# Patient Record
Sex: Female | Born: 1935
Health system: Southern US, Community
[De-identification: ages and names within clinical notes are randomized; demographics above are authoritative.]

## PROBLEM LIST (undated history)

## (undated) ENCOUNTER — Emergency Department (HOSPITAL_BASED_OUTPATIENT_CLINIC_OR_DEPARTMENT_OTHER)

## (undated) DIAGNOSIS — Z8701 Personal history of pneumonia (recurrent): Secondary | ICD-10-CM

## (undated) DIAGNOSIS — M545 Low back pain, unspecified: Secondary | ICD-10-CM

## (undated) DIAGNOSIS — Z800 Family history of malignant neoplasm of digestive organs: Secondary | ICD-10-CM

## (undated) DIAGNOSIS — H409 Unspecified glaucoma: Secondary | ICD-10-CM

## (undated) DIAGNOSIS — D126 Benign neoplasm of colon, unspecified: Secondary | ICD-10-CM

## (undated) DIAGNOSIS — J189 Pneumonia, unspecified organism: Secondary | ICD-10-CM

## (undated) DIAGNOSIS — I499 Cardiac arrhythmia, unspecified: Secondary | ICD-10-CM

## (undated) DIAGNOSIS — I2510 Atherosclerotic heart disease of native coronary artery without angina pectoris: Secondary | ICD-10-CM

## (undated) DIAGNOSIS — M1990 Unspecified osteoarthritis, unspecified site: Secondary | ICD-10-CM

## (undated) DIAGNOSIS — E7800 Pure hypercholesterolemia, unspecified: Secondary | ICD-10-CM

## (undated) DIAGNOSIS — D649 Anemia, unspecified: Secondary | ICD-10-CM

## (undated) DIAGNOSIS — M75101 Unspecified rotator cuff tear or rupture of right shoulder, not specified as traumatic: Secondary | ICD-10-CM

## (undated) DIAGNOSIS — A498 Other bacterial infections of unspecified site: Secondary | ICD-10-CM

## (undated) DIAGNOSIS — Z808 Family history of malignant neoplasm of other organs or systems: Secondary | ICD-10-CM

## (undated) DIAGNOSIS — H269 Unspecified cataract: Secondary | ICD-10-CM

## (undated) DIAGNOSIS — G709 Myoneural disorder, unspecified: Secondary | ICD-10-CM

## (undated) DIAGNOSIS — J471 Bronchiectasis with (acute) exacerbation: Secondary | ICD-10-CM

## (undated) DIAGNOSIS — M8580 Other specified disorders of bone density and structure, unspecified site: Secondary | ICD-10-CM

## (undated) DIAGNOSIS — I10 Essential (primary) hypertension: Secondary | ICD-10-CM

## (undated) DIAGNOSIS — K5730 Diverticulosis of large intestine without perforation or abscess without bleeding: Secondary | ICD-10-CM

## (undated) DIAGNOSIS — M4800 Spinal stenosis, site unspecified: Secondary | ICD-10-CM

## (undated) DIAGNOSIS — C50912 Malignant neoplasm of unspecified site of left female breast: Secondary | ICD-10-CM

## (undated) DIAGNOSIS — Z8049 Family history of malignant neoplasm of other genital organs: Secondary | ICD-10-CM

## (undated) DIAGNOSIS — I4891 Unspecified atrial fibrillation: Secondary | ICD-10-CM

## (undated) DIAGNOSIS — J449 Chronic obstructive pulmonary disease, unspecified: Secondary | ICD-10-CM

## (undated) DIAGNOSIS — M797 Fibromyalgia: Secondary | ICD-10-CM

## (undated) DIAGNOSIS — K219 Gastro-esophageal reflux disease without esophagitis: Secondary | ICD-10-CM

## (undated) DIAGNOSIS — C50919 Malignant neoplasm of unspecified site of unspecified female breast: Secondary | ICD-10-CM

## (undated) HISTORY — DX: Family history of malignant neoplasm of other organs or systems: Z80.8

## (undated) HISTORY — DX: Unspecified glaucoma: H40.9

## (undated) HISTORY — DX: Myoneural disorder, unspecified: G70.9

## (undated) HISTORY — DX: Other specified disorders of bone density and structure, unspecified site: M85.80

## (undated) HISTORY — DX: Pure hypercholesterolemia, unspecified: E78.00

## (undated) HISTORY — DX: Unspecified osteoarthritis, unspecified site: M19.90

## (undated) HISTORY — DX: Fibromyalgia: M79.7

## (undated) HISTORY — DX: Unspecified rotator cuff tear or rupture of right shoulder, not specified as traumatic: M75.101

## (undated) HISTORY — DX: Spinal stenosis, site unspecified: M48.00

## (undated) HISTORY — DX: Benign neoplasm of colon, unspecified: D12.6

## (undated) HISTORY — DX: Family history of malignant neoplasm of other genital organs: Z80.49

## (undated) HISTORY — DX: Diverticulosis of large intestine without perforation or abscess without bleeding: K57.30

## (undated) HISTORY — DX: Personal history of pneumonia (recurrent): Z87.01

## (undated) HISTORY — DX: Unspecified cataract: H26.9

## (undated) HISTORY — PX: TONSILLECTOMY: SUR1361

## (undated) HISTORY — DX: Atherosclerotic heart disease of native coronary artery without angina pectoris: I25.10

## (undated) HISTORY — DX: Unspecified atrial fibrillation: I48.91

## (undated) HISTORY — DX: Anemia, unspecified: D64.9

## (undated) HISTORY — DX: Gastro-esophageal reflux disease without esophagitis: K21.9

## (undated) HISTORY — PX: CATARACT EXTRACTION: SUR2

## (undated) HISTORY — DX: Family history of malignant neoplasm of digestive organs: Z80.0

## (undated) HISTORY — DX: Low back pain, unspecified: M54.50

## (undated) HISTORY — PX: POLYPECTOMY: SHX149

## (undated) HISTORY — PX: COLONOSCOPY: SHX174

## (undated) HISTORY — DX: Other bacterial infections of unspecified site: A49.8

## (undated) HISTORY — DX: Low back pain: M54.5

## (undated) HISTORY — DX: Bronchiectasis with (acute) exacerbation: J47.1

## (undated) HISTORY — DX: Malignant neoplasm of unspecified site of unspecified female breast: C50.919

---

## 1966-02-23 HISTORY — PX: LUNG BIOPSY: SHX232

## 1992-06-23 HISTORY — PX: OTHER SURGICAL HISTORY: SHX169

## 1997-09-24 ENCOUNTER — Other Ambulatory Visit: Admission: RE | Admit: 1997-09-24 | Discharge: 1997-09-24 | Payer: Self-pay | Admitting: *Deleted

## 1998-10-02 ENCOUNTER — Other Ambulatory Visit: Admission: RE | Admit: 1998-10-02 | Discharge: 1998-10-02 | Payer: Self-pay | Admitting: Obstetrics and Gynecology

## 1998-11-28 ENCOUNTER — Ambulatory Visit (HOSPITAL_COMMUNITY): Admission: RE | Admit: 1998-11-28 | Discharge: 1998-11-28 | Payer: Self-pay | Admitting: Pulmonary Disease

## 1998-11-28 ENCOUNTER — Encounter: Payer: Self-pay | Admitting: Pulmonary Disease

## 1999-10-01 ENCOUNTER — Other Ambulatory Visit: Admission: RE | Admit: 1999-10-01 | Discharge: 1999-10-01 | Payer: Self-pay | Admitting: Obstetrics and Gynecology

## 2000-08-31 ENCOUNTER — Ambulatory Visit (HOSPITAL_COMMUNITY): Admission: RE | Admit: 2000-08-31 | Discharge: 2000-08-31 | Payer: Self-pay | Admitting: Oncology

## 2000-08-31 ENCOUNTER — Encounter: Payer: Self-pay | Admitting: Oncology

## 2000-09-24 ENCOUNTER — Other Ambulatory Visit: Admission: RE | Admit: 2000-09-24 | Discharge: 2000-09-24 | Payer: Self-pay | Admitting: Obstetrics and Gynecology

## 2001-09-28 ENCOUNTER — Other Ambulatory Visit: Admission: RE | Admit: 2001-09-28 | Discharge: 2001-09-28 | Payer: Self-pay | Admitting: Obstetrics and Gynecology

## 2002-06-01 ENCOUNTER — Encounter: Payer: Self-pay | Admitting: Pulmonary Disease

## 2002-06-01 ENCOUNTER — Ambulatory Visit (HOSPITAL_COMMUNITY): Admission: RE | Admit: 2002-06-01 | Discharge: 2002-06-01 | Payer: Self-pay | Admitting: Pulmonary Disease

## 2002-06-06 ENCOUNTER — Ambulatory Visit: Admission: RE | Admit: 2002-06-06 | Discharge: 2002-06-06 | Payer: Self-pay | Admitting: Pulmonary Disease

## 2002-06-06 ENCOUNTER — Encounter (INDEPENDENT_AMBULATORY_CARE_PROVIDER_SITE_OTHER): Payer: Self-pay | Admitting: Specialist

## 2002-10-10 ENCOUNTER — Other Ambulatory Visit: Admission: RE | Admit: 2002-10-10 | Discharge: 2002-10-10 | Payer: Self-pay | Admitting: Obstetrics and Gynecology

## 2003-01-02 ENCOUNTER — Ambulatory Visit (HOSPITAL_COMMUNITY): Admission: RE | Admit: 2003-01-02 | Discharge: 2003-01-02 | Payer: Self-pay | Admitting: *Deleted

## 2003-06-01 ENCOUNTER — Ambulatory Visit: Admission: RE | Admit: 2003-06-01 | Discharge: 2003-06-01 | Payer: Self-pay | Admitting: Adult Health

## 2003-10-12 ENCOUNTER — Other Ambulatory Visit: Admission: RE | Admit: 2003-10-12 | Discharge: 2003-10-12 | Payer: Self-pay | Admitting: Obstetrics and Gynecology

## 2004-01-09 ENCOUNTER — Ambulatory Visit: Payer: Self-pay | Admitting: Pulmonary Disease

## 2004-01-29 ENCOUNTER — Ambulatory Visit: Payer: Self-pay | Admitting: Pulmonary Disease

## 2004-02-26 ENCOUNTER — Ambulatory Visit: Payer: Self-pay | Admitting: Pulmonary Disease

## 2004-03-07 ENCOUNTER — Ambulatory Visit: Payer: Self-pay | Admitting: Internal Medicine

## 2004-06-02 ENCOUNTER — Ambulatory Visit: Payer: Self-pay | Admitting: Pulmonary Disease

## 2004-07-25 ENCOUNTER — Ambulatory Visit: Payer: Self-pay | Admitting: Adult Health

## 2004-08-13 ENCOUNTER — Ambulatory Visit: Payer: Self-pay | Admitting: Pulmonary Disease

## 2004-08-19 ENCOUNTER — Ambulatory Visit: Payer: Self-pay | Admitting: Cardiology

## 2004-10-13 ENCOUNTER — Other Ambulatory Visit: Admission: RE | Admit: 2004-10-13 | Discharge: 2004-10-13 | Payer: Self-pay | Admitting: Obstetrics and Gynecology

## 2004-11-12 ENCOUNTER — Ambulatory Visit: Payer: Self-pay | Admitting: Pulmonary Disease

## 2004-11-17 ENCOUNTER — Ambulatory Visit: Payer: Self-pay | Admitting: Pulmonary Disease

## 2005-01-21 ENCOUNTER — Ambulatory Visit: Payer: Self-pay | Admitting: Pulmonary Disease

## 2005-03-10 ENCOUNTER — Ambulatory Visit: Payer: Self-pay | Admitting: Pulmonary Disease

## 2005-08-25 ENCOUNTER — Ambulatory Visit: Payer: Self-pay | Admitting: Pulmonary Disease

## 2005-08-31 ENCOUNTER — Ambulatory Visit: Payer: Self-pay | Admitting: Pulmonary Disease

## 2005-10-16 ENCOUNTER — Other Ambulatory Visit: Admission: RE | Admit: 2005-10-16 | Discharge: 2005-10-16 | Payer: Self-pay | Admitting: Obstetrics and Gynecology

## 2005-12-08 ENCOUNTER — Ambulatory Visit: Payer: Self-pay | Admitting: Pulmonary Disease

## 2006-03-03 ENCOUNTER — Ambulatory Visit: Payer: Self-pay | Admitting: Pulmonary Disease

## 2006-07-05 ENCOUNTER — Ambulatory Visit: Payer: Self-pay | Admitting: Pulmonary Disease

## 2006-07-05 LAB — CONVERTED CEMR LAB
Albumin: 3.8 g/dL (ref 3.5–5.2)
BUN: 11 mg/dL (ref 6–23)
Basophils Absolute: 0.1 10*3/uL (ref 0.0–0.1)
Basophils Relative: 0.9 % (ref 0.0–1.0)
CO2: 32 meq/L (ref 19–32)
Calcium: 9.5 mg/dL (ref 8.4–10.5)
Chloride: 103 meq/L (ref 96–112)
Creatinine, Ser: 0.5 mg/dL (ref 0.4–1.2)
Eosinophils Absolute: 0.2 10*3/uL (ref 0.0–0.6)
Eosinophils Relative: 2.7 % (ref 0.0–5.0)
GFR calc Af Amer: 156 mL/min
GFR calc non Af Amer: 129 mL/min
Glucose, Bld: 82 mg/dL (ref 70–99)
HCT: 38.8 % (ref 36.0–46.0)
Hemoglobin: 13.4 g/dL (ref 12.0–15.0)
Lymphocytes Relative: 23.1 % (ref 12.0–46.0)
MCHC: 34.5 g/dL (ref 30.0–36.0)
MCV: 95.7 fL (ref 78.0–100.0)
Monocytes Absolute: 0.5 10*3/uL (ref 0.2–0.7)
Monocytes Relative: 8.2 % (ref 3.0–11.0)
Neutro Abs: 3.9 10*3/uL (ref 1.4–7.7)
Neutrophils Relative %: 65.1 % (ref 43.0–77.0)
Phosphorus: 3.9 mg/dL (ref 2.3–4.6)
Platelets: 238 10*3/uL (ref 150–400)
Potassium: 4.2 meq/L (ref 3.5–5.1)
RBC: 4.06 M/uL (ref 3.87–5.11)
RDW: 12.3 % (ref 11.5–14.6)
Sed Rate: 39 mm/hr — ABNORMAL HIGH (ref 0–25)
Sodium: 138 meq/L (ref 135–145)
WBC: 6.1 10*3/uL (ref 4.5–10.5)

## 2006-07-06 ENCOUNTER — Encounter: Payer: Self-pay | Admitting: Pulmonary Disease

## 2006-09-01 ENCOUNTER — Ambulatory Visit: Payer: Self-pay | Admitting: Pulmonary Disease

## 2006-11-03 ENCOUNTER — Ambulatory Visit: Payer: Self-pay | Admitting: Pulmonary Disease

## 2006-12-08 ENCOUNTER — Ambulatory Visit: Payer: Self-pay | Admitting: Pulmonary Disease

## 2007-03-04 DIAGNOSIS — D649 Anemia, unspecified: Secondary | ICD-10-CM | POA: Insufficient documentation

## 2007-03-04 DIAGNOSIS — J309 Allergic rhinitis, unspecified: Secondary | ICD-10-CM | POA: Insufficient documentation

## 2007-03-04 DIAGNOSIS — M1990 Unspecified osteoarthritis, unspecified site: Secondary | ICD-10-CM | POA: Insufficient documentation

## 2007-03-04 DIAGNOSIS — J479 Bronchiectasis, uncomplicated: Secondary | ICD-10-CM | POA: Insufficient documentation

## 2007-03-07 ENCOUNTER — Ambulatory Visit: Payer: Self-pay | Admitting: Pulmonary Disease

## 2007-03-07 DIAGNOSIS — M899 Disorder of bone, unspecified: Secondary | ICD-10-CM | POA: Insufficient documentation

## 2007-03-07 DIAGNOSIS — M67919 Unspecified disorder of synovium and tendon, unspecified shoulder: Secondary | ICD-10-CM | POA: Insufficient documentation

## 2007-03-07 DIAGNOSIS — C50919 Malignant neoplasm of unspecified site of unspecified female breast: Secondary | ICD-10-CM | POA: Insufficient documentation

## 2007-03-07 DIAGNOSIS — M949 Disorder of cartilage, unspecified: Secondary | ICD-10-CM

## 2007-03-07 DIAGNOSIS — M719 Bursopathy, unspecified: Secondary | ICD-10-CM

## 2007-03-07 DIAGNOSIS — J189 Pneumonia, unspecified organism: Secondary | ICD-10-CM | POA: Insufficient documentation

## 2007-03-07 DIAGNOSIS — K5730 Diverticulosis of large intestine without perforation or abscess without bleeding: Secondary | ICD-10-CM | POA: Insufficient documentation

## 2007-03-18 ENCOUNTER — Ambulatory Visit: Payer: Self-pay | Admitting: Pulmonary Disease

## 2007-03-18 DIAGNOSIS — J479 Bronchiectasis, uncomplicated: Secondary | ICD-10-CM | POA: Insufficient documentation

## 2007-03-18 DIAGNOSIS — R042 Hemoptysis: Secondary | ICD-10-CM | POA: Insufficient documentation

## 2007-03-18 DIAGNOSIS — J471 Bronchiectasis with (acute) exacerbation: Secondary | ICD-10-CM | POA: Insufficient documentation

## 2007-03-21 ENCOUNTER — Encounter: Payer: Self-pay | Admitting: Pulmonary Disease

## 2007-03-23 ENCOUNTER — Telehealth: Payer: Self-pay | Admitting: Adult Health

## 2007-05-02 ENCOUNTER — Telehealth (INDEPENDENT_AMBULATORY_CARE_PROVIDER_SITE_OTHER): Payer: Self-pay | Admitting: *Deleted

## 2007-06-28 ENCOUNTER — Telehealth: Payer: Self-pay | Admitting: Pulmonary Disease

## 2007-06-29 ENCOUNTER — Ambulatory Visit: Payer: Self-pay | Admitting: Pulmonary Disease

## 2007-06-29 LAB — CONVERTED CEMR LAB: Vit D, 1,25-Dihydroxy: 39 (ref 30–89)

## 2007-07-06 ENCOUNTER — Ambulatory Visit: Payer: Self-pay | Admitting: Pulmonary Disease

## 2007-07-06 LAB — CONVERTED CEMR LAB
ALT: 20 units/L (ref 0–35)
AST: 23 units/L (ref 0–37)
Albumin: 3.7 g/dL (ref 3.5–5.2)
Alkaline Phosphatase: 56 units/L (ref 39–117)
BUN: 11 mg/dL (ref 6–23)
Basophils Absolute: 0 10*3/uL (ref 0.0–0.1)
Basophils Relative: 0.9 % (ref 0.0–1.0)
Bilirubin, Direct: 0.1 mg/dL (ref 0.0–0.3)
CO2: 32 meq/L (ref 19–32)
Calcium: 9.2 mg/dL (ref 8.4–10.5)
Chloride: 105 meq/L (ref 96–112)
Cholesterol: 178 mg/dL (ref 0–200)
Creatinine, Ser: 0.6 mg/dL (ref 0.4–1.2)
Eosinophils Absolute: 0.2 10*3/uL (ref 0.0–0.7)
Eosinophils Relative: 4.8 % (ref 0.0–5.0)
GFR calc Af Amer: 126 mL/min
GFR calc non Af Amer: 104 mL/min
Glucose, Bld: 91 mg/dL (ref 70–99)
HCT: 39.2 % (ref 36.0–46.0)
HDL: 47.4 mg/dL (ref >39.0)
Hemoglobin: 13.6 g/dL (ref 12.0–15.0)
LDL Cholesterol: 116 mg/dL — ABNORMAL HIGH (ref 0–99)
Lymphocytes Relative: 26.1 % (ref 12.0–46.0)
MCHC: 34.6 g/dL (ref 30.0–36.0)
MCV: 96.1 fL (ref 78.0–100.0)
Monocytes Absolute: 0.4 10*3/uL (ref 0.1–1.0)
Monocytes Relative: 8.9 % (ref 3.0–12.0)
Neutro Abs: 2.4 10*3/uL (ref 1.4–7.7)
Neutrophils Relative %: 59.3 % (ref 43.0–77.0)
Platelets: 228 10*3/uL (ref 150–400)
Potassium: 4.8 meq/L (ref 3.5–5.1)
RBC: 4.08 M/uL (ref 3.87–5.11)
RDW: 11.8 % (ref 11.5–14.6)
Sed Rate: 35 mm/hr — ABNORMAL HIGH (ref 0–22)
Sodium: 139 meq/L (ref 135–145)
TSH: 4.66 microintl units/mL (ref 0.35–5.50)
Total Bilirubin: 1.1 mg/dL (ref 0.3–1.2)
Total CHOL/HDL Ratio: 3.8
Total Protein: 7.1 g/dL (ref 6.0–8.3)
Triglycerides: 73 mg/dL (ref 0–149)
VLDL: 15 mg/dL (ref 0–40)
WBC: 4.1 10*3/uL — ABNORMAL LOW (ref 4.5–10.5)

## 2007-11-07 ENCOUNTER — Ambulatory Visit: Payer: Self-pay | Admitting: Pulmonary Disease

## 2007-11-30 ENCOUNTER — Ambulatory Visit: Payer: Self-pay | Admitting: Pulmonary Disease

## 2008-02-29 ENCOUNTER — Encounter: Payer: Self-pay | Admitting: Pulmonary Disease

## 2008-03-14 ENCOUNTER — Encounter: Payer: Self-pay | Admitting: Pulmonary Disease

## 2008-03-26 ENCOUNTER — Encounter: Payer: Self-pay | Admitting: Pulmonary Disease

## 2008-03-28 ENCOUNTER — Encounter: Payer: Self-pay | Admitting: Pulmonary Disease

## 2008-05-07 ENCOUNTER — Ambulatory Visit: Payer: Self-pay | Admitting: Pulmonary Disease

## 2008-05-07 DIAGNOSIS — M545 Low back pain, unspecified: Secondary | ICD-10-CM | POA: Insufficient documentation

## 2008-09-25 ENCOUNTER — Ambulatory Visit: Payer: Self-pay | Admitting: Pulmonary Disease

## 2008-09-25 ENCOUNTER — Telehealth: Payer: Self-pay | Admitting: Pulmonary Disease

## 2008-09-26 ENCOUNTER — Encounter: Payer: Self-pay | Admitting: Pulmonary Disease

## 2008-10-30 ENCOUNTER — Telehealth: Payer: Self-pay | Admitting: Pulmonary Disease

## 2008-10-31 ENCOUNTER — Ambulatory Visit: Payer: Self-pay | Admitting: Pulmonary Disease

## 2008-11-06 ENCOUNTER — Ambulatory Visit: Payer: Self-pay | Admitting: Pulmonary Disease

## 2008-11-18 LAB — CONVERTED CEMR LAB
ALT: 17 units/L (ref 0–35)
AST: 28 units/L (ref 0–37)
Albumin: 3.7 g/dL (ref 3.5–5.2)
Alkaline Phosphatase: 60 units/L (ref 39–117)
BUN: 11 mg/dL (ref 6–23)
Basophils Absolute: 0 10*3/uL (ref 0.0–0.1)
Basophils Relative: 0.4 % (ref 0.0–3.0)
Bilirubin, Direct: 0.1 mg/dL (ref 0.0–0.3)
CO2: 31 meq/L (ref 19–32)
Calcium: 9.2 mg/dL (ref 8.4–10.5)
Chloride: 103 meq/L (ref 96–112)
Cholesterol: 174 mg/dL (ref 0–200)
Creatinine, Ser: 0.5 mg/dL (ref 0.4–1.2)
Eosinophils Absolute: 0.2 10*3/uL (ref 0.0–0.7)
Eosinophils Relative: 5.6 % — ABNORMAL HIGH (ref 0.0–5.0)
GFR calc non Af Amer: 128.38 mL/min (ref >60)
Glucose, Bld: 93 mg/dL (ref 70–99)
HCT: 39 % (ref 36.0–46.0)
HDL: 49 mg/dL (ref >39.00)
Hemoglobin: 13.6 g/dL (ref 12.0–15.0)
LDL Cholesterol: 112 mg/dL — ABNORMAL HIGH (ref 0–99)
Lymphocytes Relative: 23.7 % (ref 12.0–46.0)
Lymphs Abs: 1 10*3/uL (ref 0.7–4.0)
MCHC: 34.7 g/dL (ref 30.0–36.0)
MCV: 96.3 fL (ref 78.0–100.0)
Monocytes Absolute: 0.4 10*3/uL (ref 0.1–1.0)
Monocytes Relative: 10.3 % (ref 3.0–12.0)
Neutro Abs: 2.7 10*3/uL (ref 1.4–7.7)
Neutrophils Relative %: 60 % (ref 43.0–77.0)
Platelets: 225 10*3/uL (ref 150.0–400.0)
Potassium: 4.9 meq/L (ref 3.5–5.1)
RBC: 4.05 M/uL (ref 3.87–5.11)
RDW: 11.9 % (ref 11.5–14.6)
Sed Rate: 45 mm/hr — ABNORMAL HIGH (ref 0–22)
Sodium: 138 meq/L (ref 135–145)
TSH: 3.51 microintl units/mL (ref 0.35–5.50)
Total Bilirubin: 1 mg/dL (ref 0.3–1.2)
Total CHOL/HDL Ratio: 4
Total Protein: 7.2 g/dL (ref 6.0–8.3)
Triglycerides: 64 mg/dL (ref 0.0–149.0)
VLDL: 12.8 mg/dL (ref 0.0–40.0)
Vit D, 25-Hydroxy: 42 ng/mL (ref 30–89)
WBC: 4.3 10*3/uL — ABNORMAL LOW (ref 4.5–10.5)

## 2008-11-30 ENCOUNTER — Ambulatory Visit: Payer: Self-pay | Admitting: Pulmonary Disease

## 2008-12-28 ENCOUNTER — Telehealth (INDEPENDENT_AMBULATORY_CARE_PROVIDER_SITE_OTHER): Payer: Self-pay | Admitting: *Deleted

## 2009-04-03 ENCOUNTER — Encounter: Payer: Self-pay | Admitting: Pulmonary Disease

## 2009-05-06 ENCOUNTER — Ambulatory Visit: Payer: Self-pay | Admitting: Pulmonary Disease

## 2009-06-04 ENCOUNTER — Telehealth: Payer: Self-pay | Admitting: Gastroenterology

## 2009-06-17 ENCOUNTER — Encounter (INDEPENDENT_AMBULATORY_CARE_PROVIDER_SITE_OTHER): Payer: Self-pay | Admitting: *Deleted

## 2009-06-19 ENCOUNTER — Ambulatory Visit: Payer: Self-pay | Admitting: Gastroenterology

## 2009-07-03 ENCOUNTER — Ambulatory Visit: Payer: Self-pay | Admitting: Gastroenterology

## 2009-10-23 ENCOUNTER — Telehealth (INDEPENDENT_AMBULATORY_CARE_PROVIDER_SITE_OTHER): Payer: Self-pay | Admitting: *Deleted

## 2009-10-29 ENCOUNTER — Ambulatory Visit: Payer: Self-pay | Admitting: Pulmonary Disease

## 2009-11-04 ENCOUNTER — Ambulatory Visit: Payer: Self-pay | Admitting: Pulmonary Disease

## 2009-11-04 DIAGNOSIS — N3000 Acute cystitis without hematuria: Secondary | ICD-10-CM | POA: Insufficient documentation

## 2009-11-04 DIAGNOSIS — E785 Hyperlipidemia, unspecified: Secondary | ICD-10-CM | POA: Insufficient documentation

## 2009-11-04 LAB — CONVERTED CEMR LAB
ALT: 17 units/L (ref 0–35)
AST: 25 units/L (ref 0–37)
Albumin: 3.7 g/dL (ref 3.5–5.2)
Alkaline Phosphatase: 70 units/L (ref 39–117)
BUN: 14 mg/dL (ref 6–23)
Basophils Absolute: 0 10*3/uL (ref 0.0–0.1)
Basophils Relative: 0.4 % (ref 0.0–3.0)
Bilirubin Urine: NEGATIVE
Bilirubin, Direct: 0.1 mg/dL (ref 0.0–0.3)
CO2: 31 meq/L (ref 19–32)
Calcium: 9.1 mg/dL (ref 8.4–10.5)
Chloride: 100 meq/L (ref 96–112)
Cholesterol: 175 mg/dL (ref 0–200)
Creatinine, Ser: 0.5 mg/dL (ref 0.4–1.2)
Eosinophils Absolute: 0.2 10*3/uL (ref 0.0–0.7)
Eosinophils Relative: 4.1 % (ref 0.0–5.0)
GFR calc non Af Amer: 128.03 mL/min (ref >60)
Glucose, Bld: 87 mg/dL (ref 70–99)
HCT: 38.5 % (ref 36.0–46.0)
HDL: 47.6 mg/dL (ref >39.00)
Hemoglobin: 13.2 g/dL (ref 12.0–15.0)
Ketones, ur: NEGATIVE mg/dL
LDL Cholesterol: 115 mg/dL — ABNORMAL HIGH (ref 0–99)
Lymphocytes Relative: 15.9 % (ref 12.0–46.0)
Lymphs Abs: 0.8 10*3/uL (ref 0.7–4.0)
MCHC: 34.3 g/dL (ref 30.0–36.0)
MCV: 95.9 fL (ref 78.0–100.0)
Monocytes Absolute: 0.5 10*3/uL (ref 0.1–1.0)
Monocytes Relative: 8.9 % (ref 3.0–12.0)
Neutro Abs: 3.8 10*3/uL (ref 1.4–7.7)
Neutrophils Relative %: 70.7 % (ref 43.0–77.0)
Nitrite: POSITIVE
Platelets: 273 10*3/uL (ref 150.0–400.0)
Potassium: 5.2 meq/L — ABNORMAL HIGH (ref 3.5–5.1)
RBC: 4.02 M/uL (ref 3.87–5.11)
RDW: 12.1 % (ref 11.5–14.6)
Sed Rate: 67 mm/hr — ABNORMAL HIGH (ref 0–22)
Sodium: 138 meq/L (ref 135–145)
Specific Gravity, Urine: 1.01 (ref 1.000–1.030)
TSH: 2.44 microintl units/mL (ref 0.35–5.50)
Total Bilirubin: 0.6 mg/dL (ref 0.3–1.2)
Total CHOL/HDL Ratio: 4
Total Protein, Urine: NEGATIVE mg/dL
Total Protein: 7.4 g/dL (ref 6.0–8.3)
Triglycerides: 61 mg/dL (ref 0.0–149.0)
Urine Glucose: NEGATIVE mg/dL
Urobilinogen, UA: 0.2 (ref 0.0–1.0)
VLDL: 12.2 mg/dL (ref 0.0–40.0)
Vit D, 25-Hydroxy: 34 ng/mL (ref 30–89)
WBC: 5.3 10*3/uL (ref 4.5–10.5)
pH: 7 (ref 5.0–8.0)

## 2009-12-03 ENCOUNTER — Encounter: Payer: Self-pay | Admitting: Pulmonary Disease

## 2009-12-09 ENCOUNTER — Encounter: Payer: Self-pay | Admitting: Pulmonary Disease

## 2009-12-18 ENCOUNTER — Encounter: Payer: Self-pay | Admitting: Pulmonary Disease

## 2010-02-23 HISTORY — PX: LUNG BIOPSY: SHX232

## 2010-03-19 ENCOUNTER — Telehealth: Payer: Self-pay | Admitting: Pulmonary Disease

## 2010-03-25 NOTE — Assessment & Plan Note (Signed)
Summary: BLOOD IN SPUTUM/ MBW  Medications Added VAGIFEM 25 MCG  TABS (ESTRADIOL) twice a week AVELOX 400 MG  TABS (MOXIFLOXACIN HCL) 1 by mouth once daily        Chief Complaint:  c/o hemoptysis when doing "postural draining" x3 days and cough with some bloody sputum.  History of Present Illness: 77 year old female patient with a known history of bronchiectasis presents today for an acute office visit.  Patient complains over  the last week she has had  increased congestion.  For the last 3 days during her postural drainage.  She has noticed some blood-tinged sputum.  She denies any chest pain, orthopnea, PND, leg swelling, or fever.   Current Allergies (reviewed today): No known allergies  Updated/Current Medications (including changes made in today's visit):  ADULT ASPIRIN EC LOW STRENGTH 81 MG  TBEC (ASPIRIN) Take 1 tablet by mouth once a day ADVAIR DISKUS 250-50 MCG/DOSE  MISC (FLUTICASONE-SALMETEROL) use one puff two times a day rinse mouth after each use MUCINEX MAXIMUM STRENGTH 1200 MG  TB12 (GUAIFENESIN) take one tablet by mouth two times a day COQ10 30 MG  CAPS (COENZYME Q10) take one capsule by mouth every day MULTIVITAMINS   TABS (MULTIPLE VITAMIN) Take 1 tablet by mouth once a day BORAGE OIL 1000 MG  CAPS (BORAGE (BORAGO OFFICINALIS)) Take 1 tablet by mouth once a day GLUCOSAMINE-CHONDROITIN 500-400 MG  CAPS (GLUCOSAMINE-CHONDROITIN) Take 1 tablet by mouth once a day CALTRATE 600 1500 MG  TABS (CALCIUM CARBONATE) Take 1 tablet by mouth once a day VAGIFEM 25 MCG  TABS (ESTRADIOL) twice a week AVELOX 400 MG  TABS (MOXIFLOXACIN HCL) 1 by mouth once daily      Review of Systems      See HPI   Vital Signs:  Patient Profile:   77 Years Old Female Weight:      147.50 pounds O2 Sat:      97 % O2 treatment:    Room Air Temp:     97.8 degrees F oral Pulse rate:   88 / minute BP sitting:   112 / 64  (left arm) Cuff size:   regular  Vitals Entered By: Boone Master  CNA (March 18, 2007 3:35 PM)             Comments Medications reviewed with patient ..................................................................Marland KitchenBoone Master CNA  March 18, 2007 3:35 PM      Physical Exam  No acute distress with stable vital signs. HEENT:  Unremarkable.  Oropharynx is clear. LUNG FIELDS:  Coarse breath sounds with scattered rhonchi. HEART:  There is a regular rhythm without murmur, gallop, rub. ABDOMEN:  Soft, non-tender EXTREMITIES:  Warm without calf tenderness, cyanosis, clubbing, edema.      X-ray  Procedure date:  03/18/2007  Findings:      advanced chronic lung disease.  Overall no significant change from the prior study.     Impression & Recommendations:  Problem # 1:  HEMOPTYSIS (ICD-786.3) Hold aspirin for 5 days. Mucinex DM two times a day as needed for cough/congestion. .follow up as scheduled and as needed Please contact office for sooner follow up if symptoms do not improve or worsen  Her updated medication list for this problem includes:    Advair Diskus 250-50 Mcg/dose Misc (Fluticasone-salmeterol) ..... Use one puff two times a day rinse mouth after each use    Multivitamins Tabs (Multiple vitamin) .Marland Kitchen... Take 1 tablet by mouth once a day    Caltrate 600 1500 Mg Tabs (Calcium  carbonate) .Marland Kitchen... Take 1 tablet by mouth once a day    Avelox 400 Mg Tabs (Moxifloxacin hcl) .Marland Kitchen... 1 by mouth once daily  Orders: T-2 View CXR, Same Day (71020.5TC) Est. Patient Level IV (10272)   Problem # 2:  BRONCHIECTASIS WITH ACUTE EXACERBATION (ICD-494.1) Continue mucinex two times a day. Avelox 400mg  once daily for 7 days on hold if symptoms worsen with discolored mucus follow up as scheduled and as needed Please contact office for sooner follow up if symptoms do not improve or worsen   Orders: T-2 View CXR, Same Day (71020.5TC) Est. Patient Level IV (53664)   Medications Added to Medication List This Visit: 1)  Vagifem 25 Mcg Tabs  (Estradiol) .... Twice a week 2)  Avelox 400 Mg Tabs (Moxifloxacin hcl) .Marland Kitchen.. 1 by mouth once daily   Patient Instructions: 1)  Please schedule a follow-up appointment in 3 months Dr. Kriste Basque  2)  Please contact office for sooner follow up if symptoms do not improve or worsen  3)  Hold Aspirin for 5 days 4)  Continue mucinex two times a day  5)  Avelox 400mg  once daily for 7 days on hold if symptoms worsen with discolored mucus    Prescriptions: AVELOX 400 MG  TABS (MOXIFLOXACIN HCL) 1 by mouth once daily  #7 x 0   Entered and Authorized by:   Rubye Oaks NP   Signed by:   Ryane Canavan NP on 03/18/2007   Method used:   Electronically sent to ...       [facility]  [address] [id number]       [address]       [place], [place]  [address]       Ph: [phone] or [phone]       Fax: [phone]   RxID:   [id number]  ]

## 2010-03-25 NOTE — Miscellaneous (Signed)
Summary: Flu [facility]  Flu Vacc[facility]   Imported By: Esmeralda Links D'jimraou 12/14/2009 10:34:05  _____________________________________________________________________  External Attachment:    Type:   Image     Comment:   External Document

## 2010-03-25 NOTE — Assessment & Plan Note (Signed)
Summary: 6 months/apc   CC:  6 month ROV & review of mult medical problems....  History of Present Illness: 77 y/o WF here for a follow up visit... she has mult med problems as noted below...    ~  May 07, 2008:  she has chr bronchiectasis & stable over the last few yrs w/o change in cough, phlegm, CP, SOB, etc... she walks 4 mi daily and does her postural drainage etc- producing a small amt of green sputum daily... she stopped her jogging when she developed LBP and eval by [facility] w/ MRI showed herniated disc L3-4 w/ mod spinal stenosis and lat recess stenosis bilat... incidental left adnexal cyst measures <2cm & she will check w/ GYN about this... she was treated w/ dosepak and Advil and improved!   ~  November 06, 2008:  she had one epis hemoptysis 8/10- C/S= NTF, & AFB= neg;  Rx'd Avelox & improved... notes mild LLQ discomfort intermittently over the last week, sl constip, no blood, no f/c/s... I have rec CT Abd but she wants to wait & will call if symptoms persist.    Current Problem List:  ALLERGIC RHINITIS (ICD-477.9) - she uses OTC antihistamines Prn.  BRONCHIECTASIS (ICD-494.0) & Hx of PNEUMONIA (ICD-486) - on ADVAIR 250 (only once daily now), & MUCINEX 1-2Bid...  Long hx severe bronchiectasis, granulomatous lung dis, and pneumonia (initially Dx 1968 by DrFrazier, subseq bx= noncaseating gran dz & chr inflam)... her last bronchoscopy was 4/04 revealing some mucus plugging... CTChest 4/04 showed marked bronchiectasis in lingular, LLL, RML, and mucus plugging... CT repeated 6/06- similar.  ~  Sputum cultures 5/08 grew Serratia (R to amox/ceph, S to all others), branching part acid fast bacilli resembling nocardia, yeast c/w candida...  ~  CXR 1/09 chr lung dis, no change from prev w/ left mid lung zone as the worst area...  ~  CXR 3/10 w/ chr lung changes and NAD suspected...  Hx of ADENOCARCINOMA, BREAST (ICD-174.9) - S/P surgery 5/94 [facility] w/ wide excision L breast lesion and  LN dissection (stage II 1.0  cm tumor), w/ post op XRT and chemotherapy... she was followed by [facility] for oncology... then DrLivesay (released in 2003).  ~  routine Mammogram 2/10 was neg- f/u planned 15yr.  DIVERTICULOSIS OF COLON (ICD-562.10) - last colonoscopy 3/05 by DrPatterson showed divertics only... she has +fam hx colon ca w/ colon check q59yrs.  DEGENERATIVE JOINT DISEASE (ICD-715.90)  LOW BACK PAIN SYNDROME (ICD-724.2) - ** see above **  ROTATOR CUFF SYNDROME, RIGHT (ICD-726.10) - had injection by [facility] 10/07 w/ improvement.  OSTEOPENIA (ICD-733.90) - last BMD at [facility] 10/06 showed norm spine, norm hip, osteopenic forearm (-2.0)... followed by GYN = DrRomaine who is treating a low Vit D level... she takes calcium, vitamins, & Vit D (50000 u weekly) from [place]...  ~  labs 9/10 showed Vit D level = 42... rec> change to 1000 u daily.  Hx of ANEMIA (ICD-285.9) -  resolved...  ~  labs 9/10 showed Hg= 13.6  ECZEMA - she has hx of eczema on her hands and has used CoQ10 and Borage Oil as rec by an Dentist in [place]... recently changed to [facility] Current instead of these other supplements & rash is much improved...    Allergies (verified): No Known Drug Allergies  Comments:  Nurse/Medical Assistant: The patient's medications and allergies were reviewed with the patient and were updated in the Medication and Allergy Lists.  Past History:  Past Medical History: ALLERGIC RHINITIS (ICD-477.9) BRONCHIECTASIS  WITH ACUTE EXACERBATION (ICD-494.1) HEMOPTYSIS (ICD-786.3) BRONCHIECTASIS (ICD-494.0) Hx of PNEUMONIA (ICD-486) Hx of ADENOCARCINOMA, BREAST (ICD-174.9) DIVERTICULOSIS OF COLON (ICD-562.10) DEGENERATIVE JOINT DISEASE (ICD-715.90) LOW BACK PAIN SYNDROME (ICD-724.2) ROTATOR CUFF SYNDROME, RIGHT (ICD-726.10) OSTEOPENIA (ICD-733.90) ANEMIA (ICD-285.9)  Family History: Reviewed history from 05/07/2008 and no changes required. Mother deceased age 78 from heart  attack Father deceased age 22 from colon cancer 1 Sibling alive age 37  hx of HBP, high triglycerids  Social History: Reviewed history from 11/07/2007 and no changes required. quit smoking in 1969  smoked for 40 years exercises 6 times per week no caffeine use etoh--1 drink per week married 5 children  Review of Systems      See HPI       The patient complains of prolonged cough.  The patient denies anorexia, fever, weight loss, weight gain, vision loss, decreased hearing, hoarseness, chest pain, syncope, dyspnea on exertion, peripheral edema, headaches, hemoptysis, abdominal pain, melena, hematochezia, severe indigestion/heartburn, hematuria, incontinence, muscle weakness, suspicious skin lesions, transient blindness, difficulty walking, depression, unusual weight change, abnormal bleeding, enlarged lymph nodes, and angioedema.    Vital Signs:  Patient profile:   77 year old female Height:      64 inches Weight:      145 pounds BMI:     24.98 O2 Sat:      95 % on Room air Temp:     96.8 degrees F oral Pulse rate:   98 / minute BP sitting:   138 / 64  (right arm) Cuff size:   regular  Vitals Entered By: Marijo File CMA (November 06, 2008 12:11 PM)  O2 Sat at Rest %:  95 O2 Flow:  Room air CC: 6 month ROV & review of mult medical problems... Comments meds updated today   Physical Exam  Additional Exam:  WD, WN, 77 y/o WF in NAD... GENERAL:  Alert & oriented; pleasant & cooperative... HEENT:  [place]/AT, EOM-wnl, PERRLA, EACs-clear, TMs-wnl, NOSE-clear, THROAT-clear & wnl. NECK:  Supple w/ fairROM; no JVD; normal carotid impulses w/o bruits; no thyromegaly or nodules palpated; no lymphadenopathy. CHEST:  scat bilat rhonchi without wheezing, rales or signs of consolidation... HEART:  Regular Rhythm; without murmurs/ rubs/ or gallops heard... ABDOMEN:  Soft & nontender; normal bowel sounds; no organomegaly or masses detected. EXT: without deformities or arthritic changes; no  varicose veins/ venous insuffic/ or edema. NEURO:  CN's intact;  no focal neuro deficits... DERM:  No lesions noted; no rash etc...     MISC. Report  Procedure date:  10/31/2008  Findings:      Tests: (1) BMP (METABOL)   Sodium                    138 mEq/L                   135-145   Potassium                 4.9 mEq/L                   3.5-5.1   Chloride                  103 mEq/L                   96-112   Carbon Dioxide            31 mEq/L  19-32   Glucose                   93 mg/dL                    09-81   BUN                       11 mg/dL                    1-91   Creatinine                0.5 mg/dL                   4.7-8.2   Calcium                   9.2 mg/dL                   9.5-62.1   GFR                       128.38 mL/min               >60  Tests: (2) Lipid Panel (LIPID)   Cholesterol               174 mg/dL                   3-086   Triglycerides             64.0 mg/dL                  5.7-846.9   HDL                       62.95 mg/dL                 >28.41   VLDL Cholesterol          12.8 mg/dL                  3.2-44.0   LDL Cholesterol      [H]  102 mg/dL                   7-25  Tests: (3) CBC Platelet w/Diff (CBCD)   White Cell Count     [L]  4.3 K/uL                    4.5-10.5   Red Cell Count            4.05 Mil/uL                 3.87-5.11   Hemoglobin                13.6 g/dL                   36.6-44.0   Hematocrit                39.0 %                      36.0-46.0   MCV                       96.3 fl  78.0-100.   Platelet Count            225.0 K/uL                  150.0-400.0   Neutrophil %              60.0 %                      43.0-77.0   Lymphocyte %              23.7 %                      12.0-46.0   Monocyte %                10.3 %                      3.0-12.0   Eosinophils%         [H]  5.6 %                       0.0-5.0   Basophils %               0.4 %                       0.0-3.0   Comments:       Tests: (4) Hepatic/Liver Function Panel (HEPATIC)   Total Bilirubin           1.0 mg/dL                   1.6-1.0   Direct Bilirubin          0.1 mg/dL                   9.6-0.4   Alkaline Phosphatase      60 U/L                      39-117   AST                       28 U/L                      0-37   ALT                       17 U/L                      0-35   Total Protein             7.2 g/dL                    5.4-0.9   Albumin                   3.7 g/dL                    8.1-1.9  Tests: (5) TSH (TSH)   FastTSH                   3.51 uIU/mL                 0.35-5.50  Tests: (6) Sed Rate (ESR)   Sed Rate             [  H]  45 mm/hr                    0-22    Impression & Recommendations:  Problem # 1:  BRONCHIECTASIS WITH ACUTE EXACERBATION (ICD-494.1) She has severe bronchiectasis but has managed very well w/ her regimen...  REC>  continue same.  Problem # 2:  Hx of ADENOCARCINOMA, BREAST (ICD-174.9) Stable-  no known recurrence...  Problem # 3:  DIVERTICULOSIS OF COLON (ICD-562.10) GI is stable... we discussed checking CT but she wants to wait & will call if symptoms persist...  Problem # 4:  OSTEOPENIA (ICD-733.90) Aware-  continue meds.  Problem # 5:  OTHER MEDICAL PROBLEMS AS NOTED>>>  Complete Medication List: 1)  Aspirin Adult Low Strength 81 Mg Tbec (Aspirin) .... Take 1 tablet by mouth once a day 2)  Advair Diskus 250-50 Mcg/dose Misc (Fluticasone-salmeterol) .... Use one puff once daily    rinse mouth after each use 3)  Mucinex Maximum Strength 1200 Mg Tb12 (Guaifenesin) .... Take one tablet by mouth once daily 4)  Vagifem 25 Mcg Tabs (Estradiol) .... Once a week 5)  Caltrate 600 1500 Mg Tabs (Calcium carbonate) .... Take 1 tablet by mouth once a day 6)  Glucosamine-chondroitin 500-400 Mg Caps (Glucosamine-chondroitin) .... Take 1 tablet by mouth once a day 7)  Multivitamins Tabs (Multiple vitamin) .... Take 1 tablet by mouth once a day 8)  Black Current  1000mg   .... Take 1 tablet by mouth once a day 9)  Vitamin D 57846 Unit Caps (Ergocalciferol) .... Once weekly 10)  Aleve 220 Mg Tabs (Naproxen sodium) .... As needed  Patient Instructions: 1)  Today we updated your med list- see below.... 2)  Continue your current meds the same... 3)  Call for any problems.Marland KitchenMarland Kitchen 4)  Please schedule a follow-up appointment in 6 months.

## 2010-03-25 NOTE — Progress Notes (Signed)
Summary: NEED LABS DONE   Phone Note Call from Patient Call back at     Caller: Patient Call For: Miranda Thompson Summary of Call: NEED LABS PUT IN BEFORE APP ON 07/06/07 CALL WHEN DONE  Follow-up for Phone Call        Please advise which labs to be ord and ill be happy to put in idx.  Thanks! Follow-up by: Vernie Murders,  Jun 28, 2007 11:29 AM  Additional Follow-up for Phone Call Additional follow up Details #1::        labs are in computer per sn and pt is aware of this. Additional Follow-up by: Marijo File CMA,  Jun 28, 2007 5:05 PM

## 2010-03-25 NOTE — Progress Notes (Signed)
Summary: reults   Phone Note Call from Patient Call back at Home Phone [phone]   Caller: Patient Call For: nadel/Alida Greiner p Reason for Call: Talk to Nurse, Lab or Test Results Summary of Call: pt called wanting to get results of xray done on 03/18/2007 Initial call taken by: Eugene Gavia,  March 23, 2007 2:36 PM  Follow-up for Phone Call        chronic changes on x-ray , follow up as scheduled may need to repeat CT chest if sx do not improve.  LMOM Follow-up by: Raegan Sipp NP,  March 24, 2007 9:27 AM

## 2010-03-25 NOTE — Assessment & Plan Note (Signed)
Summary: follow up/ mbw      Allergies Added: NKDA  Chief Complaint:  4 month ROV....  History of Present Illness: 77 y/o WF here for a 4 month follow up visit... she notes rare episodes of palpit and lightheadedness... no syncope, CP, change in dyspnea, etc... she walks 4 miles daily and does some light wt lifting etc... she has severe bronchiectasis and copes very well- doing regular postural drainage which is prod of mod thick yellow-green phlegm... last pos culture was 5/08 w/ serratia and candida... she called w/ incr phlegm over the holidays and took CIPRO that was called in.  Current Problems:  BRONCHIECTASIS (ICD-494.0) - Long hx severe bronchiectasis, granulomatous lung dis, and pneumonia (initially Dx 1968 by DrFrazier, subseq bx= noncaseating gran dz & chr inflam)... her last bronchoscopy was 4/04 revealing some mucus plugging... CTChest 4/04 showed marked bronchiectasis in lingular, LLL, RML, and mucus plugging... Hx of PNEUMONIA (ICD-486) Hx of ADENOCARCINOMA, BREAST (ICD-174.9) - S/P surgery 5/94 [facility] w/ wide excision L breast lesion and LN dissection (stage II 1.0  cm tumor), w/ post op XRT and chemotherapy... DIVERTICULOSIS OF COLON (ICD-562.10) - last colonoscopy 3/05 by DrPatterson showed divertics only... she has +fam hx colon ca w/ colon check q4yrs. DEGENERATIVE JOINT DISEASE (ICD-715.90) ROTATOR CUFF SYNDROME, RIGHT (ICD-726.10) - had injection by [facility] 10/07 w/ improvement. OSTEOPENIA (ICD-733.90) ANEMIA (ICD-285.9) ALLERGIC RHINITIS (ICD-477.9)     Current Allergies: No known allergies   Past Medical History:        BRONCHIECTASIS (ICD-494.0)    Hx of PNEUMONIA (ICD-486)    Hx of ADENOCARCINOMA, BREAST (ICD-174.9)    DIVERTICULOSIS OF COLON (ICD-562.10)    DEGENERATIVE JOINT DISEASE (ICD-715.90)    ROTATOR CUFF SYNDROME, RIGHT (ICD-726.10)    OSTEOPENIA (ICD-733.90)    ANEMIA (ICD-285.9)    ALLERGIC RHINITIS (ICD-477.9)         Review of  Systems       The patient complains of dyspnea on exhertion.  The patient denies anorexia, fever, weight loss, vision loss, decreased hearing, hoarseness, chest pain, syncope, peripheral edema, prolonged cough, hemoptysis, abdominal pain, melena, hematochezia, severe indigestion/heartburn, hematuria, incontinence, muscle weakness, suspicious skin lesions, transient blindness, difficulty walking, depression, unusual weight change, abnormal bleeding, enlarged lymph nodes, and angioedema.     Vital Signs:  Patient Profile:   77 Years Old Female Weight:      148.4 pounds O2 Sat:      98 % O2 treatment:    Room Air Temp:     97.7 degrees F oral Pulse rate:   99 / minute BP sitting:   130 / 62  (left arm)  Vitals Entered By: Marijo File CMA (March 07, 2007 2:12 PM)                 Physical Exam  WD, WN, 77 y/o WF in NAD... GENERAL:  Alert & oriented; pleasant & cooperative. HEENT:  [place]/AT, EOM-wnl, PERRLA, EACs-clear, TMs-wnl, NOSE-clear, THROAT-clear & wnl. NECK:  Supple w/ full ROM; no JVD; normal carotid impulses w/o bruits; no thyromegaly or nodules palpated; no lymphadenopathy. CHEST:  scat bilat rhonchi without wheezing, rales or signs of consolidation, sl tender R costal margin. HEART:  Regular Rhythm; without murmurs/ rubs/ or gallops. ABDOMEN:  Soft & nontender; normal bowel sounds; no organomegaly or masses detected. EXT: without deformities or arthritic changes; no varicose veins/ venous insuffic/ or edema. NEURO:  CN's intact; motor testing normal; sensory testing normal; gait normal & balance OK. DERM:  No  lesions noted; no rash etc...       Impression & Recommendations:  Problem # 1:  BRONCHIECTASIS (ICD-494.0) Stable w/ severe disease... she has CIPRO for Prn use- preferring this to cyclic antibiotics... continue same Rx and her regimen of chest PT, postural drain, etc... plan f/u in 4 months w/ CXR and labs.  Problem # 2:  DEGENERATIVE JOINT DISEASE  (ICD-715.90) Continue OTC meds and f/u w/ DrDaldorff et al Prn... Her updated medication list for this problem includes:    Adult Aspirin Ec Low Strength 81 Mg Tbec (Aspirin) .Marland Kitchen... Take 1 tablet by mouth once a day   Problem # 3:  OSTEOPENIA (ICD-733.90) Rx by her GYN w/ Vit D supplement, Ca++, etc...  Her updated medication list for this problem includes:    Caltrate 600 1500 Mg Tabs (Calcium carbonate) .Marland Kitchen... Take 1 tablet by mouth once a day   Problem # 4:  Hx of ADENOCARCINOMA, BREAST (ICD-174.9) Yearly f/u w/ DrLivesay...  Complete Medication List: 1)  Adult Aspirin Ec Low Strength 81 Mg Tbec (Aspirin) .... Take 1 tablet by mouth once a day 2)  Advair Diskus 250-50 Mcg/dose Misc (Fluticasone-salmeterol) .... Use one puff two times a day rinse mouth after each use 3)  Mucinex Maximum Strength 1200 Mg Tb12 (Guaifenesin) .... Take one tablet by mouth two times a day 4)  Coq10 30 Mg Caps (Coenzyme q10) .... Take one capsule by mouth every day 5)  Multivitamins Tabs (Multiple vitamin) .... Take 1 tablet by mouth once a day 6)  Borage Oil 1000 Mg Caps (Borage (borago officinalis)) .... Take 1 tablet by mouth once a day 7)  Glucosamine-chondroitin 500-400 Mg Caps (Glucosamine-chondroitin) .... Take 1 tablet by mouth once a day 8)  Caltrate 600 1500 Mg Tabs (Calcium carbonate) .... Take 1 tablet by mouth once a day   Patient Instructions: 1)  Please schedule a follow-up appointment in 4 months. 2)  Keep up the good work with your postural drainage etc..Marland Kitchen 3)  Call for any change in the sputum, blood, fever, etc... 4)  When you return in about 4 months we will plan to do your follow up CXR and LABS.Marland KitchenMarland Kitchen    ]

## 2010-03-25 NOTE — Assessment & Plan Note (Signed)
Summary: flu vaccine///kp   Nurse Visit    Prior Medications: ASPIRIN ADULT LOW STRENGTH 81 MG TBEC (ASPIRIN) Take 1 tablet by mouth once a day ADVAIR DISKUS 250-50 MCG/DOSE  MISC (FLUTICASONE-SALMETEROL) use one puff once daily    rinse mouth after each use MUCINEX MAXIMUM STRENGTH 1200 MG  TB12 (GUAIFENESIN) take one tablet by mouth once daily VAGIFEM 25 MCG  TABS (ESTRADIOL) once a week CALTRATE 600 1500 MG  TABS (CALCIUM CARBONATE) Take 1 tablet by mouth once a day GLUCOSAMINE-CHONDROITIN 500-400 MG  CAPS (GLUCOSAMINE-CHONDROITIN) Take 1 tablet by mouth once a day MULTIVITAMINS   TABS (MULTIPLE VITAMIN) Take 1 tablet by mouth once a day BLACK CURRENT 1000MG  () Take 1 tablet by mouth once a day Current Allergies: No known allergies     Orders Added: 1)  Flu Vaccine 78yrs + [11914] 2)  Administration Flu vaccine [G0008]  Flu Vaccine Consent Questions     Do you have a history of severe allergic reactions to this vaccine? no    Any prior history of allergic reactions to egg and/or gelatin? no    Do you have a sensitivity to the preservative Thimersol? no    Do you have a past history of Guillan-Barre Syndrome? no    Do you currently have an acute febrile illness? no    Have you ever had a severe reaction to latex? no    Vaccine information given and explained to patient? yes    Are you currently pregnant? no    Lot Number:[id number]   Site Given  Left Deltoid Kittie Plater  November 30, 2007 2:19 PM

## 2010-03-25 NOTE — Assessment & Plan Note (Signed)
Summary: rov 4 months////kp   Chief Complaint:  4 month follow up.  History of Present Illness: 77 y/o WF here for a follow up visit... she has mult med problems as noted below...   ~  3/09 Rx'd w/ Avelox due to incr cough w/ hemoptysis, and the blood resolved... we discussed getting a culture to see if there is a pathogen causing the recurrent exacerbations... she has sterile cup at home but she has been stable over the last 4 months w/o change in cough, phlegm, CP, SOB, etc... she walks 4 mi daily and does her postural drainage etc- producing a sm amt of green sputum daily...   Current Problem List:  ALLERGIC RHINITIS (ICD-477.9)  BRONCHIECTASIS (ICD-494.0) & Hx of PNEUMONIA (ICD-486) - on ADVAIR 250 (only once daily now), & MUCINEX 1-2Bid...  Long hx severe bronchiectasis, granulomatous lung dis, and pneumonia (initially Dx 1968 by DrFrazier, subseq bx= noncaseating gran dz & chr inflam)... her last bronchoscopy was 4/04 revealing some mucus plugging... CTChest 4/04 showed marked bronchiectasis in lingular, LLL, RML, and mucus plugging... CT repeated 6/06- similar.  ~  Sputum cultures 5/08 grew Serratia (R to amox/ceph, S to all others), branching part acid fast bacilli resembling nocardia, yeast c/w candida...  ~  CXR 1/09 chr lung dis, no change from prev w/ left mid lung zone as the worst area...  Hx of ADENOCARCINOMA, BREAST (ICD-174.9) - S/P surgery 5/94 [facility] w/ wide excision L breast lesion and LN dissection (stage II 1.0  cm tumor), w/ post op XRT and chemotherapy... she was followed by [facility] for oncology... then DrLivesay (released in 2003).  DIVERTICULOSIS OF COLON (ICD-562.10) - last colonoscopy 3/05 by DrPatterson showed divertics only... she has +fam hx colon ca w/ colon check q52yrs.  DEGENERATIVE JOINT DISEASE (ICD-715.90)  ROTATOR CUFF SYNDROME, RIGHT (ICD-726.10) - had injection by [facility] 10/07 w/ improvement.  OSTEOPENIA (ICD-733.90) - last BMD at [facility]  10/06 showed norm spine, norm hip, osteopenic forearm (-2.0)... followed by GYN = DrRomaine who is treating a low Vit D level... she takes calcium, vitamins, & Vit D from [facility] every other week...  ANEMIA (ICD-285.9) - this resolved... her last Hg = 13.4 in May08...  ECZEMA - she has hx of eczema on her hands and has used CoQ10 and Borage Oil as rec by an Dentist in [place]... recently changed to [facility] Current instead of these other supplements...      Current Allergies (reviewed today): No known allergies   Past Medical History:        BRONCHIECTASIS (ICD-494.0)    Hx of PNEUMONIA (ICD-486)    Hx of ADENOCARCINOMA, BREAST (ICD-174.9)    DIVERTICULOSIS OF COLON (ICD-562.10)    DEGENERATIVE JOINT DISEASE (ICD-715.90)    ROTATOR CUFF SYNDROME, RIGHT (ICD-726.10)    OSTEOPENIA (ICD-733.90)    ANEMIA (ICD-285.9)    ALLERGIC RHINITIS (ICD-477.9)       Family History:    Mother deceased age 4 from heart attack    Father deceased age 43 from colon cancer    1 sibling alive age 40  hx of HBP, high triglycerids  Social History:    quit smoking in 1969     smoked for 40 years    exercises 6 times per week    no caffeine use    etoh--1 drink per week    married    5 children   Risk Factors:  Tobacco use:  quit   Review of Systems  The patient complains of prolonged cough.  The patient denies anorexia, fever, weight loss, weight gain, vision loss, decreased hearing, hoarseness, chest pain, syncope, dyspnea on exertion, peripheral edema, headaches, hemoptysis, abdominal pain, melena, hematochezia, severe indigestion/heartburn, hematuria, incontinence, muscle weakness, suspicious skin lesions, transient blindness, difficulty walking, depression, unusual weight change, abnormal bleeding, enlarged lymph nodes, and angioedema.     Vital Signs:  Patient Profile:   77 Years Old Female Weight:      143.38 pounds O2 Sat:      96 % O2 treatment:    Room Air Temp:     97.0  degrees F oral Pulse rate:   88 / minute BP sitting:   130 / 64  (left arm)  Vitals Entered By: Marijo File CMA (November 07, 2007 2:40 PM)                 Physical Exam  WD, WN, 77 y/o WF in NAD... GENERAL:  Alert & oriented; pleasant & cooperative... HEENT:  [place]/AT, EOM-wnl, PERRLA, EACs-clear, TMs-wnl, NOSE-clear, THROAT-clear & wnl. NECK:  Supple w/ full ROM; no JVD; normal carotid impulses w/o bruits; no thyromegaly or nodules palpated; no lymphadenopathy. CHEST:  scat bilat rhonchi without wheezing, rales or signs of consolidation... HEART:  Regular Rhythm; without murmurs/ rubs/ or gallops heard... ABDOMEN:  Soft & nontender; normal bowel sounds; no organomegaly or masses detected. EXT: without deformities or arthritic changes; no varicose veins/ venous insuffic/ or edema. NEURO:  CN's intact; motor testing normal; sensory testing normal; gait normal & balance OK. DERM:  No lesions noted; no rash etc...      Impression & Recommendations:  Problem # 1:  BRONCHIECTASIS (ICD-494.0) Stable w/ severe disease... she does as well as any pt I have had w/ the degree of bronchiectasis... encouraged to take the Advair and Mucinex at the max/ regularly... REC ~  we discussed f/u PNEUMOVAX now and Flu shots this yr...  Problem # 2:  Hx of ADENOCARCINOMA, BREAST (ICD-174.9) Released by Duwayne Heck in 2003... followed w/ self exams and Gyn checks yearly w/ mammograms...  Problem # 3:  DIVERTICULOSIS OF COLON (ICD-562.10) F/u colon due in 2010...  Problem # 4:  DEGENERATIVE JOINT DISEASE (ICD-715.90) Stable and exercising regularly... Her updated medication list for this problem includes:    Aspirin Adult Low Strength 81 Mg Tbec (Aspirin) .Marland Kitchen... Take 1 tablet by mouth once a day   Problem # 5:  OSTEOPENIA (ICD-733.90) Followed by GYN... Her updated medication list for this problem includes:    Caltrate 600 1500 Mg Tabs (Calcium carbonate) .Marland Kitchen... Take 1 tablet by mouth once a  day   Problem # 6:  ANEMIA (ICD-285.9) Last Hg back to norm at 13.6 in May09...  Complete Medication List: 1)  Aspirin Adult Low Strength 81 Mg Tbec (Aspirin) .... Take 1 tablet by mouth once a day 2)  Advair Diskus 250-50 Mcg/dose Misc (Fluticasone-salmeterol) .... Use one puff once daily    rinse mouth after each use 3)  Mucinex Maximum Strength 1200 Mg Tb12 (Guaifenesin) .... Take one tablet by mouth once daily 4)  Vagifem 25 Mcg Tabs (Estradiol) .... Once a week 5)  Caltrate 600 1500 Mg Tabs (Calcium carbonate) .... Take 1 tablet by mouth once a day 6)  Glucosamine-chondroitin 500-400 Mg Caps (Glucosamine-chondroitin) .... Take 1 tablet by mouth once a day 7)  Multivitamins Tabs (Multiple vitamin) .... Take 1 tablet by mouth once a day 8)  Black Current 1000mg   .... Take 1 tablet by  mouth once a day  Other Orders: Pneumococcal Vaccine (04540) Admin 1st Vaccine (98119)   Patient Instructions: 1)  Today we updated your med list- see below.... 2)  We also gave you a PNEUMONIA shot - should be good for 5-80yrs.Marland KitchenMarland Kitchen 3)  Don't forget to get your two Flu shots this year... 4)  Continue your same meds, and your excellent exercise program! 5)  Call for any problems.Marland KitchenMarland Kitchen 6)  Please schedule a follow-up appointment in 4-6 months.   ]  Pneumovax Vaccine    Vaccine Type: Pneumovax    Site: right deltoid    Mfr: Sanofi Pasteur    Dose: 0.5 ml    Route: IM    Given by: Boone Master CNA    Exp. Date: 08/25/2008    Lot #: [id number]    VIS given: 09/21/95 version given November 07, 2007.

## 2010-03-25 NOTE — Letter (Signed)
Summary: [facility] Instructions  [facility]  [address] [place], [place] [address]   Phone: [phone]  Fax: [phone]       Miranda Thompson    [birth year]    MRN: [id number]        Procedure Day Dorna Bloom:  Wednesday   May 11,2011      Arrival Time: 9:30 a.m.     Procedure Time: 10:30 a.m.     Location of Procedure:                    _x_  [facility] (4th Floor)                        PREPARATION FOR COLONOSCOPY WITH MOVIPREP   Starting 5 days prior to your procedure  06/28/09 do not eat nuts, seeds, popcorn, corn, beans, peas,  salads, or any raw vegetables.  Do not take any fiber supplements (e.g. Metamucil, Citrucel, and Benefiber).  THE DAY BEFORE YOUR PROCEDURE         DATE:  07/02/09   DAY:  Tuesday  1.  Drink clear liquids the entire day-NO SOLID FOOD  2.  Do not drink anything colored red or purple.  Avoid juices with pulp.  No orange juice.  3.  Drink at least 64 oz. (8 glasses) of fluid/clear liquids during the day to prevent dehydration and help the prep work efficiently.  CLEAR LIQUIDS INCLUDE: Water Jello Ice Popsicles Tea (sugar ok, no milk/cream) Powdered fruit flavored drinks Coffee (sugar ok, no milk/cream) Gatorade Juice: apple, white grape, white cranberry  Lemonade Clear bullion, consomm, broth Carbonated beverages (any kind) Strained chicken noodle soup Hard Candy                             4.  In the morning, mix first dose of MoviPrep solution:    Empty 1 Pouch A and 1 Pouch B into the disposable container    Add lukewarm drinking water to the top line of the container. Mix to dissolve    Refrigerate (mixed solution should be used within 24 hrs)  5.  Begin drinking the prep at 5:00 p.m. The MoviPrep container is divided by 4 marks.   Every 15 minutes drink the solution down to the next mark (approximately 8 oz) until the full liter is complete.   6.  Follow completed prep with 16 oz of clear liquid of  your choice (Nothing red or purple).  Continue to drink clear liquids until bedtime.  7.  Before going to bed, mix second dose of MoviPrep solution:    Empty 1 Pouch A and 1 Pouch B into the disposable container    Add lukewarm drinking water to the top line of the container. Mix to dissolve    Refrigerate  THE DAY OF YOUR PROCEDURE      DATE:  07/03/09  DAY:  Wednesday  Beginning at  5:30 a.m. (5 hours before procedure):         1. Every 15 minutes, drink the solution down to the next mark (approx 8 oz) until the full liter is complete.  2. Follow completed prep with 16 oz. of clear liquid of your choice.    3. You may drink clear liquids until  8:30 a.m.  (2 HOURS BEFORE PROCEDURE).   MEDICATION INSTRUCTIONS  Unless otherwise instructed, you should take regular prescription medications with  a small sip of water   as early as possible the morning of your procedure.        OTHER INSTRUCTIONS  You will need a responsible adult at least 75 years of age to accompany you and drive you home.   This person must remain in the waiting room during your procedure.  Wear loose fitting clothing that is easily removed.  Leave jewelry and other valuables at home.  However, you may wish to bring a book to read or  an iPod/MP3 player to listen to music as you wait for your procedure to start.  Remove all body piercing jewelry and leave at home.  Total time from sign-in until discharge is approximately 2-3 hours.  You should go home directly after your procedure and rest.  You can resume normal activities the  day after your procedure.  The day of your procedure you should not:   Drive   Make legal decisions   Operate machinery   Drink alcohol   Return to work  You will receive specific instructions about eating, activities and medications before you leave.    The above instructions have been reviewed and explained to me by   Wyona Almas RN  June 19, 2009 2:06  PM     I fully understand and can verbalize these instructions _____________________________ Date _________

## 2010-03-25 NOTE — Miscellaneous (Signed)
Summary: [facility] Previsit/prep  Clinical Lists Changes  Medications: Added new medication of MOVIPREP 100 GM  SOLR (PEG-KCL-NACL-NASULF-NA ASC-C) As per prep instructions. - Signed Rx of MOVIPREP 100 GM  SOLR (PEG-KCL-NACL-NASULF-NA ASC-C) As per prep instructions.;  #1 x 0;  Signed;  Entered by: Wyona Almas RN;  Authorized by: Mardella Layman MD [facility];  Method used: Electron[facility] to [facility] [phone]*, [address], [place], [place]  [address], Ph: [phone] or [phone], Fax: [phone] Observations: Added new observation of NKA: T (06/19/2009 13:34)    Prescriptions: MOVIPREP 100 GM  SOLR (PEG-KCL-NACL-NASULF-NA ASC-C) As per prep instructions.  #1 x 0   Entered by:   Wyona Almas RN   Authorized by:   Mardella Layman MD [facility]   Signed by:   Wyona Almas RN on 06/19/2009   Method used:   [facility] to        [facility] [phone][facility] (retail)       [address]       [place], [place]  [address]       Ph: [phone] or [phone]       Fax: [phone]   RxID:   [id number]

## 2010-03-25 NOTE — Miscellaneous (Signed)
Summary: flu vaccine   Clinical Lists Changes  Observations: Added new observation of FLU VAX: Historical (12/03/2009 16:40)      Immunization History:  Influenza Immunization History:    Influenza:  historical (12/03/2009)

## 2010-03-25 NOTE — Letter (Signed)
Summary: [facility]   Imported By: Sherian Rein 04/09/2008 14:52:16  _____________________________________________________________________  External Attachment:    Type:   Image     Comment:   External Document

## 2010-03-25 NOTE — Progress Notes (Signed)
Summary: Triage   Phone Note Call from Patient Call back at Home Phone [phone]   Caller: Patient Call For: Dr. Jarold Motto Reason for Call: Talk to Nurse Summary of Call: Pt had colonoscopy in 2005....wants to know when she will be due. Family hx of colon cancer. Initial call taken by: Karna Christmas,  June 04, 2009 4:09 PM  Follow-up for Phone Call        Colon due every 5 years.  LM for pt to call. Lupita Leash Surface RN  June 04, 2009 4:35 PM  Pt husband notified.  Pt should sch colon soon.   Follow-up by: Ashok Cordia RN,  June 05, 2009 10:55 AM

## 2010-03-25 NOTE — Assessment & Plan Note (Signed)
Summary: ROV 6 MONTHS///KP   CC:  6 month ROV & review of mult medical problems....  History of Present Illness: 77 y/o WF here for a follow up visit... she has mult med problems as noted below...    ~  May 07, 2008:  she has chr bronchiectasis & stable over the last few yrs w/o change in cough, phlegm, CP, SOB, etc... she walks 4 mi daily and does her postural drainage etc- producing a small amt of green sputum daily... she stopped her jogging when she developed LBP and eval by [facility] w/ MRI showed herniated disc L3-4 w/ mod spinal stenosis and lat recess stenosis bilat... incidental left adnexal cyst measures <2cm & she will check w/ GYN about this... she was treated w/ dosepak and Advil and improved!   ~  November 06, 2008:  she had one episode hemoptysis 8/10- C/S= NTF, & AFB= neg;  Rx'd Avelox & improved... notes mild LLQ discomfort intermittently over the last week, sl constip, no blood, no f/c/s... I have rec CT Abd but she wants to wait & will call if symptoms persist>> (symptoms resolved).   ~  May 06, 2009:  she notes self limited episodes of hemoptysis  ~1/mo... she continues to do her postural drainage, Advair, Mucinex, Fluids, etc & is very productive w/ copious thick green sputum regularly... she continues to exercise regularly & takes numerous herbs etc- the latest is Tumeric "it's good for arthritis" per the [facility]...  last sputum cultures= 8/10- NTF, & neg AFB...  she is under some stress w/ neice recently Dx w/ cancer...      **CXR today>> diffuse changes, scarring, bronchiectasis, stable-NAD...    Current Problem List:  ALLERGIC RHINITIS (ICD-477.9) - she uses OTC antihistamines Prn.  BRONCHIECTASIS (ICD-494.0) & Hx of PNEUMONIA (ICD-486) - on ADVAIR 250 Bid, & MUCINEX 1-2Bid...  Long hx severe bronchiectasis, granulomatous lung dis, and pneumonia (initially Dx 1968 by DrFrazier, subseq bx= noncaseating gran dz & chr inflam)... her last bronchoscopy was 4/04  revealing some mucus plugging... CTChest 4/04 showed marked bronchiectasis in lingular, LLL, RML, and mucus plugging... CT repeated 6/06- similar.  ~  Sputum cultures 5/08 grew Serratia (R to amox/ceph, S to all others), branching part acid fast bacilli resembling nocardia, yeast c/w candida...  ~  CXR 1/09 chr lung dis, no change from prev w/ left mid lung zone as the worst area...  ~  CXR 3/10 w/ chr lung changes and NAD suspected...  ~  Sputum 8/10 showed NTF only & AFB smears & cult all neg...  ~  CXR 3/11 showed chr lung dis, no acute changes, stable...  Hx of ADENOCARCINOMA, BREAST (ICD-174.9) - S/P surgery 5/94 [facility] w/ wide excision L breast lesion and LN dissection (stage II 1.0  cm tumor), w/ post op XRT and chemotherapy... she was followed by [facility] for oncology... then DrLivesay (released in 2003).  ~  routine Mammogram 2/10 was neg- f/u planned 52yr.  ~  f/u mammogram 2/11 = neg, NAD, f/u planned 17yr.  DIVERTICULOSIS OF COLON (ICD-562.10) - last colonoscopy 3/05 by DrPatterson showed divertics only... she has +fam hx colon ca w/ colon check q66yrs.  DEGENERATIVE JOINT DISEASE (ICD-715.90) - see Ortho eval 2010 by DrDaldorf... LOW BACK PAIN SYNDROME (ICD-724.2) - LBP and eval by [facility] w/ MRI showed herniated disc L3-4 w/ mod spinal stenosis and lat recess stenosis bilat...   ROTATOR CUFF SYNDROME, RIGHT (ICD-726.10) - had injection by [facility] 10/07 w/ improvement.  OSTEOPENIA (  ICD-733.90) - last BMD at [facility] 10/06 showed norm spine, norm hip, osteopenic forearm (-2.0)... followed by GYN = DrRomaine who is treating a low Vit D level... she takes calcium, vitamins, & Vit D (50000 u weekly) from [place]...  ~  labs 9/10 showed Vit D level = 42... rec> change to 1000 u daily.  Hx of ANEMIA (ICD-285.9) -  resolved...  ~  labs 9/10 showed Hg= 13.6  ECZEMA - she has hx of eczema on her hands and has used CoQ10 and Borage Oil as rec by an Dentist in [place]...  recently changed to [facility] Current instead of these other supplements & rash is much improved...   Allergies (verified): No Known Drug Allergies  Comments:  Nurse/Medical Assistant: The patient's medications and allergies were reviewed with the patient and were updated in the Medication and Allergy Lists.  Past History:  Past Medical History:  ALLERGIC RHINITIS (ICD-477.9) HEMOPTYSIS (ICD-786.3) BRONCHIECTASIS (ICD-494.0) Hx of PNEUMONIA (ICD-486) Hx of ADENOCARCINOMA, BREAST (ICD-174.9) DIVERTICULOSIS OF COLON (ICD-562.10) DEGENERATIVE JOINT DISEASE (ICD-715.90) LOW BACK PAIN SYNDROME (ICD-724.2) ROTATOR CUFF SYNDROME, RIGHT (ICD-726.10) OSTEOPENIA (ICD-733.90) ANEMIA (ICD-285.9)  Family History: Reviewed history from 05/07/2008 and no changes required. Mother deceased age 3 from heart attack Father deceased age 44 from colon cancer 1 Sibling alive age 28  hx of HBP, high triglycerids  Social History: Reviewed history from 11/07/2007 and no changes required. quit smoking in 1969  smoked for 40 years exercises 6 times per week no caffeine use etoh--1 drink per week married 5 children  Review of Systems      See HPI       The patient complains of prolonged cough and hemoptysis.  The patient denies anorexia, fever, weight loss, weight gain, vision loss, decreased hearing, hoarseness, chest pain, syncope, dyspnea on exertion, peripheral edema, headaches, abdominal pain, melena, hematochezia, severe indigestion/heartburn, hematuria, incontinence, muscle weakness, suspicious skin lesions, transient blindness, difficulty walking, depression, unusual weight change, abnormal bleeding, enlarged lymph nodes, and angioedema.    Vital Signs:  Patient profile:   77 year old female Height:      64 inches Weight:      145.25 pounds BMI:     25.02 O2 Sat:      99 % on Room air Temp:     96.9 degrees F oral Pulse rate:   89 / minute BP sitting:   126 / 74  (left arm) Cuff  size:   regular  Vitals Entered By: Randell Loop CMA (May 06, 2009 2:03 PM)  O2 Sat at Rest %:  99 O2 Flow:  Room air CC: 6 month ROV & review of mult medical problems... Is Patient Diabetic? No Pain Assessment Patient in pain? no      Comments meds updated today   Physical Exam  Additional Exam:  WD, WN, 77 y/o WF in NAD... GENERAL:  Alert & oriented; pleasant & cooperative... HEENT:  [place]/AT, EOM-wnl, PERRLA, EACs-clear, TMs-wnl, NOSE-clear, THROAT-clear & wnl. NECK:  Supple w/ fairROM; no JVD; normal carotid impulses w/o bruits; no thyromegaly or nodules palpated; no lymphadenopathy. CHEST:  scat bilat rhonchi without wheezing, rales or signs of consolidation... HEART:  Regular Rhythm; without murmurs/ rubs/ or gallops heard... ABDOMEN:  Soft & nontender; normal bowel sounds; no organomegaly or masses detected. EXT: without deformities or arthritic changes; no varicose veins/ venous insuffic/ or edema. NEURO:  CN's intact;  no focal neuro deficits... DERM:  No lesions noted; no rash etc...    CXR  Procedure date:  05/06/2009  Findings:      CHEST - 2 VIEW Comparison: 05/07/2008   Findings: Heart size is normal.  Pulmonary arteries appear chronically prominent.  There is chronic abnormal density in the left upper lobe with scarring and parenchymal calcification.  This all appears stable.  Pulmonary scarring elsewhere throughout the chest appears stable as well.  No sign of active infiltrate, effusion or collapse.  Ordinary degenerative changes effect the spine.   IMPRESSION: Stable appearance of chronic lung disease with widespread scarring. Focal involvement inferiorly in the left upper lobe with areas of parenchymal calcification appears stable as well.  No active process evident.   Read By:  Thomasenia Sales,  M.D.   Impression & Recommendations:  Problem # 1:  BRONCHIECTASIS (ICD-494.0) She has chr bronchiectasis & stable w/ severe dis... she describes  copious sputum prod w/ her chest PT/ postural drain...  REC>  continue all chr therapies regularly... call for hemoptysis to re-eval, consider Ab Rx/ steroids/ etc...  Problem # 2:  Hx of ADENOCARCINOMA, BREAST (ICD-174.9) Aware-  no known recurrence, recent f/u mammogram neg.  Problem # 3:  DIVERTICULOSIS OF COLON (ICD-562.10) Stable-  due for f/u colon DrPatterson & she will call at her convenience...  Problem # 4:  LOW BACK PAIN SYNDROME (ICD-724.2) Followed by [facility] & stable w/ her home PT/ exercise program... Her updated medication list for this problem includes:    Aspirin Adult Low Strength 81 Mg Tbec (Aspirin) .Marland Kitchen... Take 1 tablet by mouth once a day    Aleve 220 Mg Tabs (Naproxen sodium) .Marland Kitchen... As needed  Problem # 5:  OSTEOPENIA (ICD-733.90) Followed by GYN= DrRomaine... continue calcium, Vits, Vit D...  Problem # 6:  OTHER MEDICAL PROBLEMS AS NOTED>>>  Complete Medication List: 1)  Aspirin Adult Low Strength 81 Mg Tbec (Aspirin) .... Take 1 tablet by mouth once a day 2)  Advair Diskus 250-50 Mcg/dose Misc (Fluticasone-salmeterol) .... Use one puff twice daily-  rinse mouth after each use 3)  Mucinex Maximum Strength 1200 Mg Tb12 (Guaifenesin) .... Take one tablet by mouth once daily 4)  Vagifem 25 Mcg Tabs (Estradiol) .... Once a week 5)  Aleve 220 Mg Tabs (Naproxen sodium) .... As needed 6)  Caltrate 600 1500 Mg Tabs (Calcium carbonate) .... Take 1 tablet by mouth once a day 7)  Glucosamine-chondroitin 500-400 Mg Caps (Glucosamine-chondroitin) .... Take 1 tablet by mouth once a day 8)  Multivitamins Tabs (Multiple vitamin) .... Take 1 tablet by mouth once a day 9)  Vitamin D 16109 Unit Caps (Ergocalciferol) .... Once weekly 10)  Black Current 1000mg   .... Take 1 tablet by mouth once a day 11)  Silymarin Caps (Milk thistle-turmeric) .... Take 2 tablets by mouth once daily  Other Orders: Prescription Created Electronically [id number]) T-2 View CXR ([id number])  Patient  Instructions: 1)  Today we updated your med list- see below.... 2)  Continue your current medications the same... 3)  Keep up the great work w/ exercise & your postural drainage regimen... 4)  Call for any problems.Marland KitchenMarland Kitchen 5)  Please schedule a follow-up appointment in 6 months, sooner if needed... Prescriptions: ADVAIR DISKUS 250-50 MCG/DOSE  MISC (FLUTICASONE-SALMETEROL) use one puff twice daily-  rinse mouth after each use  #1 x prn   Entered and Authorized by:   Michele Mcalpine MD   Signed by:   Michele Mcalpine MD on 05/06/2009   Method used:   Print then Give to Patient   RxID:   [id number]

## 2010-03-25 NOTE — Assessment & Plan Note (Signed)
Summary: 6 months/apc   CC:  6 month ROV & review of mult medical problems....  History of Present Illness: 77 y/o WF here for a follow up visit... she has mult med problems as noted below...    ~  Mar10:  she has chr bronchiectasis & stable over the last few yrs w/o change in cough, phlegm, CP, SOB, etc... she walks 4 mi daily and does her postural drainage etc- producing a small amt of green sputum daily... she stopped her jogging when she developed LBP and eval by [facility] w/ MRI showed herniated disc L3-4 w/ mod spinal stenosis and lat recess stenosis bilat... incidental left adnexal cyst measures <2cm & she will check w/ GYN about this... she was treated w/ dosepak and Advil and improved!  ~  Sept10:  she had one episode hemoptysis 8/10- C/S= NTF, & AFB= neg;  Rx'd Avelox & improved... notes mild LLQ discomfort intermittently over the last week, sl constip, no blood, no f/c/s... I have rec CT Abd but she wants to wait & will call if symptoms persist>> (symptoms resolved).   ~  May 06, 2009:  she notes self limited episodes of hemoptysis  ~1/mo... she continues to do her postural drainage, Advair, Mucinex, Fluids, etc & is very productive w/ copious thick green sputum regularly... she continues to exercise regularly & takes numerous herbs etc- the latest is Tumeric "it's good for arthritis" per the [facility]...  last sputum cultures= 8/10- NTF, & neg AFB...  she is under some stress w/ neice recently Dx w/ cancer... **CXR today>> diffuse changes, scarring, bronchiectasis, stable-NAD...   ~  November 04, 2009:  36mo ROV- recent upper resp infection w/ incr cough, green gray phlegm, no blood, temp to 100, no CP/ ch in SOB- she toughed it out & is now improved w/o intercurrent antibiotic Rx... she does note incr fatigue & recent labs showed UTI> we will Rx w/ Cipro for this & bronchiectasis...  she notes that her ortho symptoms are improved w/ the Tumeric & Glucosamine, & prev dermatitis  resolved w/ black current... advised to remain on the Calcium, MVI, Vit D daily... she had colonoscopy 5/11 +divertics only.   Current Problem List:  ALLERGIC RHINITIS (ICD-477.9) - she uses OTC antihistamines Prn.  BRONCHIECTASIS (ICD-494.0) & Hx of PNEUMONIA (ICD-486) - on ADVAIR 250 Bid, & MUCINEX 1-2Bid...  Long hx severe bronchiectasis, granulomatous lung dis, and pneumonia (initially Dx 1968 by DrFrazier, subseq bx= noncaseating gran dz & chr inflam)... her last bronchoscopy was 4/04 revealing some mucus plugging... CTChest 4/04 showed marked bronchiectasis in lingular, LLL, RML, and mucus plugging... CT repeated 6/06- similar.  ~  Sputum cultures 5/08 grew Serratia (R to amox/ceph, S to all others), branching part acid fast bacilli resembling nocardia, yeast c/w candida...  ~  CXR 1/09 chr lung dis, no change from prev w/ left mid lung zone as the worst area...  ~  CXR 3/10 w/ chr lung changes and NAD suspected...  ~  Sputum 8/10 showed NTF only & AFB smears & cult all neg...  ~  CXR 3/11 showed chr lung dis, no acute changes, stable...  HYPERCHOLESTEROLEMIA, BORDERLINE (ICD-272.4) - on diet alone...  ~  FLP 9/10 showed TChol 174, TG 64, HDL 49, LDL 112  ~  FLP 9/11 showed TChol 175, TG 61, HDL 48, LDL 115  Hx of ADENOCARCINOMA, BREAST (ICD-174.9) - S/P surgery 5/94 [facility] w/ wide excision L breast lesion and LN dissection (stage II 1.0  cm  tumor), w/ post op XRT and chemotherapy... she was followed by [facility] for oncology... then DrLivesay (released in 2003).  ~  routine Mammogram 2/10 was neg- f/u planned 35yr.  ~  f/u mammogram 2/11 = neg, NAD, f/u planned 76yr.  DIVERTICULOSIS OF COLON (ICD-562.10) - colonoscopy 3/05 by DrPatterson showed divertics only... she has +fam hx colon ca w/ colon check q52yrs... f/u colonoscopy 5/11 showed +divertics, no polyps...  DEGENERATIVE JOINT DISEASE (ICD-715.90) - see Ortho eval 2010 by DrDaldorf... she takes Glucosamine & Herbal supplement-  milk thistle/ tumeric which she says really helps...  LOW BACK PAIN SYNDROME (ICD-724.2) - LBP and eval by [facility] 2010 w/ MRI showed herniated disc L3-4 w/ mod spinal stenosis and lat recess stenosis bilat...   ROTATOR CUFF SYNDROME, RIGHT (ICD-726.10) - had injection by [facility] 10/07 w/ improvement.  OSTEOPENIA (ICD-733.90) - last BMD at [facility] 10/06 showed norm spine, norm hip, osteopenic forearm (-2.0)... followed by GYN = DrRomaine who is treating a low Vit D level... she takes calcium, Vitamins, & Vit D 1000u daily from [place]...  ~  labs 9/10 on 50K weekly showed Vit D level = 42... rec> change to 1000 u daily.  ~  labs 9/11 on 1000 u daily showed Vit D level = 34... rec> continue daily supplement.  Hx of ANEMIA (ICD-285.9) -  resolved...  ~  labs 9/10 showed Hg= 13.6  ~  labs 9/11 showed Hg= 13.2  ECZEMA - she has hx of eczema on her hands and has used CoQ10 and Borage Oil as rec by an Dentist in [place]... recently changed to [facility] Current instead of these other supplements & rash is much improved...   Preventive Screening-Counseling & Management  Alcohol-Tobacco     Smoking Status: quit     Year Quit: 1969  Allergies (verified): No Known Drug Allergies  Comments:  Nurse/Medical Assistant: The patient's medications and allergies were reviewed with the patient and were updated in the Medication and Allergy Lists.  Past History:  Past Medical History: ALLERGIC RHINITIS (ICD-477.9) BRONCHIECTASIS WITH ACUTE EXACERBATION (ICD-494.1) HEMOPTYSIS (ICD-786.3) BRONCHIECTASIS (ICD-494.0) Hx of PNEUMONIA (ICD-486) HYPERCHOLESTEROLEMIA, BORDERLINE (ICD-272.4) Hx of ADENOCARCINOMA, BREAST (ICD-174.9) DIVERTICULOSIS OF COLON (ICD-562.10) DEGENERATIVE JOINT DISEASE (ICD-715.90) LOW BACK PAIN SYNDROME (ICD-724.2) ROTATOR CUFF SYNDROME, RIGHT (ICD-726.10) OSTEOPENIA (ICD-733.90) ANEMIA (ICD-285.9)  Family History: Reviewed history from 05/07/2008 and no changes  required. Mother deceased age 16 from heart attack Father deceased age 3 from colon cancer 1 Sibling alive age 14  hx of HBP, high triglycerids  Social History: Reviewed history from 11/07/2007 and no changes required. quit smoking in 1969  smoked for 40 years exercises 6 times per week no caffeine use etoh--1 drink per week married 5 children  Review of Systems      See HPI       The patient complains of dyspnea on exertion and prolonged cough.  The patient denies anorexia, fever, weight loss, weight gain, vision loss, decreased hearing, hoarseness, chest pain, syncope, peripheral edema, headaches, hemoptysis, abdominal pain, melena, hematochezia, severe indigestion/heartburn, hematuria, incontinence, muscle weakness, suspicious skin lesions, transient blindness, difficulty walking, depression, unusual weight change, abnormal bleeding, enlarged lymph nodes, and angioedema.    Vital Signs:  Patient profile:   77 year old female Height:      64 inches Weight:      139.50 pounds BMI:     24.03 O2 Sat:      94 % on Room air Temp:     98.4 degrees F oral Pulse rate:  100 / minute BP sitting:   140 / 60  (right arm) Cuff size:   regular  Vitals Entered By: Randell Loop CMA (November 04, 2009 2:09 PM)  O2 Sat at Rest %:  94 O2 Flow:  Room air CC: 6 month ROV & review of mult medical problems... Is Patient Diabetic? No Pain Assessment Patient in pain? no      Comments meds updated today   Physical Exam  Additional Exam:  WD, WN, 77 y/o WF in NAD... GENERAL:  Alert & oriented; pleasant & cooperative... HEENT:  [place]/AT, EOM-wnl, PERRLA, EACs-clear, TMs-wnl, NOSE-clear, THROAT-clear & wnl. NECK:  Supple w/ fairROM; no JVD; normal carotid impulses w/o bruits; no thyromegaly or nodules palpated; no lymphadenopathy. CHEST:  scat bilat rhonchi without wheezing, rales or signs of consolidation... HEART:  Regular Rhythm; without murmurs/ rubs/ or gallops heard... ABDOMEN:  Soft  & nontender; normal bowel sounds; no organomegaly or masses detected. EXT: without deformities or arthritic changes; no varicose veins/ venous insuffic/ or edema. NEURO:  CN's intact;  no focal neuro deficits... DERM:  No lesions noted; no rash etc...    MISC. Report  Procedure date:  10/29/2009  Findings:      BMP (METABOL)   Sodium                    138 mEq/L                   135-145   Potassium            [H]  5.2 mEq/L                   3.5-5.1   Chloride                  100 mEq/L                   96-112   Carbon Dioxide            31 mEq/L                    19-32   Glucose                   87 mg/dL                    06-30   BUN                       14 mg/dL                    1-60   Creatinine                0.5 mg/dL                   1.0-9.3   Calcium                   9.1 mg/dL                   2.3-55.7   GFR                       128.03 mL/min               >60  Lipid Panel (LIPID)   Cholesterol  175 mg/dL                   1-610   Triglycerides             61.0 mg/dL                  9.6-045.4   HDL                       09.81 mg/dL                 >19.14   LDL Cholesterol      [H]  782 mg/dL                   9-56   CBC Platelet w/Diff (CBCD)   White Cell Count          5.3 K/uL                    4.5-10.5   Red Cell Count            4.02 Mil/uL                 3.87-5.11   Hemoglobin                13.2 g/dL                   21.3-08.6   Hematocrit                38.5 %                      36.0-46.0   MCV                       95.9 fl                     78.0-100.0   Platelet Count            273.0 K/uL                  150.0-400.0   Neutrophil %              70.7 %                      43.0-77.0   Lymphocyte %              15.9 %                      12.0-46.0   Monocyte %                8.9 %                       3.0-12.0   Eosinophils%              4.1 %                       0.0-5.0   Basophils %               0.4 %    Comments:       Hepatic/Liver Function Panel (HEPATIC)   Total Bilirubin           0.6 mg/dL  0.3-1.2   Direct Bilirubin          0.1 mg/dL                   1.6-1.0   Alkaline Phosphatase      70 U/L                      39-117   AST                       25 U/L                      0-37   ALT                       17 U/L                      0-35   Total Protein             7.4 g/dL                    9.6-0.4   Albumin                   3.7 g/dL                    5.4-0.9  TSH (TSH)   FastTSH                   2.44 uIU/mL                 0.35-5.50  Sed Rate (ESR)   Sed Rate             [H]  67 mm/hr                    0-22  UDip w/Micro (URINE)   Color                     LT. YELLOW   Clarity                   CLEAR                       Clear   Specific Gravity          1.010                       1.000 - 1.030   Urine Ph                  7.0                         5.0-8.0   Protein                   NEGATIVE                    Negative   Urine Glucose             NEGATIVE                    Negative   Ketones                   NEGATIVE  Negative   Urine Bilirubin           NEGATIVE                    Negative   Blood                     SMALL                       Negative   Urobilinogen              0.2                         0.0 - 1.0   Leukocyte Esterace        LARGE                       Negative   Nitrite                   POSITIVE                    Negative   Urine WBC                 21-50/hpf                   0-2/hpf   Urine RBC                 3-6/hpf                     0-2/hpf   Urine Epith               Few(5-10/hpf)               Rare(0-4/hpf)   Urine Bacteria            Many(>50/hpf)               None  Vitamin D (25-Hydroxy) (16109)  Vitamin D (25-Hydroxy)                             34 ng/mL                    30-89   Impression & Recommendations:  Problem # 1:  ACUTE CYSTITIS (ICD-595.0) Labs show UTI w/ min symptoms> we discussed  Rx w/ Cipro for this & bronchiectasis... Her updated medication list for this problem includes:    Ciprofloxacin Hcl 500 Mg Tabs (Ciprofloxacin hcl) .Marland Kitchen... Take 1 tab by mouth two times a day til gone...  Problem # 2:  BRONCHIECTASIS WITH ACUTE EXACERBATION (ICD-494.1) As above>  continue chronic chest Rx & add course of Cipro...  Problem # 3:  Hx of ADENOCARCINOMA, BREAST (ICD-174.9) Aware-  prev followed DrLivesay, stable, mammograms in Feb...  Problem # 4:  DIVERTICULOSIS OF COLON (ICD-562.10) Stable>  colon 5/11 was neg x for divertics...  Problem # 5:  DEGENERATIVE JOINT DISEASE (ICD-715.90) Followed by Ortho>  she does well on supplements & herbs... Her updated medication list for this problem includes:    Aspirin Adult Low Strength 81 Mg Tbec (Aspirin) .Marland Kitchen... Take 1 tablet by mouth once a day    Aleve 220 Mg Tabs (Naproxen sodium) .Marland Kitchen... As needed  Problem # 6:  OSTEOPENIA (ICD-733.90) Followed by DrRomaine... pt remains  on Calcium, Vits, Vit D & herbs...  Problem # 7:  OTHJER MEDICAL PROBLEMS AS NOTED>>>  Complete Medication List: 1)  Aspirin Adult Low Strength 81 Mg Tbec (Aspirin) .... Take 1 tablet by mouth once a day 2)  Advair Diskus 250-50 Mcg/dose Misc (Fluticasone-salmeterol) .... Use one puff twice daily-  rinse mouth after each use 3)  Mucinex Maximum Strength 1200 Mg Tb12 (Guaifenesin) .... Take one tablet by mouth once daily 4)  Vagifem 25 Mcg Tabs (Estradiol) .... Once a week 5)  Aleve 220 Mg Tabs (Naproxen sodium) .... As needed 6)  Glucosamine-chondroitin 500-400 Mg Caps (Glucosamine-chondroitin) .... Take 1 tablet by mouth once a day 7)  Citracal Maximum 315-250 Mg-unit Tabs (Calcium citrate-vitamin d) .... Take 1 tablet by mouth once a day 8)  Multivitamins Tabs (Multiple vitamin) .... Take 1 tablet by mouth once a day 9)  Vitamin D 1000 Unit Tabs (Cholecalciferol) .... Take 1 tab by mouth once daily... 10)  Black Current 1000mg   .... Take 1 tablet by mouth once  a day 11)  Silymarin Caps (Milk thistle-turmeric) .... Take 2 tablets by mouth once daily 12)  Ciprofloxacin Hcl 500 Mg Tabs (Ciprofloxacin hcl) .... Take 1 tab by mouth two times a day til gone...  Patient Instructions: 1)  Today we updated your med list- see below.... 2)  We wrote a new perscription for CIPRO to take twice daily til gone...  3)  Continue your other meds the same... 4)  Stay as active as possible... 5)  Call for any problems.Marland Kitchen 6)  Please schedule a follow-up appointment in 6 months. Prescriptions: CIPROFLOXACIN HCL 500 MG TABS (CIPROFLOXACIN HCL) take 1 tab by mouth two times a day til gone...  #20 x 0   Entered and Authorized by:   Michele Mcalpine MD   Signed by:   Michele Mcalpine MD on 11/04/2009   Method used:   Print then Give to Patient   RxID:   [id number]

## 2010-03-25 NOTE — Procedures (Signed)
Summary: Colonoscopy  Patient: Miranda Thompson Note: All result statuses are Final unless otherwise noted.  Tests: (1) Colonoscopy (COL)   COL Colonoscopy           DONE     [facility]     [address]     [place], [place]  [address]           COLONOSCOPY PROCEDURE REPORT           PATIENT:  Miranda Thompson, Miranda Thompson  MR#:  [id number]     BIRTHDATE:  24-May-1933, 76 yrs. old  GENDER:  female     ENDOSCOPIST:  Vania Rea. Jarold Motto, MD, [facility]     REF. BY:     PROCEDURE DATE:  07/03/2009     PROCEDURE:  Higher-risk screening colonoscopy G0105           ASA CLASS:  Class II     INDICATIONS:  Elevated Risk Screening, family history of colon     cancer FATHER UNDER AGE 51.     MEDICATIONS:   Fentanyl 50 mcg IV, Versed 7 mg IV           DESCRIPTION OF PROCEDURE:   After the risks benefits and     alternatives of the procedure were thoroughly explained, informed     consent was obtained.  No rectal exam performed. The LB [id number]-[id number]     [id number] endoscope was introduced through the anus and advanced to     the cecum, which was identified by both the appendix and ileocecal     valve, without limitations.  The quality of the prep was     excellent, using MoviPrep.  The instrument was then slowly     withdrawn as the colon was fully examined.     <<PROCEDUREIMAGES>>           FINDINGS:  Scattered diverticula were found in the sigmoid to     descending colon segments.  No polyps or cancers were seen.  This     was otherwise a normal examination of the colon.   Retroflexed     views in the rectum revealed no abnormalities.    The scope was     then withdrawn from the patient and the procedure completed.           COMPLICATIONS:  None     ENDOSCOPIC IMPRESSION:     1) Diverticula, scattered in the sigmoid to descending colon     segments     2) No polyps or cancers     3) Otherwise normal examination     RECOMMENDATIONS:     1) high fiber diet     2) Repeat Colonoscopy in 5 years.  REPEAT EXAM:  No           ______________________________     Vania Rea. Jarold Motto, MD, [facility]           CC:  Michele Mcalpine, MD           n.     Rosalie DoctorMarland Kitchen   Vania Rea. Patterson at 07/03/2009 10:44 AM           Reita May, [id number]  Note: An exclamation mark (!) indicates a result that was not dispersed into the flowsheet. Document Creation Date: 07/03/2009 10:44 AM _______________________________________________________________________  (1) Order result status: Final Collection or observation date-time: 07/03/2009 10:39 Requested date-time:  Receipt date-time:  Reported date-time:  Referring Physician:   Ordering Physician: Sheryn Bison [phone]) Specimen  Source:  Source: Launa Grill Order Number: [id number] Lab site:   Appended Document: Colonoscopy    Clinical Lists Changes  Observations: Added new observation of COLONNXTDUE: 06/2014 (07/03/2009 14:48)

## 2010-03-25 NOTE — Progress Notes (Signed)
Summary: labs  Phone Note Call from Patient Call back at Home Phone [phone]   Caller: Patient Call For: nadel Reason for Call: Talk to Nurse Summary of Call: pt would like to come in for her bloodwork the week of the 6th for 09/12 appt.  She would also like SN to do a urine also.  Hasn't had one in a while. Initial call taken by: Eugene Gavia,  October 23, 2009 1:54 PM  Follow-up for Phone Call        labs in idx.  LM TCB to inform pt of this.  lab orders are good for 5 days, she can come in anytime next week but she MUST be fasting.  vit d, cbcd, bmet, lipid, hepatic, udip, tsh and sed. Boone Master CNA/MA  October 23, 2009 3:59 PM    pt called back.  Made pt aware labs were in computer for next week and she needed to be fasting.  Pt verbalized her understanding. Follow-up by: Eugene Gavia,  October 24, 2009 8:30 AM

## 2010-03-27 NOTE — Progress Notes (Signed)
Summary: prescription  Phone Note Call from Patient Call back at Home Phone [phone]   Caller: Patient Call For: DR Jetaun Colbath Summary of Call: Patient phoned and would like an antibiotic called into [facility] [address]. She has a fever and shortness of breath due to discomfort in the right side of chest and she has a sinus headache. Patient can be reached at [phone] Initial call taken by: Vedia Coffer,  March 19, 2010 9:56 AM  Follow-up for Phone Call        Spoke with pt.  She is c/o sinus HA, low grade temp 99.2, and rt side rib and back pain.  She states that she feels as if she is unable to take in a deep enough breath.  Denies any cough/wheeze or cp.  No openings in sched today. Pls advise thanks NKDA Follow-up by: Vernie Murders,  March 19, 2010 10:19 AM  Additional Follow-up for Phone Call Additional follow up Details #1::        per SN---ok for pt to have avelox 400mg   #7  1 by mouth once daily until gone. called and spoke with pt and she is aware of recs per SN Randell Loop CMA  March 19, 2010 1:45 PM     New/Updated Medications: AVELOX 400 MG TABS (MOXIFLOXACIN HCL) Take 1 tablet by mouth once a day until gone Prescriptions: AVELOX 400 MG TABS (MOXIFLOXACIN HCL) Take 1 tablet by mouth once a day until gone  #7 x 0   Entered by:   Randell Loop CMA   Authorized by:   Michele Mcalpine MD   Signed by:   Randell Loop CMA on 03/19/2010   Method used:   [facility]        [phone] (retail)       [address]       [place], [place]  [address]       Ph: [phone] or [phone]       Fax: [phone]   RxID:   [id number]

## 2010-04-07 ENCOUNTER — Encounter: Payer: Self-pay | Admitting: Pulmonary Disease

## 2010-04-16 ENCOUNTER — Telehealth: Payer: Self-pay | Admitting: Pulmonary Disease

## 2010-04-17 ENCOUNTER — Ambulatory Visit: Payer: Self-pay | Admitting: Adult Health

## 2010-04-22 NOTE — Progress Notes (Signed)
Summary: sick /fever/stomach  Phone Note Call from Patient Call back at [facility] Phone [phone]   Caller: Patient Call For: Hendrik Donath Summary of Call: 101. temp this am having abdominal pain since yesterday it got worse overnight wants to be seen Initial call taken by: Lacinda Axon,  April 16, 2010 8:07 AM  Follow-up for Phone Call        Spoke wiht pt and she staes on Monday night she began to have a fever of 101 and also having cramping and abdominal pain since monday as well. She denies N/V or diarrhea, just abdominal pain and cramping.  I advised the pt there are no appointments available in office today because TP and SN are out of office. I advised pt to go to urgent care or ER. SHe states she rather see a doctor that is familiar with her, so appt set fro tomorrow at 3:15pm with TP. Pt advised if pain gets worse or she has any new symptoms to go to ER or urgent care. Carron Curie CMA  April 16, 2010 10:07 AM

## 2010-04-30 ENCOUNTER — Other Ambulatory Visit: Payer: Self-pay | Admitting: Pulmonary Disease

## 2010-05-01 ENCOUNTER — Telehealth (INDEPENDENT_AMBULATORY_CARE_PROVIDER_SITE_OTHER): Payer: Self-pay | Admitting: *Deleted

## 2010-05-05 ENCOUNTER — Ambulatory Visit (INDEPENDENT_AMBULATORY_CARE_PROVIDER_SITE_OTHER)
Admission: RE | Admit: 2010-05-05 | Discharge: 2010-05-05 | Disposition: A | Discharge status: Home / Self Care | Payer: Medicare Other | Source: Ambulatory Visit | Attending: Pulmonary Disease | Admitting: Pulmonary Disease

## 2010-05-05 ENCOUNTER — Other Ambulatory Visit: Payer: Self-pay | Admitting: Pulmonary Disease

## 2010-05-05 ENCOUNTER — Ambulatory Visit (INDEPENDENT_AMBULATORY_CARE_PROVIDER_SITE_OTHER): Payer: Medicare Other | Admitting: Pulmonary Disease

## 2010-05-05 ENCOUNTER — Encounter: Payer: Self-pay | Admitting: Pulmonary Disease

## 2010-05-05 ENCOUNTER — Other Ambulatory Visit: Payer: Medicare Other

## 2010-05-05 DIAGNOSIS — J479 Bronchiectasis, uncomplicated: Secondary | ICD-10-CM

## 2010-05-05 DIAGNOSIS — K5732 Diverticulitis of large intestine without perforation or abscess without bleeding: Secondary | ICD-10-CM

## 2010-05-05 DIAGNOSIS — K5792 Diverticulitis of intestine, part unspecified, without perforation or abscess without bleeding: Secondary | ICD-10-CM

## 2010-05-05 DIAGNOSIS — E785 Hyperlipidemia, unspecified: Secondary | ICD-10-CM

## 2010-05-05 DIAGNOSIS — J471 Bronchiectasis with (acute) exacerbation: Secondary | ICD-10-CM

## 2010-05-05 DIAGNOSIS — J189 Pneumonia, unspecified organism: Secondary | ICD-10-CM

## 2010-05-05 DIAGNOSIS — M545 Low back pain, unspecified: Secondary | ICD-10-CM

## 2010-05-05 DIAGNOSIS — C50919 Malignant neoplasm of unspecified site of unspecified female breast: Secondary | ICD-10-CM

## 2010-05-05 DIAGNOSIS — D649 Anemia, unspecified: Secondary | ICD-10-CM

## 2010-05-05 DIAGNOSIS — M1990 Unspecified osteoarthritis, unspecified site: Secondary | ICD-10-CM

## 2010-05-05 DIAGNOSIS — R1032 Left lower quadrant pain: Secondary | ICD-10-CM

## 2010-05-05 DIAGNOSIS — M899 Disorder of bone, unspecified: Secondary | ICD-10-CM

## 2010-05-05 LAB — CBC WITH DIFFERENTIAL/PLATELET
Basophils Absolute: 0 10*3/uL (ref 0.0–0.1)
Basophils Relative: 0.4 % (ref 0.0–3.0)
Eosinophils Absolute: 0.1 10*3/uL (ref 0.0–0.7)
Eosinophils Relative: 1.8 % (ref 0.0–5.0)
HCT: 37 % (ref 36.0–46.0)
Hemoglobin: 12.8 g/dL (ref 12.0–15.0)
Lymphocytes Relative: 21.5 % (ref 12.0–46.0)
Lymphs Abs: 1.5 10*3/uL (ref 0.7–4.0)
MCHC: 34.5 g/dL (ref 30.0–36.0)
MCV: 94.8 fl (ref 78.0–100.0)
Monocytes Absolute: 0.6 10*3/uL (ref 0.1–1.0)
Monocytes Relative: 8.2 % (ref 3.0–12.0)
Neutro Abs: 4.9 10*3/uL (ref 1.4–7.7)
Neutrophils Relative %: 68.1 % (ref 43.0–77.0)
Platelets: 336 10*3/uL (ref 150.0–400.0)
RBC: 3.91 Mil/uL (ref 3.87–5.11)
RDW: 12.4 % (ref 11.5–14.6)
WBC: 7.2 10*3/uL (ref 4.5–10.5)

## 2010-05-05 LAB — BASIC METABOLIC PANEL
BUN: 16 mg/dL (ref 6–23)
CO2: 29 mEq/L (ref 19–32)
Calcium: 8.8 mg/dL (ref 8.4–10.5)
Chloride: 94 mEq/L — ABNORMAL LOW (ref 96–112)
Creatinine, Ser: 0.6 mg/dL (ref 0.4–1.2)
GFR: 107.73 mL/min (ref >60.00)
Glucose, Bld: 92 mg/dL (ref 70–99)
Potassium: 4.6 mEq/L (ref 3.5–5.1)
Sodium: 131 mEq/L — ABNORMAL LOW (ref 135–145)

## 2010-05-05 LAB — SEDIMENTATION RATE: Sed Rate: 83 mm/hr — ABNORMAL HIGH (ref 0–22)

## 2010-05-06 NOTE — Progress Notes (Signed)
Summary: cxr  Phone Note Call from Patient Call back at Home Phone [phone]   Caller: Patient Call For: Miranda Thompson Summary of Call: Pt has appt with SN on 3/12 wants to have her cxr prior to this pls advise.  Initial call taken by: Darletta Moll,  May 01, 2010 10:51 AM  Follow-up for Phone Call        Called, spoke with pt.  She has 6 month f/u scheduled for 05/05/10 with SN.  Would like to come in tomorrow to have CXR.  Dr. Kriste Basque, is this ok?  Follow-up by: Gweneth Dimitri RN,  May 01, 2010 11:20 AM  Additional Follow-up for Phone Call Additional follow up Details #1::        per SN---it can be done when she arrives that day if she wants.  thanks Renaldo Fiddler CMA  May 01, 2010 12:30 PM   Pt aware she can have CXR when she arrives and verbalized understanding.  Gweneth Dimitri RN  May 01, 2010 3:36 PM     Additional Follow-up for Phone Call Additional follow up Details #2::    ATC pt.  Line busy x 3.  WCB  Megan Reynolds LPN  May 01, 979 2:47 PM

## 2010-05-07 ENCOUNTER — Ambulatory Visit (INDEPENDENT_AMBULATORY_CARE_PROVIDER_SITE_OTHER)
Admission: RE | Admit: 2010-05-07 | Discharge: 2010-05-07 | Disposition: A | Discharge status: Home / Self Care | Payer: Medicare Other | Source: Ambulatory Visit | Attending: Pulmonary Disease | Admitting: Pulmonary Disease

## 2010-05-07 DIAGNOSIS — R1032 Left lower quadrant pain: Secondary | ICD-10-CM

## 2010-05-07 DIAGNOSIS — K5792 Diverticulitis of intestine, part unspecified, without perforation or abscess without bleeding: Secondary | ICD-10-CM

## 2010-05-07 DIAGNOSIS — K5732 Diverticulitis of large intestine without perforation or abscess without bleeding: Secondary | ICD-10-CM

## 2010-05-07 HISTORY — DX: Malignant neoplasm of unspecified site of left female breast: C50.912

## 2010-05-07 MED ORDER — IOHEXOL 300 MG/ML  SOLN
80.0000 mL | Freq: Once | INTRAMUSCULAR | Status: AC | PRN
  Administered 2010-05-07: 80 mL via INTRAVENOUS

## 2010-05-09 ENCOUNTER — Telehealth: Payer: Self-pay | Admitting: Pulmonary Disease

## 2010-05-13 NOTE — Progress Notes (Addendum)
Summary: ct results  Phone Note Call from Patient Call back at Home Phone [phone]   Caller: Patient Call For: nadel Reason for Call: Talk to Nurse Summary of Call: Requesting CT results from Wedensday. Initial call taken by: Lehman Prom,  May 09, 2010 4:11 PM  Follow-up for Phone Call        pt requesting ct results. please advise.Carron Curie CMA  May 09, 2010 4:51 PM Patients husband phoned stated that they called an hour ago and he wants the results of his wifes CT they would really like them today. They can be reached at [phone].Vedia Coffer  May 09, 2010 5:14 PM   Additional Follow-up for Phone Call Additional follow up Details #1::        I called pt & discussed CT w/ mild sigm diverticulitis & ovarian cyst... copy to pt & GYN Romaine. Additional Follow-up by: Michele Mcalpine MD,  May 09, 2010 6:15 PM

## 2010-05-13 NOTE — Assessment & Plan Note (Signed)
Summary: 6 month rov   CC:  6 month ROV & review of mult medical problems....  History of Present Illness: 77 y/o WF here for a follow up visit... she has mult med problems as noted below...    ~  [name]:  she notes self limited episodes of hemoptysis  ~1/mo... she continues to do her postural drainage, Advair, Mucinex, Fluids, etc & is very productive w/ copious thick green sputum regularly... she continues to exercise regularly (walks 51mi/d) & takes numerous herbs etc- the latest is Tumeric "it's good for arthritis" per the [facility]...  last sputum cultures= 8/10- NTF, & neg AFB...  she is under some stress w/ neice recently Dx w/ cancer... **CXR today>> diffuse changes, scarring, bronchiectasis, stable-NAD...   ~  Sep11:  63mo ROV- recent upper resp infection w/ incr cough, green gray phlegm, no blood, temp to 100, no CP/ ch in SOB- she toughed it out & is now improved w/o intercurrent antibiotic Rx... she does note incr fatigue & recent labs showed UTI> we will Rx w/ Cipro for this & bronchiectasis...  she notes that her ortho symptoms are improved w/ the Tumeric & Glucosamine, & prev dermatitis resolved w/ "Black Current"... advised to remain on the Calcium, MVI, Vit D daily... she had colonoscopy 5/11 +divertics only.   ~  May 05, 2010:    Presents w/ a "rough 3-4 weeks"> abd pain LLQ & across lower abd, no ch in bowels & no blood seen, went to see GYN DrRomaine & Sonar showed left colon swollen, given Cipro 2/22-29 but still febrile, still in pain so DrRomaine gave her another round of Cipro 3/7==> ;  exam shows LLQ tender w/o guarding or rebound & all c/w sigm diverticulitis >> suggested CT Abd & adding FLAGYL + low residue diet to the regimen, ?f/u w/ GI pending CT results.    She has mod severe underlying bronchiectasis & granulomatous lung dis> stable on her regimen of Advair, Mucinex, chestPT/ post drainage;  due for her yearly f/u CXR today = chr pulm scarring, NAD;  Labs showed  Chems OK x Na 131, CBC OK w/ Hg 12.8, WBC 7.2, Sed 83...     She had an interval BMD 10/11 at [facility] showing TScores +0.3 in Spine, -0.8 in left FemNeck, & -2.1 right forearm (improved in spine but worse in hip & forearm);  treated by DrRomaine on Calcium, MVI, Vit D 1000, and numerous herbs...    Current Problem List:  ALLERGIC RHINITIS (ICD-477.9) - she uses OTC antihistamines Prn.  BRONCHIECTASIS (ICD-494.0) & Hx of PNEUMONIA (ICD-486) - on ADVAIR 250 Bid, & MUCINEX 1-2Bid...  Long hx severe bronchiectasis, granulomatous lung dis, and pneumonia (initially Dx 1968 by DrFrazier, subseq bx= noncaseating gran dz & chr inflam)... her last bronchoscopy was 4/04 revealing some mucus plugging... CTChest 4/04 showed marked bronchiectasis in lingular, LLL, RML, and mucus plugging... CT repeated 6/06- similar.  ~  Sputum cultures 5/08 grew Serratia (R to amox/ceph, S to all others), branching part acid fast bacilli resembling nocardia, yeast c/w candida...  ~  CXR 1/09 chr lung dis, no change from prev w/ left mid lung zone as the worst area...  ~  CXR 3/10 w/ chr lung changes and NAD suspected...  ~  Sputum 8/10 showed NTF only & AFB smears & cult all neg...  ~  CXR 3/11 showed chr lung dis, no acute changes, stable...  HYPERCHOLESTEROLEMIA, BORDERLINE (ICD-272.4) - on diet alone...  ~  FLP 9/10 showed  TChol 174, TG 64, HDL 49, LDL 112  ~  FLP 9/11 showed TChol 175, TG 61, HDL 48, LDL 115  Hx of ADENOCARCINOMA, BREAST (ICD-174.9) - S/P surgery 5/94 [facility] w/ wide excision L breast lesion and LN dissection (stage II 1.0  cm tumor), w/ post op XRT and chemotherapy... she was followed by [facility] for oncology... then DrLivesay (released in 2003).  ~  routine Mammogram 2/10 was neg- f/u planned 61yr.  ~  f/u mammogram 2/11 = neg, NAD, f/u planned 47yr.  DIVERTICULOSIS OF COLON (ICD-562.10) - colonoscopy 3/05 by DrPatterson showed divertics only... she has +fam hx colon ca w/ colon check q46yrs... f/u  colonoscopy 5/11 showed +divertics, no polyps...  DEGENERATIVE JOINT DISEASE (ICD-715.90) - see Ortho eval 2010 by DrDaldorf... she takes Glucosamine & Herbal supplement- milk thistle/ tumeric which she says really helps...  LOW BACK PAIN SYNDROME (ICD-724.2) - LBP and eval by [facility] 2010 w/ MRI showed herniated disc L3-4 w/ mod spinal stenosis and lat recess stenosis bilat...   ROTATOR CUFF SYNDROME, RIGHT (ICD-726.10) - had injection by [facility] 10/07 w/ improvement.  OSTEOPENIA (ICD-733.90) - last BMD at [facility] 10/06 showed norm spine, norm hip, osteopenic forearm (-2.0)... followed by GYN = DrRomaine who is treating a low Vit D level... she takes calcium, Vitamins, & Vit D 1000u daily from [place]...  ~  labs 9/10 on 50K weekly showed Vit D level = 42... rec> change to 1000 u daily.  ~  labs 9/11 on 1000 u daily showed Vit D level = 34... rec> continue daily supplement.  Hx of ANEMIA (ICD-285.9) -  resolved...  ~  labs 9/10 showed Hg= 13.6  ~  labs 9/11 showed Hg= 13.2  ECZEMA - she has hx of eczema on her hands and has used CoQ10 and Borage Oil as rec by an Dentist in [place]... recently changed to [facility] Current instead of these other supplements & rash is much improved...   Preventive Screening-Counseling & Management  Alcohol-Tobacco     Smoking Status: quit     Year Quit: 1969  Allergies (verified): No Known Drug Allergies  Comments:  Nurse/Medical Assistant: The patient's medications and allergies were reviewed with the patient and were updated in the Medication and Allergy Lists.  Past History:  Past Medical History: ALLERGIC RHINITIS (ICD-477.9) BRONCHIECTASIS WITH ACUTE EXACERBATION (ICD-494.1) HEMOPTYSIS (ICD-786.3) BRONCHIECTASIS (ICD-494.0) Hx of PNEUMONIA (ICD-486) HYPERCHOLESTEROLEMIA, BORDERLINE (ICD-272.4) Hx of ADENOCARCINOMA, BREAST (ICD-174.9) DIVERTICULOSIS OF COLON (ICD-562.10) DEGENERATIVE JOINT DISEASE (ICD-715.90) LOW BACK PAIN  SYNDROME (ICD-724.2) ROTATOR CUFF SYNDROME, RIGHT (ICD-726.10) OSTEOPENIA (ICD-733.90) ANEMIA (ICD-285.9)  Past Surgical History: S/P lung biopsy by TSurg DrFrazier 1968 S/P left breast lumpectomy & LN dissection 5/[facility]  Family History: Reviewed history from 05/07/2008 and no changes required. Mother deceased age 55 from heart attack Father deceased age 61 from colon cancer 1 Sibling alive age 62  hx of HBP, high triglycerids  Social History: Reviewed history from 11/04/2009 and no changes required. quit smoking in 1969  smoked for 40 years exercises 6 times per week no caffeine use etoh--1 drink per week married 5 children  Review of Systems      See HPI       The patient complains of dyspnea on exertion and abdominal pain.  The patient denies anorexia, fever, weight loss, weight gain, vision loss, decreased hearing, hoarseness, chest pain, syncope, peripheral edema, prolonged cough, headaches, hemoptysis, melena, hematochezia, severe indigestion/heartburn, hematuria, incontinence, muscle weakness, suspicious skin lesions, transient blindness, difficulty walking, depression, unusual  weight change, abnormal bleeding, enlarged lymph nodes, and angioedema.    Vital Signs:  Patient profile:   77 year old female Height:      64 inches Weight:      140.50 pounds BMI:     24.20 O2 Sat:      98 % on Room air Temp:     97.4 degrees F oral Pulse rate:   92 / minute BP sitting:   122 / 60  (left arm) Cuff size:   regular  Vitals Entered By: Randell Loop CMA (May 05, 2010 2:07 PM)  O2 Sat at Rest %:  98 O2 Flow:  Room air CC: 6 month ROV & review of mult medical problems... Is Patient Diabetic? No Pain Assessment Patient in pain? yes      Comments meds updated today with pt   Physical Exam  Additional Exam:  WD, WN, 77 y/o WF in NAD... GENERAL:  Alert & oriented; pleasant & cooperative... HEENT:  [place]/AT, EOM-wnl, PERRLA, EACs-clear, TMs-wnl, NOSE-clear,  THROAT-clear & wnl. NECK:  Supple w/ fairROM; no JVD; normal carotid impulses w/o bruits; no thyromegaly or nodules palpated; no lymphadenopathy. CHEST:  scat bilat rhonchi without wheezing, rales or signs of consolidation... HEART:  Regular Rhythm; without murmurs/ rubs/ or gallops heard... ABDOMEN:  Soft w/ mild tender LLQ; normal bowel sounds; no organomegaly or masses detected. EXT: without deformities or arthritic changes; no varicose veins/ venous insuffic/ or edema. NEURO:  CN's intact;  no focal neuro deficits... DERM:  No lesions noted; no rash etc...    Impression & Recommendations:  Problem # 1:  DIVERTICULITIS, ACUTE (ICD-562.11) CT confirms acute diverticulitis LLQ but why the persist symptoms w/ fever etc x 3-4 weeks? No abscess on CT scan, WBC=7.2 w/ 68% polys, Sed=83 but hx bronchiectasis w/ Sed= 45-67 prev... REC>  Add FLAGYL 500mg  Tid to the Cipro from [facility]; low residue diet, if not 100% then f/u w/ GI... Orders: Radiology Referral (Radiology) TLB-BMP (Basic Metabolic Panel-BMET) (80048-METABOL) TLB-CBC Platelet - w/Differential (85025-CBCD) TLB-Sedimentation Rate (ESR) (85652-ESR)  Problem # 2:  BRONCHIECTASIS (ICD-494.0) Her yearly f/u CXR is stable> chr changes, NAD... continue current therapy. Orders: T-2 View CXR (71020TC)  Problem # 3:  Hx of ADENOCARCINOMA, BREAST (ICD-174.9) She was released by drLivesay in 2003, yearly mammograms have been neg, she does monthly self checks and no suspicious findings...  Problem # 4:  LOW BACK PAIN SYNDROME (ICD-724.2) She has DJD, LBP & followed by DrDaldorf> stable on her exercise programand herbal supplements. Her updated medication list for this problem includes:    Aspirin Adult Low Strength 81 Mg Tbec (Aspirin) .Marland Kitchen... Take 1 tablet by mouth once a day    Aleve 220 Mg Tabs (Naproxen sodium) .Marland Kitchen... As needed  Problem # 5:  OSTEOPENIA (ICD-733.90) BMDs followed by GYN & rx w/ Calcium, MVI, Vit D &  supplements...  Problem # 6:  Other Medical Problems as noted>>>  Complete Medication List: 1)  Aspirin Adult Low Strength 81 Mg Tbec (Aspirin) .... Take 1 tablet by mouth once a day 2)  Advair Diskus 250-50 Mcg/dose Misc (Fluticasone-salmeterol) .... Use one puff twice daily-  rinse mouth after each use 3)  Mucinex Maximum Strength 1200 Mg Tb12 (Guaifenesin) .... Take one tablet by mouth once daily 4)  Vagifem 25 Mcg Tabs (Estradiol) .... Use 2 times per week 5)  Aleve 220 Mg Tabs (Naproxen sodium) .... As needed 6)  Glucosamine-chondroitin 500-400 Mg Caps (Glucosamine-chondroitin) .... Take 1 tablet by mouth once  a day 7)  Citracal Maximum 315-250 Mg-unit Tabs (Calcium citrate-vitamin d) .... Take 1 tablet by mouth once a day 8)  Multivitamins Tabs (Multiple vitamin) .... Take 1 tablet by mouth once a day 9)  Vitamin D 1000 Unit Tabs (Cholecalciferol) .... Take 1 tab by mouth once daily... 10)  Black Current 1000mg   .... Take 1 tablet by mouth once a day 11)  Silymarin Caps (Milk thistle-turmeric) .... Take 2 tablets by mouth once daily 12)  Cipro 500 Mg Tabs (Ciprofloxacin hcl) .... Take 1 tab by mouth three times a day til gone... 13)  Metronidazole 500 Mg Tabs (Metronidazole) .... Take 1 tab by mouth three times a day til gone...  Patient Instructions: 1)  Today we updated your med list- see below.... 2)  Your f/u CXR looks stable... 3)  For your Abd Pain (prob diverticulitis):  4)   We will check blood work & a CT scan of your abd...  5)  we will call you w/ the results when avail.Marland KitchenMarland Kitchen  6)  follow a LOW RESIDUE diet for now,,,  7)  take the CIPRO & FLAGYL (Metronidazole) 3 times daily til gone...  8)  call for any questions... Prescriptions: METRONIDAZOLE 500 MG TABS (METRONIDAZOLE) take 1 tab by mouth three times a day til gone...  #30 x 0   Entered and Authorized by:   Michele Mcalpine MD   Signed by:   Michele Mcalpine MD on 05/05/2010   Method used:   Print then Give to Patient    RxID:   [id number]

## 2010-06-06 ENCOUNTER — Telehealth: Payer: Self-pay | Admitting: Pulmonary Disease

## 2010-06-06 MED ORDER — LEVOFLOXACIN 500 MG PO TABS: ORAL_TABLET | ORAL | Status: DC

## 2010-06-06 NOTE — Telephone Encounter (Signed)
Hold Aspirin for 5 days.  BEgin Levaquin 500mg  daily for 10 days #10, no refills.  Need to set up ov for follow up to make sure getting better in  2 weeks  Please contact office for sooner follow up if symptoms do not improve or worsen or seek emergency care   If not improving or worsens will need to be seen or go to ER.

## 2010-06-06 NOTE — Telephone Encounter (Signed)
Spoke w/ pt and she states she did her postural drainage earlier and every since she has been coughing up bright red blood. Pt states it is about 2 1/2 quarter size of globs of phlem. Pt is having some chest tightness and thinks she may have a low grade fever. I advised pt she needed to either go be evaluated from urgent care or go to the ED and pt states she would wait until Monday and see if the blood was any better. I advised pt that is not something that can wait until Monday. SInce SN is out of the office pt would like me forward to TP for recs for pt since pt has seen her before. Please advise Tammy Thanks.  KNDA  Carver Fila, CMA

## 2010-06-06 NOTE — Telephone Encounter (Signed)
Spoke w/ pt and advised pt of TP recs. Pt verbalized understanding and is coming in to see TP 06/19/10 at 11:30 for follow up on this since SN was completley booked up. Pt aware rx was sent to pharmacy.

## 2010-06-10 ENCOUNTER — Other Ambulatory Visit: Payer: Self-pay | Admitting: Pulmonary Disease

## 2010-06-19 ENCOUNTER — Ambulatory Visit: Payer: Medicare Other | Admitting: Adult Health

## 2010-07-11 NOTE — Op Note (Signed)
NAME:  Miranda Thompson, Miranda Thompson                        ACCOUNT NO.:  [id number]   MEDICAL RECORD NO.:  [id number]                   PATIENT TYPE:  AMB   LOCATION:  CARD                                 FACILITY:  [facility]   PHYSICIAN:  Lonzo Cloud. Kriste Basque, M.D. [facility]            DATE OF BIRTH:  06-06-1933   DATE OF PROCEDURE:  06/06/2002  DATE OF DISCHARGE:                                 OPERATIVE REPORT   PROCEDURE:  Bronchoscopy.   INDICATIONS:  The patient is a 77 year old white female with known  bronchiectasis.  She recently presented with a new peripheral right lung  infiltrate.  Sputum cultures were nonrevealing.  She was treated with broad-  spectrum antibiotics with little change in the peripheral opacity.  CT scan  showed diffuse bronchiectasis, predominant in lingula and right middle lobe,  and a peripheral right lung opacity that seemed to suspend inspissated mucus  plugs in the airways.  Bronchoscopy is undertaken at the present time to  evaluate those airways and for bronchial lavage to obtain deeper material  for culture.   PREMEDICATION AND ANESTHESIA:  Bronchoscopy was performed in the pulmonary  lab without fluoroscopy.  IV was started with D5W at [facility].  Cardiac  monitoring was done, revealing normal sinus rhythm and sinus tachycardia  without ectopics.  Oxygen was administered at 4 L/min. by nasal cannula.  Continuous finger pulse oximetry was performed throughout the test.   Topical anesthesia was obtained with lidocaine 300 mg given LMA during the  test.  General anesthesia was obtained with 5 mg of Versed given IV slowly  during the procedure.  Cough anesthesia was obtained with Demerol 50 mg IV  given IV slowly during the test.  The patient tolerated the procedure well.   FINDINGS:  1. Upper airway and cords:  The upper airway, including the nasopharynx and     posterior pharynx, was normal as far as could be seen.  There was no     evidence of any oropharyngeal thrush, etc.   The vocal cords appeared     normal and moved normally with phonation.  2. Trachea and carina:  The trachea was normal and the carina was sharp.  3. Bronchi:  There was essentially normal endobronchial anatomy throughout.     There were no endobronchial lesions seen anywhere in the tracheobronchial     tree.  The bronchial mucosa was mildly to moderately inflamed in a     diffuse fashion and mucosa in several areas appeared to be moderately     friable.  Several small mucus plugs could be seen distally, and these     were lavaged clear with saline.  Careful attention was directed to the     right side with no endobronchial lesions seen distally.  Copious amounts     of saline were used to lavage the right upper lobe, right middle lobe,     and right  lower lobe, and then the left side as well.  The washings were     sent for analysis.   IMPRESSION:  History of bilateral bronchiectasis with new peripheral right  infiltrate of uncertain etiology.  Bronchial washings obtained for cytology  and cultures, including routine, AFB, and fungi.                                               Lonzo Cloud. Kriste Basque, M.D. [facility]    SMN/MEDQ  D:  06/06/2002  T:  06/06/2002  Job:  [id number]

## 2010-09-04 ENCOUNTER — Telehealth: Payer: Self-pay | Admitting: Pulmonary Disease

## 2010-09-04 NOTE — Telephone Encounter (Signed)
Pt plans to visit daughter in [place] at the end of July or first of August and would like recs from SN due to her COPD. She is fine to wait until next week when SN returns to the office. Will forward to [employer].

## 2010-09-11 NOTE — Telephone Encounter (Signed)
Per SN---a trip to [place] is similar to an airline flight---the barometric pressure is slightly lower and therefore oxygen levels slightly lower----she should not go into the [place] and she should not exert herself too much, otherwise she should do fine.

## 2010-09-11 NOTE — Telephone Encounter (Signed)
Pt advised. Jennifer Castillo, CMA  

## 2010-09-26 ENCOUNTER — Other Ambulatory Visit: Payer: Self-pay | Admitting: Pulmonary Disease

## 2010-11-10 ENCOUNTER — Encounter: Payer: Self-pay | Admitting: Pulmonary Disease

## 2010-11-10 ENCOUNTER — Ambulatory Visit (INDEPENDENT_AMBULATORY_CARE_PROVIDER_SITE_OTHER): Payer: Medicare Other | Admitting: Pulmonary Disease

## 2010-11-10 DIAGNOSIS — K5730 Diverticulosis of large intestine without perforation or abscess without bleeding: Secondary | ICD-10-CM

## 2010-11-10 DIAGNOSIS — E785 Hyperlipidemia, unspecified: Secondary | ICD-10-CM

## 2010-11-10 DIAGNOSIS — M899 Disorder of bone, unspecified: Secondary | ICD-10-CM

## 2010-11-10 DIAGNOSIS — M1990 Unspecified osteoarthritis, unspecified site: Secondary | ICD-10-CM

## 2010-11-10 DIAGNOSIS — J479 Bronchiectasis, uncomplicated: Secondary | ICD-10-CM

## 2010-11-10 DIAGNOSIS — M545 Low back pain, unspecified: Secondary | ICD-10-CM

## 2010-11-10 DIAGNOSIS — C50919 Malignant neoplasm of unspecified site of unspecified female breast: Secondary | ICD-10-CM

## 2010-11-10 NOTE — Progress Notes (Signed)
Subjective:    Patient ID: Miranda Thompson, female    DOB: 1933-03-22, 77 y.o.   MRN: [id number]  HPI 77 y/o WF here for a follow up visit... she has mult med problems as noted below...   ~  [name]:  she notes self limited episodes of hemoptysis ~1/mo... she continues to do her postural drainage, Advair, Mucinex, Fluids, etc & is very productive w/ copious thick green sputum regularly... she continues to exercise regularly (walks 37mi/d) & takes numerous herbs etc- the latest is Tumeric "it's good for arthritis" per the [facility]...  last sputum cultures= 8/10- NTF, & neg AFB...  she is under some stress w/ neice recently Dx w/ cancer... CXR today>> diffuse changes, scarring, bronchiectasis, stable-NAD...  ~  Sep11:  23mo ROV- recent upper resp infection w/ incr cough, green gray phlegm, no blood, temp to 100, no CP/ ch in SOB- she toughed it out & is now improved w/o intercurrent antibiotic Rx... she does note incr fatigue & recent labs showed UTI> we will Rx w/ Cipro for this & bronchiectasis...  she notes that her ortho symptoms are improved w/ the Tumeric & Glucosamine, & prev dermatitis resolved w/ "Black Current"... advised to remain on the Calcium, MVI, Vit D daily... she had colonoscopy 5/11 +divertics only.  ~  May 05, 2010:  23mo ROV & presents w/ a "rough 3-4 weeks"> abd pain LLQ & across lower abd, no ch in bowels & no blood seen, went to see GYN DrRomaine & Sonar showed left colon swollen, given Cipro 2/22-29 but still febrile, still in pain so DrRomaine gave her another round of Cipro 3/7==> ;  exam shows LLQ tender w/o guarding or rebound & all c/w sigm diverticulitis >> suggested CT Abd & adding FLAGYL + low residue diet to the regimen, ?f/u w/ GI pending CT results.    She has mod severe underlying bronchiectasis & granulomatous lung dis> stable on her regimen of Advair, Mucinex, chestPT/ post drainage;  due for her yearly f/u CXR today = chr pulm scarring, NAD;  Labs showed [place] OK  x Na 131, CBC OK w/ Hg 12.8, WBC 7.2, Sed 83...     She had an interval BMD 10/11 at [facility] showing TScores +0.3 in Spine, -0.8 in left FemNeck, & -2.1 right forearm (improved in spine but worse in hip & forearm);  treated by DrRomaine on Calcium, MVI, Vit D 1000, and numerous herbs...  ~  November 10, 2010:  23mo ROV & she continues stable overall; she had to cancel her planned trip to [place] when her niece passed away w/ melanoma;  Prev bout of diverticulitis resolved w/ cipro & Flagyl and back to baseline;  She continues her daily postural drain w/ copious sputum production (no recent cultures) & he same bronchodilator regimen; no new complaints or concerns...          Problem List:  ALLERGIC RHINITIS (ICD-477.9) - she uses OTC antihistamines Prn.  BRONCHIECTASIS (ICD-494.0) & Hx of PNEUMONIA (ICD-486) - on ADVAIR 250 Bid, & MUCINEX 1-2Bid...  Long hx severe bronchiectasis, granulomatous lung dis, and pneumonia (initially Dx 1968 by DrFrazier, subseq bx= noncaseating gran dz & chr inflam)... her last bronchoscopy was 4/04 revealing some mucus plugging... CTChest 4/04 showed marked bronchiectasis in lingular, LLL, RML, and mucus plugging... CT repeated 6/06- similar. ~  Sputum cultures 5/08 grew Serratia (R to amox/ceph, S to all others), branching part acid fast bacilli resembling nocardia, yeast c/w candida... ~  CXR 1/09 chr  lung dis, no change from prev w/ left mid lung zone as the worst area... ~  CXR 3/10 w/ chr lung changes and NAD suspected... ~  Sputum 8/10 showed NTF only & AFB smears & cult all neg... ~  CXR 3/11 showed chr lung dis, no acute changes, stable... ~  CXR 3/1`2 showed chronic changes, scarring, NAD...  HYPERCHOLESTEROLEMIA, BORDERLINE (ICD-272.4) - on diet alone... ~  FLP 9/10 showed TChol 174, TG 64, HDL 49, LDL 112 ~  FLP 9/11 showed TChol 175, TG 61, HDL 48, LDL 115  Hx of ADENOCARCINOMA, BREAST (ICD-174.9) - S/P surgery 5/94 [facility] w/ wide excision L breast  lesion and LN dissection (stage II 1.0  cm tumor), w/ post op XRT and chemotherapy... she was followed by [facility] for oncology... then DrLivesay (released in 2003). ~  routine Mammogram 2/10 was neg- f/u planned 81yr. ~  f/u mammogram 2/11 = neg, NAD, f/u planned 73yr. ~  f/u mammogram 2/12 = neg, NAD, f/u 65yr.  DIVERTICULOSIS OF COLON (ICD-562.10) - colonoscopy 3/05 by DrPatterson showed divertics only... she has +fam hx colon ca w/ colon check q18yrs... f/u colonoscopy 5/11 showed +divertics, no polyps... ~  3/12:  Hx, exam & CTAbd c/w diverticulitis; treated w/ Cipro/ Flagyl & resolved;  Advised Metamucil, Miralax, etc.  DEGENERATIVE JOINT DISEASE (ICD-715.90) - see Ortho eval 2010 by DrDaldorf... she takes Glucosamine & Herbal supplement- milk thistle/ tumeric which she says really helps...  LOW BACK PAIN SYNDROME (ICD-724.2) - LBP and eval [facility] w/ MRI showed herniated disc L3-4 w/ mod spinal stenosis and lat recess stenosis bilat...   ROTATOR CUFF SYNDROME, RIGHT (ICD-726.10) - had injection by [facility] 10/07 w/ improvement.  OSTEOPENIA (ICD-733.90) - last BMD at [facility] 10/06 showed norm spine, norm hip, osteopenic forearm (-2.0)... followed by GYN = DrRomaine who is treating a low Vit D level... she takes calcium, Vitamins, & Vit D 1000u daily from [place]... ~  labs 9/10 on 50K weekly showed Vit D level = 42... rec> change to 1000 u daily. ~  labs 9/11 on 1000 u daily showed Vit D level = 34... rec> continue daily supplement.  Hx of ANEMIA (ICD-285.9) -  resolved... ~  labs 9/10 showed Hg= 13.6 ~  labs 9/11 showed Hg= 13.2  ECZEMA - she has hx of eczema on her hands and has used CoQ10 and Borage Oil as rec by an Dentist in [place]... recently changed to [facility] Current instead of these other supplements & rash is much improved...   Past Surgical History  Procedure Date  . Lung biopsy 1968    TSurg---Dr. Bascom Levels  . Left breast lumpectomy and ln dissection 06/1992      Dr. Ezzard Standing    Outpatient Encounter Prescriptions as of 11/10/2010  Medication Sig Dispense Refill  . ADVAIR DISKUS 250-50 MCG/DOSE AEPB TAKE 1 PUFF TWICE A DAY  1 each  5  . aspirin 81 MG tablet Take 81 mg by mouth daily.        Marland Kitchen BLACK CURRANT SEED OIL PO Take 1 tablet by mouth daily.        . Calcium Citrate-Vitamin D (CITRACAL MAXIMUM) 315-250 MG-UNIT TABS Take 1 tablet by mouth daily.        Marland Kitchen estradiol (VAGIFEM) 25 MCG vaginal tablet Use 2 times per week       . glucosamine-chondroitin 500-400 MG tablet Take 1 tablet by mouth daily.        . Misc Natural Products (SILYMARIN) 50 MG  CAPS Take 2 capsules by mouth daily.        . Multiple Vitamins-Minerals (MULTIVITAMIN & MINERAL PO) Take 1 tablet by mouth daily.        . naproxen sodium (ANAPROX) 220 MG tablet Take 220 mg by mouth 2 (two) times daily with a meal.        . Pseudoephedrine-Guaifenesin (MUCINEX D) [phone] MG TB12 Take 1 tablet by mouth daily.        Marland Kitchen DISCONTD: cholecalciferol (VITAMIN D) 1000 UNITS tablet Take 1,000 Units by mouth daily.        Marland Kitchen DISCONTD: levofloxacin (LEVAQUIN) 500 MG tablet One tablet once a day  10 tablet  0    No Known Allergies   Current Medications, Allergies, Past Medical History, Past Surgical History, Family History, and Social History were reviewed in [employer] record.     Review of Systems         See HPI - all other systems neg except as noted...  The patient complains of dyspnea on exertion and abdominal pain.  The patient denies anorexia, fever, weight loss, weight gain, vision loss, decreased hearing, hoarseness, chest pain, syncope, peripheral edema, prolonged cough, headaches, hemoptysis, melena, hematochezia, severe indigestion/heartburn, hematuria, incontinence, muscle weakness, suspicious skin lesions, transient blindness, difficulty walking, depression, unusual weight change, abnormal bleeding, enlarged lymph nodes, and angioedema.     Objective:    Physical Exam     WD, WN, 77 y/o WF in NAD... GENERAL:  Alert & oriented; pleasant & cooperative... HEENT:  [place]/AT, EOM-wnl, PERRLA, EACs-clear, TMs-wnl, NOSE-clear, THROAT-clear & wnl. NECK:  Supple w/ fairROM; no JVD; normal carotid impulses w/o bruits; no thyromegaly or nodules palpated; no lymphadenopathy. CHEST:  scat bilat rhonchi without wheezing, rales or signs of consolidation... HEART:  Regular Rhythm; without murmurs/ rubs/ or gallops heard... ABDOMEN:  Soft w/ mild tender LLQ; normal bowel sounds; no organomegaly or masses detected. EXT: without deformities or arthritic changes; no varicose veins/ venous insuffic/ or edema. NEURO:  CN's intact;  no focal neuro deficits... DERM:  No lesions noted; no rash etc...   Assessment & Plan:   COPD/ Bronchiectasis/ Hx pneumonia>  On Advair250, Mucinex2Bid, Fluids & vigorous Chest PT/ postural drainage; given sputum specimen cups for sterile collection for C&S if sputum changes or the need arises...  CHOL>  On diet alone & aske to ret fasting for f/u FLP...  Breast Cancer>  S/p surg by [facility] in 94 & oncology f/u by DrNeijstrom, then [facility] 2003 & released; yearly mammograms & monthly self exams were wnl...  Divertics>  She had acute diverticulitis 3/12 & resolved w/ diet adjust & Cipro/Flagyl, now back to baseline & advised re- Metamucil, Fiberall, etc...  DJD/ LBP>  Ortho is DrDaldorf & she takes Glucosamine & Herbs and doing satis she says...  Osteopenia/ Vit D defic>  On Calcium, MVI, Vit D supplement; followed by GYN= DrRomaine...  Other medical issues as noted.Marland KitchenMarland Kitchen

## 2010-11-10 NOTE — Patient Instructions (Signed)
Today we updated your med list in EPIC...     Continue your current meds the same...  Call for any problems...  Let's plan a follow up visit in 6 months & we'll plan fasting blood work & CXR at that time.Marland KitchenMarland Kitchen

## 2010-11-24 ENCOUNTER — Encounter: Payer: Self-pay | Admitting: Pulmonary Disease

## 2010-12-23 ENCOUNTER — Telehealth: Payer: Self-pay | Admitting: Pulmonary Disease

## 2010-12-23 DIAGNOSIS — R059 Cough, unspecified: Secondary | ICD-10-CM

## 2010-12-23 DIAGNOSIS — R05 Cough: Secondary | ICD-10-CM

## 2010-12-23 NOTE — Telephone Encounter (Signed)
Per SN---last sputum culture was 09/2008    She will need to come in first thing in the morning for sputum culture and afb.  We will send in levaquin 500mg    Take 1 1/2 tablets daily x 10 days. Pt is aware that this will be sent in for her in the morning and she will need to stop by in the morning for the sputum sample.

## 2010-12-23 NOTE — Telephone Encounter (Signed)
Spoke with patient-states she has low BP last night(99/54) and pulse of 100; then today her BP was 12-/84 this morning and again this afternoon BP was 108/62 with pulse of 89-pt is c/o slight light headed feeling and chest congestion with productive cough-dark green phlegm with streaks of blood at times. Denies any SOB or wheezing.  Pt had High fever of 101.6 this morning and most recent temp of 99.6 after taking Tylenol. Would like SN's recs of what to take if anything.

## 2010-12-24 MED ORDER — LEVOFLOXACIN 500 MG PO TABS: ORAL_TABLET | ORAL | Status: DC

## 2010-12-31 ENCOUNTER — Telehealth: Payer: Self-pay | Admitting: Pulmonary Disease

## 2010-12-31 DIAGNOSIS — K5792 Diverticulitis of intestine, part unspecified, without perforation or abscess without bleeding: Secondary | ICD-10-CM

## 2010-12-31 NOTE — Telephone Encounter (Signed)
Pt aware we will send an order for pt to be seen in GI for diverticulitis. She states Dr. Jarold Motto has done her past colonoscopy.

## 2010-12-31 NOTE — Telephone Encounter (Signed)
Per SN---pt will need GI eval and follow up for recurrent diverticulitis.  Pt has seen Dr. Russella Dar in the past.

## 2010-12-31 NOTE — Telephone Encounter (Signed)
Spoke with pt. She states that she recently had another episode of diverticulitis and she states that her GYN advised that SN may want to see her. She states that she still has some mild abd pain and low grade fever but has already been treated with abx. She wants to know if she should see SN. She states that she has only seen GI in the past for colonoscopy. Please advise, thanks!

## 2011-01-12 ENCOUNTER — Other Ambulatory Visit: Payer: Medicare Other

## 2011-01-12 DIAGNOSIS — R05 Cough: Secondary | ICD-10-CM

## 2011-01-12 DIAGNOSIS — R059 Cough, unspecified: Secondary | ICD-10-CM

## 2011-01-15 LAB — RESPIRATORY CULTURE OR RESPIRATORY AND SPUTUM CULTURE
Gram Stain: NONE SEEN
Organism ID, Bacteria: NORMAL

## 2011-01-19 ENCOUNTER — Encounter: Payer: Self-pay | Admitting: *Deleted

## 2011-01-20 ENCOUNTER — Ambulatory Visit (INDEPENDENT_AMBULATORY_CARE_PROVIDER_SITE_OTHER): Payer: Medicare Other | Admitting: Gastroenterology

## 2011-01-20 ENCOUNTER — Other Ambulatory Visit: Payer: Self-pay | Admitting: *Deleted

## 2011-01-20 ENCOUNTER — Encounter: Payer: Self-pay | Admitting: Gastroenterology

## 2011-01-20 VITALS — BP 108/60 | HR 92 | Ht 65.0 in | Wt 134.6 lb

## 2011-01-20 DIAGNOSIS — N83209 Unspecified ovarian cyst, unspecified side: Secondary | ICD-10-CM

## 2011-01-20 DIAGNOSIS — N83202 Unspecified ovarian cyst, left side: Secondary | ICD-10-CM | POA: Insufficient documentation

## 2011-01-20 DIAGNOSIS — R1030 Lower abdominal pain, unspecified: Secondary | ICD-10-CM

## 2011-01-20 DIAGNOSIS — K5730 Diverticulosis of large intestine without perforation or abscess without bleeding: Secondary | ICD-10-CM

## 2011-01-20 DIAGNOSIS — R109 Unspecified abdominal pain: Secondary | ICD-10-CM

## 2011-01-20 MED ORDER — TRAMADOL HCL 50 MG PO TABS: 50.0000 mg | ORAL_TABLET | Freq: Four times a day (QID) | ORAL | Status: DC | PRN

## 2011-01-20 NOTE — Progress Notes (Signed)
History of Present Illness:  This is a 77 year old Caucasian female with chronic bronchiectasis managed  by Dr. Lorin Picket Nadel/ pulmonary medicine. She has been doing extremely well from a  pulmonary standpoint, but has had 2 episodes of diverticulitis; in February and late October of this year. Both of these episodes have caused rather severe lower abdominal discomfort, but responsive to 10-14 day courses of ciprofloxacin. The patient does have mild diverticulosis confirmed by colonoscopy in May of 2011. Surprisingly, the patient has regular bowel movements without constipation, diarrhea, melena or hematochezia. She follows generally a low fiber diet as recommended by her primary care physicians. Currently, she denies abdominal pain, upper GI or hepatobiliary complaints. She is on aspirin 81 mg a day, when necessary Tylenol, and Naprosyn 440 mg a day for degenerative arthritis. CT scan was reviewed from February which showed mild diverticulitis with thickening of the sigmoid colon and pericolonic stranding. Also noted was a 2.5 cm left ovarian cyst. She is followed closely by gynecology, and apparently this cyst has resolved. Appetite is good, her weight is stable, and she denies any specific food intolerances. She denies acid reflux, dysphagia, hepatobiliary complaints, or systemic complaints at this time. She does have some continued bloating and distention since her most recent attack of abdominal pain.  I have reviewed this patient's present history, medical and surgical past history, allergies and medications.     ROS: The remainder of the 10 point ROS is negative     Physical Exam: General well developed well nourished patient in no acute distress, appearing her stated age Heart no significant murmurs, gallops or rubs noted Abdomen no hepatosplenomegaly masses or tenderness, BS normal. . Extremities no acute joint lesions, edema, phlebitis or evidence of cellulitis. Neurologic patient oriented x  3, Psychological mental status normal and normal affect.  Assessment and plan: Two episodes of mild diverticulitis over the last year. This may be related to Naprosyn, and I have discontinued this medication, and we'll prescribe when necessary tramadol 50 mg. I've advised her to continue with a high fiber diet, daily Benefiber, and liberal by mouth fluids. She is to call if she has recurrence of her problem. I do not think she needs colonoscopy or repeat CT scan at this point. I advised her to be sure that her gynecologic status is stable. If she does well, I will see her back in 3 months time for followup. Patient and her husband saw our patient education video on diverticular disease and its management. Otherwise she is to continue her medications as listed and reviewed in her record.  Encounter Diagnoses  Name Primary?  . Diverticular disease of colon Yes  . Abdominal pain, lower   . Left ovarian cyst

## 2011-01-20 NOTE — Progress Notes (Signed)
Left message on machine to stop naproxen and start tramadol as needed, rx sent

## 2011-01-20 NOTE — Patient Instructions (Addendum)
Today you watched the Diverticulitis movie.  Follow the high fiber diet and take Benefiber once a day. Make an appt to come back and see Dr Jarold Motto in 3 months.    Diverticulitis A diverticulum is a small pouch or sac on the colon. Diverticulosis is the presence of these diverticula on the colon. Diverticulitis is the irritation (inflammation) or infection of diverticula. CAUSES  The colon and its diverticula contain bacteria. If food particles block the tiny opening to a diverticulum, the bacteria inside can grow and cause an increase in pressure. This leads to infection and inflammation and is called diverticulitis. SYMPTOMS   Abdominal pain and tenderness. Usually, the pain is located on the left side of your abdomen. However, it could be located elsewhere.   Fever.   Bloating.   Feeling sick to your stomach (nausea).   Throwing up (vomiting).   Abnormal stools.  DIAGNOSIS  Your caregiver will take a history and perform a physical exam. Since many things can cause abdominal pain, other tests may be necessary. Tests may include:  Blood tests.   Urine tests.   X-ray of the abdomen.   CT scan of the abdomen.  Sometimes, surgery is needed to determine if diverticulitis or other conditions are causing your symptoms. TREATMENT  Most of the time, you can be treated without surgery. Treatment includes:  Resting the bowels by only having liquids for a few days. As you improve, you will need to eat a low-fiber diet.   Intravenous (IV) fluids if you are losing body fluids (dehydrated).   Antibiotic medicines that treat infections may be given.   Pain and nausea medicine, if needed.   Surgery if the inflamed diverticulum has burst.  HOME CARE INSTRUCTIONS   Try a clear liquid diet (broth, tea, or water for as long as directed by your caregiver). You may then gradually begin a low-fiber diet as tolerated. A low-fiber diet is a diet with less than 10 grams of fiber. Choose the  foods below to reduce fiber in the diet:   White breads, cereals, rice, and pasta.   Cooked fruits and vegetables or soft fresh fruits and vegetables without the skin.   Ground or well-cooked tender beef, ham, veal, lamb, pork, or poultry.   Eggs and seafood.   After your diverticulitis symptoms have improved, your caregiver may put you on a high-fiber diet. A high-fiber diet includes 14 grams of fiber for every 1000 calories consumed. For a standard 2000 calorie diet, you would need 28 grams of fiber. Follow these diet guidelines to help you increase the fiber in your diet. It is important to slowly increase the amount fiber in your diet to avoid gas, constipation, and bloating.   Choose whole-grain breads, cereals, pasta, and brown rice.   Choose fresh fruits and vegetables with the skin on. Do not overcook vegetables because the more vegetables are cooked, the more fiber is lost.   Choose more nuts, seeds, legumes, dried peas, beans, and lentils.   Look for food products that have greater than 3 grams of fiber per serving on the Nutrition Facts label.   Take all medicine as directed by your caregiver.   If your caregiver has given you a follow-up appointment, it is very important that you go. Not going could result in lasting (chronic) or permanent injury, pain, and disability. If there is any problem keeping the appointment, call to reschedule.  SEEK MEDICAL CARE IF:   Your pain does not improve.  You have a hard time advancing your diet beyond clear liquids.   Your bowel movements do not return to normal.  SEEK IMMEDIATE MEDICAL CARE IF:   Your pain becomes worse.   You have an oral temperature above 102 F (38.9 C), not controlled by medicine.   You have repeated vomiting.   You have bloody or black, tarry stools.   Symptoms that brought you to your caregiver become worse or are not getting better.  MAKE SURE YOU:   Understand these instructions.   Will watch your  condition.   Will get help right away if you are not doing well or get worse.  Document Released: 11/19/2004 Document Revised: 10/22/2010 Document Reviewed: 03/17/2010 [facility] Patient Information 2012 [place], [place].   High Fiber Diet A high fiber diet changes your normal diet to include more whole grains, legumes, fruits, and vegetables. Changes in the diet involve replacing refined carbohydrates with unrefined foods. The calorie level of the diet is essentially unchanged. The Dietary Reference Intake (recommended amount) for adult males is 38 g per day. For adult females, it is 25 g per day. Pregnant and lactating women should consume 28 g of fiber per day. Fiber is the intact part of a plant that is not broken down during digestion. Functional fiber is fiber that has been isolated from the plant to provide a beneficial effect in the body. PURPOSE  Increase stool bulk.   Ease and regulate bowel movements.   Lower cholesterol.  INDICATIONS THAT YOU NEED MORE FIBER  Constipation and hemorrhoids.   Uncomplicated diverticulosis (intestine condition) and irritable bowel syndrome.   Weight management.   As a protective measure against hardening of the arteries (atherosclerosis), diabetes, and cancer.  NOTE OF CAUTION If you have a digestive or bowel problem, ask your caregiver for advice before adding high fiber foods to your diet. Some of the following medical problems are such that a high fiber diet should not be used without consulting your caregiver:  Acute diverticulitis (intestine infection).   Partial small bowel obstructions.   Complicated diverticular disease involving bleeding, rupture (perforation), or abscess (boil, furuncle).   Presence of autonomic neuropathy (nerve damage) or gastric paresis (stomach cannot empty itself).  GUIDELINES FOR INCREASING FIBER  Start adding fiber to the diet slowly. A gradual increase of about 5 more grams (2 slices of whole-wheat bread,  2 servings of most fruits or vegetables, or 1 bowl of high fiber cereal) per day is best. Too rapid an increase in fiber may result in constipation, flatulence, and bloating.   Drink enough water and fluids to keep your urine clear or pale yellow. Water, juice, or caffeine-free drinks are recommended. Not drinking enough fluid may cause constipation.   Eat a variety of high fiber foods rather than one type of fiber.   Try to increase your intake of fiber through using high fiber foods rather than fiber pills or supplements that contain small amounts of fiber.   The goal is to change the types of food eaten. Do not supplement your present diet with high fiber foods, but replace foods in your present diet.  INCLUDE A VARIETY OF FIBER SOURCES  Replace refined and processed grains with whole grains, canned fruits with fresh fruits, and incorporate other fiber sources. White rice, white breads, and most bakery goods contain little or no fiber.   Brown whole-grain rice, buckwheat oats, and many fruits and vegetables are all good sources of fiber. These include: broccoli, Brussels sprouts, cabbage,  cauliflower, beets, sweet potatoes, white potatoes (skin on), carrots, tomatoes, eggplant, squash, berries, fresh fruits, and dried fruits.   Cereals appear to be the richest source of fiber. Cereal fiber is found in whole grains and bran. Bran is the fiber-rich outer coat of cereal grain, which is largely removed in refining. In whole-grain cereals, the bran remains. In breakfast cereals, the largest amount of fiber is found in those with "bran" in their names. The fiber content is sometimes indicated on the label.   You may need to include additional fruits and vegetables each day.   In baking, for 1 cup white flour, you may use the following substitutions:   1 cup whole-wheat flour minus 2 tbs.    cup white flour plus  cup whole-wheat flour.  Document Released: 02/09/2005 Document Revised: 10/22/2010  Document Reviewed: 12/18/2008 [facility] Patient Information 2012 [place], [place].

## 2011-01-21 ENCOUNTER — Telehealth: Payer: Self-pay | Admitting: Gastroenterology

## 2011-01-21 NOTE — Telephone Encounter (Signed)
Answered questions advised to take ultram as needed.

## 2011-03-05 LAB — AFB CULTURE WITH SMEAR (NOT AT ARMC): Acid Fast Smear: NONE SEEN

## 2011-04-23 ENCOUNTER — Encounter: Payer: Self-pay | Admitting: Pulmonary Disease

## 2011-05-01 ENCOUNTER — Telehealth: Payer: Self-pay | Admitting: Pulmonary Disease

## 2011-05-01 DIAGNOSIS — D649 Anemia, unspecified: Secondary | ICD-10-CM

## 2011-05-01 DIAGNOSIS — E785 Hyperlipidemia, unspecified: Secondary | ICD-10-CM

## 2011-05-01 DIAGNOSIS — M899 Disorder of bone, unspecified: Secondary | ICD-10-CM

## 2011-05-01 DIAGNOSIS — F419 Anxiety disorder, unspecified: Secondary | ICD-10-CM

## 2011-05-01 DIAGNOSIS — R1030 Lower abdominal pain, unspecified: Secondary | ICD-10-CM

## 2011-05-01 NOTE — Telephone Encounter (Signed)
Pt has appt with Dr Kriste Basque on 05-11-11 for 6 mth rov with fasting labs.  Pt wants labs drawn early.  Please advise on what to order.

## 2011-05-01 NOTE — Telephone Encounter (Signed)
Labs are in the computer for the pt. 

## 2011-05-05 ENCOUNTER — Other Ambulatory Visit (INDEPENDENT_AMBULATORY_CARE_PROVIDER_SITE_OTHER): Payer: Medicare Other

## 2011-05-05 DIAGNOSIS — D649 Anemia, unspecified: Secondary | ICD-10-CM

## 2011-05-05 DIAGNOSIS — F411 Generalized anxiety disorder: Secondary | ICD-10-CM

## 2011-05-05 DIAGNOSIS — E785 Hyperlipidemia, unspecified: Secondary | ICD-10-CM

## 2011-05-05 DIAGNOSIS — M949 Disorder of cartilage, unspecified: Secondary | ICD-10-CM

## 2011-05-05 DIAGNOSIS — M899 Disorder of bone, unspecified: Secondary | ICD-10-CM

## 2011-05-05 DIAGNOSIS — F419 Anxiety disorder, unspecified: Secondary | ICD-10-CM

## 2011-05-05 LAB — LIPID PANEL
Cholesterol: 176 mg/dL (ref 0–200)
HDL: 64.1 mg/dL (ref >39.00)
LDL Cholesterol: 98 mg/dL (ref 0–99)
Total CHOL/HDL Ratio: 3
Triglycerides: 69 mg/dL (ref 0.0–149.0)
VLDL: 13.8 mg/dL (ref 0.0–40.0)

## 2011-05-05 LAB — CBC WITH DIFFERENTIAL/PLATELET
Basophils Absolute: 0.1 10*3/uL (ref 0.0–0.1)
Basophils Relative: 1.2 % (ref 0.0–3.0)
Eosinophils Absolute: 0.2 10*3/uL (ref 0.0–0.7)
Eosinophils Relative: 4.7 % (ref 0.0–5.0)
HCT: 39.3 % (ref 36.0–46.0)
Hemoglobin: 13.4 g/dL (ref 12.0–15.0)
Lymphocytes Relative: 23.6 % (ref 12.0–46.0)
Lymphs Abs: 1.2 10*3/uL (ref 0.7–4.0)
MCHC: 34.2 g/dL (ref 30.0–36.0)
MCV: 95.5 fl (ref 78.0–100.0)
Monocytes Absolute: 0.5 10*3/uL (ref 0.1–1.0)
Monocytes Relative: 9.3 % (ref 3.0–12.0)
Neutro Abs: 3.1 10*3/uL (ref 1.4–7.7)
Neutrophils Relative %: 61.2 % (ref 43.0–77.0)
Platelets: 227 10*3/uL (ref 150.0–400.0)
RBC: 4.12 Mil/uL (ref 3.87–5.11)
RDW: 12.8 % (ref 11.5–14.6)
WBC: 5 10*3/uL (ref 4.5–10.5)

## 2011-05-05 LAB — HEPATIC FUNCTION PANEL
ALT: 19 U/L (ref 0–35)
AST: 26 U/L (ref 0–37)
Albumin: 3.8 g/dL (ref 3.5–5.2)
Alkaline Phosphatase: 64 U/L (ref 39–117)
Bilirubin, Direct: 0.1 mg/dL (ref 0.0–0.3)
Total Bilirubin: 0.6 mg/dL (ref 0.3–1.2)
Total Protein: 7.4 g/dL (ref 6.0–8.3)

## 2011-05-05 LAB — BASIC METABOLIC PANEL
BUN: 13 mg/dL (ref 6–23)
CO2: 30 mEq/L (ref 19–32)
Calcium: 9.2 mg/dL (ref 8.4–10.5)
Chloride: 103 mEq/L (ref 96–112)
Creatinine, Ser: 0.6 mg/dL (ref 0.4–1.2)
GFR: 103.32 mL/min (ref >60.00)
Glucose, Bld: 90 mg/dL (ref 70–99)
Potassium: 4.4 mEq/L (ref 3.5–5.1)
Sodium: 138 mEq/L (ref 135–145)

## 2011-05-05 LAB — TSH: TSH: 3.63 u[IU]/mL (ref 0.35–5.50)

## 2011-05-06 LAB — VITAMIN D 25 HYDROXY (VIT D DEFICIENCY, FRACTURES): Vit D, 25-Hydroxy: 35 ng/mL (ref 30–89)

## 2011-05-11 ENCOUNTER — Ambulatory Visit (INDEPENDENT_AMBULATORY_CARE_PROVIDER_SITE_OTHER)
Admission: RE | Admit: 2011-05-11 | Discharge: 2011-05-11 | Disposition: A | Discharge status: Home / Self Care | Payer: Medicare Other | Source: Ambulatory Visit | Attending: Pulmonary Disease | Admitting: Pulmonary Disease

## 2011-05-11 ENCOUNTER — Encounter: Payer: Self-pay | Admitting: Pulmonary Disease

## 2011-05-11 ENCOUNTER — Ambulatory Visit (INDEPENDENT_AMBULATORY_CARE_PROVIDER_SITE_OTHER): Payer: Medicare Other | Admitting: Pulmonary Disease

## 2011-05-11 VITALS — BP 142/80 | HR 84 | Temp 97.4°F | Ht 64.0 in | Wt 139.2 lb

## 2011-05-11 DIAGNOSIS — J479 Bronchiectasis, uncomplicated: Secondary | ICD-10-CM

## 2011-05-11 DIAGNOSIS — K5730 Diverticulosis of large intestine without perforation or abscess without bleeding: Secondary | ICD-10-CM

## 2011-05-11 DIAGNOSIS — E785 Hyperlipidemia, unspecified: Secondary | ICD-10-CM

## 2011-05-11 DIAGNOSIS — M899 Disorder of bone, unspecified: Secondary | ICD-10-CM

## 2011-05-11 DIAGNOSIS — M1990 Unspecified osteoarthritis, unspecified site: Secondary | ICD-10-CM

## 2011-05-11 DIAGNOSIS — C50919 Malignant neoplasm of unspecified site of unspecified female breast: Secondary | ICD-10-CM

## 2011-05-11 DIAGNOSIS — M949 Disorder of cartilage, unspecified: Secondary | ICD-10-CM

## 2011-05-11 NOTE — Progress Notes (Addendum)
Subjective:    Patient ID: Miranda Thompson, female    DOB: [birth year], 78 y.o.   MRN: [id number]  HPI 78 y/o WF here for a follow up visit... she has mult med problems as noted below...   ~  [name]:  she notes self limited episodes of hemoptysis ~1/mo... she continues to do her postural drainage, Advair, Mucinex, Fluids, etc & is very productive w/ copious thick green sputum regularly... she continues to exercise regularly (walks 46mi/d) & takes numerous herbs etc- the latest is Tumeric "it's good for arthritis" per the [facility]...  last sputum cultures= 8/10- NTF, & neg AFB...  she is under some stress w/ neice recently Dx w/ cancer... CXR today>> diffuse changes, scarring, bronchiectasis, stable-NAD...  ~  Sep11:  29mo ROV- recent upper resp infection w/ incr cough, green gray phlegm, no blood, temp to 100, no CP/ ch in SOB- she toughed it out & is now improved w/o intercurrent antibiotic Rx... she does note incr fatigue & recent labs showed UTI> we will Rx w/ Cipro for this & bronchiectasis...  she notes that her ortho symptoms are improved w/ the Tumeric & Glucosamine, & prev dermatitis resolved w/ "Black Current"... advised to remain on the Calcium, MVI, Vit D daily... she had colonoscopy 5/11 +divertics only.  ~  May 05, 2010:  29mo ROV & presents w/ a "rough 3-4 weeks"> abd pain LLQ & across lower abd, no ch in bowels & no blood seen, went to see GYN DrRomaine & Sonar showed left colon swollen, given Cipro 2/22-29 but still febrile, still in pain so DrRomaine gave her another round of Cipro 3/7==> ;  exam shows LLQ tender w/o guarding or rebound & all c/w sigm diverticulitis >> suggested CT Abd & adding FLAGYL + low residue diet to the regimen, ?f/u w/ GI pending CT results.    She has mod severe underlying bronchiectasis & granulomatous lung dis> stable on her regimen of Advair, Mucinex, chestPT/ post drainage;  due for her yearly f/u CXR today = chr pulm scarring, NAD;  Labs showed Chems OK  x Na 131, CBC OK w/ Hg 12.8, WBC 7.2, Sed 83...     She had an interval BMD 10/11 at [facility] showing TScores +0.3 in Spine, -0.8 in left FemNeck, & -2.1 right forearm (improved in spine but worse in hip & forearm);  treated by DrRomaine on Calcium, MVI, Vit D 1000, and numerous herbs...  ~  November 10, 2010:  29mo ROV & she continues stable overall; she had to cancel her planned trip to [place] when her niece passed away w/ melanoma;  Prev bout of diverticulitis resolved w/ cipro & Flagyl and back to baseline;  She continues her daily postural drain w/ copious sputum production (no recent cultures) & he same bronchodilator regimen; no new complaints or concerns...  ~  May 11, 2011:  29mo ROV & Miranda Thompson notes some incr fatigue, dry cough, right sided chest discomfort, & pulse in the 90's;  She takes her Advair250, GFN, & does her chest treatments regularly; she also takes a number of herbs that she believes are beneficial;  She had f/u OV w/ DrPatterson 11/12 after another bout of diverticulitis in Oct... See prob list below>> CXR 3/13 showed essentially no change> normal heart size, prom pulm arts, bilat interstitial prominence & nodularity, bronchiectasis, NAD, surg clips from prev left breast ca surg. LABS 3/13:  FLP- at goals on Diet;  Chems- wnl;  CBC- wnl;  TSH=3.63;  VitD=35.  Problem List:    << PROBLEM LIST UPDATED 05/11/11 >>  ALLERGIC RHINITIS (ICD-477.9) - she uses OTC antihistamines Prn.  BRONCHIECTASIS (ICD-494.0) & Hx of PNEUMONIA (ICD-486) - on ADVAIR 250 Bid, & MUCINEX 1-2Bid...  Long hx severe bronchiectasis, granulomatous lung dis, and pneumonia (initially Dx 1968 by DrFrazier, subseq bx= noncaseating gran dz & chr inflam)... her last bronchoscopy was 4/04 revealing some mucus plugging... CTChest 4/04 showed marked bronchiectasis in lingular, LLL, RML, and mucus plugging... CT repeated 6/06- similar. ~  Sputum cultures 5/08 grew Serratia (R to amox/ceph, S to all others), branching  part acid fast bacilli resembling nocardia, yeast c/w candida... ~  CXR 1/09 chr lung dis, no change from prev w/ left mid lung zone as the worst area... ~  CXR 3/10 w/ chr lung changes and NAD suspected... ~  Sputum 8/10 showed NTF only & AFB smears & cult all neg... ~  CXR 3/11 showed chr lung dis, no acute changes, stable... ~  CXR 3/12 showed chronic changes, scarring, NAD.Marland Kitchen. ~  CXR 3/13 showed essentially no change> normal heart size, prom pulm arts, bilat interstitial prominence & nodularity, bronchiectasis, NAD, surg clips from prev left breast ca surg.  HYPERCHOLESTEROLEMIA, BORDERLINE (ICD-272.4) - on diet alone... ~  FLP 9/10 showed TChol 174, TG 64, HDL 49, LDL 112 ~  FLP 9/11 showed TChol 175, TG 61, HDL 48, LDL 115 ~  FLP 3/13 showed TChol 176, TG 69, HDL 64, LDL 98  Hx of ADENOCARCINOMA, BREAST (ICD-174.9) - S/P surgery 5/94 [facility] w/ wide excision L breast lesion and LN dissection (stage II 1.0  cm tumor), w/ post op XRT and chemotherapy... she was followed by [facility] for oncology... then DrLivesay (released in 2003). ~  routine Mammogram 2/10 was neg- f/u planned 69yr. ~  f/u mammogram 2/11 = neg, NAD, f/u planned 38yr. ~  f/u mammogram 2/12 = neg, NAD, f/u 52yr. ~  f/u mammogram 2/13 = neg, NAD...  DIVERTICULOSIS OF COLON (ICD-562.10) - colonoscopy 3/05 by DrPatterson showed divertics only... she has +fam hx colon ca w/ colon check q79yrs... f/u colonoscopy 5/11 showed +divertics, no polyps... ~  3/12:  Hx, exam & CTAbd c/w diverticulitis; treated w/ Cipro/ Flagyl & resolved;  Advised Metamucil, Miralax, etc. ~  11/12:  She had f/u DrPatterson after another bout of diverticulitis; he notes regular BMs, good appetite, stable weight; he wanted her to STOP Naprosyn, ok to use Tylenol/ Tramadol, rec hi fiber, saw the Divetic movie.  DEGENERATIVE JOINT DISEASE (ICD-715.90) - see Ortho eval 2010 by DrDaldorf... she takes Glucosamine & Herbal supplement- milk thistle/ tumeric which  she says really helps...  LOW BACK PAIN SYNDROME (ICD-724.2) - LBP and eval by [facility] 2010 w/ MRI showed herniated disc L3-4 w/ mod spinal stenosis and lat recess stenosis bilat...   ROTATOR CUFF SYNDROME, RIGHT (ICD-726.10) - had injection by [facility] 10/07 w/ improvement.  OSTEOPENIA (ICD-733.90) - last BMD at [facility] 10/06 showed norm spine, norm hip, osteopenic forearm (-2.0)... followed by GYN = DrRomaine who is treating a low Vit D level... she takes calcium, Vitamins, & Vit D 1000u daily from [place]... ~  labs 9/10 on 50K weekly showed Vit D level = 42... rec> change to 1000 u daily. ~  labs 9/11 on 1000 u daily showed Vit D level = 34... rec> continue daily supplement. ~  Labs 3/13 showed Vit D level = 35... Continue supplements.  Hx of ANEMIA (ICD-285.9) -  resolved... ~  labs 9/10 showed Hg= 13.6 ~  labs 9/11 showed Hg= 13.2 ~  Labs 3/13 showed Hg= 13.4  ECZEMA - she has hx of eczema on her hands and has used CoQ10 and Borage Oil as rec by an Dentist in [place]... recently changed to [facility] Current instead of these other supplements & rash is much improved...   Past Surgical History  Procedure Date  . Lung biopsy 1968    TSurg---Dr. Bascom Levels  . Left breast lumpectomy and ln dissection 06/1992    Dr. Ezzard Standing    Outpatient Encounter Prescriptions as of 05/11/2011  Medication Sig Dispense Refill  . acetaminophen (TYLENOL) 325 MG tablet Take 650 mg by mouth every 6 (six) hours as needed.        Marland Kitchen ADVAIR DISKUS 250-50 MCG/DOSE AEPB TAKE 1 PUFF TWICE A DAY  1 each  5  . aspirin 81 MG tablet Take 81 mg by mouth daily.        Marland Kitchen BLACK CURRANT SEED OIL PO Take 1 tablet by mouth daily.        . Calcium Citrate-Vitamin D (CITRACAL MAXIMUM) 315-250 MG-UNIT TABS Take 1 tablet by mouth daily.        Marland Kitchen estradiol (VAGIFEM) 25 MCG vaginal tablet Use 2 times per week       . glucosamine-chondroitin 500-400 MG tablet Take 1 tablet by mouth daily.        . Misc Natural Products  (SILYMARIN) 50 MG CAPS Take 2 capsules by mouth daily.        . Multiple Vitamins-Minerals (MULTIVITAMIN & MINERAL PO) Take 1 tablet by mouth daily.        . Pseudoephedrine-Guaifenesin (MUCINEX D) [phone] MG TB12 Take 1 tablet by mouth daily.        . traMADol (ULTRAM) 50 MG tablet Take 1 tablet (50 mg total) by mouth every 6 (six) hours as needed for pain. Maximum dose= 8 tablets per day  90 tablet  6    No Known Allergies   Current Medications, Allergies, Past Medical History, Past Surgical History, Family History, and Social History were reviewed in [employer] record.     Review of Systems         See HPI - all other systems neg except as noted...  The patient complains of dyspnea on exertion and abdominal pain.  The patient denies anorexia, fever, weight loss, weight gain, vision loss, decreased hearing, hoarseness, chest pain, syncope, peripheral edema, prolonged cough, headaches, hemoptysis, melena, hematochezia, severe indigestion/heartburn, hematuria, incontinence, muscle weakness, suspicious skin lesions, transient blindness, difficulty walking, depression, unusual weight change, abnormal bleeding, enlarged lymph nodes, and angioedema.     Objective:   Physical Exam     WD, WN, 78 y/o WF in NAD... GENERAL:  Alert & oriented; pleasant & cooperative... HEENT:  [place]/AT, EOM-wnl, PERRLA, EACs-clear, TMs-wnl, NOSE-clear, THROAT-clear & wnl. NECK:  Supple w/ fairROM; no JVD; normal carotid impulses w/o bruits; no thyromegaly or nodules palpated; no lymphadenopathy. CHEST:  scat bilat rhonchi without wheezing, rales or signs of consolidation... HEART:  Regular Rhythm; without murmurs/ rubs/ or gallops heard... ABDOMEN:  Soft w/ mild tender LLQ; normal bowel sounds; no organomegaly or masses detected. EXT: without deformities or arthritic changes; no varicose veins/ venous insuffic/ or edema. NEURO:  CN's intact;  no focal neuro deficits... DERM:  No lesions  noted; no rash etc...  RADIOLOGY DATA:  Reviewed in the EPIC EMR & discussed w/ the patient...  LABORATORY DATA:  Reviewed in the EPIC EMR & discussed w/ the  patient...    Assessment & Plan:   COPD/ Bronchiectasis/ Hx pneumonia>  On Advair250, Mucinex2Bid, Fluids & vigorous Chest PT/ postural drainage; given sputum specimen cups for sterile collection for C&S if sputum changes or the need arises...  CHOL>  On diet alone & f/u FLP is at goal...  Breast Cancer>  S/p surg by [facility] in [facility] & oncology f/u by DrNeijstrom, then [facility] 2003 & released; yearly mammograms & monthly self exams were wnl...  Divertics>  She had acute diverticulitis 3/12 & 10/12> resolved w/ diet adjust & Cipro/Flagyl, now back to baseline & advised re- Metamucil, Fiberall, etc; she saw DrPatterson 11/12.  DJD/ LBP>  Ortho is DrDaldorf & she takes Glucosamine & Herbs and doing satis she says...  Osteopenia/ Vit D defic>  On Calcium, MVI, Vit D supplement; followed by GYN= DrRomaine...  Other medical issues as noted...    Patient's Medications  New Prescriptions   No medications on file  Previous Medications   ACETAMINOPHEN (TYLENOL) 325 MG TABLET    Take 650 mg by mouth every 6 (six) hours as needed.     ADVAIR DISKUS 250-50 MCG/DOSE AEPB    TAKE 1 PUFF TWICE A DAY   ASPIRIN 81 MG TABLET    Take 81 mg by mouth daily.     BLACK CURRANT SEED OIL PO    Take 1 tablet by mouth daily.     CALCIUM CITRATE-VITAMIN D (CITRACAL MAXIMUM) 315-250 MG-UNIT TABS    Take 1 tablet by mouth daily.     ESTRADIOL (VAGIFEM) 25 MCG VAGINAL TABLET    Use 2 times per week    GLUCOSAMINE-CHONDROITIN 500-400 MG TABLET    Take 1 tablet by mouth daily.     MISC NATURAL PRODUCTS (SILYMARIN) 50 MG CAPS    Take 2 capsules by mouth daily.     MULTIPLE VITAMINS-MINERALS (MULTIVITAMIN & MINERAL PO)    Take 1 tablet by mouth daily.     PSEUDOEPHEDRINE-GUAIFENESIN (MUCINEX D) [phone] MG TB12    Take 1 tablet by mouth daily.     TRAMADOL  (ULTRAM) 50 MG TABLET    Take 1 tablet (50 mg total) by mouth every 6 (six) hours as needed for pain. Maximum dose= 8 tablets per day  Modified Medications   No medications on file  Discontinued Medications   No medications on file

## 2011-05-11 NOTE — Patient Instructions (Signed)
Today we updated your med list in our EPIC system...    Continue your current medications the same...  Keep up the great job of chest physiotherapy...  Call for any questions...  Let's continue our 6 month follow up visits & call anytime if needed for problems.Marland KitchenMarland Kitchen

## 2011-06-24 ENCOUNTER — Ambulatory Visit
Admission: RE | Admit: 2011-06-24 | Discharge: 2011-06-24 | Disposition: A | Discharge status: Home / Self Care | Payer: Medicare Other | Source: Ambulatory Visit | Attending: Sports Medicine | Admitting: Sports Medicine

## 2011-06-24 ENCOUNTER — Ambulatory Visit (INDEPENDENT_AMBULATORY_CARE_PROVIDER_SITE_OTHER): Payer: Medicare Other | Admitting: Sports Medicine

## 2011-06-24 VITALS — BP 138/78 | Ht 64.0 in

## 2011-06-24 DIAGNOSIS — M25559 Pain in unspecified hip: Secondary | ICD-10-CM

## 2011-06-24 DIAGNOSIS — M169 Osteoarthritis of hip, unspecified: Secondary | ICD-10-CM

## 2011-06-24 DIAGNOSIS — M25569 Pain in unspecified knee: Secondary | ICD-10-CM

## 2011-06-24 DIAGNOSIS — M25561 Pain in right knee: Secondary | ICD-10-CM

## 2011-06-24 DIAGNOSIS — M1612 Unilateral primary osteoarthritis, left hip: Secondary | ICD-10-CM

## 2011-06-24 DIAGNOSIS — M2170 Unequal limb length (acquired), unspecified site: Secondary | ICD-10-CM

## 2011-06-24 MED ORDER — TRAMADOL HCL 50 MG PO TABS: 50.0000 mg | ORAL_TABLET | Freq: Four times a day (QID) | ORAL | Status: DC | PRN

## 2011-06-24 NOTE — Progress Notes (Signed)
  Subjective:    Patient ID: Miranda Thompson, female    DOB: [birth year], 78 y.o.   MRN: [id number]  HPI  Pt presents to clinic for lt hip pain, and bilat knee pain. Lt knee pain started 40 yrs ago, tx with indocin and 10-12 ASA per day per Dr. Candie Echevaria who dx with RA in lt knee.  Lt knee stiff after sitting, but feeling better lately. Lying on lt side and crossing legs painful.  Lt hip stiff She says that people always think she is limping even when it is not hurting Pain down outside after walking  Walks 4 miles most days   Review of Systems     Objective:   Physical Exam  NAD  Bilat hand exam: 2-3 rd MCP synovitis  1st MCP subluxed  Nodules on DIP joints bilat   Knee exam: Seated spurring medial joint lines bilat Genu varus bilat weakness of VMO bilat Full extension bilat   Hip exam: IR 20 deg, ER 40 deg on rt IR 10 deg, ER 30 deg on lt  Lying position rt ROM unchanged Lt lying position IR 10 deg, but slightly improved Full hip flexion on rt Lacks 10-15 deg on lt Hip abduction good bilat  Lt trendelenburg to lt- 1 inch Left leg 2cm shorter than rt  Xray of Pelvis LT hip has moderated DJD but RT is mild         Assessment & Plan:

## 2011-06-24 NOTE — Patient Instructions (Signed)
Please take tramadol every 6 hours as needed for arthritis pain.  Try body helix knee sleeve, and the lift in your shoes  Please get hip and knee x-rays  Please follow up in 4 weeks  Thank you for seeing [place] today!

## 2011-06-25 ENCOUNTER — Other Ambulatory Visit: Payer: Self-pay | Admitting: *Deleted

## 2011-06-25 DIAGNOSIS — M2170 Unequal limb length (acquired), unspecified site: Secondary | ICD-10-CM | POA: Insufficient documentation

## 2011-06-25 DIAGNOSIS — M174 Other bilateral secondary osteoarthritis of knee: Secondary | ICD-10-CM | POA: Insufficient documentation

## 2011-06-25 DIAGNOSIS — M1612 Unilateral primary osteoarthritis, left hip: Secondary | ICD-10-CM | POA: Insufficient documentation

## 2011-06-25 DIAGNOSIS — M25561 Pain in right knee: Secondary | ICD-10-CM

## 2011-06-25 NOTE — Assessment & Plan Note (Signed)
We placed a 1.5 cm lift and she walked much better after that  Needs lifts in other shoes  Reck 1 month

## 2011-06-25 NOTE — Assessment & Plan Note (Signed)
Use glucosamine/ chondroitin  Add tramadol  correcct gait

## 2011-06-29 ENCOUNTER — Ambulatory Visit
Admission: RE | Admit: 2011-06-29 | Discharge: 2011-06-29 | Disposition: A | Discharge status: Home / Self Care | Payer: Medicare Other | Source: Ambulatory Visit | Attending: Sports Medicine | Admitting: Sports Medicine

## 2011-06-29 DIAGNOSIS — M25562 Pain in left knee: Secondary | ICD-10-CM

## 2011-06-29 DIAGNOSIS — M25561 Pain in right knee: Secondary | ICD-10-CM

## 2011-06-30 ENCOUNTER — Telehealth: Payer: Self-pay | Admitting: *Deleted

## 2011-06-30 NOTE — Telephone Encounter (Signed)
Called pt- gave her x-ray results for knees and hips.

## 2011-06-30 NOTE — Telephone Encounter (Signed)
Message copied by Mora Bellman on Tue Jun 30, 2011  2:04 PM ------      Message from: Enid Baas      Created: Mon Jun 29, 2011  8:17 PM       Let her know that the knee arthritis is pretty severe on inside.            I may need to add more support to shoes, etc depending on how she does with treatment we advised            We should check her back after a month or so - see if she is scheduled            tks

## 2011-07-29 ENCOUNTER — Ambulatory Visit: Payer: Medicare Other | Admitting: Sports Medicine

## 2011-08-04 ENCOUNTER — Ambulatory Visit (INDEPENDENT_AMBULATORY_CARE_PROVIDER_SITE_OTHER): Payer: Medicare Other | Admitting: Sports Medicine

## 2011-08-04 ENCOUNTER — Encounter: Payer: Self-pay | Admitting: Sports Medicine

## 2011-08-04 VITALS — BP 160/74 | HR 88 | Ht 64.0 in | Wt 139.0 lb

## 2011-08-04 DIAGNOSIS — M2170 Unequal limb length (acquired), unspecified site: Secondary | ICD-10-CM

## 2011-08-04 DIAGNOSIS — M169 Osteoarthritis of hip, unspecified: Secondary | ICD-10-CM

## 2011-08-04 DIAGNOSIS — M25561 Pain in right knee: Secondary | ICD-10-CM

## 2011-08-04 DIAGNOSIS — M1612 Unilateral primary osteoarthritis, left hip: Secondary | ICD-10-CM

## 2011-08-04 DIAGNOSIS — M25569 Pain in unspecified knee: Secondary | ICD-10-CM

## 2011-08-04 NOTE — Assessment & Plan Note (Signed)
Use felt pads to correct the leg length difference at all of her shoes  The should make a difference so that she can keep walking with less knee and hip pain

## 2011-08-04 NOTE — Progress Notes (Signed)
  Subjective:    Patient ID: Miranda Thompson, female    DOB: [birth year], 78 y.o.   MRN: [id number]  HPI Left hip pain: Much improved compared to last appointment- feels that heel lift has really helped her discomfort. Pain sometimes occurs in the morning or after prolonged sitting-  Occasionally takes aleve 4-5x  Per week.  No hip redness. No hip swelling.  No longer has limp when she walks when she wears the heel lift.   Tramadol made her too drowsy  Knee pain bilateral: Much improved compared to last appointment- feels that heel lift has really helped her discomfort in her knees as well. Pain sometimes occurs in the morning on getting up from bed or after prolonged sitting-  Occasionally takes aleve 4-5x  Per week.  No knee redness. No knee swelling. X-ray of knees obtained since last appt showed significant osteoarthritis bilateral in medial compartments.    Review of Systems as per above.    Objective:   Physical Exam  Musculoskeletal:       Knee exam: No redness. No swelling.  Normal ligament strength.  No tenderness on palpation.   + crepitus on rom - normal extension and flexion.   Left hip exam: Normal rom. No pain on palpation of hip joint.       Walking gait - patient still has a significant Trendelenburg to the left is walking with much less of a limp We placed a felt lift in her sandals She was able to walk with more comfort And less Trendelenburg with fat       Assessment & Plan:

## 2011-08-04 NOTE — Assessment & Plan Note (Signed)
Pain well controlled at this time.  Continue orthotic use to correct leg length difference.

## 2011-08-04 NOTE — Assessment & Plan Note (Addendum)
Continue using orthotics as prescribed at last appointment. Use NSAIDs only as needed- discussed the importance of limiting use. / Bilateral severe medial compartment arthritis is noted on x-ray  Keep up some baseline exercises for quadriceps strength  Continue using compression sleeves  She is able to walk about 4 miles a day and I do not think she needs to increase that level  Recheck in 4 months

## 2011-10-15 ENCOUNTER — Ambulatory Visit (INDEPENDENT_AMBULATORY_CARE_PROVIDER_SITE_OTHER): Payer: Medicare Other | Admitting: Sports Medicine

## 2011-10-15 ENCOUNTER — Encounter: Payer: Self-pay | Admitting: Sports Medicine

## 2011-10-15 VITALS — BP 159/75 | HR 87 | Ht 65.0 in | Wt 139.0 lb

## 2011-10-15 DIAGNOSIS — M1990 Unspecified osteoarthritis, unspecified site: Secondary | ICD-10-CM

## 2011-10-15 NOTE — Patient Instructions (Addendum)
1.  Hip exercises:  Continue to do hip flexion exercises, rotation (abduction), knee extension. 2.  Continue to wear good shoe support.

## 2011-10-15 NOTE — Progress Notes (Signed)
  Subjective:    Patient ID: Miranda Thompson, female    DOB: 10-13-1933, 78 y.o.   MRN: [id number]  HPI 55 yof with history of bilateral knee osteoarthritis and left hip osteoarthritis presents for follow up.  She has continued to do hip strengthening exercises at home.  She continues to have constant pain in the bilateral knees, mostly in the medial aspect, R>L.  Average severity 8/10.  Associated with stiffness.  Occasional locking of left knee.  She also continues to have left hip pain, constant, located over posterolateral aspect.  Patient is able to walk up to 4 miles.     Review of Systems No low back pain.  No LE numbness or weakness.      Objective:   Physical Exam Gen - alert and oriented, nad, well appearing, well nourished HEENT - ncat, mmm, eomi, nl conjunctiva Resp - respirations non-labored Psych - appropriate mood and affect MS- Varus change of bilateral knees with standing, bilateral medial hypertrophy. Bilateral knees with extension to -3 degrees, felxion 130 degrees.   Bakers cyst on right. Left hip flexion 130 degrees, IR 10 degrees, ER 40 degrees.  Mildly tender to palpation over lateral gluteal muscles.   Right hip IR 10 degrees, ER 40 degrees.    PROCEDURE NOTE FOR RIGHT KNEE CORTICOSTEROID INJECTION Consent obtained and verified. Patient placed in supine position.  Area cleaned with alcohol.   Topical analgesic spray: Ethyl chloride. Joint:  Right knee Approached in typical fashion with: pt in supine position with knee slightly flexed, superolateral approach. Completed without minor difficulty secondary to arthritic change preventing easy needle entry. Meds: 1cc 40mg  depo-medrol, 4cc 1% lidocaine Needle: 25g, 1.5" Aftercare instructions and Red flags advised.       Assessment & Plan:  46 yof with bilateral knee and hip osteoarthritis.  1.  Knee osteoarthritis. -  S/p knee injection today, to monitor symptom resolution. -  Body helix compression  sleeve. -  Continue activity.  2.  Hip osteoarthritis. -  Continue HEP for hip flexor and abductor strengthening.

## 2011-10-15 NOTE — Assessment & Plan Note (Signed)
Injection of RT knee today  If this works well consider left knee  Cont all other tx as before   compression does seem to help Hold activity for 72 hrs p injection

## 2011-11-02 ENCOUNTER — Encounter: Payer: Self-pay | Admitting: Pulmonary Disease

## 2011-11-02 ENCOUNTER — Ambulatory Visit (INDEPENDENT_AMBULATORY_CARE_PROVIDER_SITE_OTHER)
Admission: RE | Admit: 2011-11-02 | Discharge: 2011-11-02 | Disposition: A | Discharge status: Home / Self Care | Payer: Medicare Other | Source: Ambulatory Visit | Attending: Pulmonary Disease | Admitting: Pulmonary Disease

## 2011-11-02 ENCOUNTER — Ambulatory Visit (INDEPENDENT_AMBULATORY_CARE_PROVIDER_SITE_OTHER): Payer: Medicare Other | Admitting: Pulmonary Disease

## 2011-11-02 VITALS — BP 130/80 | HR 85 | Temp 97.8°F | Ht 64.0 in | Wt 136.6 lb

## 2011-11-02 DIAGNOSIS — M545 Low back pain, unspecified: Secondary | ICD-10-CM

## 2011-11-02 DIAGNOSIS — J479 Bronchiectasis, uncomplicated: Secondary | ICD-10-CM

## 2011-11-02 DIAGNOSIS — J471 Bronchiectasis with (acute) exacerbation: Secondary | ICD-10-CM

## 2011-11-02 DIAGNOSIS — E785 Hyperlipidemia, unspecified: Secondary | ICD-10-CM

## 2011-11-02 DIAGNOSIS — M899 Disorder of bone, unspecified: Secondary | ICD-10-CM

## 2011-11-02 DIAGNOSIS — C50919 Malignant neoplasm of unspecified site of unspecified female breast: Secondary | ICD-10-CM

## 2011-11-02 DIAGNOSIS — M25561 Pain in right knee: Secondary | ICD-10-CM

## 2011-11-02 DIAGNOSIS — M25562 Pain in left knee: Secondary | ICD-10-CM

## 2011-11-02 DIAGNOSIS — M1990 Unspecified osteoarthritis, unspecified site: Secondary | ICD-10-CM

## 2011-11-02 DIAGNOSIS — M25569 Pain in unspecified knee: Secondary | ICD-10-CM

## 2011-11-02 DIAGNOSIS — K5730 Diverticulosis of large intestine without perforation or abscess without bleeding: Secondary | ICD-10-CM

## 2011-11-02 MED ORDER — LEVOFLOXACIN 500 MG PO TABS: 500.0000 mg | ORAL_TABLET | Freq: Every day | ORAL | Status: AC

## 2011-11-02 NOTE — Patient Instructions (Addendum)
Today we updated your med list in our EPIC system...    Continue your current medications the same...  We decided to treat your Bronchiectasis exacerbation w/ LEVAQUIN daily for 10d...  Please return to our lab in about 2 weeks for your FASTING blood work...    We will call you w/ the results when avail...  We gave you a sterile cup to collect a deep sputum specimen during your next acute problem...    Call us to set up a sputum culture at that time...  Let's continue our regular check ups every 6 months & sooner if needed for any reason.Marland KitchenMarland Kitchen

## 2011-11-05 ENCOUNTER — Telehealth: Payer: Self-pay | Admitting: Pulmonary Disease

## 2011-11-05 ENCOUNTER — Other Ambulatory Visit: Payer: Medicare Other

## 2011-11-05 DIAGNOSIS — J479 Bronchiectasis, uncomplicated: Secondary | ICD-10-CM

## 2011-11-05 NOTE — Telephone Encounter (Signed)
Order placed for sputum culture.

## 2011-11-08 ENCOUNTER — Encounter: Payer: Self-pay | Admitting: Pulmonary Disease

## 2011-11-08 NOTE — Progress Notes (Addendum)
Subjective:    Patient ID: Miranda Thompson, female    DOB: 1933-05-20, 78 y.o.   MRN: [id number]  HPI 78 y/o WF here for a follow up visit... she has mult med problems as noted below...   ~  [name]:  she notes self limited episodes of hemoptysis ~1/mo... she continues to do her postural drainage, Advair, Mucinex, Fluids, etc & is very productive w/ copious thick green sputum regularly... she continues to exercise regularly (walks 75mi/d) & takes numerous herbs etc- the latest is Tumeric "it's good for arthritis" per the [facility]...  last sputum cultures= 8/10- NTF, & neg AFB...  she is under some stress w/ neice recently Dx w/ cancer... CXR today>> diffuse changes, scarring, bronchiectasis, stable-NAD...  ~  Sep11:  66mo ROV- recent upper resp infection w/ incr cough, green gray phlegm, no blood, temp to 100, no CP/ ch in SOB- she toughed it out & is now improved w/o intercurrent antibiotic Rx... she does note incr fatigue & recent labs showed UTI> we will Rx w/ Cipro for this & bronchiectasis...  she notes that her ortho symptoms are improved w/ the Tumeric & Glucosamine, & prev dermatitis resolved w/ "Black Current"... advised to remain on the Calcium, MVI, Vit D daily... she had colonoscopy 5/11 +divertics only.  ~  May 05, 2010:  66mo ROV & presents w/ a "rough 3-4 weeks"> abd pain LLQ & across lower abd, no ch in bowels & no blood seen, went to see GYN DrRomaine & Sonar showed left colon swollen, given Cipro 2/22-29 but still febrile, still in pain so DrRomaine gave her another round of Cipro 3/7==> ;  exam shows LLQ tender w/o guarding or rebound & all c/w sigm diverticulitis >> suggested CT Abd & adding FLAGYL + low residue diet to the regimen, ?f/u w/ GI pending CT results.    She has mod severe underlying bronchiectasis & granulomatous lung dis> stable on her regimen of Advair, Mucinex, chestPT/ post drainage;  due for her yearly f/u CXR today = chr pulm scarring, NAD;  Labs showed Chems OK  x Na 131, CBC OK w/ Hg 12.8, WBC 7.2, Sed 83...     She had an interval BMD 10/11 at [facility] showing TScores +0.3 in Spine, -0.8 in left FemNeck, & -2.1 right forearm (improved in spine but worse in hip & forearm);  treated by DrRomaine on Calcium, MVI, Vit D 1000, and numerous herbs...  ~  November 10, 2010:  64mo ROV & she continues stable overall; she had to cancel her planned trip to [place] when her niece passed away w/ melanoma;  Prev bout of diverticulitis resolved w/ cipro & Flagyl and back to baseline;  She continues her daily postural drain w/ copious sputum production (no recent cultures) & he same bronchodilator regimen; no new complaints or concerns...  ~  May 11, 2011:  66mo ROV & Annisten notes some incr fatigue, dry cough, right sided chest discomfort, & pulse in the 90's;  She takes her Advair250, GFN, & does her chest treatments regularly; she also takes a number of herbs that she believes are beneficial;  She had f/u OV w/ DrPatterson 11/12 after another bout of diverticulitis in Oct... See prob list below>> CXR 3/13 showed essentially no change> normal heart size, prom pulm arts, bilat interstitial prominence & nodularity, bronchiectasis, NAD, surg clips from prev left breast ca surg. LABS 3/13:  FLP- at goals on Diet;  Chems- wnl;  CBC- wnl;  TSH=3.63;  VitD=35.  ~  November 02, 2011:  78mo ROV> Khaleah called for early add-on visit due to recent incr in cough, chest congestion, thick discolored mucus w/ hemoptysis; she had temp at home to 103 she says & brought this down w/ Tylenol; she has assoc fatigue, SOB, chest pain, & headache; she has a long hx of known bronchiectasis, granulomatous lung dis, & recurrent pneumonias; CXR today shows more consolidated left mid zone opacity (her worst area of bronchiectasis); her only home meds are ADVAIR250Bid & Mucinex, she takes a lot of herbal supplements... We decided to treat this exac w/ LEVAQUIN x10d... LABS 9/13:  FLP (ok x LDL=108);  BMET  (wnl);  Sed (elev at 68)  SPUTUM C&S:  PSEUDOMONAS resist to Cipro/ Levaquin/ Maxipeme; sens to Fortaz/ Zosyn/ Imipenem; NOTE> she is already improved after finishing empiric Levaquin & we will continue to follow...    Problem List:     ALLERGIC RHINITIS (ICD-477.9) - she uses OTC antihistamines Prn.  BRONCHIECTASIS (ICD-494.0) & Hx of PNEUMONIA (ICD-486) - on ADVAIR 250 Bid, & MUCINEX 1-2Bid...  Long hx severe bronchiectasis, granulomatous lung dis, and pneumonia (initially Dx 1968 by DrFrazier, subseq bx= noncaseating gran dz & chr inflam)... her last bronchoscopy was 4/04 revealing some mucus plugging... CTChest 4/04 showed marked bronchiectasis in lingular, LLL, RML, and mucus plugging... CT repeated 6/06- similar. ~  Sputum cultures 5/08 grew Serratia (R to amox/ceph, S to all others), branching part acid fast bacilli resembling nocardia, yeast c/w candida... ~  CXR 1/09 chr lung dis, no change from prev w/ left mid lung zone as the worst area... ~  CXR 3/10 w/ chr lung changes and NAD suspected... ~  Sputum 8/10 showed NTF only & AFB smears & cult all neg... ~  CXR 3/11 showed chr lung dis, no acute changes, stable... ~  CXR 3/12 showed chronic changes, scarring, NAD.Marland Kitchen. ~  CXR 3/13 showed essentially no change> normal heart size, prom pulm arts, bilat interstitial prominence & nodularity, bronchiectasis, NAD, surg clips from prev left breast ca surg. ~  9/13: she presents w/ an acute exac w/ cough, thick sput, congestion, fever, SOB, & chest discomfort; CXR shows worse left mid zone opacity; We decided to treat w/ empiric Levaquin x10d pending cult report==> ret w/ resist Pseudomonas, but pt states better/ improved on the Levaquin.  HYPERCHOLESTEROLEMIA, BORDERLINE (ICD-272.4) - on diet alone... ~  FLP 9/10 showed TChol 174, TG 64, HDL 49, LDL 112 ~  FLP 9/11 showed TChol 175, TG 61, HDL 48, LDL 115 ~  FLP 3/13 showed TChol 176, TG 69, HDL 64, LDL 98 ~  FLP 9/13 showed TChol 169, TG 65,  HDL 48, LDL 108  Hx of ADENOCARCINOMA, BREAST (ICD-174.9) - S/P surgery 5/94 [facility] w/ wide excision L breast lesion and LN dissection (stage II 1.0  cm tumor), w/ post op XRT and chemotherapy... she was followed by [facility] for oncology... then DrLivesay (released in 2003). ~  routine Mammogram 2/10 was neg- f/u planned 79yr. ~  f/u mammogram 2/11 = neg, NAD, f/u planned 71yr. ~  f/u mammogram 2/12 = neg, NAD, f/u 58yr. ~  f/u mammogram 2/13 = neg, NAD...  DIVERTICULOSIS OF COLON (ICD-562.10) - colonoscopy 3/05 by DrPatterson showed divertics only... she has +fam hx colon ca w/ colon check q62yrs... f/u colonoscopy 5/11 showed +divertics, no polyps... ~  3/12:  Hx, exam & CTAbd c/w diverticulitis; treated w/ Cipro/ Flagyl & resolved;  Advised Metamucil, Miralax, etc. ~  11/12:  She had f/u DrPatterson  after another bout of diverticulitis; he notes regular BMs, good appetite, stable weight; he wanted her to STOP Naprosyn, ok to use Tylenol/ Tramadol, rec hi fiber, saw the Divetic movie.  DEGENERATIVE JOINT DISEASE (ICD-715.90) - see Ortho eval 2010 by DrDaldorf... she takes Glucosamine & Herbal supplements- milk thistle/ tumeric which she says really helps...  LOW BACK PAIN SYNDROME (ICD-724.2) - LBP and eval by [facility] 2010 w/ MRI showed herniated disc L3-4 w/ mod spinal stenosis and lat recess stenosis bilat...   ROTATOR CUFF SYNDROME, RIGHT (ICD-726.10) - had injection by [facility] 10/07 w/ improvement.  OSTEOPENIA (ICD-733.90) - last BMD at [facility] 10/06 showed norm spine, norm hip, osteopenic forearm (-2.0)... followed by GYN = DrRomaine who is treating a low Vit D level... she takes calcium, Vitamins, & Vit D 1000u daily from [place]... ~  labs 9/10 on 50K weekly showed Vit D level = 42... rec> change to 1000 u daily. ~  labs 9/11 on 1000 u daily showed Vit D level = 34... rec> continue daily supplement. ~  Labs 3/13 showed Vit D level = 35... Continue supplements.  Hx of ANEMIA  (ICD-285.9) -  resolved... ~  labs 9/10 showed Hg= 13.6 ~  labs 9/11 showed Hg= 13.2 ~  Labs 3/13 showed Hg= 13.4  ECZEMA - she has hx of eczema on her hands and has used CoQ10 and Borage Oil as rec by an Dentist in [place]... recently changed to [facility] Current instead of these other supplements & rash is much improved...   Past Surgical History  Procedure Date  . Lung biopsy 1968    TSurg---Dr. Bascom Levels  . Left breast lumpectomy and ln dissection 06/1992    Dr. Ezzard Standing    Outpatient Encounter Prescriptions as of 11/02/2011  Medication Sig Dispense Refill  . acetaminophen (TYLENOL) 325 MG tablet Take 650 mg by mouth every 6 (six) hours as needed.        Marland Kitchen ADVAIR DISKUS 250-50 MCG/DOSE AEPB TAKE 1 PUFF TWICE A DAY  1 each  5  . aspirin 81 MG tablet Take 81 mg by mouth daily.        Marland Kitchen BLACK CURRANT SEED OIL PO Take 1 tablet by mouth daily.        . Calcium Citrate-Vitamin D (CITRACAL MAXIMUM) 315-250 MG-UNIT TABS Take 1 tablet by mouth daily.        Marland Kitchen estradiol (VAGIFEM) 25 MCG vaginal tablet Use 2 times per week       . glucosamine-chondroitin 500-400 MG tablet Take 1 tablet by mouth daily.        . Multiple Vitamins-Minerals (MULTIVITAMIN & MINERAL PO) Take 1 tablet by mouth daily.        . Pseudoephedrine-Guaifenesin (MUCINEX D) [phone] MG TB12 Take 1 tablet by mouth daily.        Marland Kitchen levofloxacin (LEVAQUIN) 500 MG tablet Take 1 tablet (500 mg total) by mouth daily.  10 tablet  0  . traMADol (ULTRAM) 50 MG tablet Take 1 tablet (50 mg total) by mouth every 6 (six) hours as needed for pain. Maximum dose= 8 tablets per day  90 tablet  6  . DISCONTD: Misc Natural Products (SILYMARIN) 50 MG CAPS Take 2 capsules by mouth daily.          No Known Allergies   Current Medications, Allergies, Past Medical History, Past Surgical History, Family History, and Social History were reviewed in [employer] record.     Review of Systems  See HPI - all other systems neg  except as noted...  The patient complains of dyspnea on exertion and abdominal pain.  The patient denies anorexia, fever, weight loss, weight gain, vision loss, decreased hearing, hoarseness, chest pain, syncope, peripheral edema, prolonged cough, headaches, hemoptysis, melena, hematochezia, severe indigestion/heartburn, hematuria, incontinence, muscle weakness, suspicious skin lesions, transient blindness, difficulty walking, depression, unusual weight change, abnormal bleeding, enlarged lymph nodes, and angioedema.     Objective:   Physical Exam     WD, WN, 78 y/o WF in NAD... GENERAL:  Alert & oriented; pleasant & cooperative... HEENT:  [place]/AT, EOM-wnl, PERRLA, EACs-clear, TMs-wnl, NOSE-clear, THROAT-clear & wnl. NECK:  Supple w/ fairROM; no JVD; normal carotid impulses w/o bruits; no thyromegaly or nodules palpated; no lymphadenopathy. CHEST:  scat bilat rhonchi without wheezing, rales or signs of consolidation... HEART:  Regular Rhythm; without murmurs/ rubs/ or gallops heard... ABDOMEN:  Soft w/ mild tender LLQ; normal bowel sounds; no organomegaly or masses detected. EXT: without deformities or arthritic changes; no varicose veins/ venous insuffic/ or edema. NEURO:  CN's intact;  no focal neuro deficits... DERM:  No lesions noted; no rash etc...  RADIOLOGY DATA:  Reviewed in the EPIC EMR & discussed w/ the patient...  LABORATORY DATA:  Reviewed in the EPIC EMR & discussed w/ the patient...    Assessment & Plan:    COPD/ Bronchiectasis/ Hx pneumonia>  On Advair250, Mucinex2Bid, Fluids & vigorous Chest PT/ postural drainage; given sputum specimen cups for sterile collection for C&S; 9/13> treating acute exac w/ Levaquin x10d; Cult grew a resist Pseudomonas but she reports clinically improved, therefore continue present Rx & follow...  CHOL>  On diet alone & f/u FLP is at goal...  Breast Cancer>  S/p surg [facility] & oncology f/u by DrNeijstrom, then [facility] 2003 & released;  yearly mammograms & monthly self exams were wnl...  Divertics>  She had acute diverticulitis 3/12 & 10/12> resolved w/ diet adjust & Cipro/Flagyl, now back to baseline & advised re- Metamucil, Fiberall, etc; she saw DrPatterson 11/12.  DJD/ LBP>  Ortho is DrDaldorf & she takes Glucosamine & Herbs and doing satis she says...  Osteopenia/ Vit D defic>  On Calcium, MVI, Vit D supplement; followed by GYN= DrRomaine...  Other medical issues as noted...    Patient's Medications  New Prescriptions   LEVOFLOXACIN (LEVAQUIN) 500 MG TABLET    Take 1 tablet (500 mg total) by mouth daily.  Previous Medications   ACETAMINOPHEN (TYLENOL) 325 MG TABLET    Take 650 mg by mouth every 6 (six) hours as needed.     ADVAIR DISKUS 250-50 MCG/DOSE AEPB    TAKE 1 PUFF TWICE A DAY   ASPIRIN 81 MG TABLET    Take 81 mg by mouth daily.     BLACK CURRANT SEED OIL PO    Take 1 tablet by mouth daily.     CALCIUM CITRATE-VITAMIN D (CITRACAL MAXIMUM) 315-250 MG-UNIT TABS    Take 1 tablet by mouth daily.     ESTRADIOL (VAGIFEM) 25 MCG VAGINAL TABLET    Use 2 times per week    GLUCOSAMINE-CHONDROITIN 500-400 MG TABLET    Take 1 tablet by mouth daily.     MULTIPLE VITAMINS-MINERALS (MULTIVITAMIN & MINERAL PO)    Take 1 tablet by mouth daily.     PSEUDOEPHEDRINE-GUAIFENESIN (MUCINEX D) [phone] MG TB12    Take 1 tablet by mouth daily.     TRAMADOL (ULTRAM) 50 MG TABLET    Take  1 tablet (50 mg total) by mouth every 6 (six) hours as needed for pain. Maximum dose= 8 tablets per day  Modified Medications   No medications on file  Discontinued Medications   MISC NATURAL PRODUCTS (SILYMARIN) 50 MG CAPS    Take 2 capsules by mouth daily.

## 2011-11-09 LAB — RESPIRATORY CULTURE OR RESPIRATORY AND SPUTUM CULTURE

## 2011-11-11 ENCOUNTER — Ambulatory Visit: Payer: Medicare Other | Admitting: Pulmonary Disease

## 2011-11-12 ENCOUNTER — Telehealth: Payer: Self-pay | Admitting: Pulmonary Disease

## 2011-11-12 NOTE — Telephone Encounter (Signed)
Spoke with pt. She states calling to get results of sputum culture. Please advise, thanks!

## 2011-11-12 NOTE — Telephone Encounter (Signed)
I talked to pt 9/19>  sput grew Pseudomonas, resist to oral antibiotics (see report);  She is feeling better after 10d po Levaquin w/ her Nebs, Mucinex, Postural drain, etc... I have asked her to watch for any recurrent cough, discolored mucus, f/c/s, feeling bad, etc> and plan going to the ER for IV antibiotics if symptoms recur.Marland KitchenMarland Kitchen

## 2011-11-13 ENCOUNTER — Other Ambulatory Visit (INDEPENDENT_AMBULATORY_CARE_PROVIDER_SITE_OTHER): Payer: Medicare Other

## 2011-11-13 DIAGNOSIS — J479 Bronchiectasis, uncomplicated: Secondary | ICD-10-CM

## 2011-11-13 DIAGNOSIS — E785 Hyperlipidemia, unspecified: Secondary | ICD-10-CM

## 2011-11-13 DIAGNOSIS — J471 Bronchiectasis with (acute) exacerbation: Secondary | ICD-10-CM

## 2011-11-13 LAB — BASIC METABOLIC PANEL
BUN: 16 mg/dL (ref 6–23)
CO2: 31 mEq/L (ref 19–32)
Calcium: 8.9 mg/dL (ref 8.4–10.5)
Chloride: 99 mEq/L (ref 96–112)
Creatinine, Ser: 0.5 mg/dL (ref 0.4–1.2)
GFR: 121.7 mL/min (ref >60.00)
Glucose, Bld: 91 mg/dL (ref 70–99)
Potassium: 4.6 mEq/L (ref 3.5–5.1)
Sodium: 135 mEq/L (ref 135–145)

## 2011-11-13 LAB — LIPID PANEL
Cholesterol: 169 mg/dL (ref 0–200)
HDL: 47.8 mg/dL (ref >39.00)
LDL Cholesterol: 108 mg/dL — ABNORMAL HIGH (ref 0–99)
Total CHOL/HDL Ratio: 4
Triglycerides: 65 mg/dL (ref 0.0–149.0)
VLDL: 13 mg/dL (ref 0.0–40.0)

## 2011-11-13 LAB — SEDIMENTATION RATE: Sed Rate: 56 mm/hr — ABNORMAL HIGH (ref 0–22)

## 2011-11-17 ENCOUNTER — Telehealth: Payer: Self-pay | Admitting: Pulmonary Disease

## 2011-11-17 NOTE — Telephone Encounter (Signed)
Pt aware of results 

## 2011-11-18 ENCOUNTER — Telehealth: Payer: Self-pay | Admitting: Gastroenterology

## 2011-11-19 ENCOUNTER — Other Ambulatory Visit: Payer: Medicare Other

## 2011-11-19 ENCOUNTER — Ambulatory Visit (INDEPENDENT_AMBULATORY_CARE_PROVIDER_SITE_OTHER): Payer: Medicare Other | Admitting: Nurse Practitioner

## 2011-11-19 ENCOUNTER — Encounter: Payer: Self-pay | Admitting: Nurse Practitioner

## 2011-11-19 VITALS — BP 132/64 | HR 84 | Ht 64.0 in | Wt 139.0 lb

## 2011-11-19 DIAGNOSIS — R109 Unspecified abdominal pain: Secondary | ICD-10-CM

## 2011-11-19 DIAGNOSIS — R1030 Lower abdominal pain, unspecified: Secondary | ICD-10-CM

## 2011-11-19 LAB — URINALYSIS WITH CULTURE, IF INDICATED
Bilirubin Urine: NEGATIVE
Ketones, ur: NEGATIVE
Leukocytes, UA: NEGATIVE
Nitrite: NEGATIVE
Specific Gravity, Urine: <=1.005 (ref 1.000–1.030)
Total Protein, Urine: NEGATIVE
Urine Glucose: NEGATIVE
Urobilinogen, UA: 0.2 (ref 0.0–1.0)
pH: 6 (ref 5.0–8.0)

## 2011-11-19 MED ORDER — METRONIDAZOLE 250 MG PO TABS: 250.0000 mg | ORAL_TABLET | Freq: Three times a day (TID) | ORAL | Status: AC

## 2011-11-19 MED ORDER — CIPROFLOXACIN HCL 250 MG PO TABS: 250.0000 mg | ORAL_TABLET | Freq: Two times a day (BID) | ORAL | Status: AC

## 2011-11-19 NOTE — Telephone Encounter (Signed)
Late entry  11/18/11  Pt reports she's had diverticulitis type s&s for the past few days. She states the pain started on her r side, but has radiated to entire lower abdomen since. She doesn't want it to get out of hand and denies bleeding or diarrhea, pt given an appt with Willette Cluster, NP today. Pt stated understanding.

## 2011-11-19 NOTE — Patient Instructions (Addendum)
Please go to the basement level to the lab for a Urinalysis. We have sent prescriptons to [facility] [address] for Cipro and Metronidazole ( Flagyl).

## 2011-11-19 NOTE — Progress Notes (Signed)
11/19/2011 RUBINA CARBONARO [id number] 20-Mar-1933   History of Present Illness:  Patient is a 78 year old female known to Dr. Jarold Motto for a history of recurrent diverticulitis, last episode March 2012 (CTscan.). Patient was seen last in November 2012 at which time she was advised to follow a high fiber diet, use daily Benefiber and followup in 3 months time. Patient and husband forgot to make a 3 month followup. Her last colonoscopy was May 2011, done for family history of colon cancer in father. Findings included scattered diverticula in the sigmoid and descending colon. Patient is in today with her husband for evaluation of recurrent abdominal pain.  Patient has a history of bronchiectasis followed by Dr. Kriste Basque. Two weeks ago she had difficulty breathing and temp of 103. Dr Kriste Basque gave her 10 days of Levaquin for a respiratory infection . One week ago she devloped RLQ quadrant discomfort which is now nore diffuse across lower abdomen. No urinary symptoms. No bowel changes. No fevers. Pain similair to diverticulitis.   Current Medications, Allergies, Past Medical History, Past Surgical History, Family History and Social History were reviewed in [employer] record.   Physical Exam: General: Well developed , white female in no acute distress Head: Normocephalic and atraumatic Eyes:  sclerae anicteric, conjunctiva pink  Ears: Normal auditory acuity Lungs: Clear throughout to auscultation Heart: Regular rate and rhythm Abdomen: Soft, non distended, mild RLQ tenderness. No masses, no hepatomegaly. Normal bowel sounds Musculoskeletal: Symmetrical with no gross deformities  Extremities: No edema  Neurological: Alert oriented x 4, grossly nonfocal Psychological:  Alert and cooperative. Normal mood and affect  Assessment and Recommendations: Lower abdominal pain , predominately right-sided. Pain reminiscent of diverticulitis type pain though her diverticular disease is left  sided . Will treat empirically with a 10 day course of Cipro and Flagyl. Of note, patient had an abnormal u/a on 10/30/11 suggestive of UTI but apparently she was not treated with antibiotics. She did take a course of Levaquin for a pulmonary infection recently and that may have treated the UTI as well but need to recheck u/a given lower abdominal pain. Advised patient to follow up with Dr. Juanda Chance in 6-8 weeks. She will call in the interim if worsening symptoms.

## 2011-11-20 NOTE — Progress Notes (Signed)
Reviewed and agree with management. Jarom Govan D. Javarus Dorner, M.D., FACG  

## 2011-11-23 ENCOUNTER — Telehealth: Payer: Self-pay | Admitting: Nurse Practitioner

## 2011-11-23 NOTE — Telephone Encounter (Signed)
Patient given results as per Willette Cluster, NP notes.

## 2011-11-25 ENCOUNTER — Telehealth: Payer: Self-pay | Admitting: Pulmonary Disease

## 2011-11-25 ENCOUNTER — Other Ambulatory Visit: Payer: Medicare Other

## 2011-11-25 DIAGNOSIS — R042 Hemoptysis: Secondary | ICD-10-CM

## 2011-11-25 NOTE — Telephone Encounter (Signed)
Per SN---ok to collect a specimen in a sterile cup  And bring to the lab for routine sputum culture and C & S and afb culture.  thanks

## 2011-11-25 NOTE — Telephone Encounter (Signed)
I spoke with pt and she stated today she has gotten up 3 tsp of blood in her mucus all together today. She has spit some up in a napkin. Stated this did happen also last week. She is wanting to know if SN would like for her to leave sputum sample in the lab. Please advise thanks

## 2011-11-25 NOTE — Telephone Encounter (Signed)
Called spoke with patient, advised of SN's recs as stated below.  Pt stated that she already has a sterile cup at home and will bring sample to the lab once obtained.  Pt aware will call once results are available.  Will sign off.

## 2011-11-28 ENCOUNTER — Other Ambulatory Visit: Payer: Self-pay | Admitting: Pulmonary Disease

## 2011-11-29 LAB — RESPIRATORY CULTURE OR RESPIRATORY AND SPUTUM CULTURE

## 2011-12-02 ENCOUNTER — Telehealth: Payer: Self-pay | Admitting: Pulmonary Disease

## 2011-12-02 NOTE — Telephone Encounter (Signed)
Please advise thanks.

## 2011-12-02 NOTE — Telephone Encounter (Signed)
Per SN--culture has grown pseudomonas---this is a common bacteria in bronchiectasis  But it is resistant to all oral meds.  The only option is to treat severe infections and pneumonia (when they occur) with IV meds in the hospital.     In the meanwhile, cont the advair, mucinex, fluids, chest therapy and drainage and also take a probiotic like align daily.  Called and spoke with pt and she voiced her understanding of these results. Nothing further is needed.

## 2011-12-03 ENCOUNTER — Encounter: Payer: Self-pay | Admitting: Pulmonary Disease

## 2011-12-05 ENCOUNTER — Encounter: Payer: Self-pay | Admitting: Pulmonary Disease

## 2011-12-08 ENCOUNTER — Ambulatory Visit (INDEPENDENT_AMBULATORY_CARE_PROVIDER_SITE_OTHER): Payer: Medicare Other | Admitting: Sports Medicine

## 2011-12-08 ENCOUNTER — Encounter: Payer: Self-pay | Admitting: Sports Medicine

## 2011-12-08 VITALS — BP 167/79 | HR 79 | Ht 64.0 in | Wt 139.0 lb

## 2011-12-08 DIAGNOSIS — M1612 Unilateral primary osteoarthritis, left hip: Secondary | ICD-10-CM

## 2011-12-08 DIAGNOSIS — M169 Osteoarthritis of hip, unspecified: Secondary | ICD-10-CM

## 2011-12-08 NOTE — Patient Instructions (Addendum)
Icing hip and knee may be helpful especially if there is any swelling  Continue current exercise regimen  Take tramadol as needed daily  Ok to do easy walking for exercise as tolerated  It is too close to your recent medical issues for Dr. Darrick Penna to do cortisone injection today  Please follow up around December or before if needed.   Thank you for seeing [place] today!

## 2011-12-08 NOTE — Assessment & Plan Note (Signed)
While I suspect part of her pain is related to her arthritis she may have developed a greater trochanteric bursitis as well  Because she has recently been on antibiotics with ciprofloxacin and also has recently had flares of both diverticulitis and bronchiectasis, I felt we should delay any use of injectable corticosteroids  I would not wish to give her injections more than every 3 months  She will return in late December and if the knee continues to hurt we will do an injection at that time  Given a series of hip exercises help with rehabilitation in the shorter term  Keep her walking at a comfortable level and do more frequent short walks rather than longer ones

## 2011-12-08 NOTE — Progress Notes (Signed)
  Subjective:    Patient ID: Miranda Thompson, female    DOB: 1933-12-31, 78 y.o.   MRN: [id number]  HPI  Pt presents for f/u of lt hip pain which has been worse lately.  She has only been able to walk 1/2 mile before hip pain starts, used to walk 4 miles per day.  Has had bronchietasis, dx with pseudamonas in the lungs within last month. Also recently has had flare of diverticulitis.  She was on Cipro and Flagyl for this Takes tramadol at bedtime for pain - because it makes her drowsy.  She did get a great deal of relief from injection of her right knee about 6 weeks ago    Review of Systems     Objective:   Physical Exam Pleasant and in no acute distress Left hip 25 deg ER  10-15 deg IR 120 deg flexion Pain with hip abduction, but has good strength  Rt hip 20 deg IR 30 deg ER  135 flexion  There is some mild direct tenderness over the left greater trochanter  Walking gait without any significant limp      Assessment & Plan:

## 2011-12-16 ENCOUNTER — Ambulatory Visit: Payer: Medicare Other | Admitting: Sports Medicine

## 2011-12-17 ENCOUNTER — Telehealth: Payer: Self-pay | Admitting: Pulmonary Disease

## 2011-12-17 DIAGNOSIS — I272 Pulmonary hypertension, unspecified: Secondary | ICD-10-CM

## 2011-12-17 NOTE — Telephone Encounter (Signed)
According to last pt email this was suppose to be ordered for pt. I have placed order and pt aware she will be called with an appt. Nothing further was needed

## 2011-12-18 ENCOUNTER — Telehealth: Payer: Self-pay | Admitting: Pulmonary Disease

## 2011-12-18 NOTE — Telephone Encounter (Signed)
Returned patient's call and she stated that she was looking on her my chart information and was confused because the 2D Echo information on there stated "Echo 1" and she was suppose to have a 2 D Echo and that it had listed [place]. Explained to patient that the "Echo 1" was not the test but the Exam room that the test was to be done and the [facility] is because we are a part of [place]. Pt just wanted verification that the test was still a 2D Echo scheduled at [facility] at [address] 3rd floor on 12/23/11 at 1:00. Pt voiced understanding and is aware of appointment date, time and location. Rhonda J Cobb

## 2011-12-23 ENCOUNTER — Ambulatory Visit (HOSPITAL_COMMUNITY): Payer: Medicare Other | Attending: Cardiology | Admitting: Radiology

## 2011-12-23 DIAGNOSIS — J4489 Other specified chronic obstructive pulmonary disease: Secondary | ICD-10-CM | POA: Insufficient documentation

## 2011-12-23 DIAGNOSIS — J449 Chronic obstructive pulmonary disease, unspecified: Secondary | ICD-10-CM | POA: Insufficient documentation

## 2011-12-23 DIAGNOSIS — I272 Pulmonary hypertension, unspecified: Secondary | ICD-10-CM

## 2011-12-23 DIAGNOSIS — I369 Nonrheumatic tricuspid valve disorder, unspecified: Secondary | ICD-10-CM | POA: Insufficient documentation

## 2011-12-23 DIAGNOSIS — I270 Primary pulmonary hypertension: Secondary | ICD-10-CM | POA: Insufficient documentation

## 2011-12-23 DIAGNOSIS — I059 Rheumatic mitral valve disease, unspecified: Secondary | ICD-10-CM | POA: Insufficient documentation

## 2011-12-23 NOTE — Progress Notes (Signed)
Echocardiogram performed.  

## 2011-12-25 ENCOUNTER — Telehealth: Payer: Self-pay | Admitting: Pulmonary Disease

## 2011-12-25 NOTE — Telephone Encounter (Signed)
Called pt and reviewed with her the results of her echo per SN.  Pt is aware. Nothing further is needed.

## 2012-01-11 LAB — AFB CULTURE WITH SMEAR (NOT AT ARMC): Acid Fast Smear: NONE SEEN

## 2012-01-28 ENCOUNTER — Telehealth: Payer: Self-pay | Admitting: Gastroenterology

## 2012-01-28 NOTE — Telephone Encounter (Signed)
Pt reports same s&s as her last flare and would like to be seen. Pt to see Mike Gip, PA tomorrow pm.

## 2012-01-29 ENCOUNTER — Encounter: Payer: Self-pay | Admitting: Physician Assistant

## 2012-01-29 ENCOUNTER — Ambulatory Visit (INDEPENDENT_AMBULATORY_CARE_PROVIDER_SITE_OTHER): Payer: Medicare Other | Admitting: Physician Assistant

## 2012-01-29 VITALS — BP 130/60 | HR 80 | Ht 63.75 in | Wt 135.4 lb

## 2012-01-29 DIAGNOSIS — K5792 Diverticulitis of intestine, part unspecified, without perforation or abscess without bleeding: Secondary | ICD-10-CM

## 2012-01-29 DIAGNOSIS — K5732 Diverticulitis of large intestine without perforation or abscess without bleeding: Secondary | ICD-10-CM

## 2012-01-29 MED ORDER — AMOXICILLIN-POT CLAVULANATE 875-125 MG PO TABS: 1.0000 | ORAL_TABLET | Freq: Two times a day (BID) | ORAL | Status: AC

## 2012-01-29 NOTE — Progress Notes (Signed)
Subjective:    Patient ID: Miranda Thompson, female    DOB: [birth year], 78 y.o.   MRN: [id number]  HPI Deidre is a very nice 78 year old female known to Dr. Jarold Motto who has history of diverticular disease. Her history is also positive for breast cancer, bronchiectasis degenerative joint disease and osteoarthritis. She has family history of colon cancer in her father. She last had colonoscopy in May of 2011 which showed scattered diverticuli in the descending and sigmoid colon, no polyps. She was treated for an episode of diverticulitis in March of 2012, then had another exacerbation in September of 2013 which responded to Cipro and Flagyl. She comes back in today with onset of acute lower abdominal pain 3 days ago. Her pain has been fairly constant but not severe, initially noticing it more in the right lower quadrant and now centrally in her lower abdomen. She has had some increasing bloating and gas no nausea or vomiting, no diarrhea or melena. She has been able to eat and does not feel that her pain is necessarily worse postprandially. She denies any dysuria urgency or frequency. On review with patient and her husband they had been eating some popcorn over the past week. She is concerned about antibiotics because she is scheduled to have an injection of her hip next week, and was told by the provider that she could not have an injection within 3 months of taking Cipro.    Review of Systems  Constitutional: Negative.   HENT: Negative.   Eyes: Negative.   Respiratory: Negative.   Cardiovascular: Negative.   Gastrointestinal: Positive for abdominal pain.  Genitourinary: Negative.   Musculoskeletal: Positive for back pain and arthralgias.  Neurological: Negative.   Hematological: Negative.   Psychiatric/Behavioral: Negative.    Outpatient Prescriptions Prior to Visit  Medication Sig Dispense Refill  . acetaminophen (TYLENOL) 325 MG tablet Take 650 mg by mouth every 6 (six) hours as  needed.        Marland Kitchen ADVAIR DISKUS 250-50 MCG/DOSE AEPB INHALE 1 PUFF TWICE A DAY  60 each  11  . aspirin 81 MG tablet Take 81 mg by mouth daily.        Marland Kitchen BLACK CURRANT SEED OIL PO Take 1 tablet by mouth daily.        Marland Kitchen estradiol (VAGIFEM) 25 MCG vaginal tablet Use 2 times per week       . glucosamine-chondroitin 500-400 MG tablet Take 1 tablet by mouth daily.        . Multiple Vitamins-Minerals (MULTIVITAMIN & MINERAL PO) Take 1 tablet by mouth daily.        . Pseudoephedrine-Guaifenesin (MUCINEX D) [id number] MG TB12 Take 1 tablet by mouth daily.        . Calcium Citrate-Vitamin D (CITRACAL MAXIMUM) 315-250 MG-UNIT TABS Take 1 tablet by mouth daily.         No Known Allergies Patient Active Problem List  Diagnosis  . ADENOCARCINOMA, BREAST  . HYPERCHOLESTEROLEMIA, BORDERLINE  . ANEMIA  . ALLERGIC RHINITIS  . PNEUMONIA  . BRONCHIECTASIS  . BRONCHIECTASIS WITH ACUTE EXACERBATION  . DIVERTICULOSIS OF COLON  . ACUTE CYSTITIS  . DEGENERATIVE JOINT DISEASE  . LOW BACK PAIN SYNDROME  . ROTATOR CUFF SYNDROME, RIGHT  . OSTEOPENIA  . Hemoptysis  . DIVERTICULITIS, ACUTE  . Cough  . Diverticular disease of colon  . Left ovarian cyst  . Osteoarthritis of left hip  . Leg length inequality  . Knee pain, bilateral  . Lower abdominal pain  History  Substance Use Topics  . Smoking status: Former Smoker -- 40 years    Types: Cigarettes    Quit date: 02/24/1967  . Smokeless tobacco: Never Used  . Alcohol Use: Yes     Comment: social use       Objective:   Physical Exam well-developed older white female in no acute distress, pleasant blood pressure 130/60 pulse 80 height 5 foot 3 weight 135. HEENT; nontraumatic normocephalic EOMI PERRLA sclera anicteric,Neck; Supple no JVD, Cardiovascular; regular rate and rhythm with S1-S2 no murmur or gallop, Pulmonary; clear bilaterally, Abdomen ;soft she is tender in the suprapubic area and right lower quadrant more so than left lower quadrant- there is  no guarding or rebound no palpable mass or hepatosplenomegaly, bowel sounds are active, Rectal; exam not done, Extremities; no clubbing cyanosis or edema skin warm and dry, Psych; mood and affect normal and appropriate.        Assessment & Plan:  #28 78 year old female with history of left-sided diverticulosis, with recurrent acute diverticulitis. This is her third episode in the past 18 months. #2 family history of colon cancer in patient's father-patient up-to-date on colonoscopy will be due for followup 06/2014. #3 bronchiectasis #4 osteoarthritis #5 history of breast cancer  Plan; Will start Augmentin 875 mg by mouth twice daily x10 days. She is already taking Align one by mouth daily and will continue. Discussed lower fiber diet over the next few days until her symptoms improve, and general avoidance of nuts and popcorn She was offered an analgesic, but has Ultram at home which will she will use as needed Patient is aware that she should call if her symptoms worsen and/or if she has not completely resolved when she finishes her antibiotics

## 2012-01-29 NOTE — Patient Instructions (Addendum)
Lay off nuts and popcorn. We sent a prescription for Augmentin 875 mg to [facility] [address]. Call if your pain worsens. Call if your pain is not completely gone after finishing the antibiotic.   Continue the Align probiotic.

## 2012-02-01 ENCOUNTER — Telehealth: Payer: Self-pay | Admitting: *Deleted

## 2012-02-01 NOTE — Telephone Encounter (Signed)
  Sheryn Bison, MD 02/01/2012 2:43 PM Signed  I agree with assessment and plan. Please start her on Lialda 1.2, g 2 tablets a day. I would like to see her back in one month's time for followup.  lmom for pt to call back.

## 2012-02-01 NOTE — Progress Notes (Signed)
I agree with assessment and plan.  Please start her on Lialda 1.2, g 2 tablets a day.  I would like to see her back in one month's time for followup.

## 2012-02-02 ENCOUNTER — Ambulatory Visit (INDEPENDENT_AMBULATORY_CARE_PROVIDER_SITE_OTHER): Payer: Medicare Other | Admitting: Sports Medicine

## 2012-02-02 ENCOUNTER — Encounter: Payer: Self-pay | Admitting: Sports Medicine

## 2012-02-02 VITALS — BP 162/82 | HR 108

## 2012-02-02 DIAGNOSIS — M76899 Other specified enthesopathies of unspecified lower limb, excluding foot: Secondary | ICD-10-CM

## 2012-02-02 DIAGNOSIS — M7062 Trochanteric bursitis, left hip: Secondary | ICD-10-CM

## 2012-02-02 NOTE — Progress Notes (Signed)
  Subjective:    Patient ID: Miranda Thompson, female    DOB: 24-May-1933, 78 y.o.   MRN: [id number]  HPI This is a patient with a history of degenerative joint disease of her hips and bilateral knees who presents today with left hip pain and right knee pain.  She says her hip pain is more severe. Her pain limits her walking she used to be able to walk a few miles a day.  Regarding her knee osteoarthritis, she had received a right knee glucocorticoid injection on 10/15/2011 and reported significant relief, however, the pain has started to return. Tramadol helps her pains.   Review of Systems  Allergies, medication, past medical history reviewed.      Objective:   Physical Exam GEN: NAD; elderly PSYCH: normal affect; pleasant LEFT HIP:   Tenderness: severe over trochanteric bursa   ROM: 30 deg external rotation, 15 deg internal rotation (40 and 20 respectively on right)   Strength: 4/5 abduction and flexion (equivalent on right) RIGHT KNEE:    Inspection: bone spurs medial femoral epicondyles bilaterally; no warmth, erythema, or obvious swelling   ROM: extension: 3-4 deg short of full extension (5 deg short of full extension of left)   Strength: intact  Procedure: left torochanteric bursa glucocorticoid injection Risks and benefits discussed with patient. We discussed potential risk of infection especially with her history of diverticulitis and pneumonia and she was advised to follow-up and notify our clinic if she developed signs/symptoms. She expressed understanding and was amenable to injection.  25 gauge needle inserted medial knee and 4 mL of 1% lidocaine without epinephrine and 40 mg of Kenalog injected.  Needle removed and area covered with band-aide Minimal blood loss     Assessment & Plan:

## 2012-02-02 NOTE — Patient Instructions (Addendum)
It may be more sore next 2 days You may need to take extra Tramadol  After about 3 days, try those exercises (flex knee and move knee to the side)  Follow-up as needed

## 2012-02-02 NOTE — Assessment & Plan Note (Addendum)
Left greater trochanteric bursitis managed with glucocorticoid injection today. The systemic effects of steroid may also help pain from right knee DJD as well. She was advised to continue Tramadol as needed for pain, encouraged to do hip strengthening exercises, and given indications to return to clinic or call if she developed signs/symptoms of infections.

## 2012-03-02 NOTE — Telephone Encounter (Signed)
Pt never called back.

## 2012-03-15 ENCOUNTER — Ambulatory Visit (INDEPENDENT_AMBULATORY_CARE_PROVIDER_SITE_OTHER): Payer: Medicare Other | Admitting: Sports Medicine

## 2012-03-15 VITALS — BP 140/64 | Ht 64.0 in | Wt 131.0 lb

## 2012-03-15 DIAGNOSIS — M76899 Other specified enthesopathies of unspecified lower limb, excluding foot: Secondary | ICD-10-CM

## 2012-03-15 DIAGNOSIS — M1990 Unspecified osteoarthritis, unspecified site: Secondary | ICD-10-CM

## 2012-03-15 DIAGNOSIS — M7062 Trochanteric bursitis, left hip: Secondary | ICD-10-CM

## 2012-03-15 NOTE — Assessment & Plan Note (Signed)
Pain has essentially resolved after injection. Keep up some hip rotation and abduction to try to strengthen this area. We will recheck on return to clinic

## 2012-03-15 NOTE — Progress Notes (Signed)
HPI: Patient is a 79 y.o. female who presents for followup of her knee osteoarthritis, she had received a right knee glucocorticoid injection on 10/15/2011 and reported significant relief, however, the pain has started to return. Tramadol helps her pains.  She takes two tramadol daily. She states she has also tried tart red cherry juice over the last couple of months and feels that has helped her pain; she's now walking up stairs with help of banister whereas before she was having to ascend the stairs on all fours. Prior to August, she states she was walking four miles a day.  Now she is able to walk up to one-half mile, which takes her about 10 minutes to do.  On her last visit she had an injection into her left hip where she had a trochanteric bursitis. She states that this essentially relieved all of her pain. She is now doing hip exercises to help increase the strength.  Physical Exam: General: well-developed; in no apparent distress Right knee: -No edema or erythema noted. - 1+ crepitus (1-2+ in left knee_ -negative anterior drawer/posterior drawer and McMurray's test -stable to varus and valgus testing - +TTP of inferior patellar aspect (medially and laterally) - Patient with slightly less than 180 degrees on extension. - Good flexion of knee  joints noted.  Hip and quadriceps strength are improved

## 2012-03-15 NOTE — Assessment & Plan Note (Signed)
Knee degenerative joint disease of her knees is improved with treatment in the hips are not bothering her that much at this time  Keep up home exercise program  Keep up knee compression sleeves  Recheck in 3 months

## 2012-03-24 ENCOUNTER — Other Ambulatory Visit: Payer: Self-pay | Admitting: Sports Medicine

## 2012-04-09 ENCOUNTER — Other Ambulatory Visit: Payer: Self-pay

## 2012-04-26 ENCOUNTER — Telehealth: Payer: Self-pay

## 2012-04-26 DIAGNOSIS — M899 Disorder of bone, unspecified: Secondary | ICD-10-CM

## 2012-04-26 DIAGNOSIS — F411 Generalized anxiety disorder: Secondary | ICD-10-CM

## 2012-04-26 DIAGNOSIS — D649 Anemia, unspecified: Secondary | ICD-10-CM

## 2012-04-26 DIAGNOSIS — K5730 Diverticulosis of large intestine without perforation or abscess without bleeding: Secondary | ICD-10-CM

## 2012-04-26 DIAGNOSIS — E785 Hyperlipidemia, unspecified: Secondary | ICD-10-CM

## 2012-04-26 NOTE — Telephone Encounter (Signed)
Please advise on labs for 6 month rov 05/02/12, thanks

## 2012-04-27 ENCOUNTER — Other Ambulatory Visit (INDEPENDENT_AMBULATORY_CARE_PROVIDER_SITE_OTHER): Payer: Medicare Other

## 2012-04-27 DIAGNOSIS — M899 Disorder of bone, unspecified: Secondary | ICD-10-CM

## 2012-04-27 DIAGNOSIS — E785 Hyperlipidemia, unspecified: Secondary | ICD-10-CM

## 2012-04-27 DIAGNOSIS — F411 Generalized anxiety disorder: Secondary | ICD-10-CM

## 2012-04-27 DIAGNOSIS — D649 Anemia, unspecified: Secondary | ICD-10-CM

## 2012-04-27 DIAGNOSIS — M949 Disorder of cartilage, unspecified: Secondary | ICD-10-CM

## 2012-04-27 LAB — LIPID PANEL
Cholesterol: 157 mg/dL (ref 0–200)
HDL: 45.9 mg/dL (ref >39.00)
LDL Cholesterol: 100 mg/dL — ABNORMAL HIGH (ref 0–99)
Total CHOL/HDL Ratio: 3
Triglycerides: 54 mg/dL (ref 0.0–149.0)
VLDL: 10.8 mg/dL (ref 0.0–40.0)

## 2012-04-27 LAB — CBC WITH DIFFERENTIAL/PLATELET
Basophils Absolute: 0.1 10*3/uL (ref 0.0–0.1)
Basophils Relative: 1.2 % (ref 0.0–3.0)
Eosinophils Absolute: 0.2 10*3/uL (ref 0.0–0.7)
Eosinophils Relative: 3.5 % (ref 0.0–5.0)
HCT: 38.8 % (ref 36.0–46.0)
Hemoglobin: 13.3 g/dL (ref 12.0–15.0)
Lymphocytes Relative: 28.6 % (ref 12.0–46.0)
Lymphs Abs: 1.2 10*3/uL (ref 0.7–4.0)
MCHC: 34.3 g/dL (ref 30.0–36.0)
MCV: 93.8 fl (ref 78.0–100.0)
Monocytes Absolute: 0.4 10*3/uL (ref 0.1–1.0)
Monocytes Relative: 8.5 % (ref 3.0–12.0)
Neutro Abs: 2.5 10*3/uL (ref 1.4–7.7)
Neutrophils Relative %: 58.2 % (ref 43.0–77.0)
Platelets: 275 10*3/uL (ref 150.0–400.0)
RBC: 4.14 Mil/uL (ref 3.87–5.11)
RDW: 12.7 % (ref 11.5–14.6)
WBC: 4.3 10*3/uL — ABNORMAL LOW (ref 4.5–10.5)

## 2012-04-27 LAB — BASIC METABOLIC PANEL
BUN: 15 mg/dL (ref 6–23)
CO2: 28 mEq/L (ref 19–32)
Calcium: 8.9 mg/dL (ref 8.4–10.5)
Chloride: 101 mEq/L (ref 96–112)
Creatinine, Ser: 0.6 mg/dL (ref 0.4–1.2)
GFR: 105.06 mL/min (ref >60.00)
Glucose, Bld: 84 mg/dL (ref 70–99)
Potassium: 4.3 mEq/L (ref 3.5–5.1)
Sodium: 136 mEq/L (ref 135–145)

## 2012-04-27 LAB — URINALYSIS
Bilirubin Urine: NEGATIVE
Ketones, ur: NEGATIVE
Leukocytes, UA: NEGATIVE
Nitrite: NEGATIVE
Specific Gravity, Urine: 1.01 (ref 1.000–1.030)
Total Protein, Urine: NEGATIVE
Urine Glucose: NEGATIVE
Urobilinogen, UA: 0.2 (ref 0.0–1.0)
pH: 8 (ref 5.0–8.0)

## 2012-04-27 LAB — HEPATIC FUNCTION PANEL
ALT: 16 U/L (ref 0–35)
AST: 25 U/L (ref 0–37)
Albumin: 3.6 g/dL (ref 3.5–5.2)
Alkaline Phosphatase: 64 U/L (ref 39–117)
Bilirubin, Direct: 0.2 mg/dL (ref 0.0–0.3)
Total Bilirubin: 0.7 mg/dL (ref 0.3–1.2)
Total Protein: 7.6 g/dL (ref 6.0–8.3)

## 2012-04-27 LAB — TSH: TSH: 2.37 u[IU]/mL (ref 0.35–5.50)

## 2012-04-27 NOTE — Telephone Encounter (Signed)
Pt returned call.  Would like to know if the labs have been ordered yet.  Please advise.  Antionette Fairy

## 2012-04-27 NOTE — Telephone Encounter (Signed)
Called and spoke with pt and she is aware of labs in the computer for the pt and she will come by today for these.

## 2012-04-27 NOTE — Telephone Encounter (Signed)
Called, spoke with pt.  She has an appt with SN on Monday.  She would like to come in today for labs.  Dr. Kriste Basque, pls advise on labs pt needs.  That you.

## 2012-04-28 LAB — VITAMIN D 25 HYDROXY (VIT D DEFICIENCY, FRACTURES): Vit D, 25-Hydroxy: 31 ng/mL (ref 30–89)

## 2012-05-02 ENCOUNTER — Ambulatory Visit (INDEPENDENT_AMBULATORY_CARE_PROVIDER_SITE_OTHER): Payer: Medicare Other | Admitting: Pulmonary Disease

## 2012-05-02 ENCOUNTER — Encounter: Payer: Self-pay | Admitting: Pulmonary Disease

## 2012-05-02 VITALS — BP 114/78 | HR 86 | Temp 97.9°F | Ht 64.0 in | Wt 132.2 lb

## 2012-05-02 DIAGNOSIS — M545 Low back pain, unspecified: Secondary | ICD-10-CM

## 2012-05-02 DIAGNOSIS — J479 Bronchiectasis, uncomplicated: Secondary | ICD-10-CM

## 2012-05-02 DIAGNOSIS — K5730 Diverticulosis of large intestine without perforation or abscess without bleeding: Secondary | ICD-10-CM

## 2012-05-02 DIAGNOSIS — M1990 Unspecified osteoarthritis, unspecified site: Secondary | ICD-10-CM

## 2012-05-02 DIAGNOSIS — E785 Hyperlipidemia, unspecified: Secondary | ICD-10-CM

## 2012-05-02 DIAGNOSIS — C50919 Malignant neoplasm of unspecified site of unspecified female breast: Secondary | ICD-10-CM

## 2012-05-02 DIAGNOSIS — M949 Disorder of cartilage, unspecified: Secondary | ICD-10-CM

## 2012-05-02 DIAGNOSIS — M899 Disorder of bone, unspecified: Secondary | ICD-10-CM

## 2012-05-02 MED ORDER — LEVOFLOXACIN 750 MG PO TABS: 750.0000 mg | ORAL_TABLET | Freq: Every day | ORAL | Status: DC

## 2012-05-02 NOTE — Patient Instructions (Addendum)
Today we updated your med list in our EPIC system...    Continue your current medications the same...  We wrote an additional prescription for [facility] 750mg  to take one per day for 10 days to keep on hand for a respiratory exacerbation (as we discussed)...  Call for any questions...  Let's plan a follow up visit in 6 months but call any time if needed for problems.Marland KitchenMarland Kitchen

## 2012-05-02 NOTE — Progress Notes (Signed)
Patient ID: Miranda Thompson, female   DOB: 26-Apr-1933, 79 y.o.   MRN: [id number]  Subjective:    Patient ID: Miranda Thompson, female    DOB: 08-Jul-1933, 79 y.o.   MRN: [id number]  HPI 79 y/o WF here for a follow up visit... she has mult med problems as noted below...   ~  May 05, 2010:  24mo ROV & presents w/ a "rough 3-4 weeks"> abd pain LLQ & across lower abd, no ch in bowels & no blood seen, went to see GYN DrRomaine & Sonar showed left colon swollen, given Cipro 2/22-29 but still febrile, still in pain so DrRomaine gave her another round of Cipro 3/7==> ;  exam shows LLQ tender w/o guarding or rebound & all c/w sigm diverticulitis >> suggested CT Abd & adding FLAGYL + low residue diet to the regimen, ?f/u w/ GI pending CT results.    She has mod severe underlying bronchiectasis & granulomatous lung dis> stable on her regimen of Advair, Mucinex, chestPT/ post drainage;  due for her yearly f/u CXR today = chr pulm scarring, NAD;  Labs showed Chems OK x Na 131, CBC OK w/ Hg 12.8, WBC 7.2, Sed 83...     She had an interval BMD 10/11 at [facility] showing TScores +0.3 in Spine, -0.8 in left FemNeck, & -2.1 right forearm (improved in spine but worse in hip & forearm);  treated by DrRomaine on Calcium, MVI, Vit D 1000, and numerous herbs...  ~  November 10, 2010:  24mo ROV & she continues stable overall; she had to cancel her planned trip to [place] when her niece passed away w/ melanoma;  Prev bout of diverticulitis resolved w/ cipro & Flagyl and back to baseline;  She continues her daily postural drain w/ copious sputum production (no recent cultures) & he same bronchodilator regimen; no new complaints or concerns...  ~  May 11, 2011:  24mo ROV & Miranda Thompson notes some incr fatigue, dry cough, right sided chest discomfort, & pulse in the 90's;  She takes her Advair250, GFN, & does her chest treatments regularly; she also takes a number of herbs that she believes are beneficial;  She had f/u OV w/ DrPatterson  11/12 after another bout of diverticulitis in Oct... See prob list below>> CXR 3/13 showed essentially no change> normal heart size, prom pulm arts, bilat interstitial prominence & nodularity, bronchiectasis, NAD, surg clips from prev left breast ca surg. LABS 3/13:  FLP- at goals on Diet;  Chems- wnl;  CBC- wnl;  TSH=3.63;  VitD=35.  ~  November 02, 2011:  24mo ROV> Miranda Thompson called for early add-on visit due to recent incr in cough, chest congestion, thick discolored mucus w/ hemoptysis; she had temp at home to 103 she says & brought this down w/ Tylenol; she has assoc fatigue, SOB, chest pain, & headache; she has a long hx of known bronchiectasis, granulomatous lung dis, & recurrent pneumonias; CXR today shows more consolidated left mid zone opacity (her worst area of bronchiectasis); her only home meds are ADVAIR250Bid & Mucinex, she takes a lot of herbal supplements... We decided to treat this exac w/ LEVAQUIN x10d... LABS 9/13:  FLP (ok x LDL=108);  BMET (wnl);  Sed (elev at 19)  SPUTUM C&S:  PSEUDOMONAS resist to Cipro/ Levaquin/ Maxipeme; sens to Fortaz/ Zosyn/ Imipenem; NOTE> she is already improved after finishing empiric Levaquin & we will continue to follow...  ~  May 02, 2012:  24mo ROV & Miranda Thompson has been stable w/  her bronchiectasis & regular cough, sputum, intermittent hemoptysis, etc... She does a great job w/ chestPT, postural drainage, and takes Mucinex 1200Bid, Fluids, & Advair250... Last sputum 1013 grew Pseudomonas resistant to Cipro/ Levaquin/ Cefipeme/ Genta (we will re-culture to check current sens panel)... She reports recent Cipro x2 for UTIs, breathing is fair, walking some but decr activ due to arthritis (knees hips) attended by DrFields w/ shots given & braces to use (she notes that cherry juice really helps)...     We reviewed prob list, meds, xrays and labs> see below for updates >> she wants Levaquin 750mg /d x10d for prn use... LABS 3/14:  FLP- at goals on diet alone;  Chems-  wnl;  CBC- wnl;  TSH=2.37;  VitD=31;  UA- clear... SPUTUM 4/14 >> Pseudomonas sensitive to all antibiotics    Problem List:     ALLERGIC RHINITIS (ICD-477.9) - she uses OTC antihistamines Prn.  BRONCHIECTASIS (ICD-494.0) & Hx of PNEUMONIA (ICD-486) - on ADVAIR 250 Bid, & MUCINEX 1-2Bid...  Long hx severe bronchiectasis, granulomatous lung dis, and pneumonia (initially Dx 1968 by DrFrazier, subseq bx= noncaseating gran dz & chr inflam)... her last bronchoscopy was 4/04 revealing some mucus plugging... CTChest 4/04 showed marked bronchiectasis in lingular, LLL, RML, and mucus plugging... CT repeated 6/06- similar. ~  Sputum cultures 5/08 grew Serratia (R to amox/ceph, S to all others), branching part acid fast bacilli resembling nocardia, yeast c/w candida... ~  CXR 1/09 chr lung dis, no change from prev w/ left mid lung zone as the worst area... ~  CXR 3/10 w/ chr lung changes and NAD suspected... ~  Sputum 8/10 showed NTF only & AFB smears & cult all neg... ~  CXR 3/11 showed chr lung dis, no acute changes, stable... ~  CXR 3/12 showed chronic changes, scarring, NAD.Marland Kitchen. ~  CXR 3/13 showed essentially no change> normal heart size, prom pulm arts, bilat interstitial prominence & nodularity, bronchiectasis, NAD, surg clips from prev left breast ca surg. ~  9/13: she presents w/ an acute exac w/ cough, thick sput, congestion, fever, SOB, & chest discomfort; CXR shows worse left mid zone opacity; We decided to treat w/ empiric Levaquin x10d pending cult report==> ret w/ resist Pseudomonas, but pt states better/ improved on the Levaquin.  HYPERCHOLESTEROLEMIA, BORDERLINE (ICD-272.4) - on diet alone... ~  FLP 9/10 showed TChol 174, TG 64, HDL 49, LDL 112 ~  FLP 9/11 showed TChol 175, TG 61, HDL 48, LDL 115 ~  FLP 3/13 showed TChol 176, TG 69, HDL 64, LDL 98 ~  FLP 9/13 showed TChol 169, TG 65, HDL 48, LDL 108  Hx of ADENOCARCINOMA, BREAST (ICD-174.9) - S/P surgery 5/94 [facility] w/ wide excision L  breast lesion and LN dissection (stage II 1.0  cm tumor), w/ post op XRT and chemotherapy... she was followed by [facility] for oncology... then DrLivesay (released in 2003). ~  routine Mammogram 2/10 was neg- f/u planned 48yr. ~  f/u mammogram 2/11 = neg, NAD, f/u planned 55yr. ~  f/u mammogram 2/12 = neg, NAD, f/u 64yr. ~  f/u mammogram 2/13 = neg, NAD...  DIVERTICULOSIS OF COLON (ICD-562.10) - colonoscopy 3/05 by DrPatterson showed divertics only... she has +fam hx colon ca w/ colon check q79yrs... f/u colonoscopy 5/11 showed +divertics, no polyps... ~  3/12:  Hx, exam & CTAbd c/w diverticulitis; treated w/ Cipro/ Flagyl & resolved;  Advised Metamucil, Miralax, etc. ~  11/12:  She had f/u DrPatterson after another bout of diverticulitis; he notes regular BMs, good  appetite, stable weight; he wanted her to STOP Naprosyn, ok to use Tylenol/ Tramadol, rec hi fiber, saw the Divetic movie.  DEGENERATIVE JOINT DISEASE (ICD-715.90) - see Ortho eval 2010 by DrDaldorf... she takes Glucosamine & Herbal supplements- milk thistle/ tumeric which she says really helps...  LOW BACK PAIN SYNDROME (ICD-724.2) - LBP and eval by [facility] 2010 w/ MRI showed herniated disc L3-4 w/ mod spinal stenosis and lat recess stenosis bilat...   ROTATOR CUFF SYNDROME, RIGHT (ICD-726.10) - had injection by [facility] 10/07 w/ improvement.  OSTEOPENIA (ICD-733.90) - last BMD at [facility] 10/06 showed norm spine, norm hip, osteopenic forearm (-2.0)... followed by GYN = DrRomaine who is treating a low Vit D level... she takes calcium, Vitamins, & Vit D 1000u daily from [place]... ~  labs 9/10 on 50K weekly showed Vit D level = 42... rec> change to 1000 u daily. ~  labs 9/11 on 1000 u daily showed Vit D level = 34... rec> continue daily supplement. ~  Labs 3/13 showed Vit D level = 35... Continue supplements.  Hx of ANEMIA (ICD-285.9) -  resolved... ~  labs 9/10 showed Hg= 13.6 ~  labs 9/11 showed Hg= 13.2 ~  Labs 3/13  showed Hg= 13.4  ECZEMA - she has hx of eczema on her hands and has used CoQ10 and Borage Oil as rec by an Dentist in [place]... recently changed to [facility] Current instead of these other supplements & rash is much improved...   Past Surgical History  Procedure Laterality Date  . Lung biopsy  1968    TSurg---Dr. Bascom Levels  . Left breast lumpectomy and ln dissection  06/1992    Dr. Ezzard Standing    Outpatient Encounter Prescriptions as of 05/02/2012  Medication Sig Dispense Refill  . acetaminophen (TYLENOL) 325 MG tablet Take 650 mg by mouth every 6 (six) hours as needed.        Marland Kitchen ADVAIR DISKUS 250-50 MCG/DOSE AEPB INHALE 1 PUFF TWICE A DAY  60 each  11  . aspirin 81 MG tablet Take 81 mg by mouth daily.        Marland Kitchen BLACK CURRANT SEED OIL PO Take 1 tablet by mouth daily.        . calcium carbonate 200 MG capsule Take 250 mg by mouth 2 (two) times daily with a meal.      . estradiol (VAGIFEM) 25 MCG vaginal tablet Use 2 times per week       . glucosamine-chondroitin 500-400 MG tablet Take 1 tablet by mouth daily.        . Multiple Vitamins-Minerals (MULTIVITAMIN & MINERAL PO) Take 1 tablet by mouth daily.        Marland Kitchen OVER THE COUNTER MEDICATION Concentrated tart cherry juice--2 tablespoons every day      . OVER THE COUNTER MEDICATION Calcium 1000mg  every day      . Probiotic Product (ALIGN PO) Take by mouth.      . Pseudoephedrine-Guaifenesin (MUCINEX D) [phone] MG TB12 Take 1 tablet by mouth daily.        . traMADol (ULTRAM) 50 MG tablet TAKE 1 TABLET BY MOUTH EVERY 6 HOURS AS NEEDED FOR PAIN.  90 tablet  2  . Wheat Dextrin (BENEFIBER PO) Take by mouth. Uses store brand       No facility-administered encounter medications on file as of 05/02/2012.    No Known Allergies   Current Medications, Allergies, Past Medical History, Past Surgical History, Family History, and Social History were reviewed in [employer]  medical record.     Review of Systems        See HPI - all other systems  neg except as noted...  The patient complains of dyspnea on exertion and abdominal pain.  The patient denies anorexia, fever, weight loss, weight gain, vision loss, decreased hearing, hoarseness, chest pain, syncope, peripheral edema, prolonged cough, headaches, hemoptysis, melena, hematochezia, severe indigestion/heartburn, hematuria, incontinence, muscle weakness, suspicious skin lesions, transient blindness, difficulty walking, depression, unusual weight change, abnormal bleeding, enlarged lymph nodes, and angioedema.     Objective:   Physical Exam    WD, WN, 79 y/o WF in NAD... GENERAL:  Alert & oriented; pleasant & cooperative... HEENT:  [place]/AT, EOM-wnl, PERRLA, EACs-clear, TMs-wnl, NOSE-clear, THROAT-clear & wnl. NECK:  Supple w/ fairROM; no JVD; normal carotid impulses w/o bruits; no thyromegaly or nodules palpated; no lymphadenopathy. CHEST:  scat bilat rhonchi without wheezing, rales or signs of consolidation... HEART:  Regular Rhythm; without murmurs/ rubs/ or gallops heard... ABDOMEN:  Soft w/ mild tender LLQ; normal bowel sounds; no organomegaly or masses detected. EXT: without deformities or arthritic changes; no varicose veins/ venous insuffic/ or edema. NEURO:  CN's intact;  no focal neuro deficits... DERM:  No lesions noted; no rash etc...  RADIOLOGY DATA:  Reviewed in the EPIC EMR & discussed w/ the patient...  LABORATORY DATA:  Reviewed in the EPIC EMR & discussed w/ the patient...    Assessment & Plan:    COPD/ Bronchiectasis/ Hx pneumonia>  On Advair250, Mucinex2Bid, Fluids & vigorous Chest PT/ postural drainage; given sputum specimen cups for sterile collection for C&S; 9/13> treating acute exac w/ Levaquin x10d; Cult grew a resist Pseudomonas but she reports clinically improved, therefore continue present Rx & follow...  CHOL>  On diet alone & f/u FLP is at goal...  Breast Cancer>  S/p surg by [facility] in 94 & oncology f/u by DrNeijstrom, then [facility] 2003 &  released; yearly mammograms & monthly self exams were wnl...  Divertics>  She had acute diverticulitis 3/12 & 10/12> resolved w/ diet adjust & Cipro/Flagyl, now back to baseline & advised re- Metamucil, Fiberall, etc; she saw DrPatterson 11/12.  DJD/ LBP>  Ortho is DrDaldorf & she takes Glucosamine & Herbs and doing satis she says...  Osteopenia/ Vit D defic>  On Calcium, MVI, Vit D supplement; followed by GYN= DrRomaine...  Other medical issues as noted...    Patient's Medications  New Prescriptions   AMOXICILLIN-CLAVULANATE (AUGMENTIN) 875-125 MG PER TABLET    Take 1 tablet by mouth 2 (two) times daily.   CIPROFLOXACIN (CIPRO) 500 MG TABLET    Take 1 tablet (500 mg total) by mouth 2 (two) times daily.   LEVOFLOXACIN (LEVAQUIN) 750 MG TABLET    Take 1 tablet (750 mg total) by mouth daily. As directed  Previous Medications   ACETAMINOPHEN (TYLENOL) 325 MG TABLET    Take 650 mg by mouth every 6 (six) hours as needed.     ADVAIR DISKUS 250-50 MCG/DOSE AEPB    INHALE 1 PUFF TWICE A DAY   ASPIRIN 81 MG TABLET    Take 81 mg by mouth daily.     BLACK CURRANT SEED OIL PO    Take 1 tablet by mouth daily.     CALCIUM CARBONATE 200 MG CAPSULE    Take 250 mg by mouth 2 (two) times daily with a meal.   ESTRADIOL (VAGIFEM) 25 MCG VAGINAL TABLET    Use 2 times per week    GLUCOSAMINE-CHONDROITIN 500-400 MG TABLET  Take 1 tablet by mouth daily.     MULTIPLE VITAMINS-MINERALS (MULTIVITAMIN & MINERAL PO)    Take 1 tablet by mouth daily.     OVER THE COUNTER MEDICATION    Concentrated tart cherry juice--2 tablespoons every day   OVER THE COUNTER MEDICATION    Calcium 1000mg  every day   PROBIOTIC PRODUCT (ALIGN PO)    Take by mouth.   PSEUDOEPHEDRINE-GUAIFENESIN (MUCINEX D) [phone] MG TB12    Take 1 tablet by mouth daily.     TRAMADOL (ULTRAM) 50 MG TABLET    TAKE 1 TABLET BY MOUTH EVERY 6 HOURS AS NEEDED FOR PAIN.   WHEAT DEXTRIN (BENEFIBER PO)    Take by mouth. Uses store brand  Modified Medications    No medications on file  Discontinued Medications   No medications on file

## 2012-05-10 ENCOUNTER — Encounter: Payer: Self-pay | Admitting: Pulmonary Disease

## 2012-05-11 ENCOUNTER — Other Ambulatory Visit: Payer: Self-pay | Admitting: Pulmonary Disease

## 2012-05-11 DIAGNOSIS — N390 Urinary tract infection, site not specified: Secondary | ICD-10-CM

## 2012-05-30 ENCOUNTER — Encounter: Payer: Self-pay | Admitting: Pulmonary Disease

## 2012-05-31 NOTE — Telephone Encounter (Signed)
Dr Kriste Basque, the patient is requesting to know if it would benefit her to go ahead and have a Tdap done.   Please advise, thanks!

## 2012-06-06 ENCOUNTER — Other Ambulatory Visit: Payer: Medicare Other

## 2012-06-06 ENCOUNTER — Telehealth: Payer: Self-pay | Admitting: Pulmonary Disease

## 2012-06-06 DIAGNOSIS — R042 Hemoptysis: Secondary | ICD-10-CM

## 2012-06-06 MED ORDER — AMOXICILLIN-POT CLAVULANATE 875-125 MG PO TABS: 1.0000 | ORAL_TABLET | Freq: Two times a day (BID) | ORAL | Status: DC

## 2012-06-06 NOTE — Telephone Encounter (Addendum)
Last OV 05-02-12. Spoke with pt and she states she was doing her postural drainage as directed and coughed up bright blood. She states she had a sterile cup on hand so she coughed into this and has the sample if needed. Pt denies any other complaints at this time. She wanted to know would SN want a culture on the sample? Please advise.  Carron Curie, CMA

## 2012-06-06 NOTE — Telephone Encounter (Signed)
Per SN----call in augmentin 875 mg  #20 1 po bid x 10 days Align once daily  Yes ok to bring in sputum in for culture.   Last culture was 11/2011 was a resist pseudomono.

## 2012-06-06 NOTE — Telephone Encounter (Signed)
Rx sent and culture ordered. Carron Curie, CMA

## 2012-06-07 ENCOUNTER — Other Ambulatory Visit: Payer: Medicare Other

## 2012-06-07 ENCOUNTER — Other Ambulatory Visit: Payer: Self-pay | Admitting: Pulmonary Disease

## 2012-06-07 DIAGNOSIS — J189 Pneumonia, unspecified organism: Secondary | ICD-10-CM

## 2012-06-07 DIAGNOSIS — J479 Bronchiectasis, uncomplicated: Secondary | ICD-10-CM

## 2012-06-10 LAB — RESPIRATORY CULTURE OR RESPIRATORY AND SPUTUM CULTURE

## 2012-06-14 ENCOUNTER — Telehealth: Payer: Self-pay | Admitting: *Deleted

## 2012-06-14 ENCOUNTER — Encounter: Payer: Self-pay | Admitting: Pulmonary Disease

## 2012-06-14 ENCOUNTER — Encounter: Payer: Self-pay | Admitting: Sports Medicine

## 2012-06-14 ENCOUNTER — Ambulatory Visit (INDEPENDENT_AMBULATORY_CARE_PROVIDER_SITE_OTHER): Payer: Medicare Other | Admitting: Sports Medicine

## 2012-06-14 VITALS — BP 154/74 | HR 94 | Ht 64.0 in | Wt 132.0 lb

## 2012-06-14 DIAGNOSIS — M25569 Pain in unspecified knee: Secondary | ICD-10-CM

## 2012-06-14 DIAGNOSIS — M25561 Pain in right knee: Secondary | ICD-10-CM

## 2012-06-14 NOTE — Telephone Encounter (Signed)
lmomtcb for the pt to review her sputum culture results.    Per SN---shows pseudomonas---  Recs for 10 day course of cipro 500 mg  1 po bid #20 and take align once daily while on the abx.

## 2012-06-14 NOTE — Patient Instructions (Addendum)
Take tramadol twice daily until rt knee pain improves, ok to   Continue using compression sleeves  You may want to try devil's claw if you are not getting much improvement  Other supplements that might help are:   Tumeric Curcumin   Please follow up as needed  Thank you for seeing [place] today!

## 2012-06-14 NOTE — Assessment & Plan Note (Signed)
She has had an excellent response to compression sleeves and tramadol  Since the right knee is flaring up again I think she should go back on tramadol  She can continue using alternative medicines  She can recheck with me in 3-4 months less pain becomes more severe

## 2012-06-14 NOTE — Progress Notes (Signed)
  Subjective:    Patient ID: Miranda Thompson, female    DOB: 1933-03-16, 79 y.o.   MRN: [id number]  HPI  Pt presents to clinic for f/u of bilat knee pain due to arthritis. Left knee feeling much better, rt knee still having more pain with walking up hills, stairs or increased walking in general.  She is using body helix knee sleeves on both knees, which have been helpful. Has not needed to take tramadol recently. Taking tart red cherry oil daily. Right knee has given out a few times.   She is not using much tramadol but this did lessen her pain when she needed to use it  Review of Systems     Objective:   Physical Exam No acute distress  Very limited motion of lt hip, rt hip better Crepitation bilat on flexion and extension of knees Rt knee- gets pain at last 30 deg of extension- but able to get full extension bilat Pain on ant med joint line on rt Hip flexion strong bilat Hip abduction strength good bilat Quad strength good bilat     Assessment & Plan:

## 2012-06-15 NOTE — Telephone Encounter (Signed)
Attempted to call pt back and no machine and no answer.  Will call back later.

## 2012-06-15 NOTE — Telephone Encounter (Signed)
Pt returned Leigh's call.  Holly D Pryor ° °

## 2012-06-16 ENCOUNTER — Encounter: Payer: Self-pay | Admitting: Pulmonary Disease

## 2012-06-16 NOTE — Telephone Encounter (Signed)
Nothing further is needed at this time.  

## 2012-06-20 MED ORDER — CIPROFLOXACIN HCL 500 MG PO TABS: 500.0000 mg | ORAL_TABLET | Freq: Two times a day (BID) | ORAL | Status: DC

## 2012-06-20 NOTE — Telephone Encounter (Signed)
Called and spoke with pt and she is aware of lab results per SN and is aware that the cipro has been sent in to the pharmacy and pt is aware to take the align once daily while on the abx.  Nothing further is needed.

## 2012-06-24 ENCOUNTER — Encounter: Payer: Self-pay | Admitting: Pulmonary Disease

## 2012-08-17 ENCOUNTER — Other Ambulatory Visit: Payer: Self-pay | Admitting: *Deleted

## 2012-08-17 MED ORDER — ESTRADIOL 10 MCG VA TABS: 1.0000 | ORAL_TABLET | VAGINAL | Status: DC

## 2012-08-17 NOTE — Telephone Encounter (Signed)
aex was on 01/27/2012. next aex on 12/5//2014. note from aex in 2013 to refill when pt requests. Rx for Vagifem (vaginal tabs) 2x wkly #8/6 RF's sent to pharmacy. pt requested refill via fax request.

## 2012-08-18 ENCOUNTER — Other Ambulatory Visit: Payer: Self-pay | Admitting: *Deleted

## 2012-08-18 MED ORDER — ESTRADIOL 10 MCG VA TABS: 1.0000 | ORAL_TABLET | VAGINAL | Status: DC

## 2012-11-02 ENCOUNTER — Ambulatory Visit: Payer: Medicare Other | Admitting: Pulmonary Disease

## 2012-11-10 ENCOUNTER — Encounter: Payer: Self-pay | Admitting: Pulmonary Disease

## 2012-11-10 ENCOUNTER — Other Ambulatory Visit (INDEPENDENT_AMBULATORY_CARE_PROVIDER_SITE_OTHER): Payer: Medicare Other

## 2012-11-10 ENCOUNTER — Ambulatory Visit (INDEPENDENT_AMBULATORY_CARE_PROVIDER_SITE_OTHER): Payer: Medicare Other | Admitting: Pulmonary Disease

## 2012-11-10 VITALS — BP 134/72 | HR 79 | Temp 97.5°F | Ht 64.0 in | Wt 133.0 lb

## 2012-11-10 DIAGNOSIS — R042 Hemoptysis: Secondary | ICD-10-CM

## 2012-11-10 DIAGNOSIS — M25561 Pain in right knee: Secondary | ICD-10-CM

## 2012-11-10 DIAGNOSIS — D649 Anemia, unspecified: Secondary | ICD-10-CM

## 2012-11-10 DIAGNOSIS — J479 Bronchiectasis, uncomplicated: Secondary | ICD-10-CM

## 2012-11-10 DIAGNOSIS — E785 Hyperlipidemia, unspecified: Secondary | ICD-10-CM

## 2012-11-10 DIAGNOSIS — M545 Low back pain, unspecified: Secondary | ICD-10-CM

## 2012-11-10 DIAGNOSIS — M1990 Unspecified osteoarthritis, unspecified site: Secondary | ICD-10-CM

## 2012-11-10 DIAGNOSIS — K5730 Diverticulosis of large intestine without perforation or abscess without bleeding: Secondary | ICD-10-CM

## 2012-11-10 DIAGNOSIS — C50919 Malignant neoplasm of unspecified site of unspecified female breast: Secondary | ICD-10-CM

## 2012-11-10 DIAGNOSIS — M899 Disorder of bone, unspecified: Secondary | ICD-10-CM

## 2012-11-10 DIAGNOSIS — Z23 Encounter for immunization: Secondary | ICD-10-CM

## 2012-11-10 LAB — CBC WITH DIFFERENTIAL/PLATELET
Basophils Absolute: 0 10*3/uL (ref 0.0–0.1)
Basophils Relative: 0.5 % (ref 0.0–3.0)
Eosinophils Absolute: 0.1 10*3/uL (ref 0.0–0.7)
Eosinophils Relative: 1.8 % (ref 0.0–5.0)
HCT: 40.3 % (ref 36.0–46.0)
Hemoglobin: 13.7 g/dL (ref 12.0–15.0)
Lymphocytes Relative: 21.2 % (ref 12.0–46.0)
Lymphs Abs: 1.3 10*3/uL (ref 0.7–4.0)
MCHC: 34.2 g/dL (ref 30.0–36.0)
MCV: 95.4 fl (ref 78.0–100.0)
Monocytes Absolute: 0.5 10*3/uL (ref 0.1–1.0)
Monocytes Relative: 8.1 % (ref 3.0–12.0)
Neutro Abs: 4.4 10*3/uL (ref 1.4–7.7)
Neutrophils Relative %: 68.4 % (ref 43.0–77.0)
Platelets: 220 10*3/uL (ref 150.0–400.0)
RBC: 4.22 Mil/uL (ref 3.87–5.11)
RDW: 13.1 % (ref 11.5–14.6)
WBC: 6.4 10*3/uL (ref 4.5–10.5)

## 2012-11-10 LAB — IBC PANEL
Iron: 91 ug/dL (ref 42–145)
Saturation Ratios: 24.8 % (ref 20.0–50.0)
Transferrin: 261.6 mg/dL (ref 212.0–360.0)

## 2012-11-10 NOTE — Patient Instructions (Addendum)
Today we updated your med list in our EPIC system...    Continue your current medications the same...  Today we rechecked your blood count & iron level...    We will contact you w/ the results when available...   Keep up the good work w/ your chest PT & postural draining...  We gave you the 2014 FLU vaccine today...  Call for any questions...  Let's plan a follow up visit in 1mo w/ fasting blood work & a CXR.Marland KitchenMarland Kitchen

## 2012-11-10 NOTE — Progress Notes (Signed)
Patient ID: Miranda Thompson, female   DOB: [birth year], 79 y.o.   MRN: [id number]  Subjective:    Patient ID: Miranda Thompson, female    DOB: 05/13/1933, 79 y.o.   MRN: [id number]  HPI 79 y/o WF here for a follow up visit... she has mult med problems as noted below...   ~  May 05, 2010:  65mo ROV & presents w/ a "rough 3-4 weeks"> abd pain LLQ & across lower abd, no ch in bowels & no blood seen, went to see GYN DrRomaine & Sonar showed left colon swollen, given Cipro 2/22-29 but still febrile, still in pain so DrRomaine gave her another round of Cipro 3/7==> ;  exam shows LLQ tender w/o guarding or rebound & all c/w sigm diverticulitis >> suggested CT Abd & adding FLAGYL + low residue diet to the regimen, ?f/u w/ GI pending CT results.    She has mod severe underlying bronchiectasis & granulomatous lung dis> stable on her regimen of Advair, Mucinex, chestPT/ post drainage;  due for her yearly f/u CXR today = chr pulm scarring, NAD;  Labs showed Chems OK x Na 131, CBC OK w/ Hg 12.8, WBC 7.2, Sed 83...     She had an interval BMD 10/11 at [facility] showing TScores +0.3 in Spine, -0.8 in left FemNeck, & -2.1 right forearm (improved in spine but worse in hip & forearm);  treated by DrRomaine on Calcium, MVI, Vit D 1000, and numerous herbs...  ~  November 10, 2010:  65mo ROV & she continues stable overall; she had to cancel her planned trip to [place] when her niece passed away w/ melanoma;  Prev bout of diverticulitis resolved w/ cipro & Flagyl and back to baseline;  She continues her daily postural drain w/ copious sputum production (no recent cultures) & he same bronchodilator regimen; no new complaints or concerns...  ~  May 11, 2011:  65mo ROV & Linn notes some incr fatigue, dry cough, right sided chest discomfort, & pulse in the 90's;  She takes her Advair250, GFN, & does her chest treatments regularly; she also takes a number of herbs that she believes are beneficial;  She had f/u OV w/ DrPatterson  11/12 after another bout of diverticulitis in Oct... See prob list below>> CXR 3/13 showed essentially no change> normal heart size, prom pulm arts, bilat interstitial prominence & nodularity, bronchiectasis, NAD, surg clips from prev left breast ca surg. LABS 3/13:  FLP- at goals on Diet;  Chems- wnl;  CBC- wnl;  TSH=3.63;  VitD=35.  ~  November 02, 2011:  65mo ROV> Lorita called for early add-on visit due to recent incr in cough, chest congestion, thick discolored mucus w/ hemoptysis; she had temp at home to 103 she says & brought this down w/ Tylenol; she has assoc fatigue, SOB, chest pain, & headache; she has a long hx of known bronchiectasis, granulomatous lung dis, & recurrent pneumonias; CXR today shows more consolidated left mid zone opacity (her worst area of bronchiectasis); her only home meds are ADVAIR250Bid & Mucinex, she takes a lot of herbal supplements... We decided to treat this exac w/ LEVAQUIN x10d... LABS 9/13:  FLP (ok x LDL=108);  BMET (wnl);  Sed (elev at 20)  SPUTUM C&S:  PSEUDOMONAS resist to Cipro/ Levaquin/ Maxipeme; sens to Fortaz/ Zosyn/ Imipenem; NOTE> she is already improved after finishing empiric Levaquin & we will continue to follow...  ~  May 02, 2012:  65mo ROV & Tishina has been stable w/  her bronchiectasis & regular cough, sputum, intermittent hemoptysis, etc... She does a great job w/ chestPT, postural drainage, and takes Mucinex 1200Bid, Fluids, & Advair250... Last sputum 1013 grew Pseudomonas resistant to Cipro/ Levaquin/ Cefipeme/ Genta (we will re-culture to check current sens panel)... She reports recent Cipro x2 for UTIs, breathing is fair, walking some but decr activ due to arthritis (knees hips) attended by DrFields w/ shots given & braces to use (she notes that cherry juice really helps)...     We reviewed prob list, meds, xrays and labs> see below for updates >> she wants Levaquin 750mg /d x10d for prn use... LABS 3/14:  FLP- at goals on diet alone;  Chems-  wnl;  CBC- wnl;  TSH=2.37;  VitD=31;  UA- clear... SPUTUM 4/14 >> Pseudomonas sensitive to all antibiotics   ~  November 10, 2012:  40mo ROV & Jeliyah is about the same- feels tired all the time, chr cough w/ occas hemoptysis (streaky phlegm), no bad episodes; she has known Bronchiectasis on Advair 250Bid & Mucinex600-2Bid w/ fluids; tried on cycic antibiotics in the past but she never felt they really helped & she prefers Prn Cipro/ Levaquin for acute superimposed infectious exacerbations; Last CXR 9/13 showed scarring LUL, chr changes left mid-lung, s/p left mastectomy & axillary node dissection, etc;  Last Sputum C&S 4/14- Pseudomonas sens to Cefepime, Fortaz, Cipro, Levaquin, etc;  She does chest PT & postural drainage daily... She takes a number of supplements including Organic apple cider vinegar daily...    We reviewed prob list, meds, xrays and labs> see below for updates >> OK 2014 Flu vaccine... LABS 9/14:  CBC- ok w/ Hg=13.7, WBC=6.4;  Fe=91 (25%sat)... BMet wasn't done...     Problem List:     ALLERGIC RHINITIS (ICD-477.9) - she uses OTC antihistamines Prn.  BRONCHIECTASIS (ICD-494.0) & Hx of PNEUMONIA (ICD-486) - on ADVAIR 250 Bid, & MUCINEX 1-2Bid...  Long hx severe bronchiectasis, granulomatous lung dis, and pneumonia (initially Dx 1968 by DrFrazier, subseq bx= noncaseating gran dz & chr inflam)... her last bronchoscopy was 4/04 revealing some mucus plugging... CTChest 4/04 showed marked bronchiectasis in lingular, LLL, RML, and mucus plugging... CT repeated 6/06- similar. ~  Sputum cultures 5/08 grew Serratia (R to amox/ceph, S to all others), branching part acid fast bacilli resembling nocardia, yeast c/w candida... ~  CXR 1/09 chr lung dis, no change from prev w/ left mid lung zone as the worst area... ~  CXR 3/10 w/ chr lung changes and NAD suspected... ~  Sputum 8/10 showed NTF only & AFB smears & cult all neg... ~  CXR 3/11 showed chr lung dis, no acute changes, stable... ~   CXR 3/12 showed chronic changes, scarring, NAD.Marland Kitchen. ~  CXR 3/13 showed essentially no change> normal heart size, prom pulm arts, bilat interstitial prominence & nodularity, bronchiectasis, NAD, surg clips from prev left breast ca surg. ~  9/13: she presents w/ an acute exac w/ cough, thick sput, congestion, fever, SOB, & chest discomfort; CXR shows worse left mid zone opacity; We decided to treat w/ empiric Levaquin x10d pending cult report==> ret w/ resist Pseudomonas, but pt states better/ improved on the Levaquin. ~  Sputum C&S 4/14 showed Pseudomonas sens to Cefepime, Fortaz, Cipro, Levaquin, etc  HYPERCHOLESTEROLEMIA, BORDERLINE (ICD-272.4) - on diet alone... ~  FLP 9/10 showed TChol 174, TG 64, HDL 49, LDL 112 ~  FLP 9/11 showed TChol 175, TG 61, HDL 48, LDL 115 ~  FLP 3/13 showed TChol 176, TG 69, HDL  64, LDL 98 ~  FLP 9/13 showed TChol 169, TG 65, HDL 48, LDL 108 ~  FLP 3/14 showed TChol 157, TG 54, HDL 46, LDL 100  Hx of ADENOCARCINOMA, BREAST (ICD-174.9) - S/P surgery 5/94 [facility] w/ wide excision L breast lesion and LN dissection (stage II 1.0  cm tumor), w/ post op XRT and chemotherapy... she was followed by [facility] for oncology... then DrLivesay (released in 2003). ~  routine Mammogram 2/10 was neg- f/u planned 67yr. ~  f/u mammogram 2/11 = neg, NAD, f/u planned 39yr. ~  f/u mammogram 2/12 = neg, NAD, f/u 36yr. ~  f/u mammogram 2/13 = neg, NAD.Marland Kitchen. ~  f/u mammogram 2/14 = neg, scat parenchymal densities...  DIVERTICULOSIS OF COLON (ICD-562.10) - colonoscopy 3/05 by DrPatterson showed divertics only... she has +fam hx colon ca w/ colon check q73yrs... f/u colonoscopy 5/11 showed +divertics, no polyps... ~  3/12:  Hx, exam & CTAbd c/w diverticulitis; treated w/ Cipro/ Flagyl & resolved;  Advised Metamucil, Miralax, etc. ~  11/12:  She had f/u DrPatterson after another bout of diverticulitis; he notes regular BMs, good appetite, stable weight; he wanted her to STOP Naprosyn, ok to use  Tylenol/ Tramadol, rec hi fiber, saw the Divetic movie.  DEGENERATIVE JOINT DISEASE (ICD-715.90) - see Ortho eval 2010 by DrDaldorf... she takes Glucosamine & Herbal supplements- milk thistle/ tumeric which she says really helps... 4/14: she saw DrKarlFields for SportsMed> c/o bilat knee pain, known arthritis, uses body helix knee (compression) sleeves & Tramadol prn; takes tart red cherry oil daily & he rec devil's claw, tumeric, & curcumin...  LOW BACK PAIN SYNDROME (ICD-724.2) - LBP and eval by [facility] w/ MRI showed herniated disc L3-4 w/ mod spinal stenosis and lat recess stenosis bilat...   ROTATOR CUFF SYNDROME, RIGHT (ICD-726.10) - had injection by [facility] 10/07 w/ improvement.  OSTEOPENIA (ICD-733.90) - last BMD at [facility] 10/06 showed norm spine, norm hip, osteopenic forearm (-2.0)... followed by GYN = DrRomaine who is treating a low Vit D level... she takes calcium, Vitamins, & Vit D 1000u daily from [place]... ~  labs 9/10 on 50K weekly showed Vit D level = 42... rec> change to 1000 u daily. ~  labs 9/11 on 1000 u daily showed Vit D level = 34... rec> continue daily supplement. ~  Labs 3/13 showed Vit D level = 35... Continue supplements. ~  Labs 3/14 showed Vit D level = 31  Hx of ANEMIA (ICD-285.9) -  resolved... ~  labs 9/10 showed Hg= 13.6 ~  labs 9/11 showed Hg= 13.2 ~  Labs 3/13 showed Hg= 13.4  ECZEMA - she has hx of eczema on her hands and has used CoQ10 and Borage Oil as rec by an Dentist in [place]... recently changed to [facility] Current instead of these other supplements & rash is much improved...   Past Surgical History  Procedure Laterality Date  . Lung biopsy  1968    TSurg---Dr. Bascom Levels  . Left breast lumpectomy and ln dissection  06/1992    Dr. Ezzard Standing    Outpatient Encounter Prescriptions as of 11/10/2012  Medication Sig Dispense Refill  . acetaminophen (TYLENOL) 500 MG tablet Take 500 mg by mouth every 6 (six) hours as needed for pain.      Marland Kitchen  ADVAIR DISKUS 250-50 MCG/DOSE AEPB INHALE 1 PUFF TWICE A DAY  60 each  11  . aspirin 81 MG tablet Take 81 mg by mouth daily.        Marland Kitchen BLACK CURRANT  SEED OIL PO Take 1 tablet by mouth daily.        . calcium carbonate 200 MG capsule Take 250 mg by mouth 2 (two) times daily with a meal.      . Estradiol 10 MCG TABS vaginal tablet Place 1 tablet (10 mcg total) vaginally 2 (two) times a week.  8 tablet  6  . glucosamine-chondroitin 500-400 MG tablet Take 1 tablet by mouth daily.        . Multiple Vitamins-Minerals (MULTIVITAMIN & MINERAL PO) Take 1 tablet by mouth daily.        Marland Kitchen OVER THE COUNTER MEDICATION Concentrated tart cherry juice--2 tablespoons every day      . OVER THE COUNTER MEDICATION Calcium 1000mg  every day      . Probiotic Product (ALIGN PO) Take by mouth.      . Pseudoephedrine-Guaifenesin (MUCINEX D) [phone] MG TB12 Take 1 tablet by mouth daily.        . traMADol (ULTRAM) 50 MG tablet TAKE 1 TABLET BY MOUTH EVERY 6 HOURS AS NEEDED FOR PAIN.  90 tablet  2  . ciprofloxacin (CIPRO) 500 MG tablet Take 1 tablet (500 mg total) by mouth 2 (two) times daily.  20 tablet  0  . levofloxacin (LEVAQUIN) 750 MG tablet Take 1 tablet (750 mg total) by mouth daily. As directed  10 tablet  0  . [DISCONTINUED] acetaminophen (TYLENOL) 325 MG tablet Take 650 mg by mouth every 6 (six) hours as needed.        . [DISCONTINUED] amoxicillin-clavulanate (AUGMENTIN) 875-125 MG per tablet Take 1 tablet by mouth 2 (two) times daily.  20 tablet  0  . [DISCONTINUED] estradiol (VAGIFEM) 25 MCG vaginal tablet Use 2 times per week       . [DISCONTINUED] Wheat Dextrin (BENEFIBER PO) Take by mouth. Uses store brand       No facility-administered encounter medications on file as of 11/10/2012.    No Known Allergies   Current Medications, Allergies, Past Medical History, Past Surgical History, Family History, and Social History were reviewed in [employer] record.     Review of Systems          See HPI - all other systems neg except as noted...  The patient complains of dyspnea on exertion and abdominal pain.  The patient denies anorexia, fever, weight loss, weight gain, vision loss, decreased hearing, hoarseness, chest pain, syncope, peripheral edema, prolonged cough, headaches, hemoptysis, melena, hematochezia, severe indigestion/heartburn, hematuria, incontinence, muscle weakness, suspicious skin lesions, transient blindness, difficulty walking, depression, unusual weight change, abnormal bleeding, enlarged lymph nodes, and angioedema.     Objective:   Physical Exam     WD, WN, 79 y/o WF in NAD... GENERAL:  Alert & oriented; pleasant & cooperative... HEENT:  [place]/AT, EOM-wnl, PERRLA, EACs-clear, TMs-wnl, NOSE-clear, THROAT-clear & wnl. NECK:  Supple w/ fairROM; no JVD; normal carotid impulses w/o bruits; no thyromegaly or nodules palpated; no lymphadenopathy. CHEST:  scat bilat rhonchi without wheezing, rales or signs of consolidation... HEART:  Regular Rhythm; without murmurs/ rubs/ or gallops heard... ABDOMEN:  Soft w/ mild tender LLQ; normal bowel sounds; no organomegaly or masses detected. EXT: without deformities or arthritic changes; no varicose veins/ venous insuffic/ or edema. NEURO:  CN's intact;  no focal neuro deficits... DERM:  No lesions noted; no rash etc...  RADIOLOGY DATA:  Reviewed in the EPIC EMR & discussed w/ the patient...  LABORATORY DATA:  Reviewed in the EPIC EMR & discussed w/ the  patient...    Assessment & Plan:    COPD/ Bronchiectasis/ Hx pneumonia>  On Advair250, Mucinex2Bid, Fluids & vigorous Chest PT/ postural drainage; sputum for C&S; 9/13> treating acute exac w/ Levaquin x10d; Cult grew a resist Pseudomonas but she reports clinically improved, therefore continue present Rx & follow... Repeat Sput C&S 4/14 showed pansens Pseudomonas...  CHOL>  On diet alone & f/u FLP is at goal...  Breast Cancer>  S/p surg by [facility] in [facility] & oncology f/u by  DrNeijstrom, then [facility] 2003 & released; yearly mammograms & monthly self exams were wnl...  Divertics>  She had acute diverticulitis 3/12 & 10/12> resolved w/ diet adjust & Cipro/Flagyl, now back to baseline & advised re- Metamucil, Fiberall, etc; she saw DrPatterson 11/12.  DJD/ LBP>  Ortho is DrDaldorf & DrFields; she takes Glucosamine & Herbs and doing satis she says...  Osteopenia/ Vit D defic>  On Calcium, MVI, Vit D supplement; followed by GYN= DrRomaine...  Other medical issues as noted...    Patient's Medications  New Prescriptions   No medications on file  Previous Medications   ACETAMINOPHEN (TYLENOL) 500 MG TABLET    Take 500 mg by mouth every 6 (six) hours as needed for pain.   ADVAIR DISKUS 250-50 MCG/DOSE AEPB    INHALE 1 PUFF TWICE A DAY   ASPIRIN 81 MG TABLET    Take 81 mg by mouth daily.     BLACK CURRANT SEED OIL PO    Take 1 tablet by mouth daily.     CALCIUM CARBONATE 200 MG CAPSULE    Take 250 mg by mouth 2 (two) times daily with a meal.   CIPROFLOXACIN (CIPRO) 500 MG TABLET    Take 1 tablet (500 mg total) by mouth 2 (two) times daily.   ESTRADIOL 10 MCG TABS VAGINAL TABLET    Place 1 tablet (10 mcg total) vaginally 2 (two) times a week.   GLUCOSAMINE-CHONDROITIN 500-400 MG TABLET    Take 1 tablet by mouth daily.     LEVOFLOXACIN (LEVAQUIN) 750 MG TABLET    Take 1 tablet (750 mg total) by mouth daily. As directed   MULTIPLE VITAMINS-MINERALS (MULTIVITAMIN & MINERAL PO)    Take 1 tablet by mouth daily.     OVER THE COUNTER MEDICATION    Concentrated tart cherry juice--2 tablespoons every day   OVER THE COUNTER MEDICATION    Calcium 1000mg  every day   PROBIOTIC PRODUCT (ALIGN PO)    Take by mouth.   PSEUDOEPHEDRINE-GUAIFENESIN (MUCINEX D) [phone] MG TB12    Take 1 tablet by mouth daily.     TRAMADOL (ULTRAM) 50 MG TABLET    TAKE 1 TABLET BY MOUTH EVERY 6 HOURS AS NEEDED FOR PAIN.  Modified Medications   No medications on file  Discontinued Medications    ACETAMINOPHEN (TYLENOL) 325 MG TABLET    Take 650 mg by mouth every 6 (six) hours as needed.     AMOXICILLIN-CLAVULANATE (AUGMENTIN) 875-125 MG PER TABLET    Take 1 tablet by mouth 2 (two) times daily.   ESTRADIOL (VAGIFEM) 25 MCG VAGINAL TABLET    Use 2 times per week    WHEAT DEXTRIN (BENEFIBER PO)    Take by mouth. Uses store brand

## 2012-12-29 ENCOUNTER — Encounter: Payer: Self-pay | Admitting: Sports Medicine

## 2012-12-29 ENCOUNTER — Ambulatory Visit (INDEPENDENT_AMBULATORY_CARE_PROVIDER_SITE_OTHER): Payer: Medicare Other | Admitting: Sports Medicine

## 2012-12-29 VITALS — BP 162/76 | Ht 64.0 in | Wt 131.0 lb

## 2012-12-29 DIAGNOSIS — M1990 Unspecified osteoarthritis, unspecified site: Secondary | ICD-10-CM

## 2012-12-29 MED ORDER — METHYLPREDNISOLONE ACETATE 40 MG/ML IJ SUSP
40.0000 mg | Freq: Once | INTRAMUSCULAR | Status: AC
  Administered 2012-12-29: 40 mg via INTRA_ARTICULAR

## 2012-12-29 NOTE — Assessment & Plan Note (Signed)
This involves multiple joints but the most affected is the right knee and left hip  The left hip is doing well since we gave her a trochanteric bursa injection  The right knee is now more painful  Ultrasound Rt knee Examination showed loss of the joint space on the medial compartment There is a small suprapatellar pouch effusion Lateral meniscus is preserved  Procedure:  Injection of right knee Consent obtained and verified. Time-out conducted. Noted no overlying erythema, induration, or other signs of local infection. Skin prepped in a sterile fashion. Topical analgesic spray: Ethyl chloride for placement of a small wheal above upper outer patella --Procedure was done under ultrasound guidance with good visualization of the suprapatellar pouch. We were able to visualize the injection of steroid and that this entered the joint effectively. Completed without difficulty. Meds: 1 cc Solu-Medrol 40 and 4 cc 1% lidocaine Pain immediately improved suggesting accurate placement of the medication. Advised to call if fevers/chills, erythema, induration, drainage, or persistent bleeding.  She will let us know if she is getting good relief after couple weeks and we will recheck her as needed

## 2012-12-29 NOTE — Progress Notes (Addendum)
  Subjective:    Patient ID: Miranda Thompson, female    DOB: 08-26-1933, 79 y.o.   MRN: [id number]  HPI  Pt presents to clinic for f/u of rt knee pain, would like injection today. Hx of bilat knee arthritis. Has had good success with knee injection in the past.  Using bodyhelix knee sleeve which is helpful She continues doing walking program The last injection to her right knee was 1 year ago  Bronchiectasis been stable recently   Review of Systems     Objective:   Physical Exam Pleasant older female in no acute distress  Lt knee: Medial compartment degenerative changes -5 deg extension 150 deg flexion  No Baker's cyst  Lt hip: 20 deg IR 45 deg ER    Rt knee: -8 deg full extension 145 deg flexion Has a baker's cyst Medial hypertrophy No clear effusion  Rt hip: 15 deg IR  60 deg ER           Assessment & Plan:

## 2013-01-26 ENCOUNTER — Encounter: Payer: Self-pay | Admitting: Certified Nurse Midwife

## 2013-01-27 ENCOUNTER — Encounter: Payer: Self-pay | Admitting: Certified Nurse Midwife

## 2013-01-27 ENCOUNTER — Ambulatory Visit (INDEPENDENT_AMBULATORY_CARE_PROVIDER_SITE_OTHER): Payer: Medicare Other | Admitting: Certified Nurse Midwife

## 2013-01-27 VITALS — BP 122/62 | HR 62 | Resp 16 | Ht 63.25 in | Wt 132.0 lb

## 2013-01-27 DIAGNOSIS — N952 Postmenopausal atrophic vaginitis: Secondary | ICD-10-CM

## 2013-01-27 DIAGNOSIS — Z01419 Encounter for gynecological examination (general) (routine) without abnormal findings: Secondary | ICD-10-CM

## 2013-01-27 MED ORDER — ESTRADIOL 10 MCG VA TABS: 1.0000 | ORAL_TABLET | VAGINAL | Status: DC

## 2013-01-27 NOTE — Patient Instructions (Signed)

## 2013-01-27 NOTE — Progress Notes (Signed)
79 y.o. [id number] Married Caucasian Fe here for annual exam.  Menopausal no HRT, denies vaginal bleeding. Using Vagifem twice weekly with good response. Sees PCP every 6 months for medcation management and labs. No colitis flares. "Good year"  Patient's last menstrual period was 02/23/1985.          Sexually active: no  The current method of family planning is vasectomy.    Exercising: yes  walking & weights Smoker:  no  Health Maintenance: Pap:  11-09-08 neg MMG:  2014 Colonoscopy:  2011 BMD:   2011 TDaP:  With pcp Labs: none Self breast exam: done occ   reports that she quit smoking about 45 years ago. Her smoking use included Cigarettes. She smoked 0.00 packs per day for 40 years. She has never used smokeless tobacco. She reports that she drinks alcohol. She reports that she does not use illicit drugs.  Past Medical History  Diagnosis Date  . Allergic rhinitis   . Bronchiectasis with acute exacerbation   . Hemoptysis   . History of pneumonia   . Hypercholesteremia   . Diverticulosis of colon   . DJD (degenerative joint disease)   . Low back pain syndrome   . Rotator cuff syndrome of right shoulder   . Osteopenia   . Anemia   . Family history of malignant neoplasm of gastrointestinal tract   . Pseudomonas infection   . Breast cancer, left breast 1994 with lumpectomy    chemo,tamox  . Fibromyalgia   . Spinal stenosis     Past Surgical History  Procedure Laterality Date  . Lung biopsy  1968    TSurg---Dr. Bascom Levels  . Left breast lumpectomy and ln dissection  06/1992    Dr. Ezzard Standing  . Tonsillectomy      Current Outpatient Prescriptions  Medication Sig Dispense Refill  . acetaminophen (TYLENOL) 500 MG tablet Take 500 mg by mouth every 6 (six) hours as needed for pain.      Marland Kitchen ADVAIR DISKUS 250-50 MCG/DOSE AEPB INHALE 1 PUFF TWICE A DAY  60 each  11  . aspirin 81 MG tablet Take 81 mg by mouth daily.        Marland Kitchen BLACK CURRANT SEED OIL PO Take 1 tablet by mouth daily.        .  Estradiol 10 MCG TABS vaginal tablet Place 1 tablet (10 mcg total) vaginally 2 (two) times a week.  8 tablet  6  . glucosamine-chondroitin 500-400 MG tablet Take 1 tablet by mouth daily.        . Multiple Vitamins-Minerals (MULTIVITAMIN & MINERAL PO) Take 1 tablet by mouth daily.        Marland Kitchen OVER THE COUNTER MEDICATION Concentrated tart cherry juice--2 tablespoons every day      . OVER THE COUNTER MEDICATION 600 mg. Calcium Daily      . Probiotic Product (ALIGN PO) Take by mouth.      . Pseudoephedrine-Guaifenesin (MUCINEX D) [phone] MG TB12 Take 1 tablet by mouth daily.        . traMADol (ULTRAM) 50 MG tablet TAKE 1 TABLET BY MOUTH EVERY 6 HOURS AS NEEDED FOR PAIN.  90 tablet  2   No current facility-administered medications for this visit.    Family History  Problem Relation Age of Onset  . Heart attack Mother     Deceased age 58  . Colon cancer Father     deceased age 16 from colon ca.  . Hypertension Brother     ROS:  Pertinent items are noted in HPI.  Otherwise, a comprehensive ROS was negative.  Exam:   BP 122/62  Pulse 62  Resp 16  Ht 5' 3.25" (1.607 m)  Wt 132 lb (59.875 kg)  BMI 23.19 kg/m2  LMP 02/23/1985 Height: 5' 3.25" (160.7 cm)  Ht Readings from Last 3 Encounters:  01/27/13 5' 3.25" (1.607 m)  12/29/12 5\' 4"  (1.626 m)  11/10/12 5\' 4"  (1.626 m)    General appearance: alert, cooperative and appears stated age Head: Normocephalic, without obvious abnormality, atraumatic Neck: no adenopathy, supple, symmetrical, trachea midline and thyroid normal to inspection and palpation and non-palpable Lungs: clear to auscultation bilaterally Breasts: normal appearance, no masses or tenderness, No nipple retraction or dimpling, No nipple discharge or bleeding, No axillary or supraclavicular adenopathy Heart: regular rate and rhythm Abdomen: soft, non-tender; no masses,  no organomegaly Extremities: extremities normal, atraumatic, no cyanosis or edema Skin: Skin color,  texture, turgor normal. No rashes or lesions Lymph nodes: Cervical, supraclavicular, and axillary nodes normal. No abnormal inguinal nodes palpated Neurologic: Grossly normal   Pelvic: External genitalia:  no lesions              Urethra:  normal appearing urethra with no masses, tenderness or lesions              Bartholin's and Skene's: normal                 Vagina: atrophic appearing vagina with normal color and discharge, no lesions              Cervix: normal, non tender              Pap taken: no Bimanual Exam:  Uterus:  normal size, contour, position, consistency, mobility, non-tender and anteverted              Adnexa: normal adnexa and no mass, fullness, tenderness               Rectovaginal: Confirms               Anus:  normal sphincter tone, no lesions  A:  Well Woman with normal exam  Menopausal no HRT  Atrophic Vaginitis Vagifem working well  History of Left breast cancer  P:   Reviewed health and wellness pertinent to exam  Aware of need to evaluate if vaginal bleeding  Rx Vagifem see order  Pap smear as per guidelines   Mammogram yearly stressed with SBE pap smear not taken today  counseled on breast self exam, mammography screening, adequate intake of calcium and vitamin D, diet and exercise, Kegel's exercises return annually or prn  An After Visit Summary was printed and given to the patient.

## 2013-01-28 NOTE — Progress Notes (Signed)
Note reviewed, agree with plan.  Eleftherios Dudenhoeffer, MD  

## 2013-05-03 ENCOUNTER — Telehealth: Payer: Self-pay | Admitting: Pulmonary Disease

## 2013-05-03 DIAGNOSIS — E785 Hyperlipidemia, unspecified: Secondary | ICD-10-CM

## 2013-05-03 DIAGNOSIS — M899 Disorder of bone, unspecified: Secondary | ICD-10-CM

## 2013-05-03 DIAGNOSIS — D649 Anemia, unspecified: Secondary | ICD-10-CM

## 2013-05-03 DIAGNOSIS — F419 Anxiety disorder, unspecified: Secondary | ICD-10-CM

## 2013-05-03 DIAGNOSIS — M949 Disorder of cartilage, unspecified: Secondary | ICD-10-CM

## 2013-05-03 DIAGNOSIS — I10 Essential (primary) hypertension: Secondary | ICD-10-CM

## 2013-05-03 NOTE — Telephone Encounter (Signed)
Please advise leigh what labs pt will need to have done? thanks

## 2013-05-04 NOTE — Telephone Encounter (Signed)
Pt is calling back for an update, please.

## 2013-05-04 NOTE — Telephone Encounter (Signed)
Spoke with the pt and notified that we have sent msg to SN and his nurse to see what labs are needed and we will call her to let her know once they are put in the computer Pt verbalized understanding and states nothing further needed

## 2013-05-05 ENCOUNTER — Other Ambulatory Visit (INDEPENDENT_AMBULATORY_CARE_PROVIDER_SITE_OTHER): Payer: Medicare Other

## 2013-05-05 DIAGNOSIS — I10 Essential (primary) hypertension: Secondary | ICD-10-CM

## 2013-05-05 DIAGNOSIS — M949 Disorder of cartilage, unspecified: Secondary | ICD-10-CM

## 2013-05-05 DIAGNOSIS — M899 Disorder of bone, unspecified: Secondary | ICD-10-CM

## 2013-05-05 DIAGNOSIS — D649 Anemia, unspecified: Secondary | ICD-10-CM

## 2013-05-05 DIAGNOSIS — F411 Generalized anxiety disorder: Secondary | ICD-10-CM

## 2013-05-05 DIAGNOSIS — F419 Anxiety disorder, unspecified: Secondary | ICD-10-CM

## 2013-05-05 DIAGNOSIS — E785 Hyperlipidemia, unspecified: Secondary | ICD-10-CM

## 2013-05-05 LAB — LIPID PANEL
Cholesterol: 172 mg/dL (ref 0–200)
HDL: 56.4 mg/dL (ref >39.00)
LDL Cholesterol: 106 mg/dL — ABNORMAL HIGH (ref 0–99)
Total CHOL/HDL Ratio: 3
Triglycerides: 50 mg/dL (ref 0.0–149.0)
VLDL: 10 mg/dL (ref 0.0–40.0)

## 2013-05-05 LAB — IBC PANEL
Iron: 69 ug/dL (ref 42–145)
Saturation Ratios: 19.2 % — ABNORMAL LOW (ref 20.0–50.0)
Transferrin: 256.8 mg/dL (ref 212.0–360.0)

## 2013-05-05 LAB — CBC WITH DIFFERENTIAL/PLATELET
Basophils Absolute: 0 10*3/uL (ref 0.0–0.1)
Basophils Relative: 0.6 % (ref 0.0–3.0)
Eosinophils Absolute: 0.2 10*3/uL (ref 0.0–0.7)
Eosinophils Relative: 5.4 % — ABNORMAL HIGH (ref 0.0–5.0)
HCT: 39.3 % (ref 36.0–46.0)
Hemoglobin: 13.4 g/dL (ref 12.0–15.0)
Lymphocytes Relative: 26.4 % (ref 12.0–46.0)
Lymphs Abs: 1.1 10*3/uL (ref 0.7–4.0)
MCHC: 34.2 g/dL (ref 30.0–36.0)
MCV: 95.2 fl (ref 78.0–100.0)
Monocytes Absolute: 0.4 10*3/uL (ref 0.1–1.0)
Monocytes Relative: 9.5 % (ref 3.0–12.0)
Neutro Abs: 2.4 10*3/uL (ref 1.4–7.7)
Neutrophils Relative %: 58.1 % (ref 43.0–77.0)
Platelets: 221 10*3/uL (ref 150.0–400.0)
RBC: 4.13 Mil/uL (ref 3.87–5.11)
RDW: 12.7 % (ref 11.5–14.6)
WBC: 4.1 10*3/uL — ABNORMAL LOW (ref 4.5–10.5)

## 2013-05-05 LAB — HEPATIC FUNCTION PANEL
ALT: 17 U/L (ref 0–35)
AST: 25 U/L (ref 0–37)
Albumin: 3.9 g/dL (ref 3.5–5.2)
Alkaline Phosphatase: 66 U/L (ref 39–117)
Bilirubin, Direct: 0.1 mg/dL (ref 0.0–0.3)
Total Bilirubin: 0.9 mg/dL (ref 0.3–1.2)
Total Protein: 7.4 g/dL (ref 6.0–8.3)

## 2013-05-05 LAB — TSH: TSH: 4.33 u[IU]/mL (ref 0.35–5.50)

## 2013-05-05 LAB — URINALYSIS, ROUTINE W REFLEX MICROSCOPIC
Bilirubin Urine: NEGATIVE
Ketones, ur: NEGATIVE
Leukocytes, UA: NEGATIVE
Nitrite: NEGATIVE
Specific Gravity, Urine: 1.01 (ref 1.000–1.030)
Total Protein, Urine: NEGATIVE
Urine Glucose: NEGATIVE
Urobilinogen, UA: 0.2 (ref 0.0–1.0)
pH: 7 (ref 5.0–8.0)

## 2013-05-05 LAB — BASIC METABOLIC PANEL
BUN: 15 mg/dL (ref 6–23)
CO2: 30 mEq/L (ref 19–32)
Calcium: 9.2 mg/dL (ref 8.4–10.5)
Chloride: 101 mEq/L (ref 96–112)
Creatinine, Ser: 0.6 mg/dL (ref 0.4–1.2)
GFR: 104.78 mL/min (ref >60.00)
Glucose, Bld: 90 mg/dL (ref 70–99)
Potassium: 4.7 mEq/L (ref 3.5–5.1)
Sodium: 136 mEq/L (ref 135–145)

## 2013-05-05 NOTE — Telephone Encounter (Signed)
Pt had CBCD, IBC panel done 9.14.14 > WNL Pt had Lipid, BMET, Hepatic, TSH, Vit D, Udip 3.5.14  At the 9.18.14 ov w/ SN, patient instructions inform pt to return in 6 months with fasting labs and cxr.  Pt has upcoming ov w/ SN on 3.18.15. Orders placed for CBCD, IBC, Lipid, BMET, Hepatic, TSH, Vit D, Udip  Orders placed.  Nothing further needed; will sign off.

## 2013-05-06 LAB — VITAMIN D 25 HYDROXY (VIT D DEFICIENCY, FRACTURES): Vit D, 25-Hydroxy: 49 ng/mL (ref 30–89)

## 2013-05-10 ENCOUNTER — Ambulatory Visit (INDEPENDENT_AMBULATORY_CARE_PROVIDER_SITE_OTHER): Payer: Medicare Other | Admitting: Pulmonary Disease

## 2013-05-10 ENCOUNTER — Ambulatory Visit (INDEPENDENT_AMBULATORY_CARE_PROVIDER_SITE_OTHER)
Admission: RE | Admit: 2013-05-10 | Discharge: 2013-05-10 | Disposition: A | Discharge status: Home / Self Care | Payer: Medicare Other | Source: Ambulatory Visit | Attending: Pulmonary Disease | Admitting: Pulmonary Disease

## 2013-05-10 ENCOUNTER — Encounter: Payer: Self-pay | Admitting: Pulmonary Disease

## 2013-05-10 VITALS — BP 114/78 | HR 84 | Temp 98.1°F | Ht 64.0 in | Wt 132.0 lb

## 2013-05-10 DIAGNOSIS — D649 Anemia, unspecified: Secondary | ICD-10-CM

## 2013-05-10 DIAGNOSIS — J479 Bronchiectasis, uncomplicated: Secondary | ICD-10-CM

## 2013-05-10 DIAGNOSIS — C50919 Malignant neoplasm of unspecified site of unspecified female breast: Secondary | ICD-10-CM

## 2013-05-10 DIAGNOSIS — M1990 Unspecified osteoarthritis, unspecified site: Secondary | ICD-10-CM

## 2013-05-10 DIAGNOSIS — I499 Cardiac arrhythmia, unspecified: Secondary | ICD-10-CM

## 2013-05-10 DIAGNOSIS — E785 Hyperlipidemia, unspecified: Secondary | ICD-10-CM

## 2013-05-10 DIAGNOSIS — K5730 Diverticulosis of large intestine without perforation or abscess without bleeding: Secondary | ICD-10-CM

## 2013-05-10 NOTE — Patient Instructions (Signed)
Today we updated your med list in our EPIC system...    Continue your current medications the same...  Today we did your follow up CXR & EKG...    We will contact you w/ the results when available...   We will set up a referral to Cardiology for further evaluation of your Arrhythmia (atrial premature beats w/ bigeminy & recent tachypalpitations)...    In the meanwhile avoid caffeine, pseudophed, decongestants (Mucinex/ Guaifenesin is OK)  Call for any questions...  Let's plan a follow up visit in 49mo, sooner if needed for problems.Marland KitchenMarland Kitchen

## 2013-05-10 NOTE — Progress Notes (Signed)
Patient ID: DEVRA STARE, female   DOB: Aug 31, 1933, 80 y.o.   MRN: [id number]  Subjective:    Patient ID: [name], female    DOB: Dec 31, 1933, 80 y.o.   MRN: [id number]  HPI 80 y/o WF here for a follow up visit... she has mult med problems as noted below...   ~  May 05, 2010:  71moROV & presents w/ a "rough 3-4 weeks"> abd pain LLQ & across lower abd, no ch in bowels & no blood seen, went to see GThornhillshowed left colon swollen, given Cipro 2/22-29 but still febrile, still in pain so DrRomaine gave her another round of Cipro 3/7==> ;  exam shows LLQ tender w/o guarding or rebound & all c/w sigm diverticulitis >> suggested CT Abd & adding FLAGYL + low residue diet to the regimen, ?f/u w/ GI pending CT results.    She has mod severe underlying bronchiectasis & granulomatous lung dis> stable on her regimen of Advair, Mucinex, chestPT/ post drainage;  due for her yearly f/u CXR today = chr pulm scarring, NAD;  Labs showed Chems OK x Na 131, CBC OK w/ Hg 12.8, WBC 7.2, Sed 83...     [facility] +0.3 in Spine, -0.8 in left FemNeck, & -2.1 right forearm (improved in spine but worse in hip & forearm);  treated by DrRomaine on Calcium, MVI, Vit D 1000, and numerous herbs...  ~  November 10, 2010:  649moOV & she continues stable overall; [place];  Prev bout of diverticulitis resolved w/ cipro & Flagyl and back to baseline;  She continues her daily postural drain w/ copious sputum production (no recent cultures) & he same bronchodilator regimen; no new complaints or concerns...  ~  May 11, 2011:  78m778moV & Miliana notes some incr fatigue, dry cough, right sided chest discomfort, & pulse in the 90's;  She takes her Advair250, GFN, & does her chest treatments regularly; she also takes a number of herbs that she believes are beneficial;  She had f/u OV w/ DrPatterson  11/12 after another bout of diverticulitis in Oct... See prob list below>>  CXR 3/13 showed essentially no change> normal heart size, prom pulm arts, bilat interstitial prominence & nodularity, bronchiectasis, NAD, surg clips from prev left breast ca surg.  LABS 3/13:  FLP- at goals on Diet;  Chems- wnl;  CBC- wnl;  TSH=3.63;  VitD=35.  ~  November 02, 2011:  78mo65mo> MariZaelynnled for early add-on visit due to recent incr in cough, chest congestion, thick discolored mucus w/ hemoptysis; she had temp at home to 103 she says & brought this down w/ Tylenol; she has assoc fatigue, SOB, chest pain, & headache; she has a long hx of known bronchiectasis, granulomatous lung dis, & recurrent pneumonias; CXR today shows more consolidated left mid zone opacity (her worst area of bronchiectasis); her only home meds are ADVAIR250Bid & Mucinex, she takes a lot of herbal supplements... We decided to treat this exac w/ LEVAQUIN x10d...  LABS 9/13:  FLP (ok x LDL=108);  BMET (wnl);  Sed (elev at 56) 32SPUTUM C&S:  PSEUDOMONAS resist to Cipro/ Levaquin/ Maxipeme; sens to Fortaz/ Zosyn/ Imipenem; NOTE> she is already improved after finishing empiric Levaquin & we will continue to follow...  ~  May 02, 2012:  78mo 97mo& MarilLynasia  has been stable w/ her bronchiectasis & regular cough, sputum, intermittent hemoptysis, etc... She does a great job w/ chestPT, postural drainage, and takes Mucinex 1200Bid, Fluids, & Advair250... Last sputum 1013 grew Pseudomonas resistant to Cipro/ Levaquin/ Cefipeme/ Genta (we will re-culture to check current sens panel)... She reports recent Cipro x2 for UTIs, breathing is fair, walking some but decr activ due to arthritis (knees hips) attended by DrFields w/ shots given & braces to use (she notes that cherry juice really helps)...     We reviewed prob list, meds, xrays and labs> see below for updates >> she wants Levaquin 729m/d x10d for prn use...  LABS 3/14:  FLP- at goals on diet  alone;  Chems- wnl;  CBC- wnl;  TSH=2.37;  VitD=31;  UA- clear...  SPUTUM 4/14 >> Pseudomonas sensitive to all antibiotics   ~  November 10, 2012:  6259moOV & MaDalilahs about the same- feels tired all the time, chr cough w/ occas hemoptysis (streaky phlegm), no bad episodes; she has known Bronchiectasis on Advair 250Bid & Mucinex600-2Bid w/ fluids; tried on cycic antibiotics in the past but she never felt they really helped & she prefers Prn Cipro/ Levaquin for acute superimposed infectious exacerbations; Last CXR 9/13 showed scarring LUL, chr changes left mid-lung, s/p left mastectomy & axillary node dissection, etc;  Last Sputum C&S 4/14- Pseudomonas sens to Cefepime, Fortaz, Cipro, Levaquin, etc;  She does chest PT & postural drainage daily... She takes a number of supplements including Organic apple cider vinegar daily...    We reviewed prob list, meds, xrays and labs> see below for updates >> OK 2014 Flu vaccine...  LABS 9/14:  CBC- ok w/ Hg=13.7, WBC=6.4;  Fe=91 (25%sat)... BMet wasn't done...   ~  May 10, 2013:  59m81moV & MarIvee c/o HBP, chest pressure, and tachycardia w/ pulse=157 recently; we discussed the need for Cardiac eval & we will refer...     Bronchiectasis & HxPneumonia> on Advair250Bid (not taking regularly) & GFN 1200Bid; mod cough, grey colored sput, intermit hemoptysis, denies f/c/s; last pos culture was 4/14= Pseudomonas, pan-sens & treated w/ Cipro...    CHOL> on diet alone; FLP 3/15 showed TChol 172, TG 50, HDL 56, LDL 106    GI- Divertics> asked to take Align, Metamucil, etc; she had diverticulitis flair treated w/ Cipro & Flagyl    GU- HxUTI w/ pelvic pressure, back pain, freq&urgency> felt to have a chronic cystitis by DrEskridge & she understands asymptomatic bactiuria...    Hx Breast Cancer> she had surg 1994 w/ lumpectomy & LN dissection followed by XRT & Chemorx; [facility]...    DJD, LBP, Osteopenia> on Tylenol, Estradiol topically  per gyn, calcium, MVI, etc; she walks >1mi57mfor exercise; followed by DrFields regularly; [facility]...    HxEczema> she uses CoQ1`0 & Borage Oil; she likes herbal remedies...  We reviewed prob list, meds, xrays and labs> see below for updates >>   CXR 3/15 showed stable film w/ scarring LUL, no change and surg clips left breast & axilla  EKG 3/15 showed NSR w/ PACs and atrial bigem, 1st degree AVB, otherw NAD...   LABS 3/15:  FLP- ok on diet alone;  Chems- wnl;  CBC- wnl;  Fe=69 (19%sat);  VitD=49;  TSH=4.33... UA is clear, wnl...             Problem List:     ALLERGIC RHINITIS (ICD-477.9) - she uses  OTC antihistamines Prn.  BRONCHIECTASIS (ICD-494.0) & Hx of PNEUMONIA (ICD-486) - on ADVAIR 250 Bid, & MUCINEX 1-2Bid...  Long hx severe bronchiectasis, granulomatous lung dis, and pneumonia (initially Dx 1968 by DrFrazier, subseq bx= noncaseating gran dz & chr inflam)... her last bronchoscopy was 4/04 revealing some mucus plugging... CTChest 4/04 showed marked bronchiectasis in lingular, LLL, RML, and mucus plugging... CT repeated 6/06- similar. ~  Sputum cultures 5/08 grew Serratia (R to amox/ceph, S to all others), branching part acid fast bacilli resembling nocardia, yeast c/w candida... ~  CXR 1/09 chr lung dis, no change from prev w/ left mid lung zone as the worst area... ~  CXR 3/10 w/ chr lung changes and NAD suspected... ~  Sputum 8/10 showed NTF only & AFB smears & cult all neg... ~  CXR 3/11 showed chr lung dis, no acute changes, stable... ~  CXR 3/12 showed chronic changes, scarring, NAD.Marland Kitchen. ~  CXR 3/13 showed essentially no change> normal heart size, prom pulm arts, bilat interstitial prominence & nodularity, bronchiectasis, NAD, surg clips from prev left breast ca surg. ~  9/13: she presents w/ an acute exac w/ cough, thick sput, congestion, fever, SOB, & chest discomfort; CXR shows worse left mid zone opacity; We decided to treat w/ empiric  Levaquin x10d pending cult report==> ret w/ resist Pseudomonas, but pt states better/ improved on the Levaquin. ~  Sputum C&S 4/14 showed Pseudomonas sens to Cefepime, Fortaz, Cipro, Levaquin, etc  HYPERCHOLESTEROLEMIA, BORDERLINE (ICD-272.4) - on diet alone... ~  FLP 9/10 showed TChol 174, TG 64, HDL 49, LDL 112 ~  FLP 9/11 showed TChol 175, TG 61, HDL 48, LDL 115 ~  FLP 3/13 showed TChol 176, TG 69, HDL 64, LDL 98 ~  FLP 9/13 showed TChol 169, TG 65, HDL 48, LDL 108 ~  FLP 3/14 showed TChol 157, TG 54, HDL 46, LDL 100  Hx of ADENOCARCINOMA, BREAST (ICD-174.9) - S/P surgery 5/94 [facility] w/ wide excision L breast lesion and LN dissection (stage II 1.0  cm tumor), w/ post op XRT and chemotherapy... she was [facility] for oncology... then DrLivesay (released in 2003). ~  routine Mammogram 2/10 was neg- f/u planned 60yr ~  f/u mammogram 2/11 = neg, NAD, f/u planned 167yr~  f/u mammogram 2/12 = neg, NAD, f/u 1y46yr  f/u mammogram 2/13 = neg, NAD... Marland Kitchen  f/u mammogram 2/14 = neg, scat parenchymal densities...  DIVERTICULOSIS OF COLON (ICD-562.10) - colonoscopy 3/05 by DrPatterson showed divertics only... she has +fam hx colon ca w/ colon check q5yr74yr f/u colonoscopy 5/11 showed +divertics, no polyps... ~  3/12:  Hx, exam & CTAbd c/w diverticulitis; treated w/ Cipro/ Flagyl & resolved;  Advised Metamucil, Miralax, etc. ~  11/12:  She had f/u DrPatterson after another bout of diverticulitis; he notes regular BMs, good appetite, stable weight; he wanted her to STOP Naprosyn, ok to use Tylenol/ Tramadol, rec hi fiber, saw the [place].  DEGENERATIVE JOINT DISEASE (ICD-715.90) - see Ortho eval 2010 by DrDaldorf... she takes Glucosamine & Herbal supplements- milk thistle/ tumeric which she says really helps... 4/14: she saw DrKarlFields for SportsMed> c/o bilat knee pain, known arthritis, uses body helix knee (compression) sleeves & Tramadol prn; takes tart red cherry oil daily & he rec  devil's claw, tumeric, & curcumin...  LOW BACK PAIN SYNDROME (ICD-724.2) - [facility] showed herniated disc L3-4 w/ mod spinal stenosis and lat recess stenosis bilat...   ROTATOR CUFF  SYNDROME, RIGHT (ICD-726.10) - had injection by [facility] 10/07 w/ improvement.  OSTEOPENIA (ICD-733.90) - last BMD [facility] 10/06 showed norm spine, norm hip, osteopenic forearm (-2.0)... followed by GYN = DrRomaine who is treating a low Vit D level... she takes calcium, Vitamins, & Vit D 1000u daily from [place]... ~  labs 9/10 on 50K weekly showed Vit D level = 42... rec> change to 1000 u daily. ~  labs 9/11 on 1000 u daily showed Vit D level = 34... rec> continue daily supplement. ~  Labs 3/13 showed Vit D level = 35... Continue supplements. ~  Labs 3/14 showed Vit D level = 31  Hx of ANEMIA (ICD-285.9) -  resolved... ~  labs 9/10 showed Hg= 13.6 ~  labs 9/11 showed Hg= 13.2 ~  Labs 3/13 showed Hg= 13.4  ECZEMA - she has hx of eczema on her hands and has used CoQ10 and Borage Oil as rec by an Administrator in [place]... recently changed to [facility] Current instead of these other supplements & rash is much improved...   Past Surgical History  Procedure Laterality Date  . Lung biopsy  1968    TSurg---Dr. [name]  . Left breast lumpectomy and ln dissection  06/1992    Dr. [name]  . Tonsillectomy      Outpatient Encounter Prescriptions as of 05/10/2013  Medication Sig  . acetaminophen (TYLENOL) 500 MG tablet Take 500 mg by mouth every 6 (six) hours as needed for pain.  Marland Kitchen ADVAIR DISKUS 250-50 MCG/DOSE AEPB INHALE 1 PUFF TWICE A DAY  . aspirin 81 MG tablet Take 81 mg by mouth daily.    Marland Kitchen BLACK CURRANT SEED OIL PO Take 1 tablet by mouth daily.    . Estradiol 10 MCG TABS vaginal tablet Place 1 tablet (10 mcg total) vaginally 2 (two) times a week.  Marland Kitchen glucosamine-chondroitin 500-400 MG tablet Take 1 tablet by mouth daily.    . Multiple Vitamins-Minerals (MULTIVITAMIN & MINERAL PO)  Take 1 tablet by mouth daily.    Marland Kitchen OVER THE COUNTER MEDICATION Concentrated tart cherry juice--2 tablespoons every day  . OVER THE COUNTER MEDICATION 600 mg. Calcium Daily  . Probiotic Product (ALIGN PO) Take by mouth.  . Pseudoephedrine-Guaifenesin (MUCINEX D) [phone] MG TB12 Take 1 tablet by mouth daily.    . traMADol (ULTRAM) 50 MG tablet TAKE 1 TABLET BY MOUTH EVERY 6 HOURS AS NEEDED FOR PAIN.    No Known Allergies   Current Medications, Allergies, Past Medical History, Past Surgical History, Family History, and Social History were reviewed in [employer] record.     Review of Systems         See HPI - all other systems neg except as noted...  The patient complains of dyspnea on exertion and abdominal pain.  The patient denies anorexia, fever, weight loss, weight gain, vision loss, decreased hearing, hoarseness, chest pain, syncope, peripheral edema, prolonged cough, headaches, hemoptysis, melena, hematochezia, severe indigestion/heartburn, hematuria, incontinence, muscle weakness, suspicious skin lesions, transient blindness, difficulty walking, depression, unusual weight change, abnormal bleeding, enlarged lymph nodes, and angioedema.     Objective:   Physical Exam     WD, WN, 80 y/o WF in NAD... GENERAL:  Alert & oriented; pleasant & cooperative... HEENT:  [place]/AT, EOM-wnl, PERRLA, EACs-clear, TMs-wnl, NOSE-clear, THROAT-clear & wnl. NECK:  Supple w/ fairROM; no JVD; normal carotid impulses w/o bruits; no thyromegaly or nodules palpated; no lymphadenopathy. CHEST:  scat bilat rhonchi without wheezing, rales or signs of consolidation... HEART:  Regular Rhythm; without murmurs/ rubs/ or gallops heard... ABDOMEN:  Soft w/ mild tender LLQ; normal bowel sounds; no organomegaly or masses detected. EXT: without deformities or arthritic changes; no varicose veins/ venous insuffic/ or edema. NEURO:  CN's intact;  no focal neuro deficits... DERM:  No lesions noted;  no rash etc...  RADIOLOGY DATA:  Reviewed in the EPIC EMR & discussed w/ the patient...  LABORATORY DATA:  Reviewed in the EPIC EMR & discussed w/ the patient...    Assessment & Plan:    COPD/ Bronchiectasis/ Hx pneumonia>  On Advair250, Mucinex2Bid, Fluids & vigorous Chest PT/ postural drainage; sputum for C&S; 9/13> treating acute exac w/ Levaquin x10d; Cult grew a resist Pseudomonas but she reports clinically improved, therefore continue present Rx & follow... Repeat Sput C&S 4/14 showed pansens Pseudomonas...  CHOL>  On diet alone & f/u FLP is at goal...  Breast Cancer>  S[facility] & oncology f/u by DrNeijstrom, then Livesay 2003 & released; yearly mammograms & monthly self exams were wnl...  Divertics>  She had acute diverticulitis 3/12 & 10/12> resolved w/ diet adjust & Cipro/Flagyl, now back to baseline & advised re- Metamucil, Fiberall, etc; she saw DrPatterson 11/12.  DJD/ LBP>  Ortho is DrDaldorf & DrFields; she takes Glucosamine & Herbs and doing satis she says...  Osteopenia/ Vit D defic>  On Calcium, MVI, Vit D supplement; followed by GYN= DrRomaine...  Other medical issues as noted.Marland KitchenMarland Kitchen

## 2013-05-15 ENCOUNTER — Other Ambulatory Visit: Payer: Self-pay | Admitting: Pulmonary Disease

## 2013-05-17 ENCOUNTER — Ambulatory Visit (INDEPENDENT_AMBULATORY_CARE_PROVIDER_SITE_OTHER): Payer: Medicare Other | Admitting: Cardiovascular Disease

## 2013-05-17 ENCOUNTER — Encounter: Payer: Self-pay | Admitting: Cardiovascular Disease

## 2013-05-17 VITALS — BP 144/80 | HR 90 | Ht 64.0 in | Wt 132.0 lb

## 2013-05-17 DIAGNOSIS — R0989 Other specified symptoms and signs involving the circulatory and respiratory systems: Secondary | ICD-10-CM

## 2013-05-17 DIAGNOSIS — J479 Bronchiectasis, uncomplicated: Secondary | ICD-10-CM

## 2013-05-17 DIAGNOSIS — R0600 Dyspnea, unspecified: Secondary | ICD-10-CM

## 2013-05-17 DIAGNOSIS — I499 Cardiac arrhythmia, unspecified: Secondary | ICD-10-CM

## 2013-05-17 DIAGNOSIS — E785 Hyperlipidemia, unspecified: Secondary | ICD-10-CM

## 2013-05-17 DIAGNOSIS — R0609 Other forms of dyspnea: Secondary | ICD-10-CM

## 2013-05-17 DIAGNOSIS — R002 Palpitations: Secondary | ICD-10-CM

## 2013-05-17 MED ORDER — ATENOLOL 25 MG PO TABS: 25.0000 mg | ORAL_TABLET | Freq: Every day | ORAL | Status: DC

## 2013-05-17 NOTE — Assessment & Plan Note (Signed)
Cholesterol is at goal.  Continue current dose of statin and diet Rx.  No myalgias or side effects.  F/U  LFT's in 6 months. Lab Results  Component Value Date   LDLCALC 106* 05/05/2013             

## 2013-05-17 NOTE — Assessment & Plan Note (Signed)
Always difficult to Rx in setting of advanced lung disease.  Favor trial of selective beta blocker Atenolol 25 mg  Echo to r/o pulmonary hypertension and assess EF and atrial sizes.  Calcium blockers would be less effective in setting of elevated BP's , high resting heart rate And no MAT.  Consider event monitor after echo and Rx started

## 2013-05-17 NOTE — Assessment & Plan Note (Signed)
Walk test last week with no desats  Will stop beta blocker if it makes her more dyspnic or wheezy  F/U Dr [name]

## 2013-05-17 NOTE — Patient Instructions (Signed)
Your physician recommends that you schedule a follow-up appointment in:  NEXT  AVAILABLE WITH  DR [facility]   Your physician has recommended you make the following change in your medication:  START ATENOLOL  25 MG  [place] has requested that you have an echocardiogram. Echocardiography is a painless test that uses sound waves to create images of your heart. It provides your doctor with information about the size and shape of your heart and how well your heart's chambers and valves are working. This procedure takes approximately one hour. There are no restrictions for this procedure.

## 2013-05-17 NOTE — Progress Notes (Signed)
Patient ID: Miranda Thompson, female   DOB: [birth year], 80 y.o.   MRN: [id number]   80 yo referred by Dr [name] for palpitations.  Has bad chronic lung disease with bronchiectasis  Has had long standing skips but seem to be worse lately with atrial bigeminy seen on office ECG recently.  At one time BP monitor showed pulse of 157 and patient felt a bit dizzy No syncope  She does not wear oxygen  Echo 2 years ago showed good LV and no pulmonary hypertension.  She feels occasional flutterin in chest and senses that her HR is always high in 90's  She does have some wheezing No chest pain chronic mild dyspnea No sputum or fever Labs reviewed and TSH and HCT ok this month  No history of MAT or PAF  No bleeding diathesis      ROS: Denies fever, malais, weight loss, blurry vision, decreased visual acuity, cough, sputum, SOB, hemoptysis, pleuritic pain, palpitaitons, heartburn, abdominal pain, melena, lower extremity edema, claudication, or rash.  All other systems reviewed and negative   General: Affect appropriate Chronically ill femlae  HEENT: normal Neck supple with no adenopathy JVP normal no bruits no thyromegaly Lungs diffuse rhonchi no wheezing and good diaphragmatic motion Heart:  S1/S2 no murmur,rub, gallop or click PMI normal Abdomen: benighn, BS positve, no tenderness, no AAA no bruit.  No HSM or HJR Distal pulses intact with no bruits No edema Neuro non-focal Skin warm and dry No muscular weakness  Medications Current Outpatient Prescriptions  Medication Sig Dispense Refill  . acetaminophen (TYLENOL) 500 MG tablet Take 500 mg by mouth every 6 (six) hours as needed for pain.      Marland Kitchen ADVAIR DISKUS 250-50 MCG/DOSE AEPB INHALE 1 PUFF TWICE A DAY  60 each  1  . aspirin 81 MG tablet Take 81 mg by mouth daily.        Marland Kitchen BLACK CURRANT SEED OIL PO Take 1 tablet by mouth daily.        . Estradiol 10 MCG TABS vaginal tablet Place 1 tablet (10 mcg total) vaginally 2 (two) times a week.  24  tablet  4  . glucosamine-chondroitin 500-400 MG tablet Take 1 tablet by mouth daily.        Marland Kitchen guaiFENesin (MUCINEX) 600 MG 12 hr tablet Take 1,200 mg by mouth 2 (two) times daily.      . Multiple Vitamins-Minerals (MULTIVITAMIN & MINERAL PO) Take 1 tablet by mouth daily.        Marland Kitchen OVER THE COUNTER MEDICATION 600 mg. Calcium Daily      . Probiotic Product (ALIGN PO) Take by mouth.      Marland Kitchen atenolol (TENORMIN) 25 MG tablet Take 1 tablet (25 mg total) by mouth daily.  90 tablet  3   No current facility-administered medications for this visit.    Allergies Review of patient's allergies indicates no known allergies.  Family History: Family History  Problem Relation Age of Onset  . Heart attack Mother     Deceased age 66  . Colon cancer Father     deceased age 52 from colon ca.  . Hypertension Brother     Social History: History   Social History  . Marital Status: Married    Spouse Name: N/A    Number of Children: 13  . Years of Education: N/A   Occupational History  . Retired    Social History Main Topics  . Smoking status: Former Smoker -- 0.25 packs/day  for 40 years    Types: Cigarettes    Quit date: 02/24/1967  . Smokeless tobacco: Never Used  . Alcohol Use: Yes     Comment: 1/2 glass a week  . Drug Use: No  . Sexual Activity: No     Comment: husband vasectomy   Other Topics Concern  . Not on file   Social History Narrative  . No narrative on file    Electrocardiogram:  SR atrial bigemmny   Assessment and Plan

## 2013-05-19 ENCOUNTER — Ambulatory Visit (HOSPITAL_COMMUNITY): Payer: Medicare Other | Attending: Cardiology | Admitting: Radiology

## 2013-05-19 ENCOUNTER — Other Ambulatory Visit: Payer: Self-pay

## 2013-05-19 ENCOUNTER — Telehealth: Payer: Self-pay | Admitting: Cardiovascular Disease

## 2013-05-19 DIAGNOSIS — R0989 Other specified symptoms and signs involving the circulatory and respiratory systems: Secondary | ICD-10-CM | POA: Insufficient documentation

## 2013-05-19 DIAGNOSIS — R0602 Shortness of breath: Secondary | ICD-10-CM

## 2013-05-19 DIAGNOSIS — R0609 Other forms of dyspnea: Secondary | ICD-10-CM | POA: Insufficient documentation

## 2013-05-19 DIAGNOSIS — R002 Palpitations: Secondary | ICD-10-CM | POA: Insufficient documentation

## 2013-05-19 DIAGNOSIS — R0600 Dyspnea, unspecified: Secondary | ICD-10-CM

## 2013-05-19 NOTE — Progress Notes (Signed)
Echocardiogram performed.  

## 2013-05-19 NOTE — Telephone Encounter (Signed)
ENCOURAGED PT TO MONITOR  B/P AND  HEART RATE   NEED   MOR THAN   READING   TO MAKE  MED  CHANGE   NO  OTHER  SYMPTOMS REPORTED  ENCOURAGED TO  KEEP LOG  AND  CALL   TUES  WITH  UPDATE     PT  AWARE IF  DOES   HAVE  SYMPTOMS  MAY    CUT  MED IN HALF  HEART  RATE  WAS  85  AT  THE TIME OF THE  89/53  READING .Adonis Housekeeper

## 2013-05-19 NOTE — Telephone Encounter (Signed)
New message     Patient calling with blood pressure issues  Today  89/53 @ 2:30pm  . Please advise on medication adjustment.

## 2013-05-19 NOTE — Telephone Encounter (Signed)
Only on lowest dose of atenolol continue if possible

## 2013-05-22 ENCOUNTER — Other Ambulatory Visit (HOSPITAL_COMMUNITY): Payer: Medicare Other

## 2013-05-22 NOTE — Telephone Encounter (Signed)
PT  NOTIFIED. PER  PT   B/P  MUCH BETTER/CY

## 2013-06-02 ENCOUNTER — Encounter: Payer: Self-pay | Admitting: Cardiovascular Disease

## 2013-06-02 ENCOUNTER — Ambulatory Visit (INDEPENDENT_AMBULATORY_CARE_PROVIDER_SITE_OTHER): Payer: Medicare Other | Admitting: Cardiovascular Disease

## 2013-06-02 VITALS — BP 135/55 | HR 69 | Ht 64.0 in | Wt 132.4 lb

## 2013-06-02 DIAGNOSIS — J479 Bronchiectasis, uncomplicated: Secondary | ICD-10-CM

## 2013-06-02 DIAGNOSIS — I499 Cardiac arrhythmia, unspecified: Secondary | ICD-10-CM

## 2013-06-02 DIAGNOSIS — E785 Hyperlipidemia, unspecified: Secondary | ICD-10-CM

## 2013-06-02 NOTE — Assessment & Plan Note (Signed)
Cholesterol is at goal.  Continue current dose of statin and diet Rx.  No myalgias or side effects.  F/U  LFT's in 6 months. Lab Results  Component Value Date   LDLCALC 106* 05/05/2013             

## 2013-06-02 NOTE — Patient Instructions (Signed)
Your physician wants you to follow-up in:  6 MONTHS WITH DR NISHAN  You will receive a reminder letter in the mail two months in advance. If you don't receive a letter, please call our office to schedule the follow-up appointment. Your physician recommends that you continue on your current medications as directed. Please refer to the Current Medication list given to you today. 

## 2013-06-02 NOTE — Progress Notes (Signed)
Patient ID: Miranda Thompson, female   DOB: 10/13/1933, 81 y.o.   MRN: [id number] 80 yo referred by Dr [name] for palpitations. Has bad chronic lung disease with bronchiectasis Has had long standing skips but seem to be worse lately with atrial bigeminy seen on office ECG recently. At one time BP monitor showed pulse of 157 and patient felt a bit dizzy No syncope She does not wear oxygen Echo 2 years ago showed good LV and no pulmonary hypertension. She feels occasional flutterin in chest and senses that her HR is always high in 90's She does have some wheezing No chest pain chronic mild dyspnea  No sputum or fever Labs reviewed and TSH and HCT ok this month No history of MAT or PAF No bleeding diathesis   Started on Atenolol 3/25  Feels much better with BP and HR readings at home good   Echo ok with normal EF  3/27  Study Conclusions  - Left ventricle: The cavity size was normal. Systolic function was normal. The estimated ejection fraction was in the range of 55% to 60%. Wall motion was normal; there were no regional wall motion abnormalities. Doppler parameters are consistent with mildly elevated ventricular end-diastolic filling pressure. - Aortic valve: Trileaflet; mildly thickened leaflets. Transvalvular velocity was within the normal range. There was no stenosis. No regurgitation. - Aortic root: The aortic root was normal in size. - Mitral valve: Mildly thickened leaflets . Mild regurgitation. - Right ventricle: The cavity size was mildly dilated. Wall thickness was normal. Systolic function was normal. - Right atrium: The atrium was mildly dilated. - Atrial septum: No defect or patent foramen ovale was identified. - Tricuspid valve: Mild-moderate regurgitation. - Pulmonic valve: Mild regurgitation. - Pulmonary arteries: Systolic pressure was within the normal range. PA peak pressure: 70mm Hg (S).       ROS: Denies fever, malais, weight loss, blurry vision, decreased visual  acuity, cough, sputum, SOB, hemoptysis, pleuritic pain, palpitaitons, heartburn, abdominal pain, melena, lower extremity edema, claudication, or rash.  All other systems reviewed and negative  General: Affect appropriate Frail elderly female  HEENT: normal Neck supple with no adenopathy JVP normal no bruits no thyromegaly Lungs interstitial crackles  no wheezing and good diaphragmatic motion Heart:  S1/S2 no murmur, no rub, gallop or click PMI normal Abdomen: benighn, BS positve, no tenderness, no AAA no bruit.  No HSM or HJR Distal pulses intact with no bruits No edema Neuro non-focal Skin warm and dry No muscular weakness   Current Outpatient Prescriptions  Medication Sig Dispense Refill  . acetaminophen (TYLENOL) 500 MG tablet Take 500 mg by mouth every 6 (six) hours as needed for pain.      Marland Kitchen aspirin 81 MG tablet Take 81 mg by mouth daily.        Marland Kitchen atenolol (TENORMIN) 25 MG tablet Take 1 tablet (25 mg total) by mouth daily.  90 tablet  3  . BLACK CURRANT SEED OIL PO Take 1 tablet by mouth daily.        . Estradiol 10 MCG TABS vaginal tablet Place 1 tablet (10 mcg total) vaginally 2 (two) times a week.  24 tablet  4  . Fluticasone-Salmeterol (ADVAIR DISKUS) 250-50 MCG/DOSE AEPB INHALE 1 PUFF ONCE A DAY      . glucosamine-chondroitin 500-400 MG tablet Take 1 tablet by mouth daily.        Marland Kitchen guaiFENesin (MUCINEX) 600 MG 12 hr tablet Take 1,200 mg by mouth 2 (two) times daily.      Marland Kitchen  Multiple Vitamins-Minerals (MULTIVITAMIN & MINERAL PO) Take 1 tablet by mouth daily.        Marland Kitchen OVER THE COUNTER MEDICATION 600 mg. Calcium Daily      . Probiotic Product (ALIGN PO) Take by mouth.       No current facility-administered medications for this visit.    Allergies  Review of patient's allergies indicates no known allergies.  Electrocardiogram:  SR atrial bigeminny   Assessment and Plan

## 2013-06-02 NOTE — Assessment & Plan Note (Signed)
Tolerating beta blocker.  Atrial bigeminy is gone.  She has been taking atenolol at 11:00pm  Asked her to try it in am in stead

## 2013-06-02 NOTE — Assessment & Plan Note (Signed)
F/u Miranda Thompson  Has had pneumovax  No cough sputum or fever  Currently not on oxgyen

## 2013-08-02 ENCOUNTER — Ambulatory Visit (INDEPENDENT_AMBULATORY_CARE_PROVIDER_SITE_OTHER): Payer: Medicare Other | Admitting: Sports Medicine

## 2013-08-02 ENCOUNTER — Encounter: Payer: Self-pay | Admitting: Sports Medicine

## 2013-08-02 VITALS — BP 149/67 | Ht 64.0 in | Wt 130.0 lb

## 2013-08-02 DIAGNOSIS — M25569 Pain in unspecified knee: Secondary | ICD-10-CM

## 2013-08-02 DIAGNOSIS — M25562 Pain in left knee: Secondary | ICD-10-CM

## 2013-08-02 NOTE — Progress Notes (Signed)
Subjective:   CC: Left knee and hip pain  HPI:   Left knee pain Patient is a 80 y.o. female with h/o left hip and bilateral knee osteoarthritis here with worsened left knee pain. She reports that 5 days ago she began having terrible lateral left knee pain out of the blue with no obvious trigger. It was painful to walk, touch it, or range of motion. She denies swelling, redness, heat, fevers, or chills. Pain extended around back of knee. She took one tramadol and was using heat therapy which helped. Pain limited use of her knee compression sleeve. Pain has been improving but is still present. She denies new leg weakness and although over the past 2-3 weeks she has not been walking her usual, she still has been able to walk about 1 mile daily (with a 2-3 day pause after symptom onset).  Left hip pain She feels like because of knee pain, she was walking differently and favoring the left knee. About a day after knee pain, hip started to feel sore. Denies heat, redness, swelling, or sensation of dislocation. Heating pad is also helping this.   Review of Systems - Per HPI.   SH: Patient walks daily. PMH: COPD Osteoarthritis knees and left hip    Objective:  Physical Exam BP 149/67  Ht 5\' 4"  (1.626 m)  Wt 130 lb (58.968 kg)  BMI 22.30 kg/m2  LMP 02/23/1985 GEN: NAD, pleasant EXTR:   Knees: Bilateral knees with bony enlargement/chronic changes but no redness, inflammation, or heat. Tender just above middle of left lateral joint line. Mild popliteal fossa tenderness with no erythema or warmth 4+/5 knee flexion and extension with some reproduced pain with extension against resistance. 15 degrees of limit to knee extension bilaterally Moderate knee crepitus on extension and flexion bilaterally  Hip: Left hip with mild lateral tenderness in gluteus 5/5 hip flexion and abduction on left Normal gait  MSK [place]:   Left knee: Lateral meniscus present but with crack verticallydown length of  meniscus Minimal to no medial meniscus present. Bony structure changes noted with bone spur seen and calcified meniscal fragments medially No hypoechoic areas seen. No real effusion    Assessment:     Miranda Thompson is a 80 y.o. female with h/o hip and knee osteoarthritis here for new left knee and hip pain.    Plan:     Left knee pain H/o osteoarthritis bilateral knees. Left lateral joint line tenderness and US showing meniscal tear. No signs of infection/inflammation in knee. - Continue to wear compression sleeve at knee when able. - Heat therapy BID or more if needed. - Ice to anterior knee if swelling develops.  Left hip pain Likely overuse due to knee injury. Strength and stability intact on exam. - Heat therapy. - Continue to walk daily.    Hilton Sinclair, MD [place]   Reviewed/ editied by Ila Mcgill, MD

## 2013-08-08 ENCOUNTER — Ambulatory Visit (INDEPENDENT_AMBULATORY_CARE_PROVIDER_SITE_OTHER)
Admission: RE | Admit: 2013-08-08 | Discharge: 2013-08-08 | Disposition: A | Discharge status: Home / Self Care | Payer: Medicare Other | Source: Ambulatory Visit | Attending: Adult Health | Admitting: Adult Health

## 2013-08-08 ENCOUNTER — Ambulatory Visit (INDEPENDENT_AMBULATORY_CARE_PROVIDER_SITE_OTHER): Payer: Medicare Other | Admitting: Adult Health

## 2013-08-08 ENCOUNTER — Encounter: Payer: Self-pay | Admitting: Adult Health

## 2013-08-08 VITALS — BP 104/58 | HR 91 | Temp 97.5°F | Ht 64.0 in | Wt 131.6 lb

## 2013-08-08 DIAGNOSIS — J471 Bronchiectasis with (acute) exacerbation: Secondary | ICD-10-CM

## 2013-08-08 MED ORDER — LEVOFLOXACIN 750 MG PO TABS: 750.0000 mg | ORAL_TABLET | Freq: Every day | ORAL | Status: AC

## 2013-08-08 MED ORDER — LEVALBUTEROL HCL 0.63 MG/3ML IN NEBU
0.6300 mg | INHALATION_SOLUTION | Freq: Once | RESPIRATORY_TRACT | Status: AC
  Administered 2013-08-08: 0.63 mg via RESPIRATORY_TRACT

## 2013-08-08 NOTE — Addendum Note (Signed)
Addended by: Parke Poisson E on: 08/08/2013 03:42 PM   Modules accepted: Orders

## 2013-08-08 NOTE — Progress Notes (Signed)
Quick Note:  Spoke with pt's husband Thayer Jew (DPR on file) to relay results and recs. He understands, will relay these results to the pt. Nothing further needed at this time. ______

## 2013-08-08 NOTE — Patient Instructions (Signed)
LEVAQUIN 750mg  to take one per day for 10 days-take w/ food  Mucinex Twice daily  As needed  Cough/congestion  Fluids and rest  Flutter valve several times daily .  Follow up Dr. [name]  As planned and As needed   Please contact office for sooner follow up if symptoms do not improve or worsen or seek emergency care

## 2013-08-08 NOTE — Assessment & Plan Note (Signed)
Flare  Check cxr   Plan  LEVAQUIN 750mg  to take one per day for 10 days-take w/ food  Mucinex Twice daily  As needed  Cough/congestion  Fluids and rest  Flutter valve several times daily .  Follow up Dr. [name]  As planned and As needed   Please contact office for sooner follow up if symptoms do not improve or worsen or seek emergency care

## 2013-08-08 NOTE — Addendum Note (Signed)
Addended by: Lorane Gell on: 08/08/2013 03:42 PM   Modules accepted: Orders

## 2013-08-08 NOTE — Progress Notes (Signed)
Patient ID: Miranda Thompson, female   DOB: 1933/04/06, 80 y.o.   MRN: [id number]  Subjective:    Patient ID: Miranda Thompson, female    DOB: [birth year], 80 y.o.   MRN: [id number]  HPI 80y/o WF    08/08/2013 Acute OV  Complains of  prod cough with thick green/yellow/clear mucus, tightness, increased SOB, wheezing, low grade temp at onset x2 weeks.   Denies hemoptysis, nausea, vomiting, PND, leg swelling Last abx >1.5 yr ago.  She denies any hemoptysis, chest pain, orthopnea, PND, or leg swelling.  Sputum C&S 4/14 showed Pseudomonas sens to Cefepime, Fortaz, Cipro, Levaquin, etc    Problem List:     ALLERGIC RHINITIS (ICD-477.9) - she uses OTC antihistamines Prn.  BRONCHIECTASIS (ICD-494.0) & Hx of PNEUMONIA (ICD-486) - on ADVAIR 250 Bid, & MUCINEX 1-2Bid...  Long hx severe bronchiectasis, granulomatous lung dis, and pneumonia (initially Dx 1968 by DrFrazier, subseq bx= noncaseating gran dz & chr inflam)... her last bronchoscopy was 4/04 revealing some mucus plugging... CTChest 4/04 showed marked bronchiectasis in lingular, LLL, RML, and mucus plugging... CT repeated 6/06- similar. ~  Sputum cultures 5/08 grew Serratia (R to amox/ceph, S to all others), branching part acid fast bacilli resembling nocardia, yeast c/w candida... ~  CXR 1/09 chr lung dis, no change from prev w/ left mid lung zone as the worst area... ~  CXR 3/10 w/ chr lung changes and NAD suspected... ~  Sputum 8/10 showed NTF only & AFB smears & cult all neg... ~  CXR 3/11 showed chr lung dis, no acute changes, stable... ~  CXR 3/12 showed chronic changes, scarring, NAD.Marland Kitchen. ~  CXR 3/13 showed essentially no change> normal heart size, prom pulm arts, bilat interstitial prominence & nodularity, bronchiectasis, NAD, surg clips from prev left breast ca surg. ~  9/13: she presents w/ an acute exac w/ cough, thick sput, congestion, fever, SOB, & chest discomfort; CXR shows worse left mid zone opacity; We decided to treat w/ empiric  Levaquin x10d pending cult report==> ret w/ resist Pseudomonas, but pt states better/ improved on the Levaquin. ~  Sputum C&S 4/14 showed Pseudomonas sens to Cefepime, Fortaz, Cipro, Levaquin, etc  HYPERCHOLESTEROLEMIA, BORDERLINE (ICD-272.4) - on diet alone... ~  FLP 9/10 showed TChol 174, TG 64, HDL 49, LDL 112 ~  FLP 9/11 showed TChol 175, TG 61, HDL 48, LDL 115 ~  FLP 3/13 showed TChol 176, TG 69, HDL 64, LDL 98 ~  FLP 9/13 showed TChol 169, TG 65, HDL 48, LDL 108 ~  FLP 3/14 showed TChol 157, TG 54, HDL 46, LDL 100  Hx of ADENOCARCINOMA, BREAST (ICD-174.9) - S/P surgery 5/94 [facility] w/ wide excision L breast lesion and LN dissection (stage II 1.0  cm tumor), w/ post op XRT and chemotherapy... she was followed by [facility] for oncology... then DrLivesay (released in 2003). ~  routine Mammogram 2/10 was neg- f/u planned 80yr ~  f/u mammogram 2/11 = neg, NAD, f/u planned 134yr~  f/u mammogram 2/12 = neg, NAD, f/u 1y104yr  f/u mammogram 2/13 = neg, NAD... Marland Kitchen  f/u mammogram 2/14 = neg, scat parenchymal densities...  DIVERTICULOSIS OF COLON (ICD-562.10) - colonoscopy 3/05 by DrPatterson showed divertics only... she has +fam hx colon ca w/ colon check [id number] f/u colonoscopy 5/11 showed +divertics, no polyps... ~  3/12:  Hx, exam & CTAbd c/w diverticulitis; treated w/ Cipro/ Flagyl & resolved;  Advised Metamucil, Miralax, etc. ~  11/12:  She had f/u DrPatterson after another  bout of diverticulitis; he notes regular BMs, good appetite, stable weight; he wanted her to STOP Naprosyn, ok to use Tylenol/ Tramadol, rec hi fiber, saw the [place] movie.  DEGENERATIVE JOINT DISEASE (ICD-715.90) - see Ortho eval 2010 by DrDaldorf... she takes Glucosamine & Herbal supplements- milk thistle/ tumeric which she says really helps... 4/14: she saw DrKarlFields for SportsMed> c/o bilat knee pain, known arthritis, uses body helix knee (compression) sleeves & Tramadol prn; takes tart red cherry oil daily & he rec  devil's claw, tumeric, & curcumin...  LOW BACK PAIN SYNDROME (ICD-724.2) - LBP and eval by [facility] 2010 w/ MRI showed herniated disc L3-4 w/ mod spinal stenosis and lat recess stenosis bilat...   ROTATOR CUFF SYNDROME, RIGHT (ICD-726.10) - had injection by [facility] 10/07 w/ improvement.  OSTEOPENIA (ICD-733.90) - last BMD [facility] 10/06 showed norm spine, norm hip, osteopenic forearm (-2.0)... followed by GYN = DrRomaine who is treating a low Vit D level... she takes calcium, Vitamins, & Vit D 1000u daily from [place]... ~  labs 9/10 on 50K weekly showed Vit D level = 42... rec> change to 1000 u daily. ~  labs 9/11 on 1000 u daily showed Vit D level = 34... rec> continue daily supplement. ~  Labs 3/13 showed Vit D level = 35... Continue supplements. ~  Labs 3/14 showed Vit D level = 31  Hx of ANEMIA (ICD-285.9) -  resolved... ~  labs 9/10 showed Hg= 13.6 ~  labs 9/11 showed Hg= 13.2 ~  Labs 3/13 showed Hg= 13.4  ECZEMA - she has hx of eczema on her hands and has used CoQ10 and Borage Oil as rec by an Administrator in [place]... recently changed to [facility] Current instead of these other supplements & rash is much improved...   Past Surgical History  Procedure Laterality Date  . Lung biopsy  1968    TSurg---Dr. [name]  . Left breast lumpectomy and ln dissection  06/1992    Dr. [name]  . Tonsillectomy      Outpatient Encounter Prescriptions as of 08/08/2013  Medication Sig  . acetaminophen (TYLENOL) 500 MG tablet Take 500 mg by mouth every 6 (six) hours as needed for pain.  Marland Kitchen aspirin 81 MG tablet Take 81 mg by mouth daily.    Marland Kitchen atenolol (TENORMIN) 25 MG tablet Take 1 tablet (25 mg total) by mouth daily.  Marland Kitchen BLACK CURRANT SEED OIL PO Take 1 tablet by mouth daily.    . Estradiol 10 MCG TABS vaginal tablet Place 1 tablet (10 mcg total) vaginally 2 (two) times a week.  . Fluticasone-Salmeterol (ADVAIR DISKUS) 250-50 MCG/DOSE AEPB INHALE 1 PUFF ONCE A DAY  . glucosamine-chondroitin  500-400 MG tablet Take 1 tablet by mouth daily.    Marland Kitchen guaiFENesin (MUCINEX) 600 MG 12 hr tablet Take 1,200 mg by mouth 2 (two) times daily.  . Multiple Vitamins-Minerals (MULTIVITAMIN & MINERAL PO) Take 1 tablet by mouth daily.    Marland Kitchen OVER THE COUNTER MEDICATION 600 mg. Calcium Daily  . Probiotic Product (ALIGN PO) Take by mouth.    No Known Allergies   Current Medications, Allergies, Past Medical History, Past Surgical History, Family History, and Social History were reviewed in [employer] record.     Review of Systems         See HPI - all other systems neg except as noted...  The patient complains of dyspnea on exertion.  The patient denies anorexia, fever, weight loss, weight gain, vision loss, decreased hearing, hoarseness,  chest pain, syncope, peripheral edema, prolonged cough, headaches, hemoptysis, melena, hematochezia, severe indigestion/heartburn, hematuria, incontinence, muscle weakness, suspicious skin lesions, transient blindness, difficulty walking, depression, unusual weight change, abnormal bleeding, enlarged lymph nodes, and angioedema.     Objective:   Physical Exam     WD, WN, 80 y/o WF in NAD... GENERAL:  Alert & oriented; pleasant & cooperative... HEENT:  [place]/AT, EOM-wnl, PERRLA, EACs-clear, TMs-wnl, NOSE-clear, THROAT-clear & wnl. NECK:  Supple w/ fairROM; no JVD; normal carotid impulses w/o bruits; no thyromegaly or nodules palpated; no lymphadenopathy. CHEST:  scat bilat rhonchi without wheezing, rales or signs of consolidation... HEART:  Regular Rhythm; without murmurs/ rubs/ or gallops heard... ABDOMEN:  Soft/NT , BS +  no organomegaly or masses detected. EXT: without deformities or arthritic changes; no varicose veins/ venous insuffic/ or edema. NEURO: no focal neuro deficits... DERM:  No lesions noted; no rash etc...     Assessment & Plan:

## 2013-11-08 ENCOUNTER — Ambulatory Visit (INDEPENDENT_AMBULATORY_CARE_PROVIDER_SITE_OTHER): Payer: Medicare Other | Admitting: Pulmonary Disease

## 2013-11-08 ENCOUNTER — Encounter: Payer: Self-pay | Admitting: Pulmonary Disease

## 2013-11-08 VITALS — BP 102/60 | HR 71 | Temp 97.4°F | Ht 64.0 in | Wt 134.2 lb

## 2013-11-08 DIAGNOSIS — M545 Low back pain, unspecified: Secondary | ICD-10-CM

## 2013-11-08 DIAGNOSIS — M899 Disorder of bone, unspecified: Secondary | ICD-10-CM

## 2013-11-08 DIAGNOSIS — Z23 Encounter for immunization: Secondary | ICD-10-CM

## 2013-11-08 DIAGNOSIS — M25561 Pain in right knee: Secondary | ICD-10-CM

## 2013-11-08 DIAGNOSIS — K5730 Diverticulosis of large intestine without perforation or abscess without bleeding: Secondary | ICD-10-CM

## 2013-11-08 DIAGNOSIS — M25562 Pain in left knee: Secondary | ICD-10-CM

## 2013-11-08 DIAGNOSIS — M25569 Pain in unspecified knee: Secondary | ICD-10-CM

## 2013-11-08 DIAGNOSIS — J479 Bronchiectasis, uncomplicated: Secondary | ICD-10-CM

## 2013-11-08 DIAGNOSIS — I491 Atrial premature depolarization: Secondary | ICD-10-CM

## 2013-11-08 DIAGNOSIS — C50919 Malignant neoplasm of unspecified site of unspecified female breast: Secondary | ICD-10-CM

## 2013-11-08 DIAGNOSIS — E785 Hyperlipidemia, unspecified: Secondary | ICD-10-CM

## 2013-11-08 DIAGNOSIS — M949 Disorder of cartilage, unspecified: Secondary | ICD-10-CM

## 2013-11-08 DIAGNOSIS — M1990 Unspecified osteoarthritis, unspecified site: Secondary | ICD-10-CM

## 2013-11-08 MED ORDER — FLUTICASONE-SALMETEROL 250-50 MCG/DOSE IN AEPB: INHALATION_SPRAY | RESPIRATORY_TRACT | Status: DC

## 2013-11-08 NOTE — Patient Instructions (Signed)
Today we updated your med list in our EPIC system...    Continue your current medications the same...  Today we checked your pulmonary function and an ambulatory oxygen test...  Be sure to take your Advair & Mucinex regularly...    Continue your postural drainage regimen...  We gave you the 2015 flu vaccine today...  Call for any questions...  Let's plan a follow up visit in 29mo, sooner if needed for problems.Marland KitchenMarland Kitchen

## 2013-11-08 NOTE — Progress Notes (Signed)
Patient ID: Miranda Thompson, female   DOB: 03/31/1933, 80 y.o.   MRN: [id number]  Subjective:    Patient ID: Miranda Thompson, female    DOB: Jul 22, 1933, 80 y.o.   MRN: [id number]  HPI 80 y/o WF here for a follow up visit... she has mult med problems as noted below...   ~  May 05, 2010:  12moROV & presents w/ a "rough 3-4 weeks"> abd pain LLQ & across lower abd, no ch in bowels & no blood seen, [place], given Cipro 2/22-29 but still febrile, still in pain so DrRomaine gave her another round of Cipro 3/7==> ;  exam shows LLQ tender w/o guarding or rebound & all c/w sigm diverticulitis >> suggested CT Abd & adding FLAGYL + low residue diet to the regimen, ?f/u w/ GI pending CT results.    She has mod severe underlying bronchiectasis & granulomatous lung dis> stable on her regimen of Advair, Mucinex, chestPT/ post drainage;  due for her yearly f/u CXR today = chr pulm scarring, NAD;  Labs showed Chems OK x Na 131, CBC OK w/ Hg 12.8, WBC 7.2, Sed 83...     [facility].3 [facility] Spine, -0.8 in left FemNeck, & -2.1 right forearm (improved in spine but worse in hip & forearm);  treated by DrRomaine on Calcium, MVI, Vit D 1000, and numerous herbs...  ~  November 10, 2010:  68moOV & she continues stable overall; [place];  Prev bout of diverticulitis resolved w/ cipro & Flagyl and back to baseline;  She continues her daily postural drain w/ copious sputum production (no recent cultures) & he same bronchodilator regimen; no new complaints or concerns...  ~  May 11, 2011:  20m39moV & Miranda Thompson notes some incr fatigue, dry cough, right sided chest discomfort, & pulse in the 90's;  She takes her Advair250, GFN, & does her chest treatments regularly; she also takes a number of herbs that she believes are beneficial;  She had f/u OV w/ DrPatterson  11/12 after another bout of diverticulitis in Oct... See prob list below>>  CXR 3/13 showed essentially no change> normal heart size, prom pulm arts, bilat interstitial prominence & nodularity, bronchiectasis, NAD, surg clips from prev left breast ca surg.  LABS 3/13:  FLP- at goals on Diet;  Chems- wnl;  CBC- wnl;  TSH=3.63;  VitD=35.  ~  November 02, 2011:  20mo70mo> Miranda Thompson for early add-on visit due to recent incr in cough, chest congestion, thick discolored mucus w/ hemoptysis; she had temp at home to 103 she says & brought this down w/ Tylenol; she has assoc fatigue, SOB, chest pain, & headache; she has a long hx of known bronchiectasis, granulomatous lung dis, & recurrent pneumonias; CXR today shows more consolidated left mid zone opacity (her worst area of bronchiectasis); her only home meds are ADVAIR250Bid & Mucinex, she takes a lot of herbal supplements... We decided to treat this exac w/ LEVAQUIN x10d...  LABS 9/13:  FLP (ok x LDL=108);  BMET (wnl);  Sed (elev at 56) 2SPUTUM C&S:  PSEUDOMONAS resist to Cipro/ Levaquin/ Maxipeme; sens to Fortaz/ Zosyn/ Imipenem; NOTE> she is already improved after finishing empiric Levaquin & we will continue to follow...  ~  May 02, 2012:  20mo 720mo& Miranda Thompson  has been stable w/ her bronchiectasis & regular cough, sputum, intermittent hemoptysis, etc... She does a great job w/ chestPT, postural drainage, and takes Mucinex 1200Bid, Fluids, & Advair250... Last sputum 1013 grew Pseudomonas resistant to Cipro/ Levaquin/ Cefipeme/ Genta (we will re-culture to check current sens panel)... She reports recent Cipro x2 for UTIs, breathing is fair, walking some but decr activ due to arthritis (knees hips) attended by DrFields w/ shots given & braces to use (she notes that cherry juice really helps)...     We reviewed prob list, meds, xrays and labs> see below for updates >> she wants Levaquin 753m/d x10d for prn use...  LABS 3/14:  FLP- at goals on diet  alone;  Chems- wnl;  CBC- wnl;  TSH=2.37;  VitD=31;  UA- clear...  SPUTUM 4/14 >> Pseudomonas sensitive to all antibiotics   ~  November 10, 2012:  614moOV & MaMacils about the same- feels tired all the time, chr cough w/ occas hemoptysis (streaky phlegm), no bad episodes; she has known Bronchiectasis on Advair 250Bid & Mucinex600-2Bid w/ fluids; tried on cycic antibiotics in the past but she never felt they really helped & she prefers Prn Cipro/ Levaquin for acute superimposed infectious exacerbations; Last CXR 9/13 showed scarring LUL, chr changes left mid-lung, s/p left mastectomy & axillary node dissection, etc;  Last Sputum C&S 4/14- Pseudomonas sens to Cefepime, Fortaz, Cipro, Levaquin, etc;  She does chest PT & postural drainage daily... She takes a number of supplements including Organic apple cider vinegar daily...    We reviewed prob list, meds, xrays and labs> see below for updates >> OK 2014 Flu vaccine...  LABS 9/14:  CBC- ok w/ Hg=13.7, WBC=6.4;  Fe=91 (25%sat)... BMet wasn't done...   ~  May 10, 2013:  80m79moV & Miranda Thompson c/o HBP, chest pressure, and tachycardia w/ pulse=157 recently; we discussed the need for Cardiac eval & we will refer...     Bronchiectasis & HxPneumonia> on Advair250Bid (not taking regularly) & GFN 1200Bid; mod cough, grey colored sput, intermit hemoptysis, denies f/c/s; last pos culture was 4/14= Pseudomonas, pan-sens & treated w/ Cipro...    CHOL> on diet alone; FLP 3/15 showed TChol 172, TG 50, HDL 56, LDL 106    GI- Divertics> asked to take Align, Metamucil, etc; she had diverticulitis flair treated w/ Cipro & Flagyl    GU- HxUTI w/ pelvic pressure, back pain, freq&urgency> felt to have a chronic cystitis by DrEskridge & she understands asymptomatic bactiuria...    Hx Breast Cancer> she had surg 1994 w/ lumpectomy & LN dissection followed by XRT & Chemorx; [facility]...    DJD, LBP, Osteopenia> on Tylenol, Estradiol topically  per gyn, calcium, MVI, etc; she walks >1mi35mfor exercise; followed by DrFields regularly; [facility]...    HxEczema> she uses CoQ1`0 & Borage Oil; she likes herbal remedies...  We reviewed prob list, meds, xrays and labs> see below for updates >>   CXR 3/15 showed stable film w/ scarring LUL, no change and surg clips left breast & axilla  EKG 3/15 showed NSR w/ PACs and atrial bigem, 1st degree AVB, otherw NAD...   LABS 3/15:  FLP- ok on diet alone;  Chems- wnl;  CBC- wnl;  Fe=69 (19%sat);  VitD=49;  TSH=4.33... UA is clear, wnl...   ~  November 08, 2013:  80mo 52mo& Miranda Thompson had an exac 6/15- seen by TP, CXR stable (NAD), treated w/  Levaquin/ Mucinex & improved, but she stopped her Advair & now notes sl incr SOB;  PFT w/ FEV1=1.22 (61%) & she is rec to restart her inhaler;  Notes chr cough w/ green-gray phlegm from her severe bronchiectasis (last pos sputum was 4/14 +Pseudomonas- pansensitive);  EXAM shows bilat rhonchi, no consolidation, and she is stable on her regular regimen and vigorous chest physiotherapy regimen to aide sputum production...     She had f/u DrNishan 4/15> hx palpit, bigeminy on EKG, improved on Aten25; no changes made...    She continues to follow up w/ DrFields> right knee pain, wears brace & gets shots periodically, his notes are reviewed...  We reviewed prob list, meds, xrays and labs> see below for updates >> OK 2015 Flu vaccine today...   PFT 9/15 showed FVC= 2.08 (77%), FEV1= 1.22 (61%), %1sec= 59%, mid-flows= 31% predicted... c/w GOLD Stage2 COPD  Ambulatory O2 Test>  O2sat on RA at rest= 96% w/ pulse=73/min;  Ambulated 3 laps w/ lowest O2sat= 96% w/ pulse=94/min.... PLAN>> continue same Advair250Bid, Mucinex 1200Bid, Fluids, vigorous home chest PT & postural drainage, stay as active as poss...           Problem List:     ALLERGIC RHINITIS (ICD-477.9) - she uses OTC antihistamines Prn.  BRONCHIECTASIS (ICD-494.0) & Hx of  PNEUMONIA (ICD-486) - on ADVAIR 250 Bid, & MUCINEX 1-2Bid...  Long hx severe bronchiectasis, granulomatous lung dis, and pneumonia (initially Dx 1968 by DrFrazier, subseq bx= noncaseating gran dz & chr inflam)... her last bronchoscopy was 4/04 revealing some mucus plugging... CTChest 4/04 showed marked bronchiectasis in lingular, LLL, RML, and mucus plugging... CT repeated 6/06- similar. ~  Sputum cultures 5/08 grew Serratia (R to amox/ceph, S to all others), branching part acid fast bacilli resembling nocardia, yeast c/w candida... ~  CXR 1/09 chr lung dis, no change from prev w/ left mid lung zone as the worst area... ~  CXR 3/10 w/ chr lung changes and NAD suspected... ~  Sputum 8/10 showed NTF only & AFB smears & cult all neg... ~  CXR 3/11 showed chr lung dis, no acute changes, stable... ~  CXR 3/12 showed chronic changes, scarring, NAD.Marland Kitchen. ~  CXR 3/13 showed essentially no change> normal heart size, prom pulm arts, bilat interstitial prominence & nodularity, bronchiectasis, NAD, surg clips from prev left breast ca surg. ~  9/13: she presents w/ an acute exac w/ cough, thick sput, congestion, fever, SOB, & chest discomfort; CXR shows worse left mid zone opacity; We decided to treat w/ empiric Levaquin x10d pending cult report==> ret w/ resist Pseudomonas, but pt states better/ improved on the Levaquin. ~  Sputum C&S 4/14 showed Pseudomonas sens to Cefepime, Fortaz, Cipro, Levaquin, etc ~  3/15: on Advair250Bid (not taking regularly) & GFN 1200Bid; mod cough, grey colored sput, intermit hemoptysis, denies f/c/s; last pos culture was 4/14= Pseudomonas, pan-sens & treated w/ Cipro.  PALPITATIONS >> followed by Miranda Thompson on ATENOLOL25 & improved... ~  2DEcho 3/15 showed norm LV size & function w/ EF=55-60% and no regional wall motion abn, mild MR, mild TR, PAsys=45mHg...  HYPERCHOLESTEROLEMIA, BORDERLINE (ICD-272.4) - on diet alone... ~  FLP 9/10 showed TChol 174, TG 64, HDL 49, LDL 112 ~  FLP 9/11  showed TChol 175, TG 61, HDL 48, LDL 115 ~  FLP 3/13 showed TChol 176, TG 69, HDL 64, LDL 98 ~  FLP 9/13 showed TChol 169, TG 65, HDL 48, LDL 108 ~  FLP 3/14 showed TChol 157, TG 54,  HDL 46, LDL 100 ~  FLP 3/15 showed TChol 172, TG 50, HDL 56, LDL 106  Hx of ADENOCARCINOMA, BREAST (ICD-174.9) - S/P surgery 5/94 [facility] w/ wide excision L breast lesion and LN dissection (stage II 1.0  cm tumor), w/ post op XRT and chemotherapy... she was [facility] by [facility] for [facility]... then DrLivesay (released in 2003). ~  routine Mammogram 2/10 was neg- f/u planned 75yr ~  f/u mammogram 2/11 = neg, NAD, f/u planned 168yr~  f/u mammogram 2/12 = neg, NAD, f/u 1y47yr  f/u mammogram 2/13 = neg, NAD... Marland Kitchen  f/u mammogram 2/14 = neg, scat parenchymal densities... ~  f/u mammogram 2/15 at [facility] = neg, NAD...  DIVERTICULOSIS OF COLON (ICD-562.10) - colonoscopy 3/05 by DrPatterson showed divertics only... she has +fam hx colon ca w/ colon check q5yr26yr f/u colonoscopy 5/11 showed +divertics, no polyps... ~  3/12:  Hx, exam & CTAbd c/w diverticulitis; treated w/ Cipro/ Flagyl & resolved;  Advised Metamucil, Miralax, etc. ~  11/12:  She had f/u DrPatterson after another bout of diverticulitis; he notes regular BMs, good appetite, stable weight; he wanted her to STOP Naprosyn, ok to use Tylenol/ Tramadol, rec hi fiber, [place].  DEGENERATIVE JOINT DISEASE (ICD-715.90) - see Ortho eval 2010 by DrDaldorf... she takes Glucosamine & Herbal supplements- milk thistle/ tumeric which she says really helps... 4/14: she saw DrKarlFields for SportsMed> c/o bilat knee pain, known arthritis, uses body helix knee (compression) sleeves & Tramadol prn; takes tart red cherry oil daily & he rec devil's claw, tumeric, & curcumin...  LOW BACK PAIN SYNDROME (ICD-724.2) - [facility]/ [facility] showed herniated disc L3-4 w/ mod spinal stenosis and lat recess stenosis bilat...   ROTATOR CUFF SYNDROME, RIGHT  (ICD-726.10) - [facility] w/ improvement.  OSTEOPENIA (ICD-733.90) - [facility] showed [facility] spine, norm hip, osteopenic forearm (-2.0)... followed by GYN = DrRomaine who is treating a low Vit D level... she takes calcium, Vitamins, & Vit D 1000u daily from DrRoBerwick~  labs 9/10 on 50K weekly showed Vit D level = 42... rec> change to 1000 u daily. ~  labs 9/11 on 1000 u daily showed Vit D level = 34... rec> continue daily supplement. ~  Labs 3/13 showed Vit D level = 35... Continue supplements. ~  Labs 3/14 showed Vit D level = 31 ~  Labs 3/15 showed Vit D level = 49  Hx of ANEMIA (ICD-285.9) -  resolved... ~  labs 9/10 showed Hg= 13.6 ~  labs 9/11 showed Hg= 13.2 ~  Labs 3/13 showed Hg= 13.4 ~  Labs 3/14 showed Hg= 13.3 ~  Labs 3/15 showed Hg= 13.4  ECZEMA - she has hx of eczema on her hands and has used CoQ10 and Borage Oil as rec by an herbAdministratorPenn[place] changed to [facility] instead of these other supplements & rash is much improved...   Past Surgical History  Procedure Laterality Date  . Lung biopsy  1968    TSurg---Dr. FrazMare FerrariLeft breast lumpectomy and ln dissection  06/1992    Dr. NewmLucia GaskinsTonsillectomy      Outpatient Encounter Prescriptions as of 11/08/2013  Medication Sig  . acetaminophen (TYLENOL) 500 MG tablet Take 500 mg by mouth every 6 (six) hours as needed for pain.  . asMarland Kitchenirin 81 MG tablet Take 81 mg by mouth daily.    . atMarland Kitchennolol (TENORMIN) 25 MG  tablet Take 1 tablet (25 mg total) by mouth daily.  Marland Kitchen BLACK CURRANT SEED OIL PO Take 1 tablet by mouth daily.    . Estradiol 10 MCG TABS vaginal tablet Place 1 tablet (10 mcg total) vaginally 2 (two) times a week.  . Fluticasone-Salmeterol (ADVAIR DISKUS) 250-50 MCG/DOSE AEPB INHALE 1 PUFF ONCE A DAY  . glucosamine-chondroitin 500-400 MG tablet Take 1 tablet by mouth daily.    Marland Kitchen guaiFENesin (MUCINEX) 600 MG 12 hr tablet Take 1,200 mg by mouth 2 (two) times  daily.  . Multiple Vitamins-Minerals (MULTIVITAMIN & MINERAL PO) Take 1 tablet by mouth daily.    Marland Kitchen OVER THE COUNTER MEDICATION 600 mg. Calcium Daily  . Probiotic Product (ALIGN PO) Take by mouth.    No Known Allergies   Current Medications, Allergies, Past Medical History, Past Surgical History, Family History, and Social History were reviewed in [employer] record.     Review of Systems         See HPI - all other systems neg except as noted...  The patient complains of dyspnea on exertion and abdominal pain.  The patient denies anorexia, fever, weight loss, weight gain, vision loss, decreased hearing, hoarseness, chest pain, syncope, peripheral edema, prolonged cough, headaches, hemoptysis, melena, hematochezia, severe indigestion/heartburn, hematuria, incontinence, muscle weakness, suspicious skin lesions, transient blindness, difficulty walking, depression, unusual weight change, abnormal bleeding, enlarged lymph nodes, and angioedema.     Objective:   Physical Exam     WD, WN, 80 y/o WF in NAD... GENERAL:  Alert & oriented; pleasant & cooperative... HEENT:  [place]/AT, EOM-wnl, PERRLA, EACs-clear, TMs-wnl, NOSE-clear, THROAT-clear & wnl. NECK:  Supple w/ fairROM; no JVD; normal carotid impulses w/o bruits; no thyromegaly or nodules palpated; no lymphadenopathy. CHEST:  scat bilat rhonchi without wheezing, rales or signs of consolidation... HEART:  Regular Rhythm; without murmurs/ rubs/ or gallops heard... ABDOMEN:  Soft w/ mild tender LLQ; normal bowel sounds; no organomegaly or masses detected. EXT: without deformities or arthritic changes; no varicose veins/ venous insuffic/ or edema. NEURO:  CN's intact;  no focal neuro deficits... DERM:  No lesions noted; no rash etc...  RADIOLOGY DATA:  Reviewed in the EPIC EMR & discussed w/ the patient...  LABORATORY DATA:  Reviewed in the EPIC EMR & discussed w/ the patient...    Assessment & Plan:    COPD/  Bronchiectasis/ Hx pneumonia>  On Advair250, Mucinex2Bid, Fluids & vigorous Chest PT/ postural drainage; sputum for C&S; 9/13> treating acute exac w/ Levaquin x10d; Cult grew a resist Pseudomonas but she reports clinically improved, therefore continue present Rx & follow... Repeat Sput C&S 4/14 showed pansens Pseudomonas...  CHOL>  On diet alone & f/u FLP is at goal...  Breast Cancer>  S/p surg [facility] & oncology f/u by DrNeijstrom, then [facility] 2003 & released; yearly mammograms & monthly self exams were wnl...  Divertics>  She had acute diverticulitis 3/12 & 10/12> resolved w/ diet adjust & Cipro/Flagyl, now back to baseline & advised re- Metamucil, Fiberall, etc; she saw DrPatterson 11/12.  DJD/ LBP>  Ortho is DrDaldorf & DrFields; she takes Glucosamine & Herbs and doing satis she says...  Osteopenia/ Vit D defic>  On Calcium, MVI, Vit D supplement; followed by GYN= DrRomaine...  Other medical issues as noted...    Patient's Medications  New Prescriptions   No medications on file  Previous Medications   ACETAMINOPHEN (TYLENOL) 500 MG TABLET    Take 500 mg by mouth every 6 (six)  hours as needed for pain.   ASPIRIN 81 MG TABLET    Take 81 mg by mouth daily.     ATENOLOL (TENORMIN) 25 MG TABLET    Take 1 tablet (25 mg total) by mouth daily.   ESTRADIOL 10 MCG TABS VAGINAL TABLET    Place 1 tablet (10 mcg total) vaginally 2 (two) times a week.   GLUCOSAMINE-CHONDROITIN 500-400 MG TABLET    Take 1 tablet by mouth daily.     GUAIFENESIN (MUCINEX) 600 MG 12 HR TABLET    Take 1,200 mg by mouth 2 (two) times daily.   MULTIPLE VITAMINS-MINERALS (MULTIVITAMIN & MINERAL PO)    Take 1 tablet by mouth daily.     OVER THE COUNTER MEDICATION    600 mg. Calcium Daily   PROBIOTIC PRODUCT (ALIGN PO)    Take by mouth.  Modified Medications   Modified Medication Previous Medication   FLUTICASONE-SALMETEROL (ADVAIR DISKUS) 250-50 MCG/DOSE AEPB Fluticasone-Salmeterol (ADVAIR DISKUS) 250-50  MCG/DOSE AEPB      INHALE 1 PUFF ONCE A DAY    INHALE 1 PUFF ONCE A DAY  Discontinued Medications   BLACK CURRANT SEED OIL PO    Take 1 tablet by mouth daily.

## 2013-11-12 NOTE — Addendum Note (Signed)
Addended by: Noralee Space on: 11/12/2013 01:31 PM   Modules accepted: Level of Service

## 2013-11-14 ENCOUNTER — Encounter: Payer: Self-pay | Admitting: Pulmonary Disease

## 2013-11-29 ENCOUNTER — Ambulatory Visit (INDEPENDENT_AMBULATORY_CARE_PROVIDER_SITE_OTHER): Payer: Medicare Other | Admitting: Cardiovascular Disease

## 2013-11-29 ENCOUNTER — Encounter: Payer: Self-pay | Admitting: Cardiovascular Disease

## 2013-11-29 VITALS — BP 116/68 | HR 76 | Ht 64.0 in | Wt 134.0 lb

## 2013-11-29 DIAGNOSIS — J479 Bronchiectasis, uncomplicated: Secondary | ICD-10-CM

## 2013-11-29 DIAGNOSIS — E785 Hyperlipidemia, unspecified: Secondary | ICD-10-CM

## 2013-11-29 DIAGNOSIS — I498 Other specified cardiac arrhythmias: Secondary | ICD-10-CM

## 2013-11-29 NOTE — Patient Instructions (Signed)
Your physician wants you to follow-up in:  6 MONTHS WITH DR NISHAN  You will receive a reminder letter in the mail two months in advance. If you don't receive a letter, please call our office to schedule the follow-up appointment. Your physician recommends that you continue on your current medications as directed. Please refer to the Current Medication list given to you today. 

## 2013-11-29 NOTE — Progress Notes (Signed)
Patient ID: Miranda Thompson, female   DOB: 15-Dec-1933, 80 y.o.   MRN: [id number] 80 yo referred by Dr [name] for palpitations. Has bad chronic lung disease with bronchiectasis Has had long standing skips but seem to be worse lately with atrial bigeminy seen on office ECG recently. At one time BP monitor showed pulse of 157 and patient felt a bit dizzy No syncope She does not wear oxygen Echo 2 years ago showed good LV and no pulmonary hypertension. She feels occasional flutterin in chest and senses that her HR is always high in 90's She does have some wheezing No chest pain chronic mild dyspnea  No sputum or fever Labs reviewed and TSH and HCT ok this month No history of MAT or PAF No bleeding diathesis  Started on Atenolol 3/25 Feels much better with BP and HR readings at home good  Echo ok with normal EF 3/27  Study Conclusions  - Left ventricle: The cavity size was normal. Systolic function was normal. The estimated ejection fraction was in the range of 55% to 60%. Wall motion was normal; there were no regional wall motion abnormalities. Doppler parameters are consistent with mildly elevated ventricular end-diastolic filling pressure. - Aortic valve: Trileaflet; mildly thickened leaflets. Transvalvular velocity was within the normal range. There was no stenosis. No regurgitation. - Aortic root: The aortic root was normal in size. - Mitral valve: Mildly thickened leaflets . Mild regurgitation. - Right ventricle: The cavity size was mildly dilated. Wall thickness was normal. Systolic function was normal. - Right atrium: The atrium was mildly dilated. - Atrial septum: No defect or patent foramen ovale was identified. - Tricuspid valve: Mild-moderate regurgitation. - Pulmonic valve: Mild regurgitation. - Pulmonary arteries: Systolic pressure was within the normal range. PA peak pressure: 13mm Hg (S).  No complaints has had flu shot no recent lung infections and no palpitations     ROS:  Denies fever, malais, weight loss, blurry vision, decreased visual acuity, cough, sputum, SOB, hemoptysis, pleuritic pain, palpitaitons, heartburn, abdominal pain, melena, lower extremity edema, claudication, or rash.  All other systems reviewed and negative  General: Affect appropriate Frail elderly female  HEENT: normal Neck supple with no adenopathy JVP normal no bruits no thyromegaly Lungs clear with no wheezing and good diaphragmatic motion Heart:  S1/S2 no murmur, no rub, gallop or click PMI normal Abdomen: benighn, BS positve, no tenderness, no AAA no bruit.  No HSM or HJR Distal pulses intact with no bruits No edema Neuro non-focal Skin warm and dry No muscular weakness   Current Outpatient Prescriptions  Medication Sig Dispense Refill  . acetaminophen (TYLENOL) 500 MG tablet Take 500 mg by mouth every 6 (six) hours as needed for pain.      Marland Kitchen aspirin 81 MG tablet Take 81 mg by mouth daily.        Marland Kitchen atenolol (TENORMIN) 25 MG tablet Take 1 tablet (25 mg total) by mouth daily.  90 tablet  3  . Estradiol 10 MCG TABS vaginal tablet Place 1 tablet (10 mcg total) vaginally 2 (two) times a week.  24 tablet  4  . Fluticasone-Salmeterol (ADVAIR DISKUS) 250-50 MCG/DOSE AEPB INHALE 1 PUFF ONCE A DAY  60 each  6  . glucosamine-chondroitin 500-400 MG tablet Take 1 tablet by mouth daily.        Marland Kitchen guaiFENesin (MUCINEX) 600 MG 12 hr tablet Take 1,200 mg by mouth 2 (two) times daily.      . Multiple Vitamins-Minerals (MULTIVITAMIN & MINERAL PO)  Take 1 tablet by mouth daily.        Marland Kitchen OVER THE COUNTER MEDICATION 600 mg. Calcium Daily      . Probiotic Product (ALIGN PO) Take by mouth.       No current facility-administered medications for this visit.    Allergies  Review of patient's allergies indicates no known allergies.  Electrocardiogram:  04/30/13  SR atrial bigeminy normal ST segments  Assessment and Plan

## 2013-11-29 NOTE — Assessment & Plan Note (Signed)
Cholesterol is at goal.  Continue current dose of statin and diet Rx.  No myalgias or side effects.  F/U  LFT's in 6 months. Lab Results  Component Value Date   LDLCALC 106* 05/05/2013

## 2013-11-29 NOTE — Assessment & Plan Note (Signed)
Doing well no wheezing cough or fever  Flu shot given Has had pneumovax last 5 years F/U Dr [name]

## 2013-11-29 NOTE — Assessment & Plan Note (Signed)
Palpitations gone atrial bigeminy gone  Continue beta blocker

## 2013-12-05 NOTE — Telephone Encounter (Signed)
Please advise Dr [name]. Thanks.

## 2013-12-25 ENCOUNTER — Encounter: Payer: Self-pay | Admitting: Cardiovascular Disease

## 2014-01-29 ENCOUNTER — Ambulatory Visit (INDEPENDENT_AMBULATORY_CARE_PROVIDER_SITE_OTHER): Payer: Medicare Other | Admitting: Certified Nurse Midwife

## 2014-01-29 ENCOUNTER — Encounter: Payer: Self-pay | Admitting: Certified Nurse Midwife

## 2014-01-29 VITALS — BP 118/60 | HR 70 | Resp 16 | Ht 63.25 in | Wt 137.0 lb

## 2014-01-29 DIAGNOSIS — N952 Postmenopausal atrophic vaginitis: Secondary | ICD-10-CM

## 2014-01-29 DIAGNOSIS — Z01419 Encounter for gynecological examination (general) (routine) without abnormal findings: Secondary | ICD-10-CM

## 2014-01-29 MED ORDER — ESTRADIOL 10 MCG VA TABS: 1.0000 | ORAL_TABLET | VAGINAL | Status: DC

## 2014-01-29 NOTE — Progress Notes (Signed)
80 y.o. [id number] Married Caucasian Fe here for annual exam. Menopausal no HRT. Denies vaginal bleeding or vaginal dryness. Seeing PCP for medication for hypertension and lab. No diverticulitis flares over the past two years! Staying busy with family. Spouse doing well after prostate cancer treatment. Uses Olive oil for vaginal dryness also in addition to Vagifem. No other health issues today.  Patient's last menstrual period was 02/23/1985.          Sexually active: No.  The current method of family planning is vasectomy.    Exercising: Yes.    walking,weights & floor exercises Smoker:  no  Health Maintenance: Pap: 11-09-08 neg MMG:  04-17-13 category a, birads 2:neg Colonoscopy:  2011 due 2016 will schedule BMD:   2011  Due 2016 PCP management TDaP:  With pcp Labs: none Self breast exam: done occ   reports that she quit smoking about 46 years ago. Her smoking use included Cigarettes. She has a 10 pack-year smoking history. She has never used smokeless tobacco. She reports that she drinks about 0.6 oz of alcohol per week. She reports that she does not use illicit drugs.  Past Medical History  Diagnosis Date  . Allergic rhinitis   . Bronchiectasis with acute exacerbation   . Hemoptysis   . History of pneumonia   . Hypercholesteremia   . Diverticulosis of colon   . DJD (degenerative joint disease)   . Low back pain syndrome   . Rotator cuff syndrome of right shoulder   . Osteopenia   . Anemia   . Family history of malignant neoplasm of gastrointestinal tract   . Pseudomonas infection   . Breast cancer, left breast 1994 with lumpectomy    chemo,tamox  . Fibromyalgia   . Spinal stenosis     Past Surgical History  Procedure Laterality Date  . Lung biopsy  1968    TSurg---Dr. [name]  . Left breast lumpectomy and ln dissection  06/1992    Dr. [name]  . Tonsillectomy      Current Outpatient Prescriptions  Medication Sig Dispense Refill  . acetaminophen (TYLENOL) 500 MG tablet  Take 500 mg by mouth every 6 (six) hours as needed for pain.    Marland Kitchen aspirin 81 MG tablet Take 81 mg by mouth daily.      Marland Kitchen atenolol (TENORMIN) 25 MG tablet Take 1 tablet (25 mg total) by mouth daily. 90 tablet 3  . Estradiol 10 MCG TABS vaginal tablet Place 1 tablet (10 mcg total) vaginally 2 (two) times a week. 24 tablet 4  . Fluticasone-Salmeterol (ADVAIR DISKUS) 250-50 MCG/DOSE AEPB INHALE 1 PUFF ONCE A DAY 60 each 6  . glucosamine-chondroitin 500-400 MG tablet Take 1 tablet by mouth daily.      Marland Kitchen guaiFENesin (MUCINEX) 600 MG 12 hr tablet Take 1,200 mg by mouth 2 (two) times daily.    Marland Kitchen OVER THE COUNTER MEDICATION 600 mg. Calcium Daily    . Probiotic Product (ALIGN PO) Take by mouth.    . Multiple Vitamins-Minerals (MULTIVITAMIN & MINERAL PO) Take 1 tablet by mouth daily.       No current facility-administered medications for this visit.    Family History  Problem Relation Age of Onset  . Heart attack Mother     Deceased age 53  . Colon cancer Father     deceased age 50 from colon ca.  . Hypertension Brother     ROS:  Pertinent items are noted in HPI.  Otherwise, a comprehensive ROS was negative.  Exam:   BP 118/60 mmHg  Pulse 70  Resp 16  Ht 5' 3.25" (1.607 m)  Wt 137 lb (62.143 kg)  BMI 24.06 kg/m2  LMP 02/23/1985 Height: 5' 3.25" (160.7 cm)  Ht Readings from Last 3 Encounters:  01/29/14 5' 3.25" (1.607 m)  11/29/13 5\' 4"  (1.626 m)  11/08/13 5\' 4"  (1.626 m)    General appearance: alert, cooperative and appears stated age Head: Normocephalic, without obvious abnormality, atraumatic Neck: no adenopathy, supple, symmetrical, trachea midline and thyroid normal to inspection and palpation Lungs: clear to auscultation bilaterally Breasts: normal appearance, no masses or tenderness, No nipple retraction or dimpling, No nipple discharge or bleeding, No axillary or supraclavicular adenopathy Heart: regular rate and rhythm Abdomen: soft, non-tender; no masses,  no  organomegaly Extremities: extremities normal, atraumatic, no cyanosis or edema Skin: Skin color, texture, turgor normal. No rashes or lesions Lymph nodes: Cervical, supraclavicular, and axillary nodes normal. No abnormal inguinal nodes palpated Neurologic: Grossly normal   Pelvic: External genitalia:  no lesions              Urethra:  normal appearing urethra with no masses, tenderness or lesions              Bartholin's and Skene's: normal                 Vagina: atrophic appearing vagina with normal color and discharge, no lesions              Cervix: normal, non tender              Pap taken: No. Bimanual Exam:  Uterus:  normal size, contour, position, consistency, mobility, non-tender and anteverted              Adnexa: normal adnexa and no mass, fullness, tenderness               Rectovaginal: Confirms               Anus:  normal sphincter tone, no lesions  A:  Well Woman with normal exam  Menopausal no HRT  Atrophic vaginitis Vagifem working well desires continuance  Hypertension with PCP management  History of Diverticulitis good control now    P:   Reviewed health and wellness pertinent to exam  Aware of need to evaluate if vaginal bleeding  Rx Vagifem see order  Continue MD follow up as indicated  Pap smear not taken today   counseled on breast self exam, mammography screening, adequate intake of calcium and vitamin D, diet and exercise  return annually or prn  An After Visit Summary was printed and given to the patient.

## 2014-01-29 NOTE — Patient Instructions (Signed)

## 2014-01-31 NOTE — Progress Notes (Signed)
Reviewed personally.  M. Suzanne Sarahmarie Leavey, MD.  

## 2014-02-20 ENCOUNTER — Other Ambulatory Visit: Payer: Self-pay | Admitting: Certified Nurse Midwife

## 2014-02-20 NOTE — Telephone Encounter (Signed)
Medication refill request: Vagifem 10 mcg Last AEX:  01/29/14 Next AEX: 01/31/15 Last MMG (if hormonal medication request): 04/17/13 Bi-Rads Benign Refill authorized: New rx sent on 01/29/14 #24 X 3

## 2014-05-08 ENCOUNTER — Other Ambulatory Visit: Payer: Self-pay | Admitting: Cardiovascular Disease

## 2014-05-09 ENCOUNTER — Encounter: Payer: Self-pay | Admitting: Pulmonary Disease

## 2014-05-09 ENCOUNTER — Ambulatory Visit (INDEPENDENT_AMBULATORY_CARE_PROVIDER_SITE_OTHER): Payer: Medicare Other | Admitting: Pulmonary Disease

## 2014-05-09 ENCOUNTER — Ambulatory Visit (INDEPENDENT_AMBULATORY_CARE_PROVIDER_SITE_OTHER)
Admission: RE | Admit: 2014-05-09 | Discharge: 2014-05-09 | Disposition: A | Discharge status: Home / Self Care | Payer: Medicare Other | Source: Ambulatory Visit | Attending: Pulmonary Disease | Admitting: Pulmonary Disease

## 2014-05-09 VITALS — BP 104/70 | HR 74 | Temp 97.0°F | Ht 64.0 in | Wt 135.2 lb

## 2014-05-09 DIAGNOSIS — J479 Bronchiectasis, uncomplicated: Secondary | ICD-10-CM

## 2014-05-09 DIAGNOSIS — M949 Disorder of cartilage, unspecified: Secondary | ICD-10-CM

## 2014-05-09 DIAGNOSIS — I491 Atrial premature depolarization: Secondary | ICD-10-CM

## 2014-05-09 DIAGNOSIS — J449 Chronic obstructive pulmonary disease, unspecified: Secondary | ICD-10-CM | POA: Insufficient documentation

## 2014-05-09 DIAGNOSIS — Z23 Encounter for immunization: Secondary | ICD-10-CM

## 2014-05-09 DIAGNOSIS — M8949 Other hypertrophic osteoarthropathy, multiple sites: Secondary | ICD-10-CM

## 2014-05-09 DIAGNOSIS — M150 Primary generalized (osteo)arthritis: Secondary | ICD-10-CM

## 2014-05-09 DIAGNOSIS — C50919 Malignant neoplasm of unspecified site of unspecified female breast: Secondary | ICD-10-CM

## 2014-05-09 DIAGNOSIS — M159 Polyosteoarthritis, unspecified: Secondary | ICD-10-CM

## 2014-05-09 DIAGNOSIS — M899 Disorder of bone, unspecified: Secondary | ICD-10-CM

## 2014-05-09 DIAGNOSIS — K5730 Diverticulosis of large intestine without perforation or abscess without bleeding: Secondary | ICD-10-CM

## 2014-05-09 NOTE — Progress Notes (Signed)
Patient ID: Miranda Thompson, female   DOB: 1933-03-31, 81 y.o.   MRN: [id number]  Subjective:    Patient ID: Miranda Thompson, female    DOB: Jul 20, 1933, 81 y.o.   MRN: [id number]  HPI 81 y/o WF here for a follow up visit... she has mult med problems as noted below...   ~  SEE PREV EPIC NOTES FOR OLDER DATA >>   ~  May 02, 2012:  58moROV & MRoguehas been stable w/ her bronchiectasis & regular cough, sputum, intermittent hemoptysis, etc... She does a great job w/ chestPT, postural drainage, and takes Mucinex 1200Bid, Fluids, & Advair250... Last sputum 1013 grew Pseudomonas resistant to Cipro/ Levaquin/ Cefipeme/ Genta (we will re-culture to check current sens panel)... She reports recent Cipro x2 for UTIs, breathing is fair, walking some but decr activ due to arthritis (knees hips) attended by DrFields w/ shots given & braces to use (she notes that cherry juice really helps)...     We reviewed prob list, meds, xrays and labs> see below for updates >> she wants Levaquin 7542md x10d for prn use...  LABS 3/14:  FLP- at goals on diet alone;  Chems- wnl;  CBC- wnl;  TSH=2.37;  VitD=31;  UA- clear...  SPUTUM 4/14 >> Pseudomonas sensitive to all antibiotics   ~  November 10, 2012:  2m5moV & MarSheran about the same- feels tired all the time, chr cough w/ occas hemoptysis (streaky phlegm), no bad episodes; she has known Bronchiectasis on Advair 250Bid & Mucinex600-2Bid w/ fluids; tried on cycic antibiotics in the past but she never felt they really helped & she prefers Prn Cipro/ Levaquin for acute superimposed infectious exacerbations; Last CXR 9/13 showed scarring LUL, chr changes left mid-lung, s/p left mastectomy & axillary node dissection, etc;  Last Sputum C&S 4/14- Pseudomonas sens to Cefepime, Fortaz, Cipro, Levaquin, etc;  She does chest PT & postural drainage daily... She takes a number of supplements including Organic apple cider vinegar daily...    We reviewed prob list, meds, xrays and  labs> see below for updates >> OK 2014 Flu vaccine...  LABS 9/14:  CBC- ok w/ Hg=13.7, WBC=6.4;  Fe=91 (25%sat)... BMet wasn't done...   ~  May 10, 2013:  2mo60mo & Miranda Thompson, chest pressure, and tachycardia w/ pulse=157 recently; we discussed the need for Cardiac eval & we will refer...     Bronchiectasis & HxPneumonia> on Advair250Bid (not taking regularly) & GFN 1200Bid; mod cough, grey colored sput, intermit hemoptysis, denies f/c/s; last pos culture was 4/14= Pseudomonas, pan-sens & treated w/ Cipro...    CHOL> on diet alone; FLP 3/15 showed TChol 172, TG 50, HDL 56, LDL 106    GI- Divertics> asked to take Align, Metamucil, etc; she had diverticulitis flair treated w/ Cipro & Flagyl    GU- HxUTI w/ pelvic pressure, back pain, freq&urgency> felt to have a chronic cystitis by DrEskridge & she understands asymptomatic bactiuria...    Hx Breast Cancer> she had surg 1994 w/ lumpectomy & LN dissection followed by XRT & Chemorx; [facility]...    DJD, LBP, Osteopenia> on Tylenol, Estradiol topically per gyn, calcium, MVI, etc; she walks >1mi/39mor exercise; followed by DrFields regularly; [facility]...    HxEczema> she uses CoQ1`0 & Borage Oil; she likes herbal remedies...  We reviewed prob list, meds, xrays and labs> see below for updates >>   CXR 3/15 showed  stable film w/ scarring LUL, no change and surg clips left breast & axilla  EKG 3/15 showed NSR w/ PACs and atrial bigem, 1st degree AVB, otherw NAD...   2DEcho 3/15 showed norm LV size & function w/ EF=55-60%, norm wall motion, valves ok w/ sl thickening of leaflets- mildMR, modTR,; mild RV dil but norm RVF & PAsys=31  LABS 3/15:  FLP- ok on diet alone;  Chems- wnl;  CBC- wnl;  Fe=69 (19%sat);  VitD=49;  TSH=4.33... UA is clear, wnl...   ~  November 08, 2013:  3moROV & Miranda Thompson had an exac 6/15- seen by TP, CXR stable (NAD), treated w/ Levaquin/ Mucinex & improved,  but she stopped her Advair & now notes sl incr SOB;  PFT w/ FEV1=1.22 (61%) & she is rec to restart her inhaler;  Notes chr cough w/ green-gray phlegm from her severe bronchiectasis (last pos sputum was 4/14 +Pseudomonas- pansensitive);  EXAM shows bilat rhonchi, no consolidation, and she is stable on her regular regimen and vigorous chest physiotherapy regimen to aide sputum production...     She had f/u DrNishan 4/15> hx palpit, bigeminy on EKG, improved on Aten25; no changes made...    She continues to follow up w/ DrFields> right knee pain, wears brace & gets shots periodically, his notes are reviewed...  We reviewed prob list, meds, xrays and labs> see below for updates >> OK 2015 Flu vaccine today...   PFT 9/15 showed FVC= 2.08 (77%), FEV1= 1.22 (61%), %1sec= 59%, mid-flows= 31% predicted... c/w GOLD Stage2 COPD, pt GroupB...  Ambulatory O2 Test>  O2sat on RA at rest= 96% w/ pulse=73/min;  Ambulated 3 laps w/ lowest O2sat= 96% w/ pulse=94/min.... PLAN>> continue same Advair250Bid, Mucinex 1200Bid, Fluids, vigorous home chest PT & postural drainage, stay as active as poss...  ~  May 09, 2014:  613moOV & Miranda Thompson reports a good interval- no intercurrent infections, breathing is about the same but she notes some sl incr DOE assoc w/ decr exercise due to knee arthritis; she sees drFields and as had several shots but wonders about TKR- given the names for DrAlusio & DrOlin as contacts... We reviewed the following medical problems during today's office visit >>     Bronchiectasis & HxPneumonia> on Advair250Bid (not taking regularly) & GFN 1200Bid; PFT 9/15 w/ GOLD stage2-B COPD; mod cough, grey colored sput, intermit hemoptysis, denies f/c/s; last pos culture was 4/14= Pseudomonas, pan-sens & treated w/ Cipro at that time; she does regular ChestPT...    Palpitations & PACs> she had PACs & atrial bigeminy; improved on Atenolol25; [facility]- no AFib or MAT & no changes made...     CHOL> on diet alone; FLP 3/15 showed TChol 172, TG 50, HDL 56, LDL 106    GI- Divertics> asked to take Align, Metamucil, etc; she had diverticulitis flair 2012 x2 & treated by DrPatterson w/ Cipro & Flagyl    GU- HxUTI w/ pelvic pressure, back pain, freq&urgency> felt to have a chronic cystitis by DrEskridge & she understands asymptomatic bactiuria...    Hx Breast Cancer> she had surg 1994 w/ lumpectomy & LN dissection followed by XRT & Chemorx; [facility] [place] exam & mammogram 3/16- OK...    DJD, LBP, Osteopenia> on Tylenol, Estradiol topically per Gyn, calcium, MVI, VitD1000; she walks >63m33m for exercise; followed by DrFields regularly; c/o incr right knee pain & she requested Ortho names for  poss TKR (Alusio/ Alvan Dame); prev BMD at [facility] & followed by DrRomaine=> now seeing DrSMiller...    HxEczema> she uses CoQ10,Borage Oil & black current; she likes herbal remedies...  We reviewed prob list, meds, xrays and labs> see below for updates >>   CXR 3/16 is stable- chronic changes on left, clips from prev left breast surg, DDD in Tspine...  LABS 3/16:  FLP- wnl on diet alone;  Chems- wnl;  CBC- wnl;  TSH=4.99...           Problem List:     ALLERGIC RHINITIS (ICD-477.9) - she uses OTC antihistamines Prn.  BRONCHIECTASIS (ICD-494.0) & Hx of PNEUMONIA (ICD-486) - on ADVAIR 250 Bid, & MUCINEX 1-2Bid...  Long hx severe bronchiectasis, granulomatous lung dis, and pneumonia (initially Dx 1968 by DrFrazier, subseq bx= noncaseating gran dz & chr inflam)... her last bronchoscopy was 4/04 revealing some mucus plugging... CTChest 4/04 showed marked bronchiectasis in lingular, LLL, RML, and mucus plugging... CT repeated 6/06- similar. ~  Sputum cultures 5/08 grew Serratia (R to amox/ceph, S to all others), branching part acid fast bacilli resembling nocardia, yeast c/w candida... ~  CXR 1/09 chr lung dis, no change from prev w/  left mid lung zone as the worst area... ~  CXR 3/10 w/ chr lung changes and NAD suspected... ~  Sputum 8/10 showed NTF only & AFB smears & cult all neg... ~  CXR 3/11 showed chr lung dis, no acute changes, stable... ~  CXR 3/12 showed chronic changes, scarring, NAD.Marland Kitchen. ~  CXR 3/13 showed essentially no change> normal heart size, prom pulm arts, bilat interstitial prominence & nodularity, bronchiectasis, NAD, surg clips from prev left breast ca surg. ~  9/13: she presents w/ an acute exac w/ cough, thick sput, congestion, fever, SOB, & chest discomfort; CXR shows worse left mid zone opacity; We decided to treat w/ empiric Levaquin x10d pending cult report==> ret w/ resist Pseudomonas, but pt states better/ improved on the Levaquin. ~  Sputum C&S 4/14 showed Pseudomonas sens to Cefepime, Tressie Ellis, Cipro, Levaquin, etc ~  3/15 & 9/15: on Advair250Bid (not taking regularly) & GFN 1200Bid; mod cough, grey colored sput, intermit hemoptysis, denies f/c/s; last pos culture was 4/14= Pseudomonas, pan-sens & treated w/ Cipro. ~  CXR 3/15 showed stable film w/ scarring LUL, no change and surg clips left breast & axilla... ~  PFTs 9/15 showed FVC= 2.08 (77%), FEV1= 1.22 (61%), %1sec= 59%, mid-flows= 31% predicted... c/w GOLD Stage2 COPD, pt GroupB... ~  3/16: on Advair250Bid & GFN 1200Bid (asked to take regularly); PFT 9/15 w/ GOLD stage2-B COPD; mod cough, grey colored sput, intermit hemoptysis, denies f/c/s; last pos culture was 4/14= Pseudomonas, pan-sens & treated w/ Cipro at that time; she does regular ChestPT... ~  CXR 3/16 is stable- chronic changes on left, clips from prev left breast surg, DDD in Tspine  PALPITATIONS >> followed by Cherly Hensen on ATENOLOL25 & improved... ~  2DEcho 3/15 showed norm LV size & function w/ EF=55-60% and no regional wall motion abn, mild MR, mild TR, PAsys=23mHg...  HYPERCHOLESTEROLEMIA, BORDERLINE (ICD-272.4) - on diet alone... ~  FLP 9/10 showed TChol 174, TG 64, HDL 49, LDL  112 ~  FLP 9/11 showed TChol 175, TG 61, HDL 48, LDL 115 ~  FLP 3/13 showed TChol 176, TG 69, HDL 64, LDL 98 ~  FLP 9/13 showed TChol 169, TG 65, HDL 48, LDL 108 ~  FLP 3/14 showed TChol 157, TG 54, HDL 46, LDL 100 ~  FLP  3/15 showed TChol 172, TG 50, HDL 56, LDL 106 ~  FLP 3/16 showed TChol 163, TG 70, HDL 53, LDL 96  Hx of ADENOCARCINOMA, BREAST (ICD-174.9) - S/P surgery 5/94 [facility] w/ wide excision L breast lesion and LN dissection (stage II 1.0  cm tumor), w/ post op XRT and chemotherapy... she was followed by [facility] for oncology... then DrLivesay (released in 2003). ~  routine Mammogram 2/10 was neg- f/u planned 39yr ~  f/u mammogram 2/11 = neg, NAD, f/u planned 19yr~  f/u mammogram 2/12 = neg, NAD, f/u 1y15yr  f/u mammogram 2/13 = neg, NAD... Marland Kitchen  f/u mammogram 2/14 = neg, scat parenchymal densities... ~  [facility]... Marland Kitchen  [facility]....  DIVERTICULOSIS OF COLON (ICD-562.10) - colonoscopy 3/05 by DrPatterson showed divertics only... she has +fam hx colon ca w/ colon check [id number] f/u colonoscopy 5/11 showed +divertics, no polyps... ~  3/12:  Hx, exam & CTAbd c/w diverticulitis; treated w/ Cipro/ Flagyl & resolved;  Advised Metamucil, Miralax, etc. ~  11/12:  She had f/u DrPatterson after another bout of diverticulitis; he notes regular BMs, good appetite, stable weight; he wanted her to STOP Naprosyn, ok to use Tylenol/ Tramadol, rec hi fiber, saw the [place].  DEGENERATIVE JOINT DISEASE (ICD-715.90) - see Ortho eval 2010 by DrDaldorf... she takes Glucosamine & Herbal supplements- milk thistle/ tumeric which she says really helps... ~  4/14: she saw DrKarlFields for SportsMed> c/o bilat knee pain, known arthritis, uses body helix knee (compression) sleeves & Tramadol prn; takes tart red cherry oil daily & he rec devil's claw, tumeric, & curcumin... ~  3/15: she continues to f/u w/ drFields for knee shots periodically... ~   3/16: she is requesting name of Ortho for possCrowheart LOW BACK PAIN SYNDROME (ICD-724.2) - LBP [facility] w/ MRI showed herniated disc L3-4 w/ mod spinal stenosis and lat recess stenosis bilat...   ROTATOR CUFF SYNDROME, RIGHT (ICD-726.10) - [facility] w/ improvement.  OSTEOPENIA (ICD-733.90) - [facility] showed norm spine, norm hip, osteopenic forearm (-2.0)... followed by GYN = DrRomaine who is treating a low Vit D level... she takes calcium, Vitamins, & Vit D 1000u daily from [place]RoKickap[facility] [place] [facility]~  labs 9/10 on 50K weekly showed Vit D level = 42... rec> change to 1000 u daily. ~  labs 9/11 on 1000 u daily showed Vit D level = 34... rec> continue daily supplement. ~  Labs 3/13 showed Vit D level = 35... Continue supplements. ~  Labs 3/14 showed Vit D level = 31 ~  Labs 3/15 showed Vit D level = 49  Hx of ANEMIA (ICD-285.9) -  resolved... ~  labs 9/10 showed Hg= 13.6 ~  labs 9/11 showed Hg= 13.2 ~  Labs 3/13 showed Hg= 13.4 ~  Labs 3/14 showed Hg= 13.3 ~  Labs 3/15 showed Hg= 13.4  ECZEMA - she has hx of eczema on her hands and has used CoQ10 and Borage Oil as rec by an herbAdministratorPennDelaware Cityrecently changed to [facility] instead of these other supplements & rash is much improved...   Past Surgical History  Procedure Laterality Date  . Lung biopsy  1968    TSurg---Dr. FrazMare FerrariLeft breast lumpectomy and ln dissection  06/1992    Dr. NewmLucia GaskinsTonsillectomy      Outpatient Encounter Prescriptions as of  05/09/2014  Medication Sig  . acetaminophen (TYLENOL) 500 MG tablet Take 500 mg by mouth every 6 (six) hours as needed for pain.  Marland Kitchen aspirin 81 MG tablet Take 81 mg by mouth daily.    Marland Kitchen atenolol (TENORMIN) 25 MG tablet TAKE 1 TABLET (25 MG TOTAL) BY MOUTH DAILY.  Marland Kitchen Estradiol 10 MCG TABS vaginal tablet Place 1 tablet (10 mcg total) vaginally 2 (two) times a week.  . Fluticasone-Salmeterol (ADVAIR DISKUS) 250-50  MCG/DOSE AEPB INHALE 1 PUFF ONCE A DAY  . glucosamine-chondroitin 500-400 MG tablet Take 1 tablet by mouth daily.    Marland Kitchen guaiFENesin (MUCINEX) 600 MG 12 hr tablet Take 1,200 mg by mouth 2 (two) times daily.  . Multiple Vitamins-Minerals (MULTIVITAMIN & MINERAL PO) Take 1 tablet by mouth daily.    Marland Kitchen OVER THE COUNTER MEDICATION 600 mg. Calcium Daily  . Probiotic Product (ALIGN PO) Take by mouth.    No Known Allergies   Current Medications, Allergies, Past Medical History, Past Surgical History, Family History, and Social History were reviewed in [employer] record.     Review of Systems         See HPI - all other systems neg except as noted...  The patient complains of dyspnea on exertion and abdominal pain.  The patient denies anorexia, fever, weight loss, weight gain, vision loss, decreased hearing, hoarseness, chest pain, syncope, peripheral edema, prolonged cough, headaches, hemoptysis, melena, hematochezia, severe indigestion/heartburn, hematuria, incontinence, muscle weakness, suspicious skin lesions, transient blindness, difficulty walking, depression, unusual weight change, abnormal bleeding, enlarged lymph nodes, and angioedema.     Objective:   Physical Exam     WD, WN, 81 y/o WF in NAD... GENERAL:  Alert & oriented; pleasant & cooperative... HEENT:  [place]/AT, EOM-wnl, PERRLA, EACs-clear, TMs-wnl, NOSE-clear, THROAT-clear & wnl. NECK:  Supple w/ fairROM; no JVD; normal carotid impulses w/o bruits; no thyromegaly or nodules palpated; no lymphadenopathy. CHEST:  scat bilat rhonchi without wheezing, rales or signs of consolidation... HEART:  Regular Rhythm; without murmurs/ rubs/ or gallops heard... ABDOMEN:  Soft w/ mild tender LLQ; normal bowel sounds; no organomegaly or masses detected. EXT: without deformities or arthritic changes; no varicose veins/ venous insuffic/ or edema. NEURO:  CN's intact;  no focal neuro deficits... DERM:  No lesions noted; no rash  etc...  RADIOLOGY DATA:  Reviewed in the EPIC EMR & discussed w/ the patient...  LABORATORY DATA:  Reviewed in the EPIC EMR & discussed w/ the patient...    Assessment & Plan:    COPD/ Bronchiectasis/ Hx pneumonia>  On Advair250, Mucinex2Bid, Fluids & vigorous Chest PT/ postural drainage... 9/13> treating acute exac w/ Levaquin x10d; Cult grew a resist Pseudomonas but she reports clinically improved, therefore continue present Rx & follow...  Repeat Sput C&S 4/14 showed pansens Pseudomonas... Baseline CXRs showed CXR 3/15 & 3/16 showed scarring LUL, no change, and surg clips left breast & axilla... PFTs 9/15 showed FVC= 2.08 (77%), FEV1= 1.22 (61%), %1sec= 59%, mid-flows= 31% predicted... c/w GOLD Stage2 COPD, pt GroupB...   CHOL>  On diet alone & f/u FLP is at goal...  Breast Cancer>  S[facility] & oncology f/u by DrNeijstrom, then Lives[facility] 2003 & released; yearly mammograms & monthly self exams were wnl...  Divertics>  She had acute diverticulitis 3/12 & 10/12> resolved w/ diet adjust & Cipro/Flagyl, now back to baseline & advised re- Metamucil, Fiberall, etc; she saw DrPatterson 11/12.  DJD/ LBP>  Ortho is DrDaldorf & DrFields;  she takes Glucosamine & Herbs and doing satis she says...  Osteopenia/ Vit D defic>  On Calcium, MVI, Vit D supplement; followed by GYN= DrRomaine...  Other medical issues as noted...    Patient's Medications  New Prescriptions   No medications on file  Previous Medications   ACETAMINOPHEN (TYLENOL) 500 MG TABLET    Take 500 mg by mouth every 6 (six) hours as needed for pain.   ASPIRIN 81 MG TABLET    Take 81 mg by mouth daily.     ATENOLOL (TENORMIN) 25 MG TABLET    TAKE 1 TABLET (25 MG TOTAL) BY MOUTH DAILY.   CHOLECALCIFEROL (VITAMIN D) 1000 UNITS TABLET    Take 1,000 Units by mouth daily.   ESTRADIOL 10 MCG TABS VAGINAL TABLET    Place 1 tablet (10 mcg total) vaginally 2 (two) times a week.   FLUTICASONE-SALMETEROL (ADVAIR DISKUS)  250-50 MCG/DOSE AEPB    INHALE 1 PUFF ONCE A DAY   GLUCOSAMINE-CHONDROITIN 500-400 MG TABLET    Take 1 tablet by mouth daily.     GUAIFENESIN (MUCINEX) 600 MG 12 HR TABLET    Take 1,200 mg by mouth 2 (two) times daily.   MULTIPLE VITAMINS-MINERALS (MULTIVITAMIN & MINERAL PO)    Take 1 tablet by mouth daily.     OVER THE COUNTER MEDICATION    600 mg. Calcium Daily   PROBIOTIC PRODUCT (ALIGN PO)    Take by mouth.  Modified Medications   No medications on file  Discontinued Medications   No medications on file

## 2014-05-09 NOTE — Patient Instructions (Signed)
Today we updated your med list in our EPIC system...    Continue your current medications the same...  Remember to do your Advair twice daily...  Today we gave you the 2nd pneumonia vaccine- PREVNAR-13 (one & done)...    We also gave you the combination Tetanus shot called the TDAP (good for 10 yrs)...  Today we did your follow up CXR... Please return to our lab one morning this week for your FASTING blood work...    We will contact you w/ the results when available...   Call for any questions or if we can be of service in any way...  Let's plan a follow up visit in 43mo, sooner if needed for problems.Marland KitchenMarland Kitchen

## 2014-05-10 ENCOUNTER — Other Ambulatory Visit (INDEPENDENT_AMBULATORY_CARE_PROVIDER_SITE_OTHER): Payer: Medicare Other

## 2014-05-10 DIAGNOSIS — I491 Atrial premature depolarization: Secondary | ICD-10-CM | POA: Diagnosis not present

## 2014-05-10 DIAGNOSIS — J479 Bronchiectasis, uncomplicated: Secondary | ICD-10-CM | POA: Diagnosis not present

## 2014-05-10 DIAGNOSIS — J449 Chronic obstructive pulmonary disease, unspecified: Secondary | ICD-10-CM

## 2014-05-10 DIAGNOSIS — Z79899 Other long term (current) drug therapy: Secondary | ICD-10-CM

## 2014-05-10 LAB — CBC WITH DIFFERENTIAL/PLATELET
Basophils Absolute: 0.1 10*3/uL (ref 0.0–0.1)
Basophils Relative: 1.3 % (ref 0.0–3.0)
Eosinophils Absolute: 0.3 10*3/uL (ref 0.0–0.7)
Eosinophils Relative: 5.2 % — ABNORMAL HIGH (ref 0.0–5.0)
HCT: 38.3 % (ref 36.0–46.0)
Hemoglobin: 13.1 g/dL (ref 12.0–15.0)
Lymphocytes Relative: 23.7 % (ref 12.0–46.0)
Lymphs Abs: 1.2 10*3/uL (ref 0.7–4.0)
MCHC: 34.1 g/dL (ref 30.0–36.0)
MCV: 94.8 fl (ref 78.0–100.0)
Monocytes Absolute: 0.5 10*3/uL (ref 0.1–1.0)
Monocytes Relative: 10.8 % (ref 3.0–12.0)
Neutro Abs: 3 10*3/uL (ref 1.4–7.7)
Neutrophils Relative %: 59 % (ref 43.0–77.0)
Platelets: 234 10*3/uL (ref 150.0–400.0)
RBC: 4.04 Mil/uL (ref 3.87–5.11)
RDW: 12.9 % (ref 11.5–15.5)
WBC: 5 10*3/uL (ref 4.0–10.5)

## 2014-05-10 LAB — LIPID PANEL
Cholesterol: 163 mg/dL (ref 0–200)
HDL: 53.3 mg/dL (ref >39.00)
LDL Cholesterol: 96 mg/dL (ref 0–99)
NonHDL: 109.7
Total CHOL/HDL Ratio: 3
Triglycerides: 70 mg/dL (ref 0.0–149.0)
VLDL: 14 mg/dL (ref 0.0–40.0)

## 2014-05-10 LAB — BASIC METABOLIC PANEL
BUN: 15 mg/dL (ref 6–23)
CO2: 30 mEq/L (ref 19–32)
Calcium: 9.3 mg/dL (ref 8.4–10.5)
Chloride: 99 mEq/L (ref 96–112)
Creatinine, Ser: 0.65 mg/dL (ref 0.40–1.20)
GFR: 93.46 mL/min (ref >60.00)
Glucose, Bld: 90 mg/dL (ref 70–99)
Potassium: 4.8 mEq/L (ref 3.5–5.1)
Sodium: 134 mEq/L — ABNORMAL LOW (ref 135–145)

## 2014-05-10 LAB — HEPATIC FUNCTION PANEL
ALT: 14 U/L (ref 0–35)
AST: 23 U/L (ref 0–37)
Albumin: 3.9 g/dL (ref 3.5–5.2)
Alkaline Phosphatase: 64 U/L (ref 39–117)
Bilirubin, Direct: 0.1 mg/dL (ref 0.0–0.3)
Total Bilirubin: 0.9 mg/dL (ref 0.2–1.2)
Total Protein: 7.4 g/dL (ref 6.0–8.3)

## 2014-05-10 LAB — TSH: TSH: 4.99 u[IU]/mL — ABNORMAL HIGH (ref 0.35–4.50)

## 2014-05-17 ENCOUNTER — Ambulatory Visit (INDEPENDENT_AMBULATORY_CARE_PROVIDER_SITE_OTHER): Payer: Medicare Other | Admitting: Sports Medicine

## 2014-05-17 ENCOUNTER — Encounter: Payer: Self-pay | Admitting: Sports Medicine

## 2014-05-17 VITALS — BP 134/58 | HR 72 | Ht 64.0 in | Wt 135.0 lb

## 2014-05-17 DIAGNOSIS — M174 Other bilateral secondary osteoarthritis of knee: Secondary | ICD-10-CM

## 2014-05-17 MED ORDER — METHYLPREDNISOLONE ACETATE 40 MG/ML IJ SUSP
40.0000 mg | Freq: Once | INTRAMUSCULAR | Status: AC
  Administered 2014-05-17: 40 mg via INTRA_ARTICULAR

## 2014-05-17 NOTE — Assessment & Plan Note (Addendum)
Today most the pain is in the right knee which as documented advanced arthritis  Continue the supportive measures including insoles and the compression sleeve  We will do a cortisone injection today  Procedure:  Injection of Rt knee Ultrasound revealed degenerative changes and a small effusion in the suprapatellar pouch Lateral compartment looked fairly well preserved the medial compartment shows significant collapse and loss of the medial meniscus  Consent obtained and verified. Time-out conducted. Noted no overlying erythema, induration, or other signs of local infection. Skin prepped in a sterile fashion.  Completed without difficulty Using ultrasound guidance into the suprapatellar pouch Meds: 1 mL of Solu-Medrol and 4 mL of 1% lidocaine Pain immediately improved suggesting accurate placement of the medication. Advised to call if fevers/chills, erythema, induration, drainage, or persistent bleeding.  She should also consider using some daily tramadol with no problems  Recheck by me in 3-4 months

## 2014-05-17 NOTE — Progress Notes (Signed)
Patient ID: Miranda Thompson, female   DOB: 1933-12-08, 81 y.o.   MRN: [id number]  Patient with known grade 4 arthritis of her right knee She continues walking about 2 miles a day in spite of this Cortisone Injections have given her 6-9 months relief  She is a compression sleeve and this helps She does get some relief with ibuprofen or Aleve She gets better relief with tramadol but rarely takes it She usually takes medicine no more than 3-4 times a week  Currently her bronchiectasis is stable  Physical examination No acute distress BP 134/58 mmHg  Pulse 72  Ht 5\' 4"  (1.626 m)  Wt 135 lb (61.236 kg)  BMI 23.16 kg/m2  LMP 02/23/1985  Patient will minimal limp Chronic degenerative changes of the right knee Limitation of full flexion No warmth or significant effusion Patellar and quad tendons are nontender

## 2014-05-23 ENCOUNTER — Encounter: Payer: Self-pay | Admitting: Internal Medicine

## 2014-06-07 ENCOUNTER — Encounter: Payer: Self-pay | Admitting: Internal Medicine

## 2014-06-18 NOTE — Progress Notes (Signed)
Patient ID: Miranda Thompson, female   DOB: 1933/12/23, 81 y.o.   MRN: [id number] 81 y.o. referred by Dr [name] for palpitations. Has bad chronic lung disease with bronchiectasis Has had long standing skips but seem to be worse lately with atrial bigeminy seen on office ECG recently. At one time BP monitor showed pulse of 157 and patient felt a bit dizzy No syncope She does not wear oxygen Echo 2 years ago showed good LV and no pulmonary hypertension. She feels occasional flutterin in chest and senses that her HR is always high in 90's She does have some wheezing No chest pain chronic mild dyspnea  No sputum or fever Labs reviewed and TSH and HCT ok this month No history of MAT or PAF No bleeding diathesis   Started on Atenolol 05/17/13  Feels much better with BP and HR readings at home good  Echo ok with normal EF 3/27  Study Conclusions  - Left ventricle: The cavity size was normal. Systolic function was normal. The estimated ejection fraction was in the range of 55% to 60%. Wall motion was normal; there were no regional wall motion abnormalities. Doppler parameters are consistent with mildly elevated ventricular end-diastolic filling pressure. - Aortic valve: Trileaflet; mildly thickened leaflets. Transvalvular velocity was within the normal range. There was no stenosis. No regurgitation. - Aortic root: The aortic root was normal in size. - Mitral valve: Mildly thickened leaflets . Mild regurgitation. - Right ventricle: The cavity size was mildly dilated. Wall thickness was normal. Systolic function was normal. - Right atrium: The atrium was mildly dilated. - Atrial septum: No defect or patent foramen ovale was identified. - Tricuspid valve: Mild-moderate regurgitation. - Pulmonic valve: Mild regurgitation. - Pulmonary arteries: Systolic pressure was within the normal range. PA peak pressure: 63mm Hg (S).  No complaints has had flu shot no recent lung infections and no palpitations      ROS: Denies fever, malais, weight loss, blurry vision, decreased visual acuity, cough, sputum, SOB, hemoptysis, pleuritic pain, palpitaitons, heartburn, abdominal pain, melena, lower extremity edema, claudication, or rash.  All other systems reviewed and negative  General: Affect appropriate Frail elderly female  HEENT: normal Neck supple with no adenopathy JVP normal no bruits no thyromegaly Lungs rhonchi diffuse no  wheezing and good diaphragmatic motion Heart:  S1/S2 no murmur, no rub, gallop or click PMI normal Abdomen: benighn, BS positve, no tenderness, no AAA no bruit.  No HSM or HJR Distal pulses intact with no bruits No edema Neuro non-focal Skin warm and dry No muscular weakness   Current Outpatient Prescriptions  Medication Sig Dispense Refill  . acetaminophen (TYLENOL) 500 MG tablet Take 500 mg by mouth every 6 (six) hours as needed for pain.    Marland Kitchen aspirin 81 MG tablet Take 81 mg by mouth daily.      Marland Kitchen atenolol (TENORMIN) 25 MG tablet TAKE 1 TABLET (25 MG TOTAL) BY MOUTH DAILY. 90 tablet 0  . cholecalciferol (VITAMIN D) 1000 UNITS tablet Take 1,000 Units by mouth daily.    . Estradiol 10 MCG TABS vaginal tablet Place 1 tablet (10 mcg total) vaginally 2 (two) times a week. 24 tablet 4  . Fluticasone-Salmeterol (ADVAIR DISKUS) 250-50 MCG/DOSE AEPB INHALE 1 PUFF ONCE A DAY (Patient taking differently: Inhale 1 puff into the lungs 2 (two) times daily. INHALE 1 PUFF ONCE A DAY) 60 each 6  . glucosamine-chondroitin 500-400 MG tablet Take 1 tablet by mouth daily.      Marland Kitchen guaiFENesin ([place])  600 MG 12 hr tablet Take 1,200 mg by mouth 2 (two) times daily.    . Multiple Vitamins-Minerals (MULTIVITAMIN & MINERAL PO) Take 1 tablet by mouth daily.      Marland Kitchen OVER THE COUNTER MEDICATION 600 mg. Calcium Daily    . Probiotic Product (ALIGN PO) Take by mouth daily.      No current facility-administered medications for this visit.    Allergies  Review of patient's allergies  indicates no known allergies.  Electrocardiogram:  04/30/13  SR atrial bigeminy normal ST segments  SR rate 76  LAE  Normal   Assessment and Plan Arrhythmia:  Related to lung disease  Better on atenolol  Stable Bronchiectasis:  F/u [name] lung exam always abnormal no wheezing activity level good Vasc:  Taking 81 mg aspirin for prevention

## 2014-06-19 ENCOUNTER — Encounter: Payer: Self-pay | Admitting: Cardiovascular Disease

## 2014-06-19 ENCOUNTER — Ambulatory Visit (INDEPENDENT_AMBULATORY_CARE_PROVIDER_SITE_OTHER): Payer: Medicare Other | Admitting: Cardiovascular Disease

## 2014-06-19 VITALS — BP 104/60 | HR 76 | Ht 64.0 in | Wt 135.2 lb

## 2014-06-19 DIAGNOSIS — I491 Atrial premature depolarization: Secondary | ICD-10-CM | POA: Diagnosis not present

## 2014-06-19 NOTE — Patient Instructions (Signed)
Medication Instructions:  NO CHANGES  Labwork: NONE  Testing/Procedures: NONE  Follow-Up:   6 MONTHS WITH DR Johnsie Cancel Any Other Special Instructions Will Be Listed Below (If Applicable).

## 2014-07-27 ENCOUNTER — Ambulatory Visit (AMBULATORY_SURGERY_CENTER): Payer: Self-pay

## 2014-07-27 VITALS — Ht 64.0 in | Wt 137.8 lb

## 2014-07-27 DIAGNOSIS — Z800 Family history of malignant neoplasm of digestive organs: Secondary | ICD-10-CM

## 2014-07-27 MED ORDER — SUPREP BOWEL PREP KIT 17.5-3.13-1.6 GM/177ML PO SOLN: 1.0000 | Freq: Once | ORAL | Status: DC

## 2014-07-27 NOTE — Progress Notes (Signed)
No allergies to eggs or soy No diet/weight loss meds No home oxygen No past problems with anesthesia New diagnosis of afib (last 18 months or so)  Has email  Emmi instructions given for colonoscopy

## 2014-08-03 ENCOUNTER — Other Ambulatory Visit: Payer: Self-pay | Admitting: Cardiovascular Disease

## 2014-08-08 ENCOUNTER — Ambulatory Visit (AMBULATORY_SURGERY_CENTER): Payer: Medicare Other | Admitting: Internal Medicine

## 2014-08-08 ENCOUNTER — Encounter: Payer: Self-pay | Admitting: Internal Medicine

## 2014-08-08 VITALS — BP 124/83 | HR 67 | Temp 97.1°F | Resp 27 | Ht 64.0 in | Wt 139.0 lb

## 2014-08-08 DIAGNOSIS — D123 Benign neoplasm of transverse colon: Secondary | ICD-10-CM

## 2014-08-08 DIAGNOSIS — D122 Benign neoplasm of ascending colon: Secondary | ICD-10-CM | POA: Diagnosis not present

## 2014-08-08 DIAGNOSIS — Z1211 Encounter for screening for malignant neoplasm of colon: Secondary | ICD-10-CM

## 2014-08-08 DIAGNOSIS — Z800 Family history of malignant neoplasm of digestive organs: Secondary | ICD-10-CM

## 2014-08-08 MED ORDER — SODIUM CHLORIDE 0.9 % IV SOLN: 500.0000 mL | INTRAVENOUS | Status: DC

## 2014-08-08 NOTE — Patient Instructions (Signed)
YOU HAD AN ENDOSCOPIC PROCEDURE TODAY AT [facility]:   Refer to the procedure report that was given to you for any specific questions about what was found during the examination.  If the procedure report does not answer your questions, please call your gastroenterologist to clarify.  If you requested that your care partner not be given the details of your procedure findings, then the procedure report has been included in a sealed envelope for you to review at your convenience later.  YOU SHOULD EXPECT: Some feelings of bloating in the abdomen. Passage of more gas than usual.  Walking can help get rid of the air that was put into your GI tract during the procedure and reduce the bloating. If you had a lower endoscopy (such as a colonoscopy or flexible sigmoidoscopy) you may notice spotting of blood in your stool or on the toilet paper. If you underwent a bowel prep for your procedure, you may not have a normal bowel movement for a few days.  Please Note:  You might notice some irritation and congestion in your nose or some drainage.  This is from the oxygen used during your procedure.  There is no need for concern and it should clear up in a day or so.  SYMPTOMS TO REPORT IMMEDIATELY:   Following lower endoscopy (colonoscopy or flexible sigmoidoscopy):  Excessive amounts of blood in the stool  Significant tenderness or worsening of abdominal pains  Swelling of the abdomen that is new, acute  Fever of 100F or higher   For urgent or emergent issues, a gastroenterologist can be reached at any hour by calling [phone].   DIET: Your first meal following the procedure should be a small meal and then it is ok to progress to your normal diet. Heavy or fried foods are harder to digest and may make you feel nauseous or bloated.  Likewise, meals heavy in dairy and vegetables can increase bloating.  Drink plenty of fluids but you should avoid alcoholic beverages for 24  hours.  ACTIVITY:  You should plan to take it easy for the rest of today and you should NOT DRIVE or use heavy machinery until tomorrow (because of the sedation medicines used during the test).    FOLLOW UP: Our staff will call the number listed on your records the next business day following your procedure to check on you and address any questions or concerns that you may have regarding the information given to you following your procedure. If we do not reach you, we will leave a message.  However, if you are feeling well and you are not experiencing any problems, there is no need to return our call.  We will assume that you have returned to your regular daily activities without incident.  If any biopsies were taken you will be contacted by phone or by letter within the next 1-3 weeks.  Please call us at [phone] if you have not heard about the biopsies in 3 weeks.    SIGNATURES/CONFIDENTIALITY: You and/or your care partner have signed paperwork which will be entered into your electronic medical record.  These signatures attest to the fact that that the information above on your After Visit Summary has been reviewed and is understood.  Full responsibility of the confidentiality of this discharge information lies with you and/or your care-partner.  Polyp, diverticulosis, and high fiber information given.

## 2014-08-08 NOTE — Progress Notes (Signed)
Called to room to assist during endoscopic procedure.  Patient ID and intended procedure confirmed with present staff. Received instructions for my participation in the procedure from the performing physician.  

## 2014-08-08 NOTE — Progress Notes (Signed)
Report to PACU, RN, vss, BBS= Clear.  

## 2014-08-08 NOTE — Op Note (Addendum)
[place]  [address] [place], [address]   COLONOSCOPY PROCEDURE REPORT  PATIENT: Miranda Thompson, Miranda Thompson  MR#: [id number] BIRTHDATE: [birth year] , 81  yrs. old GENDER: female ENDOSCOPIST: Jerene Bears, MD REFERRED [id number] Patterson, M.D. PROCEDURE DATE:  08/08/2014 PROCEDURE:   Colonoscopy, screening and Colonoscopy with cold biopsy polypectomy First Screening Colonoscopy - Avg.  risk and is 50 yrs.  old or older - No.  Prior Negative Screening - Now for repeat screening. N/A  History of Adenoma - Now for follow-up colonoscopy & has been > or = to 3 yrs.  N/A  Polyps removed today? Yes ASA CLASS:   Class III INDICATIONS:Screening for colonic neoplasia and FH Colon or Rectal Adenocarcinoma (father age 34). MEDICATIONS: Monitored anesthesia care and Propofol 130 mg IV  DESCRIPTION OF PROCEDURE:   After the risks benefits and alternatives of the procedure were thoroughly explained, informed consent was obtained.  The digital rectal exam revealed no rectal mass.   The LB [id number]-[id number]  endoscope was introduced through the anus and advanced to the cecum, which was identified by both the appendix and ileocecal valve. No adverse events experienced. The quality of the prep was good.  (Suprep was used)  The instrument was then slowly withdrawn as the colon was fully examined. Estimated blood loss is zero unless otherwise noted in this procedure report.  COLON FINDINGS: Two sessile polyps ranging between 3-71mm in size were found in the ascending colon and at the hepatic flexure. Polypectomies were performed with cold forceps.  The resection was complete, the polyp tissue was completely retrieved and sent to histology.   There was moderate diverticulosis noted in the ascending colon and left colon with associated angulation. Retroflexed views revealed no abnormalities. The time to cecum = 6.4 Withdrawal time = 8.6   The scope was withdrawn and the procedure  completed. COMPLICATIONS: There were no immediate complications.  ENDOSCOPIC IMPRESSION: 1.   Two sessile polyps ranging between 3-28mm in size were found in the ascending colon and at the hepatic flexure; polypectomies were performed with cold forceps 2.   There was moderate diverticulosis noted in the ascending colon and left colon  RECOMMENDATIONS: 1.  Await pathology results 2.  Given your age, you will not need another colonoscopy for colon cancer screening or polyp surveillance.  These types of tests usually stop around the age 11. 3.  You will receive a letter within 1-2 weeks with the results of your biopsy as well as final recommendations.  Please call my office if you have not received a letter after 3 weeks.  eSigned:  Jerene Bears, MD 08/08/2014 9:57 AM Revised: 08/08/2014 9:57 AM  cc: Noralee Space, MD and The Patient

## 2014-08-09 ENCOUNTER — Telehealth: Payer: Self-pay | Admitting: *Deleted

## 2014-08-09 NOTE — Telephone Encounter (Signed)
  Follow up Call-  Call back number 08/08/2014  Post procedure Call Back phone  # [phone]  Permission to leave phone message Yes     Patient questions:  Do you have a fever, pain , or abdominal swelling? No. Pain Score  0 *  Have you tolerated food without any problems? Yes.    Have you been able to return to your normal activities? Yes.    Do you have any questions about your discharge instructions: Diet   No. Medications  No. Follow up visit  No.  Do you have questions or concerns about your Care? No.  Actions: * If pain score is 4 or above: No action needed, pain <4.

## 2014-08-11 DIAGNOSIS — D122 Benign neoplasm of ascending colon: Secondary | ICD-10-CM | POA: Diagnosis not present

## 2014-08-11 DIAGNOSIS — D123 Benign neoplasm of transverse colon: Secondary | ICD-10-CM | POA: Diagnosis not present

## 2014-08-15 ENCOUNTER — Encounter: Payer: Self-pay | Admitting: Gastroenterology

## 2014-08-16 ENCOUNTER — Encounter: Payer: Self-pay | Admitting: Internal Medicine

## 2014-08-20 ENCOUNTER — Telehealth: Payer: Self-pay | Admitting: Cardiovascular Disease

## 2014-08-20 ENCOUNTER — Other Ambulatory Visit: Payer: Self-pay | Admitting: *Deleted

## 2014-08-20 DIAGNOSIS — I10 Essential (primary) hypertension: Secondary | ICD-10-CM | POA: Insufficient documentation

## 2014-08-20 DIAGNOSIS — R002 Palpitations: Secondary | ICD-10-CM

## 2014-08-20 NOTE — Telephone Encounter (Signed)
New Message   Pt c/o BP issue: STAT if pt c/o blurred vision, one-sided weakness or slurred speech  1. What are your last 5 BP readings? 100-58  172/122   176/123   126/91    115/71   76/41  2. Are you having any other symptoms (ex. Dizziness, headache, blurred vision, passed out)? Short of breath, slight headache  3. What is your BP issue? A-fib

## 2014-08-20 NOTE — Telephone Encounter (Signed)
Calling stating her BP has been up and down; readings starting from 6/20: 74/43 HR 79; 1 hr later 70/41  HR 80; 6/24 146/94 87; 1 hr later 132/78 82; 6/25 153/90 80; 1 hr later 169/114 70; 1 hr later after meds 131/75 65; 6/26 139/97 75; 1 hr later 116/77 62; last pm 100/58 71; 6/27 172/122 73; 1hr later before meds 172/123 73; after meds 1 hr later 126/91 62; 1hr later 115/71 70. States she is having AF but do not see any documentation that she has AF. States she has never worn a monitor.  Last EKG was NSR.  States she feels "heart pounding and irreg".  Spoke w/Lori Gerhardt,NP/flex who suggest that she wear a 24 hr monitor and 24 hr BP cuff.  Then depending on results will schedule to see [name] or NP/PA. Advised pt that the schedulers will call her to have her come in for monitor and cuff.  She verbalizes understanding and will continue to monitor her BP.

## 2014-08-21 ENCOUNTER — Ambulatory Visit (INDEPENDENT_AMBULATORY_CARE_PROVIDER_SITE_OTHER): Payer: Medicare Other

## 2014-08-21 ENCOUNTER — Encounter: Payer: Self-pay | Admitting: *Deleted

## 2014-08-21 DIAGNOSIS — R002 Palpitations: Secondary | ICD-10-CM

## 2014-08-21 DIAGNOSIS — I10 Essential (primary) hypertension: Secondary | ICD-10-CM

## 2014-08-21 NOTE — Progress Notes (Signed)
Patient ID: Miranda Thompson, female   DOB: 1933/06/24, 81 y.o.   MRN: [id number] 24 hour ambulatory blood pressure monitor applied to patient.

## 2014-08-28 ENCOUNTER — Telehealth: Payer: Self-pay | Admitting: Cardiovascular Disease

## 2014-08-28 NOTE — Telephone Encounter (Signed)
New Message  Pt called req a call back to discuss the monitor results/ please call.

## 2014-08-28 NOTE — Telephone Encounter (Signed)
Requested results are still pending. Notified patient of status and that it may take a couple of days to get finalized report. Requested for Miranda Thompson to let her know just as soon as any information is available. Routed to [place], per patient request.

## 2014-08-29 ENCOUNTER — Encounter: Payer: Self-pay | Admitting: Pulmonary Disease

## 2014-08-29 NOTE — Telephone Encounter (Signed)
Follow up      Calling to get monitor results.  Please call pt and let her know something (report back or not)

## 2014-08-29 NOTE — Telephone Encounter (Signed)
Monitor shows no significant arrhythmias. PAC's, PVC's.  I spoke with Miranda Thompson and reviewed these results with her. She is also asking about bp monitor results. I told Miranda Thompson I did not see these in chart yet but I would follow up on.  She is also asking about paperwork she left in office about atenolol. She would like [name] to review and see if atenolol should be changed due to her COPD.  She reports she discussed shortness of breath with [name] at last office visit but she thinks it has worsened since office visit.  I spoke with [facility] in the monitor room and BP results will be given to [name] to review when back in office on Friday.  Note from Miranda Thompson will be included with these results.  I then spoke with Miranda Thompson and let her know information would be given to [name] to review on Friday.

## 2014-08-29 NOTE — Telephone Encounter (Signed)
Her dyspnea is from her lung disease and bronchiectasis  On atenolol for PAC;s  Will look at BP monitor friday

## 2014-08-29 NOTE — Telephone Encounter (Signed)
SN please advise. Thanks.  

## 2014-08-30 ENCOUNTER — Encounter: Payer: Self-pay | Admitting: Pulmonary Disease

## 2014-08-31 NOTE — Telephone Encounter (Signed)
B/P MONITOR  IN NORMAL RANGE PER  DR  NISHAN  ALSO PT  MAY DECREASE ATENOLOL  25  MG  TO QOD  FOR  1  WEEK  THEN DISCONTINUE  PT  NOTIFIED ./CY

## 2014-08-31 NOTE — Telephone Encounter (Signed)
Please advise SN thanks 

## 2014-08-31 NOTE — Telephone Encounter (Signed)
PT  AND PT'S  DAUGHTER  HAD LONG  DISCUSSION RE  ATENOLOL  HAS  NOTED   DECREASE  IN  EXERCISE  ENDURANCE   AND LUNGS  HAVE  NOT  CHANGED  IN VERY LONG TIME   WILL DISCUSS WITH DR Johnsie Cancel  .Adonis Housekeeper

## 2014-10-23 ENCOUNTER — Telehealth: Payer: Self-pay | Admitting: Cardiovascular Disease

## 2014-10-23 NOTE — Telephone Encounter (Signed)
Error-ah 

## 2014-10-25 ENCOUNTER — Ambulatory Visit (INDEPENDENT_AMBULATORY_CARE_PROVIDER_SITE_OTHER): Payer: Medicare Other | Admitting: Cardiovascular Disease

## 2014-10-25 ENCOUNTER — Encounter: Payer: Self-pay | Admitting: Cardiovascular Disease

## 2014-10-25 VITALS — BP 150/68 | HR 98 | Ht 64.0 in | Wt 137.8 lb

## 2014-10-25 DIAGNOSIS — I10 Essential (primary) hypertension: Secondary | ICD-10-CM | POA: Diagnosis not present

## 2014-10-25 MED ORDER — DILTIAZEM HCL ER COATED BEADS 240 MG PO CP24: 240.0000 mg | ORAL_CAPSULE | Freq: Every day | ORAL | Status: DC

## 2014-10-25 NOTE — Progress Notes (Signed)
Patient ID: Miranda Thompson, female   DOB: [birth year], 81 y.o.   MRN: [id number] 81 y.o. referred by Dr [name] for palpitations. Has bad chronic lung disease with bronchiectasis Has had long standing skips but seem to be worse lately with atrial bigeminy seen on office ECG recently. At one time BP monitor showed pulse of 157 and patient felt a bit dizzy No syncope She does not wear oxygen Echo 2 years ago showed good LV and no pulmonary hypertension. She feels occasional flutterin in chest and senses that her HR is always high in 90's She does have some wheezing No chest pain chronic mild dyspnea  No sputum or fever Labs reviewed and TSH and HCT ok this month No history of MAT or PAF No bleeding diathesis   Started on Atenolol 05/17/13   Eventually complained of low endurance and stopped at daughters request   BP monitor showed good control  Echo ok with normal EF 3/27  Study Conclusions  - Left ventricle: The cavity size was normal. Systolic function was normal. The estimated ejection fraction was in the range of 55% to 60%. Wall motion was normal; there were no regional wall motion abnormalities. Doppler parameters are consistent with mildly elevated ventricular end-diastolic filling pressure. - Aortic valve: Trileaflet; mildly thickened leaflets. Transvalvular velocity was within the normal range. There was no stenosis. No regurgitation. - Aortic root: The aortic root was normal in size. - Mitral valve: Mildly thickened leaflets . Mild regurgitation. - Right ventricle: The cavity size was mildly dilated. Wall thickness was normal. Systolic function was normal. - Right atrium: The atrium was mildly dilated. - Atrial septum: No defect or patent foramen ovale was identified. - Tricuspid valve: Mild-moderate regurgitation. - Pulmonic valve: Mild regurgitation. - Pulmonary arteries: Systolic pressure was within the normal range. PA peak pressure: 43mm Hg (S).  No complaints has had flu  shot no recent lung infections and no palpitations     ROS: Denies fever, malais, weight loss, blurry vision, decreased visual acuity, cough, sputum, SOB, hemoptysis, pleuritic pain, palpitaitons, heartburn, abdominal pain, melena, lower extremity edema, claudication, or rash.  All other systems reviewed and negative  General: Affect appropriate Frail elderly female  HEENT: normal Neck supple with no adenopathy JVP normal no bruits no thyromegaly Lungs rhonchi diffuse no  wheezing and good diaphragmatic motion Heart:  S1/S2 no murmur, no rub, gallop or click PMI normal Abdomen: benighn, BS positve, no tenderness, no AAA no bruit.  No HSM or HJR Distal pulses intact with no bruits No edema Neuro non-focal Skin warm and dry No muscular weakness   Current Outpatient Prescriptions  Medication Sig Dispense Refill  . acetaminophen (TYLENOL) 500 MG tablet Take 500 mg by mouth every 6 (six) hours as needed for pain.    Marland Kitchen aspirin 81 MG tablet Take 81 mg by mouth daily.      . cholecalciferol (VITAMIN D) 1000 UNITS tablet Take 1,000 Units by mouth daily.    . Estradiol 10 MCG TABS vaginal tablet Place 1 tablet (10 mcg total) vaginally 2 (two) times a week. 24 tablet 4  . Fluticasone-Salmeterol (ADVAIR DISKUS) 250-50 MCG/DOSE AEPB INHALE 1 PUFF ONCE A DAY (Patient taking differently: Inhale 1 puff into the lungs 2 (two) times daily. INHALE 1 PUFF ONCE A DAY) 60 each 6  . glucosamine-chondroitin 500-400 MG tablet Take 1 tablet by mouth daily.      Marland Kitchen guaiFENesin ([place]) 600 MG 12 hr tablet Take 1,200 mg by mouth 2 (two)  times daily.    . Multiple Vitamins-Minerals (MULTIVITAMIN & MINERAL PO) Take 1 tablet by mouth daily.      Marland Kitchen OVER THE COUNTER MEDICATION 600 mg. Calcium Daily    . Probiotic Product (ALIGN PO) Take by mouth daily.      No current facility-administered medications for this visit.    Allergies  Review of patient's allergies indicates no known  allergies.  Electrocardiogram:  04/30/13  SR atrial bigeminy normal ST segments  SR rate 76  LAE  Normal   Assessment and Plan Arrhythmia:  Related to lung disease Atenolol stopped  Stable  Bronchiectasis:  F/u [name] lung exam always abnormal no wheezing activity level good Vasc:  Taking 81 mg aspirin for prevention HTN:  Steroid injection in knees should be out of system.  Thinks beta blocker made her more tired  Add Cardizem 240 mg F/u next available

## 2014-10-25 NOTE — Patient Instructions (Addendum)
Medication Instructions:   START DILTIAZEM  240 MG  1  EVERY DAY   Labwork: NONE  Testing/Procedures: NONE  Follow-Up: Your physician recommends that you schedule a follow-up appointment in: NEXT AVAILABLE WITH  DR Johnsie Cancel  Any Other Special Instructions Will Be Listed Below (If Applicable).

## 2014-10-26 ENCOUNTER — Encounter: Payer: Self-pay | Admitting: Pulmonary Disease

## 2014-11-01 ENCOUNTER — Other Ambulatory Visit: Payer: Self-pay | Admitting: Cardiovascular Disease

## 2014-11-01 NOTE — Telephone Encounter (Signed)
Looks like the patient is no longer taking this, but just wanted to verify this. Please advise. Thanks, MI

## 2014-11-06 ENCOUNTER — Encounter: Payer: Self-pay | Admitting: Pulmonary Disease

## 2014-11-06 ENCOUNTER — Ambulatory Visit (INDEPENDENT_AMBULATORY_CARE_PROVIDER_SITE_OTHER): Payer: Medicare Other | Admitting: Pulmonary Disease

## 2014-11-06 VITALS — BP 140/80 | HR 98 | Temp 97.8°F | Wt 136.0 lb

## 2014-11-06 DIAGNOSIS — Z23 Encounter for immunization: Secondary | ICD-10-CM

## 2014-11-06 DIAGNOSIS — M159 Polyosteoarthritis, unspecified: Secondary | ICD-10-CM

## 2014-11-06 DIAGNOSIS — I10 Essential (primary) hypertension: Secondary | ICD-10-CM

## 2014-11-06 DIAGNOSIS — J471 Bronchiectasis with (acute) exacerbation: Secondary | ICD-10-CM | POA: Diagnosis not present

## 2014-11-06 DIAGNOSIS — M8949 Other hypertrophic osteoarthropathy, multiple sites: Secondary | ICD-10-CM

## 2014-11-06 DIAGNOSIS — I491 Atrial premature depolarization: Secondary | ICD-10-CM

## 2014-11-06 DIAGNOSIS — M150 Primary generalized (osteo)arthritis: Secondary | ICD-10-CM

## 2014-11-06 DIAGNOSIS — R002 Palpitations: Secondary | ICD-10-CM | POA: Diagnosis not present

## 2014-11-06 DIAGNOSIS — J449 Chronic obstructive pulmonary disease, unspecified: Secondary | ICD-10-CM | POA: Diagnosis not present

## 2014-11-06 DIAGNOSIS — M174 Other bilateral secondary osteoarthritis of knee: Secondary | ICD-10-CM

## 2014-11-06 MED ORDER — DILTIAZEM HCL ER COATED BEADS 120 MG PO CP24: 120.0000 mg | ORAL_CAPSULE | Freq: Every day | ORAL | Status: DC

## 2014-11-06 NOTE — Patient Instructions (Signed)
Today we updated your med list in our EPIC system...    Continue your current medications the same...  We decided to try a low dose of the CARDIZEM-CD 120mg  one cap daily...    Monitor your BP & pulse & let me know how you are doing...  We gave you the 2016 Flu shot today...  Call for any questions...  Let's plan a follow up visit in 6-8weeks, sooner if needed for problems.Marland KitchenMarland Kitchen

## 2014-11-06 NOTE — Progress Notes (Signed)
Patient ID: Miranda Thompson, female   DOB: 01/02/1934, 81 y.o.   MRN: [id number]  Subjective:    Patient ID: Miranda Thompson, female    DOB: Aug 01, 1933, 81 y.o.   MRN: [id number]  HPI 81 y/o WF here for a follow up visit... she has mult med problems as noted below...   ~  SEE PREV EPIC NOTES FOR OLDER DATA >>    LABS 3/14:  FLP- at goals on diet alone;  Chems- wnl;  CBC- wnl;  TSH=2.37;  VitD=31;  UA- clear...  SPUTUM 4/14 >> Pseudomonas sensitive to all antibiotics   LABS 9/14:  CBC- ok w/ Hg=13.7, WBC=6.4;  Fe=91 (25%sat)... BMet wasn't done...   ~  May 10, 2013:  35moROV & MKayleannis c/o HBP, chest pressure, and tachycardia w/ pulse=157 recently; we discussed the need for Cardiac eval & we will refer...     Bronchiectasis & HxPneumonia> on Advair250Bid (not taking regularly) & GFN 1200Bid; mod cough, grey colored sput, intermit hemoptysis, denies f/c/s; last pos culture was 4/14= Pseudomonas, pan-sens & treated w/ Cipro...    CHOL> on diet alone; FLP 3/15 showed TChol 172, TG 50, HDL 56, LDL 106    GI- Divertics> asked to take Align, Metamucil, etc; she had diverticulitis flair treated w/ Cipro & Flagyl    GU- HxUTI w/ pelvic pressure, back pain, freq&urgency> felt to have a chronic cystitis by DrEskridge & she understands asymptomatic bactiuria...    Hx Breast Cancer> she had surg 1994 w/ lumpectomy & LN dissection followed by XRT & Chemorx; [facility]    DJD, LBP, Osteopenia> on Tylenol, Estradiol topically per gyn, calcium, MVI, etc; she walks >174md for exercise; followed [facility] DrFields regularly; [facility]...    HxEczema> she uses CoQ1`0 & Borage Oil; she likes herbal remedies...  We reviewed prob list, meds, xrays and labs> see below for updates >>   CXR 3/15 showed stable film w/ scarring LUL, no change and surg clips left breast & axilla  EKG 3/15 showed NSR w/ PACs and atrial bigem, 1st degree AVB, otherw NAD...    2DEcho 3/15 showed norm LV size & function w/ EF=55-60%, norm wall motion, valves ok w/ sl thickening of leaflets- mildMR, modTR,; mild RV dil but norm RVF & PAsys=31  LABS 3/15:  FLP- ok on diet alone;  Chems- wnl;  CBC- wnl;  Fe=69 (19%sat);  VitD=49;  TSH=4.33... UA is clear, wnl...   ~  November 08, 2013:  46m48moV & [name] had an exac 6/15- seen by TP, CXR stable (NAD), treated w/ Levaquin/ Mucinex & improved, but she stopped her Advair & now notes sl incr SOB;  PFT w/ FEV1=1.22 (61%) & she is rec to restart her inhaler;  Notes chr cough w/ green-gray phlegm from her severe bronchiectasis (last pos sputum was 4/14 +Pseudomonas- pansensitive);  EXAM shows bilat rhonchi, no consolidation, and she is stable on her regular regimen and vigorous chest physiotherapy regimen to aide sputum production...     She had f/u DrNishan 4/15> hx palpit, bigeminy on EKG, improved on Aten25; no changes made...    She continues to follow up w/ DrFields> right knee pain, wears brace & gets shots periodically, his notes are reviewed...  We reviewed prob list, meds, xrays and labs> see below for updates >> OK 2015 Flu vaccine today...   PFT 9/15 showed FVC= 2.08 (77%), FEV1= 1.22 (61%), %1sec= 59%, mid-flows= 31%  predicted... c/w GOLD Stage2 COPD, pt GroupB...  Ambulatory O2 Test>  O2sat on RA at rest= 96% w/ pulse=73/min;  Ambulated 3 laps w/ lowest O2sat= 96% w/ pulse=94/min.... PLAN>> continue same Advair250Bid, Mucinex 1200Bid, Fluids, vigorous home chest PT & postural drainage, stay as active as poss...  ~  May 09, 2014:  38moROV & [name] reports a good interval- no intercurrent infections, breathing is about the same but she notes some sl incr DOE assoc w/ decr exercise due to knee arthritis; she sees drFields and as had several shots but wonders about TKR- given the names for DrAlusio & DrOlin as contacts... We reviewed the following medical problems during today's office visit >>     Bronchiectasis &  HxPneumonia> on Advair250Bid (not taking regularly) & GFN 1200Bid; PFT 9/15 w/ GOLD stage2-B COPD; mod cough, grey colored sput, intermit hemoptysis, denies f/c/s; last pos culture was 4/14= Pseudomonas, pan-sens & treated w/ Cipro at that time; she does regular ChestPT...    Palpitations & PACs> she had PACs & atrial bigeminy; improved on Atenolol25; [facility] no AFib or MAT & no changes made...    CHOL> on diet alone; FLP 3/15 showed TChol 172, TG 50, HDL 56, LDL 106    GI- Divertics> asked to take Align, Metamucil, etc; she had diverticulitis flair 2012 x2 & treated by DrPatterson w/ Cipro & Flagyl    GU- HxUTI w/ pelvic pressure, back pain, freq&urgency> felt to have a chronic cystitis by DrEskridge & she understands asymptomatic bactiuria...    Hx Breast Cancer> she had surg 1994 w/ lumpectomy & LN dissection followed by XRT & Chemorx; [facility] w/ neg exam & mammogram 3/16- [place]...    DJD, LBP, Osteopenia> on Tylenol, Estradiol topically per Gyn, calcium, MVI, VitD1000; she walks >157md for exercise; followed by DrFields regularly; c/o incr right knee pain & she requested Ortho names for poss TKR (Alusio/ OlAlvan Dame [facility]=> now seeing DrSMiller...    HxEczema> she uses CoQ10,Borage Oil & black current; she likes herbal remedies...  We reviewed prob list, meds, xrays and labs> see below for updates >>   CXR 3/16 is stable- chronic changes on left, clips from prev left breast surg, DDD in Tspine...  LABS 3/16:  FLP- wnl on diet alone;  Chems- wnl;  CBC- wnl;  TSH=4.99...  ~  November 06, 2014:  68m15moV & medical f/u visit>     From the pulmonary standpoint she is stable w/ her severe bronchiectasis but only using her Advair250 once daily 7 only taking the Mucinex 600m65m; asked to incr the Advair250Bid 7 the Mucinex600-2Bid w/ fluids and maintain a vigorous chest  PT & postural drainage routine to clear her airways...    She had f/u CARDS- DrNishan 10/25/14> hx palpit w/ atrial bigeminy on office EKG; prev tried Aten but stopped due to low endurance, 2DEcho 04/2013 showed norm EF=55-60%, no wall motion abn, mildMR, PAsys=31; he added CARDIZEM-CD 240mg11mut pt states that she didn't take it stating that she "only wants low-dose meds" => therefore rec to try CARDIZEN-CD 120mg/78m    She also notes that her BP is "all over the place" but BP monitor was apparently ok; she read her EKG on my chart & had a lot of questions; currently not taking any BP med & BP today= 140/80; I told her that the Cardizem would  help regulate her blood pressure as well as help control her palpitations...     She had a routine f/u colonoscopy 08/08/14 by DrPyrtle> +FamHx colon cancer in father; 2 sessile polyps removed, 3-27m size, mod diverticulosis, Path= tubular adenomas...    She saw DrFields for her knees w/ grade4 arthritis on right, cortisone shots lasted several months, uses compression sleeve & Aleve/ Tramadol; he referred her to [place]who notes "bone on bone" & rec bilat TKRs...  EXAM shows Afeb, VSS, O2sat=96% on RA;  HEENT- neg, mallampati1;  Chest- diffuse bilat rhonchi/ congestion, w/o wheezing or signs of consolidation;  Heart- RR gr1/6 SEM w/o r/g;  Abd- soft, neg;  Ext- w/o c/c/e...  EKG 06/19/14 showed NSR, rate78, poor R progression V1-3, otherw neg/NAD...  We reviewed prev CXR, LABS, etc... IMP/PLAN>>  She reminded me that her daughter didn't want her on Atenolol, & she only wants "low-dose" meds therefore try CARDIZEM-CD 1262md;  Needs to incr the Advair250 to Bid & the Mucinex 60033mo 2Bid w/ fluids + continue the postural drainage/ chest PT regimen... OK 2016 flu vaccine today.           Problem List:     ALLERGIC RHINITIS (ICD-477.9) - she uses OTC antihistamines Prn.  BRONCHIECTASIS (ICD-494.0) & Hx of PNEUMONIA (ICD-486) - on ADVAIR 250 Bid, & MUCINEX  1-2Bid...  Long hx severe bronchiectasis, granulomatous lung dis, and pneumonia (initially Dx 1968 by DrFrazier, subseq bx= noncaseating gran dz & chr inflam)... her last bronchoscopy was 4/04 revealing some mucus plugging... CTChest 4/04 showed marked bronchiectasis in lingular, LLL, RML, and mucus plugging... CT repeated 6/06- similar. ~  Sputum cultures 5/08 grew Serratia (R to amox/ceph, S to all others), branching part acid fast bacilli resembling nocardia, yeast c/w candida... ~  CXR 1/09 chr lung dis, no change from prev w/ left mid lung zone as the worst area... ~  CXR 3/10 w/ chr lung changes and NAD suspected... ~  Sputum 8/10 showed NTF only & AFB smears & cult all neg... ~  CXR 3/11 showed chr lung dis, no acute changes, stable... ~  CXR 3/12 showed chronic changes, scarring, NAD... Marland Kitchen  CXR 3/13 showed essentially no change> normal heart size, prom pulm arts, bilat interstitial prominence & nodularity, bronchiectasis, NAD, surg clips from prev left breast ca surg. ~  9/13: she presents w/ an acute exac w/ cough, thick sput, congestion, fever, SOB, & chest discomfort; CXR shows worse left mid zone opacity; We decided to treat w/ empiric Levaquin x10d pending cult report==> ret w/ resist Pseudomonas, but pt states better/ improved on the Levaquin. ~  Sputum C&S 4/14 showed Pseudomonas sens to Cefepime, ForTressie Ellisipro, Levaquin, etc ~  3/15 & 9/15: on Advair250Bid (not taking regularly) & GFN 1200Bid; mod cough, grey colored sput, intermit hemoptysis, denies f/c/s; last pos culture was 4/14= Pseudomonas, pan-sens & treated w/ Cipro. ~  CXR 3/15 showed stable film w/ scarring LUL, no change and surg clips left breast & axilla... ~  PFTs 9/15 showed FVC= 2.08 (77%), FEV1= 1.22 (61%), %1sec= 59%, mid-flows= 31% predicted... c/w GOLD Stage2 COPD, pt GroupB... ~  3/16: on Advair250Bid & GFN 1200Bid (asked to take regularly); PFT 9/15 w/ GOLD stage2-B COPD; mod cough, grey colored sput, intermit  hemoptysis, denies f/c/s; last pos culture was 4/14= Pseudomonas, pan-sens & treated w/ Cipro at that time; she does regular ChestPT... ~  CXR 3/16 is stable- chronic changes on left, clips from prev left breast surg, DDD in  Tspine ~  9/16: she is reminded to use the Advair250Bid & the Mucinex600-2Bid w/ fluids and maintain a vigorous chest PT & postural drainage regimen...  PALPITATIONS >> followed by Cherly Hensen on ATENOLOL25 & improved... ~  2DEcho 3/15 showed norm LV size & function w/ EF=55-60% and no regional wall motion abn, mild MR, mild TR, PAsys=103mHg... ~  9/16: she reminds me that daughter does not want her on Atenolol; DrNishan ordered CardizemCD240 but she "only wants low dose meds"; therefore try CardizemCD120/d...  HYPERCHOLESTEROLEMIA, BORDERLINE (ICD-272.4) - on diet alone... ~  FLP 9/10 showed TChol 174, TG 64, HDL 49, LDL 112 ~  FLP 9/11 showed TChol 175, TG 61, HDL 48, LDL 115 ~  FLP 3/13 showed TChol 176, TG 69, HDL 64, LDL 98 ~  FLP 9/13 showed TChol 169, TG 65, HDL 48, LDL 108 ~  FLP 3/14 showed TChol 157, TG 54, HDL 46, LDL 100 ~  FLP 3/15 showed TChol 172, TG 50, HDL 56, LDL 106 ~  FLP 3/16 showed TChol 163, TG 70, HDL 53, LDL 96  Hx of ADENOCARCINOMA, BREAST (ICD-174.9) - [facility] and LN dissection (stage II 1.0  cm tumor), w/ post op XRT and chemotherapy... [facility] (released in [facility]). ~  routine Mammogram 2/10 was neg- f/u planned 162yr~  f/u mammogram 2/11 = neg, NAD, f/u planned 1y55yr  f/u mammogram 2/12 = neg, NAD, f/u 10yr82yr f/u mammogram 2/13 = neg, NAD... ~Marland Kitchen f/u mammogram 2/14 = neg, scat parenchymal densities... ~  [facility] ~[facility]....  DIVERTICULOSIS OF COLON (ICD-562.10) - colonoscopy 3/05 by DrPatterson showed divertics only... she has +fam hx colon ca w/ colon check q5yrs46yrf/u  colonoscopy 5/11 showed +divertics, no polyps... ~  3/12:  Hx, exam & CTAbd c/w diverticulitis; treated w/ Cipro/ Flagyl & resolved;  Advised Metamucil, Miralax, etc. ~  11/12:  She had f/u DrPatterson after another bout of diverticulitis; he notes regular BMs, good appetite, stable weight; he wanted her to STOP Naprosyn, ok to use Tylenol/ Tramadol, rec hi fiber, [place].  DEGENERATIVE JOINT DISEASE (ICD-715.90) - see Ortho eval 2010 by DrDaldorf... she takes Glucosamine & Herbal supplements- milk thistle/ tumeric which she says really helps... ~  4/14: she saw DrKarlFields for SportsMed> c/o bilat knee pain, known arthritis, uses body helix knee (compression) sleeves & Tramadol prn; takes tart red cherry oil daily & he rec devil's claw, tumeric, & curcumin... ~  3/15: she continues to f/u w/ drFields for knee shots periodically... ~  3/16: she is requesting name of Ortho for poss [place]  She is managed by DrFields and DrOlin...  LOW BACK PAIN SYNDROME (ICD-724.2) - [facility] showed herniated disc L3-4 w/ mod spinal stenosis and lat recess stenosis bilat...   ROTATOR CUFF SYNDROME, RIGHT (ICD-726.10) - [facility]/ [facility].  OSTEOPENIA (ICD-733.90) - [facility], norm hip, osteopenic forearm (-2.0)... followed by GYN = DrRomaine who is treating a low Vit D level... she takes calcium, Vitamins, & Vit D 1000u daily from DrRomRolla  labs 9/10 on 50K weekly showed Vit D level = 42... rec> change to 1000 u daily. ~  labs 9/11 on 1000 u daily showed Vit  D level = 34... rec> continue daily supplement. ~  Labs 3/13 showed Vit D level = 35... Continue supplements. ~  Labs 3/14 showed Vit D level = 31 ~  Labs 3/15 showed Vit D level = 49  Hx of ANEMIA (ICD-285.9) -  resolved... ~  labs 9/10 showed Hg= 13.6 ~  labs 9/11 showed Hg= 13.2 ~  Labs 3/13 showed Hg= 13.4 ~  Labs  3/14 showed Hg= 13.3 ~  Labs 3/15 showed Hg= 13.4 ~  Labs 3/16 showed Hg= 13.1  ECZEMA - she has hx of eczema on her hands and has used CoQ10 and Borage Oil as rec by an Administrator in [place]... recently changed to [facility] Current instead of these other supplements & rash is much improved...   Past Surgical History  Procedure Laterality Date  . Lung biopsy  1968    TSurg---Dr. [name]  . Left breast lumpectomy and ln dissection  06/1992    Dr. [name]  . Tonsillectomy    . Colonoscopy      Outpatient Encounter Prescriptions as of 11/06/2014  Medication Sig  . acetaminophen (TYLENOL) 500 MG tablet Take 500 mg by mouth every 6 (six) hours as needed for pain.  Marland Kitchen aspirin 81 MG tablet Take 81 mg by mouth daily.    . cholecalciferol (VITAMIN D) 1000 UNITS tablet Take 1,000 Units by mouth daily.  . Estradiol 10 MCG TABS vaginal tablet Place 1 tablet (10 mcg total) vaginally 2 (two) times a week.  . Fluticasone-Salmeterol (ADVAIR DISKUS) 250-50 MCG/DOSE AEPB INHALE 1 PUFF ONCE A DAY (Patient taking differently: Inhale 1 puff into the lungs 2 (two) times daily. INHALE 1 PUFF ONCE A DAY)  . glucosamine-chondroitin 500-400 MG tablet Take 1 tablet by mouth daily.    Marland Kitchen guaiFENesin (MUCINEX) 600 MG 12 hr tablet Take 1,200 mg by mouth 2 (two) times daily.  . Multiple Vitamins-Minerals (MULTIVITAMIN & MINERAL PO) Take 1 tablet by mouth daily.    Marland Kitchen OVER THE COUNTER MEDICATION 600 mg. Calcium Daily  . Probiotic Product (ALIGN PO) Take by mouth daily.   Marland Kitchen diltiazem (CARDIZEM CD) 120 MG 24 hr capsule Take 1 capsule (120 mg total) by mouth daily.  . [DISCONTINUED] diltiazem (CARDIZEM CD) 240 MG 24 hr capsule Take 1 capsule (240 mg total) by mouth daily. (Patient not taking: Reported on 11/06/2014)   No facility-administered encounter medications on file as of 11/06/2014.    No Known Allergies   Current Medications, Allergies, Past Medical History, Past Surgical History, Family History, and Social History were  reviewed in [employer] record.     Review of Systems         See HPI - all other systems neg except as noted...  The patient complains of dyspnea on exertion and abdominal pain.  The patient denies anorexia, fever, weight loss, weight gain, vision loss, decreased hearing, hoarseness, chest pain, syncope, peripheral edema, prolonged cough, headaches, hemoptysis, melena, hematochezia, severe indigestion/heartburn, hematuria, incontinence, muscle weakness, suspicious skin lesions, transient blindness, difficulty walking, depression, unusual weight change, abnormal bleeding, enlarged lymph nodes, and angioedema.     Objective:   Physical Exam     WD, WN, 81 y/o WF in NAD... GENERAL:  Alert & oriented; pleasant & cooperative... HEENT:  [place]/AT, EOM-wnl, PERRLA, EACs-clear, TMs-wnl, NOSE-clear, THROAT-clear & wnl. NECK:  Supple w/ fairROM; no JVD; normal carotid impulses w/o bruits; no thyromegaly or nodules palpated; no lymphadenopathy. CHEST:  scat bilat rhonchi without wheezing, rales or signs of  consolidation... HEART:  Regular Rhythm; without murmurs/ rubs/ or gallops heard... ABDOMEN:  Soft w/ mild tender LLQ; normal bowel sounds; no organomegaly or masses detected. EXT: without deformities or arthritic changes; no varicose veins/ venous insuffic/ or edema. NEURO:  CN's intact;  no focal neuro deficits... DERM:  No lesions noted; no rash etc...  RADIOLOGY DATA:  Reviewed in the EPIC EMR & discussed w/ the patient...  LABORATORY DATA:  Reviewed in the EPIC EMR & discussed w/ the patient...    Assessment & Plan:    COPD/ Bronchiectasis/ Hx pneumonia>  On Advair250, Mucinex2Bid, Fluids & vigorous Chest PT/ postural drainage... 9/13> treating acute exac w/ Levaquin x10d; Cult grew a resist Pseudomonas but she reports clinically improved, therefore continue present Rx & follow...  Repeat Sput C&S 4/14 showed pansens Pseudomonas... Baseline CXRs showed CXR 3/15 &  3/16 showed scarring LUL, no change, and surg clips left breast & axilla... PFTs 9/15 showed FVC= 2.08 (77%), FEV1= 1.22 (61%), %1sec= 59%, mid-flows= 31% predicted... c/w GOLD Stage2 COPD, pt GroupB... REMINDED TO TAKE MEDS REGULARLY & MAINTAIN A VIGOROUS REGIMEN OF CHEST PT & POSTURAL DRAINAGE...   CHOL>  On diet alone & f/u FLP is at goal...  Breast Cancer>  [facility] & oncology f/u by DrNeijstrom, then [facility] 2003 & released; yearly mammograms & monthly self exams were wnl...  Divertics>  She had acute diverticulitis 3/12 & 10/12> resolved w/ diet adjust & Cipro/Flagyl, now back to baseline & advised re- Metamucil, Fiberall, etc; she saw DrPatterson 11/12.  DJD/ LBP>  Ortho is DrDaldorf & DrFields; she takes Glucosamine & Herbs and doing satis she says...  Osteopenia/ Vit D defic>  On Calcium, MVI, Vit D supplement; followed by GYN= DrRomaine...  Other medical issues as noted...    Patient's Medications  New Prescriptions   DILTIAZEM (CARDIZEM CD) 120 MG 24 HR CAPSULE    Take 1 capsule (120 mg total) by mouth daily.  Previous Medications   ACETAMINOPHEN (TYLENOL) 500 MG TABLET    Take 500 mg by mouth every 6 (six) hours as needed for pain.   ASPIRIN 81 MG TABLET    Take 81 mg by mouth daily.     CHOLECALCIFEROL (VITAMIN D) 1000 UNITS TABLET    Take 1,000 Units by mouth daily.   ESTRADIOL 10 MCG TABS VAGINAL TABLET    Place 1 tablet (10 mcg total) vaginally 2 (two) times a week.   FLUTICASONE-SALMETEROL (ADVAIR DISKUS) 250-50 MCG/DOSE AEPB    INHALE 1 PUFF ONCE A DAY   GLUCOSAMINE-CHONDROITIN 500-400 MG TABLET    Take 1 tablet by mouth daily.     GUAIFENESIN (MUCINEX) 600 MG 12 HR TABLET    Take 1,200 mg by mouth 2 (two) times daily.   MULTIPLE VITAMINS-MINERALS (MULTIVITAMIN & MINERAL PO)    Take 1 tablet by mouth daily.     OVER THE COUNTER MEDICATION    600 mg. Calcium Daily   PROBIOTIC PRODUCT (ALIGN PO)    Take by mouth daily.   Modified Medications   No  medications on file  Discontinued Medications   DILTIAZEM (CARDIZEM CD) 240 MG 24 HR CAPSULE    Take 1 capsule (240 mg total) by mouth daily.

## 2014-11-21 ENCOUNTER — Encounter: Payer: Self-pay | Admitting: Pulmonary Disease

## 2014-11-21 NOTE — Telephone Encounter (Signed)
Please have SN address this message. Thanks.

## 2014-11-27 NOTE — Telephone Encounter (Signed)
Please have SN address. Thanks!

## 2014-12-04 NOTE — Telephone Encounter (Signed)
Per SN, Pt is cleared for surgery. Please call pt and inform that clearance letter will be sent by the end of the week  Pt called and notified of the above Pt voiced understanding  Nothing further is needed at this time

## 2014-12-13 ENCOUNTER — Encounter: Payer: Self-pay | Admitting: Pulmonary Disease

## 2014-12-18 ENCOUNTER — Ambulatory Visit (INDEPENDENT_AMBULATORY_CARE_PROVIDER_SITE_OTHER): Payer: Medicare Other | Admitting: Pulmonary Disease

## 2014-12-18 ENCOUNTER — Encounter: Payer: Self-pay | Admitting: Pulmonary Disease

## 2014-12-18 VITALS — BP 132/74 | HR 88 | Temp 97.9°F | Wt 132.8 lb

## 2014-12-18 DIAGNOSIS — I491 Atrial premature depolarization: Secondary | ICD-10-CM

## 2014-12-18 DIAGNOSIS — I498 Other specified cardiac arrhythmias: Secondary | ICD-10-CM | POA: Diagnosis not present

## 2014-12-18 DIAGNOSIS — M170 Bilateral primary osteoarthritis of knee: Secondary | ICD-10-CM

## 2014-12-18 DIAGNOSIS — J479 Bronchiectasis, uncomplicated: Secondary | ICD-10-CM

## 2014-12-18 DIAGNOSIS — I10 Essential (primary) hypertension: Secondary | ICD-10-CM

## 2014-12-18 MED ORDER — LOSARTAN POTASSIUM 50 MG PO TABS: 50.0000 mg | ORAL_TABLET | Freq: Every day | ORAL | Status: DC

## 2014-12-18 NOTE — Telephone Encounter (Signed)
Per Dr [name], Handwritten note was mailed to Dr Aurea Graff office on 12-04-14 for surgical clearance for knee surgery. I attempted to call Dr Aurea Graff office today to check on status and could not reach anyone due to it being lunch time. Will call office back after 1pm to check on status

## 2014-12-18 NOTE — Patient Instructions (Signed)
Today we updated your med list in our EPIC system...    Continue your current medications the same...  We decided to add a low dose of LOSARTAN (Cozaar) for your BP>    Start the new Losartan 50mg  one tab daily...  Continue to monitor your BP at home & call for any questions...  I will fax today's note to DrOlin w/ our [place] for surgery medical clearance...   Call for any questions...  Let's plan a follow up visit in 34mo, sooner if needed for problems.Marland KitchenMarland Kitchen

## 2014-12-18 NOTE — Progress Notes (Signed)
Patient ID: Miranda Thompson, female   DOB: 03/28/1933, 81 y.o.   MRN: [id number]  Subjective:    Patient ID: Miranda Thompson, female    DOB: 06-02-1933, 81 y.o.   MRN: [id number]  HPI 81 y/o WF here for a follow up visit... she has mult med problems as noted below...   ~  SEE PREV EPIC NOTES FOR OLDER DATA >>    LABS 3/14:  FLP- at goals on diet alone;  Chems- wnl;  CBC- wnl;  TSH=2.37;  VitD=31;  UA- clear...  SPUTUM 4/14 >> Pseudomonas sensitive to all antibiotics   LABS 9/14:  CBC- ok w/ Hg=13.7, WBC=6.4;  Fe=91 (25%sat)... BMet wasn't done...   ~  May 10, 2013:  50moROV & MSeannais c/o HBP, chest pressure, and tachycardia w/ pulse=157 recently; we discussed the need for Cardiac eval & we will refer...     Bronchiectasis & HxPneumonia> on Advair250Bid (not taking regularly) & GFN 1200Bid; mod cough, grey colored sput, intermit hemoptysis, denies f/c/s; last pos culture was 4/14= Pseudomonas, pan-sens & treated w/ Cipro...    CHOL> on diet alone; FLP 3/15 showed TChol 172, TG 50, HDL 56, LDL 106    GI- Divertics> asked to take Align, Metamucil, etc; she had diverticulitis flair treated w/ Cipro & Flagyl    GU- HxUTI w/ pelvic pressure, back pain, freq&urgency> felt to have a chronic cystitis by DrEskridge & she understands asymptomatic bactiuria...    Hx Breast Cancer> she had surg 1994 w/ lumpectomy & LN dissection followed by XRT & Chemorx; [facility]...    DJD, LBP, Osteopenia> on Tylenol, Estradiol topically per gyn, calcium, MVI, etc; she walks >155md for exercise; followed [facility] DrFields regularly; [facility]...    HxEczema> she uses CoQ1`0 & Borage Oil; she likes herbal remedies...  We reviewed prob list, meds, xrays and labs> see below for updates >>   CXR 3/15 showed stable film w/ scarring LUL, no change and surg clips left breast & axilla  EKG 3/15 showed NSR w/ PACs and atrial bigem, 1st degree AVB, otherw NAD...    2DEcho 3/15 showed norm LV size & function w/ EF=55-60%, norm wall motion, valves ok w/ sl thickening of leaflets- mildMR, modTR,; mild RV dil but norm RVF & PAsys=31  LABS 3/15:  FLP- ok on diet alone;  Chems- wnl;  CBC- wnl;  Fe=69 (19%sat);  VitD=49;  TSH=4.33... UA is clear, wnl...   ~  November 08, 2013:  44m57moV & Miranda Thompson had an exac 6/15- seen by TP, CXR stable (NAD), treated w/ Levaquin/ Mucinex & improved, but she stopped her Advair & now notes sl incr SOB;  PFT w/ FEV1=1.22 (61%) & she is rec to restart her inhaler;  Notes chr cough w/ green-gray phlegm from her severe bronchiectasis (last pos sputum was 4/14 +Pseudomonas- pansensitive);  EXAM shows bilat rhonchi, no consolidation, and she is stable on her regular regimen and vigorous chest physiotherapy regimen to aide sputum production...     She had f/u DrNishan 4/15> hx palpit, bigeminy on EKG, improved on Aten25; no changes made...    She continues to follow up w/ DrFields> right knee pain, wears brace & gets shots periodically, his notes are reviewed...  We reviewed prob list, meds, xrays and labs> see below for updates >> OK 2015 Flu vaccine today...   PFT 9/15 showed FVC= 2.08 (77%), FEV1= 1.22 (61%), %1sec= 59%, mid-flows= 31%  predicted... c/w GOLD Stage2 COPD, pt GroupB...  Ambulatory O2 Test>  O2sat on RA at rest= 96% w/ pulse=73/min;  Ambulated 3 laps w/ lowest O2sat= 96% w/ pulse=94/min.... PLAN>> continue same Advair250Bid, Mucinex 1200Bid, Fluids, vigorous home chest PT & postural drainage, stay as active as poss...  ~  May 09, 2014:  54moROV & Miranda Thompson reports a good interval- no intercurrent infections, breathing is about the same but she notes some sl incr DOE assoc w/ decr exercise due to knee arthritis; she sees drFields and as had several shots but wonders about TKR- given the names for DrAlusio & DrOlin as contacts... We reviewed the following medical problems during today's office visit >>     Bronchiectasis &  HxPneumonia> on Advair250Bid (not taking regularly) & GFN 1200Bid; PFT 9/15 w/ GOLD stage2-B COPD; mod cough, grey colored sput, intermit hemoptysis, denies f/c/s; last pos culture was 4/14= Pseudomonas, pan-sens & treated w/ Cipro at that time; she does regular ChestPT...    Palpitations & PACs> she had PACs & atrial bigeminy; improved on Atenolol25; [facility] no AFib or MAT & no changes made...    CHOL> on diet alone; FLP 3/15 showed TChol 172, TG 50, HDL 56, LDL 106    GI- Divertics> asked to take Align, Metamucil, etc; she had diverticulitis flair 2012 x2 & treated by DrPatterson w/ Cipro & Flagyl    GU- HxUTI w/ pelvic pressure, back pain, freq&urgency> felt to have a chronic cystitis by DrEskridge & she understands asymptomatic bactiuria...    Hx Breast Cancer> she had surg 1994 w/ lumpectomy & LN dissection followed by XRT & Chemorx; [facility] followed by GQuitaque seen 12/15 w/ neg exam & mammogram 3/16- OK...    DJD, LBP, Osteopenia> on Tylenol, Estradiol topically per Gyn, calcium, MVI, VitD1000; she walks >152md for exercise; followed by DrFields regularly; c/o incr right knee pain & she requested Ortho names for poss TKR (Alusio/ [facility]van [facility] now seeing DrSMiller...    HxEczema> she uses CoQ10,Borage Oil & black current; she likes herbal remedies...  We reviewed prob list, meds, xrays and labs> see below for updates >>   CXR 3/16 is stable- chronic changes on left, clips from prev left breast surg, DDD in Tspine...  LABS 3/16:  FLP- wnl on diet alone;  Chems- wnl;  CBC- wnl;  TSH=4.99...  ~  November 06, 2014:  27m427moV & medical f/u visit>     From the pulmonary standpoint she is stable w/ her severe bronchiectasis but only using her Advair250 once daily & only taking the Mucinex 600m79m; asked to incr the Advair250Bid & the Mucinex600-2Bid w/ fluids and maintain a vigorous chest  PT & postural drainage routine to clear her airways...    She had f/u CARDS- DrNishan 10/25/14> hx palpit w/ atrial bigeminy on office EKG; prev tried Aten but stopped due to low endurance, 2DEcho 04/2013 showed norm EF=55-60%, no wall motion abn, mildMR, PAsys=31; he added CARDIZEM-CD 240mg40mut pt states that she didn't take it stating that she "only wants low-dose meds" => therefore rec to try CARDIZEN-CD 120mg/30m    She also notes that her BP is "all over the place" but BP monitor was apparently ok; she read her EKG on my chart & had a lot of questions; currently not taking any BP med & BP today= 140/80; I told her that the Cardizem would  help regulate her blood pressure as well as help control her palpitations...     She had a routine f/u colonoscopy 08/08/14 by DrPyrtle> +FamHx colon cancer in father; 2 sessile polyps removed, 3-53m size, mod diverticulosis, Path= tubular adenomas...    She saw DrFields for her knees w/ grade4 arthritis on right, cortisone shots lasted several months, uses compression sleeve & Aleve/ Tramadol; he referred her to DDearywho notes "bone on bone" & rec bilat TKRs...  EXAM shows Afeb, VSS, O2sat=96% on RA;  HEENT- neg, mallampati1;  Chest- diffuse bilat rhonchi/ congestion, w/o wheezing or signs of consolidation;  Heart- RR gr1/6 SEM w/o r/g;  Abd- soft, neg;  Ext- w/o c/c/e...  EKG 06/19/14 showed NSR, rate78, poor R progression V1-3, otherw neg/NAD...  We reviewed prev CXR, LABS, etc... IMP/PLAN>>  She reminded me that her daughter didn't want her on Atenolol, & she only wants "low-dose" meds therefore try CARDIZEM-CD 1233md;  Needs to incr the Advair250 to Bid & the Mucinex 60047mo 2Bid w/ fluids + continue the postural drainage/ chest PT regimen... OK 2016 flu vaccine today.  ~  December 18, 2014:  6wk ROV & recheck> MarTimothys been taking the CardizemCD120 since last OV & watching her home BP checks very carefully; pressure readings are somewhat labile- 110/70  range to 160/100+ range using her home cuff; she remains asymptomatic w/o HA, CP, palpit, dizzy, edema, etc; exercise is limited by her knees and she tells me she is ready for TKR surg by DrOlin; we decided to add LOSARTAN50m72mfor her BP control & she will continue to monitor it at home... EXAM shows Afeb, VSS, O2sat=96% on RA;  BP=132/74; HEENT- neg, mallampati1;  Chest- diffuse bilat rhonchi w/o wheezing or signs of consolidation;  Heart- RR gr1/6 SEM w/o r/g;  Abd- soft, neg;  Ext- w/o c/c/e... PLAN>>  She will continue all current meds and ADD LOSARTAN 50mg58mwatch BPs at home 7 call for questions; she asks that I send this note to DrOlin- OK for surg from the medical standpoint...           Problem List:     ALLERGIC RHINITIS (ICD-477.9) - she uses OTC antihistamines Prn.  BRONCHIECTASIS (ICD-494.0) & Hx of PNEUMONIA (ICD-486) - on ADVAIR 250 Bid, & MUCINEX 1-2Bid...  Long hx severe bronchiectasis, granulomatous lung dis, and pneumonia (initially Dx 1968 by DrFrazier, subseq bx= noncaseating gran dz & chr inflam)... her last bronchoscopy was 4/04 revealing some mucus plugging... CTChest 4/04 showed marked bronchiectasis in lingular, LLL, RML, and mucus plugging... CT repeated 6/06- similar. ~  Sputum cultures 5/08 grew Serratia (R to amox/ceph, S to all others), branching part acid fast bacilli resembling nocardia, yeast c/w candida... ~  CXR 1/09 chr lung dis, no change from prev w/ left mid lung zone as the worst area... ~  CXR 3/10 w/ chr lung changes and NAD suspected... ~  Sputum 8/10 showed NTF only & AFB smears & cult all neg... ~  CXR 3/11 showed chr lung dis, no acute changes, stable... ~  CXR 3/12 showed chronic changes, scarring, NAD... ~ Marland KitchenCXR 3/13 showed essentially no change> normal heart size, prom pulm arts, bilat interstitial prominence & nodularity, bronchiectasis, NAD, surg clips from prev left breast ca surg. ~  9/13: she presents w/ an acute exac w/ cough, thick sput,  congestion, fever, SOB, & chest discomfort; CXR shows worse left mid zone opacity; We decided to treat w/ empiric Levaquin x10d pending cult report==> ret  w/ resist Pseudomonas, but pt states better/ improved on the Levaquin. ~  Sputum C&S 4/14 showed Pseudomonas sens to Cefepime, Tressie Ellis, Cipro, Levaquin, etc ~  3/15 & 9/15: on Advair250Bid (not taking regularly) & GFN 1200Bid; mod cough, grey colored sput, intermit hemoptysis, denies f/c/s; last pos culture was 4/14= Pseudomonas, pan-sens & treated w/ Cipro. ~  CXR 3/15 showed stable film w/ scarring LUL, no change and surg clips left breast & axilla... ~  PFTs 9/15 showed FVC= 2.08 (77%), FEV1= 1.22 (61%), %1sec= 59%, mid-flows= 31% predicted... c/w GOLD Stage2 COPD, pt GroupB... ~  3/16: on Advair250Bid & GFN 1200Bid (asked to take regularly); PFT 9/15 w/ GOLD stage2-B COPD; mod cough, grey colored sput, intermit hemoptysis, denies f/c/s; last pos culture was 4/14= Pseudomonas, pan-sens & treated w/ Cipro at that time; she does regular ChestPT... ~  CXR 3/16 is stable- chronic changes on left, clips from prev left breast surg, DDD in Tspine ~  9/16: she is reminded to use the Advair250Bid & the Mucinex600-2Bid w/ fluids and maintain a vigorous chest PT & postural drainage regimen...  PALPITATIONS >> followed by Barton Fanny on Atenolol25 & improved... ~  2DEcho 3/15 showed norm LV size & function w/ EF=55-60% and no regional wall motion abn, mild MR, mild TR, PAsys=42mHg... ~  9/16: she reminds me that daughter does not want her on Atenolol; DrNishan ordered CardizemCD240 but she "only wants low dose meds"; therefore try CardizemCD120/d... ~  10/16: pt returns w/ extensive BP records on her CardizemCD120/d; BP today= 132/74 but BP at home up to 160/100+ and we decided to add LOSATAN 538md to her regimen...   HYPERCHOLESTEROLEMIA, BORDERLINE (ICD-272.4) - on diet alone... ~  FLP 9/10 showed TChol 174, TG 64, HDL 49, LDL 112 ~  FLP 9/11 showed  TChol 175, TG 61, HDL 48, LDL 115 ~  FLP 3/13 showed TChol 176, TG 69, HDL 64, LDL 98 ~  FLP 9/13 showed TChol 169, TG 65, HDL 48, LDL 108 ~  FLP 3/14 showed TChol 157, TG 54, HDL 46, LDL 100 ~  FLP 3/15 showed TChol 172, TG 50, HDL 56, LDL 106 ~  FLP 3/16 showed TChol 163, TG 70, HDL 53, LDL 96  Hx of ADENOCARCINOMA, BREAST (ICD-174.9) - [facility] lesion and LN dissection (stage II 1.0  cm tumor), w/ post op XRT and chemotherapy... she was [facility]... then DrLivesay (released in 2003). ~  routine Mammogram 2/10 was neg- f/u planned 1y827yr  f/u mammogram 2/11 = neg, NAD, f/u planned 27yr42yr f/u mammogram 2/12 = neg, NAD, f/u 27yr.70yrf/u mammogram 2/13 = neg, NAD... ~ Marland Kitchenf/u mammogram 2/14 = neg, scat parenchymal densities... ~  [facility]... ~ Marland Kitchenf[facility]....  DIVERTICULOSIS OF COLON (ICD-562.10) - colonoscopy 3/05 by DrPatterson showed divertics only... she has +fam hx colon ca w/ colon check q5yrs.73yr/u colonoscopy 5/11 showed +divertics, no polyps... ~  3/12:  Hx, exam & CTAbd c/w diverticulitis; treated w/ Cipro/ Flagyl & resolved;  Advised Metamucil, Miralax, etc. ~  11/12:  She had f/u DrPatterson after another bout of diverticulitis; he notes regular BMs, good appetite, stable weight; he wanted her to STOP Naprosyn, ok to use Tylenol/ Tramadol, rec hi fiber, saw the [place]. ~  She had a routine f/u colonoscopy 08/08/14 by DrPyrtle> +FamHx colon cancer in father; 2  sessile polyps removed, 3-58m size, mod diverticulosis, Path= tubular adenomas...  DEGENERATIVE JOINT DISEASE (ICD-715.90) - see Ortho eval 2010 by DrDaldorf... she takes Glucosamine & Herbal supplements- milk thistle/ tumeric which she says really helps... ~  4/14: she saw DrKarlFields for SportsMed> c/o bilat knee pain, known arthritis, uses body helix knee (compression) sleeves & Tramadol prn; takes  tart red cherry oil daily & he rec devil's claw, tumeric, & curcumin... ~  3/15: she continues to f/u w/ drFields for knee shots periodically... ~  3/16: she is [place] of Ortho [place].. ~  She is managed by DrFields and DrOlin => told she needs TKRs & we will send medical clearance...  LOW BACK PAIN SYNDROME (ICD-724.2) - [facility] showed herniated disc L3-4 w/ mod spinal stenosis and lat recess stenosis bilat...   ROTATOR CUFF SYNDROME, RIGHT (ICD-726.10) - [facility].  OSTEOPENIA (ICD-733.90) - [facility] norm spine, norm hip, osteopenic forearm (-2.0)... followed by GYN = DrRomaine who is treating a low Vit D level... she takes calcium, Vitamins, & Vit D 1000u daily from [place].. ~  labs 9/10 on 50K weekly showed Vit D level = 42... rec> change to 1000 u daily. ~  labs 9/11 on 1000 u daily showed Vit D level = 34... rec> continue daily supplement. ~  Labs 3/13 showed Vit D level = 35... Continue supplements. ~  Labs 3/14 showed Vit D level = 31 ~  Labs 3/15 showed Vit D level = 49... Continue supplements  Hx of ANEMIA (ICD-285.9) -  resolved... ~  labs 9/10 showed Hg= 13.6 ~  labs 9/11 showed Hg= 13.2 ~  Labs 3/13 showed Hg= 13.4 ~  Labs 3/14 showed Hg= 13.3 ~  Labs 3/15 showed Hg= 13.4 ~  Labs 3/16 showed Hg= 13.1  ECZEMA - she has hx of eczema on her hands and has used CoQ10 and Borage Oil as rec by an [facility]in [facility].. [facility] to [facility] instead of these other supplements & rash is much improved...   Past Surgical History  Procedure Laterality Date  . Lung biopsy  1968    TSurg---Dr. FMare Ferrari . Left breast lumpectomy and ln dissection  06/1992    Dr. NLucia Gaskins . Tonsillectomy    . Colonoscopy      Outpatient Encounter Prescriptions as of 12/18/2014  Medication Sig  . acetaminophen (TYLENOL) 500 MG tablet Take 500 mg by mouth  every 6 (six) hours as needed for pain.  .Marland Kitchenaspirin 81 MG tablet Take 81 mg by mouth daily.    . cholecalciferol (VITAMIN D) 1000 UNITS tablet Take 1,000 Units by mouth daily.  .Marland Kitchendiltiazem (CARDIZEM CD) 120 MG 24 hr capsule Take 1 capsule (120 mg total) by mouth daily.  . Estradiol 10 MCG TABS vaginal tablet Place 1 tablet (10 mcg total) vaginally 2 (two) times a week.  . Fluticasone-Salmeterol (ADVAIR DISKUS) 250-50 MCG/DOSE AEPB INHALE 1 PUFF ONCE A DAY (Patient taking differently: Inhale 1 puff into the lungs 2 (two) times daily. INHALE 1 PUFF ONCE A DAY)  . glucosamine-chondroitin 500-400 MG tablet Take 1 tablet by mouth daily.    .Marland KitchenguaiFENesin (MUCINEX) 600 MG 12 hr tablet Take 1,200 mg by mouth 2 (two) times daily.  . Multiple Vitamins-Minerals (MULTIVITAMIN & MINERAL PO) Take 1 tablet by mouth daily.    .Marland KitchenOVER THE COUNTER MEDICATION 600  mg. Calcium Daily  . Probiotic Product (ALIGN PO) Take by mouth daily.   Marland Kitchen losartan (COZAAR) 50 MG tablet Take 1 tablet (50 mg total) by mouth daily.   No facility-administered encounter medications on file as of 12/18/2014.    No Known Allergies   Current Medications, Allergies, Past Medical History, Past Surgical History, Family History, and Social History were reviewed in [employer] record.     Review of Systems         See HPI - all other systems neg except as noted...  The patient complains of dyspnea on exertion and abdominal pain.  The patient denies anorexia, fever, weight loss, weight gain, vision loss, decreased hearing, hoarseness, chest pain, syncope, peripheral edema, prolonged cough, headaches, hemoptysis, melena, hematochezia, severe indigestion/heartburn, hematuria, incontinence, muscle weakness, suspicious skin lesions, transient blindness, difficulty walking, depression, unusual weight change, abnormal bleeding, enlarged lymph nodes, and angioedema.     Objective:   Physical Exam     WD, WN, 81 y/o WF in  NAD... GENERAL:  Alert & oriented; pleasant & cooperative... HEENT:  [place]/AT, EOM-wnl, PERRLA, EACs-clear, TMs-wnl, NOSE-clear, THROAT-clear & wnl. NECK:  Supple w/ fairROM; no JVD; normal carotid impulses w/o bruits; no thyromegaly or nodules palpated; no lymphadenopathy. CHEST:  scat bilat rhonchi without wheezing, rales or signs of consolidation... HEART:  Regular Rhythm; without murmurs/ rubs/ or gallops heard... ABDOMEN:  Soft w/ mild tender LLQ; normal bowel sounds; no organomegaly or masses detected. EXT: without deformities or arthritic changes; no varicose veins/ venous insuffic/ or edema. NEURO:  CN's intact;  no focal neuro deficits... DERM:  No lesions noted; no rash etc...  RADIOLOGY DATA:  Reviewed in the EPIC EMR & discussed w/ the patient...  LABORATORY DATA:  Reviewed in the EPIC EMR & discussed w/ the patient...    Assessment & Plan:    COPD/ Bronchiectasis/ Hx pneumonia>  On Advair250, Mucinex2Bid, Fluids & vigorous Chest PT/ postural drainage... 9/13> treating acute exac w/ Levaquin x10d; Cult grew a resist Pseudomonas but she reports clinically improved, therefore continue present Rx & follow...  Repeat Sput C&S 4/14 showed pansens Pseudomonas... Baseline CXRs showed CXR 3/15 & 3/16 showed scarring LUL, no change, and surg clips left breast & axilla... PFTs 9/15 showed FVC= 2.08 (77%), FEV1= 1.22 (61%), %1sec= 59%, mid-flows= 31% predicted... c/w GOLD Stage2 COPD, pt GroupB... REMINDED TO TAKE MEDS REGULARLY & MAINTAIN A VIGOROUS REGIMEN OF CHEST PT & POSTURAL DRAINAGE...   HBP & palpit (PACs)>  followed by Cherly Hensen; on CardizemCD120 & she does not want BBlockers, & only wants "low-dose" meds; BP at home variable w/ 160/100+ episodes so we decided to add low dose LOSARTAN 3m/d...  CHOL>  On diet alone & f/u FLP is at goal...  Breast Cancer>  [facility]/u by DrNeijstrom, then [place] 2003 & released; yearly mammograms & monthly self  exams were wnl...  Divertics>  She had acute diverticulitis 3/12 & 10/12> resolved w/ diet adjust & Cipro/Flagyl, now back to baseline & advised re- Metamucil, Fiberall, etc; she saw DrPatterson 11/12.  DJD/ LBP>  Ortho is DrDaldorf/ DrOlin & DrFields; she takes Glucosamine & Herbs; needs TKRs and we will send medical clearance to DrOlin's office...  Osteopenia/ Vit D defic>  On Calcium, MVI, Vit D supplement; followed by GYN= DrRomaine...  Other medical issues as noted...    Patient's Medications  New Prescriptions   LOSARTAN (COZAAR) 50 MG TABLET    Take 1 tablet (  50 mg total) by mouth daily.  Previous Medications   ACETAMINOPHEN (TYLENOL) 500 MG TABLET    Take 500 mg by mouth every 6 (six) hours as needed for pain.   ASPIRIN 81 MG TABLET    Take 81 mg by mouth daily.     CHOLECALCIFEROL (VITAMIN D) 1000 UNITS TABLET    Take 1,000 Units by mouth daily.   DILTIAZEM (CARDIZEM CD) 120 MG 24 HR CAPSULE    Take 1 capsule (120 mg total) by mouth daily.   ESTRADIOL 10 MCG TABS VAGINAL TABLET    Place 1 tablet (10 mcg total) vaginally 2 (two) times a week.   FLUTICASONE-SALMETEROL (ADVAIR DISKUS) 250-50 MCG/DOSE AEPB    INHALE 1 PUFF ONCE A DAY   GLUCOSAMINE-CHONDROITIN 500-400 MG TABLET    Take 1 tablet by mouth daily.     GUAIFENESIN (MUCINEX) 600 MG 12 HR TABLET    Take 1,200 mg by mouth 2 (two) times daily.   MULTIPLE VITAMINS-MINERALS (MULTIVITAMIN & MINERAL PO)    Take 1 tablet by mouth daily.     OVER THE COUNTER MEDICATION    600 mg. Calcium Daily   PROBIOTIC PRODUCT (ALIGN PO)    Take by mouth daily.   Modified Medications   No medications on file  Discontinued Medications   No medications on file

## 2014-12-19 NOTE — Telephone Encounter (Signed)
Called Dr. Aurea Graff office at [phone]. Was advised will have to take a message to send to Dr. Alvan Dame as they see nothing in pt chart. Will await call back

## 2014-12-21 NOTE — Telephone Encounter (Signed)
Called Dr. Aurea Graff office and they did receive it. Nothing further needed

## 2014-12-26 ENCOUNTER — Encounter: Payer: Self-pay | Admitting: Cardiovascular Disease

## 2014-12-26 NOTE — Telephone Encounter (Signed)
LM FOR  PT TO CALL BACK  SO  MAY RESCHEDULE November APP TO LATER  DATE PER  E MAIL ./CY

## 2015-01-02 ENCOUNTER — Ambulatory Visit: Payer: Medicare Other | Admitting: Cardiovascular Disease

## 2015-01-08 ENCOUNTER — Other Ambulatory Visit: Payer: Self-pay | Admitting: Pulmonary Disease

## 2015-01-14 ENCOUNTER — Encounter (HOSPITAL_COMMUNITY): Payer: Self-pay

## 2015-01-14 ENCOUNTER — Encounter (HOSPITAL_COMMUNITY)
Admission: RE | Admit: 2015-01-14 | Discharge: 2015-01-14 | Disposition: A | Discharge status: Home / Self Care | Payer: Medicare Other | Source: Ambulatory Visit | Attending: Orthopedic Surgery | Admitting: Orthopedic Surgery

## 2015-01-14 DIAGNOSIS — M179 Osteoarthritis of knee, unspecified: Secondary | ICD-10-CM | POA: Insufficient documentation

## 2015-01-14 DIAGNOSIS — Z01818 Encounter for other preprocedural examination: Secondary | ICD-10-CM | POA: Insufficient documentation

## 2015-01-14 HISTORY — DX: Essential (primary) hypertension: I10

## 2015-01-14 HISTORY — DX: Chronic obstructive pulmonary disease, unspecified: J44.9

## 2015-01-14 LAB — CBC
HCT: 36.1 % (ref 36.0–46.0)
Hemoglobin: 12.5 g/dL (ref 12.0–15.0)
MCH: 33.1 pg (ref 26.0–34.0)
MCHC: 34.6 g/dL (ref 30.0–36.0)
MCV: 95.5 fL (ref 78.0–100.0)
Platelets: 242 10*3/uL (ref 150–400)
RBC: 3.78 MIL/uL — ABNORMAL LOW (ref 3.87–5.11)
RDW: 12.3 % (ref 11.5–15.5)
WBC: 7.4 10*3/uL (ref 4.0–10.5)

## 2015-01-14 LAB — BASIC METABOLIC PANEL
Anion gap: 5 (ref 5–15)
BUN: 11 mg/dL (ref 6–20)
CO2: 29 mmol/L (ref 22–32)
Calcium: 9.1 mg/dL (ref 8.9–10.3)
Chloride: 99 mmol/L — ABNORMAL LOW (ref 101–111)
Creatinine, Ser: 0.5 mg/dL (ref 0.44–1.00)
GFR calc Af Amer: >60 mL/min (ref >60)
GFR calc non Af Amer: >60 mL/min (ref >60)
Glucose, Bld: 94 mg/dL (ref 65–99)
Potassium: 4.6 mmol/L (ref 3.5–5.1)
Sodium: 133 mmol/L — ABNORMAL LOW (ref 135–145)

## 2015-01-14 LAB — SURGICAL PCR SCREEN
MRSA, PCR: NEGATIVE
Staphylococcus aureus: NEGATIVE

## 2015-01-14 LAB — URINALYSIS, ROUTINE W REFLEX MICROSCOPIC
Bilirubin Urine: NEGATIVE
Glucose, UA: NEGATIVE mg/dL
Hgb urine dipstick: NEGATIVE
Ketones, ur: NEGATIVE mg/dL
Leukocytes, UA: NEGATIVE
Nitrite: NEGATIVE
Protein, ur: NEGATIVE mg/dL
Specific Gravity, Urine: 1.008 (ref 1.005–1.030)
pH: 5 (ref 5.0–8.0)

## 2015-01-14 LAB — PROTIME-INR
INR: 1.06 (ref 0.00–1.49)
Prothrombin Time: 14 seconds (ref 11.6–15.2)

## 2015-01-14 LAB — APTT: aPTT: 30 seconds (ref 24–37)

## 2015-01-14 LAB — ABO/RH: ABO/RH(D): B POS

## 2015-01-14 NOTE — Progress Notes (Signed)
Echo 3-15 epic Chest xray 06-19-14 epic ekg 06-19-14 epic

## 2015-01-14 NOTE — Patient Instructions (Addendum)
Miranda Thompson  01/14/2015   Your procedure is scheduled on: 01-21-15  Report to [facility] Main  Entrance take [facility]  elevators to 3rd floor to  [place] at  1000 AM.  Call this number if you have problems the morning of surgery [phone]   Remember: ONLY 1 PERSON MAY GO WITH YOU TO SHORT STAY TO GET  READY MORNING OF [place].  Do not eat food or drink liquids :After Midnight.     Take these medicines the morning of surgery with A SIP OF WATER: Diltiazem, Mucinex, Advair                               You may not have any metal on your body including hair pins and              piercings  Do not wear jewelry, make-up, lotions, powders or perfumes, deodorant             Do not wear nail polish.  Do not shave  48 hours prior to surgery.              Men may shave face and neck.   Do not bring valuables to the hospital. [place].  Contacts, dentures or bridgework may not be worn into surgery.  Leave suitcase in the car. After surgery it may be brought to your room.     Patients discharged the day of surgery will not be allowed to drive home.  Name and phone number of your driver:  Special Instructions: N/A              Please read over the following fact sheets you were given: _____________________________________________________________________             [facility] - Preparing for Surgery Before surgery, you can play an important role.  Because skin is not sterile, your skin needs to be as free of germs as possible.  You can reduce the number of germs on your skin by washing with CHG (chlorahexidine gluconate) soap before surgery.  CHG is an antiseptic cleaner which kills germs and bonds with the skin to continue killing germs even after washing. Please DO NOT use if you have an allergy to CHG or antibacterial soaps.  If your skin becomes reddened/irritated stop using the CHG and inform  your nurse when you arrive at Short Stay. Do not shave (including legs and underarms) for at least 48 hours prior to the first CHG shower.  You may shave your face/neck. Please follow these instructions carefully:  1.  Shower with CHG Soap the night before surgery and the  morning of Surgery.  2.  If you choose to wash your hair, wash your hair first as usual with your  normal  shampoo.  3.  After you shampoo, rinse your hair and body thoroughly to remove the  shampoo.                           4.  Use CHG as you would any other liquid soap.  You can apply chg directly  to the skin and wash  Gently with a scrungie or clean washcloth.  5.  Apply the CHG Soap to your body ONLY FROM THE NECK DOWN.   Do not use on face/ open                           Wound or open sores. Avoid contact with eyes, ears mouth and genitals (private parts).                       Wash face,  Genitals (private parts) with your normal soap.             6.  Wash thoroughly, paying special attention to the area where your surgery  will be performed.  7.  Thoroughly rinse your body with warm water from the neck down.  8.  DO NOT shower/wash with your normal soap after using and rinsing off  the CHG Soap.                9.  Pat yourself dry with a clean towel.            10.  Wear clean pajamas.            11.  Place clean sheets on your bed the night of your first shower and do not  sleep with pets. Day of Surgery : Do not apply any lotions/deodorants the morning of surgery.  Please wear clean clothes to the hospital/surgery center.  FAILURE TO FOLLOW THESE INSTRUCTIONS MAY RESULT IN THE CANCELLATION OF YOUR SURGERY PATIENT SIGNATURE_________________________________  NURSE SIGNATURE__________________________________  ________________________________________________________________________   Adam Phenix  An incentive spirometer is a tool that can help keep your lungs clear and active. This  tool measures how well you are filling your lungs with each breath. Taking long deep breaths may help reverse or decrease the chance of developing breathing (pulmonary) problems (especially infection) following:  A long period of time when you are unable to move or be active. BEFORE THE PROCEDURE   If the spirometer includes an indicator to show your best effort, your nurse or respiratory therapist will set it to a desired goal.  If possible, sit up straight or lean slightly forward. Try not to slouch.  Hold the incentive spirometer in an upright position. INSTRUCTIONS FOR USE   Sit on the edge of your bed if possible, or sit up as far as you can in bed or on a chair.  Hold the incentive spirometer in an upright position.  Breathe out normally.  Place the mouthpiece in your mouth and seal your lips tightly around it.  Breathe in slowly and as deeply as possible, raising the piston or the ball toward the top of the column.  Hold your breath for 3-5 seconds or for as long as possible. Allow the piston or ball to fall to the bottom of the column.  Remove the mouthpiece from your mouth and breathe out normally.  Rest for a few seconds and repeat Steps 1 through 7 at least 10 times every 1-2 hours when you are awake. Take your time and take a few normal breaths between deep breaths.  The spirometer may include an indicator to show your best effort. Use the indicator as a goal to work toward during each repetition.  After each set of 10 deep breaths, practice coughing to be sure your lungs are clear. If you have an incision (the cut made at the time of surgery),  support your incision when coughing by placing a pillow or rolled up towels firmly against it. Once you are able to get out of bed, walk around indoors and cough well. You may stop using the incentive spirometer when instructed by your caregiver.  RISKS AND COMPLICATIONS  Take your time so you do not get dizzy or  light-headed.  If you are in pain, you may need to take or ask for pain medication before doing incentive spirometry. It is harder to take a deep breath if you are having pain. AFTER USE  Rest and breathe slowly and easily.  It can be helpful to keep track of a log of your progress. Your caregiver can provide you with a simple table to help with this. If you are using the spirometer at home, follow these instructions: [place] IF:   You are having difficultly using the spirometer.  You have trouble using the spirometer as often as instructed.  Your pain medication is not giving enough relief while using the spirometer.  You develop fever of 100.5 F (38.1 C) or higher. SEEK IMMEDIATE MEDICAL CARE IF:   You cough up bloody sputum that had not been present before.  You develop fever of 102 F (38.9 C) or greater.  You develop worsening pain at or near the incision site. MAKE SURE YOU:   Understand these instructions.  Will watch your condition.  Will get help right away if you are not doing well or get worse. Document Released: 06/22/2006 Document Revised: 05/04/2011 Document Reviewed: 08/23/2006 Exit[employer] Patient Information 2014 [employer], [place].   ________________________________________________________________________  WHAT IS A BLOOD TRANSFUSION? Blood Transfusion Information  A transfusion is the replacement of blood or some of its parts. Blood is made up of multiple cells which provide different functions.  Red blood cells carry oxygen and are used for blood loss replacement.  White blood cells fight against infection.  Platelets control bleeding.  Plasma helps clot blood.  Other blood products are available for specialized needs, such as hemophilia or other clotting disorders. BEFORE THE TRANSFUSION  Who gives blood for transfusions?   Healthy volunteers who are fully evaluated to make sure their blood is safe. This is blood bank  blood. Transfusion therapy is the safest it has ever been in the practice of medicine. Before blood is taken from a donor, a complete history is taken to make sure that person has no history of diseases nor engages in risky social behavior (examples are intravenous drug use or sexual activity with multiple partners). The donor's travel history is screened to minimize risk of transmitting infections, such as malaria. The donated blood is tested for signs of infectious diseases, such as HIV and hepatitis. The blood is then tested to be sure it is compatible with you in order to minimize the chance of a transfusion reaction. If you or a relative donates blood, this is often done in anticipation of surgery and is not appropriate for emergency situations. It takes many days to process the donated blood. RISKS AND COMPLICATIONS Although transfusion therapy is very safe and saves many lives, the main dangers of transfusion include:   Getting an infectious disease.  Developing a transfusion reaction. This is an allergic reaction to something in the blood you were given. Every precaution is taken to prevent this. The decision to have a blood transfusion has been considered carefully by your caregiver before blood is given. Blood is not given unless the benefits outweigh the risks. AFTER THE TRANSFUSION  Right after receiving a blood transfusion, you will usually feel much better and more energetic. This is especially true if your red blood cells have gotten low (anemic). The transfusion raises the level of the red blood cells which carry oxygen, and this usually causes an energy increase.  The nurse administering the transfusion will monitor you carefully for complications. HOME CARE INSTRUCTIONS  No special instructions are needed after a transfusion. You may find your energy is better. Speak with your caregiver about any limitations on activity for underlying diseases you may have. SEEK MEDICAL CARE IF:    Your condition is not improving after your transfusion.  You develop redness or irritation at the intravenous (IV) site. SEEK IMMEDIATE MEDICAL CARE IF:  Any of the following symptoms occur over the next 12 hours:  Shaking chills.  You have a temperature by mouth above 102 F (38.9 C), not controlled by medicine.  Chest, back, or muscle pain.  People around you feel you are not acting correctly or are confused.  Shortness of breath or difficulty breathing.  Dizziness and fainting.  You get a rash or develop hives.  You have a decrease in urine output.  Your urine turns a dark color or changes to pink, red, or brown. Any of the following symptoms occur over the next 10 days:  You have a temperature by mouth above 102 F (38.9 C), not controlled by medicine.  Shortness of breath.  Weakness after normal activity.  The white part of the eye turns yellow (jaundice).  You have a decrease in the amount of urine or are urinating less often.  Your urine turns a dark color or changes to pink, red, or brown. Document Released: 02/07/2000 Document Revised: 05/04/2011 Document Reviewed: 09/26/2007 [facility] Patient Information 2014 [place], [place].  _______________________________________________________________________

## 2015-01-18 ENCOUNTER — Encounter: Payer: Self-pay | Admitting: Pulmonary Disease

## 2015-01-20 NOTE — H&P (Signed)
TOTAL KNEE ADMISSION H&P  Patient is being admitted for right total knee arthroplasty.  Subjective:  Chief Complaint:    Right knee primary OA / pain  HPI: Miranda Thompson, 81 y.o. female, has a history of pain and functional disability in the right knee due to arthritis and has failed non-surgical conservative treatments for greater than 12 weeks to include NSAID's and/or analgesics, corticosteriod injections and activity modification.  Onset of symptoms was gradual, starting 30+ years ago with gradually worsening course since that time. The patient noted no past surgery on the right knee(s).  Patient currently rates pain in the right knee(s) at 8 out of 10 with activity. Patient has night pain, worsening of pain with activity and weight bearing, pain that interferes with activities of daily living, pain with passive range of motion, crepitus and joint swelling.  Patient has evidence of periarticular osteophytes and joint space narrowing by imaging studies.  There is no active infection.   Risks, benefits and expectations were discussed with the patient.  Risks including but not limited to the risk of anesthesia, blood clots, nerve damage, blood vessel damage, failure of the prosthesis, infection and up to and including death.  Patient understand the risks, benefits and expectations and wishes to proceed with surgery.   PCP: Noralee Space, MD  D/C Plans:      Home with HHPT  Post-op Meds:       No Rx given   Tranexamic Acid:      To be given - IV   Decadron:      Is to be given  FYI:     ASA    Norco    Patient Active Problem List   Diagnosis Date Noted  . Palpitations 08/20/2014  . Episode of hypertension 08/20/2014  . COPD, moderate ([place]) 05/09/2014  . Premature atrial contractions 11/08/2013  . Cardiac arrhythmia 05/10/2013  . Trochanteric bursitis of left hip 02/02/2012  . Osteoarthritis of left hip 06/25/2011  . Leg length inequality 06/25/2011  . Other secondary  osteoarthritis of both knees 06/25/2011  . Diverticular disease of colon 01/20/2011  . Left ovarian cyst 01/20/2011  . DIVERTICULITIS, ACUTE 05/05/2010  . Elevated lipids 11/04/2009  . ACUTE CYSTITIS 11/04/2009  . LOW BACK PAIN SYNDROME 05/07/2008  . BRONCHIECTASIS WITH ACUTE EXACERBATION 03/18/2007  . Hemoptysis 03/18/2007  . Malignant neoplasm of female breast ([place]) 03/07/2007  . PNEUMONIA 03/07/2007  . Diverticulosis of large intestine 03/07/2007  . ROTATOR CUFF SYNDROME, RIGHT 03/07/2007  . Disorder of bone and cartilage 03/07/2007  . ANEMIA 03/04/2007  . ALLERGIC RHINITIS 03/04/2007  . BRONCHIECTASIS 03/04/2007  . Osteoarthritis 03/04/2007   Past Medical History  Diagnosis Date  . Allergic rhinitis   . Bronchiectasis with acute exacerbation ([place])   . Hemoptysis   . History of pneumonia   . Hypercholesteremia   . Diverticulosis of colon   . DJD (degenerative joint disease)   . Low back pain syndrome   . Rotator cuff syndrome of right shoulder   . Osteopenia   . Anemia   . Family history of malignant neoplasm of gastrointestinal tract   . Pseudomonas infection   . Breast cancer, left breast ([place]) 1994 with lumpectomy    chemo,tamox  . Fibromyalgia   . Spinal stenosis   . Atrial fibrillation ([place])     goes in and out of this rhythm- takes Tenormin  . Hypertension   . COPD (chronic obstructive pulmonary disease) [facility])     Past Surgical  History  Procedure Laterality Date  . Lung biopsy  1968    TSurg---Dr. [name]  . Left breast lumpectomy and ln dissection  06/1992    Dr. [name]  . Tonsillectomy    . Colonoscopy    . Lung biopsy  2012    dr [name]    No prescriptions prior to admission   No Known Allergies   Social History  Substance Use Topics  . Smoking status: Former Smoker -- 0.25 packs/day for 40 years    Types: Cigarettes    Quit date: 02/24/1967  . Smokeless tobacco: Never Used  . Alcohol Use: 0.6 oz/week    1 Standard drinks or equivalent per  week     Comment: occasionally    Family History  Problem Relation Age of Onset  . Heart attack Mother     Deceased age 64  . Colon cancer Father     deceased age 101 from colon ca.  . Hypertension Brother   . Esophageal cancer Neg Hx   . Stomach cancer Neg Hx   . Rectal cancer Neg Hx      Review of Systems  Constitutional: Negative.   HENT: Negative.   Eyes: Negative.   Respiratory: Positive for shortness of breath (on exertion).   Cardiovascular: Negative.   Gastrointestinal: Negative.   Genitourinary: Negative.   Musculoskeletal: Positive for back pain and joint pain.  Skin: Negative.   Neurological: Negative.   Endo/Heme/Allergies: Positive for environmental allergies.  Psychiatric/Behavioral: Negative.     Objective:  Physical Exam  Constitutional: She is oriented to person, place, and time. She appears well-developed and well-nourished.  HENT:  Head: Normocephalic.  Eyes: Pupils are equal, round, and reactive to light.  Neck: Neck supple. No JVD present. No tracheal deviation present. No thyromegaly present.  Cardiovascular: Normal rate, regular rhythm, normal heart sounds and intact distal pulses.   Respiratory: Effort normal and breath sounds normal. No stridor. No respiratory distress. She has no wheezes.  GI: Soft. There is no tenderness. There is no guarding.  Musculoskeletal:       Right knee: She exhibits decreased range of motion, swelling and bony tenderness. She exhibits no ecchymosis, no deformity, no laceration and no erythema. Tenderness found.  Lymphadenopathy:    She has no cervical adenopathy.  Neurological: She is alert and oriented to person, place, and time.  Skin: Skin is warm and dry.  Psychiatric: She has a normal mood and affect.      Labs:  Estimated body mass index is 22.78 kg/(m^2) as calculated from the following:   Height as of 10/25/14: 5\' 4"  (1.626 m).   Weight as of 12/18/14: 60.238 kg (132 lb 12.8 oz).   Imaging  Review Plain radiographs demonstrate severe degenerative joint disease of the right knee(s). The bone quality appears to be good for age and reported activity level.  Assessment/Plan:  End stage arthritis, right knee   The patient history, physical examination, clinical judgment of the provider and imaging studies are consistent with end stage degenerative joint disease of the right knee(s) and total knee arthroplasty is deemed medically necessary. The treatment options including medical management, injection therapy arthroscopy and arthroplasty were discussed at length. The risks and benefits of total knee arthroplasty were presented and reviewed. The risks due to aseptic loosening, infection, stiffness, patella tracking problems, thromboembolic complications and other imponderables were discussed. The patient acknowledged the explanation, agreed to proceed with the plan and consent was signed. Patient is being admitted for inpatient  treatment for surgery, pain control, PT, OT, prophylactic antibiotics, VTE prophylaxis, progressive ambulation and ADL's and discharge planning. The patient is planning to be discharged home with home health services.     West Pugh Alawna Graybeal   PA-C  01/20/2015, 9:24 PM

## 2015-01-21 ENCOUNTER — Inpatient Hospital Stay (HOSPITAL_COMMUNITY): Payer: Medicare Other | Admitting: Anesthesiology

## 2015-01-21 ENCOUNTER — Inpatient Hospital Stay (HOSPITAL_COMMUNITY)
Admission: RE | Admit: 2015-01-21 | Discharge: 2015-01-23 | DRG: 470 | Disposition: A | Discharge status: Home Health | Payer: Medicare Other | Source: Ambulatory Visit | Attending: Orthopedic Surgery | Admitting: Orthopedic Surgery

## 2015-01-21 ENCOUNTER — Encounter (HOSPITAL_COMMUNITY)
Admission: RE | Disposition: A | Discharge status: Home Health | Payer: Self-pay | Source: Ambulatory Visit | Attending: Orthopedic Surgery

## 2015-01-21 ENCOUNTER — Encounter (HOSPITAL_COMMUNITY): Payer: Self-pay | Admitting: *Deleted

## 2015-01-21 DIAGNOSIS — Z853 Personal history of malignant neoplasm of breast: Secondary | ICD-10-CM | POA: Diagnosis not present

## 2015-01-21 DIAGNOSIS — M25561 Pain in right knee: Secondary | ICD-10-CM | POA: Diagnosis present

## 2015-01-21 DIAGNOSIS — E7800 Pure hypercholesterolemia, unspecified: Secondary | ICD-10-CM | POA: Diagnosis present

## 2015-01-21 DIAGNOSIS — I4891 Unspecified atrial fibrillation: Secondary | ICD-10-CM | POA: Diagnosis present

## 2015-01-21 DIAGNOSIS — M25761 Osteophyte, right knee: Secondary | ICD-10-CM | POA: Diagnosis present

## 2015-01-21 DIAGNOSIS — Z87891 Personal history of nicotine dependence: Secondary | ICD-10-CM | POA: Diagnosis not present

## 2015-01-21 DIAGNOSIS — M659 Synovitis and tenosynovitis, unspecified: Secondary | ICD-10-CM | POA: Diagnosis present

## 2015-01-21 DIAGNOSIS — I10 Essential (primary) hypertension: Secondary | ICD-10-CM | POA: Diagnosis present

## 2015-01-21 DIAGNOSIS — Z8701 Personal history of pneumonia (recurrent): Secondary | ICD-10-CM | POA: Diagnosis not present

## 2015-01-21 DIAGNOSIS — Z01812 Encounter for preprocedural laboratory examination: Secondary | ICD-10-CM | POA: Diagnosis not present

## 2015-01-21 DIAGNOSIS — M170 Bilateral primary osteoarthritis of knee: Principal | ICD-10-CM | POA: Diagnosis present

## 2015-01-21 DIAGNOSIS — Z8249 Family history of ischemic heart disease and other diseases of the circulatory system: Secondary | ICD-10-CM | POA: Diagnosis not present

## 2015-01-21 DIAGNOSIS — M8580 Other specified disorders of bone density and structure, unspecified site: Secondary | ICD-10-CM | POA: Diagnosis present

## 2015-01-21 DIAGNOSIS — M1612 Unilateral primary osteoarthritis, left hip: Secondary | ICD-10-CM | POA: Diagnosis present

## 2015-01-21 DIAGNOSIS — J449 Chronic obstructive pulmonary disease, unspecified: Secondary | ICD-10-CM | POA: Diagnosis present

## 2015-01-21 DIAGNOSIS — M797 Fibromyalgia: Secondary | ICD-10-CM | POA: Diagnosis present

## 2015-01-21 DIAGNOSIS — Z800 Family history of malignant neoplasm of digestive organs: Secondary | ICD-10-CM

## 2015-01-21 DIAGNOSIS — Z96659 Presence of unspecified artificial knee joint: Secondary | ICD-10-CM

## 2015-01-21 DIAGNOSIS — M25461 Effusion, right knee: Secondary | ICD-10-CM | POA: Diagnosis present

## 2015-01-21 HISTORY — PX: TOTAL KNEE ARTHROPLASTY: SHX125

## 2015-01-21 LAB — TYPE AND SCREEN
ABO/RH(D): B POS
Antibody Screen: NEGATIVE

## 2015-01-21 SURGERY — ARTHROPLASTY, KNEE, TOTAL
Anesthesia: Monitor Anesthesia Care | Site: Knee | Laterality: Right

## 2015-01-21 MED ORDER — CEFAZOLIN SODIUM-DEXTROSE 2-3 GM-% IV SOLR
2.0000 g | Freq: Four times a day (QID) | INTRAVENOUS | Status: AC
  Administered 2015-01-21 – 2015-01-22 (×2): 2 g via INTRAVENOUS
  Filled 2015-01-21 (×2): qty 50

## 2015-01-21 MED ORDER — METOCLOPRAMIDE HCL 5 MG/ML IJ SOLN: 5.0000 mg | Freq: Three times a day (TID) | INTRAMUSCULAR | Status: DC | PRN

## 2015-01-21 MED ORDER — DOCUSATE SODIUM 100 MG PO CAPS
100.0000 mg | ORAL_CAPSULE | Freq: Two times a day (BID) | ORAL | Status: DC
  Administered 2015-01-21 – 2015-01-23 (×4): 100 mg via ORAL

## 2015-01-21 MED ORDER — HYDROCODONE-ACETAMINOPHEN 7.5-325 MG PO TABS
1.0000 | ORAL_TABLET | ORAL | Status: DC
  Administered 2015-01-21 – 2015-01-22 (×8): 1 via ORAL
  Administered 2015-01-23: 2 via ORAL
  Administered 2015-01-23 (×2): 1 via ORAL
  Filled 2015-01-21 (×9): qty 1
  Filled 2015-01-21: qty 2
  Filled 2015-01-21: qty 1

## 2015-01-21 MED ORDER — LIDOCAINE HCL (CARDIAC) 20 MG/ML IV SOLN
INTRAVENOUS | Status: AC
  Filled 2015-01-21: qty 5

## 2015-01-21 MED ORDER — METOCLOPRAMIDE HCL 10 MG PO TABS: 5.0000 mg | ORAL_TABLET | Freq: Three times a day (TID) | ORAL | Status: DC | PRN

## 2015-01-21 MED ORDER — DEXAMETHASONE SODIUM PHOSPHATE 10 MG/ML IJ SOLN
INTRAMUSCULAR | Status: DC | PRN
  Administered 2015-01-21: 10 mg via INTRAVENOUS

## 2015-01-21 MED ORDER — CELECOXIB 200 MG PO CAPS: 200.0000 mg | ORAL_CAPSULE | Freq: Two times a day (BID) | ORAL | Status: DC

## 2015-01-21 MED ORDER — BUPIVACAINE-EPINEPHRINE (PF) 0.25% -1:200000 IJ SOLN
INTRAMUSCULAR | Status: AC
  Filled 2015-01-21: qty 30

## 2015-01-21 MED ORDER — PROPOFOL 10 MG/ML IV BOLUS
INTRAVENOUS | Status: AC
  Filled 2015-01-21: qty 40

## 2015-01-21 MED ORDER — DEXAMETHASONE SODIUM PHOSPHATE 10 MG/ML IJ SOLN
10.0000 mg | Freq: Once | INTRAMUSCULAR | Status: AC
  Administered 2015-01-22: 10 mg via INTRAVENOUS
  Filled 2015-01-21: qty 1

## 2015-01-21 MED ORDER — PHENYLEPHRINE HCL 10 MG/ML IJ SOLN
INTRAMUSCULAR | Status: DC | PRN
  Administered 2015-01-21 (×2): 120 ug via INTRAVENOUS
  Administered 2015-01-21: 80 ug via INTRAVENOUS

## 2015-01-21 MED ORDER — METOCLOPRAMIDE HCL 5 MG/ML IJ SOLN
INTRAMUSCULAR | Status: DC | PRN
  Administered 2015-01-21: 10 mg via INTRAVENOUS

## 2015-01-21 MED ORDER — MAGNESIUM CITRATE PO SOLN: 1.0000 | Freq: Once | ORAL | Status: DC | PRN

## 2015-01-21 MED ORDER — SODIUM CHLORIDE 0.9 % IV SOLN
INTRAVENOUS | Status: DC
  Administered 2015-01-22: 04:00:00 via INTRAVENOUS
  Filled 2015-01-21 (×7): qty 1000

## 2015-01-21 MED ORDER — LIDOCAINE HCL (CARDIAC) 20 MG/ML IV SOLN
INTRAVENOUS | Status: DC | PRN
  Administered 2015-01-21: 50 mg via INTRAVENOUS

## 2015-01-21 MED ORDER — POLYETHYLENE GLYCOL 3350 17 G PO PACK
17.0000 g | PACK | Freq: Two times a day (BID) | ORAL | Status: DC
  Administered 2015-01-21 – 2015-01-22 (×3): 17 g via ORAL

## 2015-01-21 MED ORDER — SODIUM CHLORIDE 0.9 % IR SOLN
Status: DC | PRN
  Administered 2015-01-21: 1000 mL

## 2015-01-21 MED ORDER — METOCLOPRAMIDE HCL 5 MG/ML IJ SOLN
INTRAMUSCULAR | Status: AC
  Filled 2015-01-21: qty 2

## 2015-01-21 MED ORDER — METHOCARBAMOL 500 MG PO TABS
500.0000 mg | ORAL_TABLET | Freq: Four times a day (QID) | ORAL | Status: DC | PRN
  Administered 2015-01-22 – 2015-01-23 (×2): 500 mg via ORAL
  Filled 2015-01-21 (×2): qty 1

## 2015-01-21 MED ORDER — ONDANSETRON HCL 4 MG/2ML IJ SOLN: 4.0000 mg | Freq: Four times a day (QID) | INTRAMUSCULAR | Status: DC | PRN

## 2015-01-21 MED ORDER — ASPIRIN EC 325 MG PO TBEC
325.0000 mg | DELAYED_RELEASE_TABLET | Freq: Two times a day (BID) | ORAL | Status: DC
  Administered 2015-01-22 – 2015-01-23 (×3): 325 mg via ORAL
  Filled 2015-01-21 (×5): qty 1

## 2015-01-21 MED ORDER — MOMETASONE FURO-FORMOTEROL FUM 100-5 MCG/ACT IN AERO
2.0000 | INHALATION_SPRAY | Freq: Two times a day (BID) | RESPIRATORY_TRACT | Status: DC
  Administered 2015-01-21 – 2015-01-23 (×4): 2 via RESPIRATORY_TRACT
  Filled 2015-01-21: qty 8.8

## 2015-01-21 MED ORDER — KETOROLAC TROMETHAMINE 30 MG/ML IJ SOLN
INTRAMUSCULAR | Status: AC
  Filled 2015-01-21: qty 1

## 2015-01-21 MED ORDER — PHENYLEPHRINE HCL 10 MG/ML IJ SOLN
10.0000 mg | INTRAMUSCULAR | Status: DC | PRN
  Administered 2015-01-21: 50 ug/min via INTRAVENOUS

## 2015-01-21 MED ORDER — KETOROLAC TROMETHAMINE 30 MG/ML IJ SOLN
INTRAMUSCULAR | Status: DC | PRN
  Administered 2015-01-21: 30 mg via INTRAVENOUS

## 2015-01-21 MED ORDER — FERROUS SULFATE 325 (65 FE) MG PO TABS
325.0000 mg | ORAL_TABLET | Freq: Three times a day (TID) | ORAL | Status: DC
  Administered 2015-01-21 – 2015-01-22 (×4): 325 mg via ORAL
  Filled 2015-01-21 (×8): qty 1

## 2015-01-21 MED ORDER — PHENYLEPHRINE HCL 10 MG/ML IJ SOLN
INTRAMUSCULAR | Status: AC
  Filled 2015-01-21: qty 1

## 2015-01-21 MED ORDER — PHENOL 1.4 % MT LIQD: 1.0000 | OROMUCOSAL | Status: DC | PRN

## 2015-01-21 MED ORDER — SODIUM CHLORIDE 0.9 % IJ SOLN
INTRAMUSCULAR | Status: AC
  Filled 2015-01-21: qty 50

## 2015-01-21 MED ORDER — LACTATED RINGERS IV SOLN
INTRAVENOUS | Status: DC
  Administered 2015-01-21: 1000 mL via INTRAVENOUS
  Administered 2015-01-21 (×2): via INTRAVENOUS

## 2015-01-21 MED ORDER — CELECOXIB 200 MG PO CAPS
200.0000 mg | ORAL_CAPSULE | Freq: Two times a day (BID) | ORAL | Status: DC
  Administered 2015-01-21 – 2015-01-23 (×4): 200 mg via ORAL
  Filled 2015-01-21 (×5): qty 1

## 2015-01-21 MED ORDER — GLYCOPYRROLATE 0.2 MG/ML IJ SOLN
INTRAMUSCULAR | Status: AC
  Filled 2015-01-21: qty 1

## 2015-01-21 MED ORDER — BUPIVACAINE IN DEXTROSE 0.75-8.25 % IT SOLN
INTRATHECAL | Status: DC | PRN
  Administered 2015-01-21: 15 mg via INTRATHECAL

## 2015-01-21 MED ORDER — 0.9 % SODIUM CHLORIDE (POUR BTL) OPTIME
TOPICAL | Status: DC | PRN
  Administered 2015-01-21: 1000 mL

## 2015-01-21 MED ORDER — DEXAMETHASONE SODIUM PHOSPHATE 10 MG/ML IJ SOLN
INTRAMUSCULAR | Status: AC
  Filled 2015-01-21: qty 1

## 2015-01-21 MED ORDER — DEXAMETHASONE SODIUM PHOSPHATE 10 MG/ML IJ SOLN: 10.0000 mg | Freq: Once | INTRAMUSCULAR | Status: DC

## 2015-01-21 MED ORDER — BISACODYL 10 MG RE SUPP: 10.0000 mg | Freq: Every day | RECTAL | Status: DC | PRN

## 2015-01-21 MED ORDER — PROMETHAZINE HCL 25 MG/ML IJ SOLN: 6.2500 mg | INTRAMUSCULAR | Status: DC | PRN

## 2015-01-21 MED ORDER — SODIUM CHLORIDE 0.9 % IJ SOLN
INTRAMUSCULAR | Status: DC | PRN
  Administered 2015-01-21: 50 mL

## 2015-01-21 MED ORDER — BUPIVACAINE-EPINEPHRINE (PF) 0.25% -1:200000 IJ SOLN
INTRAMUSCULAR | Status: DC | PRN
  Administered 2015-01-21: 30 mL

## 2015-01-21 MED ORDER — SODIUM CHLORIDE 0.9 % IV SOLN
1000.0000 mg | Freq: Once | INTRAVENOUS | Status: AC
  Administered 2015-01-21: 1000 mg via INTRAVENOUS
  Filled 2015-01-21: qty 10

## 2015-01-21 MED ORDER — METHOCARBAMOL 1000 MG/10ML IJ SOLN
500.0000 mg | Freq: Four times a day (QID) | INTRAVENOUS | Status: DC | PRN
  Administered 2015-01-21: 500 mg via INTRAVENOUS
  Filled 2015-01-21 (×2): qty 5

## 2015-01-21 MED ORDER — MENTHOL 3 MG MT LOZG: 1.0000 | LOZENGE | OROMUCOSAL | Status: DC | PRN

## 2015-01-21 MED ORDER — ONDANSETRON HCL 4 MG PO TABS: 4.0000 mg | ORAL_TABLET | Freq: Four times a day (QID) | ORAL | Status: DC | PRN

## 2015-01-21 MED ORDER — GUAIFENESIN ER 600 MG PO TB12
1200.0000 mg | ORAL_TABLET | Freq: Two times a day (BID) | ORAL | Status: DC
  Administered 2015-01-21 – 2015-01-23 (×4): 1200 mg via ORAL
  Filled 2015-01-21 (×5): qty 2

## 2015-01-21 MED ORDER — GLYCOPYRROLATE 0.2 MG/ML IJ SOLN
INTRAMUSCULAR | Status: DC | PRN
  Administered 2015-01-21 (×2): 0.1 mg via INTRAVENOUS

## 2015-01-21 MED ORDER — PHENYLEPHRINE 40 MCG/ML (10ML) SYRINGE FOR IV PUSH (FOR BLOOD PRESSURE SUPPORT)
PREFILLED_SYRINGE | INTRAVENOUS | Status: AC
  Filled 2015-01-21: qty 10

## 2015-01-21 MED ORDER — DILTIAZEM HCL ER COATED BEADS 120 MG PO CP24
120.0000 mg | ORAL_CAPSULE | Freq: Every day | ORAL | Status: DC
  Administered 2015-01-22 – 2015-01-23 (×2): 120 mg via ORAL
  Filled 2015-01-21 (×3): qty 1

## 2015-01-21 MED ORDER — PROPOFOL 10 MG/ML IV BOLUS
INTRAVENOUS | Status: DC | PRN
  Administered 2015-01-21 (×3): 10 mg via INTRAVENOUS
  Administered 2015-01-21: 5 mg via INTRAVENOUS
  Administered 2015-01-21: 40 mg via INTRAVENOUS
  Administered 2015-01-21 (×2): 10 mg via INTRAVENOUS
  Administered 2015-01-21: 5 mg via INTRAVENOUS
  Administered 2015-01-21: 25 mg via INTRAVENOUS
  Administered 2015-01-21 (×2): 10 mg via INTRAVENOUS
  Administered 2015-01-21: 20 mg via INTRAVENOUS
  Administered 2015-01-21 (×4): 10 mg via INTRAVENOUS
  Administered 2015-01-21: 40 mg via INTRAVENOUS
  Administered 2015-01-21 (×2): 20 mg via INTRAVENOUS
  Administered 2015-01-21: 5 mg via INTRAVENOUS
  Administered 2015-01-21: 10 mg via INTRAVENOUS

## 2015-01-21 MED ORDER — LOSARTAN POTASSIUM 50 MG PO TABS
50.0000 mg | ORAL_TABLET | Freq: Every day | ORAL | Status: DC
  Administered 2015-01-22 – 2015-01-23 (×2): 50 mg via ORAL
  Filled 2015-01-21 (×3): qty 1

## 2015-01-21 MED ORDER — HYDROMORPHONE HCL 1 MG/ML IJ SOLN: 0.5000 mg | INTRAMUSCULAR | Status: DC | PRN

## 2015-01-21 MED ORDER — CEFAZOLIN SODIUM-DEXTROSE 2-3 GM-% IV SOLR
2.0000 g | INTRAVENOUS | Status: AC
  Administered 2015-01-21: 2 g via INTRAVENOUS

## 2015-01-21 MED ORDER — ALUM & MAG HYDROXIDE-SIMETH 200-200-20 MG/5ML PO SUSP: 30.0000 mL | ORAL | Status: DC | PRN

## 2015-01-21 MED ORDER — ONDANSETRON HCL 4 MG/2ML IJ SOLN
INTRAMUSCULAR | Status: DC | PRN
  Administered 2015-01-21: 4 mg via INTRAVENOUS

## 2015-01-21 MED ORDER — DIPHENHYDRAMINE HCL 25 MG PO CAPS: 25.0000 mg | ORAL_CAPSULE | Freq: Four times a day (QID) | ORAL | Status: DC | PRN

## 2015-01-21 MED ORDER — CEFAZOLIN SODIUM-DEXTROSE 2-3 GM-% IV SOLR
INTRAVENOUS | Status: AC
  Filled 2015-01-21: qty 50

## 2015-01-21 MED ORDER — HYDROMORPHONE HCL 1 MG/ML IJ SOLN: 0.2500 mg | INTRAMUSCULAR | Status: DC | PRN

## 2015-01-21 MED ORDER — ONDANSETRON HCL 4 MG/2ML IJ SOLN
INTRAMUSCULAR | Status: AC
  Filled 2015-01-21: qty 2

## 2015-01-21 SURGICAL SUPPLY — 44 items
BAG DECANTER FOR FLEXI CONT (MISCELLANEOUS) IMPLANT
BAG ZIPLOCK 12X15 (MISCELLANEOUS) IMPLANT
BANDAGE ELASTIC 6 VELCRO ST LF (GAUZE/BANDAGES/DRESSINGS) ×3 IMPLANT
BLADE SAW SGTL 13.0X1.19X90.0M (BLADE) ×3 IMPLANT
BOWL SMART MIX CTS (DISPOSABLE) ×3 IMPLANT
CAPT KNEE TOTAL 3 ATTUNE ×3 IMPLANT
CEMENT HV SMART SET (Cement) ×6 IMPLANT
CLOTH BEACON ORANGE TIMEOUT ST (SAFETY) ×3 IMPLANT
CUFF TOURN SGL QUICK 34 (TOURNIQUET CUFF) ×2
CUFF TRNQT CYL 34X4X40X1 (TOURNIQUET CUFF) ×1 IMPLANT
DECANTER SPIKE VIAL GLASS SM (MISCELLANEOUS) ×3 IMPLANT
DRAPE U-SHAPE 47X51 STRL (DRAPES) ×3 IMPLANT
DRSG AQUACEL AG ADV 3.5X10 (GAUZE/BANDAGES/DRESSINGS) ×3 IMPLANT
DURAPREP 26ML APPLICATOR (WOUND CARE) ×6 IMPLANT
ELECT REM PT RETURN 9FT ADLT (ELECTROSURGICAL) ×3
ELECTRODE REM PT RTRN 9FT ADLT (ELECTROSURGICAL) ×1 IMPLANT
GLOVE BIOGEL M 7.0 STRL (GLOVE) IMPLANT
GLOVE BIOGEL PI IND STRL 7.5 (GLOVE) ×1 IMPLANT
GLOVE BIOGEL PI IND STRL 8.5 (GLOVE) ×1 IMPLANT
GLOVE BIOGEL PI INDICATOR 7.5 (GLOVE) ×2
GLOVE BIOGEL PI INDICATOR 8.5 (GLOVE) ×2
GLOVE ECLIPSE 8.0 STRL XLNG CF (GLOVE) ×3 IMPLANT
GLOVE ORTHO TXT STRL SZ7.5 (GLOVE) ×6 IMPLANT
GOWN STRL REUS W/TWL LRG LVL3 (GOWN DISPOSABLE) ×3 IMPLANT
GOWN STRL REUS W/TWL XL LVL3 (GOWN DISPOSABLE) ×3 IMPLANT
HANDPIECE INTERPULSE COAX TIP (DISPOSABLE) ×2
LIQUID BAND (GAUZE/BANDAGES/DRESSINGS) ×3 IMPLANT
MANIFOLD NEPTUNE II (INSTRUMENTS) ×3 IMPLANT
PACK TOTAL KNEE CUSTOM (KITS) ×3 IMPLANT
POSITIONER SURGICAL ARM (MISCELLANEOUS) ×3 IMPLANT
SET HNDPC FAN SPRY TIP SCT (DISPOSABLE) ×1 IMPLANT
SET PAD KNEE POSITIONER (MISCELLANEOUS) ×3 IMPLANT
SUCTION FRAZIER 12FR DISP (SUCTIONS) ×3 IMPLANT
SUT MNCRL AB 4-0 PS2 18 (SUTURE) ×3 IMPLANT
SUT VIC AB 1 CT1 36 (SUTURE) ×3 IMPLANT
SUT VIC AB 2-0 CT1 27 (SUTURE) ×6
SUT VIC AB 2-0 CT1 TAPERPNT 27 (SUTURE) ×3 IMPLANT
SUT VLOC 180 0 24IN GS25 (SUTURE) ×3 IMPLANT
SYR 50ML LL SCALE MARK (SYRINGE) ×3 IMPLANT
TRAY FOLEY W/METER SILVER 14FR (SET/KITS/TRAYS/PACK) ×3 IMPLANT
TRAY FOLEY W/METER SILVER 16FR (SET/KITS/TRAYS/PACK) ×3 IMPLANT
WATER STERILE IRR 1500ML POUR (IV SOLUTION) ×3 IMPLANT
WRAP KNEE MAXI GEL POST OP (GAUZE/BANDAGES/DRESSINGS) ×3 IMPLANT
YANKAUER SUCT BULB TIP 10FT TU (MISCELLANEOUS) ×3 IMPLANT

## 2015-01-21 NOTE — Anesthesia Preprocedure Evaluation (Addendum)
Anesthesia Evaluation  Patient identified by MRN, date of birth, ID band Patient awake    Reviewed: Allergy & Precautions, Patient's Chart, lab work & pertinent test results  History of Anesthesia Complications Negative for: history of anesthetic complications  Airway Mallampati: II  TM Distance: >3 FB Neck ROM: Full    Dental  (+) Teeth Intact, Dental Advisory Given   Pulmonary COPD,  COPD inhaler, former smoker,    Pulmonary exam normal        Cardiovascular hypertension, Pt. on medications Normal cardiovascular exam     Neuro/Psych    GI/Hepatic negative GI ROS, Neg liver ROS,   Endo/Other  negative endocrine ROS  Renal/GU negative Renal ROS     Musculoskeletal  (+) Fibromyalgia -  Abdominal   Peds  Hematology   Anesthesia Other Findings   Reproductive/Obstetrics                            Anesthesia Physical Anesthesia Plan  ASA: III  Anesthesia Plan: MAC and Spinal   Post-op Pain Management:    Induction:   Airway Management Planned: Simple Face Mask  Additional Equipment:   Intra-op Plan:   Post-operative Plan:   Informed Consent: I have reviewed the patients History and Physical, chart, labs and discussed the procedure including the risks, benefits and alternatives for the proposed anesthesia with the patient or authorized representative who has indicated his/her understanding and acceptance.   Dental advisory given  Plan Discussed with: CRNA, Anesthesiologist and Surgeon  Anesthesia Plan Comments:        Anesthesia Quick Evaluation

## 2015-01-21 NOTE — Op Note (Signed)
NAME:  Miranda Thompson                      MEDICAL RECORD NO.:  [id number]                             FACILITY:  [facility]      PHYSICIAN:  Pietro Cassis. Alvan Dame, M.D.  DATE OF BIRTH:  10/02/1933      DATE OF PROCEDURE:  01/21/2015                                     OPERATIVE REPORT         PREOPERATIVE DIAGNOSIS:  Right knee osteoarthritis.      POSTOPERATIVE DIAGNOSIS:  Right knee osteoarthritis.      FINDINGS:  The patient was noted to have complete loss of cartilage and   bone-on-bone arthritis with associated osteophytes in all three compartments of   the knee most severe medially with a significant synovitis and associated effusion.      PROCEDURE:  Right total knee replacement.      COMPONENTS USED:  DePuy Attune rotating platform posterior stabilized knee   system, a size 5 standard femur, 4 tibia, size 6 mm PS AOX insert, and 35 anatomic patellar   button.      SURGEON:  Pietro Cassis. Alvan Dame, M.D.      ASSISTANT:  Danae Orleans, PA-C.      ANESTHESIA:  Spinal.      SPECIMENS:  None.      COMPLICATION:  None.      DRAINS:  None.  EBL: <100cc      TOURNIQUET TIME:   Total Tourniquet Time Documented: Thigh (Left) - 28 minutes Total: Thigh (Left) - 28 minutes  .      The patient was stable to the recovery room.      INDICATION FOR PROCEDURE:  Miranda Thompson is a 81 y.o. female patient of   mine.  The patient had been seen, evaluated, and treated conservatively in the   office with medication, activity modification, and injections.  The patient had   radiographic changes of bone-on-bone arthritis with endplate sclerosis and osteophytes noted.      The patient failed conservative measures including medication, injections, and activity modification, and at this point was ready for more definitive measures.   Based on the radiographic changes and failed conservative measures, the patient   decided to proceed with total knee replacement.  Risks of infection,   DVT,  component failure, need for revision surgery, postop course, and   expectations were all   discussed and reviewed.  Consent was obtained for benefit of pain   relief.      PROCEDURE IN DETAIL:  The patient was brought to the operative theater.   Once adequate anesthesia, preoperative antibiotics, 2 gm of Ancef, 1 gm of Tranexamic Acid, and 10 mg of Decadron administered, the patient was positioned supine with the right thigh tourniquet placed.  The  right lower extremity was prepped and draped in sterile fashion.  A time-   out was performed identifying the patient, planned procedure, and   extremity.      The right lower extremity was placed in the [facility] leg holder.  The leg was   exsanguinated, tourniquet elevated to 250 mmHg.  A midline incision was  made followed by median parapatellar arthrotomy.  Following initial   exposure, attention was first directed to the patella.  Precut   measurement was noted to be 21 mm.  I resected down to 12 mm and used a   35 patellar button to restore patellar height as well as cover the cut   surface.      The lug holes were drilled and a metal shim was placed to protect the   patella from retractors and saw blades.      At this point, attention was now directed to the femur.  The femoral   canal was opened with a drill, irrigated to try to prevent fat emboli.  An   intramedullary rod was passed at 3 degrees valgus, 9 mm of bone was   resected off the distal femur.  Following this resection, the tibia was   subluxated anteriorly.  Using the extramedullary guide, 2 mm of bone was resected off   the proximal medial tibia.  We confirmed the gap would be   stable medially and laterally with a size 5 mm insert as well as confirmed   the cut was perpendicular in the coronal plane, checking with an alignment rod.      Once this was done, I sized the femur to be a size 5 in the anterior-   posterior dimension, chose a standard component based on medial  and   lateral dimension.  The size 5 rotation block was then pinned in   position anterior referenced using the C-clamp to set rotation.  The   anterior, posterior, and  chamfer cuts were made without difficulty nor   notching making certain that I was along the anterior cortex to help   with flexion gap stability.      The final box cut was made off the lateral aspect of distal femur.      At this point, the tibia was sized to be a size 4, the size 4 tray was   then pinned in position through the medial third of the tubercle,   drilled, and keel punched.  Trial reduction was now carried with a 5 femur,  4 tibia, a size 6 mm insert, and the 35 patella botton.  The knee was brought to   extension, full extension with good flexion stability with the patella   tracking through the trochlea without application of pressure.  Given   all these findings, the trial components removed.  Final components were   opened and cement was mixed.  The knee was irrigated with normal saline   solution and pulse lavage.  The synovial lining was   then injected with 30 cc of 0.25% Marcaine with epinephrine and 1 cc of Toradol plus 30 cc of NS for a  total of 61 cc.      The knee was irrigated.  Final implants were then cemented onto clean and   dried cut surfaces of bone with the knee brought to extension with a size 6 mm trial insert.      Once the cement had fully cured, the excess cement was removed   throughout the knee.  I confirmed I was satisfied with the range of   motion and stability, and the final size 6 mm PS AOX insert was chosen.  It was   placed into the knee.      The tourniquet had been let down at 28 minutes.  No significant   hemostasis required.  The  extensor mechanism was then reapproximated using #1 Vicryl and #0 V-lock sutures with the knee   in flexion.  The   remaining wound was closed with 2-0 Vicryl and running 4-0 Monocryl.   The knee was cleaned, dried, dressed sterilely  using Dermabond and   Aquacel dressing.  The patient was then   brought to recovery room in stable condition, tolerating the procedure   well.   Please note that Physician Assistant, Danae Orleans, PA-C, was present for the entirety of the case, and was utilized for pre-operative positioning, peri-operative retractor management, general facilitation of the procedure.  He was also utilized for primary wound closure at the end of the case.              Pietro Cassis Alvan Dame, M.D.    01/21/2015 1:57 PM

## 2015-01-21 NOTE — Discharge Instructions (Signed)

## 2015-01-21 NOTE — Anesthesia Postprocedure Evaluation (Signed)
Anesthesia Post Note  Patient: Miranda Thompson  Procedure(s) Performed: Procedure(s) (LRB): TOTAL RIGHT KNEE ARTHROPLASTY (Right)  Patient location during evaluation: PACU Anesthesia Type: Spinal and MAC Level of consciousness: awake and alert Pain management: pain level controlled Vital Signs Assessment: post-procedure vital signs reviewed and stable Respiratory status: spontaneous breathing and respiratory function stable Cardiovascular status: blood pressure returned to baseline and stable Postop Assessment: Spinal receding Anesthetic complications: no    Last Vitals:  Filed Vitals:   01/21/15 1445 01/21/15 1500  BP: 118/56 124/58  Pulse: 82 86  Temp:  36.5 C  Resp: 19 16    Last Pain:  Filed Vitals:   01/21/15 1502  PainSc: 0-No pain            L Sensory Level: L3-Anterior knee, lower leg R Sensory Level: L4-Anterior knee, lower leg  Ayeden Gladman DANIEL

## 2015-01-21 NOTE — Interval H&P Note (Signed)
History and Physical Interval Note:  01/21/2015 11:09 AM  Miranda Thompson  has presented today for surgery, with the diagnosis of right knee oa   The various methods of treatment have been discussed with the patient and family. After consideration of risks, benefits and other options for treatment, the patient has consented to  Procedure(s): TOTAL RIGHT KNEE ARTHROPLASTY (Right) as a surgical intervention .  The patient's history has been reviewed, patient examined, no change in status, stable for surgery.  I have reviewed the patient's chart and labs.  Questions were answered to the patient's satisfaction.     Mauri Pole

## 2015-01-21 NOTE — Anesthesia Procedure Notes (Signed)
Spinal Patient location during procedure: OR Start time: 01/21/2015 12:19 PM End time: 01/21/2015 12:28 PM Staffing Anesthesiologist: Duane Boston Performed by: anesthesiologist  Preanesthetic Checklist Completed: patient identified, surgical consent, pre-op evaluation, timeout performed, IV checked, risks and benefits discussed and monitors and equipment checked Spinal Block Patient position: sitting Prep: DuraPrep Patient monitoring: cardiac monitor, continuous pulse ox and blood pressure Approach: midline Location: L2-3 Injection technique: single-shot Needle Needle type: Pencan  Needle gauge: 24 G Needle length: 9 cm Additional Notes Functioning IV was confirmed and monitors were applied. Sterile prep and drape, including hand hygiene and sterile gloves were used. The patient was positioned and the spine was prepped. The skin was anesthetized with lidocaine.  Free flow of clear CSF was obtained prior to injecting local anesthetic into the CSF.  The spinal needle aspirated freely following injection.  The needle was carefully withdrawn.  The patient tolerated the procedure well.

## 2015-01-21 NOTE — Transfer of Care (Signed)
Immediate Anesthesia Transfer of Care Note  Patient: Miranda Thompson  Procedure(s) Performed: Procedure(s): TOTAL RIGHT KNEE ARTHROPLASTY (Right)  Patient Location: PACU  Anesthesia Type:MAC and Spinal  Level of Consciousness: Patient easily awoken, sedated, comfortable, cooperative, following commands, responds to stimulation.   Airway & Oxygen Therapy: Patient spontaneously breathing, ventilating well, oxygen via simple oxygen mask.  Post-op Assessment: Report given to PACU RN, vital signs reviewed and stable,spinal T 12.   Post vital signs: Reviewed and stable.  Complications: No apparent anesthesia complications

## 2015-01-22 ENCOUNTER — Encounter (HOSPITAL_COMMUNITY): Payer: Self-pay | Admitting: Orthopedic Surgery

## 2015-01-22 LAB — BASIC METABOLIC PANEL
Anion gap: 4 — ABNORMAL LOW (ref 5–15)
BUN: 14 mg/dL (ref 6–20)
CO2: 29 mmol/L (ref 22–32)
Calcium: 8.7 mg/dL — ABNORMAL LOW (ref 8.9–10.3)
Chloride: 98 mmol/L — ABNORMAL LOW (ref 101–111)
Creatinine, Ser: 0.58 mg/dL (ref 0.44–1.00)
GFR calc Af Amer: >60 mL/min (ref >60)
GFR calc non Af Amer: >60 mL/min (ref >60)
Glucose, Bld: 140 mg/dL — ABNORMAL HIGH (ref 65–99)
Potassium: 4.7 mmol/L (ref 3.5–5.1)
Sodium: 131 mmol/L — ABNORMAL LOW (ref 135–145)

## 2015-01-22 LAB — CBC
HCT: 31.4 % — ABNORMAL LOW (ref 36.0–46.0)
Hemoglobin: 10.7 g/dL — ABNORMAL LOW (ref 12.0–15.0)
MCH: 32.9 pg (ref 26.0–34.0)
MCHC: 34.1 g/dL (ref 30.0–36.0)
MCV: 96.6 fL (ref 78.0–100.0)
Platelets: 228 10*3/uL (ref 150–400)
RBC: 3.25 MIL/uL — ABNORMAL LOW (ref 3.87–5.11)
RDW: 12.3 % (ref 11.5–15.5)
WBC: 12.8 10*3/uL — ABNORMAL HIGH (ref 4.0–10.5)

## 2015-01-22 MED ORDER — KETAMINE HCL 10 MG/ML IJ SOLN
INTRAMUSCULAR | Status: AC
  Filled 2015-01-22: qty 1

## 2015-01-22 NOTE — Care Management Note (Signed)
Case Management Note  Patient Details  Name: Miranda Thompson MRN: [id number] Date of Birth: January 04, 1934  Subjective/Objective:     S/p Right total knee replacement               Action/Plan: Discharge planning, spoke with patient at bedside. Patient has chosen [place] for [employer], needs RW. Contacted [facility] for referral, contacted [facility] to deliver RW.  Expected Discharge Date:  01/23/15               Expected Discharge Plan:  [place]  In-House Referral:  NA  Discharge planning Services  CM Consult  Post Acute Care Choice:  Home Health, Durable Medical Equipment Choice offered to:  Patient  DME Arranged:  Walker rolling DME Agency:  [employer]:  PT [place]:  [place]  Status of Service:  Completed, signed off  Medicare Important Message Given:    Date Medicare IM Given:    Medicare IM give by:    Date Additional Medicare IM Given:    Additional Medicare Important Message give by:     If discussed at [place] of Stay Meetings, dates discussed:    Additional Comments:  Guadalupe Maple, RN 01/22/2015, 1:16 PM

## 2015-01-22 NOTE — Progress Notes (Signed)
Patient ID: Miranda Thompson, female   DOB: 18-Jul-1933, 81 y.o.   MRN: [id number] Subjective: 1 Day Post-Op Procedure(s) (LRB): TOTAL RIGHT KNEE ARTHROPLASTY (Right)    Patient reports pain as mild.  Relatively comfortable over night, just awoken by alarms and staff every 3 hours or so  Objective:   VITALS:   Filed Vitals:   01/22/15 0914 01/22/15 1404  BP: 124/53 122/65  Pulse: 53 52  Temp: 98 F (36.7 C) 98.3 F (36.8 C)  Resp: 16 16    Neurovascular intact Incision: dressing C/D/I, right knee  LABS  Recent Labs  01/22/15 0518  HGB 10.7*  HCT 31.4*  WBC 12.8*  PLT 228     Recent Labs  01/22/15 0518  NA 131*  K 4.7  BUN 14  CREATININE 0.58  GLUCOSE 140*    No results for input(s): LABPT, INR in the last 72 hours.   Assessment/Plan: 1 Day Post-Op Procedure(s) (LRB): TOTAL RIGHT KNEE ARTHROPLASTY (Right)   Advance diet Up with therapy Plan for discharge tomorrow Discharge home with home health

## 2015-01-22 NOTE — Evaluation (Signed)
Occupational Therapy Evaluation Patient Details Name: Miranda Thompson MRN: [id number] DOB: 1933-10-30 Today's Date: 01/22/2015    History of Present Illness s/p R TKA   Clinical Impression   This 81 year old female was admitted for the above surgery. Daughter present for session, and all education was completed.  No further OT is needed at this time    Follow Up Recommendations  No OT follow up;Supervision/Assistance - 24 hour    Equipment Recommendations  None recommended by OT    Recommendations for Other Services       Precautions / Restrictions Precautions Precautions: Knee;Fall Restrictions Weight Bearing Restrictions: No      Mobility Bed Mobility               General bed mobility comments: oob  Transfers Overall transfer level: Needs assistance Equipment used: Rolling walker (2 wheeled) Transfers: Sit to/from Stand Sit to Stand: Min assist         General transfer comment: assist to rise and stabilize. Cues for UE/LE placement.    Balance                                            ADL Overall ADL's : Needs assistance/impaired     Grooming: Wash/dry hands;Sitting;Set up   Upper Body Bathing: Set up;Sitting   Lower Body Bathing: Minimal assistance;Sit to/from stand   Upper Body Dressing : Set up;Sitting   Lower Body Dressing: Moderate assistance;Sit to/from stand   Toilet Transfer: Minimal assistance;BSC;RW;Ambulation   Toileting- Clothing Manipulation and Hygiene: Minimal assistance;Sit to/from stand         General ADL Comments: donned underwear:  pt initially unsteady with posterior LOB when standing. Cued to get balance and release one hand at a time for safety.  Pt's son brought bathroom DME.  Explained tub bench use and removing doors/stringing curtain up. This is best option vs. sponge bathing initially.  Talked about tub readiness.  Pt will have assistance at home for adls and she has a long brush she can  use for washing feet.  Cues given for walker distance when ambulating to bathroom     Vision     Perception     Praxis      Pertinent Vitals/Pain Pain Assessment: 0-10 Pain Score: 2  Pain Location: R knee Pain Descriptors / Indicators: Sore Pain Intervention(s): Limited activity within patient's tolerance;Monitored during session;Repositioned;Premedicated before session;Ice applied     Hand Dominance     Extremity/Trunk Assessment Upper Extremity Assessment Upper Extremity Assessment: Overall WFL for tasks assessed           Communication Communication Communication: No difficulties   Cognition Arousal/Alertness: Awake/alert Behavior During Therapy: WFL for tasks assessed/performed Overall Cognitive Status: Within Functional Limits for tasks assessed                     General Comments       Exercises       Shoulder Instructions      Home Living Family/patient expects to be discharged to:: Private residence Living Arrangements: Spouse/significant other   Type of Home: House             Bathroom Shower/Tub: Tub/shower unit Shower/tub characteristics: Door Biochemist, clinical: Standard     Home Equipment: Toilet riser;Shower seat;Tub bench          Prior Functioning/Environment Level  of Independence: Independent             OT Diagnosis: Generalized weakness   OT Problem List:     OT Treatment/Interventions:      OT Goals(Current goals can be found in the care plan section) Acute Rehab OT Goals Patient Stated Goal: get back to being independent  OT Frequency:     Barriers to D/C:            Co-evaluation              End of Session    Activity Tolerance: Patient tolerated treatment well Patient left: in chair;with call bell/phone within reach;with bed alarm set;with family/visitor present   Time: [id number] OT Time Calculation (min): 35 min Charges:  OT General Charges $OT Visit: 1 Procedure OT  Evaluation $Initial OT Evaluation Tier I: 1 Procedure OT Treatments $Self Care/Home Management : 8-22 mins G-Codes:    Kimmberly Wisser 2015/02/20, 11:03 AM  Lesle Chris, OTR/L [phone] 02-20-15

## 2015-01-22 NOTE — Evaluation (Signed)
Physical Therapy Evaluation Patient Details Name: Miranda Thompson MRN: [id number] DOB: 05-03-1933 Today's Date: 01/22/2015   History of Present Illness  PT is a 81 year old female s/p R TKA  Clinical Impression  Pt is s/p R TKA resulting in the deficits listed below (see PT Problem List).   Pt will benefit from skilled PT to increase their independence and safety with mobility to allow discharge to the venue listed below.   Pt with some dizziness with ambulation limiting distance this morning however felt better once sitting.  Pt performed LE exercises.  Pt's questions were also answered.    Follow Up Recommendations Home health PT    Equipment Recommendations  Rolling walker with 5" wheels    Recommendations for Other Services       Precautions / Restrictions Precautions Precautions: Knee;Fall Restrictions Weight Bearing Restrictions: No Other Position/Activity Restrictions: WBAT      Mobility  Bed Mobility Overal bed mobility: Needs Assistance Bed Mobility: Supine to Sit     Supine to sit: Supervision     General bed mobility comments: verbal cues for self assist  Transfers Overall transfer level: Needs assistance Equipment used: Rolling walker (2 wheeled) Transfers: Sit to/from Stand Sit to Stand: Min assist         General transfer comment: assist to setady, verbal cues for safe technique  Ambulation/Gait Ambulation/Gait assistance: Min guard Ambulation Distance (Feet): 30 Feet Assistive device: Rolling walker (2 wheeled) Gait Pattern/deviations: Step-to pattern;Antalgic     General Gait Details: verbal cues for sequence, RW distance, posture, pt reported dizziness so recliner brought behind pt, pt felt better upon sitting  Stairs            Wheelchair Mobility    Modified Rankin (Stroke Patients Only)       Balance                                             Pertinent Vitals/Pain Pain Assessment: 0-10 Pain Score:  2  Pain Location: R knee Pain Descriptors / Indicators: Aching;Sore Pain Intervention(s): Limited activity within patient's tolerance;Monitored during session;Premedicated before session;Repositioned;Ice applied    Home Living Family/patient expects to be discharged to:: Private residence Living Arrangements: Spouse/significant other   Type of Home: House Home Access: Stairs to enter Entrance Stairs-Rails: Right Entrance Stairs-Number of Steps: 6 Home Layout: Able to live on main level with bedroom/bathroom Home Equipment: Toilet riser;Shower seat;Tub bench      Prior Function Level of Independence: Independent               Hand Dominance        Extremity/Trunk Assessment   Upper Extremity Assessment: Overall WFL for tasks assessed           Lower Extremity Assessment: RLE deficits/detail RLE Deficits / Details: able to perform SLR, AAROM knee flexion 75*       Communication   Communication: No difficulties  Cognition Arousal/Alertness: Awake/alert Behavior During Therapy: WFL for tasks assessed/performed Overall Cognitive Status: Within Functional Limits for tasks assessed                      General Comments      Exercises Total Joint Exercises Ankle Circles/Pumps: AROM;Both;10 reps Quad Sets: AROM;Both;10 reps Short Arc Quad: AROM;Right;10 reps Heel Slides: AAROM;Right;10 reps Hip ABduction/ADduction: AROM;Right;10 reps Straight Leg Raises: AROM;Right;10  reps      Assessment/Plan    PT Assessment Patient needs continued PT services  PT Diagnosis Difficulty walking   PT Problem List Decreased strength;Decreased activity tolerance;Decreased mobility;Decreased range of motion;Decreased knowledge of use of DME;Pain  PT Treatment Interventions DME instruction;Functional mobility training;Patient/family education;Therapeutic activities;Therapeutic exercise;Gait training;Stair training   PT Goals (Current goals can be found in the Care  Plan section) Acute Rehab PT Goals Patient Stated Goal: get back to being independent PT Goal Formulation: With patient Time For Goal Achievement: 01/29/15 Potential to Achieve Goals: Good    Frequency 7X/week   Barriers to discharge        Co-evaluation               End of Session Equipment Utilized During Treatment: Gait belt Activity Tolerance: Patient tolerated treatment well Patient left: in chair;with call bell/phone within reach;with family/visitor present;with chair alarm set           Time: [id number] PT Time Calculation (min) (ACUTE ONLY): 36 min   Charges:   PT Evaluation $Initial PT Evaluation Tier I: 1 Procedure PT Treatments $Therapeutic Exercise: 8-22 mins   PT G Codes:        Pranish Akhavan,KATHrine E 01/22/2015, 12:02 PM Carmelia Bake, PT, DPT 01/22/2015 Pager: [phone]

## 2015-01-22 NOTE — Progress Notes (Signed)
Physical Therapy Treatment Note    01/22/15 1500  PT Visit Information  Last PT Received On 01/22/15  Assistance Needed +1  History of Present Illness PT is a 81 year old female s/p R TKA  PT Time Calculation  PT Start Time (ACUTE ONLY) 1404  PT Stop Time (ACUTE ONLY) 1419  PT Time Calculation (min) (ACUTE ONLY) 15 min  Subjective Data  Subjective Pt ambulated again in hallway, no dizziness this session.     Precautions  Precautions Knee;Fall  Restrictions  Other Position/Activity Restrictions WBAT  Pain Assessment  Pain Assessment 0-10  Pain Score 2  Pain Location R knee  Pain Descriptors / Indicators Aching;Sore  Pain Intervention(s) Limited activity within patient's tolerance;Monitored during session;Repositioned  Cognition  Arousal/Alertness Awake/alert  Behavior During Therapy WFL for tasks assessed/performed  Overall Cognitive Status Within Functional Limits for tasks assessed  Bed Mobility  Overal bed mobility Needs Assistance  Bed Mobility Sit to Supine  Sit to supine Min assist  General bed mobility comments assist for LE onto bed  Transfers  Overall transfer level Needs assistance  Equipment used Rolling walker (2 wheeled)  Transfers Sit to/from Stand  Sit to Stand Min guard  General transfer comment verbal cues for safe technique  Ambulation/Gait  Ambulation/Gait assistance Min guard  Ambulation Distance (Feet) 100 Feet  Assistive device Rolling walker (2 wheeled)  Gait Pattern/deviations Step-to pattern;Antalgic  General Gait Details verbal cues for sequence, RW distance, step length, no dizziness this afternoon  PT - End of Session  Activity Tolerance Patient tolerated treatment well  Patient left in bed;with call bell/phone within reach;with bed alarm set  PT - Assessment/Plan  PT Plan Current plan remains appropriate  PT Frequency (ACUTE ONLY) 7X/week  Follow Up Recommendations Home health PT  PT equipment Rolling walker with 5" wheels  PT Goal  Progression  Progress towards PT goals Progressing toward goals  PT General Charges  $$ ACUTE PT VISIT 1 Procedure  PT Treatments  $Gait Training 8-22 mins   Carmelia Bake, PT, DPT 01/22/2015 Pager: [phone]

## 2015-01-23 LAB — CBC
HCT: 29.1 % — ABNORMAL LOW (ref 36.0–46.0)
Hemoglobin: 9.7 g/dL — ABNORMAL LOW (ref 12.0–15.0)
MCH: 31.7 pg (ref 26.0–34.0)
MCHC: 33.3 g/dL (ref 30.0–36.0)
MCV: 95.1 fL (ref 78.0–100.0)
Platelets: 192 10*3/uL (ref 150–400)
RBC: 3.06 MIL/uL — ABNORMAL LOW (ref 3.87–5.11)
RDW: 12.5 % (ref 11.5–15.5)
WBC: 12.7 10*3/uL — ABNORMAL HIGH (ref 4.0–10.5)

## 2015-01-23 LAB — BASIC METABOLIC PANEL
Anion gap: 5 (ref 5–15)
BUN: 20 mg/dL (ref 6–20)
CO2: 26 mmol/L (ref 22–32)
Calcium: 8.7 mg/dL — ABNORMAL LOW (ref 8.9–10.3)
Chloride: 100 mmol/L — ABNORMAL LOW (ref 101–111)
Creatinine, Ser: 0.54 mg/dL (ref 0.44–1.00)
GFR calc Af Amer: >60 mL/min (ref >60)
GFR calc non Af Amer: >60 mL/min (ref >60)
Glucose, Bld: 134 mg/dL — ABNORMAL HIGH (ref 65–99)
Potassium: 4.6 mmol/L (ref 3.5–5.1)
Sodium: 131 mmol/L — ABNORMAL LOW (ref 135–145)

## 2015-01-23 MED ORDER — POLYETHYLENE GLYCOL 3350 17 G PO PACK: 17.0000 g | PACK | Freq: Two times a day (BID) | ORAL | Status: DC

## 2015-01-23 MED ORDER — FERROUS SULFATE 325 (65 FE) MG PO TABS: 325.0000 mg | ORAL_TABLET | Freq: Three times a day (TID) | ORAL | Status: DC

## 2015-01-23 MED ORDER — ASPIRIN 325 MG PO TBEC: 325.0000 mg | DELAYED_RELEASE_TABLET | Freq: Two times a day (BID) | ORAL | Status: AC

## 2015-01-23 MED ORDER — DOCUSATE SODIUM 100 MG PO CAPS: 100.0000 mg | ORAL_CAPSULE | Freq: Two times a day (BID) | ORAL | Status: DC

## 2015-01-23 MED ORDER — HYDROCODONE-ACETAMINOPHEN 7.5-325 MG PO TABS: 1.0000 | ORAL_TABLET | ORAL | Status: DC | PRN

## 2015-01-23 MED ORDER — TIZANIDINE HCL 4 MG PO TABS: 4.0000 mg | ORAL_TABLET | Freq: Four times a day (QID) | ORAL | Status: DC | PRN

## 2015-01-23 NOTE — Progress Notes (Signed)
     Subjective: 2 Days Post-Op Procedure(s) (LRB): TOTAL RIGHT KNEE ARTHROPLASTY (Right)   Patient reports pain as mild, pain controlled.  No events throughout the night. Ready to be discharged home.  Objective:   VITALS:   Filed Vitals:   01/22/15 2158 01/23/15 0448  BP: 125/68 140/63  Pulse: 82 93  Temp: 97.6 F (36.4 C) 97.8 F (36.6 C)  Resp: 16 16    Dorsiflexion/Plantar flexion intact Incision: dressing C/D/I No cellulitis present Compartment soft  LABS  Recent Labs  01/22/15 0518 01/23/15 0510  HGB 10.7* 9.7*  HCT 31.4* 29.1*  WBC 12.8* 12.7*  PLT 228 192     Recent Labs  01/22/15 0518 01/23/15 0510  NA 131* 131*  K 4.7 4.6  BUN 14 20  CREATININE 0.58 0.54  GLUCOSE 140* 134*     Assessment/Plan: 2 Days Post-Op Procedure(s) (LRB): TOTAL RIGHT KNEE ARTHROPLASTY (Right) Up with therapy Discharge home with home health  Follow up in 2 weeks at [facility]. Follow up with OLIN,Elaine Roanhorse D in 2 weeks.  Contact information:  [facility] [address], [address] [place]   PAC  01/23/2015, 8:48 AM

## 2015-01-23 NOTE — Progress Notes (Signed)
PT TX NOTE  01/23/15 1000  PT Visit Information  Last PT Received On 01/23/15  Assistance Needed +1  History of Present Illness PT is a 81 year old female s/p R TKA  Precautions  Precautions Knee;Fall  Restrictions  Weight Bearing Restrictions No  Other Position/Activity Restrictions WBAT  Pain Assessment  Pain Assessment 0-10  Pain Score 5  Pain Location R knee  Pain Descriptors / Indicators Aching;Sore  Pain Intervention(s) Limited activity within patient's tolerance;Monitored during session;Premedicated before session;Repositioned;Ice applied  Cognition  Arousal/Alertness Awake/alert  Behavior During Therapy WFL for tasks assessed/performed  Overall Cognitive Status Within Functional Limits for tasks assessed  Bed Mobility  Overal bed mobility Needs Assistance  Bed Mobility Supine to Sit  Supine to sit Modified independent (Device/Increase time)  General bed mobility comments no assist from therapist, pt was quick to get oob  Transfers  Overall transfer level Needs assistance  Equipment used Rolling walker (2 wheeled)  Transfers Sit to/from Stand  Sit to Stand Supervision  General transfer comment min VC's for bilat LEs positioning   Ambulation/Gait  Ambulation/Gait assistance Min guard  Ambulation Distance (Feet) 250 Feet  Assistive device Rolling walker (2 wheeled)  Gait Pattern/deviations Step-to pattern;Step-through pattern;Antalgic  General Gait Details min VC's for weight shift into R LE and staying close to the RW. Pt denies dizziness/nausea, reports minimal pain, but rates at 5/10.   Stairs Yes  Stairs assistance Min guard  Stair Management One rail Right;Step to pattern;Forwards (pt uses both UEs on rail, provided handout for reference)  Number of Stairs 4 (practiced twice up/down)  General stair comments min guard for safety, educated pt, husband, and daughter on correct technique   Exercises  Exercises (reviewed all exercises with pt and family)  PT -  End of Session  Equipment Utilized During Treatment Gait belt  Activity Tolerance Patient tolerated treatment well  Patient left in chair;with call bell/phone within reach;with chair alarm set;with family/visitor present  Nurse Communication Mobility status  PT Plan  PT Frequency (ACUTE ONLY) 7X/week  PT Recommendation  Follow Up Recommendations Home health PT  PT equipment Rolling walker with 5" wheels  Acute Rehab PT Goals  Patient Stated Goal get back to being independent  PT Goal Formulation With patient  Time For Goal Achievement 01/29/15  Potential to Achieve Goals Good  PT Time Calculation  PT Start Time (ACUTE ONLY) 1048  PT Stop Time (ACUTE ONLY) 1114  PT Time Calculation (min) (ACUTE ONLY) 26 min  PT General Charges  $$ ACUTE PT VISIT 1 Procedure  PT Treatments  $Gait Training 23-37 mins  Ileana Roup, SPT 01/23/2015

## 2015-01-25 NOTE — Discharge Summary (Signed)
Physician Discharge Summary  Patient ID: Miranda Thompson MRN: [id number] DOB/AGE: 11/01/1933 60 y.o.  Admit date: 01/21/2015 Discharge date: 01/23/2015   Procedures:  Procedure(s) (LRB): TOTAL RIGHT KNEE ARTHROPLASTY (Right)  Attending Physician:  Dr. [name]   Admission Diagnoses:   Right knee primary OA / pain  Discharge Diagnoses:  Principal Problem:   S/P right TKA Active Problems:   S/P knee replacement  Past Medical History  Diagnosis Date  . Allergic rhinitis   . Bronchiectasis with acute exacerbation ([place])   . Hemoptysis   . History of pneumonia   . Hypercholesteremia   . Diverticulosis of colon   . DJD (degenerative joint disease)   . Low back pain syndrome   . Rotator cuff syndrome of right shoulder   . Osteopenia   . Anemia   . Family history of malignant neoplasm of gastrointestinal tract   . Pseudomonas infection   . Breast cancer, left breast ([place]) 1994 with lumpectomy    chemo,tamox  . Fibromyalgia   . Spinal stenosis   . Atrial fibrillation ([place])     goes in and out of this rhythm- takes Tenormin  . Hypertension   . COPD (chronic obstructive pulmonary disease) ([facility])     HPI:    Miranda Thompson, 81 y.o. female, has a history of pain and functional disability in the right knee due to arthritis and has failed non-surgical conservative treatments for greater than 12 weeks to include NSAID's and/or analgesics, corticosteriod injections and activity modification. Onset of symptoms was gradual, starting 30+ years ago with gradually worsening course since that time. The patient noted no past surgery on the right knee(s). Patient currently rates pain in the right knee(s) at 8 out of 10 with activity. Patient has night pain, worsening of pain with activity and weight bearing, pain that interferes with activities of daily living, pain with passive range of motion, crepitus and joint swelling. Patient has evidence of periarticular osteophytes and joint  space narrowing by imaging studies. There is no active infection. Risks, benefits and expectations were discussed with the patient. Risks including but not limited to the risk of anesthesia, blood clots, nerve damage, blood vessel damage, failure of the prosthesis, infection and up to and including death. Patient understand the risks, benefits and expectations and wishes to proceed with surgery.   PCP: Noralee Space, MD   Discharged Condition: good  Hospital Course:  Patient underwent the above stated procedure on 01/21/2015. Patient tolerated the procedure well and brought to the recovery room in good condition and subsequently to the floor.  POD #1 BP: 122/65 ; Pulse: 52 ; Temp: 98.3 F (36.8 C) ; Resp: 16 Patient reports pain as mild. Relatively comfortable over night, just awoken by alarms and staff every 3 hours or so. Dorsiflexion/plantar flexion intact, incision: dressing C/D/I, no cellulitis present and compartment soft.   LABS  Basename    HGB     10.7  HCT     31.4   POD #2  BP: 140/63 ; Pulse: 93 ; Temp: 97.8 F (36.6 C) ; Resp: 16 Patient reports pain as mild, pain controlled. No events throughout the night. Ready to be discharged home. Dorsiflexion/plantar flexion intact, incision: dressing C/D/I, no cellulitis present and compartment soft.   LABS  Basename    HGB     9.7  HCT     29.1    Discharge Exam: General appearance: alert, cooperative and no distress Extremities: Homans sign is negative, no  sign of DVT, no edema, redness or tenderness in the calves or thighs and no ulcers, gangrene or trophic changes  Disposition: Home with follow up in 2 weeks   Follow-up Information    Follow up with Miranda Pole, MD. Schedule an appointment as soon as possible for a visit in 2 weeks.   Specialty:  Orthopedic Surgery   Contact information:   [address] [place] [address] [phone]       Follow up with [facility].   Why:   physical therapy   Contact information:   [address] [place] [address] [phone]       Follow up with [facility].   Why:  rolling walker   Contact information:   [address] [place] [address] [phone]       Discharge Instructions    Call MD / Call 911    Complete by:  As directed   If you experience chest pain or shortness of breath, CALL 911 and be transported to the hospital emergency room.  If you develope a fever above 101 F, pus (white drainage) or increased drainage or redness at the wound, or calf pain, call your surgeon's office.     Change dressing    Complete by:  As directed   Maintain surgical dressing until follow up in the clinic. If the edges start to pull up, may reinforce with tape. If the dressing is no longer working, may remove and cover with gauze and tape, but must keep the area dry and clean.  Call with any questions or concerns.     Constipation Prevention    Complete by:  As directed   Drink plenty of fluids.  Prune juice may be helpful.  You may use a stool softener, such as Colace (over the counter) 100 mg twice a day.  Use MiraLax (over the counter) for constipation as needed.     Diet - low sodium heart healthy    Complete by:  As directed      Discharge instructions    Complete by:  As directed   Maintain surgical dressing until follow up in the clinic. If the edges start to pull up, may reinforce with tape. If the dressing is no longer working, may remove and cover with gauze and tape, but must keep the area dry and clean.  Follow up in 2 weeks at [facility]. Call with any questions or concerns.     Increase activity slowly as tolerated    Complete by:  As directed   Weight bearing as tolerated with assist device (walker, cane, etc) as directed, use it as long as suggested by your surgeon or therapist, typically at least 4-6 weeks.     TED hose    Complete by:  As directed   Use  stockings (TED hose) for 2 weeks on both leg(s).  You may remove them at night for sleeping.             Medication List    STOP taking these medications        acetaminophen 500 MG tablet  Commonly known as:  TYLENOL     aspirin 81 MG tablet  Replaced by:  aspirin 325 MG EC tablet      TAKE these medications        ADVAIR DISKUS 250-50 MCG/DOSE Aepb  Generic drug:  Fluticasone-Salmeterol  INHALE 1 PUFF ONCE A DAY  ALIGN PO  Take by mouth daily.     aspirin 325 MG EC tablet  Take 1 tablet (325 mg total) by mouth 2 (two) times daily.     Calcium Carb-Cholecalciferol 600-800 MG-UNIT Tabs  Take 1 tablet by mouth daily.     cholecalciferol 1000 UNITS tablet  Commonly known as:  VITAMIN D  Take 1,000 Units by mouth daily.     diltiazem 120 MG 24 hr capsule  Commonly known as:  CARDIZEM CD  Take 1 capsule (120 mg total) by mouth daily.     docusate sodium 100 MG capsule  Commonly known as:  COLACE  Take 1 capsule (100 mg total) by mouth 2 (two) times daily.     Estradiol 10 MCG Tabs vaginal tablet  Place 1 tablet (10 mcg total) vaginally 2 (two) times a week.     ferrous sulfate 325 (65 FE) MG tablet  Take 1 tablet (325 mg total) by mouth 3 (three) times daily after meals.     GLUCOSAMINE CHONDR COMPLEX 500-400 MG Caps  Generic drug:  Glucosamine-Chondroitin  Take by mouth daily.     guaiFENesin 600 MG 12 hr tablet  Commonly known as:  MUCINEX  Take 1,200 mg by mouth 2 (two) times daily.     HYDROcodone-acetaminophen 7.5-325 MG tablet  Commonly known as:  NORCO  Take 1-2 tablets by mouth every 4 (four) hours as needed for moderate pain.     losartan 50 MG tablet  Commonly known as:  COZAAR  Take 1 tablet (50 mg total) by mouth daily.     MULTIVITAMIN & MINERAL PO  Take 1 tablet by mouth daily.     polyethylene glycol packet  Commonly known as:  MIRALAX / GLYCOLAX  Take 17 g by mouth 2 (two) times daily.     tiZANidine 4 MG tablet  Commonly known  as:  ZANAFLEX  Take 1 tablet (4 mg total) by mouth every 6 (six) hours as needed for muscle spasms.         Signed: [place] Pugh. Mattheo Swindle   PA-C  01/25/2015, 8:02 AM

## 2015-01-31 ENCOUNTER — Ambulatory Visit: Payer: Medicare Other | Admitting: Certified Nurse Midwife

## 2015-03-15 ENCOUNTER — Encounter: Payer: Self-pay | Admitting: *Deleted

## 2015-03-16 NOTE — Progress Notes (Signed)
Patient ID: Miranda Thompson, female   DOB: 1933/09/10, 82 y.o.   MRN: [id number]   82 y.o. referred by Dr [name] for palpitations. Has bad chronic lung disease with bronchiectasis Has had long standing skips but seem to be worse lately with atrial bigeminy seen on office ECG recently. At one time BP monitor showed pulse of 157 and patient felt a bit dizzy No syncope She does not wear oxygen Echo 2 years ago showed good LV and no pulmonary hypertension. She feels occasional flutterin in chest and senses that her HR is always high in 90's She does have some wheezing No chest pain chronic mild dyspnea  No sputum or fever Labs reviewed and TSH and HCT ok this month No history of MAT or PAF No bleeding diathesis   Started on Atenolol 05/17/13   Eventually complained of low endurance and stopped at daughters request  Cardizem started last visit for palpitations and HTN  BP monitor showed good control  Echo ok with normal EF 3/27  Study Conclusions  - Left ventricle: The cavity size was normal. Systolic function was normal. The estimated ejection fraction was in the range of 55% to 60%. Wall motion was normal; there were no regional wall motion abnormalities. Doppler parameters are consistent with mildly elevated ventricular end-diastolic filling pressure. - Aortic valve: Trileaflet; mildly thickened leaflets. Transvalvular velocity was within the normal range. There was no stenosis. No regurgitation. - Aortic root: The aortic root was normal in size. - Mitral valve: Mildly thickened leaflets . Mild regurgitation. - Right ventricle: The cavity size was mildly dilated. Wall thickness was normal. Systolic function was normal. - Right atrium: The atrium was mildly dilated. - Atrial septum: No defect or patent foramen ovale was identified. - Tricuspid valve: Mild-moderate regurgitation. - Pulmonic valve: Mild regurgitation. - Pulmonary arteries: Systolic pressure was within the normal range. PA  peak pressure: 69mm Hg (S).  No complaints has had flu shot no recent lung infections and no palpitations   ROS: Denies fever, malais, weight loss, blurry vision, decreased visual acuity, cough, sputum, SOB, hemoptysis, pleuritic pain, palpitaitons, heartburn, abdominal pain, melena, lower extremity edema, claudication, or rash.  All other systems reviewed and negative  General: BP 140/68 mmHg  Pulse 86  Ht 5\' 3"  (1.6 m)  Wt 61.961 kg (136 lb 9.6 oz)  BMI 24.20 kg/m2  SpO2 92%  LMP 02/23/1985  Affect appropriate Frail elderly female  HEENT: normal Neck supple with no adenopathy JVP normal no bruits no thyromegaly Lungs rhonchi diffuse no  wheezing and good diaphragmatic motion Heart:  S1/S2 no murmur, no rub, gallop or click PMI normal Abdomen: benighn, BS positve, no tenderness, no AAA no bruit.  No HSM or HJR Distal pulses intact with no bruits No edema Neuro non-focal Skin warm and dry No muscular weakness   Current Outpatient Prescriptions  Medication Sig Dispense Refill  . Calcium Carb-Cholecalciferol 600-800 MG-UNIT TABS Take 1 tablet by mouth daily.    . cholecalciferol (VITAMIN D) 1000 UNITS tablet Take 1,000 Units by mouth daily.    Marland Kitchen diltiazem (CARDIZEM CD) 120 MG 24 hr capsule Take 1 capsule (120 mg total) by mouth daily. 30 capsule 6  . Estradiol 10 MCG TABS vaginal tablet Place 1 tablet (10 mcg total) vaginally 2 (two) times a week. 24 tablet 4  . ferrous sulfate 325 (65 FE) MG tablet Take 1 tablet (325 mg total) by mouth 3 (three) times daily after meals.  3  . Fluticasone-Salmeterol (ADVAIR) 250-50  MCG/DOSE AEPB Inhale 1 puff into the lungs daily.    . Glucosamine-Chondroitin (GLUCOSAMINE CHONDR COMPLEX) 500-400 MG CAPS Take 1 tablet by mouth daily.     Marland Kitchen guaiFENesin (MUCINEX) 600 MG 12 hr tablet Take 1,200 mg by mouth 2 (two) times daily.    Marland Kitchen losartan (COZAAR) 50 MG tablet Take 1 tablet (50 mg total) by mouth daily. 30 tablet 5  . Multiple Vitamins-Minerals  (MULTIVITAMIN & MINERAL PO) Take 1 tablet by mouth daily.      . Probiotic Product (ALIGN PO) Take 1 tablet by mouth daily.     Marland Kitchen tiZANidine (ZANAFLEX) 4 MG tablet Take 1 tablet (4 mg total) by mouth every 6 (six) hours as needed for muscle spasms. 40 tablet 0   No current facility-administered medications for this visit.    Allergies  Review of patient's allergies indicates no known allergies.  Electrocardiogram:  04/30/13  SR atrial bigeminy normal ST segments   10/2014 SR rate 76  LAE  Normal  03/18/15 SR rate 87 LAE otherwise normal   Assessment and Plan Arrhythmia:  Related to lung disease Atenolol stopped  Stable  Bronchiectasis:  F/u [name] lung exam always abnormal no wheezing activity level good Vasc:  Taking 81 mg aspirin for prevention HTN:  Steroid injection in knees should be out of system.  Thinks beta blocker made her more tired  Add Cardizem 240 mg F/u next available

## 2015-03-18 ENCOUNTER — Encounter: Payer: Self-pay | Admitting: Cardiovascular Disease

## 2015-03-18 ENCOUNTER — Ambulatory Visit (INDEPENDENT_AMBULATORY_CARE_PROVIDER_SITE_OTHER): Payer: Medicare Other | Admitting: Cardiovascular Disease

## 2015-03-18 VITALS — BP 140/68 | HR 86 | Ht 63.0 in | Wt 136.6 lb

## 2015-03-18 DIAGNOSIS — I10 Essential (primary) hypertension: Secondary | ICD-10-CM | POA: Diagnosis not present

## 2015-03-18 DIAGNOSIS — R002 Palpitations: Secondary | ICD-10-CM | POA: Diagnosis not present

## 2015-03-18 NOTE — Patient Instructions (Signed)

## 2015-03-20 ENCOUNTER — Encounter: Payer: Self-pay | Admitting: Pulmonary Disease

## 2015-03-20 ENCOUNTER — Ambulatory Visit (INDEPENDENT_AMBULATORY_CARE_PROVIDER_SITE_OTHER): Payer: Medicare Other | Admitting: Pulmonary Disease

## 2015-03-20 VITALS — BP 142/62 | HR 94 | Temp 97.5°F | Ht 63.0 in | Wt 134.6 lb

## 2015-03-20 DIAGNOSIS — J479 Bronchiectasis, uncomplicated: Secondary | ICD-10-CM

## 2015-03-20 DIAGNOSIS — I491 Atrial premature depolarization: Secondary | ICD-10-CM | POA: Diagnosis not present

## 2015-03-20 DIAGNOSIS — I10 Essential (primary) hypertension: Secondary | ICD-10-CM

## 2015-03-20 DIAGNOSIS — D649 Anemia, unspecified: Secondary | ICD-10-CM

## 2015-03-20 DIAGNOSIS — M174 Other bilateral secondary osteoarthritis of knee: Secondary | ICD-10-CM | POA: Diagnosis not present

## 2015-03-20 DIAGNOSIS — Z96651 Presence of right artificial knee joint: Secondary | ICD-10-CM

## 2015-03-20 NOTE — Patient Instructions (Signed)
Today we updated your med list in our EPIC system...    Continue your current medications the same...  Keep up the good work w/ your exercise program...  Call for any questions...  Let's plan a follow up visit in 3-4 months w/ FASTING blood work at that time.Marland KitchenMarland Kitchen

## 2015-03-20 NOTE — Progress Notes (Signed)
Patient ID: Miranda Thompson, female   DOB: 08-Sep-1933, 82 y.o.   MRN: [id number]  Subjective:    Patient ID: Miranda Thompson, female    DOB: 22-Feb-1934, 82 y.o.   MRN: [id number]  HPI 82 y/o WF here for a follow up visit... she has mult med problems as noted below...   ~  SEE PREV EPIC NOTES FOR OLDER DATA >>    LABS 3/14:  FLP- at goals on diet alone;  Chems- wnl;  CBC- wnl;  TSH=2.37;  VitD=31;  UA- clear...  SPUTUM 4/14 >> Pseudomonas sensitive to all antibiotics   LABS 9/14:  CBC- ok w/ Hg=13.7, WBC=6.4;  Fe=91 (25%sat)... BMet wasn't done...   CXR 3/15 showed stable film w/ scarring LUL, no change and surg clips left breast & axilla  EKG 3/15 showed NSR w/ PACs and atrial bigem, 1st degree AVB, otherw NAD...   2DEcho 3/15 showed norm LV size & function w/ EF=55-60%, norm wall motion, valves ok w/ sl thickening of leaflets- mildMR, modTR,; mild RV dil but norm RVF & PAsys=31  LABS 3/15:  FLP- ok on diet alone;  Chems- wnl;  CBC- wnl;  Fe=69 (19%sat);  VitD=49;  TSH=4.33... UA is clear, wnl...   ~  November 08, 2013:  34moROV & Miranda Thompson had an exac 6/15- seen by TP, CXR stable (NAD), treated w/ Levaquin/ Mucinex & improved, but she stopped her Advair & now notes sl incr SOB;  PFT w/ FEV1=1.22 (61%) & she is rec to restart her inhaler;  Notes chr cough w/ green-gray phlegm from her severe bronchiectasis (last pos sputum was 4/14 +Pseudomonas- pansensitive);  EXAM shows bilat rhonchi, no consolidation, and she is stable on her regular regimen and vigorous chest physiotherapy regimen to aide sputum production...     She had f/u DrNishan 4/15> hx palpit, bigeminy on EKG, improved on Aten25; no changes made...    She continues to follow up w/ DrFields> right knee pain, wears brace & gets shots periodically, his notes are reviewed...  We reviewed prob list, meds, xrays and labs> see below for updates >> OK 2015 Flu vaccine today...   PFT 9/15 showed FVC= 2.08 (77%), FEV1= 1.22 (61%), %1sec=  59%, mid-flows= 31% predicted... c/w GOLD Stage2 COPD, pt GroupB...  Ambulatory O2 Test>  O2sat on RA at rest= 96% w/ pulse=73/min;  Ambulated 3 laps w/ lowest O2sat= 96% w/ pulse=94/min.... PLAN>> continue same Advair250Bid, Mucinex 1200Bid, Fluids, vigorous home chest PT & postural drainage, stay as active as poss...  ~  May 09, 2014:  631moOV & Miranda Thompson reports a good interval- no intercurrent infections, breathing is about the same but she notes some sl incr DOE assoc w/ decr exercise due to knee arthritis; she sees drFields and as had several shots but wonders about TKR- given the names for DrAlusio & DrOlin as contacts... We reviewed the following medical problems during today's office visit >>     Bronchiectasis & HxPneumonia> on Advair250Bid (not taking regularly) & GFN 1200Bid; PFT 9/15 w/ GOLD stage2-B COPD; mod cough, grey colored sput, intermit hemoptysis, denies f/c/s; last pos culture was 4/14= Pseudomonas, pan-sens & treated w/ Cipro at that time; she does regular ChestPT...    Palpitations & PACs> she had PACs & atrial bigeminy; improved on Atenolol25; [facility]- no AFib or MAT & no changes made...    CHOL> on diet alone; FLP 3/15 showed TChol 172, TG 50, HDL 56, LDL 106  GI- Divertics> asked to take Align, Metamucil, etc; she had diverticulitis flair 2012 x2 & treated by DrPatterson w/ Cipro & Flagyl    GU- HxUTI w/ pelvic pressure, back pain, freq&urgency> felt to have a chronic cystitis by DrEskridge & she understands asymptomatic bactiuria...    Hx Breast Cancer> she had surg 1994 w/ lumpectomy & LN dissection followed by XRT & Chemorx; she was [facility] by [facility] in [facility]; followed by [place]- seen 12/15 w/ neg exam & mammogram 3/16- OK...    DJD, LBP, Osteopenia> on Tylenol, Estradiol topically per Gyn, calcium, MVI, VitD1000; she walks >64m/d for exercise; followed by DrFields regularly; c/o incr right knee pain & she requested Ortho names  for poss TKR (Alusio/ OAlvan Dame; [facility] DrRomaine=> now seeing DrSMiller...    HxEczema> she uses CoQ10,Borage Oil & black current; she likes herbal remedies...  We reviewed prob list, meds, xrays and labs> see below for updates >>   CXR 3/16 is stable- chronic changes on left, clips from prev left breast surg, DDD in Tspine...  LABS 3/16:  FLP- wnl on diet alone;  Chems- wnl;  CBC- wnl;  TSH=4.99...  ~  November 06, 2014:  688moOV & medical f/u visit>     From the pulmonary standpoint she is stable w/ her severe bronchiectasis but only using her Advair250 once daily & only taking the Mucinex 60038md; asked to incr the Advair250Bid & the Mucinex600-2Bid w/ fluids and maintain a vigorous chest PT & postural drainage routine to clear her airways...    She had f/u CAR[facility]- DrNishan 10/25/14> hx palpit w/ atrial bigeminy on office EKG; prev tried Aten but stopped due to low endurance, 2DEcho 04/2013 showed norm EF=55-60%, no wall motion abn, mildMR, PAsys=31; he added CARDIZEM-CD 240m18mbut pt states that she didn't take it stating that she "only wants low-dose meds" => therefore rec to try CARDIZEN-CD 120mg48m.    She also notes that her BP is "all over the place" but BP monitor was apparently ok; she read her EKG on my chart & had a lot of questions; currently not taking any BP med & BP today= 140/80; I told her that the Cardizem would help regulate her blood pressure as well as help control her palpitations...     She had a routine f/u colonoscopy 08/08/14 by DrPyrtle> +FamHx colon cancer in father; 2 sessile polyps removed, 3-5mm s63m, mod diverticulosis, Path= tubular adenomas...    She saw DrFields for her knees w/ grade4 arthritis on right, cortisone shots lasted several months, uses compression sleeve & Aleve/ Tramadol; he referred her to DrOlinBrunsonotes "bone on bone" & rec bilat TKRs...  EXAM shows Afeb, VSS, O2sat=96% on RA;  HEENT- neg, mallampati1;  Chest- diffuse  bilat rhonchi/ congestion, w/o wheezing or signs of consolidation;  Heart- RR gr1/6 SEM w/o r/g;  Abd- soft, neg;  Ext- w/o c/c/e...  EKG 06/19/14 showed NSR, rate78, poor R progression V1-3, otherw neg/NAD...  We reviewed prev CXR, LABS, etc... IMP/PLAN>>  She reminded me that her daughter didn't want her on Atenolol, & she only wants "low-dose" meds therefore try CARDIZEM-CD 120mg/d62meeds to incr the Advair250 to Bid & the Mucinex 600mg to47md w/ fluids + continue the postural drainage/ chest PT regimen... OK 2016 flu vaccine today.  ~  December 18, 2014:  6wk ROV & recheck> Allysson Caitlann taking the CardizemCD120 since last OV & watching her home BP checks very  carefully; pressure readings are somewhat labile- 110/70 range to 160/100+ range using her home cuff; she remains asymptomatic w/o HA, CP, palpit, dizzy, edema, etc; exercise is limited by her knees and she tells me she is ready for TKR surg by DrOlin; we decided to add LOSARTAN554m/d for her BP control & she will continue to monitor it at home... EXAM shows Afeb, VSS, O2sat=96% on RA;  BP=132/74; HEENT- neg, mallampati1;  Chest- diffuse bilat rhonchi w/o wheezing or signs of consolidation;  Heart- RR gr1/6 SEM w/o r/g;  Abd- soft, neg;  Ext- w/o c/c/e... PLAN>>  She will continue all current meds and ADD LOSARTAN 565md; watch BPs at home & call for questions; she asks that I send this note to DrBallor surg from the medical standpoint...  ~  March 20, 2015:  54m19moV & Twanda had her right TKR by DrOlin 01/21/15, s/p PT, still sl stiff but exercising on bike & walking some- no complications and she is very pleased;  BP is improved w/ the addition of Losartan50 & measures 142/62 today... We reviewed the following medical problems during today's office visit >>     Bronchiectasis & HxPneumonia> on Advair250Bid (not taking regularly) & GFN 1200Bid; PFT 9/15 w/ GOLD stage2-B COPD; mod cough, grey colored sput, intermit hemoptysis,  denies f/c/s; last pos culture was 4/14= Pseudomonas, pan-sens & treated w/ Cipro at that time; she does regular ChestPT which really helps...    Palpitations & PACs> she had PACs & atrial bigeminy; improved on Atenolol25; followed by DrNCherly Hensenchanged to CardizemCD120 (she refused 240 dose), last seen 03/18/15- no AFib or MAT & no changes made...    CHOL> on diet alone; FLP 3/16 showed TChol 163, TG 70, HDL 53, LDL 96    GI- Divertics> asked to take Align, Metamucil, etc; she had diverticulitis flair in 2012 x2 & treated by DrPatterson w/ Cipro & Flagyl, ok since then.    GU- HxUTI w/ pelvic pressure, back pain, freq&urgency> felt to have a chronic cystitis by DrEskridge & she understands asymptomatic bactiuria...    Hx Breast Cancer> she had surg 1994 w/ lumpectomy & LN dissection followed by XRT & Chemorx; [facility]; followed by GynPlainseen 12/15 w/ neg exam & mammogram 3/16- OK...    DJD, LBP, Osteopenia> on Tylenol, Estradiol topically per Gyn, calcium, MVI, VitD1000; she walks >1mi61mfor exercise; followed by DrFields & OlinAlvan Damep right TKR 12/2014; [facility]...     HxEczema> she uses CoQ10,Borage Oil & black current; she likes herbal remedies...  EXAM shows Afeb, VSS, O2sat=96% on RA;  HEENT- neg, mallampati1;  Chest- rhonchi L>R, w/o w/r/consolidation;  Heart- RR w/o m/r/g;  Abd- soft, nontender, neg;  Ext- s/p R-TKR, w/o c/c/e;  Neuro- intact...  LABS 03/20/15> we discussed FASTING labs on return... IMP/PLAN>>  Mult medical problems as listed- MariLuastable & did well w/ the knee replacement surg; continue same meds and her vigorous chest PT regimen; we plan recheck in 3-59mo 52moasting blood work.           Problem List:     ALLERGIC RHINITIS (ICD-477.9) - she uses OTC antihistamines Prn.  BRONCHIECTASIS (ICD-494.0) & Hx of PNEUMONIA (ICD-486) - on ADVAIR 250 Bid, & MUCINEX 1-2Bid...  Long hx severe  bronchiectasis, granulomatous lung dis, and pneumonia (initially Dx 1968 by DrFrazier, subseq bx= noncaseating gran dz & chr inflam)... her  last bronchoscopy was 4/04 revealing some mucus plugging... CTChest 4/04 showed marked bronchiectasis in lingular, LLL, RML, and mucus plugging... CT repeated 6/06- similar. ~  Sputum cultures 5/08 grew Serratia (R to amox/ceph, S to all others), branching part acid fast bacilli resembling nocardia, yeast c/w candida... ~  CXR 1/09 chr lung dis, no change from prev w/ left mid lung zone as the worst area... ~  CXR 3/10 w/ chr lung changes and NAD suspected... ~  Sputum 8/10 showed NTF only & AFB smears & cult all neg... ~  CXR 3/11 showed chr lung dis, no acute changes, stable... ~  CXR 3/12 showed chronic changes, scarring, NAD.Marland Kitchen. ~  CXR 3/13 showed essentially no change> normal heart size, prom pulm arts, bilat interstitial prominence & nodularity, bronchiectasis, NAD, surg clips from prev left breast ca surg. ~  9/13: she presents w/ an acute exac w/ cough, thick sput, congestion, fever, SOB, & chest discomfort; CXR shows worse left mid zone opacity; We decided to treat w/ empiric Levaquin x10d pending cult report==> ret w/ resist Pseudomonas, but pt states better/ improved on the Levaquin. ~  Sputum C&S 4/14 showed Pseudomonas sens to Cefepime, Tressie Ellis, Cipro, Levaquin, etc ~  3/15 & 9/15: on Advair250Bid (not taking regularly) & GFN 1200Bid; mod cough, grey colored sput, intermit hemoptysis, denies f/c/s; last pos culture was 4/14= Pseudomonas, pan-sens & treated w/ Cipro. ~  CXR 3/15 showed stable film w/ scarring LUL, no change and surg clips left breast & axilla... ~  PFTs 9/15 showed FVC= 2.08 (77%), FEV1= 1.22 (61%), %1sec= 59%, mid-flows= 31% predicted... c/w GOLD Stage2 COPD, pt GroupB... ~  3/16: on Advair250Bid & GFN 1200Bid (asked to take regularly); PFT 9/15 w/ GOLD stage2-B COPD; mod cough, grey colored sput, intermit hemoptysis, denies f/c/s; last  pos culture was 4/14= Pseudomonas, pan-sens & treated w/ Cipro at that time; she does regular ChestPT... ~  CXR 3/16 is stable- chronic changes on left, clips from prev left breast surg, DDD in Tspine ~  9/16: she is reminded to use the Advair250Bid & the Mucinex600-2Bid w/ fluids and maintain a vigorous chest PT & postural drainage regimen...  PALPITATIONS >> followed by Barton Fanny on Atenolol25 & improved... ~  2DEcho 3/15 showed norm LV size & function w/ EF=55-60% and no regional wall motion abn, mild MR, mild TR, PAsys=74mHg... ~  9/16: she reminds me that daughter does not want her on Atenolol; DrNishan ordered CardizemCD240 but she "only wants low dose meds"; therefore try CardizemCD120/d... ~  10/16: pt returns w/ extensive BP records on her CardizemCD120/d; BP today= 132/74 but BP at home up to 160/100+ and we decided to add LOSATAN 544md to her regimen...   HYPERCHOLESTEROLEMIA, BORDERLINE (ICD-272.4) - on diet alone... ~  FLP 9/10 showed TChol 174, TG 64, HDL 49, LDL 112 ~  FLP 9/11 showed TChol 175, TG 61, HDL 48, LDL 115 ~  FLP 3/13 showed TChol 176, TG 69, HDL 64, LDL 98 ~  FLP 9/13 showed TChol 169, TG 65, HDL 48, LDL 108 ~  FLP 3/14 showed TChol 157, TG 54, HDL 46, LDL 100 ~  FLP 3/15 showed TChol 172, TG 50, HDL 56, LDL 106 ~  FLP 3/16 showed TChol 163, TG 70, HDL 53, LDL 96  Hx of ADENOCARCINOMA, BREAST (ICD-174.9) - [facility] lesion and LN dissection (stage II 1.0  cm tumor), w/ post op XRT and chemotherapy... she was followed by DrWhitney Post  for oncology... then DrLivesay (released in 2003). ~  routine Mammogram 2/10 was neg- f/u planned 40yr ~  f/u mammogram 2/11 = neg, NAD, f/u planned 168yr~  f/u mammogram 2/12 = neg, NAD, f/u 1y36yr  f/u mammogram 2/13 = neg, NAD... Marland Kitchen  f/u mammogram 2/14 = neg, scat parenchymal densities... ~  [facility]... Marland Kitchen  [facility]  [facility]....  DIVERTICULOSIS OF COLON (ICD-562.10) - colonoscopy 3/05 by DrPatterson showed divertics only... she has +fam hx colon ca w/ colon check q5yr60yr f/u colonoscopy 5/11 showed +divertics, no polyps... ~  3/12:  Hx, exam & CTAbd c/w diverticulitis; treated w/ Cipro/ Flagyl & resolved;  Advised Metamucil, Miralax, etc. ~  11/12:  She had f/u DrPatterson after another bout of diverticulitis; he notes regular BMs, good appetite, stable weight; he wanted her to STOP Naprosyn, ok to use Tylenol/ Tramadol, rec hi fiber, saw the [place]. ~  She had a routine f/u colonoscopy 08/08/14 by DrPyrtle> +FamHx colon cancer in father; 2 sessile polyps removed, 3-5mm 37me, mod diverticulosis, Path= tubular adenomas...  DEGENERATIVE JOINT DISEASE (ICD-715.90) - see Ortho eval 2010 by DrDaldorf... she takes Glucosamine & Herbal supplements- milk thistle/ tumeric which she says really helps... ~  4/14: she saw DrKarlFields for SportsMed> c/o bilat knee pain, known arthritis, uses body helix knee (compression) sleeves & Tramadol prn; takes tart red cherry oil daily & he rec devil's claw, tumeric, & curcumin... ~  3/15: she continues to f/u w/ drFields for knee shots periodically... ~  3/16: she is requesting name of Ortho for poss Robinette  She is managed by DrFields and DrOlin => told she needs TKRs & we will send medical clearance...  LOW BACK PAIN SYNDROME (ICD-724.2) - [facility] showed herniated disc L3-4 w/ mod spinal stenosis and lat recess stenosis bilat...   ROTATOR CUFF SYNDROME, RIGHT (ICD-726.10) - [facility] w/ [facility].  OSTEOPENIA (ICD-733.90) - [facility], norm hip, osteopenic forearm (-2.0)... followed by GYN = DrRomaine who is treating a low Vit D level... she takes calcium, Vitamins, & Vit D 1000u daily from DrRomAshby  labs 9/10 on 50K weekly showed Vit D level = 42... rec>  change to 1000 u daily. ~  labs 9/11 on 1000 u daily showed Vit D level = 34... rec> continue daily supplement. ~  Labs 3/13 showed Vit D level = 35... Continue supplements. ~  Labs 3/14 showed Vit D level = 31 ~  Labs 3/15 showed Vit D level = 49... Continue supplements  Hx of ANEMIA (ICD-285.9) -  resolved... ~  labs 9/10 showed Hg= 13.6 ~  labs 9/11 showed Hg= 13.2 ~  Labs 3/13 showed Hg= 13.4 ~  Labs 3/14 showed Hg= 13.3 ~  Labs 3/15 showed Hg= 13.4 ~  Labs 3/16 showed Hg= 13.1  ECZEMA - she has hx of eczema on her hands and has used CoQ10 and Borage Oil as rec by an herbaAdministratorenn.Trailecently changed to [facility] instead of these other supplements & rash is much improved...   Past Surgical History  Procedure Laterality Date  . Lung biopsy  1968    TSurg---Dr. FraziMare Ferrarieft breast lumpectomy and ln dissection  06/1992    Dr. NewmaLucia Gaskinsonsillectomy    . Colonoscopy    . Lung biopsy  2012  dr [name]  . Total knee arthroplasty Right 01/21/2015    Procedure: TOTAL RIGHT KNEE ARTHROPLASTY;  Surgeon: [name], MD;  Location: [facility];  Service: Orthopedics;  Laterality: Right;    Outpatient Encounter Prescriptions as of 03/20/2015  Medication Sig  . Calcium Carb-Cholecalciferol 600-800 MG-UNIT TABS Take 1 tablet by mouth daily.  . cholecalciferol (VITAMIN D) 1000 UNITS tablet Take 1,000 Units by mouth daily.  Marland Kitchen diltiazem (CARDIZEM CD) 120 MG 24 hr capsule Take 1 capsule (120 mg total) by mouth daily.  . Estradiol 10 MCG TABS vaginal tablet Place 1 tablet (10 mcg total) vaginally 2 (two) times a week.  . ferrous sulfate 325 (65 FE) MG tablet Take 1 tablet (325 mg total) by mouth 3 (three) times daily after meals.  . Fluticasone-Salmeterol (ADVAIR) 250-50 MCG/DOSE AEPB Inhale 1 puff into the lungs daily.  . Glucosamine-Chondroitin (GLUCOSAMINE CHONDR COMPLEX) 500-400 MG CAPS Take 1 tablet by mouth daily.   Marland Kitchen guaiFENesin (MUCINEX) 600 MG 12 hr tablet Take 1,200 mg by  mouth 2 (two) times daily.  Marland Kitchen losartan (COZAAR) 50 MG tablet Take 1 tablet (50 mg total) by mouth daily.  . Multiple Vitamins-Minerals (MULTIVITAMIN & MINERAL PO) Take 1 tablet by mouth daily.    . Probiotic Product (ALIGN PO) Take 1 tablet by mouth daily.   . [DISCONTINUED] tiZANidine (ZANAFLEX) 4 MG tablet Take 1 tablet (4 mg total) by mouth every 6 (six) hours as needed for muscle spasms.   No facility-administered encounter medications on file as of 03/20/2015.    No Known Allergies   Current Medications, Allergies, Past Medical History, Past Surgical History, Family History, and Social History were reviewed in [employer] record.     Review of Systems         See HPI - all other systems neg except as noted...  The patient complains of dyspnea on exertion and abdominal pain.  The patient denies anorexia, fever, weight loss, weight gain, vision loss, decreased hearing, hoarseness, chest pain, syncope, peripheral edema, prolonged cough, headaches, hemoptysis, melena, hematochezia, severe indigestion/heartburn, hematuria, incontinence, muscle weakness, suspicious skin lesions, transient blindness, difficulty walking, depression, unusual weight change, abnormal bleeding, enlarged lymph nodes, and angioedema.     Objective:   Physical Exam     WD, WN, 82 y/o WF in NAD... GENERAL:  Alert & oriented; pleasant & cooperative... HEENT:  [place]/AT, EOM-wnl, PERRLA, EACs-clear, TMs-wnl, NOSE-clear, THROAT-clear & wnl. NECK:  Supple w/ fairROM; no JVD; normal carotid impulses w/o bruits; no thyromegaly or nodules palpated; no lymphadenopathy. CHEST:  scat bilat rhonchi without wheezing, rales or signs of consolidation... HEART:  Regular Rhythm; without murmurs/ rubs/ or gallops heard... ABDOMEN:  Soft w/ mild tender LLQ; normal bowel sounds; no organomegaly or masses detected. EXT: without deformities or arthritic changes; no varicose veins/ venous insuffic/ or edema. NEURO:   CN's intact;  no focal neuro deficits... DERM:  No lesions noted; no rash etc...  RADIOLOGY DATA:  Reviewed in the EPIC EMR & discussed w/ the patient...  LABORATORY DATA:  Reviewed in the EPIC EMR & discussed w/ the patient...    Assessment & Plan:    COPD/ Bronchiectasis/ Hx pneumonia>  On Advair250, Mucinex2Bid, Fluids & vigorous Chest PT/ postural drainage... 9/13> treating acute exac w/ Levaquin x10d; Cult grew a resist Pseudomonas but she reports clinically improved, therefore continue present Rx & follow...  Repeat Sput C&S 4/14 showed pansens Pseudomonas... Baseline CXRs showed CXR 3/15 & 3/16 showed scarring LUL, no change,  and surg clips left breast & axilla... PFTs 9/15 showed FVC= 2.08 (77%), FEV1= 1.22 (61%), %1sec= 59%, mid-flows= 31% predicted... c/w GOLD Stage2 COPD, pt GroupB... REMINDED TO TAKE MEDS REGULARLY & MAINTAIN A VIGOROUS REGIMEN OF CHEST PT & POSTURAL DRAINAGE...   HBP & palpit (PACs)>  followed by Cherly Hensen; on CardizemCD120 & she does not want BBlockers, & only wants "low-dose" meds; BP at home variable w/ 160/100+ episodes so we decided to add low dose LOSARTAN 69m/d...  CHOL>  On diet alone & f/u FLP is at goal...  Breast Cancer>  [facility] oncology f/u by DrNeijstrom, then [facility] 2003 & released; yearly mammograms & monthly self exams were wnl...  Divertics>  She had acute diverticulitis 3/12 & 10/12> resolved w/ diet adjust & Cipro/Flagyl, now back to baseline & advised re- Metamucil, Fiberall, etc; she saw DrPatterson 11/12.  DJD/ LBP>  Ortho is DrOlin & DrFields; she takes Glucosamine & Herbs; s/p right TKR 12/2014 & doing satis...  Osteopenia/ Vit D defic>  On Calcium, MVI, Vit D supplement; followed by GYN= DrRomaine...  Other medical issues as noted...    Patient's Medications  New Prescriptions   No medications on file  Previous Medications   CALCIUM CARB-CHOLECALCIFEROL 600-800 MG-UNIT TABS    Take 1 tablet by mouth  daily.   CHOLECALCIFEROL (VITAMIN D) 1000 UNITS TABLET    Take 1,000 Units by mouth daily.   DILTIAZEM (CARDIZEM CD) 120 MG 24 HR CAPSULE    Take 1 capsule (120 mg total) by mouth daily.   ESTRADIOL 10 MCG TABS VAGINAL TABLET    Place 1 tablet (10 mcg total) vaginally 2 (two) times a week.   FERROUS SULFATE 325 (65 FE) MG TABLET    Take 1 tablet (325 mg total) by mouth 3 (three) times daily after meals.   FLUTICASONE-SALMETEROL (ADVAIR) 250-50 MCG/DOSE AEPB    Inhale 1 puff into the lungs daily.   GLUCOSAMINE-CHONDROITIN (GLUCOSAMINE CHONDR COMPLEX) 500-400 MG CAPS    Take 1 tablet by mouth daily.    GUAIFENESIN (MUCINEX) 600 MG 12 HR TABLET    Take 1,200 mg by mouth 2 (two) times daily.   LOSARTAN (COZAAR) 50 MG TABLET    Take 1 tablet (50 mg total) by mouth daily.   MULTIPLE VITAMINS-MINERALS (MULTIVITAMIN & MINERAL PO)    Take 1 tablet by mouth daily.     PROBIOTIC PRODUCT (ALIGN PO)    Take 1 tablet by mouth daily.   Modified Medications   No medications on file  Discontinued Medications   TIZANIDINE (ZANAFLEX) 4 MG TABLET    Take 1 tablet (4 mg total) by mouth every 6 (six) hours as needed for muscle spasms.

## 2015-04-09 ENCOUNTER — Ambulatory Visit (INDEPENDENT_AMBULATORY_CARE_PROVIDER_SITE_OTHER): Payer: Medicare Other | Admitting: Certified Nurse Midwife

## 2015-04-09 ENCOUNTER — Encounter: Payer: Self-pay | Admitting: Certified Nurse Midwife

## 2015-04-09 VITALS — BP 120/62 | HR 70 | Resp 16 | Ht 63.25 in | Wt 133.0 lb

## 2015-04-09 DIAGNOSIS — Z01419 Encounter for gynecological examination (general) (routine) without abnormal findings: Secondary | ICD-10-CM

## 2015-04-09 NOTE — Progress Notes (Signed)
82 y.o. [id number] Married  Caucasian Fe here for annual exam. Menopausal no HRT. Denies vaginal dryness or vaginal bleeding. Right knee replacement in 2016, doing well with now. Sees PCP for aex/labs, hypertension management. Sees Pulmonary for asthma management. No changes in medication. Was diagnosed with anemia, on oral iron now. Spouse doing well. Both staying active with family and exercise. No health issues.  Patient's last menstrual period was 02/23/1985.          Sexually active: No.  The current method of family planning is vasectomy.    Exercising: Yes.    stationary bike, floor exercise, weights Smoker:  no  Health Maintenance: Pap: 11-09-08 neg MMG:  04-25-14 category b density,birads 2:neg Colonoscopy:  2016 neg per patient BMD:   2011 pcp manages TDaP:  3/16 Shingles: 2015 Pneumonia: 2016 Hep C and HIV: not done Labs: pcp Self breast exam: not occ   reports that she quit smoking about 48 years ago. Her smoking use included Cigarettes. She has a 10 pack-year smoking history. She has never used smokeless tobacco. She reports that she drinks about 0.6 oz of alcohol per week. She reports that she does not use illicit drugs.  Past Medical History  Diagnosis Date  . Allergic rhinitis   . Bronchiectasis with acute exacerbation ([place])   . Hemoptysis   . History of pneumonia   . Hypercholesteremia   . Diverticulosis of colon   . DJD (degenerative joint disease)   . Low back pain syndrome   . Rotator cuff syndrome of right shoulder   . Osteopenia   . Anemia   . Family history of malignant neoplasm of gastrointestinal tract   . Pseudomonas infection   . Breast cancer, left breast ([place]) 1994 with lumpectomy    chemo,tamox  . Fibromyalgia   . Spinal stenosis   . Atrial fibrillation ([place])     goes in and out of this rhythm- takes Tenormin  . Hypertension   . COPD (chronic obstructive pulmonary disease) [facility])     Past Surgical History  Procedure Laterality Date  . Lung  biopsy  1968    TSurg---Dr. [name]  . Left breast lumpectomy and ln dissection  06/1992    Dr. [name]  . Tonsillectomy    . Colonoscopy    . Lung biopsy  2012    dr [name]  . Total knee arthroplasty Right 01/21/2015    Procedure: TOTAL RIGHT KNEE ARTHROPLASTY;  Surgeon: [name], MD;  Location: [facility];  Service: Orthopedics;  Laterality: Right;    Current Outpatient Prescriptions  Medication Sig Dispense Refill  . Calcium Carb-Cholecalciferol 600-800 MG-UNIT TABS Take 1 tablet by mouth daily.    . cholecalciferol (VITAMIN D) 1000 UNITS tablet Take 1,000 Units by mouth daily.    Marland Kitchen diltiazem (CARDIZEM CD) 120 MG 24 hr capsule Take 1 capsule (120 mg total) by mouth daily. 30 capsule 6  . Estradiol 10 MCG TABS vaginal tablet Place 1 tablet (10 mcg total) vaginally 2 (two) times a week. 24 tablet 4  . ferrous sulfate 325 (65 FE) MG tablet Take 1 tablet (325 mg total) by mouth 3 (three) times daily after meals.  3  . Fluticasone-Salmeterol (ADVAIR) 250-50 MCG/DOSE AEPB Inhale 1 puff into the lungs daily.    . Glucosamine-Chondroitin (GLUCOSAMINE CHONDR COMPLEX) 500-400 MG CAPS Take 1 tablet by mouth daily.     Marland Kitchen guaiFENesin (MUCINEX) 600 MG 12 hr tablet Take 1,200 mg by mouth 2 (two) times daily.    Marland Kitchen  losartan (COZAAR) 50 MG tablet Take 1 tablet (50 mg total) by mouth daily. 30 tablet 5  . Multiple Vitamins-Minerals (MULTIVITAMIN & MINERAL PO) Take 1 tablet by mouth daily.      . Probiotic Product (ALIGN PO) Take 1 tablet by mouth daily.      No current facility-administered medications for this visit.    Family History  Problem Relation Age of Onset  . Heart attack Mother     Deceased age 70  . Colon cancer Father     deceased age 12 from colon ca.  . Hypertension Brother   . Esophageal cancer Neg Hx   . Stomach cancer Neg Hx   . Rectal cancer Neg Hx     ROS:  Pertinent items are noted in HPI.  Otherwise, a comprehensive ROS was negative.  Exam:   BP 120/62 mmHg  Pulse 70   Resp 16  Ht 5' 3.25" (1.607 m)  Wt 133 lb (60.328 kg)  BMI 23.36 kg/m2  LMP 02/23/1985 Height: 5' 3.25" (160.7 cm) Ht Readings from Last 3 Encounters:  04/09/15 5' 3.25" (1.607 m)  03/20/15 5\' 3"  (1.6 m)  03/18/15 5\' 3"  (1.6 m)    General appearance: alert, cooperative and appears stated age Head: Normocephalic, without obvious abnormality, atraumatic Neck: no adenopathy, supple, symmetrical, trachea midline and thyroid normal to inspection and palpation Lungs: clear to auscultation bilaterally Breasts: normal appearance, no masses or tenderness, No nipple retraction or dimpling, No nipple discharge or bleeding, No axillary or supraclavicular adenopathy Heart: regular rate and rhythm Abdomen: soft, non-tender; no masses,  no organomegaly Extremities: extremities normal, atraumatic, no cyanosis or edema Skin: Skin color, texture, turgor normal. No rashes or lesions Lymph nodes: Cervical, supraclavicular, and axillary nodes normal. No abnormal inguinal nodes palpated Neurologic: Grossly normal   Pelvic: External genitalia:  no lesions              Urethra:  normal appearing urethra with no masses, tenderness or lesions              Bartholin's and Skene's: normal                 Vagina: normal appearing vagina with normal color and discharge, no lesions              Cervix: normal,non tender, no lesions              Pap taken: No. Bimanual Exam:  Uterus:  normal size, contour, position, consistency, mobility, non-tender              Adnexa: normal adnexa and no mass, fullness, tenderness               Rectovaginal: Confirms               Anus:  normal sphincter tone, no lesions  Chaperone present: yes  A:  Well Woman with normal exam  Menopausal no HRT  Hypertension/asthma medication management  BMD due  P:   Reviewed health and wellness pertinent to exam  Aware of need to evaluate if vaginal bleeding  Continue follow up with PCP as indicated  Patient aware and will  schedule with mammogram  Pap smear as above not taken   counseled on breast self exam, mammography screening, adequate intake of calcium and vitamin D, diet and exercise, Kegel's exercises  return annually or prn  An After Visit Summary was printed and given to the patient.

## 2015-04-09 NOTE — Patient Instructions (Signed)

## 2015-04-11 NOTE — Progress Notes (Signed)
Encounter reviewed Markis Langland, MD   

## 2015-04-14 ENCOUNTER — Other Ambulatory Visit: Payer: Self-pay | Admitting: Certified Nurse Midwife

## 2015-04-15 NOTE — Telephone Encounter (Signed)
Medication refill request: Miranda Thompson Last AEX:  04-09-15 Next AEX: 04-10-16  Last MMG (if hormonal medication request): 04-25-14 WNL Refill authorized: please advise

## 2015-05-03 ENCOUNTER — Telehealth: Payer: Self-pay

## 2015-05-03 NOTE — Telephone Encounter (Signed)
Pt given bmd results. See scanned in results.

## 2015-05-31 ENCOUNTER — Other Ambulatory Visit: Payer: Self-pay | Admitting: Pulmonary Disease

## 2015-06-04 ENCOUNTER — Encounter: Payer: Self-pay | Admitting: Pulmonary Disease

## 2015-06-13 ENCOUNTER — Encounter: Payer: Self-pay | Admitting: Pulmonary Disease

## 2015-06-13 MED ORDER — DILTIAZEM HCL ER COATED BEADS 120 MG PO CP24: 120.0000 mg | ORAL_CAPSULE | Freq: Every day | ORAL | Status: DC

## 2015-06-13 MED ORDER — LOSARTAN POTASSIUM 50 MG PO TABS: 50.0000 mg | ORAL_TABLET | Freq: Every day | ORAL | Status: DC

## 2015-06-18 ENCOUNTER — Other Ambulatory Visit: Payer: Self-pay | Admitting: Pulmonary Disease

## 2015-07-16 NOTE — H&P (Signed)
Miranda Thompson is an 82 y.o. female.    Chief Complaint: right shoulder pain and weakness  HPI: Pt is a 82 y.o. female complaining of right shoulder pain for multiple years. Pain had continually increased since the beginning. X-rays in the clinic show end-stage arthritic changes of the right shoulder. Pt has tried various conservative treatments which have failed to alleviate their symptoms, including injections and therapy. Various options are discussed with the patient. Risks, benefits and expectations were discussed with the patient. Patient understand the risks, benefits and expectations and wishes to proceed with surgery.   PCP:  Noralee Space, MD  D/C Plans: Home  PMH: Past Medical History  Diagnosis Date  . Allergic rhinitis   . Bronchiectasis with acute exacerbation ([place])   . Hemoptysis   . History of pneumonia   . Hypercholesteremia   . Diverticulosis of colon   . DJD (degenerative joint disease)   . Low back pain syndrome   . Rotator cuff syndrome of right shoulder   . Osteopenia   . Anemia   . Family history of malignant neoplasm of gastrointestinal tract   . Pseudomonas infection   . Breast cancer, left breast ([place]) 1994 with lumpectomy    chemo,tamox  . Fibromyalgia   . Spinal stenosis   . Atrial fibrillation ([place])     goes in and out of this rhythm- takes Tenormin  . Hypertension   . COPD (chronic obstructive pulmonary disease) ([facility])     PSH: Past Surgical History  Procedure Laterality Date  . Lung biopsy  1968    TSurg---Dr. [name]  . Left breast lumpectomy and ln dissection  06/1992    Dr. [name]  . Tonsillectomy    . Colonoscopy    . Lung biopsy  2012    dr [name]  . Total knee arthroplasty Right 01/21/2015    Procedure: TOTAL RIGHT KNEE ARTHROPLASTY;  Surgeon: [name], MD;  Location: [facility];  Service: Orthopedics;  Laterality: Right;    Social History:  reports that she quit smoking about 48 years ago. Her smoking use included Cigarettes.  She has a 10 pack-year smoking history. She has never used smokeless tobacco. She reports that she drinks about 0.6 oz of alcohol per week. She reports that she does not use illicit drugs.  Allergies:  No Known Allergies  Medications: No current facility-administered medications for this encounter.   Current Outpatient Prescriptions  Medication Sig Dispense Refill  . Calcium Carb-Cholecalciferol 600-800 MG-UNIT TABS Take 1 tablet by mouth daily.    . cholecalciferol (VITAMIN D) 1000 UNITS tablet Take 1,000 Units by mouth daily.    Marland Kitchen diltiazem (CARDIZEM CD) 120 MG 24 hr capsule Take 1 capsule (120 mg total) by mouth daily. 30 capsule 5  . ferrous sulfate 325 (65 FE) MG tablet Take 1 tablet (325 mg total) by mouth 3 (three) times daily after meals.  3  . Fluticasone-Salmeterol (ADVAIR) 250-50 MCG/DOSE AEPB Inhale 1 puff into the lungs daily.    . Glucosamine-Chondroitin (GLUCOSAMINE CHONDR COMPLEX) 500-400 MG CAPS Take 1 tablet by mouth daily.     Marland Kitchen guaiFENesin (MUCINEX) 600 MG 12 hr tablet Take 1,200 mg by mouth 2 (two) times daily.    Marland Kitchen losartan (COZAAR) 50 MG tablet Take 1 tablet (50 mg total) by mouth daily. 30 tablet 5  . Multiple Vitamins-Minerals (MULTIVITAMIN & MINERAL PO) Take 1 tablet by mouth daily.      . Probiotic Product (ALIGN PO) Take 1 tablet by mouth  daily.     Merril Abbe 10 MCG TABS vaginal tablet PLACE 1 TABLET VAGINALLY TWICE A WEEK 24 tablet 4    No results found for this or any previous visit (from the past 48 hour(s)). No results found.  ROS: Pain with rom of the right upper extremity  Physical Exam:  Alert and oriented 82 y.o. female in no acute distress Cranial nerves 2-12 intact Cervical spine: full rom with no tenderness, nv intact distally Chest: active breath sounds bilaterally, no wheeze rhonchi or rales Heart: regular rate and rhythm, no murmur Abd: non tender non distended with active bowel sounds Hip is stable with rom  Right shoulder with limited  rom and strength due to arthropathy nv intact distally Crepitus with rom  Assessment/Plan Assessment: right rotator cuff arthropathy  Plan: Patient will undergo a right reverse total shoulder by Dr. Veverly Fells at [facility]. Risks benefits and expectations were discussed with the patient. Patient understand risks, benefits and expectations and wishes to proceed.

## 2015-07-18 ENCOUNTER — Ambulatory Visit: Payer: Medicare Other | Admitting: Pulmonary Disease

## 2015-07-23 NOTE — Pre-Procedure Instructions (Addendum)
ASHLYN ARMIGER  07/23/2015     Your procedure is scheduled on June 9  Report to [facility] Admitting at 8:20 A.M.  Call this number if you have problems the morning of surgery:  [phone]   Remember:  Do not eat food or drink liquids after midnight.  Take these medicines the morning of surgery with A SIP OF WATER Diltiazem, Advair,   STOP Calcium, Vitamin D, Glucosamine, Multiple Vitamins, Probiotic June 2   STOP/ Do not take Aspirin, Aleve, Naproxen, Advil, Ibuprofen, Motrin, Vitamins, Herbs, or Supplements starting June 2   Do not wear jewelry, make-up or nail polish.  Do not wear lotions, powders, or perfumes.  You may not wear deodorant.  Do not shave 48 hours prior to surgery.  Men may shave face and neck.  Do not bring valuables to the hospital.  [facility] is not responsible for any belongings or valuables.  Contacts, dentures or bridgework may not be worn into surgery.  Leave your suitcase in the car.  After surgery it may be brought to your room.  For patients admitted to the hospital, discharge time will be determined by your treatment team.  Patients discharged the day of surgery will not be allowed to drive home.   [facility] - Preparing for Surgery  Before surgery, you can play an important role.  Because skin is not sterile, your skin needs to be as free of germs as possible.  You can reduce the number of germs on you skin by washing with CHG (chlorahexidine gluconate) soap before surgery.  CHG is an antiseptic cleaner which kills germs and bonds with the skin to continue killing germs even after washing.  Please DO NOT use if you have an allergy to CHG or antibacterial soaps.  If your skin becomes reddened/irritated stop using the CHG and inform your nurse when you arrive at Short Stay.  Do not shave (including legs and underarms) for at least 48 hours prior to the first CHG shower.  You may shave your face.  Please follow these instructions  carefully:   1.  Shower with CHG Soap the night before surgery and the morning of Surgery.  2.  If you choose to wash your hair, wash your hair first as usual with your normal shampoo.  3.  After you shampoo, rinse your hair and body thoroughly to remove the shampoo.  4.  Use CHG as you would any other liquid soap.  You can apply CHG directly to the skin and wash gently with scrungie or a clean washcloth.  5.  Apply the CHG Soap to your body ONLY FROM THE NECK DOWN.  Do not use on open wounds or open sores.  Avoid contact with your eyes, ears, mouth and genitals (private parts).  Wash genitals (private parts) with your normal soap.  6.  Wash thoroughly, paying special attention to the area where your surgery will be performed.  7.  Thoroughly rinse your body with warm water from the neck down.  8.  DO NOT shower/wash with your normal soap after using and rinsing off the CHG Soap.  9.  Pat yourself dry with a clean towel.            10.  Wear clean pajamas.            11.  Place clean sheets on your bed the night of your first shower and do not sleep with pets.  Day of Surgery  Do not apply any lotions the morning of surgery.  Please wear clean clothes to the hospital/surgery center.

## 2015-07-24 ENCOUNTER — Encounter (HOSPITAL_COMMUNITY)
Admission: RE | Admit: 2015-07-24 | Discharge: 2015-07-24 | Disposition: A | Discharge status: Home / Self Care | Payer: Medicare Other | Source: Ambulatory Visit | Attending: Orthopedic Surgery | Admitting: Orthopedic Surgery

## 2015-07-24 ENCOUNTER — Encounter (HOSPITAL_COMMUNITY): Payer: Self-pay

## 2015-07-24 DIAGNOSIS — J449 Chronic obstructive pulmonary disease, unspecified: Secondary | ICD-10-CM | POA: Diagnosis not present

## 2015-07-24 DIAGNOSIS — I4891 Unspecified atrial fibrillation: Secondary | ICD-10-CM | POA: Insufficient documentation

## 2015-07-24 DIAGNOSIS — E785 Hyperlipidemia, unspecified: Secondary | ICD-10-CM | POA: Insufficient documentation

## 2015-07-24 DIAGNOSIS — Z96651 Presence of right artificial knee joint: Secondary | ICD-10-CM | POA: Insufficient documentation

## 2015-07-24 DIAGNOSIS — Z79899 Other long term (current) drug therapy: Secondary | ICD-10-CM | POA: Diagnosis not present

## 2015-07-24 DIAGNOSIS — Z87891 Personal history of nicotine dependence: Secondary | ICD-10-CM | POA: Diagnosis not present

## 2015-07-24 DIAGNOSIS — Z853 Personal history of malignant neoplasm of breast: Secondary | ICD-10-CM | POA: Insufficient documentation

## 2015-07-24 DIAGNOSIS — Z01818 Encounter for other preprocedural examination: Secondary | ICD-10-CM | POA: Diagnosis not present

## 2015-07-24 DIAGNOSIS — Z01812 Encounter for preprocedural laboratory examination: Secondary | ICD-10-CM | POA: Diagnosis not present

## 2015-07-24 DIAGNOSIS — I10 Essential (primary) hypertension: Secondary | ICD-10-CM | POA: Diagnosis not present

## 2015-07-24 HISTORY — DX: Cardiac arrhythmia, unspecified: I49.9

## 2015-07-24 HISTORY — DX: Pneumonia, unspecified organism: J18.9

## 2015-07-24 LAB — BASIC METABOLIC PANEL
Anion gap: 6 (ref 5–15)
BUN: 14 mg/dL (ref 6–20)
CO2: 27 mmol/L (ref 22–32)
Calcium: 9.1 mg/dL (ref 8.9–10.3)
Chloride: 99 mmol/L — ABNORMAL LOW (ref 101–111)
Creatinine, Ser: 0.64 mg/dL (ref 0.44–1.00)
GFR calc Af Amer: >60 mL/min (ref >60)
GFR calc non Af Amer: >60 mL/min (ref >60)
Glucose, Bld: 98 mg/dL (ref 65–99)
Potassium: 3.9 mmol/L (ref 3.5–5.1)
Sodium: 132 mmol/L — ABNORMAL LOW (ref 135–145)

## 2015-07-24 LAB — SURGICAL PCR SCREEN
MRSA, PCR: NEGATIVE
Staphylococcus aureus: NEGATIVE

## 2015-07-24 LAB — CBC
HCT: 34.9 % — ABNORMAL LOW (ref 36.0–46.0)
Hemoglobin: 11.6 g/dL — ABNORMAL LOW (ref 12.0–15.0)
MCH: 31.7 pg (ref 26.0–34.0)
MCHC: 33.2 g/dL (ref 30.0–36.0)
MCV: 95.4 fL (ref 78.0–100.0)
Platelets: 222 10*3/uL (ref 150–400)
RBC: 3.66 MIL/uL — ABNORMAL LOW (ref 3.87–5.11)
RDW: 12.8 % (ref 11.5–15.5)
WBC: 4.8 10*3/uL (ref 4.0–10.5)

## 2015-07-24 NOTE — H&P (Signed)
Miranda Thompson is an 82 y.o. female.    Chief Complaint: right shoulder pain and weakness  HPI: Pt is a 82 y.o. female complaining of right shoulder pain for multiple years. Pain had continually increased since the beginning. X-rays in the clinic show end-stage arthritic changes of the right shoulder. Pt has tried various conservative treatments which have failed to alleviate their symptoms, including injections and therapy. Various options are discussed with the patient. Risks, benefits and expectations were discussed with the patient. Patient understand the risks, benefits and expectations and wishes to proceed with surgery.   PCP:  Noralee Space, MD  D/C Plans: Home  PMH: Past Medical History  Diagnosis Date  . Allergic rhinitis   . Bronchiectasis with acute exacerbation ([place])   . Hemoptysis   . History of pneumonia   . Hypercholesteremia   . Diverticulosis of colon   . DJD (degenerative joint disease)   . Low back pain syndrome   . Rotator cuff syndrome of right shoulder   . Osteopenia   . Anemia   . Family history of malignant neoplasm of gastrointestinal tract   . Pseudomonas infection   . Breast cancer, left breast ([place]) 1994 with lumpectomy    chemo,tamox  . Fibromyalgia   . Spinal stenosis   . Atrial fibrillation ([place])     goes in and out of this rhythm- takes Tenormin  . Hypertension   . COPD (chronic obstructive pulmonary disease) ([place])   . Dysrhythmia     palpitations  . Pneumonia     hx    PSH: Past Surgical History  Procedure Laterality Date  . Lung biopsy  1968    TSurg---Dr. [name]  . Left breast lumpectomy and ln dissection  06/1992    Dr. [name]  . Tonsillectomy    . Colonoscopy    . Lung biopsy  2012    dr [name]  . Total knee arthroplasty Right 01/21/2015    Procedure: TOTAL RIGHT KNEE ARTHROPLASTY;  Surgeon: [name], MD;  Location: [facility];  Service: Orthopedics;  Laterality: Right;    Social History:  reports that she quit smoking  about 48 years ago. Her smoking use included Cigarettes. She has a 10 pack-year smoking history. She has never used smokeless tobacco. She reports that she drinks about 0.6 oz of alcohol per week. She reports that she does not use illicit drugs.  Allergies:  No Known Allergies  Medications: No current facility-administered medications for this encounter.   Current Outpatient Prescriptions  Medication Sig Dispense Refill  . Calcium Carb-Cholecalciferol 600-800 MG-UNIT TABS Take 1 tablet by mouth daily.    . cholecalciferol (VITAMIN D) 1000 UNITS tablet Take 1,000 Units by mouth daily.    Marland Kitchen diltiazem (CARDIZEM CD) 120 MG 24 hr capsule Take 1 capsule (120 mg total) by mouth daily. 30 capsule 5  . Fluticasone-Salmeterol (ADVAIR) 250-50 MCG/DOSE AEPB Inhale 1 puff into the lungs daily.    . Glucosamine-Chondroitin (GLUCOSAMINE CHONDR COMPLEX) 500-400 MG CAPS Take 1 tablet by mouth daily.     Marland Kitchen guaiFENesin (MUCINEX) 600 MG 12 hr tablet Take 1,200 mg by mouth 2 (two) times daily.    Marland Kitchen losartan (COZAAR) 50 MG tablet Take 1 tablet (50 mg total) by mouth daily. 30 tablet 5  . Multiple Vitamins-Minerals (MULTIVITAMIN & MINERAL PO) Take 1 tablet by mouth daily.      . Probiotic Product (ALIGN PO) Take 1 tablet by mouth daily.     Merril Abbe 10 MCG  TABS vaginal tablet PLACE 1 TABLET VAGINALLY TWICE A WEEK 24 tablet 4  . ferrous sulfate 325 (65 FE) MG tablet Take 1 tablet (325 mg total) by mouth 3 (three) times daily after meals. (Patient not taking: Reported on 07/19/2015)  3    Results for orders placed or performed during the hospital encounter of 07/24/15 (from the past 48 hour(s))  CBC     Status: Abnormal   Collection Time: 07/24/15 10:22 AM  Result Value Ref Range   WBC 4.8 4.0 - 10.5 K/uL   RBC 3.66 (L) 3.87 - 5.11 MIL/uL   Hemoglobin 11.6 (L) 12.0 - 15.0 g/dL   HCT 34.9 (L) 36.0 - 46.0 %   MCV 95.4 78.0 - 100.0 fL   MCH 31.7 26.0 - 34.0 pg   MCHC 33.2 30.0 - 36.0 g/dL   RDW 12.8 11.5 -  15.5 %   Platelets 222 150 - 400 K/uL   No results found.  ROS: Pain with rom of the right upper extremity  Physical Exam:  Alert and oriented 82 y.o. female in no acute distress Cranial nerves 2-12 intact Cervical spine: full rom with no tenderness, nv intact distally Chest: active breath sounds bilaterally, no wheeze rhonchi or rales Heart: regular rate and rhythm, no murmur Abd: non tender non distended with active bowel sounds Hip is stable with rom  Right shoulder with limited rom and function  nv intact distally Strength of ER and IR 3.5/5 No rashes   Assessment/Plan Assessment: right rotator cuff arthropathy  Plan: Patient will undergo a right reverse total shoulder by Dr. Veverly Fells at [facility]. Risks benefits and expectations were discussed with the patient. Patient understand risks, benefits and expectations and wishes to proceed.

## 2015-07-25 NOTE — Progress Notes (Signed)
Anesthesia Chart Review:  Pt is an 82 year old female scheduled for R reverse total shoulder arthroplasty on 08/02/2015 with Dr. Veverly Fells.   PCP is Dr. Teressa Lower. Cardiologist is Dr. Jenkins Rouge who sees pt for palpitations.   PMH includes:  HTN, palpitations, COPD, bronchiectasis, atrial fibrillation, anemia, hyperlipidemia, breast cancer. Former smoker. BMI 23.5. S/p R TKA 01/21/15.   Medications include: diltiazem, advair, losartan  Preoperative labs reviewed.    EKG 03/18/15: NSR. Possible LAE. Cannot rule out anterior infarct, age undetermined.   Holter monitor 08/28/14: No significant arrhythmias, PAC's PVC's  Echo 05/19/13:  - Left ventricle: The cavity size was normal. Systolic function was normal. The estimated ejection fraction was in the range of 55% to 60%. Wall motion was normal; there were no regional wall motion abnormalities. Doppler parameters are consistent with mildly elevated ventricular end-diastolic filling pressure. - Aortic valve: Trileaflet; mildly thickened leaflets. Transvalvular velocity was within the normal range. There was no stenosis. No regurgitation. - Aortic root: The aortic root was normal in size. - Mitral valve: Mildly thickened leaflets. Mild regurgitation. - Right ventricle: The cavity size was mildly dilated. Wall thickness was normal. Systolic function was normal. - Right atrium: The atrium was mildly dilated. - Atrial septum: No defect or patent foramen ovale was identified. - Tricuspid valve: Mild-moderate regurgitation. - Pulmonic valve: Mild regurgitation. - Pulmonary arteries: Systolic pressure was within the normal range. PA peak pressure: 57mm Hg (S). - Inferior vena cava: The vessel was normal in size.  If no changes, I anticipate pt can proceed with surgery as scheduled.   Willeen Cass, FNP-BC [facility]/Anesthesiology Phone: [phone] 07/25/2015 1:57 PM

## 2015-07-26 ENCOUNTER — Encounter: Payer: Self-pay | Admitting: Pulmonary Disease

## 2015-07-26 DIAGNOSIS — J449 Chronic obstructive pulmonary disease, unspecified: Secondary | ICD-10-CM

## 2015-07-26 DIAGNOSIS — E785 Hyperlipidemia, unspecified: Secondary | ICD-10-CM

## 2015-07-26 DIAGNOSIS — I10 Essential (primary) hypertension: Secondary | ICD-10-CM

## 2015-07-26 DIAGNOSIS — D649 Anemia, unspecified: Secondary | ICD-10-CM

## 2015-07-29 ENCOUNTER — Other Ambulatory Visit (INDEPENDENT_AMBULATORY_CARE_PROVIDER_SITE_OTHER): Payer: Medicare Other

## 2015-07-29 DIAGNOSIS — E785 Hyperlipidemia, unspecified: Secondary | ICD-10-CM | POA: Diagnosis not present

## 2015-07-29 DIAGNOSIS — D649 Anemia, unspecified: Secondary | ICD-10-CM

## 2015-07-29 DIAGNOSIS — J449 Chronic obstructive pulmonary disease, unspecified: Secondary | ICD-10-CM

## 2015-07-29 DIAGNOSIS — I10 Essential (primary) hypertension: Secondary | ICD-10-CM | POA: Diagnosis not present

## 2015-07-29 LAB — CBC WITH DIFFERENTIAL/PLATELET
Basophils Absolute: 0 10*3/uL (ref 0.0–0.1)
Basophils Relative: 0.3 % (ref 0.0–3.0)
Eosinophils Absolute: 0.2 10*3/uL (ref 0.0–0.7)
Eosinophils Relative: 3.7 % (ref 0.0–5.0)
HCT: 35.8 % — ABNORMAL LOW (ref 36.0–46.0)
Hemoglobin: 12.2 g/dL (ref 12.0–15.0)
Lymphocytes Relative: 22.6 % (ref 12.0–46.0)
Lymphs Abs: 1.3 10*3/uL (ref 0.7–4.0)
MCHC: 34 g/dL (ref 30.0–36.0)
MCV: 97.2 fl (ref 78.0–100.0)
Monocytes Absolute: 0.5 10*3/uL (ref 0.1–1.0)
Monocytes Relative: 9.9 % (ref 3.0–12.0)
Neutro Abs: 3.5 10*3/uL (ref 1.4–7.7)
Neutrophils Relative %: 63.5 % (ref 43.0–77.0)
Platelets: 258 10*3/uL (ref 150.0–400.0)
RBC: 3.69 Mil/uL — ABNORMAL LOW (ref 3.87–5.11)
RDW: 13.3 % (ref 11.5–15.5)
WBC: 5.6 10*3/uL (ref 4.0–10.5)

## 2015-07-29 LAB — HEPATIC FUNCTION PANEL
ALT: 14 U/L (ref 0–35)
AST: 21 U/L (ref 0–37)
Albumin: 4 g/dL (ref 3.5–5.2)
Alkaline Phosphatase: 60 U/L (ref 39–117)
Bilirubin, Direct: 0.1 mg/dL (ref 0.0–0.3)
Total Bilirubin: 0.6 mg/dL (ref 0.2–1.2)
Total Protein: 7.4 g/dL (ref 6.0–8.3)

## 2015-07-29 LAB — LIPID PANEL
Cholesterol: 174 mg/dL (ref 0–200)
HDL: 61.7 mg/dL (ref >39.00)
LDL Cholesterol: 100 mg/dL — ABNORMAL HIGH (ref 0–99)
NonHDL: 112.53
Total CHOL/HDL Ratio: 3
Triglycerides: 64 mg/dL (ref 0.0–149.0)
VLDL: 12.8 mg/dL (ref 0.0–40.0)

## 2015-07-29 LAB — TSH: TSH: 2.61 u[IU]/mL (ref 0.35–4.50)

## 2015-07-29 LAB — BASIC METABOLIC PANEL
BUN: 17 mg/dL (ref 6–23)
CO2: 29 mEq/L (ref 19–32)
Calcium: 9.2 mg/dL (ref 8.4–10.5)
Chloride: 98 mEq/L (ref 96–112)
Creatinine, Ser: 0.58 mg/dL (ref 0.40–1.20)
GFR: 106.26 mL/min (ref >60.00)
Glucose, Bld: 93 mg/dL (ref 70–99)
Potassium: 4.6 mEq/L (ref 3.5–5.1)
Sodium: 132 mEq/L — ABNORMAL LOW (ref 135–145)

## 2015-08-01 ENCOUNTER — Ambulatory Visit (INDEPENDENT_AMBULATORY_CARE_PROVIDER_SITE_OTHER): Payer: Medicare Other | Admitting: Pulmonary Disease

## 2015-08-01 ENCOUNTER — Ambulatory Visit (INDEPENDENT_AMBULATORY_CARE_PROVIDER_SITE_OTHER)
Admission: RE | Admit: 2015-08-01 | Discharge: 2015-08-01 | Disposition: A | Discharge status: Home / Self Care | Payer: Medicare Other | Source: Ambulatory Visit | Attending: Pulmonary Disease | Admitting: Pulmonary Disease

## 2015-08-01 ENCOUNTER — Encounter (HOSPITAL_COMMUNITY): Payer: Self-pay | Admitting: Anesthesiology

## 2015-08-01 VITALS — BP 122/60 | HR 88 | Temp 97.0°F | Ht 63.75 in | Wt 134.8 lb

## 2015-08-01 DIAGNOSIS — J479 Bronchiectasis, uncomplicated: Secondary | ICD-10-CM

## 2015-08-01 DIAGNOSIS — I10 Essential (primary) hypertension: Secondary | ICD-10-CM

## 2015-08-01 DIAGNOSIS — M150 Primary generalized (osteo)arthritis: Secondary | ICD-10-CM | POA: Diagnosis not present

## 2015-08-01 DIAGNOSIS — M159 Polyosteoarthritis, unspecified: Secondary | ICD-10-CM

## 2015-08-01 DIAGNOSIS — M8949 Other hypertrophic osteoarthropathy, multiple sites: Secondary | ICD-10-CM

## 2015-08-01 DIAGNOSIS — I491 Atrial premature depolarization: Secondary | ICD-10-CM

## 2015-08-01 DIAGNOSIS — Z96651 Presence of right artificial knee joint: Secondary | ICD-10-CM

## 2015-08-01 NOTE — Addendum Note (Signed)
Addendum  created 08/01/15 1736 by Moshe Salisbury, CRNA   Modules edited: Anesthesia Flowsheet

## 2015-08-01 NOTE — Anesthesia Preprocedure Evaluation (Addendum)
Anesthesia Evaluation  Patient identified by MRN, date of birth, ID band Patient awake    Reviewed: Allergy & Precautions, NPO status , Patient's Chart, lab work & pertinent test results, reviewed documented beta blocker date and time   History of Anesthesia Complications Negative for: history of anesthetic complications  Airway Mallampati: II  TM Distance: >3 FB Neck ROM: Full    Dental  (+) Teeth Intact, Dental Advisory Given   Pulmonary pneumonia, unresolved, COPD,  COPD inhaler, former smoker,    Pulmonary exam normal        Cardiovascular hypertension, Pt. on medications and Pt. on home beta blockers Normal cardiovascular exam+ dysrhythmias Atrial Fibrillation      Neuro/Psych  Neuromuscular disease negative psych ROS   GI/Hepatic negative GI ROS, Neg liver ROS,   Endo/Other  Hyperlipidemia   Renal/GU negative Renal ROS  negative genitourinary   Musculoskeletal  (+) Fibromyalgia -Hyperlipidemia DJD right shoulder    Abdominal   Peds  Hematology  (+) anemia ,   Anesthesia Other Findings   Reproductive/Obstetrics                           Anesthesia Physical  Anesthesia Plan  ASA: III  Anesthesia Plan: General and Regional   Post-op Pain Management: GA combined w/ Regional for post-op pain   Induction:   Airway Management Planned: Oral ETT  Additional Equipment:   Intra-op Plan:   Post-operative Plan:   Informed Consent: I have reviewed the patients History and Physical, chart, labs and discussed the procedure including the risks, benefits and alternatives for the proposed anesthesia with the patient or authorized representative who has indicated his/her understanding and acceptance.   Dental advisory given  Plan Discussed with: CRNA, Anesthesiologist and Surgeon  Anesthesia Plan Comments:        Anesthesia Quick Evaluation

## 2015-08-01 NOTE — Patient Instructions (Signed)
Today we updated your med list in our EPIC system...    Continue your current medications the same...  Today we checked a follow up CXR & we checked your FASTING blood work...    We will contact you w/ the results when available...   Good luck w/ the upcoming right shoulder surgery...  Call for any questions...  Let's plan a follow up visit in 65mo, sooner if needed for problems.Marland KitchenMarland Kitchen

## 2015-08-02 ENCOUNTER — Encounter (HOSPITAL_COMMUNITY)
Admission: RE | Disposition: A | Discharge status: Home / Self Care | Payer: Self-pay | Source: Ambulatory Visit | Attending: Orthopedic Surgery

## 2015-08-02 ENCOUNTER — Inpatient Hospital Stay (HOSPITAL_COMMUNITY): Payer: Medicare Other | Admitting: Anesthesiology

## 2015-08-02 ENCOUNTER — Inpatient Hospital Stay (HOSPITAL_COMMUNITY): Payer: Medicare Other

## 2015-08-02 ENCOUNTER — Encounter (HOSPITAL_COMMUNITY): Payer: Self-pay | Admitting: General Practice

## 2015-08-02 ENCOUNTER — Inpatient Hospital Stay (HOSPITAL_COMMUNITY)
Admission: RE | Admit: 2015-08-02 | Discharge: 2015-08-03 | DRG: 483 | Disposition: A | Discharge status: Home / Self Care | Payer: Medicare Other | Source: Ambulatory Visit | Attending: Orthopedic Surgery | Admitting: Orthopedic Surgery

## 2015-08-02 ENCOUNTER — Inpatient Hospital Stay (HOSPITAL_COMMUNITY): Payer: Medicare Other | Admitting: Emergency Medicine

## 2015-08-02 DIAGNOSIS — M75101 Unspecified rotator cuff tear or rupture of right shoulder, not specified as traumatic: Secondary | ICD-10-CM | POA: Diagnosis present

## 2015-08-02 DIAGNOSIS — D649 Anemia, unspecified: Secondary | ICD-10-CM | POA: Diagnosis present

## 2015-08-02 DIAGNOSIS — Z87891 Personal history of nicotine dependence: Secondary | ICD-10-CM | POA: Diagnosis not present

## 2015-08-02 DIAGNOSIS — E871 Hypo-osmolality and hyponatremia: Secondary | ICD-10-CM | POA: Diagnosis present

## 2015-08-02 DIAGNOSIS — Z96619 Presence of unspecified artificial shoulder joint: Secondary | ICD-10-CM

## 2015-08-02 DIAGNOSIS — J449 Chronic obstructive pulmonary disease, unspecified: Secondary | ICD-10-CM | POA: Diagnosis present

## 2015-08-02 DIAGNOSIS — Z96611 Presence of right artificial shoulder joint: Secondary | ICD-10-CM

## 2015-08-02 DIAGNOSIS — M4800 Spinal stenosis, site unspecified: Secondary | ICD-10-CM | POA: Diagnosis present

## 2015-08-02 DIAGNOSIS — E7800 Pure hypercholesterolemia, unspecified: Secondary | ICD-10-CM | POA: Diagnosis present

## 2015-08-02 DIAGNOSIS — I4891 Unspecified atrial fibrillation: Secondary | ICD-10-CM | POA: Diagnosis present

## 2015-08-02 DIAGNOSIS — M67411 Ganglion, right shoulder: Secondary | ICD-10-CM | POA: Diagnosis present

## 2015-08-02 DIAGNOSIS — Z9221 Personal history of antineoplastic chemotherapy: Secondary | ICD-10-CM | POA: Diagnosis not present

## 2015-08-02 DIAGNOSIS — M479 Spondylosis, unspecified: Secondary | ICD-10-CM | POA: Diagnosis present

## 2015-08-02 DIAGNOSIS — Z7951 Long term (current) use of inhaled steroids: Secondary | ICD-10-CM | POA: Diagnosis not present

## 2015-08-02 DIAGNOSIS — M19011 Primary osteoarthritis, right shoulder: Principal | ICD-10-CM | POA: Diagnosis present

## 2015-08-02 DIAGNOSIS — Z96651 Presence of right artificial knee joint: Secondary | ICD-10-CM | POA: Diagnosis present

## 2015-08-02 DIAGNOSIS — M8580 Other specified disorders of bone density and structure, unspecified site: Secondary | ICD-10-CM | POA: Diagnosis present

## 2015-08-02 DIAGNOSIS — M797 Fibromyalgia: Secondary | ICD-10-CM | POA: Diagnosis present

## 2015-08-02 DIAGNOSIS — Z853 Personal history of malignant neoplasm of breast: Secondary | ICD-10-CM

## 2015-08-02 DIAGNOSIS — I10 Essential (primary) hypertension: Secondary | ICD-10-CM | POA: Diagnosis present

## 2015-08-02 DIAGNOSIS — M25511 Pain in right shoulder: Secondary | ICD-10-CM | POA: Diagnosis present

## 2015-08-02 HISTORY — PX: REVERSE SHOULDER ARTHROPLASTY: SHX5054

## 2015-08-02 HISTORY — PX: REVERSE TOTAL SHOULDER ARTHROPLASTY: SHX2344

## 2015-08-02 SURGERY — ARTHROPLASTY, SHOULDER, TOTAL, REVERSE
Anesthesia: Regional | Site: Shoulder | Laterality: Right

## 2015-08-02 MED ORDER — ONDANSETRON HCL 4 MG/2ML IJ SOLN: 4.0000 mg | Freq: Four times a day (QID) | INTRAMUSCULAR | Status: DC | PRN

## 2015-08-02 MED ORDER — FENTANYL CITRATE (PF) 250 MCG/5ML IJ SOLN
INTRAMUSCULAR | Status: AC
  Filled 2015-08-02: qty 5

## 2015-08-02 MED ORDER — METOCLOPRAMIDE HCL 5 MG/ML IJ SOLN: 5.0000 mg | Freq: Three times a day (TID) | INTRAMUSCULAR | Status: DC | PRN

## 2015-08-02 MED ORDER — VITAMIN D3 25 MCG (1000 UNIT) PO TABS
1000.0000 [IU] | ORAL_TABLET | Freq: Every day | ORAL | Status: DC
  Administered 2015-08-02 – 2015-08-03 (×2): 1000 [IU] via ORAL
  Filled 2015-08-02 (×4): qty 1

## 2015-08-02 MED ORDER — DOCUSATE SODIUM 100 MG PO CAPS
100.0000 mg | ORAL_CAPSULE | Freq: Two times a day (BID) | ORAL | Status: DC
  Administered 2015-08-02 – 2015-08-03 (×3): 100 mg via ORAL
  Filled 2015-08-02 (×3): qty 1

## 2015-08-02 MED ORDER — HYDROCODONE-ACETAMINOPHEN 5-325 MG PO TABS
1.0000 | ORAL_TABLET | ORAL | Status: DC | PRN
  Administered 2015-08-03 (×2): 2 via ORAL
  Filled 2015-08-02 (×2): qty 2

## 2015-08-02 MED ORDER — CHLORHEXIDINE GLUCONATE 4 % EX LIQD: 60.0000 mL | Freq: Once | CUTANEOUS | Status: DC

## 2015-08-02 MED ORDER — LIDOCAINE 2% (20 MG/ML) 5 ML SYRINGE
INTRAMUSCULAR | Status: AC
  Filled 2015-08-02: qty 5

## 2015-08-02 MED ORDER — GUAIFENESIN ER 600 MG PO TB12
1200.0000 mg | ORAL_TABLET | Freq: Two times a day (BID) | ORAL | Status: DC
  Administered 2015-08-02 – 2015-08-03 (×2): 1200 mg via ORAL
  Filled 2015-08-02 (×3): qty 2

## 2015-08-02 MED ORDER — CEFAZOLIN SODIUM-DEXTROSE 2-4 GM/100ML-% IV SOLN
2.0000 g | Freq: Four times a day (QID) | INTRAVENOUS | Status: AC
  Administered 2015-08-03 (×2): 2 g via INTRAVENOUS
  Filled 2015-08-02 (×2): qty 100

## 2015-08-02 MED ORDER — BUPIVACAINE-EPINEPHRINE (PF) 0.5% -1:200000 IJ SOLN
INTRAMUSCULAR | Status: DC | PRN
  Administered 2015-08-02: 30 mL via PERINEURAL

## 2015-08-02 MED ORDER — SODIUM CHLORIDE 0.9 % IV SOLN
INTRAVENOUS | Status: DC
Start: 2015-08-02 — End: 2015-08-03
  Administered 2015-08-03: 05:00:00 via INTRAVENOUS

## 2015-08-02 MED ORDER — ONDANSETRON HCL 4 MG/2ML IJ SOLN
INTRAMUSCULAR | Status: DC | PRN
  Administered 2015-08-02: 4 mg via INTRAVENOUS

## 2015-08-02 MED ORDER — ACETAMINOPHEN 325 MG PO TABS: 650.0000 mg | ORAL_TABLET | Freq: Four times a day (QID) | ORAL | Status: DC | PRN

## 2015-08-02 MED ORDER — ROCURONIUM BROMIDE 100 MG/10ML IV SOLN
INTRAVENOUS | Status: DC | PRN
  Administered 2015-08-02: 30 mg via INTRAVENOUS

## 2015-08-02 MED ORDER — METOCLOPRAMIDE HCL 5 MG PO TABS: 5.0000 mg | ORAL_TABLET | Freq: Three times a day (TID) | ORAL | Status: DC | PRN

## 2015-08-02 MED ORDER — PROPOFOL 10 MG/ML IV BOLUS
INTRAVENOUS | Status: DC | PRN
  Administered 2015-08-02: 120 mg via INTRAVENOUS

## 2015-08-02 MED ORDER — LACTATED RINGERS IV SOLN
INTRAVENOUS | Status: DC
  Administered 2015-08-02 (×2): via INTRAVENOUS

## 2015-08-02 MED ORDER — CEFAZOLIN SODIUM-DEXTROSE 2-4 GM/100ML-% IV SOLN
2.0000 g | Freq: Four times a day (QID) | INTRAVENOUS | Status: DC
  Administered 2015-08-02: 2 g via INTRAVENOUS
  Filled 2015-08-02 (×4): qty 100

## 2015-08-02 MED ORDER — CEFAZOLIN SODIUM-DEXTROSE 2-4 GM/100ML-% IV SOLN
INTRAVENOUS | Status: AC
  Filled 2015-08-02: qty 100

## 2015-08-02 MED ORDER — PHENOL 1.4 % MT LIQD: 1.0000 | OROMUCOSAL | Status: DC | PRN

## 2015-08-02 MED ORDER — CALCIUM CARBONATE-VITAMIN D 500-200 MG-UNIT PO TABS
1.0000 | ORAL_TABLET | Freq: Every day | ORAL | Status: DC
  Administered 2015-08-02 – 2015-08-03 (×2): 1 via ORAL
  Filled 2015-08-02 (×3): qty 1

## 2015-08-02 MED ORDER — MIDAZOLAM HCL 2 MG/2ML IJ SOLN
INTRAMUSCULAR | Status: AC
  Administered 2015-08-02: 1 mg
  Filled 2015-08-02: qty 2

## 2015-08-02 MED ORDER — CEFAZOLIN SODIUM-DEXTROSE 2-4 GM/100ML-% IV SOLN: 2.0000 g | INTRAVENOUS | Status: DC

## 2015-08-02 MED ORDER — PHENYLEPHRINE HCL 10 MG/ML IJ SOLN
10.0000 mg | INTRAMUSCULAR | Status: DC | PRN
  Administered 2015-08-02: 50 ug/min via INTRAVENOUS

## 2015-08-02 MED ORDER — ONDANSETRON HCL 4 MG PO TABS: 4.0000 mg | ORAL_TABLET | Freq: Four times a day (QID) | ORAL | Status: DC | PRN

## 2015-08-02 MED ORDER — CEFAZOLIN SODIUM-DEXTROSE 2-4 GM/100ML-% IV SOLN
2.0000 g | Freq: Four times a day (QID) | INTRAVENOUS | Status: DC
  Filled 2015-08-02 (×2): qty 100

## 2015-08-02 MED ORDER — PROPOFOL 10 MG/ML IV BOLUS
INTRAVENOUS | Status: AC
  Filled 2015-08-02: qty 20

## 2015-08-02 MED ORDER — DEXTROSE 5 % IV SOLN
500.0000 mg | Freq: Four times a day (QID) | INTRAVENOUS | Status: DC | PRN
  Filled 2015-08-02: qty 5

## 2015-08-02 MED ORDER — POLYETHYLENE GLYCOL 3350 17 G PO PACK: 17.0000 g | PACK | Freq: Every day | ORAL | Status: DC | PRN

## 2015-08-02 MED ORDER — FENTANYL CITRATE (PF) 100 MCG/2ML IJ SOLN: 25.0000 ug | INTRAMUSCULAR | Status: DC | PRN

## 2015-08-02 MED ORDER — MEPERIDINE HCL 25 MG/ML IJ SOLN: 6.2500 mg | INTRAMUSCULAR | Status: DC | PRN

## 2015-08-02 MED ORDER — LOSARTAN POTASSIUM 50 MG PO TABS
50.0000 mg | ORAL_TABLET | Freq: Every day | ORAL | Status: DC
  Administered 2015-08-03: 50 mg via ORAL
  Filled 2015-08-02: qty 1

## 2015-08-02 MED ORDER — SUGAMMADEX SODIUM 200 MG/2ML IV SOLN
INTRAVENOUS | Status: AC
  Filled 2015-08-02: qty 2

## 2015-08-02 MED ORDER — ROCURONIUM BROMIDE 50 MG/5ML IV SOLN
INTRAVENOUS | Status: AC
  Filled 2015-08-02: qty 1

## 2015-08-02 MED ORDER — ONDANSETRON HCL 4 MG/2ML IJ SOLN
INTRAMUSCULAR | Status: AC
Start: 2015-08-02 — End: 2015-08-02
  Filled 2015-08-02: qty 2

## 2015-08-02 MED ORDER — METOCLOPRAMIDE HCL 5 MG/ML IJ SOLN: 10.0000 mg | Freq: Once | INTRAMUSCULAR | Status: DC | PRN

## 2015-08-02 MED ORDER — SODIUM CHLORIDE 0.9 % IR SOLN
Status: DC | PRN
  Administered 2015-08-02: 1000 mL

## 2015-08-02 MED ORDER — BUPIVACAINE-EPINEPHRINE 0.25% -1:200000 IJ SOLN
INTRAMUSCULAR | Status: DC | PRN
  Administered 2015-08-02: 5 mL

## 2015-08-02 MED ORDER — FENTANYL CITRATE (PF) 100 MCG/2ML IJ SOLN
INTRAMUSCULAR | Status: AC
  Administered 2015-08-02: 50 ug
  Filled 2015-08-02: qty 2

## 2015-08-02 MED ORDER — METHOCARBAMOL 500 MG PO TABS: 500.0000 mg | ORAL_TABLET | Freq: Four times a day (QID) | ORAL | Status: DC | PRN

## 2015-08-02 MED ORDER — MENTHOL 3 MG MT LOZG: 1.0000 | LOZENGE | OROMUCOSAL | Status: DC | PRN

## 2015-08-02 MED ORDER — SACCHAROMYCES BOULARDII 250 MG PO CAPS
ORAL_CAPSULE | Freq: Every day | ORAL | Status: DC
  Administered 2015-08-02 – 2015-08-03 (×2): 250 mg via ORAL
  Filled 2015-08-02 (×3): qty 1

## 2015-08-02 MED ORDER — ADULT MULTIVITAMIN LIQUID CH
Freq: Every day | ORAL | Status: DC
  Administered 2015-08-02 – 2015-08-03 (×2): 15 mL via ORAL
  Filled 2015-08-02 (×2): qty 15

## 2015-08-02 MED ORDER — MORPHINE SULFATE (PF) 2 MG/ML IV SOLN
2.0000 mg | INTRAVENOUS | Status: DC | PRN
  Administered 2015-08-03: 2 mg via INTRAVENOUS
  Filled 2015-08-02: qty 1

## 2015-08-02 MED ORDER — FERROUS SULFATE 325 (65 FE) MG PO TABS
325.0000 mg | ORAL_TABLET | Freq: Three times a day (TID) | ORAL | Status: DC
  Administered 2015-08-02 – 2015-08-03 (×3): 325 mg via ORAL
  Filled 2015-08-02 (×3): qty 1

## 2015-08-02 MED ORDER — ACETAMINOPHEN 650 MG RE SUPP: 650.0000 mg | Freq: Four times a day (QID) | RECTAL | Status: DC | PRN

## 2015-08-02 MED ORDER — BISACODYL 10 MG RE SUPP: 10.0000 mg | Freq: Every day | RECTAL | Status: DC | PRN

## 2015-08-02 MED ORDER — ACETAMINOPHEN 500 MG PO TABS
500.0000 mg | ORAL_TABLET | Freq: Four times a day (QID) | ORAL | Status: DC | PRN
Start: 2015-08-02 — End: 2015-08-02

## 2015-08-02 MED ORDER — MOMETASONE FURO-FORMOTEROL FUM 200-5 MCG/ACT IN AERO
2.0000 | INHALATION_SPRAY | Freq: Two times a day (BID) | RESPIRATORY_TRACT | Status: DC
  Administered 2015-08-02 – 2015-08-03 (×2): 2 via RESPIRATORY_TRACT
  Filled 2015-08-02: qty 8.8

## 2015-08-02 MED ORDER — HYDROCODONE-ACETAMINOPHEN 5-325 MG PO TABS: 1.0000 | ORAL_TABLET | Freq: Four times a day (QID) | ORAL | Status: DC | PRN

## 2015-08-02 MED ORDER — DILTIAZEM HCL ER COATED BEADS 120 MG PO CP24
120.0000 mg | ORAL_CAPSULE | Freq: Every day | ORAL | Status: DC
  Administered 2015-08-03: 120 mg via ORAL
  Filled 2015-08-02: qty 1

## 2015-08-02 MED ORDER — FENTANYL CITRATE (PF) 100 MCG/2ML IJ SOLN
INTRAMUSCULAR | Status: DC | PRN
  Administered 2015-08-02: 50 ug via INTRAVENOUS
  Administered 2015-08-02: 100 ug via INTRAVENOUS

## 2015-08-02 MED ORDER — LIDOCAINE HCL (CARDIAC) 20 MG/ML IV SOLN
INTRAVENOUS | Status: DC | PRN
  Administered 2015-08-02: 40 mg via INTRAVENOUS

## 2015-08-02 MED ORDER — CEFAZOLIN SODIUM-DEXTROSE 2-4 GM/100ML-% IV SOLN
2.0000 g | INTRAVENOUS | Status: AC
  Administered 2015-08-02: 2 g via INTRAVENOUS

## 2015-08-02 MED ORDER — SUGAMMADEX SODIUM 200 MG/2ML IV SOLN
INTRAVENOUS | Status: DC | PRN
  Administered 2015-08-02: 120 mg via INTRAVENOUS

## 2015-08-02 SURGICAL SUPPLY — 69 items
BIT DRILL 170X2.5X (BIT) ×1 IMPLANT
BIT DRILL 5/64X5 DISP (BIT) ×3 IMPLANT
BIT DRL 170X2.5X (BIT) ×1
BLADE SAG 18X100X1.27 (BLADE) ×3 IMPLANT
BOWL SMART MIX CTS (DISPOSABLE) ×3 IMPLANT
CAPT SHLDR REVTOTAL 1 ×3 IMPLANT
CEMENT BONE DEPUY (Cement) ×3 IMPLANT
CLOSURE WOUND 1/2 X4 (GAUZE/BANDAGES/DRESSINGS) ×1
COVER SURGICAL LIGHT HANDLE (MISCELLANEOUS) ×3 IMPLANT
DRAPE IMP U-DRAPE 54X76 (DRAPES) ×3 IMPLANT
DRAPE INCISE IOBAN 66X45 STRL (DRAPES) ×3 IMPLANT
DRAPE ORTHO SPLIT 77X108 STRL (DRAPES) ×4
DRAPE SURG ORHT 6 SPLT 77X108 (DRAPES) ×2 IMPLANT
DRAPE U-SHAPE 47X51 STRL (DRAPES) ×3 IMPLANT
DRAPE X-RAY CASS 24X20 (DRAPES) IMPLANT
DRILL 2.5 (BIT) ×2
DRSG ADAPTIC 3X8 NADH LF (GAUZE/BANDAGES/DRESSINGS) ×3 IMPLANT
DRSG PAD ABDOMINAL 8X10 ST (GAUZE/BANDAGES/DRESSINGS) ×3 IMPLANT
DURAPREP 26ML APPLICATOR (WOUND CARE) ×3 IMPLANT
ELECT BLADE 4.0 EZ CLEAN MEGAD (MISCELLANEOUS) ×3
ELECT NEEDLE TIP 2.8 STRL (NEEDLE) ×3 IMPLANT
ELECT REM PT RETURN 9FT ADLT (ELECTROSURGICAL) ×3
ELECTRODE BLDE 4.0 EZ CLN MEGD (MISCELLANEOUS) ×1 IMPLANT
ELECTRODE REM PT RTRN 9FT ADLT (ELECTROSURGICAL) ×1 IMPLANT
GAUZE SPONGE 4X4 12PLY STRL (GAUZE/BANDAGES/DRESSINGS) ×3 IMPLANT
GLOVE BIOGEL PI ORTHO PRO 7.5 (GLOVE) ×2
GLOVE BIOGEL PI ORTHO PRO SZ8 (GLOVE) ×2
GLOVE ORTHO TXT STRL SZ7.5 (GLOVE) ×3 IMPLANT
GLOVE PI ORTHO PRO STRL 7.5 (GLOVE) ×1 IMPLANT
GLOVE PI ORTHO PRO STRL SZ8 (GLOVE) ×1 IMPLANT
GLOVE SURG ORTHO 8.5 STRL (GLOVE) ×3 IMPLANT
GOWN STRL REUS W/ TWL LRG LVL3 (GOWN DISPOSABLE) ×1 IMPLANT
GOWN STRL REUS W/ TWL XL LVL3 (GOWN DISPOSABLE) ×2 IMPLANT
GOWN STRL REUS W/TWL LRG LVL3 (GOWN DISPOSABLE) ×2
GOWN STRL REUS W/TWL XL LVL3 (GOWN DISPOSABLE) ×4
HANDPIECE INTERPULSE COAX TIP (DISPOSABLE)
KIT BASIN OR (CUSTOM PROCEDURE TRAY) ×3 IMPLANT
KIT ROOM TURNOVER OR (KITS) ×3 IMPLANT
MANIFOLD NEPTUNE II (INSTRUMENTS) ×3 IMPLANT
NEEDLE 1/2 CIR MAYO (NEEDLE) ×3 IMPLANT
NEEDLE HYPO 25GX1X1/2 BEV (NEEDLE) ×3 IMPLANT
NS IRRIG 1000ML POUR BTL (IV SOLUTION) ×3 IMPLANT
PACK SHOULDER (CUSTOM PROCEDURE TRAY) ×3 IMPLANT
PAD ARMBOARD 7.5X6 YLW CONV (MISCELLANEOUS) ×6 IMPLANT
PIN METAGLENE 2.5 (PIN) ×6 IMPLANT
SET HNDPC FAN SPRY TIP SCT (DISPOSABLE) IMPLANT
SLING ARM LRG ADULT FOAM STRAP (SOFTGOODS) IMPLANT
SLING ARM MED ADULT FOAM STRAP (SOFTGOODS) ×3 IMPLANT
SPONGE LAP 18X18 X RAY DECT (DISPOSABLE) ×3 IMPLANT
SPONGE LAP 4X18 X RAY DECT (DISPOSABLE) IMPLANT
STRIP CLOSURE SKIN 1/2X4 (GAUZE/BANDAGES/DRESSINGS) ×2 IMPLANT
SUCTION FRAZIER HANDLE 10FR (MISCELLANEOUS) ×2
SUCTION TUBE FRAZIER 10FR DISP (MISCELLANEOUS) ×1 IMPLANT
SUT FIBERWIRE #2 38 T-5 BLUE (SUTURE) ×6
SUT MNCRL AB 4-0 PS2 18 (SUTURE) ×3 IMPLANT
SUT VIC AB 0 CT2 27 (SUTURE) ×3 IMPLANT
SUT VIC AB 2-0 CT1 27 (SUTURE) ×2
SUT VIC AB 2-0 CT1 TAPERPNT 27 (SUTURE) ×1 IMPLANT
SUT VICRYL 0 CT 1 36IN (SUTURE) ×3 IMPLANT
SUTURE FIBERWR #2 38 T-5 BLUE (SUTURE) ×2 IMPLANT
SYR CONTROL 10ML LL (SYRINGE) ×3 IMPLANT
TOWEL OR 17X24 6PK STRL BLUE (TOWEL DISPOSABLE) ×3 IMPLANT
TOWEL OR 17X26 10 PK STRL BLUE (TOWEL DISPOSABLE) ×3 IMPLANT
TOWER CARTRIDGE SMART MIX (DISPOSABLE) IMPLANT
TRAY FOLEY CATH 16FRSI W/METER (SET/KITS/TRAYS/PACK) IMPLANT
TUBE CONNECTING 12'X1/4 (SUCTIONS) ×1
TUBE CONNECTING 12X1/4 (SUCTIONS) ×2 IMPLANT
WATER STERILE IRR 1000ML POUR (IV SOLUTION) ×3 IMPLANT
YANKAUER SUCT BULB TIP NO VENT (SUCTIONS) ×3 IMPLANT

## 2015-08-02 NOTE — Anesthesia Postprocedure Evaluation (Addendum)
Anesthesia Post Note  Patient: Miranda Thompson  Procedure(s) Performed: Procedure(s) (LRB): RIGHT REVERSE TOTAL SHOULDER ARTHROPLASTY (Right)  Patient location during evaluation: PACU Anesthesia Type: General and Regional Level of consciousness: awake and alert and oriented Pain management: pain level controlled Vital Signs Assessment: post-procedure vital signs reviewed and stable Respiratory status: spontaneous breathing, nonlabored ventilation and respiratory function stable Cardiovascular status: blood pressure returned to baseline and stable Postop Assessment: no signs of nausea or vomiting Anesthetic complications: no Comments: No pain in PACU    Last Vitals:  Filed Vitals:   08/02/15 1232 08/02/15 1246  BP: 80/44 104/58  Pulse: 75 78  Temp:    Resp: 16 14    Last Pain: There were no vitals filed for this visit.               Chinedum Vanhouten A.

## 2015-08-02 NOTE — Brief Op Note (Signed)
08/02/2015  11:55 AM  PATIENT:  [name]  82 y.o. female  PRE-OPERATIVE DIAGNOSIS:  RIGHT SHOULDER ROTATOR CUFF ARTHROPATHY   POST-OPERATIVE DIAGNOSIS:  RIGHT SHOULDER ROTATOR CUFF ARTHROPATHY   PROCEDURE:  Procedure(s): RIGHT REVERSE TOTAL SHOULDER ARTHROPLASTY (Right) DePuy Delta Xtend  SURGEON:  Surgeon(s) and Role:    * [name], MD - Primary  PHYSICIAN ASSISTANT:   ASSISTANTS: Gerrit Halls PA-C   ANESTHESIA:   regional and general  EBL:  Total I/O In: 800 [I.V.:800] Out: 75 [Blood:75]  BLOOD ADMINISTERED:none  DRAINS: none   LOCAL MEDICATIONS USED:  MARCAINE     SPECIMEN:  No Specimen  DISPOSITION OF SPECIMEN:  N/A  COUNTS:  YES  TOURNIQUET:  * No tourniquets in log *  DICTATION: .Other Dictation: Dictation Number [id number]  PLAN OF CARE: Admit to inpatient   PATIENT DISPOSITION:  PACU - hemodynamically stable.   Delay start of Pharmacological VTE agent (>24hrs) due to surgical blood loss or risk of bleeding: yes

## 2015-08-02 NOTE — Interval H&P Note (Signed)
History and Physical Interval Note:  08/02/2015 9:23 AM  Miranda Thompson  has presented today for surgery, with the diagnosis of RIGHT SHOULDER ROTATOR CUFF ARTHROPATHY   The various methods of treatment have been discussed with the patient and family. After consideration of risks, benefits and other options for treatment, the patient has consented to  Procedure(s): RIGHT REVERSE TOTAL SHOULDER ARTHROPLASTY (Right) as a surgical intervention .  The patient's history has been reviewed, patient examined, no change in status, stable for surgery.  I have reviewed the patient's chart and labs.  Questions were answered to the patient's satisfaction.     Ellington Greenslade,STEVEN R

## 2015-08-02 NOTE — Progress Notes (Signed)
Pt arrived to the unit from pacu at 1330; Pt A&O x4; IV intact and transfusing. Pt oriented to the unit and room; VSS; RUE remains in sling with ice applied; ABD dsg to incision remains, clean, dry and intact. Pt has no sensation to RUE d/t block received during surgery but +2 radial pulse upon assessment. Fall and safety precautions/prevenation education completed with pt. Bed alarm on; call light within reach and will continue to monitor pt closely. Delia Heady RN

## 2015-08-02 NOTE — Anesthesia Procedure Notes (Addendum)
Anesthesia Regional Block:  Interscalene brachial plexus block  Pre-Anesthetic Checklist: ,, timeout performed, Correct Patient, Correct Site, Correct Laterality, Correct Procedure, Correct Position, site marked, Risks and benefits discussed,  Surgical consent,  Pre-op evaluation,  At surgeon's request and post-op pain management  Laterality: Right  Prep: chloraprep       Needles:  Injection technique: Single-shot  Needle Type: Echogenic Stimulator Needle     Needle Length: 5cm 5 cm Needle Gauge: 21 and 21 G    Additional Needles:  Procedures: ultrasound guided (picture in chart) Interscalene brachial plexus block Narrative:  Start time: 08/02/2015 8:58 AM End time: 08/02/2015 9:05 AM Injection made incrementally with aspirations every 5 mL.  Performed by: Personally  Anesthesiologist: Josephine Igo  Additional Notes: Relevant anatomy ID'd with [place]. Incremental 75ml injection with frequent aspiration. Patient tolerated procedure well.   Procedure Name: Intubation Date/Time: 08/02/2015 9:56 AM Performed by: Oletta Lamas Pre-anesthesia Checklist: Patient identified, Emergency Drugs available, Suction available and Patient being monitored Patient Re-evaluated:Patient Re-evaluated prior to inductionOxygen Delivery Method: Circle System Utilized Preoxygenation: Pre-oxygenation with 100% oxygen Intubation Type: IV induction Ventilation: Mask ventilation without difficulty Laryngoscope Size: Mac and 3 Grade View: Grade I Tube type: Oral Number of attempts: 1 Airway Equipment and Method: Stylet Placement Confirmation: ETT inserted through vocal cords under direct vision,  positive ETCO2 and breath sounds checked- equal and bilateral Secured at: 22 cm Tube secured with: Tape Dental Injury: Teeth and Oropharynx as per pre-operative assessment       Right Interscalene Block

## 2015-08-02 NOTE — Transfer of Care (Signed)
Immediate Anesthesia Transfer of Care Note  Patient: Miranda Thompson  Procedure(s) Performed: Procedure(s): RIGHT REVERSE TOTAL SHOULDER ARTHROPLASTY (Right)  Patient Location: PACU  Anesthesia Type:GA combined with regional for post-op pain  Level of Consciousness: awake, alert  and patient cooperative  Airway & Oxygen Therapy: Patient connected to nasal cannula oxygen  Post-op Assessment: Report given to RN  Post vital signs: stable  Last Vitals:  Filed Vitals:   08/02/15 0915 08/02/15 0918  BP: 140/55   Pulse: 102 100  Temp:    Resp: 19 17    Last Pain: There were no vitals filed for this visit.    Patients Stated Pain Goal: 3 ([id number])  Complications: No apparent anesthesia complications

## 2015-08-02 NOTE — Discharge Instructions (Signed)
Ice to the shoulder as much as you can.  Ok to remove the sling and use the arm for light activity, no heavy use or pushing out of a chair  Keep the incision clean and dry and covered for one week, then ok to shower and get it wet  Follow up in the office in two weeks [phone]

## 2015-08-03 ENCOUNTER — Encounter: Payer: Self-pay | Admitting: Pulmonary Disease

## 2015-08-03 LAB — HEMOGLOBIN AND HEMATOCRIT, BLOOD
HCT: 29.6 % — ABNORMAL LOW (ref 36.0–46.0)
Hemoglobin: 9.7 g/dL — ABNORMAL LOW (ref 12.0–15.0)

## 2015-08-03 LAB — BASIC METABOLIC PANEL
Anion gap: 7 (ref 5–15)
BUN: 13 mg/dL (ref 6–20)
CO2: 24 mmol/L (ref 22–32)
Calcium: 7.9 mg/dL — ABNORMAL LOW (ref 8.9–10.3)
Chloride: 97 mmol/L — ABNORMAL LOW (ref 101–111)
Creatinine, Ser: 0.62 mg/dL (ref 0.44–1.00)
GFR calc Af Amer: >60 mL/min (ref >60)
GFR calc non Af Amer: >60 mL/min (ref >60)
Glucose, Bld: 108 mg/dL — ABNORMAL HIGH (ref 65–99)
Potassium: 4.3 mmol/L (ref 3.5–5.1)
Sodium: 128 mmol/L — ABNORMAL LOW (ref 135–145)

## 2015-08-03 NOTE — Progress Notes (Signed)
NURSING PROGRESS NOTE  [name] [id number] Discharge Data: 08/03/2015 2:30 PM Attending Provider: No att. providers found [id number] Miranda Mages, MD     Miranda Thompson to be D/C'd Home per MD order.  Discussed with the patient the After Visit Summary and all questions fully answered. All IV's discontinued with no bleeding noted. All belongings returned to patient for patient to take home. Prescription given to patient  Last Vital Signs:  Blood pressure 111/47, pulse 91, temperature 99.5 F (37.5 C), temperature source Oral, resp. rate 16, height 5' 3.75" (1.619 m), weight 60.782 kg (134 lb), last menstrual period 02/23/1985, SpO2 92 %.  Discharge Medication List   Medication List    TAKE these medications        acetaminophen 500 MG tablet  Commonly known as:  TYLENOL  Take 500 mg by mouth every 6 (six) hours as needed.     ALIGN PO  Take 1 tablet by mouth daily.     Calcium Carb-Cholecalciferol 600-800 MG-UNIT Tabs  Take 1 tablet by mouth daily.     cholecalciferol 1000 units tablet  Commonly known as:  VITAMIN D  Take 1,000 Units by mouth daily.     diltiazem 120 MG 24 hr capsule  Commonly known as:  CARDIZEM CD  Take 1 capsule (120 mg total) by mouth daily.     ferrous sulfate 325 (65 FE) MG tablet  Take 1 tablet (325 mg total) by mouth 3 (three) times daily after meals.     Fluticasone-Salmeterol 250-50 MCG/DOSE Aepb  Commonly known as:  ADVAIR  Inhale 1 puff into the lungs daily.     guaiFENesin 600 MG 12 hr tablet  Commonly known as:  MUCINEX  Take 1,200 mg by mouth 2 (two) times daily.     HYDROcodone-acetaminophen 5-325 MG tablet  Commonly known as:  NORCO  Take 1 tablet by mouth every 6 (six) hours as needed for moderate pain.     losartan 50 MG tablet  Commonly known as:  COZAAR  Take 1 tablet (50 mg total) by mouth daily.     MULTIVITAMIN & MINERAL PO  Take 1 tablet by mouth daily.     YUVAFEM 10 MCG Tabs vaginal tablet  Generic drug:   Estradiol  PLACE 1 TABLET VAGINALLY TWICE A WEEK

## 2015-08-03 NOTE — Progress Notes (Signed)
Subjective: 1 Day Post-Op Procedure(s) (LRB): RIGHT REVERSE TOTAL SHOULDER ARTHROPLASTY (Right) Patient reports pain as well controlled.  Reports a good night. Pt and family report they are ready for d/c. No Cp, sob, or calf pain.  Objective: Vital signs in last 24 hours: Temp:  [97.5 F (36.4 C)-99.5 F (37.5 C)] 99.5 F (37.5 C) (06/10 0502) Pulse Rate:  [74-102] 91 (06/10 0502) Resp:  [14-27] 16 (06/10 0502) BP: (66-152)/(39-61) 111/47 mmHg (06/10 0502) SpO2:  [92 %-100 %] 92 % (06/10 0502)  Intake/Output from previous day: 06/09 0701 - 06/10 0700 In: 1889 [P.O.:489; I.V.:1400] Out: 150 [Urine:75; Blood:75] Intake/Output this shift:     Recent Labs  08/03/15 0211  HGB 9.7*    Recent Labs  08/03/15 0211  HCT 29.6*    Recent Labs  08/03/15 0211  NA 128*  K 4.3  CL 97*  CO2 24  BUN 13  CREATININE 0.62  GLUCOSE 108*  CALCIUM 7.9*   No results for input(s): LABPT, INR in the last 72 hours.  Well nourished. Alert and oriented x3. RRR, Lungs clear, BS x4. Abdomen soft and non tender. Right Calf soft and non tender. Sling in place.  Right shoulder dressing C/D/I. No DVT signs. Compartment soft. No signs of infection.  Right UE grossly neurovascular intact.  Assessment/Plan: 1 Day Post-Op Procedure(s) (LRB): RIGHT REVERSE TOTAL SHOULDER ARTHROPLASTY (Right) D/c home with family F/u with Dr.Norris OT this am Questions encouraged and answered.   Hyponatremia: Monitor PCP aware, recommended f/u with PCP Advance diet  Haru Shaff L 08/03/2015, 8:53 AM

## 2015-08-03 NOTE — Op Note (Signed)
NAMEMarland Kitchen  TOMICKA, STEFANO NO.:  [id number]  MEDICAL RECORD NO.:  [id number]  LOCATION:  5N30C                        FACILITY:  [facility]  PHYSICIAN:  Doran Heater. Veverly Fells, M.D. DATE OF BIRTH:  1933-12-11  DATE OF PROCEDURE:  08/02/2015 DATE OF DISCHARGE:                              OPERATIVE REPORT   PREOPERATIVE DIAGNOSIS:  Right shoulder end-stage arthritis/rotator cuff tear arthropathy.  POSTOPERATIVE DIAGNOSIS:  Right shoulder end-stage arthritis/rotator cuff tear arthropathy.  PROCEDURE PERFORMED:  Right shoulder reverse total shoulder arthroplasty.  ATTENDING SURGEON:  Doran Heater. Veverly Fells, MD.  ASSISTANT:  Judith Part. Chabon, P.A.  ANESTHESIA:  General anesthesia was used plus interscalene block.  ESTIMATED BLOOD LOSS:  Minimal.  FLUID REPLACEMENT:  1200 mL crystalloid.  INSTRUMENT COUNTS:  Correct.  COMPLICATIONS:  There were no complications.  ANTIBIOTICS:  Perioperative antibiotics given.  INDICATIONS:  The patient is an 82 year old female, who presents with a history of worsening right shoulder pain and dysfunction secondary to rotator cuff tear arthropathy.  The patient has failed extensive conservative management for shoulder and presents with progressive pain despite conservative management.  She desires operative treatment to restore function and eliminate pain to the shoulder.  Informed consent obtained.  DESCRIPTION OF PROCEDURE:  After an adequate level of anesthesia achieved, the patient was positioned in the modified beach chair position.  Right shoulder correctly identified and sterilely prepped and draped in usual manner.  Time-out was called.  We entered the shoulder in standard deltopectoral incision.  We started at the coracoid process extending down to the anterior humerus.  Dissection was carried down through the subcutaneous tissues using Bovie identified cephalic vein and took it laterally with the deltoid, pectoralis taken  medially.  The upper conjoint was identified and retracted medially.  We identified a remnant of the subscapularis.  We went ahead and divided that and removed that subperiosteally off the lesser tuberosity.  We tagged it for protection of the axillary nerve.  We released that to hold that axillary nerve back during a dissection.  We then went ahead and extended the shoulder, delivered the shoulder of the wound into the proximal humerus with a 6 mm reamer, reamed up to a size 12.  We then used the intramedullary resection guide to resect the head set on 10 degrees of retroversion.  We then went ahead and removed excess osteophytes.  We then milled for the epi 1 right humerus.  Once we had that milled out and reamed, we then impacted the 12 body with the epi 1 right humerus in place, it was a trial.  We then went ahead and subluxed the humerus posteriorly, did a 360-degree capsular release and capsule removal, carefully protected the axillary nerve.  The glenoid was extremely small.  The bone was quite fragile.  We did find a center point that we could drill with the guide pin and then reamed gently for the metaglene, hardly had to touch that bone as it was quite brittle. Once we had our metaglene reamed, we drilled the central peg hole and packed the metaglene in position, placed a 48 locked superiorly, 30 locked inferiorly at the base of the plate, and then an 18  nonlocked anteriorly, we could not get a screw posteriorly.  We did feel that we had adequate and secure fixation of the metaglene.  We then placed a 38 standard glenoid sphere in place and screwed that home and impacted it. We then reduced the shoulder with a 38+ 3 trial.  We were happy with that soft tissue balancing, had good nice tension on the conjoint.  No gapping with inferior polar sulcus and no gapping with external rotation.  We removed all trial components from the humerus, irrigated thoroughly, used combination of  some bone grafting for a large cystic basically ganglion cyst that was in her upper greater trochanter areas, bone grafted extensively, then we did vacuum-mixed cement and hand packed down in the canal and then introduced the 12 HA coated stem and epi 1 right HA coated metaphysis, set on 0, setting placed in 10 degrees retroversion, that was placed into a cement.  We then placed a 38+ 3 poly, impacted that and then reduced the shoulder.  We were happy again with soft tissue balancing and stability.  We irrigated thoroughly, closed deltopectoral interval with 0 Vicryl suture followed by 2-0 Vicryl for subcutaneous closure and 4-0 Monocryl for skin.  Steri-Strips applied followed by sterile dressing.  The patient tolerated the surgery well.     Doran Heater. Veverly Fells, M.D.     SRN/MEDQ  D:  08/02/2015  T:  08/03/2015  Job:  [id number]

## 2015-08-03 NOTE — Progress Notes (Signed)
Patient ID: Miranda Thompson, female   DOB: Sep 11, 1933, 82 y.o.   MRN: [id number]  Subjective:    Patient ID: Miranda Thompson, female    DOB: 1934/01/11, 82 y.o.   MRN: [id number]  HPI 82 y/o WF here for a follow up visit... she has mult med problems as noted below...   ~  SEE PREV EPIC NOTES FOR OLDER DATA >>    LABS 3/14:  FLP- at goals on diet alone;  Chems- wnl;  CBC- wnl;  TSH=2.37;  VitD=31;  UA- clear...  SPUTUM 4/14 >> Pseudomonas sensitive to all antibiotics   LABS 9/14:  CBC- ok w/ Hg=13.7, WBC=6.4;  Fe=91 (25%sat)... BMet wasn't done...   CXR 3/15 showed stable film w/ scarring LUL, no change and surg clips left breast & axilla  EKG 3/15 showed NSR w/ PACs and atrial bigem, 1st degree AVB, otherw NAD...   2DEcho 3/15 showed norm LV size & function w/ EF=55-60%, norm wall motion, valves ok w/ sl thickening of leaflets- mildMR, modTR,; mild RV dil but norm RVF & PAsys=31  LABS 3/15:  FLP- ok on diet alone;  Chems- wnl;  CBC- wnl;  Fe=69 (19%sat);  VitD=49;  TSH=4.33... UA is clear, wnl...   ~  November 08, 2013:  8moROV & Miranda Thompson had an exac 6/15- seen by TP, CXR stable (NAD), treated w/ Levaquin/ Mucinex & improved, but she stopped her Advair & now notes sl incr SOB;  PFT w/ FEV1=1.22 (61%) & she is rec to restart her inhaler;  Notes chr cough w/ green-gray phlegm from her severe bronchiectasis (last pos sputum was 4/14 +Pseudomonas- pansensitive);  EXAM shows bilat rhonchi, no consolidation, and she is stable on her regular regimen and vigorous chest physiotherapy regimen to aide sputum production...     She had f/u Miranda Thompson 4/15> hx palpit, bigeminy on EKG, improved on Aten25; no changes made...    She continues to follow up w/ Miranda Thompson> right knee pain, wears brace & gets shots periodically, his notes are reviewed...  We reviewed prob list, meds, xrays and labs> see below for updates >> OK 2015 Flu vaccine today...   PFT 9/15 showed FVC= 2.08 (77%), FEV1= 1.22 (61%), %1sec=  59%, mid-flows= 31% predicted... c/w GOLD Stage2 COPD, pt GroupB...  Ambulatory O2 Test>  O2sat on RA at rest= 96% w/ pulse=73/min;  Ambulated 3 laps w/ lowest O2sat= 96% w/ pulse=94/min.... PLAN>> continue same Advair250Bid, Mucinex 1200Bid, Fluids, vigorous home chest PT & postural drainage, stay as active as poss...  ~  May 09, 2014:  653moOV & Miranda Thompson reports a good interval- no intercurrent infections, breathing is about the same but she notes some sl incr DOE assoc w/ decr exercise due to knee arthritis; she sees Miranda Thompson and as had several shots but wonders about TKR- given the names for Miranda Thompson & Miranda Thompson as contacts... We reviewed the following medical problems during today's office visit >>     Bronchiectasis & HxPneumonia> on Advair250Bid (not taking regularly) & GFN 1200Bid; PFT 9/15 w/ GOLD stage2-B COPD; mod cough, grey colored sput, intermit hemoptysis, denies f/c/s; last pos culture was 4/14= Pseudomonas, pan-sens & treated w/ Cipro at that time; she does regular ChestPT...    Palpitations & PACs> she had PACs & atrial bigeminy; improved on Atenolol25; seen by Miranda Thompson/15 & 12/15- no AFib or MAT & no changes made...    CHOL> on diet alone; FLP 3/15 showed TChol 172, TG 50, HDL 56, LDL 106  GI- Divertics> asked to take Align, Metamucil, etc; she had diverticulitis flair 2012 x2 & treated by Miranda Thompson w/ Cipro & Flagyl    GU- HxUTI w/ pelvic pressure, back pain, freq&urgency> felt to have a chronic cystitis by Miranda Thompson & she understands asymptomatic bactiuria...    Hx Breast Cancer> she had surg 1994 w/ lumpectomy & LN dissection followed by XRT & Chemorx; she was released by Miranda Thompson in 2003; followed by Miranda Thompson- seen 12/15 w/ neg exam & mammogram 3/16- OK...    DJD, LBP, Osteopenia> on Tylenol, Estradiol topically per Gyn, calcium, MVI, VitD1000; she walks >64m/d for exercise; followed by Miranda Thompson regularly; c/o incr right knee pain & she requested Ortho names  for poss TKR (Alusio/ O[facility]van Dame; [facility]=> now seeing Miranda Thompson...    HxEczema> she uses CoQ10,Borage Oil & black current; she likes herbal remedies...  We reviewed prob list, meds, xrays and labs> see below for updates >>   CXR 3/16 is stable- chronic changes on left, clips from prev left breast surg, DDD in Tspine...  LABS 3/16:  FLP- wnl on diet alone;  Chems- wnl;  CBC- wnl;  TSH=4.99...  ~  November 06, 2014:  688moOV & medical f/u visit>     From the pulmonary standpoint she is stable w/ her severe bronchiectasis but only using her Advair250 once daily & only taking the Mucinex 60038md; asked to incr the Advair250Bid & the Mucinex600-2Bid w/ fluids and maintain a vigorous chest PT & postural drainage routine to clear her airways...    She had f/u CARDS- Miranda Thompson 10/25/14> hx palpit w/ atrial bigeminy on office EKG; prev tried Aten but stopped due to low endurance, 2DEcho 04/2013 showed norm EF=55-60%, no wall motion abn, mildMR, PAsys=31; he added CARDIZEM-CD 240m18mbut pt states that she didn't take it stating that she "only wants low-dose meds" => therefore rec to try CARDIZEN-CD 120mg48m.    She also notes that her BP is "all over the place" but BP monitor was apparently ok; she read her EKG on my chart & had a lot of questions; currently not taking any BP med & BP today= 140/80; I told her that the Cardizem would help regulate her blood pressure as well as help control her palpitations...     She had a routine f/u colonoscopy 08/08/14 by Miranda Thompson> +FamHx colon cancer in father; 2 sessile polyps removed, 3-5mm s63m, mod diverticulosis, Path= tubular adenomas...    She saw Miranda Thompson for her knees w/ grade4 arthritis on right, cortisone shots lasted several months, uses compression sleeve & Aleve/ Tramadol; he referred her to DrOlinBrunsonotes "bone on bone" & rec bilat TKRs...  EXAM shows Afeb, VSS, O2sat=96% on RA;  HEENT- neg, mallampati1;  Chest- diffuse  bilat rhonchi/ congestion, w/o wheezing or signs of consolidation;  Heart- RR gr1/6 SEM w/o r/g;  Abd- soft, neg;  Ext- w/o c/c/e...  EKG 06/19/14 showed NSR, rate78, poor R progression V1-3, otherw neg/NAD...  We reviewed prev CXR, LABS, etc... IMP/PLAN>>  She reminded me that her daughter didn't want her on Atenolol, & she only wants "low-dose" meds therefore try CARDIZEM-CD 120mg/d62meeds to incr the Advair250 to Bid & the Mucinex 600mg to47md w/ fluids + continue the postural drainage/ chest PT regimen... OK 2016 flu vaccine today.  ~  December 18, 2014:  6wk ROV & recheck> Allysson Caitlann taking the CardizemCD120 since last OV & watching her home BP checks very  carefully; pressure readings are somewhat labile- 110/70 range to 160/100+ range using her home cuff; she remains asymptomatic w/o HA, CP, palpit, dizzy, edema, etc; exercise is limited by her knees and she tells me she is ready for TKR surg by Miranda Thompson; we decided to add LOSARTAN77m/d for her BP control & she will continue to monitor it at home... EXAM shows Afeb, VSS, O2sat=96% on RA;  BP=132/74; HEENT- neg, mallampati1;  Chest- diffuse bilat rhonchi w/o wheezing or signs of consolidation;  Heart- RR gr1/6 SEM w/o r/g;  Abd- soft, neg;  Ext- w/o c/c/e... PLAN>>  She will continue all current meds and ADD LOSARTAN 5636md; watch BPs at home & call for questions; she asks that I send this note to DrAshippunor surg from the medical standpoint...  ~  March 20, 2015:  36m19moV & Fallon had her right TKR by Miranda Thompson 01/21/15, s/p PT, still sl stiff but exercising on bike & walking some- no complications and she is very pleased;  BP is improved w/ the addition of Losartan50 & measures 142/62 today... We reviewed the following medical problems during today's office visit >>     Bronchiectasis & HxPneumonia> on Advair250Bid (not taking regularly) & GFN 1200Bid; PFT 9/15 w/ GOLD stage2-B COPD; mod cough, grey colored sput, intermit hemoptysis,  denies f/c/s; last pos culture was 4/14= Pseudomonas, pan-sens & treated w/ Cipro at that time; she does regular ChestPT which really helps...    Palpitations & PACs> she had PACs & atrial bigeminy; improved on Atenolol25; followed by DrNCherly Hensenchanged to CardizemCD120 (she refused 240 dose), last seen 03/18/15- no AFib or MAT & no changes made...    CHOL> on diet alone; FLP 3/16 showed TChol 163, TG 70, HDL 53, LDL 96    GI- Divertics> asked to take Align, Metamucil, etc; she had diverticulitis flair in 2012 x2 & treated by Miranda Thompson w/ Cipro & Flagyl, ok since then.    GU- HxUTI w/ pelvic pressure, back pain, freq&urgency> felt to have a chronic cystitis by Miranda Thompson & she understands asymptomatic bactiuria...    Hx Breast Cancer> she had surg 1994 w/ lumpectomy & LN dissection followed by XRT & Chemorx; [facility] followed by GynHollenbergeen 12/15 w/ neg exam & mammogram 3/16- OK...    DJD, LBP, Osteopenia> on Tylenol, Estradiol topically per Gyn, calcium, MVI, VitD1000; she walks >1mi37mfor exercise; followed by Miranda Thompson & OlinAlvan Damep right TKR 12/2014; [facility]...     HxEczema> she uses CoQ10,Borage Oil & black current; she likes herbal remedies...  EXAM shows Afeb, VSS, O2sat=96% on RA;  HEENT- neg, mallampati1;  Chest- rhonchi L>R, w/o w/r/consolidation;  Heart- RR w/o m/r/g;  Abd- soft, nontender, neg;  Ext- s/p R-TKR, w/o c/c/e;  Neuro- intact...  LABS 03/20/15> we discussed FASTING labs on return... IMP/PLAN>>  Mult medical problems as listed- MariKelcistable & did well w/ the knee replacement surg; continue same meds and her vigorous chest PT regimen; we plan recheck in 3-553mo 353moasting blood work.  ~  August 01, 2015:  4-53mo R3553mo MarilySalayaer right TKR 12/2014 by Miranda Thompson & is now sched for a right reverse total shoulder arthroplasty by DrNorris tomorrow!  She tells me that she still needs to have her left knee  done;  She notes that her breathing is OK w/ mild chr cough, min sput production, & DOE w/o change (  she walks, rides bike, etc);  She recently ret for FLabs;  We reviewed the following medical problems during today's office visit >>     Bronchiectasis & HxPneumonia> on Advair250Bid & GFN 1200Bid; she is supposed to be doing Flutter treatments and postural drainage as well; PFT 9/15 w/ GOLD stage2-B COPD; mod cough, grey colored sput, intermit hemoptysis, denies f/c/s; last pos culture was 4/14= Pseudomonas, pan-sens & treated w/ Cipro at that time; the regular ChestPT which really helps...    Palpitations & PACs> she had PACs & atrial bigeminy; improved on Atenolol25 (but c/o low endurance); followed by Cherly Hensen & changed to CardizemCD120 (she refused 240 dose) + Losar50, last seen 03/18/15- no AFib or MAT & no changes made...    CHOL> on diet alone; FLP 6/17 showed TChol 174, TG 64, HDL 62, LDL 100    GI- Divertics> asked to take Align, Metamucil, etc; she had diverticulitis flair in 2012 x2 & treated by Miranda Thompson w/ Cipro & Flagyl, ok since then.    GU- HxUTI w/ pelvic pressure, back pain, freq&urgency> felt to have a chronic cystitis by Miranda Thompson & she understands asymptomatic bactiuria...    Hx Breast Cancer> she had surg 1994 w/ lumpectomy & LN dissection followed by XRT & Chemorx; she was [facility]; followed by [place]- seen 12/15 w/ neg exam & mammogram 3/16- OK...    DJD, LBP, Osteopenia> on Tylenol, Estradiol topically per Gyn, calcium, MVI, VitD1000; she walks >65m/d for exercise; followed by Miranda Thompson & OAlvan Dame s/p right TKR 12/2014; [facility]...     HxEczema> she uses CoQ10,Borage Oil & black current; she likes herbal remedies...  EXAM shows Afeb, VSS, O2sat=98% on RA;  HEENT- neg, mallampati1;  Chest- rhonchi L>R, w/o w/r/consolidation;  Heart- RR w/o m/r/g;  Abd- soft, nontender, neg;  Ext- s/p R-TKR, decr ROM right  shoulder, w/o c/c/e;  Neuro- intact...  FLABS 07/29/15>  FLP- all parameters at goals on diet alone;  Chems- wnl x Na=132;  CBC- ok w/ Hg=12.2, MCV=97;  TSH=2.61.. IMP/PLAN>>  MArletais stable w/ her severe bronchiectasis & reminded to be diligent w/ her meds and Chest PT treatments;  she is sched for the next of her planned orthopedic procedures;  We plan ROV in 662mosooner if needed prn.           Problem List:     ALLERGIC RHINITIS (ICD-477.9) - she uses OTC antihistamines Prn.  BRONCHIECTASIS (ICD-494.0) & Hx of PNEUMONIA (ICD-486) - on ADVAIR 250 Bid, & MUCINEX 1-2Bid...  Long hx severe bronchiectasis, granulomatous lung dis, and pneumonia (initially Dx 1968 by DrFrazier, subseq bx= noncaseating gran dz & chr inflam)... her last bronchoscopy was 4/04 revealing some mucus plugging... CTChest 4/04 showed marked bronchiectasis in lingular, LLL, RML, and mucus plugging... CT repeated 6/06- similar. ~  Sputum cultures 5/08 grew Serratia (R to amox/ceph, S to all others), branching part acid fast bacilli resembling nocardia, yeast c/w candida... ~  CXR 1/09 chr lung dis, no change from prev w/ left mid lung zone as the worst area... ~  CXR 3/10 w/ chr lung changes and NAD suspected... ~  Sputum 8/10 showed NTF only & AFB smears & cult all neg... ~  CXR 3/11 showed chr lung dis, no acute changes, stable... ~  CXR 3/12 showed chronic changes, scarring, NAD...Marland Kitchen~  CXR 3/13 showed essentially no change> normal heart size, prom pulm arts, bilat interstitial prominence &  nodularity, bronchiectasis, NAD, surg clips from prev left breast ca surg. ~  9/13: she presents w/ an acute exac w/ cough, thick sput, congestion, fever, SOB, & chest discomfort; CXR shows worse left mid zone opacity; We decided to treat w/ empiric Levaquin x10d pending cult report==> ret w/ resist Pseudomonas, but pt states better/ improved on the Levaquin. ~  Sputum C&S 4/14 showed Pseudomonas sens to Cefepime, Tressie Ellis, Cipro, Levaquin,  etc ~  3/15 & 9/15: on Advair250Bid (not taking regularly) & GFN 1200Bid; mod cough, grey colored sput, intermit hemoptysis, denies f/c/s; last pos culture was 4/14= Pseudomonas, pan-sens & treated w/ Cipro. ~  CXR 3/15 showed stable film w/ scarring LUL, no change and surg clips left breast & axilla... ~  PFTs 9/15 showed FVC= 2.08 (77%), FEV1= 1.22 (61%), %1sec= 59%, mid-flows= 31% predicted... c/w GOLD Stage2 COPD, pt GroupB... ~  3/16: on Advair250Bid & GFN 1200Bid (asked to take regularly); PFT 9/15 w/ GOLD stage2-B COPD; mod cough, grey colored sput, intermit hemoptysis, denies f/c/s; last pos culture was 4/14= Pseudomonas, pan-sens & treated w/ Cipro at that time; she does regular ChestPT... ~  CXR 3/16 is stable- chronic changes on left, clips from prev left breast surg, DDD in Tspine ~  9/16: she is reminded to use the Advair250Bid & the Mucinex600-2Bid w/ fluids and maintain a vigorous chest PT & postural drainage regimen...  PALPITATIONS >> followed by Barton Fanny on Atenolol25 & improved... ~  2DEcho 3/15 showed norm LV size & function w/ EF=55-60% and no regional wall motion abn, mild MR, mild TR, PAsys=52mHg... ~  9/16: she reminds me that daughter does not want her on Atenolol; Miranda Thompson ordered CardizemCD240 but she "only wants low dose meds"; therefore try CardizemCD120/d... ~  10/16: pt returns w/ extensive BP records on her CardizemCD120/d; BP today= 132/74 but BP at home up to 160/100+ and we decided to add LOSATAN '50mg'$ /d to her regimen...   HYPERCHOLESTEROLEMIA, BORDERLINE (ICD-272.4) - on diet alone... ~  FLP 9/10 showed TChol 174, TG 64, HDL 49, LDL 112 ~  FLP 9/11 showed TChol 175, TG 61, HDL 48, LDL 115 ~  FLP 3/13 showed TChol 176, TG 69, HDL 64, LDL 98 ~  FLP 9/13 showed TChol 169, TG 65, HDL 48, LDL 108 ~  FLP 3/14 showed TChol 157, TG 54, HDL 46, LDL 100 ~  FLP 3/15 showed TChol 172, TG 50, HDL 56, LDL 106 ~  FLP 3/16 showed TChol 163, TG 70, HDL 53, LDL 96  Hx of  ADENOCARCINOMA, BREAST (ICD-174.9) - [facility] (stage II 1.0  cm tumor), w/ post op XRT and chemotherapy... [facility] (released in 2003). ~  routine Mammogram 2/10 was neg- f/u planned 157yr~  f/u mammogram 2/11 = neg, NAD, f/u planned 1y57yr  f/u mammogram 2/12 = neg, NAD, f/u 70yr26yr f/u mammogram 2/13 = neg, NAD... ~Marland Kitchen f/u mammogram 2/14 = neg, scat parenchymal densities... ~  [facility]... ~[facility]....  DIVERTICULOSIS OF COLON (ICD-562.10) - colonoscopy 3/05 by Miranda Thompson showed divertics only... she has +fam hx colon ca w/ colon check [id number]/u colonoscopy 5/11 showed +divertics, no polyps... ~  3/12:  Hx, exam & CTAbd c/w diverticulitis; treated w/ Cipro/ Flagyl & resolved;  Advised Metamucil, Miralax, etc. ~  11/12:  She had  f/u Miranda Thompson after another bout of diverticulitis; he notes regular BMs, good appetite, stable weight; he wanted her to STOP Naprosyn, ok to use Tylenol/ Tramadol, rec hi fiber, saw the [place] movie. ~  She had a routine f/u colonoscopy 08/08/14 by Miranda Thompson> +FamHx colon cancer in father; 2 sessile polyps removed, 3-49m size, mod diverticulosis, Path= tubular adenomas...  DEGENERATIVE JOINT DISEASE (ICD-715.90) - see Ortho eval 2010 by DrDaldorf... she takes Glucosamine & Herbal supplements- milk thistle/ tumeric which she says really helps... ~  4/14: she saw DrKarlFields for SportsMed> c/o bilat knee pain, known arthritis, uses body helix knee (compression) sleeves & Tramadol prn; takes tart red cherry oil daily & he rec devil's claw, tumeric, & curcumin... ~  3/15: she continues to f/u w/ Miranda Thompson for knee shots periodically... ~  3/16: she is requesting name of Ortho for pTushka.. ~  She is managed by Miranda Thompson and Miranda Thompson => told she needs TKRs & we will send medical  clearance...  LOW BACK PAIN SYNDROME (ICD-724.2) - [facility] showed herniated disc L3-4 w/ mod spinal stenosis and lat recess stenosis bilat...   ROTATOR CUFF SYNDROME, RIGHT (ICD-726.10) - [facility].  OSTEOPENIA (ICD-733.90) - [facility], norm hip, osteopenic forearm (-2.0)... followed by GYN = Miranda Thompson who is treating a low Vit D level... she takes calcium, Vitamins, & Vit D 1000u daily from [place].. ~  labs 9/10 on 50K weekly showed Vit D level = 42... rec> change to 1000 u daily. ~  labs 9/11 on 1000 u daily showed Vit D level = 34... rec> continue daily supplement. ~  Labs 3/13 showed Vit D level = 35... Continue supplements. ~  Labs 3/14 showed Vit D level = 31 ~  Labs 3/15 showed Vit D level = 49... Continue supplements  Hx of ANEMIA (ICD-285.9) -  resolved... ~  labs 9/10 showed Hg= 13.6 ~  labs 9/11 showed Hg= 13.2 ~  Labs 3/13 showed Hg= 13.4 ~  Labs 3/14 showed Hg= 13.3 ~  Labs 3/15 showed Hg= 13.4 ~  Labs 3/16 showed Hg= 13.1  ECZEMA - she has hx of eczema on her hands and has used CoQ10 and Borage Oil as rec by an hAdministratorin PRamey.. recently [facility] to [facility] instead of these other supplements & rash is much improved...   Past Surgical History  Procedure Laterality Date  . Lung biopsy  1968    TSurg---Dr. FMare Ferrari . Left breast lumpectomy and ln dissection  06/1992    Dr. NLucia Gaskins . Tonsillectomy    . Colonoscopy    . Lung biopsy  2012    dr nLenna Gilford . Total knee arthroplasty Right 01/21/2015    Procedure: TOTAL RIGHT KNEE ARTHROPLASTY;  Surgeon: MParalee Cancel MD;  Location: [facility];  Service: Orthopedics;  Laterality: Right;  . Reverse total shoulder arthroplasty Right 08/02/2015    Outpatient Encounter Prescriptions as of 08/01/2015  Medication Sig  . acetaminophen (TYLENOL) 500 MG tablet Take 500 mg by mouth every 6 (six) hours as needed.  . Calcium  Carb-Cholecalciferol 600-800 MG-UNIT TABS Take 1 tablet by mouth daily.  . cholecalciferol (VITAMIN D) 1000 UNITS tablet Take 1,000 Units by mouth daily.  .Marland Kitchendiltiazem (CARDIZEM CD) 120 MG 24 hr capsule Take 1 capsule (120 mg total) by mouth daily.  . ferrous sulfate 325 (65 FE) MG tablet Take 1 tablet (  325 mg total) by mouth 3 (three) times daily after meals.  . Fluticasone-Salmeterol (ADVAIR) 250-50 MCG/DOSE AEPB Inhale 1 puff into the lungs daily.  Marland Kitchen guaiFENesin (MUCINEX) 600 MG 12 hr tablet Take 1,200 mg by mouth 2 (two) times daily.  Marland Kitchen losartan (COZAAR) 50 MG tablet Take 1 tablet (50 mg total) by mouth daily.  . Multiple Vitamins-Minerals (MULTIVITAMIN & MINERAL PO) Take 1 tablet by mouth daily.    . Probiotic Product (ALIGN PO) Take 1 tablet by mouth daily.   Merril Abbe 10 MCG TABS vaginal tablet PLACE 1 TABLET VAGINALLY TWICE A WEEK  . [DISCONTINUED] Glucosamine-Chondroitin (GLUCOSAMINE CHONDR COMPLEX) 500-400 MG CAPS Take 1 tablet by mouth daily.    No facility-administered encounter medications on file as of 08/01/2015.    No Known Allergies   Current Medications, Allergies, Past Medical History, Past Surgical History, Family History, and Social History were reviewed in [employer] record.     Review of Systems         See HPI - all other systems neg except as noted...  The patient complains of dyspnea on exertion and abdominal pain.  The patient denies anorexia, fever, weight loss, weight gain, vision loss, decreased hearing, hoarseness, chest pain, syncope, peripheral edema, prolonged cough, headaches, hemoptysis, melena, hematochezia, severe indigestion/heartburn, hematuria, incontinence, muscle weakness, suspicious skin lesions, transient blindness, difficulty walking, depression, unusual weight change, abnormal bleeding, enlarged lymph nodes, and angioedema.     Objective:   Physical Exam     WD, WN, 82 y/o WF in NAD... GENERAL:  Alert & oriented;  pleasant & cooperative... HEENT:  [place]/AT, EOM-wnl, PERRLA, EACs-clear, TMs-wnl, NOSE-clear, THROAT-clear & wnl. NECK:  Supple w/ fairROM; no JVD; normal carotid impulses w/o bruits; no thyromegaly or nodules palpated; no lymphadenopathy. CHEST:  scat bilat rhonchi without wheezing, rales or signs of consolidation... HEART:  Regular Rhythm; without murmurs/ rubs/ or gallops heard... ABDOMEN:  Soft w/ mild tender LLQ; normal bowel sounds; no organomegaly or masses detected. EXT: without deformities or arthritic changes; no varicose veins/ venous insuffic/ or edema. NEURO:  CN's intact;  no focal neuro deficits... DERM:  No lesions noted; no rash etc...  RADIOLOGY DATA:  Reviewed in the EPIC EMR & discussed w/ the patient...  LABORATORY DATA:  Reviewed in the EPIC EMR & discussed w/ the patient...    Assessment & Plan:    COPD/ Bronchiectasis/ Hx pneumonia>  On Advair250, Mucinex2Bid, Fluids & vigorous Chest PT/ postural drainage... 9/13> treating acute exac w/ Levaquin x10d; Cult grew a resist Pseudomonas but she reports clinically improved, therefore continue present Rx & follow...  Repeat Sput C&S 4/14 showed pansens Pseudomonas... Baseline CXRs showed CXR 3/15 & 3/16 showed scarring LUL, no change, and surg clips left breast & axilla... PFTs 9/15 showed FVC= 2.08 (77%), FEV1= 1.22 (61%), %1sec= 59%, mid-flows= 31% predicted... c/w GOLD Stage2 COPD, pt GroupB... REMINDED TO TAKE MEDS REGULARLY & MAINTAIN A VIGOROUS REGIMEN OF CHEST PT & POSTURAL DRAINAGE...   HBP & palpit (PACs)>  followed by Cherly Hensen; on CardizemCD120 & she does not want BBlockers, & only wants "low-dose" meds... 10/16>   BP at home variable w/ 160/100+ episodes so we decided to add low dose LOSARTAN '50mg'$ /d... 1/17>   BP much better w/ the Losar50 added- continue same...   CHOL>  On diet alone & f/u FLP is at goal...  Breast Cancer>  S[facility]p [facility] & oncology f/u by DrNeijstrom, then [facility] 2003 &  released; yearly  mammograms & monthly self exams were wnl...  Divertics>  She had acute diverticulitis 3/12 & 10/12> resolved w/ diet adjust & Cipro/Flagyl, now back to baseline & advised re- Metamucil, Fiberall, etc; she saw Miranda Thompson 11/12.  DJD/ LBP>  Ortho is Miranda Thompson & Miranda Thompson; she takes Glucosamine & Herbs;  11/16>   s/p right TKR 12/2014 by Miranda Thompson 6/17>   sched for right total shoulder replacement by DrNorris  Osteopenia/ Vit D defic>  On Calcium, MVI, Vit D supplement; followed by GYN= Miranda Thompson...  Other medical issues as noted...    Patient's Medications  New Prescriptions   HYDROCODONE-ACETAMINOPHEN (NORCO) 5-325 MG TABLET    Take 1 tablet by mouth every 6 (six) hours as needed for moderate pain.  Previous Medications   ACETAMINOPHEN (TYLENOL) 500 MG TABLET    Take 500 mg by mouth every 6 (six) hours as needed.   CALCIUM CARB-CHOLECALCIFEROL 600-800 MG-UNIT TABS    Take 1 tablet by mouth daily.   CHOLECALCIFEROL (VITAMIN D) 1000 UNITS TABLET    Take 1,000 Units by mouth daily.   DILTIAZEM (CARDIZEM CD) 120 MG 24 HR CAPSULE    Take 1 capsule (120 mg total) by mouth daily.   FERROUS SULFATE 325 (65 FE) MG TABLET    Take 1 tablet (325 mg total) by mouth 3 (three) times daily after meals.   FLUTICASONE-SALMETEROL (ADVAIR) 250-50 MCG/DOSE AEPB    Inhale 1 puff into the lungs daily.   GUAIFENESIN (MUCINEX) 600 MG 12 HR TABLET    Take 1,200 mg by mouth 2 (two) times daily.   LOSARTAN (COZAAR) 50 MG TABLET    Take 1 tablet (50 mg total) by mouth daily.   MULTIPLE VITAMINS-MINERALS (MULTIVITAMIN & MINERAL PO)    Take 1 tablet by mouth daily.     PROBIOTIC PRODUCT (ALIGN PO)    Take 1 tablet by mouth daily.    YUVAFEM 10 MCG TABS VAGINAL TABLET    PLACE 1 TABLET VAGINALLY TWICE A WEEK  Modified Medications   No medications on file  Discontinued Medications   GLUCOSAMINE-CHONDROITIN (GLUCOSAMINE CHONDR COMPLEX) 500-400 MG CAPS    Take 1 tablet by mouth daily.

## 2015-08-03 NOTE — Progress Notes (Addendum)
Occupational Therapy Treatment Patient Details Name: Miranda Thompson MRN: [id number] DOB: 1933/08/09 Today's Date: 08/03/2015    History of present illness 82 y.o. female s/p R reverse TSA.  Pt with PMHx of low back pain, anemia, L breast CA, fibromyalgia, spinal stenosis, HTN, A-fib, COPD, R TKA (12/2014), DJD, osteopenia, allergic rhinitis, bronchiectasis with acute exacerabation.     OT comments  Performed Right UE exercises in session and reviewed ADL information. Feel pt is safe to d/c home from OT standpoint.   Follow Up Recommendations  No OT follow up;Supervision - Intermittent    Equipment Recommendations  None recommended by OT    Recommendations for Other Services      Precautions / Restrictions Precautions Precautions: Fall;Shoulder Type of Shoulder Precautions: active protocol: AROM/PROM FF to 90 degrees, AROM/PROM external rotation to 30 degrees, and AROM/PROM abduction to 60 degrees of RUE; no pushing, pulling, lifting with RUE (can use to hold light items) Shoulder Interventions: Shoulder sling/immobilizer;For comfort (and sleep) Precaution Booklet Issued:  (given earlier) Required Braces or Orthoses: Sling Restrictions Weight Bearing Restrictions: Yes RUE Weight Bearing: Non weight bearing       Mobility Bed Mobility Overal bed mobility: Modified Independent              Transfers Overall transfer level: Needs assistance   Transfers: Sit to/from Stand Sit to Stand: Supervision            Balance No LOB in session.                  ADL Overall ADL's : Needs assistance/impaired                       Toilet Transfer: Supervision/safety;Ambulation (sit to stand from bed and chair)           Functional mobility during ADLs: Supervision/safety General ADL Comments: Recommended sitting for LB bathing and someone be with her for bathing and for tub transfer. Stepped back in session for brief period of time to give them  additional handout and asked if pt able to manage toilet hygiene and pt simulated and able to reach with left arm with no difficulty.      Vision                     Perception     Praxis      Cognition  Awake/Alert Behavior During Therapy: WFL for tasks assessed/performed Overall Cognitive Status: Within Functional Limits for tasks assessed       Memory: Decreased short-term memory                 Exercises/Shoulder instructions Other Exercises Other Exercises: performed Rt elbow flexion/extension, right wrist flexion/extension, and moved Rt digits. Other Exercises: performed Rt shoulder forward flexion, abduction, and external rotation while supine in bed.  Donning/doffing shirt without moving shoulder:  (pt allowed to move arm; pt able to verbalize technique) Method for sponge bathing under operated UE:  (pt able to verbalize) Donning/doffing sling/immobilizer: Minimal assistance (OT assisted in doffing;daughter assisted donning) Correct positioning of sling/immobilizer: Minimal assistance (OT adjusted slightly after caregiver helped don sling) ROM for elbow, wrist and digits of operated UE: Minimal assistance Sling wearing schedule (on at all times/off for ADL's):  (reviewed for comfort and sleep) Proper positioning of operated UE when showering:  (pt described technique of washing under arm) Positioning of UE while sleeping: Caregiver independent with task (caregiver able to verbalize)  General Comments      Pertinent Vitals/ Pain       Pain Assessment: 0-10 Pain Score: 5  Pain Location: RUE  Pain Descriptors / Indicators: Aching Pain Intervention(s): Monitored during session  Home Living Family/patient expects to be discharged to:: Private residence Living Arrangements: Spouse/significant other Available Help at Discharge: Family;Available 24 hours/day Type of Home: House Home Access: Stairs to enter [employer] of Steps: 6 Entrance  Stairs-Rails: Right;Left Home Layout: Multi-level Alternate Level Stairs-Number of Steps: 13 Alternate Level Stairs-Rails: Right;Left Bathroom Shower/Tub: Teacher, early years/pre: Standard     Home Equipment: Toilet riser;Tub bench          Prior Functioning/Environment Level of Independence: Independent            Frequency Min 2X/week     Progress Toward Goals  OT Goals(current goals can now be found in the care plan section)  Progress towards OT goals: Progressing toward goals (adequate for d/c)  Acute Rehab OT Goals Patient Stated Goal: not stated OT Goal Formulation: With patient Time For Goal Achievement: 08/10/15 Potential to Achieve Goals: Good ADL Goals Additional ADL Goal #1: Pt will independently perform HEP for RUE.  Additional ADL Goal #2: Pt/caregiver will be Mod I at managing bathing/dressing tasks while maintaining shoulder precautions.  Plan Discharge plan remains appropriate    Co-evaluation                 End of Session Equipment Utilized During Treatment: Other (comment) (sling)   Activity Tolerance Patient tolerated treatment well   Patient Left Other (comment) (up with family in room)   Nurse Communication          Time: [id number] OT Time Calculation (min): 19 min  Charges: OT General Charges $OT Visit: 1 Procedure OT Treatments $Therapeutic Exercise: 8-22 mins  Benito Mccreedy OTR/L [id number] 08/03/2015, 12:29 PM

## 2015-08-03 NOTE — Evaluation (Signed)
Physical Therapy Evaluation/Discharge Patient Details Name: Miranda Thompson MRN: [id number] DOB: 08-31-1933 Today's Date: 08/03/2015   History of Present Illness  82 y.o. female admitted to [facility] on 08/02/15 for  R reverse TSA.  Pt with significant PMHx of low back pain, anemia, L breast CA, fibromyalgia, spinal stenosis, HTN, A-fib, COPD, R TKA (12/2014).    Clinical Impression  Pt is POD #1 and expectedly a bit wobbly on her feet.  She has excellent support from family and I believe the more she mobilizes the stronger she will get.  She was safe on stairs and would like to continue to sleep in her upstairs bedroom which I believe is ok as long as she is supervised on the stairs.  Family and pt verbalized understanding of this precaution.  PT to sign off.      Follow Up Recommendations Supervision for mobility/OOB    Equipment Recommendations  None recommended by PT    Recommendations for Other Services   NA    Precautions / Restrictions Precautions Required Braces or Orthoses: Sling Restrictions RUE Weight Bearing: Non weight bearing      Mobility  Bed Mobility               General bed mobility comments: pt OOB in chair  Transfers Overall transfer level: Needs assistance   Transfers: Sit to/from Stand Sit to Stand: Supervision         General transfer comment: supervision for safety  Ambulation/Gait Ambulation/Gait assistance: Min guard;Supervision Ambulation Distance (Feet): 300 Feet Assistive device: 1 person hand held assist Gait Pattern/deviations: Step-through pattern;Staggering left;Staggering right     General Gait Details: very mildly staggering gait pattern min guard assist at first up to supervision with incerased distance.   Stairs Stairs: Yes Stairs assistance: Supervision Stair Management: One rail Right;One rail Left;Step to pattern;Forwards Number of Stairs: 2 General stair comments: verbal cues to make sure pt has someone with her when she  does the stairs and goes one step at a time for safety.         Balance Overall balance assessment: Needs assistance Sitting-balance support: Feet supported;No upper extremity supported Sitting balance-Leahy Scale: Good     Standing balance support: Single extremity supported;No upper extremity supported Standing balance-Leahy Scale: Fair                               Pertinent Vitals/Pain Pain Assessment: Faces Faces Pain Scale: Hurts little more Pain Location: right shoulder Pain Descriptors / Indicators: Grimacing;Guarding Pain Intervention(s): Limited activity within patient's tolerance;Monitored during session;Repositioned    Home Living Family/patient expects to be discharged to:: Private residence Living Arrangements: Spouse/significant other Available Help at Discharge: Family;Available 24 hours/day Type of Home: House Home Access: Stairs to enter Entrance Stairs-Rails: Right Entrance Stairs-Number of Steps: 6 Home Layout: Able to live on main level with bedroom/bathroom;Multi-level Home Equipment: Toilet riser;Shower seat;Tub bench      Prior Function Level of Independence: Independent                  Extremity/Trunk Assessment   Upper Extremity Assessment: Defer to OT evaluation           Lower Extremity Assessment: RLE deficits/detail RLE Deficits / Details: R leg with h/o R TKA and ER a bit more than L LE    Cervical / Trunk Assessment: Normal  Communication   Communication: No difficulties  Cognition Arousal/Alertness: Awake/alert Behavior During Therapy:  WFL for tasks assessed/performed Overall Cognitive Status: Within Functional Limits for tasks assessed                      General Comments General comments (skin integrity, edema, etc.): Pt reported some mild lightheadedness with gait, but it did not interfere with her ability to complete my assessment and did not increase when walking or moving around.            Assessment/Plan    PT Assessment All further PT needs can be met in the next venue of care  PT Diagnosis Difficulty walking;Abnormality of gait;Generalized weakness;Acute pain   PT Problem List Decreased strength;Decreased range of motion;Decreased activity tolerance;Decreased balance;Pain  PT Treatment Interventions     PT Goals (Current goals can be found in the Care Plan section) Acute Rehab PT Goals Patient Stated Goal: to return home later today PT Goal Formulation: All assessment and education complete, DC therapy     End of Session Equipment Utilized During Treatment: Other (comment) (R arm sling) Activity Tolerance: Patient tolerated treatment well Patient left: in chair;with call bell/phone within reach;with family/visitor present           Time: 8127-5170 PT Time Calculation (min) (ACUTE ONLY): 14 min   Charges:   PT Evaluation $PT Eval Moderate Complexity: 1 Procedure          Klaudia Beirne B. [place], [place], DPT [phone]   08/03/2015, 10:58 AM

## 2015-08-03 NOTE — Evaluation (Signed)
Occupational Therapy Evaluation Patient Details Name: Miranda Thompson MRN: [id number] DOB: January 08, 1934 Today's Date: 08/03/2015    History of Present Illness 82 y.o. female s/p R reverse TSA.  Pt with PMHx of low back pain, anemia, L breast CA, fibromyalgia, spinal stenosis, HTN, A-fib, COPD, R TKA (12/2014), DJD, osteopenia, allergic rhinitis, bronchiectasis with acute exacerabation.     Clinical Impression   Pt s/p above. Pt independent with ADLs, PTA. Feel pt will benefit from acute OT to increase independence and address RUE prior to d/c. Do not feel pt will need follow up OT upon d/c.    Follow Up Recommendations  No OT follow up;Supervision - Intermittent    Equipment Recommendations  None recommended by OT    Recommendations for Other Services       Precautions / Restrictions Precautions Precautions: Fall;Shoulder Type of Shoulder Precautions: active protocol: AROM/PROM FF to 90 degrees, AROM/PROM external rotation to 30 degrees, and AROM/PROM abduction to 60 degrees of RUE; no pushing, pulling, lifting with RUE (can use to hold light items) Shoulder Interventions: Shoulder sling/immobilizer;For comfort (and sleep) Precaution Booklet Issued: Yes (comment) Required Braces or Orthoses: Sling Restrictions Weight Bearing Restrictions: Yes RUE Weight Bearing: Non weight bearing      Mobility Bed Mobility               General bed mobility comments: not assessed  Transfers Overall transfer level: Needs assistance   Transfers: Sit to/from Stand Sit to Stand: Supervision            Balance No LOB in session-balance not formally assessed                           ADL Overall ADL's : Needs assistance/impaired                     Lower Body Dressing: Minimal assistance;Sit to/from stand   Toilet Transfer: Min guard;Ambulation (sit to stand from chair)           Functional mobility during ADLs:  (Min guard-ambulation and  Supervision-sit to stand)       Vision     Perception     Praxis      Pertinent Vitals/Pain Pain Assessment: 0-10 Pain Score:  (3-4) Pain Location: back of Rt shoulder Pain Descriptors / Indicators: Aching Pain Intervention(s): Monitored during session     Hand Dominance Right   Extremity/Trunk Assessment Upper Extremity Assessment Upper Extremity Assessment: RUE deficits/detail RUE Deficits / Details: weakness in Rt shoulder; elbow, wrist, hand ROM WFL   Lower Extremity Assessment Lower Extremity Assessment: Defer to PT evaluation RLE Deficits / Details: R leg with h/o R TKA and ER a bit more than L LE   Cervical / Trunk Assessment Cervical / Trunk Assessment: Normal   Communication Communication Communication: No difficulties   Cognition Arousal/Alertness: Awake/alert Behavior During Therapy: WFL for tasks assessed/performed Overall Cognitive Status: Within Functional Limits for tasks assessed                     General Comments       Exercises Exercises: Other exercises;Shoulder Other Exercises Other Exercises: performed Rt elbow flexion/extension, right wrist flexion/extension, and moved Rt digits.   Shoulder Instructions Shoulder Instructions Donning/doffing shirt without moving shoulder:  (educated on technique and clothing-pt allowed to move RUE) Method for sponge bathing under operated UE:  (educated) Donning/doffing sling/immobilizer: Maximal assistance (educated and assisted) Correct positioning of  sling/immobilizer: Maximal assistance (educated and assisted) ROM for elbow, wrist and digits of operated UE: Supervision/safety Sling wearing schedule (on at all times/off for ADL's):  (educated for comfort and sleep) Proper positioning of operated UE when showering:  (educated) Positioning of UE while sleeping:  (educated)    Home Living Family/patient expects to be discharged to:: Private residence Living Arrangements: Spouse/significant  other Available Help at Discharge: Family;Available 24 hours/day Type of Home: House Home Access: Stairs to enter [employer] of Steps: 6 Entrance Stairs-Rails: Right;Left Home Layout: Multi-level Alternate Level Stairs-Number of Steps: 13 Alternate Level Stairs-Rails: Right;Left Bathroom Shower/Tub: Teacher, early years/pre: Standard     Home Equipment: Toilet riser;Tub bench          Prior Functioning/Environment Level of Independence: Independent             OT Diagnosis: Acute pain   OT Problem List: Decreased strength;Decreased range of motion;Decreased activity tolerance;Pain;Impaired UE functional use;Decreased knowledge of precautions;Decreased knowledge of use of DME or AE   OT Treatment/Interventions: Self-care/ADL training;DME and/or AE instruction;Therapeutic exercise;Therapeutic activities;Patient/family education;Balance training    OT Goals(Current goals can be found in the care plan section) Acute Rehab OT Goals Patient Stated Goal: not stated OT Goal Formulation: With patient Time For Goal Achievement: 08/10/15 Potential to Achieve Goals: Good ADL Goals Additional ADL Goal #1: Pt will independently perform HEP for RUE.  Additional ADL Goal #2: Pt/caregiver will be Mod I at managing bathing/dressing tasks while maintaining shoulder precautions.  OT Frequency: Min 2X/week   Barriers to D/C:            Co-evaluation              End of Session Equipment Utilized During Treatment: Other (comment) (sling)  Activity Tolerance: Patient tolerated treatment well Patient left: in chair;with family/visitor present   Time: [id number] OT Time Calculation (min): 22 min Charges:  OT General Charges $OT Visit: 1 Procedure OT Evaluation $OT Eval Moderate Complexity: 1 Procedure G-CodesBenito Mccreedy OTR/L [id number] 08/03/2015, 11:08 AM

## 2015-08-05 ENCOUNTER — Encounter (HOSPITAL_COMMUNITY): Payer: Self-pay | Admitting: Orthopedic Surgery

## 2015-08-10 NOTE — Discharge Summary (Signed)
Physician Discharge Summary  Patient ID: Miranda Thompson MRN: [id number] DOB/AGE: 09-04-1933 82 y.o.  Admit date: 08/02/2015 Discharge date: 08/10/2015  Admission Diagnoses:  Discharge Diagnoses:  Active Problems:   S/P shoulder replacement   Discharged Condition: good  Hospital Course:  Miranda Thompson is a 82 y.o. who was admitted to [facility]. They were brought to the operating room on 08/02/2015 and underwent Procedure(s): RIGHT REVERSE TOTAL SHOULDER ARTHROPLASTY.  Patient tolerated the procedure well and was later transferred to the recovery room and then to the orthopaedic floor for postoperative care.  They were given PO and IV analgesics for pain control following their surgery.  They were given 24 hours of postoperative antibiotics of  Anti-infectives    Start     Dose/Rate Route Frequency Ordered Stop   08/03/15 0000  ceFAZolin (ANCEF) IVPB 2g/100 mL premix     2 g 200 mL/hr over 30 Minutes Intravenous Every 6 hours 08/02/15 1805 08/03/15 0544   08/02/15 2000  ceFAZolin (ANCEF) IVPB 2g/100 mL premix  Status:  Discontinued     2 g 200 mL/hr over 30 Minutes Intravenous Every 6 hours 08/02/15 1803 08/02/15 1805   08/02/15 1600  ceFAZolin (ANCEF) IVPB 2g/100 mL premix  Status:  Discontinued     2 g 200 mL/hr over 30 Minutes Intravenous Every 6 hours 08/02/15 1221 08/02/15 1803   08/02/15 0807  ceFAZolin (ANCEF) IVPB 2g/100 mL premix  Status:  Discontinued     2 g 200 mL/hr over 30 Minutes Intravenous On call to O.R. 08/02/15 [id number] 08/02/15 0810   08/02/15 0807  ceFAZolin (ANCEF) IVPB 2g/100 mL premix     2 g 200 mL/hr over 30 Minutes Intravenous On call to O.R. 08/02/15 0807 08/02/15 1035   08/02/15 0806  ceFAZolin (ANCEF) 2-4 GM/100ML-% IVPB    Comments:  Merryl Hacker   : cabinet override      08/02/15 0806 08/02/15 2014      Discharge planning in place.  Patient had a good night on the evening of surgery and started to get up OOB with therapy on day one.  Dressing was with normal limits.  The patient had progressed with therapy and meeting their goals. Patient was seen in rounds and was ready to go home.  Consults: n/a  Significant Diagnostic Studies: n/a  Treatments: n/a  Discharge Exam: Blood pressure 111/47, pulse 91, temperature 99.5 F (37.5 C), temperature source Oral, resp. rate 16, height 5' 3.75" (1.619 m), weight 60.782 kg (134 lb), last menstrual period 02/23/1985, SpO2 92 %. Well nourished. Alert and oriented x3. RRR, Lungs clear, BS x4. Abdomen soft and non tender.Shoulder dressing C/D/I. No DVT signs. Compartment soft. No signs of infection.  UE grossly neurovascular intact.  Disposition: 01-Home or Self Care     Medication List    TAKE these medications        acetaminophen 500 MG tablet  Commonly known as:  TYLENOL  Take 500 mg by mouth every 6 (six) hours as needed.     ALIGN PO  Take 1 tablet by mouth daily.     Calcium Carb-Cholecalciferol 600-800 MG-UNIT Tabs  Take 1 tablet by mouth daily.     cholecalciferol 1000 units tablet  Commonly known as:  VITAMIN D  Take 1,000 Units by mouth daily.     diltiazem 120 MG 24 hr capsule  Commonly known as:  CARDIZEM CD  Take 1 capsule (120 mg total) by mouth daily.  ferrous sulfate 325 (65 FE) MG tablet  Take 1 tablet (325 mg total) by mouth 3 (three) times daily after meals.     Fluticasone-Salmeterol 250-50 MCG/DOSE Aepb  Commonly known as:  ADVAIR  Inhale 1 puff into the lungs daily.     guaiFENesin 600 MG 12 hr tablet  Commonly known as:  MUCINEX  Take 1,200 mg by mouth 2 (two) times daily.     HYDROcodone-acetaminophen 5-325 MG tablet  Commonly known as:  NORCO  Take 1 tablet by mouth every 6 (six) hours as needed for moderate pain.     losartan 50 MG tablet  Commonly known as:  COZAAR  Take 1 tablet (50 mg total) by mouth daily.     MULTIVITAMIN & MINERAL PO  Take 1 tablet by mouth daily.     YUVAFEM 10 MCG Tabs vaginal tablet  Generic  drug:  Estradiol  PLACE 1 TABLET VAGINALLY TWICE A WEEK           Follow-up Information    Follow up with NORRIS,STEVEN R, MD. Call in 2 weeks.   Specialty:  Orthopedic Surgery   Why:  [phone]   Contact information:   [address] [place] [address] [phone]       Signed: Lajean Manes 08/10/2015, 8:55 PM

## 2015-08-11 ENCOUNTER — Encounter: Payer: Self-pay | Admitting: Pulmonary Disease

## 2015-08-14 ENCOUNTER — Ambulatory Visit (INDEPENDENT_AMBULATORY_CARE_PROVIDER_SITE_OTHER): Payer: Medicare Other | Admitting: Pulmonary Disease

## 2015-08-14 ENCOUNTER — Encounter: Payer: Self-pay | Admitting: Pulmonary Disease

## 2015-08-14 ENCOUNTER — Other Ambulatory Visit: Payer: Medicare Other

## 2015-08-14 VITALS — BP 124/62 | HR 89 | Temp 98.0°F | Ht 63.75 in | Wt 134.2 lb

## 2015-08-14 DIAGNOSIS — M159 Polyosteoarthritis, unspecified: Secondary | ICD-10-CM

## 2015-08-14 DIAGNOSIS — J471 Bronchiectasis with (acute) exacerbation: Secondary | ICD-10-CM

## 2015-08-14 DIAGNOSIS — Z96611 Presence of right artificial shoulder joint: Secondary | ICD-10-CM

## 2015-08-14 DIAGNOSIS — M8949 Other hypertrophic osteoarthropathy, multiple sites: Secondary | ICD-10-CM

## 2015-08-14 DIAGNOSIS — M150 Primary generalized (osteo)arthritis: Secondary | ICD-10-CM | POA: Diagnosis not present

## 2015-08-14 MED ORDER — CIPROFLOXACIN HCL 500 MG PO TABS: 500.0000 mg | ORAL_TABLET | Freq: Two times a day (BID) | ORAL | Status: DC

## 2015-08-14 MED ORDER — IPRATROPIUM-ALBUTEROL 0.5-2.5 (3) MG/3ML IN SOLN: 3.0000 mL | Freq: Three times a day (TID) | RESPIRATORY_TRACT | Status: DC

## 2015-08-14 NOTE — Patient Instructions (Signed)
Today we updated your med list in our EPIC system...    Continue your current medications the same...  We decided to prescribe a HOME NEBULIZER w/ Duoneb medication (Albuterol + Ipratropium in one vial) Three times daily...   We will start a 10d course of CIPRO 500mg  Twice daily...    We sent the sputum specimen for culture to be sure the germ is not resistent)...  Keep up your good work w/ the physical therapy...  Call for any questions or if we can be of service in any way.Marland KitchenMarland Kitchen

## 2015-08-14 NOTE — Progress Notes (Signed)
Patient ID: Miranda Thompson, female   DOB: Sep 11, 1933, 82 y.o.   MRN: [id number]  Subjective:    Patient ID: Miranda Thompson, female    DOB: 1934/01/11, 82 y.o.   MRN: [id number]  HPI 82 y/o WF here for a follow up visit... she has mult med problems as noted below...   ~  SEE PREV EPIC NOTES FOR OLDER DATA >>    LABS 3/14:  FLP- at goals on diet alone;  Chems- wnl;  CBC- wnl;  TSH=2.37;  VitD=31;  UA- clear...  SPUTUM 4/14 >> Pseudomonas sensitive to all antibiotics   LABS 9/14:  CBC- ok w/ Hg=13.7, WBC=6.4;  Fe=91 (25%sat)... BMet wasn't done...   CXR 3/15 showed stable film w/ scarring LUL, no change and surg clips left breast & axilla  EKG 3/15 showed NSR w/ PACs and atrial bigem, 1st degree AVB, otherw NAD...   2DEcho 3/15 showed norm LV size & function w/ EF=55-60%, norm wall motion, valves ok w/ sl thickening of leaflets- mildMR, modTR,; mild RV dil but norm RVF & PAsys=31  LABS 3/15:  FLP- ok on diet alone;  Chems- wnl;  CBC- wnl;  Fe=69 (19%sat);  VitD=49;  TSH=4.33... UA is clear, wnl...   ~  November 08, 2013:  8moROV & Miranda Thompson had an exac 6/15- seen by TP, CXR stable (NAD), treated w/ Levaquin/ Mucinex & improved, but she stopped her Advair & now notes sl incr SOB;  PFT w/ FEV1=1.22 (61%) & she is rec to restart her inhaler;  Notes chr cough w/ green-gray phlegm from her severe bronchiectasis (last pos sputum was 4/14 +Pseudomonas- pansensitive);  EXAM shows bilat rhonchi, no consolidation, and she is stable on her regular regimen and vigorous chest physiotherapy regimen to aide sputum production...     She had f/u DrNishan 4/15> hx palpit, bigeminy on EKG, improved on Aten25; no changes made...    She continues to follow up w/ DrFields> right knee pain, wears brace & gets shots periodically, his notes are reviewed...  We reviewed prob list, meds, xrays and labs> see below for updates >> OK 2015 Flu vaccine today...   PFT 9/15 showed FVC= 2.08 (77%), FEV1= 1.22 (61%), %1sec=  59%, mid-flows= 31% predicted... c/w GOLD Stage2 COPD, pt GroupB...  Ambulatory O2 Test>  O2sat on RA at rest= 96% w/ pulse=73/min;  Ambulated 3 laps w/ lowest O2sat= 96% w/ pulse=94/min.... PLAN>> continue same Advair250Bid, Mucinex 1200Bid, Fluids, vigorous home chest PT & postural drainage, stay as active as poss...  ~  May 09, 2014:  653moOV & Miranda Thompson reports a good interval- no intercurrent infections, breathing is about the same but she notes some sl incr DOE assoc w/ decr exercise due to knee arthritis; she sees drFields and as had several shots but wonders about TKR- given the names for DrAlusio & DrOlin as contacts... We reviewed the following medical problems during today's office visit >>     Bronchiectasis & HxPneumonia> on Advair250Bid (not taking regularly) & GFN 1200Bid; PFT 9/15 w/ GOLD stage2-B COPD; mod cough, grey colored sput, intermit hemoptysis, denies f/c/s; last pos culture was 4/14= Pseudomonas, pan-sens & treated w/ Cipro at that time; she does regular ChestPT...    Palpitations & PACs> she had PACs & atrial bigeminy; improved on Atenolol25; [facility]- no AFib or MAT & no changes made...    CHOL> on diet alone; FLP 3/15 showed TChol 172, TG 50, HDL 56, LDL 106  GI- Divertics> asked to take Align, Metamucil, etc; she had diverticulitis flair 2012 x2 & treated by DrPatterson w/ Cipro & Flagyl    GU- HxUTI w/ pelvic pressure, back pain, freq&urgency> felt to have a chronic cystitis by DrEskridge & she understands asymptomatic bactiuria...    Hx Breast Cancer> she had surg 1994 w/ lumpectomy & LN dissection followed by XRT & Chemorx; she was released by [facility] in 2003; followed by [place]- seen 12/15 w/ neg exam & mammogram 3/16- OK...    DJD, LBP, Osteopenia> on Tylenol, Estradiol topically per Gyn, calcium, MVI, VitD1000; she walks >64m/d for exercise; followed by DrFields regularly; c/o incr right knee pain & she requested Ortho names  for poss TKR (Alusio/ OAlvan Dame; [facility] DrRomaine=> now seeing DrSMiller...    HxEczema> she uses CoQ10,Borage Oil & black current; she likes herbal remedies...  We reviewed prob list, meds, xrays and labs> see below for updates >>   CXR 3/16 is stable- chronic changes on left, clips from prev left breast surg, DDD in Tspine...  LABS 3/16:  FLP- wnl on diet alone;  Chems- wnl;  CBC- wnl;  TSH=4.99...  ~  November 06, 2014:  688moOV & medical f/u visit>     From the pulmonary standpoint she is stable w/ her severe bronchiectasis but only using her Advair250 once daily & only taking the Mucinex 60038md; asked to incr the Advair250Bid & the Mucinex600-2Bid w/ fluids and maintain a vigorous chest PT & postural drainage routine to clear her airways...    She had f/u [facility]- DrNishan 10/25/14> hx palpit w/ atrial bigeminy on office EKG; prev tried Aten but stopped due to low endurance, 2DEcho 04/2013 showed norm EF=55-60%, no wall motion abn, mildMR, PAsys=31; he added CARDIZEM-CD 240m18mbut pt states that she didn't take it stating that she "only wants low-dose meds" => therefore rec to try CARDIZEN-CD 120mg48m.    She also notes that her BP is "all over the place" but BP monitor was apparently ok; she read her EKG on my chart & had a lot of questions; currently not taking any BP med & BP today= 140/80; I told her that the Cardizem would help regulate her blood pressure as well as help control her palpitations...     She had a routine f/u colonoscopy 08/08/14 by DrPyrtle> +FamHx colon cancer in father; 2 sessile polyps removed, 3-5mm s63m, mod diverticulosis, Path= tubular adenomas...    She saw DrFields for her knees w/ grade4 arthritis on right, cortisone shots lasted several months, uses compression sleeve & Aleve/ Tramadol; he referred her to DrOlinBrunsonotes "bone on bone" & rec bilat TKRs...  EXAM shows Afeb, VSS, O2sat=96% on RA;  HEENT- neg, mallampati1;  Chest- diffuse  bilat rhonchi/ congestion, w/o wheezing or signs of consolidation;  Heart- RR gr1/6 SEM w/o r/g;  Abd- soft, neg;  Ext- w/o c/c/e...  EKG 06/19/14 showed NSR, rate78, poor R progression V1-3, otherw neg/NAD...  We reviewed prev CXR, LABS, etc... IMP/PLAN>>  She reminded me that her daughter didn't want her on Atenolol, & she only wants "low-dose" meds therefore try CARDIZEM-CD 120mg/d62meeds to incr the Advair250 to Bid & the Mucinex 600mg to47md w/ fluids + continue the postural drainage/ chest PT regimen... OK 2016 flu vaccine today.  ~  December 18, 2014:  6wk ROV & recheck> Allysson Caitlann taking the CardizemCD120 since last OV & watching her home BP checks very  carefully; pressure readings are somewhat labile- 110/70 range to 160/100+ range using her home cuff; she remains asymptomatic w/o HA, CP, palpit, dizzy, edema, etc; exercise is limited by her knees and she tells me she is ready for TKR surg by DrOlin; we decided to add LOSARTAN77m/d for her BP control & she will continue to monitor it at home... EXAM shows Afeb, VSS, O2sat=96% on RA;  BP=132/74; HEENT- neg, mallampati1;  Chest- diffuse bilat rhonchi w/o wheezing or signs of consolidation;  Heart- RR gr1/6 SEM w/o r/g;  Abd- soft, neg;  Ext- w/o c/c/e... PLAN>>  She will continue all current meds and ADD LOSARTAN 5636md; watch BPs at home & call for questions; she asks that I send this note to DrAshippunor surg from the medical standpoint...  ~  March 20, 2015:  36m19moV & Fallon had her right TKR by DrOlin 01/21/15, s/p PT, still sl stiff but exercising on bike & walking some- no complications and she is very pleased;  BP is improved w/ the addition of Losartan50 & measures 142/62 today... We reviewed the following medical problems during today's office visit >>     Bronchiectasis & HxPneumonia> on Advair250Bid (not taking regularly) & GFN 1200Bid; PFT 9/15 w/ GOLD stage2-B COPD; mod cough, grey colored sput, intermit hemoptysis,  denies f/c/s; last pos culture was 4/14= Pseudomonas, pan-sens & treated w/ Cipro at that time; she does regular ChestPT which really helps...    Palpitations & PACs> she had PACs & atrial bigeminy; improved on Atenolol25; followed by DrNCherly Hensenchanged to CardizemCD120 (she refused 240 dose), last seen 03/18/15- no AFib or MAT & no changes made...    CHOL> on diet alone; FLP 3/16 showed TChol 163, TG 70, HDL 53, LDL 96    GI- Divertics> asked to take Align, Metamucil, etc; she had diverticulitis flair in 2012 x2 & treated by DrPatterson w/ Cipro & Flagyl, ok since then.    GU- HxUTI w/ pelvic pressure, back pain, freq&urgency> felt to have a chronic cystitis by DrEskridge & she understands asymptomatic bactiuria...    Hx Breast Cancer> she had surg 1994 w/ lumpectomy & LN dissection followed by XRT & Chemorx; [facility]; followed by GynHollenbergeen 12/15 w/ neg exam & mammogram 3/16- OK...    DJD, LBP, Osteopenia> on Tylenol, Estradiol topically per Gyn, calcium, MVI, VitD1000; she walks >1mi37mfor exercise; followed by DrFields & OlinAlvan Damep right TKR 12/2014; [facility]...     HxEczema> she uses CoQ10,Borage Oil & black current; she likes herbal remedies...  EXAM shows Afeb, VSS, O2sat=96% on RA;  HEENT- neg, mallampati1;  Chest- rhonchi L>R, w/o w/r/consolidation;  Heart- RR w/o m/r/g;  Abd- soft, nontender, neg;  Ext- s/p R-TKR, w/o c/c/e;  Neuro- intact...  LABS 03/20/15> we discussed FASTING labs on return... IMP/PLAN>>  Mult medical problems as listed- MariKelcistable & did well w/ the knee replacement surg; continue same meds and her vigorous chest PT regimen; we plan recheck in 3-553mo 353moasting blood work.  ~  August 01, 2015:  4-53mo R3553mo MarilySalayaer right TKR 12/2014 by DrOlin & is now sched for a right reverse total shoulder arthroplasty by DrNorris tomorrow!  She tells me that she still needs to have her left knee  done;  She notes that her breathing is OK w/ mild chr cough, min sput production, & DOE w/o change (  she walks, rides bike, etc);  She recently ret for FLabs;  We reviewed the following medical problems during today's office visit >>     Bronchiectasis & HxPneumonia> on Advair250Bid & GFN 1200Bid; she is supposed to be doing Flutter treatments and postural drainage as well; PFT 9/15 w/ GOLD stage2-B COPD; mod cough, grey colored sput, intermit hemoptysis, denies f/c/s; last pos culture was 4/14= Pseudomonas, pan-sens & treated w/ Cipro at that time; the regular ChestPT which really helps...    Palpitations & PACs> she had PACs & atrial bigeminy; improved on Atenolol25 (but c/o low endurance); followed by Cherly Hensen & changed to CardizemCD120 (she refused 240 dose) + Losar50, last seen 03/18/15- no AFib or MAT & no changes made...    CHOL> on diet alone; FLP 6/17 showed TChol 174, TG 64, HDL 62, LDL 100    GI- Divertics> asked to take Align, Metamucil, etc; she had diverticulitis flair in 2012 x2 & treated by DrPatterson w/ Cipro & Flagyl, ok since then.    GU- HxUTI w/ pelvic pressure, back pain, freq&urgency> felt to have a chronic cystitis by DrEskridge & she understands asymptomatic bactiuria...    Hx Breast Cancer> surg 1994 w/ lumpectomy & LN dissection followed by XRT & Chemorx; she was [facility]; followed by [place]- seen 12/15 w/ neg exam & mammogram 3/16- OK...    DJD, LBP, Osteopenia> on Tylenol, Estradiol topically per Gyn, calcium, MVI, VitD1000; she walks >37m/d for exercise; followed by DrFields & OAlvan Dame s/p right TKR 12/2014; [facility] her Gyn...     HxEczema> she uses CoQ10, Borage Oil & black current; she likes herbal remedies...  EXAM shows Afeb, VSS, O2sat=98% on RA;  HEENT- neg, mallampati1;  Chest- rhonchi L>R, w/o w/r/consolidation;  Heart- RR w/o m/r/g;  Abd- soft, nontender, neg;  Ext- s/p R-TKR, decr ROM right shoulder, w/o  c/c/e;  Neuro- intact...  FLABS 07/29/15>  FLP- all parameters at goals on diet alone;  Chems- wnl x Na=132;  CBC- ok w/ Hg=12.2, MCV=97;  TSH=2.61.. IMP/PLAN>>  MSophyais stable w/ her severe bronchiectasis & reminded to be diligent w/ her meds and Chest PT treatments;  she is sched for the next of her planned orthopedic procedures;  We plan ROV in 640mosooner if needed prn.  ~  August 14, 2015:  2wk ROV & add-on appt requested for increased cough, green sput production, and difficulty managing the congestion;  She tells me that she had her right shoulder replacement 2 wks ago & since then has been unable to do her postural drainage for her bronchiectasis resulting in incr cough, gren/gray mucus, incr chest congestion, low grade temp & dyspnea; she says her chest feels full;  Home meds include> ADVAIR250Bid, & Mucinex600-2Bid. But she also does a vigorous chest PT regimen w/ flutter valve treatments and postural drainage...     EXAM shows Afeb, VSS, O2sat=97% on RA;  HEENT- neg, mallampati1;  Chest- congested, rhonchi L>R, w/o w/r/consolidation;  Heart- RR w/o m/r/g;  Ext- s/p R-TKR, s/p R shoulder surg/ sling,..   Sput C&S>  Culture grew PSEUDOMONAS (again) & it remains sens to CIPRO...  IMP/PLAN>>  Pt given Duoneb treatment here in office w/ mod thick green sputum=> sent for C&S which grew Pseudomonas sens to Cipro; we treated her w/ Cipro500Bid & NEB w/ Duoneb Tid regularly; she knows to return to her postural drainage treatments as soon as her shoulder recuperation will allow...Marland KitchenMarland Kitchen  Problem List:     ALLERGIC RHINITIS (ICD-477.9) - she uses OTC antihistamines Prn.  BRONCHIECTASIS (ICD-494.0) & Hx of PNEUMONIA (ICD-486) - on ADVAIR 250 Bid, & MUCINEX 1-2Bid...  Long hx severe bronchiectasis, granulomatous lung dis, and pneumonia (initially Dx 1968 by DrFrazier, subseq bx= noncaseating gran dz & chr inflam)... her last bronchoscopy was 4/04 revealing some mucus plugging... CTChest 4/04 showed  marked bronchiectasis in lingular, LLL, RML, and mucus plugging... CT repeated 6/06- similar. ~  Sputum cultures 5/08 grew Serratia (R to amox/ceph, S to all others), branching part acid fast bacilli resembling nocardia, yeast c/w candida... ~  CXR 1/09 chr lung dis, no change from prev w/ left mid lung zone as the worst area... ~  CXR 3/10 w/ chr lung changes and NAD suspected... ~  Sputum 8/10 showed NTF only & AFB smears & cult all neg... ~  CXR 3/11 showed chr lung dis, no acute changes, stable... ~  CXR 3/12 showed chronic changes, scarring, NAD.Marland Kitchen. ~  CXR 3/13 showed essentially no change> normal heart size, prom pulm arts, bilat interstitial prominence & nodularity, bronchiectasis, NAD, surg clips from prev left breast ca surg. ~  9/13: she presents w/ an acute exac w/ cough, thick sput, congestion, fever, SOB, & chest discomfort; CXR shows worse left mid zone opacity; We decided to treat w/ empiric Levaquin x10d pending cult report==> ret w/ resist Pseudomonas, but pt states better/ improved on the Levaquin. ~  Sputum C&S 4/14 showed Pseudomonas sens to Cefepime, Tressie Ellis, Cipro, Levaquin, etc ~  3/15 & 9/15: on Advair250Bid (not taking regularly) & GFN 1200Bid; mod cough, grey colored sput, intermit hemoptysis, denies f/c/s; last pos culture was 4/14= Pseudomonas, pan-sens & treated w/ Cipro. ~  CXR 3/15 showed stable film w/ scarring LUL, no change and surg clips left breast & axilla... ~  PFTs 9/15 showed FVC= 2.08 (77%), FEV1= 1.22 (61%), %1sec= 59%, mid-flows= 31% predicted... c/w GOLD Stage2 COPD, pt GroupB... ~  3/16: on Advair250Bid & GFN 1200Bid (asked to take regularly); PFT 9/15 w/ GOLD stage2-B COPD; mod cough, grey colored sput, intermit hemoptysis, denies f/c/s; last pos culture was 4/14= Pseudomonas, pan-sens & treated w/ Cipro at that time; she does regular ChestPT... ~  CXR 3/16 is stable- chronic changes on left, clips from prev left breast surg, DDD in Tspine ~  9/16: she is  reminded to use the Advair250Bid & the Mucinex600-2Bid w/ fluids and maintain a vigorous chest PT & postural drainage regimen...  PALPITATIONS >> followed by Barton Fanny on Atenolol25 & improved... ~  2DEcho 3/15 showed norm LV size & function w/ EF=55-60% and no regional wall motion abn, mild MR, mild TR, PAsys=65mHg... ~  9/16: she reminds me that daughter does not want her on Atenolol; DrNishan ordered CardizemCD240 but she "only wants low dose meds"; therefore try CardizemCD120/d... ~  10/16: pt returns w/ extensive BP records on her CardizemCD120/d; BP today= 132/74 but BP at home up to 160/100+ and we decided to add LOSATAN 57md to her regimen...   HYPERCHOLESTEROLEMIA, BORDERLINE (ICD-272.4) - on diet alone... ~  FLP 9/10 showed TChol 174, TG 64, HDL 49, LDL 112 ~  FLP 9/11 showed TChol 175, TG 61, HDL 48, LDL 115 ~  FLP 3/13 showed TChol 176, TG 69, HDL 64, LDL 98 ~  FLP 9/13 showed TChol 169, TG 65, HDL 48, LDL 108 ~  FLP 3/14 showed TChol 157, TG 54, HDL 46, LDL 100 ~  FLP 3/15 showed TChol 172,  TG 50, HDL 56, LDL 106 ~  FLP 3/16 showed TChol 163, TG 70, HDL 53, LDL 96  Hx of ADENOCARCINOMA, BREAST (ICD-174.9) - S/P surgery 5/94 [facility] w/ wide excision L breast lesion and LN dissection (stage II 1.0  cm tumor), w/ post op XRT and chemotherapy... she was followed by [facility] for oncology... then DrLivesay (released in 2003). ~  routine Mammogram 2/10 was neg- f/u planned 54yr ~  f/u mammogram 2/11 = neg, NAD, f/u planned 158yr~  f/u mammogram 2/12 = neg, NAD, f/u 1y17yr  f/u mammogram 2/13 = neg, NAD... Marland Kitchen  f/u mammogram 2/14 = neg, scat parenchymal densities... ~  [facility]... Marland Kitchen  [facility]....  DIVERTICULOSIS OF COLON (ICD-562.10) - colonoscopy 3/05 by DrPatterson showed divertics only... she has +fam hx colon ca w/ colon check [id number] f/u colonoscopy 5/11 showed +divertics, no polyps... ~  3/12:  Hx, exam & CTAbd  c/w diverticulitis; treated w/ Cipro/ Flagyl & resolved;  Advised Metamucil, Miralax, etc. ~  11/12:  She had f/u DrPatterson after another bout of diverticulitis; he notes regular BMs, good appetite, stable weight; he wanted her to STOP Naprosyn, ok to use Tylenol/ Tramadol, rec hi fiber, [place] the [place]. ~  She had a routine f/u colonoscopy 08/08/14 by DrPyrtle> +FamHx colon cancer in father; 2 sessile polyps removed, 3-5mm 56me, mod diverticulosis, Path= tubular adenomas...  DEGENERATIVE JOINT DISEASE (ICD-715.90) - see Ortho eval 2010 by DrDaldorf... she takes Glucosamine & Herbal supplements- milk thistle/ tumeric which she says really helps... ~  4/14: she saw DrKarlFields for SportsMed> c/o bilat knee pain, known arthritis, uses body helix knee (compression) sleeves & Tramadol prn; takes tart red cherry oil daily & he rec devil's claw, tumeric, & curcumin... ~  3/15: she continues to f/u w/ drFields for knee shots periodically... ~  3/16: she is requesting name of Ortho for poss [place]  She is managed by DrFields and DrOlin => told she needs TKRs & we will send medical clearance...  LOW BACK PAIN SYNDROME (ICD-724.2) - [facility] showed herniated disc L3-4 w/ mod spinal stenosis and lat recess stenosis bilat...   ROTATOR CUFF SYNDROME, RIGHT (ICD-726.10) - [facility]/ [facility].  OSTEOPENIA (ICD-733.90) - [facility], norm hip, osteopenic forearm (-2.0)... followed by GYN = DrRomaine who is treating a low Vit D level... she takes calcium, Vitamins, & Vit D 1000u daily from DrRomGreenview  labs 9/10 on 50K weekly showed Vit D level = 42... rec> change to 1000 u daily. ~  labs 9/11 on 1000 u daily showed Vit D level = 34... rec> continue daily supplement. ~  Labs 3/13 showed Vit D level = 35... Continue supplements. ~  Labs 3/14 showed Vit D level = 31 ~  Labs 3/15 showed Vit D  level = 49... Continue supplements  Hx of ANEMIA (ICD-285.9) -  resolved... ~  labs 9/10 showed Hg= 13.6 ~  labs 9/11 showed Hg= 13.2 ~  Labs 3/13 showed Hg= 13.4 ~  Labs 3/14 showed Hg= 13.3 ~  Labs 3/15 showed Hg= 13.4 ~  Labs 3/16 showed Hg= 13.1  ECZEMA - she has hx of eczema on her hands and has used CoQ10 and Borage Oil as rec by an herbaAdministratorenn.Plandome Manorecently changed to [facility] instead of these other supplements &  rash is much improved...   Past Surgical History  Procedure Laterality Date  . Lung biopsy  1968    TSurg---Dr. [name]  . Left breast lumpectomy and ln dissection  06/1992    Dr. [name]  . Tonsillectomy    . Colonoscopy    . Lung biopsy  2012    dr [name]  . Total knee arthroplasty Right 01/21/2015    Procedure: TOTAL RIGHT KNEE ARTHROPLASTY;  Surgeon: [name], MD;  Location: [facility];  Service: Orthopedics;  Laterality: Right;  . Reverse total shoulder arthroplasty Right 08/02/2015  . Reverse shoulder arthroplasty Right 08/02/2015    Procedure: RIGHT REVERSE TOTAL SHOULDER ARTHROPLASTY;  Surgeon: [name], MD;  Location: [place];  Service: Orthopedics;  Laterality: Right;    Outpatient Encounter Prescriptions as of 08/14/2015  Medication Sig  . acetaminophen (TYLENOL) 500 MG tablet Take 500 mg by mouth every 6 (six) hours as needed.  . Calcium Carb-Cholecalciferol 600-800 MG-UNIT TABS Take 1 tablet by mouth daily.  . cholecalciferol (VITAMIN D) 1000 UNITS tablet Take 1,000 Units by mouth daily.  Marland Kitchen diltiazem (CARDIZEM CD) 120 MG 24 hr capsule Take 1 capsule (120 mg total) by mouth daily.  . ferrous sulfate 325 (65 FE) MG tablet Take 1 tablet (325 mg total) by mouth 3 (three) times daily after meals.  . Fluticasone-Salmeterol (ADVAIR) 250-50 MCG/DOSE AEPB Inhale 1 puff into the lungs daily.  Marland Kitchen guaiFENesin (MUCINEX) 600 MG 12 hr tablet Take 1,200 mg by mouth 2 (two) times daily.  Marland Kitchen HYDROcodone-acetaminophen (NORCO) 5-325 MG tablet Take 1 tablet by mouth  every 6 (six) hours as needed for moderate pain.  Marland Kitchen losartan (COZAAR) 50 MG tablet Take 1 tablet (50 mg total) by mouth daily.  . Multiple Vitamins-Minerals (MULTIVITAMIN & MINERAL PO) Take 1 tablet by mouth daily.    . Probiotic Product (ALIGN PO) Take 1 tablet by mouth daily.   Merril Abbe 10 MCG TABS vaginal tablet PLACE 1 TABLET VAGINALLY TWICE A WEEK  . ciprofloxacin (CIPRO) 500 MG tablet Take 1 tablet (500 mg total) by mouth 2 (two) times daily.  Marland Kitchen ipratropium-albuterol (DUONEB) 0.5-2.5 (3) MG/3ML SOLN Take 3 mLs by nebulization 3 (three) times daily.   No facility-administered encounter medications on file as of 08/14/2015.    No Known Allergies   Current Medications, Allergies, Past Medical History, Past Surgical History, Family History, and Social History were reviewed in [employer] record.     Review of Systems         See HPI - all other systems neg except as noted...  The patient complains of dyspnea on exertion and abdominal pain.  The patient denies anorexia, fever, weight loss, weight gain, vision loss, decreased hearing, hoarseness, chest pain, syncope, peripheral edema, prolonged cough, headaches, hemoptysis, melena, hematochezia, severe indigestion/heartburn, hematuria, incontinence, muscle weakness, suspicious skin lesions, transient blindness, difficulty walking, depression, unusual weight change, abnormal bleeding, enlarged lymph nodes, and angioedema.     Objective:   Physical Exam     WD, WN, 82 y/o WF in NAD... GENERAL:  Alert & oriented; pleasant & cooperative... HEENT:  [place]/AT, EOM-wnl, PERRLA, EACs-clear, TMs-wnl, NOSE-clear, THROAT-clear & wnl. NECK:  Supple w/ fairROM; no JVD; normal carotid impulses w/o bruits; no thyromegaly or nodules palpated; no lymphadenopathy. CHEST:  scat bilat rhonchi/ congestion without wheezing, rales or signs of consolidation... HEART:  Regular Rhythm; without murmurs/ rubs/ or gallops heard... ABDOMEN:   Soft w/ mild tender LLQ; normal bowel sounds; no organomegaly or  masses detected. EXT: without deformities or arthritic changes; no varicose veins/ venous insuffic/ or edema. NEURO:  CN's intact;  no focal neuro deficits... DERM:  No lesions noted; no rash etc...  RADIOLOGY DATA:  Reviewed in the EPIC EMR & discussed w/ the patient...  LABORATORY DATA:  Reviewed in the EPIC EMR & discussed w/ the patient...    Assessment & Plan:    COPD/ Bronchiectasis/ Hx pneumonia>  On Advair250, Mucinex2Bid, Fluids & vigorous Chest PT/ postural drainage... 9/13> treating acute exac w/ Levaquin x10d; Cult grew a resist Pseudomonas but she reports clinically improved, therefore continue present Rx & follow...  Repeat Sput C&S 4/14 showed pansens Pseudomonas... Baseline CXRs showed CXR 3/15 & 3/16 showed scarring LUL, no change, and surg clips left breast & axilla... PFTs 9/15 showed FVC= 2.08 (77%), FEV1= 1.22 (61%), %1sec= 59%, mid-flows= 31% predicted... c/w GOLD Stage2 COPD, pt GroupB... REMINDED TO TAKE MEDS REGULARLY & MAINTAIN A VIGOROUS REGIMEN OF CHEST PT & POSTURAL DRAINAGE... 07/2015> she developed a bronchiectasis exac after R shoulder surg when she was no longer able to do her chest Pt & postural drain=> cult grew pseudomonas sens to cipro=> treated w/ this + home NEB using DUONEB Tid...   HBP & palpit (PACs)>  followed by Cherly Hensen; on CardizemCD120 & she does not want BBlockers, & only wants "low-dose" meds... 10/16>   BP at home variable w/ 160/100+ episodes so we decided to add low dose LOSARTAN 2m/d... 1/17>   BP much better w/ the Losar50 added- continue same...   CHOL>  On diet alone & f/u FLP is at goal...  Breast Cancer>  [facility] Dr[facility], then [place] 2003 & released; yearly mammograms & monthly self exams were wnl...  Divertics>  She had acute diverticulitis 3/12 & 10/12> resolved w/ diet adjust & Cipro/Flagyl, now back to baseline &  advised re- Metamucil, Fiberall, etc; she saw DrPatterson 11/12.  DJD/ LBP>  Ortho is DrOlin & DrFields; she takes Glucosamine & Herbs;  11/16>   s/p right TKR 12/2014 by DrOlin 6/17>   sched for right total shoulder replacement by DrNorris  Osteopenia/ Vit D defic>  On Calcium, MVI, Vit D supplement; followed by GYN= DrRomaine...  Other medical issues as noted...    Patient's Medications  New Prescriptions   CIPROFLOXACIN (CIPRO) 500 MG TABLET    Take 1 tablet (500 mg total) by mouth 2 (two) times daily.   IPRATROPIUM-ALBUTEROL (DUONEB) 0.5-2.5 (3) MG/3ML SOLN    Take 3 mLs by nebulization 3 (three) times daily.  Previous Medications   ACETAMINOPHEN (TYLENOL) 500 MG TABLET    Take 500 mg by mouth every 6 (six) hours as needed.   CALCIUM CARB-CHOLECALCIFEROL 600-800 MG-UNIT TABS    Take 1 tablet by mouth daily.   CHOLECALCIFEROL (VITAMIN D) 1000 UNITS TABLET    Take 1,000 Units by mouth daily.   DILTIAZEM (CARDIZEM CD) 120 MG 24 HR CAPSULE    Take 1 capsule (120 mg total) by mouth daily.   FERROUS SULFATE 325 (65 FE) MG TABLET    Take 1 tablet (325 mg total) by mouth 3 (three) times daily after meals.   FLUTICASONE-SALMETEROL (ADVAIR) 250-50 MCG/DOSE AEPB    Inhale 1 puff into the lungs daily.   GUAIFENESIN (MUCINEX) 600 MG 12 HR TABLET    Take 1,200 mg by mouth 2 (two) times daily.   HYDROCODONE-ACETAMINOPHEN (NORCO) 5-325 MG TABLET    Take 1 tablet by mouth  every 6 (six) hours as needed for moderate pain.   LOSARTAN (COZAAR) 50 MG TABLET    Take 1 tablet (50 mg total) by mouth daily.   MULTIPLE VITAMINS-MINERALS (MULTIVITAMIN & MINERAL PO)    Take 1 tablet by mouth daily.     PROBIOTIC PRODUCT (ALIGN PO)    Take 1 tablet by mouth daily.    YUVAFEM 10 MCG TABS VAGINAL TABLET    PLACE 1 TABLET VAGINALLY TWICE A WEEK  Modified Medications   No medications on file  Discontinued Medications   No medications on file

## 2015-08-14 NOTE — Telephone Encounter (Signed)
Spoke with pt. She has been added to SN's schedule today at 11am. Nothing further was needed.

## 2015-08-17 LAB — RESPIRATORY CULTURE OR RESPIRATORY AND SPUTUM CULTURE

## 2015-08-20 ENCOUNTER — Encounter: Payer: Self-pay | Admitting: Pulmonary Disease

## 2015-10-07 NOTE — Progress Notes (Signed)
Patient ID: Miranda Thompson, female   DOB: December 27, 1933, 82 y.o.   MRN: [id number]   82 y.o. referred by Dr [name] for palpitations in March of 2015 . Has bad chronic lung disease with bronchiectasis Has had long standing skips but seem to be worse lately with atrial bigeminy seen on office ECG recently. At one time BP monitor showed pulse of 157 and patient felt a bit dizzy No syncope She does not wear oxygen Echo 2 years ago showed good LV and no pulmonary hypertension. She feels occasional flutterin in chest and senses that her HR is always high in 90's She does have some wheezing No chest pain chronic mild dyspnea  No sputum or fever Labs reviewed and TSH and HCT ok this month No history of MAT or PAF No bleeding diathesis   Started on Atenolol 05/17/13   Eventually complained of low endurance and stopped at daughters request  Cardizem started last visit for palpitations and HTN  BP monitor showed good control  Echo ok with normal EF 3/27  Study Conclusions  - Left ventricle: The cavity size was normal. Systolic function was normal. The estimated ejection fraction was in the range of 55% to 60%. Wall motion was normal; there were no regional wall motion abnormalities. Doppler parameters are consistent with mildly elevated ventricular end-diastolic filling pressure. - Aortic valve: Trileaflet; mildly thickened leaflets. Transvalvular velocity was within the normal range. There was no stenosis. No regurgitation. - Aortic root: The aortic root was normal in size. - Mitral valve: Mildly thickened leaflets . Mild regurgitation. - Right ventricle: The cavity size was mildly dilated. Wall thickness was normal. Systolic function was normal. - Right atrium: The atrium was mildly dilated. - Atrial septum: No defect or patent foramen ovale was identified. - Tricuspid valve: Mild-moderate regurgitation. - Pulmonic valve: Mild regurgitation. - Pulmonary arteries: Systolic pressure was within  the normal range. PA peak pressure: 21mm Hg (S).  No complaints has had flu shot no recent lung infections and no palpitations   ROS: Denies fever, malais, weight loss, blurry vision, decreased visual acuity, cough, sputum, SOB, hemoptysis, pleuritic pain, palpitaitons, heartburn, abdominal pain, melena, lower extremity edema, claudication, or rash.  All other systems reviewed and negative  General: BP (!) 124/50 (BP Location: Left Arm, Patient Position: Sitting, Cuff Size: Normal)   Pulse 87   Ht 5\' 3"  (1.6 m)   Wt 136 lb 3.2 oz (61.8 kg)   LMP 02/23/1985   SpO2 94%   BMI 24.13 kg/m   Affect appropriate Frail elderly female  HEENT: normal Neck supple with no adenopathy JVP normal no bruits no thyromegaly Lungs rhonchi diffuse no  wheezing and good diaphragmatic motion Heart:  S1/S2 no murmur, no rub, gallop or click PMI normal Abdomen: benighn, BS positve, no tenderness, no AAA no bruit.  No HSM or HJR Distal pulses intact with no bruits No edema Neuro non-focal Skin warm and dry No muscular weakness   Current Outpatient Prescriptions  Medication Sig Dispense Refill  . acetaminophen (TYLENOL) 500 MG tablet Take 500 mg by mouth every 6 (six) hours as needed.    . Calcium Carb-Cholecalciferol 600-800 MG-UNIT TABS Take 1 tablet by mouth daily.    . cholecalciferol (VITAMIN D) 1000 UNITS tablet Take 1,000 Units by mouth daily.    Marland Kitchen diltiazem (CARDIZEM CD) 120 MG 24 hr capsule Take 1 capsule (120 mg total) by mouth daily. 30 capsule 5  . ferrous sulfate 325 (65 FE) MG tablet Take 1  tablet (325 mg total) by mouth 3 (three) times daily after meals.  3  . Fluticasone-Salmeterol (ADVAIR) 250-50 MCG/DOSE AEPB Inhale 1 puff into the lungs daily.    Marland Kitchen guaiFENesin (MUCINEX) 600 MG 12 hr tablet Take 1,200 mg by mouth 2 (two) times daily.    Marland Kitchen losartan (COZAAR) 50 MG tablet Take 1 tablet (50 mg total) by mouth daily. 30 tablet 5  . Multiple Vitamins-Minerals (MULTIVITAMIN & MINERAL PO)  Take 1 tablet by mouth daily.      . Probiotic Product (ALIGN PO) Take 1 tablet by mouth daily.     Merril Abbe 10 MCG TABS vaginal tablet PLACE 1 TABLET VAGINALLY TWICE A WEEK 24 tablet 4   No current facility-administered medications for this visit.     Allergies  Review of patient's allergies indicates no known allergies.  Electrocardiogram:  04/30/13  SR atrial bigeminy normal ST segments   10/2014 SR rate 76  LAE  Normal  03/18/15 SR rate 87 LAE otherwise normal   Assessment and Plan Arrhythmia:  Related to lung disease Atenolol stopped  Stable  Bronchiectasis:  F/u [name] lung exam always abnormal no wheezing activity level good Vasc:  Taking 81 mg aspirin for prevention HTN:  Steroid injection in knees should be out of system.  Thinks beta blocker made her more tired  Better with cardizem   Jenkins Rouge

## 2015-10-08 ENCOUNTER — Encounter: Payer: Self-pay | Admitting: Cardiovascular Disease

## 2015-10-08 ENCOUNTER — Ambulatory Visit (INDEPENDENT_AMBULATORY_CARE_PROVIDER_SITE_OTHER): Payer: Medicare Other | Admitting: Cardiovascular Disease

## 2015-10-08 VITALS — BP 124/50 | HR 87 | Ht 63.0 in | Wt 136.2 lb

## 2015-10-08 DIAGNOSIS — I498 Other specified cardiac arrhythmias: Secondary | ICD-10-CM

## 2015-10-08 NOTE — Patient Instructions (Signed)

## 2015-12-13 ENCOUNTER — Other Ambulatory Visit: Payer: Self-pay | Admitting: Pulmonary Disease

## 2015-12-27 ENCOUNTER — Encounter (HOSPITAL_COMMUNITY): Payer: Medicare Other

## 2015-12-30 ENCOUNTER — Encounter: Payer: Self-pay | Admitting: Pulmonary Disease

## 2015-12-31 ENCOUNTER — Other Ambulatory Visit: Payer: Self-pay | Admitting: Pulmonary Disease

## 2015-12-31 ENCOUNTER — Telehealth: Payer: Self-pay | Admitting: Pulmonary Disease

## 2015-12-31 DIAGNOSIS — R05 Cough: Secondary | ICD-10-CM

## 2015-12-31 DIAGNOSIS — R059 Cough, unspecified: Secondary | ICD-10-CM

## 2015-12-31 NOTE — Telephone Encounter (Signed)
Spoke with pt who states she is feeling a little better but still has a low grade temp of 99.6, prod cough with yellow mucus & chest tightness.  Pt states she is taking alka seltzer to help with the fever X3d She also states that It has been four days since she stopped taking Amoxicillin 750mg  which she took for ten days after the extraction of an abscessed tooth.    SN please advise. Thanks.

## 2015-12-31 NOTE — Telephone Encounter (Signed)
Checked pt's chart. There is a telephone encounter created. Will close off on MyChart message.

## 2015-12-31 NOTE — Telephone Encounter (Signed)
Per SN---  This should be enough for now No other abx at this time Bring a early morning sputum sample to the lab for c&s Use tylenol for low grade fever.   Called and spoke with pt and she is aware of SN recs.  Order has been placed and pt will bring in sample.

## 2016-01-01 ENCOUNTER — Other Ambulatory Visit: Payer: Medicare Other

## 2016-01-01 DIAGNOSIS — R05 Cough: Secondary | ICD-10-CM

## 2016-01-01 DIAGNOSIS — R059 Cough, unspecified: Secondary | ICD-10-CM

## 2016-01-03 ENCOUNTER — Telehealth: Payer: Self-pay | Admitting: Pulmonary Disease

## 2016-01-03 MED ORDER — LEVOFLOXACIN 750 MG PO TABS: 750.0000 mg | ORAL_TABLET | Freq: Every day | ORAL | 0 refills | Status: DC

## 2016-01-03 NOTE — Telephone Encounter (Signed)
Per SN- sputum 11/8 showed multiple organisms present in sputum, none predominant.  Recommend levaquin 750mg  #10 1 po qd, take align and eat yogurt with this.  lmtcb X1 to make pt aware of recs.    Called abx in to only local pharmacy on file d/t it being Friday afternoon.  Wcb.

## 2016-01-03 NOTE — Telephone Encounter (Signed)
Pt requesting sputum culture results from 01-01-16.  SN please advise. Thanks.

## 2016-01-04 LAB — RESPIRATORY CULTURE OR RESPIRATORY AND SPUTUM CULTURE

## 2016-01-06 ENCOUNTER — Inpatient Hospital Stay: Admit: 2016-01-06 | Payer: Medicare Other | Admitting: Orthopedic Surgery

## 2016-01-06 SURGERY — ARTHROPLASTY, KNEE, TOTAL
Anesthesia: Spinal | Site: Knee | Laterality: Left

## 2016-01-06 NOTE — Telephone Encounter (Signed)
Spoke with pt. She is aware that prescription was sent in, she picked this up. Nothing further was needed.

## 2016-01-12 ENCOUNTER — Encounter: Payer: Self-pay | Admitting: Pulmonary Disease

## 2016-01-13 NOTE — Telephone Encounter (Signed)
Pt sent two e-mails on 11.19.17: Message   Hello again, hope you had a nice weekend and no doubt are looking forward to the Thanksgiving holiday, I now that I am. Have a good one.    Questions for Dr. [name]: I had been using a spirometer that I received from the hospital after a June surgery, I wash the tubing in the dishwasher once a week but wonder if that is sufficient, or, is there a better way to sanitize it, I also have a flutter and usually wash the mouthpiece in soapy water every three days. I have not been using either device since my cold started and it eventually went to my chest. DoesDr. [name] feel that I might have caused the pseudomonas flare up by using the spirometer?    Also, my husband and children regard me as being in a very fragile state, and indeed, I was very sick; however, my temperature is low grade at best and my cough and sputum production has decreased from the initial onset of the illness. My family thinks I should not go out to stores, movies, etc. Is it O K to do so?    Thanks,    [employer] again, I just wanted Dr. [name] to know that when I did my postural draining this evening, I did spit up bright red blood. It did come up when I coughed it up, it did not just pour out, had perhaps 11 tissues or so.    I'm going to check in the morning and if I raise more blood, I will put it in a container.     Please let me know If Dr. [name] wants me to come in for an x-ray.    Thanks,    Miranda Thompson  [phone]   Called spoke with patient who reported that she had the one episode of hemoptysis last night, BRB not mixed with mucus, measuring approx 1 teaspoon.  This morning she produced dark red with green/yellow mucus.  She denies any increased wheezing, tightness, dyspnea, chest pain.  Dr [name] please advise on all of the above, thank you.  Per 6.21.17 ov w/ SN: Assessment & Plan:  COPD/ Bronchiectasis/ Hx pneumonia>  On Advair250,  Mucinex2Bid, Fluids & vigorous Chest PT/ postural drainage... 9/13> treating acute exac w/ Levaquin x10d; Cult grew a resist Pseudomonas but she reports clinically improved, therefore continue present Rx & follow...  Repeat Sput C&S 4/14 showed pansens Pseudomonas... Baseline CXRs showed CXR 3/15 & 3/16 showed scarring LUL, no change, and surg clips left breast & axilla... PFTs 9/15 showed FVC= 2.08 (77%), FEV1= 1.22 (61%), %1sec= 59%, mid-flows= 31% predicted... c/w GOLD Stage2 COPD, pt GroupB... REMINDED TO TAKE MEDS REGULARLY & MAINTAIN A VIGOROUS REGIMEN OF CHEST PT & POSTURAL DRAINAGE... 07/2015> she developed a bronchiectasis exac after R shoulder surg when she was no longer able to do her chest Pt & postural drain=> cult grew pseudomonas sens to cipro=> treated w/ this + home NEB using DUONEB Tid...  Patient Instructions  Today we updated your med list in our EPIC system...    Continue your current medications the same...   We decided to prescribe a HOME NEBULIZER w/ Duoneb medication (Albuterol + Ipratropium in one vial) Three times daily...    We will start a 10d course of CIPRO 500mg  Twice daily...    We sent the sputum specimen for culture to be sure the germ is not resistent).Marland KitchenMarland Kitchen  Keep up your good work w/ the physical therapy...   Call for any questions or if we can be of service in any way.Marland KitchenMarland Kitchen

## 2016-01-14 ENCOUNTER — Telehealth: Payer: Self-pay | Admitting: Pulmonary Disease

## 2016-01-14 NOTE — Telephone Encounter (Signed)
Pt has dental work scheduled for Monday, 11/27 and she wanted to make sure this was ok to have done with the infection that she has.  SN please advise. Thanks  No Known Allergies

## 2016-01-20 NOTE — Telephone Encounter (Signed)
Called and spoke with pt and she stated that she did have her dental work this morning. Nothing further is needed.

## 2016-01-24 ENCOUNTER — Encounter: Payer: Self-pay | Admitting: Pulmonary Disease

## 2016-01-24 NOTE — Telephone Encounter (Signed)
See patient email:  Hi, I have an appointment with Dr. [name] on February 03, 2016 for my 6 month check-up. Would Dr. [name] consider putting an order in for me to have an x-ray, CT scan and/or blood work or whatever tests he wants me to have and do it the week of 12/04 in order to have the results for my exam? I will bring an early morning sputum sample to the exam as suggested by Dr. [name]. I don't recall whether or not I am due to have any tests and I do know that insurance companies dictate procedures and time frames.    Since I've been functioning at half throttle since my lung infection started in early Nov, and am scheduled for knee surgery January 29, it is important for me to be in good health. I am looking forward to my next visit with Dr. [name].    Have a nice weekend and thanks for your attention to this matter.    Miranda Thompson   SN please advise if you'd like patient to have any imaging or labs prior to 12/11 office visit.  Thanks!

## 2016-01-27 ENCOUNTER — Other Ambulatory Visit: Payer: Self-pay | Admitting: Pulmonary Disease

## 2016-01-27 DIAGNOSIS — J479 Bronchiectasis, uncomplicated: Secondary | ICD-10-CM

## 2016-01-28 ENCOUNTER — Other Ambulatory Visit (INDEPENDENT_AMBULATORY_CARE_PROVIDER_SITE_OTHER): Payer: Medicare Other

## 2016-01-28 ENCOUNTER — Ambulatory Visit (INDEPENDENT_AMBULATORY_CARE_PROVIDER_SITE_OTHER)
Admission: RE | Admit: 2016-01-28 | Discharge: 2016-01-28 | Disposition: A | Discharge status: Home / Self Care | Payer: Medicare Other | Source: Ambulatory Visit | Attending: Pulmonary Disease | Admitting: Pulmonary Disease

## 2016-01-28 DIAGNOSIS — J479 Bronchiectasis, uncomplicated: Secondary | ICD-10-CM

## 2016-01-28 LAB — CBC WITH DIFFERENTIAL/PLATELET
Basophils Absolute: 0 10*3/uL (ref 0.0–0.1)
Basophils Relative: 0.9 % (ref 0.0–3.0)
Eosinophils Absolute: 0.3 10*3/uL (ref 0.0–0.7)
Eosinophils Relative: 6.5 % — ABNORMAL HIGH (ref 0.0–5.0)
HCT: 36.2 % (ref 36.0–46.0)
Hemoglobin: 12.4 g/dL (ref 12.0–15.0)
Lymphocytes Relative: 24.6 % (ref 12.0–46.0)
Lymphs Abs: 1.2 10*3/uL (ref 0.7–4.0)
MCHC: 34.3 g/dL (ref 30.0–36.0)
MCV: 95.6 fl (ref 78.0–100.0)
Monocytes Absolute: 0.5 10*3/uL (ref 0.1–1.0)
Monocytes Relative: 10.3 % (ref 3.0–12.0)
Neutro Abs: 2.9 10*3/uL (ref 1.4–7.7)
Neutrophils Relative %: 57.7 % (ref 43.0–77.0)
Platelets: 272 10*3/uL (ref 150.0–400.0)
RBC: 3.79 Mil/uL — ABNORMAL LOW (ref 3.87–5.11)
RDW: 13.5 % (ref 11.5–15.5)
WBC: 5 10*3/uL (ref 4.0–10.5)

## 2016-01-28 LAB — BASIC METABOLIC PANEL
BUN: 12 mg/dL (ref 6–23)
CO2: 28 mEq/L (ref 19–32)
Calcium: 9.4 mg/dL (ref 8.4–10.5)
Chloride: 100 mEq/L (ref 96–112)
Creatinine, Ser: 0.64 mg/dL (ref 0.40–1.20)
GFR: 94.73 mL/min (ref >60.00)
Glucose, Bld: 96 mg/dL (ref 70–99)
Potassium: 4.8 mEq/L (ref 3.5–5.1)
Sodium: 135 mEq/L (ref 135–145)

## 2016-01-28 LAB — SEDIMENTATION RATE: Sed Rate: 23 mm/hr (ref 0–30)

## 2016-02-03 ENCOUNTER — Encounter: Payer: Self-pay | Admitting: Pulmonary Disease

## 2016-02-03 ENCOUNTER — Ambulatory Visit (INDEPENDENT_AMBULATORY_CARE_PROVIDER_SITE_OTHER): Payer: Medicare Other | Admitting: Pulmonary Disease

## 2016-02-03 VITALS — BP 124/68 | HR 94 | Temp 97.3°F | Ht 63.0 in | Wt 135.8 lb

## 2016-02-03 DIAGNOSIS — J479 Bronchiectasis, uncomplicated: Secondary | ICD-10-CM

## 2016-02-03 DIAGNOSIS — I498 Other specified cardiac arrhythmias: Secondary | ICD-10-CM

## 2016-02-03 DIAGNOSIS — Z96611 Presence of right artificial shoulder joint: Secondary | ICD-10-CM

## 2016-02-03 DIAGNOSIS — M159 Polyosteoarthritis, unspecified: Secondary | ICD-10-CM

## 2016-02-03 DIAGNOSIS — Z96659 Presence of unspecified artificial knee joint: Secondary | ICD-10-CM | POA: Diagnosis not present

## 2016-02-03 DIAGNOSIS — M8949 Other hypertrophic osteoarthropathy, multiple sites: Secondary | ICD-10-CM

## 2016-02-03 DIAGNOSIS — M150 Primary generalized (osteo)arthritis: Secondary | ICD-10-CM

## 2016-02-03 NOTE — Patient Instructions (Signed)
Today we updated your med list in our EPIC system...    Continue your current medications the same...  Keep up the good work w/ your Chest physiotherapy, postural drainage, Mucinex, etc...  I will send a note to DrOlin regarding your upcoming knee surgery...  Call for any questions...  Let's plan a follow up visit in67mo , sooner if needed for problems.Marland KitchenMarland Kitchen

## 2016-02-03 NOTE — Progress Notes (Signed)
Patient ID: [name], female   DOB: 1934/02/19, 82 y.o.   MRN: [id number]  Subjective:    Patient ID: Miranda Thompson, female    DOB: [birth year], 82 y.o.   MRN: [id number]  HPI 82 y/o WF here for a follow up visit... she has mult med problems as noted below...   ~  SEE PREV EPIC NOTES FOR OLDER DATA >>    Hx left breast cancer w/ XRT & chemorx in 1994  08/19/04>  CT Chest showed extensive area of marked cylindrical bronchiectasis on the left, some assoc calcif, no signif adenopathy, prior left breast surg...  LABS 3/14:  FLP- at goals on diet alone;  Chems- wnl;  CBC- wnl;  TSH=2.37;  VitD=31;  UA- clear...  SPUTUM 4/14 >> Pseudomonas sensitive to all antibiotics   LABS 9/14:  CBC- ok w/ Hg=13.7, WBC=6.4;  Fe=91 (25%sat)... BMet wasn't done...   CXR 3/15 showed stable film w/ scarring LUL, no change and surg clips left breast & axilla  EKG 3/15 showed NSR w/ PACs and atrial bigem, 1st degree AVB, otherw NAD...   2DEcho 3/15 showed norm LV size & function w/ EF=55-60%, norm wall motion, valves ok w/ sl thickening of leaflets- mildMR, modTR,; mild RV dil but norm RVF & PAsys=31  LABS 3/15:  FLP- ok on diet alone;  Chems- wnl;  CBC- wnl;  Fe=69 (19%sat);  VitD=49;  TSH=4.33... UA is clear, wnl...   ~  November 08, 2013:  45moROV & [name] had an exac 6/15- seen by TP, CXR stable (NAD), treated w/ Levaquin/ Mucinex & improved, but she stopped her Advair & now notes sl incr SOB;  PFT w/ FEV1=1.22 (61%) & she is rec to restart her inhaler;  Notes chr cough w/ green-gray phlegm from her severe bronchiectasis (last pos sputum was 4/14 +Pseudomonas- pansensitive);  EXAM shows bilat rhonchi, no consolidation, and she is stable on her regular regimen and vigorous chest physiotherapy regimen to aide sputum production...     She had f/u DrNishan 4/15> hx palpit, bigeminy on EKG, improved on Aten25; no changes made...    She continues to follow up w/ DrFields> right knee pain, wears brace &  gets shots periodically, his notes are reviewed...  We reviewed prob list, meds, xrays and labs> see below for updates >> OK 2015 Flu vaccine today...   PFT 9/15 showed FVC= 2.08 (77%), FEV1= 1.22 (61%), %1sec= 59%, mid-flows= 31% predicted... c/w GOLD Stage2 COPD, pt GroupB...  Ambulatory O2 Test>  O2sat on RA at rest= 96% w/ pulse=73/min;  Ambulated 3 laps w/ lowest O2sat= 96% w/ pulse=94/min.... PLAN>> continue same Advair250Bid, Mucinex 1200Bid, Fluids, vigorous home chest PT & postural drainage, stay as active as poss...  ~  May 09, 2014:  644moOV & Kelleigh reports a good interval- no intercurrent infections, breathing is about the same but she notes some sl incr DOE assoc w/ decr exercise due to knee arthritis; she sees drFields and as had several shots but wonders about TKR- given the names for DrAlusio & DrOlin as contacts... We reviewed the following medical problems during today's office visit >>     Bronchiectasis & HxPneumonia> on Advair250Bid (not taking regularly) & GFN 1200Bid; PFT 9/15 w/ GOLD stage2-B COPD; mod cough, grey colored sput, intermit hemoptysis, denies f/c/s; last pos culture was 4/14= Pseudomonas, pan-sens & treated w/ Cipro at that time; she does regular ChestPT...    Palpitations & PACs> she had PACs & atrial bigeminy; improved on  Atenolol25; seen by Cherly Hensen 4/15 & 12/15- no AFib or MAT & no changes made...    CHOL> on diet alone; FLP 3/15 showed TChol 172, TG 50, HDL 56, LDL 106    GI- Divertics> asked to take Align, Metamucil, etc; she had diverticulitis flair 2012 x2 & treated by DrPatterson w/ Cipro & Flagyl    GU- HxUTI w/ pelvic pressure, back pain, freq&urgency> felt to have a chronic cystitis by DrEskridge & she understands asymptomatic bactiuria...    Hx Breast Cancer> she had surg 1994 w/ lumpectomy & LN dissection followed by XRT & Chemorx; she was [facility]; followed by [facility]- seen 12/15 w/ neg exam & mammogram 3/16-  OK...    DJD, LBP, Osteopenia> on Tylenol, Estradiol topically per Gyn, calcium, MVI, VitD1000; she walks >15m/d for exercise; followed by DrFields regularly; c/o incr right knee pain & she requested Ortho names for poss TKR (Alusio/ OAlvan Dame; [facility] DrRomaine=> now seeing DrSMiller...    HxEczema> she uses CoQ10,Borage Oil & black current; she likes herbal remedies...  We reviewed prob list, meds, xrays and labs> see below for updates >>   CXR 3/16 is stable- chronic changes on left, clips from prev left breast surg, DDD in Tspine...  LABS 3/16:  FLP- wnl on diet alone;  Chems- wnl;  CBC- wnl;  TSH=4.99...  ~  November 06, 2014:  634moOV & medical f/u visit>     From the pulmonary standpoint she is stable w/ her severe bronchiectasis but only using her Advair250 once daily & only taking the Mucinex 60053md; asked to incr the Advair250Bid & the Mucinex600-2Bid w/ fluids and maintain a vigorous chest PT & postural drainage routine to clear her airways...    She had f/u [facility]- DrNishan 10/25/14> hx palpit w/ atrial bigeminy on office EKG; prev tried Aten but stopped due to low endurance, 2DEcho 04/2013 showed norm EF=55-60%, no wall motion abn, mildMR, PAsys=31; he added CARDIZEM-CD 240m38mbut pt states that she didn't take it stating that she "only wants low-dose meds" => therefore rec to try CARDIZEN-CD 120mg30m.    She also notes that her BP is "all over the place" but BP monitor was apparently ok; she read her EKG on my chart & had a lot of questions; currently not taking any BP med & BP today= 140/80; I told her that the Cardizem would help regulate her blood pressure as well as help control her palpitations...     She had a routine f/u colonoscopy 08/08/14 by DrPyrtle> +FamHx colon cancer in father; 2 sessile polyps removed, 3-5mm s90m, mod diverticulosis, Path= tubular adenomas...    She saw DrFields for her knees w/ grade4 arthritis on right, cortisone shots lasted  several months, uses compression sleeve & Aleve/ Tramadol; he referred her to DrOlinGeorgetownotes "bone on bone" & rec bilat TKRs...  EXAM shows Afeb, VSS, O2sat=96% on RA;  HEENT- neg, mallampati1;  Chest- diffuse bilat rhonchi/ congestion, w/o wheezing or signs of consolidation;  Heart- RR gr1/6 SEM w/o r/g;  Abd- soft, neg;  Ext- w/o c/c/e...  EKG 06/19/14 showed NSR, rate78, poor R progression V1-3, otherw neg/NAD...  We reviewed prev CXR, LABS, etc... IMP/PLAN>>  She reminded me that her daughter didn't want her on Atenolol, & she only wants "low-dose" meds therefore try CARDIZEM-CD 120mg/d44meeds to incr the Advair250 to Bid & the Mucinex 600mg to2md w/ fluids + continue the postural  drainage/ chest PT regimen... OK 2016 flu vaccine today.  ~  December 18, 2014:  6wk ROV & recheck> Jessicia has been taking the CardizemCD120 since last OV & watching her home BP checks very carefully; pressure readings are somewhat labile- 110/70 range to 160/100+ range using her home cuff; she remains asymptomatic w/o HA, CP, palpit, dizzy, edema, etc; exercise is limited by her knees and she tells me she is ready for TKR surg by DrOlin; we decided to add LOSARTAN34m/d for her BP control & she will continue to monitor it at home... EXAM shows Afeb, VSS, O2sat=96% on RA;  BP=132/74; HEENT- neg, mallampati1;  Chest- diffuse bilat rhonchi w/o wheezing or signs of consolidation;  Heart- RR gr1/6 SEM w/o r/g;  Abd- soft, neg;  Ext- w/o c/c/e... PLAN>>  She will continue all current meds and ADD LOSARTAN 519md; watch BPs at home & call for questions; she asks that I send this note to DrWabaunseeor surg from the medical standpoint...  ~  March 20, 2015:  77m67moV & Aliya had her right TKR by DrOlin 01/21/15, s/p PT, still sl stiff but exercising on bike & walking some- no complications and she is very pleased;  BP is improved w/ the addition of Losartan50 & measures 142/62 today... We reviewed the following medical  problems during today's office visit >>     Bronchiectasis & HxPneumonia> on Advair250Bid (not taking regularly) & GFN 1200Bid; PFT 9/15 w/ GOLD stage2-B COPD; mod cough, grey colored sput, intermit hemoptysis, denies f/c/s; last pos culture was 4/14= Pseudomonas, pan-sens & treated w/ Cipro at that time; she does regular ChestPT which really helps...    Palpitations & PACs> she had PACs & atrial bigeminy; improved on Atenolol25; followed by DrNCherly Hensenchanged to CardizemCD120 (she refused 240 dose), last seen 03/18/15- no AFib or MAT & no changes made...    CHOL> on diet alone; FLP 3/16 showed TChol 163, TG 70, HDL 53, LDL 96    GI- Divertics> asked to take Align, Metamucil, etc; she had diverticulitis flair in 2012 x2 & treated by DrPatterson w/ Cipro & Flagyl, ok since then.    GU- HxUTI w/ pelvic pressure, back pain, freq&urgency> felt to have a chronic cystitis by DrEskridge & she understands asymptomatic bactiuria...    Hx Breast Cancer> she had surg 1994 w/ lumpectomy & LN dissection followed by XRT & Chemorx; [facility] w/ neg exam & mammogram 3/16- OK...    DJD, LBP, Osteopenia> on Tylenol, Estradiol topically per Gyn, calcium, MVI, VitD1000; she walks >1mi777mfor exercise; followed by DrFields & OlinAlvan Damep right TKR 12/2014; [facility]...     HxEczema> she uses CoQ10,Borage Oil & black current; she likes herbal remedies...  EXAM shows Afeb, VSS, O2sat=96% on RA;  HEENT- neg, mallampati1;  Chest- rhonchi L>R, w/o w/r/consolidation;  Heart- RR w/o m/r/g;  Abd- soft, nontender, neg;  Ext- s/p R-TKR, w/o c/c/e;  Neuro- intact...  EKG 03/18/15 showed NSR, rate87, prob LAE, poor R prog V1-3, otherw STTW are ok...  LABS 03/20/15> we discussed FASTING labs on return... IMP/PLAN>>  Mult medical problems as listed- MariArloastable & did well w/ the knee replacement surg; continue same meds and  her vigorous chest PT regimen; we plan recheck in 3-63mo 53moasting blood work.  ~  August 01, 2015:  4-53mo R67mo MarilyStarlaer  right TKR 12/2014 by DrOlin & is now sched for a right reverse total shoulder arthroplasty by DrNorris tomorrow!  She tells me that she still needs to have her left knee done;  She notes that her breathing is OK w/ mild chr cough, min sput production, & DOE w/o change (she walks, rides bike, etc);  She recently ret for FLabs;  We reviewed the following medical problems during today's office visit >>     Bronchiectasis & HxPneumonia> on Advair250Bid & GFN 1200Bid; she is supposed to be doing Flutter treatments and postural drainage as well; PFT 9/15 w/ GOLD stage2-B COPD; mod cough, grey colored sput, intermit hemoptysis, denies f/c/s; last pos culture was 4/14= Pseudomonas, pan-sens & treated w/ Cipro at that time; the regular ChestPT which really helps...    Palpitations & PACs> she had PACs & atrial bigeminy; improved on Atenolol25 (but c/o low endurance); followed by Cherly Hensen & changed to CardizemCD120 (she refused 240 dose) + Losar50, last seen 03/18/15- no AFib or MAT & no changes made...    CHOL> on diet alone; FLP 6/17 showed TChol 174, TG 64, HDL 62, LDL 100    GI- Divertics> asked to take Align, Metamucil, etc; she had diverticulitis flair in 2012 x2 & treated by DrPatterson w/ Cipro & Flagyl, ok since then.    GU- HxUTI w/ pelvic pressure, back pain, freq&urgency> felt to have a chronic cystitis by DrEskridge & she understands asymptomatic bactiuria...    Hx Breast Cancer> surg 1994 w/ lumpectomy & LN dissection followed by XRT & Chemorx; she was released by [facility] in 2003; followed by [facility]- seen 12/15 w/ neg exam & mammogram 3/16- OK...    DJD, LBP, Osteopenia> on Tylenol, Estradiol topically per Gyn, calcium, MVI, VitD1000; she walks >78m/d for exercise; followed by DrFields & OAlvan Dame s/p right TKR 12/2014; [facility]  [facility]yn...     HxEczema> she uses CoQ10, Borage Oil & black current; she likes herbal remedies...  EXAM shows Afeb, VSS, O2sat=98% on RA;  HEENT- neg, mallampati1;  Chest- rhonchi L>R, w/o w/r/consolidation;  Heart- RR w/o m/r/g;  Abd- soft, nontender, neg;  Ext- s/p R-TKR, decr ROM right shoulder, w/o c/c/e;  Neuro- intact...  FLABS 07/29/15>  FLP- all parameters at goals on diet alone;  Chems- wnl x Na=132;  CBC- ok w/ Hg=12.2, MCV=97;  TSH=2.61.. IMP/PLAN>>  MMichais stable w/ her severe bronchiectasis & reminded to be diligent w/ her meds and Chest PT treatments;  she is sched for the next of her planned orthopedic procedures;  We plan ROV in 630mosooner if needed prn.  ~  August 14, 2015:  2wk ROV & add-on appt requested for increased cough, green sput production, and difficulty managing the congestion;  She tells me that she had her right shoulder replacement 2 wks ago & since then has been unable to do her postural drainage for her bronchiectasis resulting in incr cough, gren/gray mucus, incr chest congestion, low grade temp & dyspnea; she says her chest feels full;  Home meds include> ADVAIR250Bid, & Mucinex600-2Bid. But she also does a vigorous chest PT regimen w/ flutter valve treatments and postural drainage...     EXAM shows Afeb, VSS, O2sat=97% on RA;  HEENT- neg, mallampati1;  Chest- congested, rhonchi L>R, w/o w/r/consolidation;  Heart- RR w/o m/r/g;  Ext- s/p R-TKR, s/p R shoulder surg/ sling,..   Sput C&S>  Culture grew PSEUDOMONAS (again) & it remains sens to CIPRO...Marland KitchenMarland Kitchen  IMP/PLAN>>  Pt given Duoneb treatment here in office w/ mod thick green sputum=> sent for C&S which grew Pseudomonas sens to Cipro; we treated her w/ Cipro500Bid & NEB w/ Duoneb Tid regularly; she knows to return to her postural drainage treatments as soon as her shoulder recuperation will allow...   ~  February 03, 2016:  71moRKiblercalled several times thru 12/2015 1st notingabscessed tooth treated by her dentist w/  Amocicillin x10d; then she developed a sore throat, URI, cough w/ yellow sput & low grade fever; we had her bring in a sput specimen (initially mult species, but later reported Psuedomonas R to [place] and treated her w/ Levaqun '750mg'$ x10d + yogurt; she had some intermittent cough w/ streaky hemoptysis, continued her vigorous home COPD regimen & gradually improved... Her Left-TKR surg is now sched for 03/23/16 per DrOlin...    Bronchiectasis & HxPneumonia> on Advair250Bid & GFN 1200Bid; she has refused NEBULIZER meds; she is supposed to be doing Flutter treatments and postural drainage as well; PFT 9/15 w/ GOLD stage2-B COPD; mod cough, grey colored sput, intermit hemoptysis, denies f/c/s; last pos culture was 4/14= Pseudomonas, pan-sens & treated w/ Cipro at that time; the regular ChestPT which really helps...    Palpitations & PACs> she had PACs & atrial bigeminy; improved on Atenolol25 (but c/o low endurance); followed by DCherly Hensen& changed Aten to CardizemCD120 (she refused 240 dose) + Losar50, last seen 03/18/15- no AFib or MAT & no changes made...    EXAM shows Afeb, VSS, O2sat=97% on RA;  HEENT- neg, mallampati1;  Chest- congested, rhonchi L>R, w/o w/r/consolidation;  Heart- RR w/o m/r/g;  Ext- s/p R-TKR, s/p R shoulder surg;  Neuro- neg, intact...  Sput C&S 01/01/16>  Mod PSEUDOMONAS- resist to Cipro/ Levaq and sens- Fortaz, Primaxin, Piperacillin...   CXR 01/28/16>  Norm heart size, hyperinflated w/ chr changes of scarring & bronchiectasis on left- NAD, osteopenia, right shoulder prosthesis, clips over left breast & left axilla  LABS 01/28/16>  Chems- wnl;  CBC- Hg=12.4, otherw wnl... IMP/PLAN>>  It would be additionally beneficial for her to be on a NEB e/ med Tid=Qid before her chest PT/ postural drainage treatments but she is quite sure she cannot tolerate the neb rx;  Her orthopedic issues mount & every surgery/ every general anesthetic takes a toll;  Rec to continue Advair250Bid, Mucinex  1200Bid, fluids, and the chest Rx...           Problem List:     ALLERGIC RHINITIS (ICD-477.9) - she uses OTC antihistamines Prn.  BRONCHIECTASIS (ICD-494.0) & Hx of PNEUMONIA (ICD-486) - on ADVAIR 250 Bid, & MUCINEX 1-2Bid...  Long hx severe bronchiectasis, granulomatous lung dis, and pneumonia (initially Dx 1968 by DrFrazier, subseq bx= noncaseating gran dz & chr inflam)... her last bronchoscopy was 4/04 revealing some mucus plugging... CTChest 4/04 showed marked bronchiectasis in lingular, LLL, RML, and mucus plugging... CT repeated 6/06- similar. ~  Sputum cultures 5/08 grew Serratia (R to amox/ceph, S to all others), branching part acid fast bacilli resembling nocardia, yeast c/w candida... ~  CXR 1/09 chr lung dis, no change from prev w/ left mid lung zone as the worst area... ~  CXR 3/10 w/ chr lung changes and NAD suspected... ~  Sputum 8/10 showed NTF only & AFB smears & cult all neg... ~  CXR 3/11 showed chr lung dis, no acute changes, stable... ~  CXR 3/12 showed chronic changes, scarring, NAD..Marland Kitchen ~  CXR 3/13 showed essentially no  change> normal heart size, prom pulm arts, bilat interstitial prominence & nodularity, bronchiectasis, NAD, surg clips from prev left breast ca surg. ~  9/13: she presents w/ an acute exac w/ cough, thick sput, congestion, fever, SOB, & chest discomfort; CXR shows worse left mid zone opacity; We decided to treat w/ empiric Levaquin x10d pending cult report==> ret w/ resist Pseudomonas, but pt states better/ improved on the Levaquin. ~  Sputum C&S 4/14 showed Pseudomonas sens to Cefepime, Tressie Ellis, Cipro, Levaquin, etc ~  3/15 & 9/15: on Advair250Bid (not taking regularly) & GFN 1200Bid; mod cough, grey colored sput, intermit hemoptysis, denies f/c/s; last pos culture was 4/14= Pseudomonas, pan-sens & treated w/ Cipro. ~  CXR 3/15 showed stable film w/ scarring LUL, no change and surg clips left breast & axilla... ~  PFTs 9/15 showed FVC= 2.08 (77%), FEV1= 1.22  (61%), %1sec= 59%, mid-flows= 31% predicted... c/w GOLD Stage2 COPD, pt GroupB... ~  3/16: on Advair250Bid & GFN 1200Bid (asked to take regularly); PFT 9/15 w/ GOLD stage2-B COPD; mod cough, grey colored sput, intermit hemoptysis, denies f/c/s; last pos culture was 4/14= Pseudomonas, pan-sens & treated w/ Cipro at that time; she does regular ChestPT... ~  CXR 3/16 is stable- chronic changes on left, clips from prev left breast surg, DDD in Tspine ~  9/16: she is reminded to use the Advair250Bid & the Mucinex600-2Bid w/ fluids and maintain a vigorous chest PT & postural drainage regimen...  PALPITATIONS >> followed by Barton Fanny on Atenolol25 & improved... ~  2DEcho 3/15 showed norm LV size & function w/ EF=55-60% and no regional wall motion abn, mild MR, mild TR, PAsys=5mHg... ~  9/16: she reminds me that daughter does not want her on Atenolol; DrNishan ordered CardizemCD240 but she "only wants low dose meds"; therefore try CardizemCD120/d... ~  10/16: pt returns w/ extensive BP records on her CardizemCD120/d; BP today= 132/74 but BP at home up to 160/100+ and we decided to add LOSATAN '50mg'$ /d to her regimen...   HYPERCHOLESTEROLEMIA, BORDERLINE (ICD-272.4) - on diet alone... ~  FLP 9/10 showed TChol 174, TG 64, HDL 49, LDL 112 ~  FLP 9/11 showed TChol 175, TG 61, HDL 48, LDL 115 ~  FLP 3/13 showed TChol 176, TG 69, HDL 64, LDL 98 ~  FLP 9/13 showed TChol 169, TG 65, HDL 48, LDL 108 ~  FLP 3/14 showed TChol 157, TG 54, HDL 46, LDL 100 ~  FLP 3/15 showed TChol 172, TG 50, HDL 56, LDL 106 ~  FLP 3/16 showed TChol 163, TG 70, HDL 53, LDL 96  Hx of ADENOCARCINOMA, BREAST (ICD-174.9) - [facility] (stage II 1.0  cm tumor), w/ post op XRT and chemotherapy... [facility] then DrLivesay (released in 2003). ~  routine Mammogram 2/10 was neg- f/u planned 14yr~  f/u mammogram 2/11 = neg, NAD, f/u  planned 1y144yr  f/u mammogram 2/12 = neg, NAD, f/u 44yr44yr f/u mammogram 2/13 = neg, NAD... ~Marland Kitchen f/u mammogram 2/14 = neg, scat parenchymal densities... ~  [facility]... ~Marland Kitchen [facility]....  DIVERTICULOSIS OF COLON (ICD-562.10) - colonoscopy 3/05 by DrPatterson showed divertics only... she has +fam hx colon ca w/ colon check q5yrs57yrf/u colonoscopy 5/11 showed +divertics, no polyps... ~  3/12:  Hx, exam & CTAbd c/w diverticulitis; treated w/ Cipro/ Flagyl & resolved;  Advised Metamucil, Miralax, etc. ~  11/12:  She had f/u DrPatterson after another bout of diverticulitis; he notes regular BMs, good appetite, stable weight; he wanted her to STOP Naprosyn, ok to use Tylenol/ Tramadol, rec hi fiber, saw the [place] movie. ~  She had a routine f/u colonoscopy 08/08/14 by DrPyrtle> +FamHx colon cancer in father; 2 sessile polyps removed, 3-77m size, mod diverticulosis, Path= tubular adenomas...  DEGENERATIVE JOINT DISEASE (ICD-715.90) - see Ortho eval 2010 by DrDaldorf... she takes Glucosamine & Herbal supplements- milk thistle/ tumeric which she says really helps... ~  4/14: she saw DrKarlFields for SportsMed> c/o bilat knee pain, known arthritis, uses body helix knee (compression) sleeves & Tramadol prn; takes tart red cherry oil daily & he rec devil's claw, tumeric, & curcumin... ~  3/15: she continues to f/u w/ drFields for knee shots periodically... ~  3/16: she is requesting name of Ortho for p[place].. ~  She is managed by DrFields and DrOlin => told she needs TKRs & we will send medical clearance...  LOW BACK PAIN SYNDROME (ICD-724.2) - [facility] showed herniated disc L3-4 w/ mod spinal stenosis and lat recess stenosis bilat...   ROTATOR CUFF SYNDROME, RIGHT (ICD-726.10) - [facility].  OSTEOPENIA (ICD-733.90) - [facility],  norm hip, osteopenic forearm (-2.0)... followed by GYN = DrRomaine who is treating a low Vit D level... she takes calcium, Vitamins, & Vit D 1000u daily from [place].. ~  labs 9/10 on 50K weekly showed Vit D level = 42... rec> change to 1000 u daily. ~  labs 9/11 on 1000 u daily showed Vit D level = 34... rec> continue daily supplement. ~  Labs 3/13 showed Vit D level = 35... Continue supplements. ~  Labs 3/14 showed Vit D level = 31 ~  Labs 3/15 showed Vit D level = 49... Continue supplements  Hx of ANEMIA (ICD-285.9) -  resolved... ~  labs 9/10 showed Hg= 13.6 ~  labs 9/11 showed Hg= 13.2 ~  Labs 3/13 showed Hg= 13.4 ~  Labs 3/14 showed Hg= 13.3 ~  Labs 3/15 showed Hg= 13.4 ~  Labs 3/16 showed Hg= 13.1  ECZEMA - she has hx of eczema on her hands and has used CoQ10 and Borage Oil as rec by an hAdministratorin PAlicia.. recently [facility] to [facility] instead of these other supplements & rash is much improved...   Past Surgical History:  Procedure Laterality Date  . COLONOSCOPY    . left breast lumpectomy and LN dissection  06/1992   Dr. NLucia Gaskins . LUNG BIOPSY  1968   TSurg---Dr. FMare Ferrari . LUNG BIOPSY  2012   dr nLenna Gilford . REVERSE SHOULDER ARTHROPLASTY Right 08/02/2015   Procedure: RIGHT REVERSE TOTAL SHOULDER ARTHROPLASTY;  Surgeon: SNetta Cedars MD;  [facility]  Service: Orthopedics;  Laterality: Right;  . REVERSE TOTAL SHOULDER ARTHROPLASTY Right 08/02/2015  . TONSILLECTOMY    . TOTAL KNEE ARTHROPLASTY Right 01/21/2015   Procedure: TOTAL RIGHT KNEE ARTHROPLASTY;  Surgeon: MParalee Cancel MD;  Location: [facility];  Service: Orthopedics;  Laterality: Right;    Outpatient Encounter Prescriptions as of 02/03/2016  Medication Sig Dispense Refill  . acetaminophen (TYLENOL) 500 MG tablet Take 500 mg by mouth every 6 (six) hours as needed.    . Calcium Carb-Cholecalciferol 600-800 MG-UNIT TABS Take 1 tablet by mouth daily.    . cholecalciferol (VITAMIN  D) 1000 UNITS tablet Take 1,000  Units by mouth daily.    Marland Kitchen diltiazem (CARDIZEM CD) 120 MG 24 hr capsule TAKE ONE CAPSULE BY MOUTH DAILY 30 capsule 5  . ferrous sulfate 325 (65 FE) MG tablet Take 1 tablet (325 mg total) by mouth 3 (three) times daily after meals.  3  . Fluticasone-Salmeterol (ADVAIR) 250-50 MCG/DOSE AEPB Inhale 1 puff into the lungs daily.    Marland Kitchen guaiFENesin (MUCINEX) 600 MG 12 hr tablet Take 1,200 mg by mouth 2 (two) times daily.    Marland Kitchen losartan (COZAAR) 50 MG tablet TAKE 1 TABLET EVERY DAY 30 tablet 5  . Multiple Vitamins-Minerals (MULTIVITAMIN & MINERAL PO) Take 1 tablet by mouth daily.      . Probiotic Product (ALIGN PO) Take 1 tablet by mouth daily.     Miranda Thompson Abbe 10 MCG TABS vaginal tablet PLACE 1 TABLET VAGINALLY TWICE A WEEK 24 tablet 4  . [DISCONTINUED] levofloxacin (LEVAQUIN) 750 MG tablet Take 1 tablet (750 mg total) by mouth daily. Take with OTC probiotic or yogurt daily. 10 tablet 0   No facility-administered encounter medications on file as of 02/03/2016.     No Known Allergies   Current Medications, Allergies, Past Medical History, Past Surgical History, Family History, and Social History were reviewed in [employer] record.     Review of Systems         See HPI - all other systems neg except as noted...  The patient complains of dyspnea on exertion and abdominal pain.  The patient denies anorexia, fever, weight loss, weight gain, vision loss, decreased hearing, hoarseness, chest pain, syncope, peripheral edema, prolonged cough, headaches, hemoptysis, melena, hematochezia, severe indigestion/heartburn, hematuria, incontinence, muscle weakness, suspicious skin lesions, transient blindness, difficulty walking, depression, unusual weight change, abnormal bleeding, enlarged lymph nodes, and angioedema.     Objective:   Physical Exam     WD, WN, 82 y/o WF in NAD... GENERAL:  Alert & oriented; pleasant & cooperative... HEENT:  [place]/AT, EOM-wnl, PERRLA, EACs-clear, TMs-wnl,  NOSE-clear, THROAT-clear & wnl. NECK:  Supple w/ fairROM; no JVD; normal carotid impulses w/o bruits; no thyromegaly or nodules palpated; no lymphadenopathy. CHEST:  scat bilat rhonchi/ congestion without wheezing, rales or signs of consolidation... HEART:  Regular Rhythm; without murmurs/ rubs/ or gallops heard... ABDOMEN:  Soft w/ mild tender LLQ; normal bowel sounds; no organomegaly or masses detected. EXT: without deformities or arthritic changes; no varicose veins/ venous insuffic/ or edema. NEURO:  CN's intact;  no focal neuro deficits... DERM:  No lesions noted; no rash etc...  RADIOLOGY DATA:  Reviewed in the EPIC EMR & discussed w/ the patient...  LABORATORY DATA:  Reviewed in the EPIC EMR & discussed w/ the patient...    Assessment & Plan:    COPD/ Bronchiectasis/ Hx pneumonia>  On Advair250, Mucinex2Bid, Fluids & vigorous Chest PT/ postural drainage... 9/13> treating acute exac w/ Levaquin x10d; Cult grew a resist Pseudomonas but she reports clinically improved, therefore continue present Rx & follow...  Repeat Sput C&S 4/14 showed pansens Pseudomonas... Baseline CXRs showed CXR 3/15 & 3/16 showed scarring LUL, no change, and surg clips left breast & axilla... PFTs 9/15 showed FVC= 2.08 (77%), FEV1= 1.22 (61%), %1sec= 59%, mid-flows= 31% predicted... c/w GOLD Stage2 COPD, pt GroupB... REMINDED TO TAKE MEDS REGULARLY & MAINTAIN A VIGOROUS REGIMEN OF CHEST PT & POSTURAL DRAINAGE... 07/2015> she developed a bronchiectasis exac after R shoulder surg when she was no longer able to do her chest Pt &  postural drain=> cult grew pseudomonas sens to cipro=> treated w/ this + home NEB using DUONEB Tid... 02/03/16>  It would be additionally beneficial for her to be on a NEB e/ med Tid=Qid before her chest PT/ postural drainage treatments but she is quite sure she cannot tolerate the neb rx;  Her orthopedic issues mount & every surgery/ every general anesthetic takes a toll;  Rec to continue  Advair250Bid, Mucinex 1200Bid, fluids, and the chest Rx.   HBP & palpit (PACs)>  followed by Cherly Hensen; on CardizemCD120 & she does not want BBlockers, & only wants "low-dose" meds... 10/16>   BP at home variable w/ 160/100+ episodes so we decided to add low dose LOSARTAN '50mg'$ /d... 1/17>   BP much better w/ the Losar50 added- continue same...   CHOL>  On diet alone & f/u FLP is at goal...  Breast Cancer>  S/p surg by [facility] & oncology f/u by DrNeijstrom, then [place] 2003 & released; yearly mammograms & monthly self exams were wnl...  Divertics>  She had acute diverticulitis 3/12 & 10/12> resolved w/ diet adjust & Cipro/Flagyl, now back to baseline & advised re- Metamucil, Fiberall, etc; she saw DrPatterson 11/12.  DJD/ LBP>  Ortho is DrOlin & DrFields; she takes Glucosamine & Herbs;  11/16>   s/p right TKR 12/2014 by DrOlin 6/17>   sched for right total shoulder replacement by DrNorris  Osteopenia/ Vit D defic>  On Calcium, MVI, Vit D supplement; followed by GYN= DrRomaine...  Other medical issues as noted...    Patient's Medications  New Prescriptions   No medications on file  Previous Medications   ACETAMINOPHEN (TYLENOL) 500 MG TABLET    Take 500 mg by mouth every 6 (six) hours as needed.   CALCIUM CARB-CHOLECALCIFEROL 600-800 MG-UNIT TABS    Take 1 tablet by mouth daily.   CHOLECALCIFEROL (VITAMIN D) 1000 UNITS TABLET    Take 1,000 Units by mouth daily.   DILTIAZEM (CARDIZEM CD) 120 MG 24 HR CAPSULE    TAKE ONE CAPSULE BY MOUTH DAILY   FERROUS SULFATE 325 (65 FE) MG TABLET    Take 1 tablet (325 mg total) by mouth 3 (three) times daily after meals.   FLUTICASONE-SALMETEROL (ADVAIR) 250-50 MCG/DOSE AEPB    Inhale 1 puff into the lungs daily.   GUAIFENESIN (MUCINEX) 600 MG 12 HR TABLET    Take 1,200 mg by mouth 2 (two) times daily.   LOSARTAN (COZAAR) 50 MG TABLET    TAKE 1 TABLET EVERY DAY   MULTIPLE VITAMINS-MINERALS (MULTIVITAMIN & MINERAL PO)    Take 1 tablet by mouth  daily.     PROBIOTIC PRODUCT (ALIGN PO)    Take 1 tablet by mouth daily.    YUVAFEM 10 MCG TABS VAGINAL TABLET    PLACE 1 TABLET VAGINALLY TWICE A WEEK  Modified Medications   No medications on file  Discontinued Medications   LEVOFLOXACIN (LEVAQUIN) 750 MG TABLET    Take 1 tablet (750 mg total) by mouth daily. Take with OTC probiotic or yogurt daily.

## 2016-03-06 NOTE — H&P (Signed)
TOTAL KNEE ADMISSION H&P  Patient is being admitted for left total knee arthroplasty.  Subjective:  Chief Complaint:   Left knee primary OA / pain  HPI: Miranda Thompson, 83 y.o. female, has a history of pain and functional disability in the left knee due to arthritis and has failed non-surgical conservative treatments for greater than 12 weeks to include NSAID's and/or analgesics, corticosteriod injections, use of assistive devices and activity modification.  Onset of symptoms was gradual, starting 40+ years ago with gradually worsening course since that time. The patient noted prior procedures on the knee to include  arthroplasty on the right knee(s).  Patient currently rates pain in the left knee(s) at 7 out of 10 with activity. Patient has worsening of pain with activity and weight bearing, pain that interferes with activities of daily living, pain with passive range of motion, crepitus and joint swelling.  Patient has evidence of periarticular osteophytes and joint space narrowing by imaging studies.  There is no active infection.   Risks, benefits and expectations were discussed with the patient.  Risks including but not limited to the risk of anesthesia, blood clots, nerve damage, blood vessel damage, failure of the prosthesis, infection and up to and including death.  Patient understand the risks, benefits and expectations and wishes to proceed with surgery.   PCP: Noralee Space, MD  D/C Plans:       Home   Post-op Meds:       No Rx given   Tranexamic Acid:      To be given - IV   Decadron:      Is to be given  FYI:     ASA  Norco    Patient Active Problem List   Diagnosis Date Noted  . S/P shoulder replacement 08/02/2015  . S/P right TKA 01/21/2015  . S/P knee replacement 01/21/2015  . Palpitations 08/20/2014  . Episode of hypertension 08/20/2014  . COPD, moderate ([place]) 05/09/2014  . Premature atrial contractions 11/08/2013  . Cardiac arrhythmia 05/10/2013  . Trochanteric  bursitis of left hip 02/02/2012  . Osteoarthritis of left hip 06/25/2011  . Leg length inequality 06/25/2011  . Other secondary osteoarthritis of both knees 06/25/2011  . Diverticular disease of colon 01/20/2011  . Left ovarian cyst 01/20/2011  . DIVERTICULITIS, ACUTE 05/05/2010  . Elevated lipids 11/04/2009  . ACUTE CYSTITIS 11/04/2009  . LOW BACK PAIN SYNDROME 05/07/2008  . BRONCHIECTASIS WITH ACUTE EXACERBATION 03/18/2007  . Hemoptysis 03/18/2007  . Malignant neoplasm of female breast ([place]) 03/07/2007  . PNEUMONIA 03/07/2007  . Diverticulosis of large intestine 03/07/2007  . ROTATOR CUFF SYNDROME, RIGHT 03/07/2007  . Disorder of bone and cartilage 03/07/2007  . ANEMIA 03/04/2007  . ALLERGIC RHINITIS 03/04/2007  . BRONCHIECTASIS 03/04/2007  . Osteoarthritis 03/04/2007   Past Medical History:  Diagnosis Date  . Allergic rhinitis   . Anemia   . Atrial fibrillation ([place])    goes in and out of this rhythm- takes Tenormin  . Breast cancer, left breast ([place]) 1994 with lumpectomy   chemo,tamox  . Bronchiectasis with acute exacerbation ([place])   . COPD (chronic obstructive pulmonary disease) ([place])   . Diverticulosis of colon   . DJD (degenerative joint disease)   . Dysrhythmia    palpitations  . Family history of malignant neoplasm of gastrointestinal tract   . Fibromyalgia   . Hemoptysis   . History of pneumonia   . Hypercholesteremia   . Hypertension   . Low back pain syndrome   .  Osteopenia   . Pneumonia    hx  . Pseudomonas infection   . Rotator cuff syndrome of right shoulder   . Spinal stenosis     Past Surgical History:  Procedure Laterality Date  . COLONOSCOPY    . left breast lumpectomy and LN dissection  06/1992   Dr. [name]  . LUNG BIOPSY  1968   TSurg---Dr. [name]  . LUNG BIOPSY  2012   dr [name]  . REVERSE SHOULDER ARTHROPLASTY Right 08/02/2015   Procedure: RIGHT REVERSE TOTAL SHOULDER ARTHROPLASTY;  Surgeon: [name], MD;  Location: [place];  Service:  Orthopedics;  Laterality: Right;  . REVERSE TOTAL SHOULDER ARTHROPLASTY Right 08/02/2015  . TONSILLECTOMY    . TOTAL KNEE ARTHROPLASTY Right 01/21/2015   Procedure: TOTAL RIGHT KNEE ARTHROPLASTY;  Surgeon: [name], MD;  Location: [facility];  Service: Orthopedics;  Laterality: Right;    No prescriptions prior to admission.   No Known Allergies   Social History  Substance Use Topics  . Smoking status: Former Smoker    Packs/day: 0.25    Years: 40.00    Types: Cigarettes    Quit date: 02/24/1967  . Smokeless tobacco: Never Used  . Alcohol use 0.6 oz/week    1 Standard drinks or equivalent per week     Comment: occasionally    Family History  Problem Relation Age of Onset  . Heart attack Mother     Deceased age 13  . Colon cancer Father     deceased age 20 from colon ca.  . Hypertension Brother   . Esophageal cancer Neg Hx   . Stomach cancer Neg Hx   . Rectal cancer Neg Hx      Review of Systems  Constitutional: Negative.   HENT: Negative.   Eyes: Negative.   Respiratory: Negative.   Cardiovascular: Negative.   Gastrointestinal: Negative.   Genitourinary: Negative.   Musculoskeletal: Positive for back pain and joint pain.  Skin: Negative.   Neurological: Negative.   Endo/Heme/Allergies: Positive for environmental allergies.  Psychiatric/Behavioral: Negative.     Objective:  Physical Exam  Constitutional: She is oriented to person, place, and time. She appears well-developed.  HENT:  Head: Normocephalic.  Eyes: Pupils are equal, round, and reactive to light.  Neck: Neck supple. No JVD present. No tracheal deviation present. No thyromegaly present.  Cardiovascular: Normal rate, regular rhythm, normal heart sounds and intact distal pulses.   Respiratory: Effort normal and breath sounds normal. No stridor. No respiratory distress. She has no wheezes.  GI: Soft. There is no tenderness. There is no guarding.  Musculoskeletal:       Left knee: She exhibits decreased  range of motion, swelling, abnormal alignment (significant Varus deformity) and bony tenderness. She exhibits no ecchymosis, no deformity, no laceration and no erythema. Tenderness found.  Lymphadenopathy:    She has no cervical adenopathy.  Neurological: She is alert and oriented to person, place, and time.  Skin: Skin is warm and dry.  Psychiatric: She has a normal mood and affect.      Labs:  Estimated body mass index is 24.06 kg/m as calculated from the following:   Height as of 02/03/16: 5\' 3"  (1.6 m).   Weight as of 02/03/16: 61.6 kg (135 lb 12.8 oz).   Imaging Review The x-rays are over a year old, but do still show significant changes that would warrant more definitive treatment.  Plain radiographs demonstrate severe degenerative joint disease of the left knee, severe joint space narrowing  with bone-on-bone changes of the medial compartment.  Lateral compartment has some joint space, but spurring of the femur as well as chondrocalcinosis.  The overall alignment is significant varus. The bone quality appears to be good for age and reported activity level.  Assessment/Plan:  End stage arthritis, left knee.  The patient history, physical examination, clinical judgment of the provider and imaging studies are consistent with end stage degenerative joint disease of the left kne and total knee arthroplasty is deemed medically necessary. The treatment options including medical management, injection therapy arthroscopy and arthroplasty were discussed at length. The risks and benefits of total knee arthroplasty were presented and reviewed. The risks due to aseptic loosening, infection, stiffness, patella tracking problems, thromboembolic complications and other imponderables were discussed. The patient acknowledged the explanation, agreed to proceed with the plan and consent was signed. Patient is being admitted for inpatient treatment for surgery, pain control, PT, OT, prophylactic  antibiotics, VTE prophylaxis, progressive ambulation and ADL's and discharge planning. The patient is planning to be discharged home.     [place] Milynn Quirion   PA-C  03/06/2016, 12:57 PM

## 2016-03-16 NOTE — Patient Instructions (Signed)
Miranda Thompson  03/16/2016   Your procedure is scheduled on: 03/23/16  Report to [facility] Main  Entrance take [place]  elevators to 3rd floor to  [place] at    [place] AM.  Call this number if you have problems the morning of surgery [phone]   Remember: ONLY 1 PERSON MAY GO WITH YOU TO SHORT STAY TO GET  READY MORNING OF [place].  Do not eat food or drink liquids :After Midnight.     Take these medicines the morning of surgery with A SIP OF WATER:     Diltiazem(cardizem) , Advair and bring , mucinex                You may not have any metal on your body including hair pins and              piercings  Do not wear jewelry, make-up, lotions, powders or perfumes, deodorant             Do not wear nail polish.  Do not shave  48 hours prior to surgery.              Do not bring valuables to the hospital. [place].  Contacts, dentures or bridgework may not be worn into surgery.  Leave suitcase in the car. After surgery it may be brought to your room.                 Please read over the following fact sheets you were given: _____________________________________________________________________             [facility] - Preparing for Surgery Before surgery, you can play an important role.  Because skin is not sterile, your skin needs to be as free of germs as possible.  You can reduce the number of germs on your skin by washing with CHG (chlorahexidine gluconate) soap before surgery.  CHG is an antiseptic cleaner which kills germs and bonds with the skin to continue killing germs even after washing. Please DO NOT use if you have an allergy to CHG or antibacterial soaps.  If your skin becomes reddened/irritated stop using the CHG and inform your nurse when you arrive at Short Stay. Do not shave (including legs and underarms) for at least 48 hours prior to the first CHG shower.  You may shave your  face/neck. Please follow these instructions carefully:  1.  Shower with CHG Soap the night before surgery and the  morning of Surgery.  2.  If you choose to wash your hair, wash your hair first as usual with your  normal  shampoo.  3.  After you shampoo, rinse your hair and body thoroughly to remove the  shampoo.                           4.  Use CHG as you would any other liquid soap.  You can apply chg directly  to the skin and wash                       Gently with a scrungie or clean washcloth.  5.  Apply the CHG Soap to your body ONLY FROM THE NECK DOWN.   Do not  use on face/ open                           Wound or open sores. Avoid contact with eyes, ears mouth and genitals (private parts).                       Wash face,  Genitals (private parts) with your normal soap.             6.  Wash thoroughly, paying special attention to the area where your surgery  will be performed.  7.  Thoroughly rinse your body with warm water from the neck down.  8.  DO NOT shower/wash with your normal soap after using and rinsing off  the CHG Soap.                9.  Pat yourself dry with a clean towel.            10.  Wear clean pajamas.            11.  Place clean sheets on your bed the night of your first shower and do not  sleep with pets. Day of Surgery : Do not apply any lotions/deodorants the morning of surgery.  Please wear clean clothes to the hospital/surgery center.  FAILURE TO FOLLOW THESE INSTRUCTIONS MAY RESULT IN THE CANCELLATION OF YOUR SURGERY PATIENT SIGNATURE_________________________________  NURSE SIGNATURE__________________________________  ________________________________________________________________________  WHAT IS A BLOOD TRANSFUSION? Blood Transfusion Information  A transfusion is the replacement of blood or some of its parts. Blood is made up of multiple cells which provide different functions.  Red blood cells carry oxygen and are used for blood loss  replacement.  White blood cells fight against infection.  Platelets control bleeding.  Plasma helps clot blood.  Other blood products are available for specialized needs, such as hemophilia or other clotting disorders. BEFORE THE TRANSFUSION  Who gives blood for transfusions?   Healthy volunteers who are fully evaluated to make sure their blood is safe. This is blood bank blood. Transfusion therapy is the safest it has ever been in the practice of medicine. Before blood is taken from a donor, a complete history is taken to make sure that person has no history of diseases nor engages in risky social behavior (examples are intravenous drug use or sexual activity with multiple partners). The donor's travel history is screened to minimize risk of transmitting infections, such as malaria. The donated blood is tested for signs of infectious diseases, such as HIV and hepatitis. The blood is then tested to be sure it is compatible with you in order to minimize the chance of a transfusion reaction. If you or a relative donates blood, this is often done in anticipation of surgery and is not appropriate for emergency situations. It takes many days to process the donated blood. RISKS AND COMPLICATIONS Although transfusion therapy is very safe and saves many lives, the main dangers of transfusion include:   Getting an infectious disease.  Developing a transfusion reaction. This is an allergic reaction to something in the blood you were given. Every precaution is taken to prevent this. The decision to have a blood transfusion has been considered carefully by your caregiver before blood is given. Blood is not given unless the benefits outweigh the risks. AFTER THE TRANSFUSION  Right after receiving a blood transfusion, you will usually feel much better and more energetic. This is especially true if  your red blood cells have gotten low (anemic). The transfusion raises the level of the red blood cells which  carry oxygen, and this usually causes an energy increase.  The nurse administering the transfusion will monitor you carefully for complications. HOME CARE INSTRUCTIONS  No special instructions are needed after a transfusion. You may find your energy is better. Speak with your caregiver about any limitations on activity for underlying diseases you may have. SEEK MEDICAL CARE IF:   Your condition is not improving after your transfusion.  You develop redness or irritation at the intravenous (IV) site. SEEK IMMEDIATE MEDICAL CARE IF:  Any of the following symptoms occur over the next 12 hours:  Shaking chills.  You have a temperature by mouth above 102 F (38.9 C), not controlled by medicine.  Chest, back, or muscle pain.  People around you feel you are not acting correctly or are confused.  Shortness of breath or difficulty breathing.  Dizziness and fainting.  You get a rash or develop hives.  You have a decrease in urine output.  Your urine turns a dark color or changes to pink, red, or brown. Any of the following symptoms occur over the next 10 days:  You have a temperature by mouth above 102 F (38.9 C), not controlled by medicine.  Shortness of breath.  Weakness after normal activity.  The white part of the eye turns yellow (jaundice).  You have a decrease in the amount of urine or are urinating less often.  Your urine turns a dark color or changes to pink, red, or brown. Document Released: 02/07/2000 Document Revised: 05/04/2011 Document Reviewed: 09/26/2007 ExitCare Patient Information 2014 [place].  _______________________________________________________________________  Incentive Spirometer  An incentive spirometer is a tool that can help keep your lungs clear and active. This tool measures how well you are filling your lungs with each breath. Taking long deep breaths may help reverse or decrease the chance of developing breathing (pulmonary) problems  (especially infection) following:  A long period of time when you are unable to move or be active. BEFORE THE PROCEDURE   If the spirometer includes an indicator to show your best effort, your nurse or respiratory therapist will set it to a desired goal.  If possible, sit up straight or lean slightly forward. Try not to slouch.  Hold the incentive spirometer in an upright position. INSTRUCTIONS FOR USE  1. Sit on the edge of your bed if possible, or sit up as far as you can in bed or on a chair. 2. Hold the incentive spirometer in an upright position. 3. Breathe out normally. 4. Place the mouthpiece in your mouth and seal your lips tightly around it. 5. Breathe in slowly and as deeply as possible, raising the piston or the ball toward the top of the column. 6. Hold your breath for 3-5 seconds or for as long as possible. Allow the piston or ball to fall to the bottom of the column. 7. Remove the mouthpiece from your mouth and breathe out normally. 8. Rest for a few seconds and repeat Steps 1 through 7 at least 10 times every 1-2 hours when you are awake. Take your time and take a few normal breaths between deep breaths. 9. The spirometer may include an indicator to show your best effort. Use the indicator as a goal to work toward during each repetition. 10. After each set of 10 deep breaths, practice coughing to be sure your lungs are clear. If you have an incision (  the cut made at the time of surgery), support your incision when coughing by placing a pillow or rolled up towels firmly against it. Once you are able to get out of bed, walk around indoors and cough well. You may stop using the incentive spirometer when instructed by your caregiver.  RISKS AND COMPLICATIONS  Take your time so you do not get dizzy or light-headed.  If you are in pain, you may need to take or ask for pain medication before doing incentive spirometry. It is harder to take a deep breath if you are having  pain. AFTER USE  Rest and breathe slowly and easily.  It can be helpful to keep track of a log of your progress. Your caregiver can provide you with a simple table to help with this. If you are using the spirometer at home, follow these instructions: [place] IF:   You are having difficultly using the spirometer.  You have trouble using the spirometer as often as instructed.  Your pain medication is not giving enough relief while using the spirometer.  You develop fever of 100.5 F (38.1 C) or higher. SEEK IMMEDIATE MEDICAL CARE IF:   You cough up bloody sputum that had not been present before.  You develop fever of 102 F (38.9 C) or greater.  You develop worsening pain at or near the incision site. MAKE SURE YOU:   Understand these instructions.  Will watch your condition.  Will get help right away if you are not doing well or get worse. Document Released: 06/22/2006 Document Revised: 05/04/2011 Document Reviewed: 08/23/2006 [facility] Patient Information 2014 [place], [place].   ________________________________________________________________________

## 2016-03-18 ENCOUNTER — Encounter (HOSPITAL_COMMUNITY): Payer: Self-pay

## 2016-03-18 ENCOUNTER — Encounter (HOSPITAL_COMMUNITY)
Admission: RE | Admit: 2016-03-18 | Discharge: 2016-03-18 | Disposition: A | Discharge status: Home / Self Care | Payer: Medicare Other | Source: Ambulatory Visit | Attending: Orthopedic Surgery | Admitting: Orthopedic Surgery

## 2016-03-18 DIAGNOSIS — R002 Palpitations: Secondary | ICD-10-CM | POA: Diagnosis not present

## 2016-03-18 DIAGNOSIS — M949 Disorder of cartilage, unspecified: Secondary | ICD-10-CM | POA: Diagnosis not present

## 2016-03-18 DIAGNOSIS — M545 Low back pain: Secondary | ICD-10-CM | POA: Insufficient documentation

## 2016-03-18 DIAGNOSIS — J189 Pneumonia, unspecified organism: Secondary | ICD-10-CM | POA: Diagnosis not present

## 2016-03-18 DIAGNOSIS — I499 Cardiac arrhythmia, unspecified: Secondary | ICD-10-CM | POA: Diagnosis not present

## 2016-03-18 DIAGNOSIS — D649 Anemia, unspecified: Secondary | ICD-10-CM | POA: Insufficient documentation

## 2016-03-18 DIAGNOSIS — J309 Allergic rhinitis, unspecified: Secondary | ICD-10-CM | POA: Diagnosis not present

## 2016-03-18 DIAGNOSIS — I252 Old myocardial infarction: Secondary | ICD-10-CM | POA: Diagnosis not present

## 2016-03-18 DIAGNOSIS — E785 Hyperlipidemia, unspecified: Secondary | ICD-10-CM | POA: Insufficient documentation

## 2016-03-18 DIAGNOSIS — J449 Chronic obstructive pulmonary disease, unspecified: Secondary | ICD-10-CM | POA: Insufficient documentation

## 2016-03-18 DIAGNOSIS — J471 Bronchiectasis with (acute) exacerbation: Secondary | ICD-10-CM | POA: Diagnosis not present

## 2016-03-18 DIAGNOSIS — I517 Cardiomegaly: Secondary | ICD-10-CM | POA: Insufficient documentation

## 2016-03-18 DIAGNOSIS — Z01810 Encounter for preprocedural cardiovascular examination: Secondary | ICD-10-CM | POA: Diagnosis not present

## 2016-03-18 DIAGNOSIS — Z01812 Encounter for preprocedural laboratory examination: Secondary | ICD-10-CM | POA: Diagnosis present

## 2016-03-18 DIAGNOSIS — C50919 Malignant neoplasm of unspecified site of unspecified female breast: Secondary | ICD-10-CM | POA: Insufficient documentation

## 2016-03-18 DIAGNOSIS — M174 Other bilateral secondary osteoarthritis of knee: Secondary | ICD-10-CM | POA: Diagnosis not present

## 2016-03-18 LAB — CBC
HCT: 37.1 % (ref 36.0–46.0)
Hemoglobin: 12.6 g/dL (ref 12.0–15.0)
MCH: 32.2 pg (ref 26.0–34.0)
MCHC: 34 g/dL (ref 30.0–36.0)
MCV: 94.9 fL (ref 78.0–100.0)
Platelets: 249 10*3/uL (ref 150–400)
RBC: 3.91 MIL/uL (ref 3.87–5.11)
RDW: 12.3 % (ref 11.5–15.5)
WBC: 5 10*3/uL (ref 4.0–10.5)

## 2016-03-18 LAB — TYPE AND SCREEN
ABO/RH(D): B POS
Antibody Screen: NEGATIVE

## 2016-03-18 LAB — BASIC METABOLIC PANEL
Anion gap: 6 (ref 5–15)
BUN: 14 mg/dL (ref 6–20)
CO2: 27 mmol/L (ref 22–32)
Calcium: 8.8 mg/dL — ABNORMAL LOW (ref 8.9–10.3)
Chloride: 95 mmol/L — ABNORMAL LOW (ref 101–111)
Creatinine, Ser: 0.61 mg/dL (ref 0.44–1.00)
GFR calc Af Amer: >60 mL/min (ref >60)
GFR calc non Af Amer: >60 mL/min (ref >60)
Glucose, Bld: 94 mg/dL (ref 65–99)
Potassium: 4.8 mmol/L (ref 3.5–5.1)
Sodium: 128 mmol/L — ABNORMAL LOW (ref 135–145)

## 2016-03-18 LAB — SURGICAL PCR SCREEN
MRSA, PCR: NEGATIVE
Staphylococcus aureus: NEGATIVE

## 2016-03-18 NOTE — Progress Notes (Signed)
Card clearance 8/17 on chart LOV 12/17 pulm epic cxr 12/17 epic 10/08/15 last ov card epic

## 2016-03-18 NOTE — Progress Notes (Signed)
BMP done 03/18/16 routed via epic to Dr. Alvan Dame

## 2016-03-23 ENCOUNTER — Encounter (HOSPITAL_COMMUNITY)
Admission: RE | Disposition: A | Discharge status: Home Health | Payer: Self-pay | Source: Ambulatory Visit | Attending: Orthopedic Surgery

## 2016-03-23 ENCOUNTER — Inpatient Hospital Stay (HOSPITAL_COMMUNITY): Payer: Medicare Other | Admitting: Certified Registered"

## 2016-03-23 ENCOUNTER — Inpatient Hospital Stay (HOSPITAL_COMMUNITY)
Admission: RE | Admit: 2016-03-23 | Discharge: 2016-03-24 | DRG: 470 | Disposition: A | Discharge status: Home Health | Payer: Medicare Other | Source: Ambulatory Visit | Attending: Orthopedic Surgery | Admitting: Orthopedic Surgery

## 2016-03-23 ENCOUNTER — Encounter (HOSPITAL_COMMUNITY): Payer: Self-pay | Admitting: *Deleted

## 2016-03-23 DIAGNOSIS — E7800 Pure hypercholesterolemia, unspecified: Secondary | ICD-10-CM | POA: Diagnosis present

## 2016-03-23 DIAGNOSIS — D649 Anemia, unspecified: Secondary | ICD-10-CM | POA: Diagnosis present

## 2016-03-23 DIAGNOSIS — Z853 Personal history of malignant neoplasm of breast: Secondary | ICD-10-CM

## 2016-03-23 DIAGNOSIS — M797 Fibromyalgia: Secondary | ICD-10-CM | POA: Diagnosis present

## 2016-03-23 DIAGNOSIS — M1712 Unilateral primary osteoarthritis, left knee: Principal | ICD-10-CM | POA: Diagnosis present

## 2016-03-23 DIAGNOSIS — I4891 Unspecified atrial fibrillation: Secondary | ICD-10-CM | POA: Diagnosis present

## 2016-03-23 DIAGNOSIS — Z87891 Personal history of nicotine dependence: Secondary | ICD-10-CM

## 2016-03-23 DIAGNOSIS — Z96611 Presence of right artificial shoulder joint: Secondary | ICD-10-CM | POA: Diagnosis present

## 2016-03-23 DIAGNOSIS — Z96659 Presence of unspecified artificial knee joint: Secondary | ICD-10-CM

## 2016-03-23 DIAGNOSIS — M170 Bilateral primary osteoarthritis of knee: Secondary | ICD-10-CM | POA: Diagnosis present

## 2016-03-23 DIAGNOSIS — I10 Essential (primary) hypertension: Secondary | ICD-10-CM | POA: Diagnosis present

## 2016-03-23 DIAGNOSIS — M659 Synovitis and tenosynovitis, unspecified: Secondary | ICD-10-CM | POA: Diagnosis present

## 2016-03-23 DIAGNOSIS — J449 Chronic obstructive pulmonary disease, unspecified: Secondary | ICD-10-CM | POA: Diagnosis present

## 2016-03-23 DIAGNOSIS — M25762 Osteophyte, left knee: Secondary | ICD-10-CM | POA: Diagnosis present

## 2016-03-23 DIAGNOSIS — Z96651 Presence of right artificial knee joint: Secondary | ICD-10-CM | POA: Diagnosis present

## 2016-03-23 HISTORY — PX: TOTAL KNEE ARTHROPLASTY: SHX125

## 2016-03-23 SURGERY — ARTHROPLASTY, KNEE, TOTAL
Anesthesia: Spinal | Site: Knee | Laterality: Left

## 2016-03-23 MED ORDER — HYDROMORPHONE HCL 1 MG/ML IJ SOLN: 0.5000 mg | INTRAMUSCULAR | Status: DC | PRN

## 2016-03-23 MED ORDER — MIDAZOLAM HCL 2 MG/2ML IJ SOLN
INTRAMUSCULAR | Status: AC
  Filled 2016-03-23: qty 2

## 2016-03-23 MED ORDER — ROPIVACAINE HCL 7.5 MG/ML IJ SOLN
INTRAMUSCULAR | Status: AC
  Filled 2016-03-23: qty 20

## 2016-03-23 MED ORDER — CEFAZOLIN SODIUM-DEXTROSE 2-4 GM/100ML-% IV SOLN
INTRAVENOUS | Status: AC
  Filled 2016-03-23: qty 100

## 2016-03-23 MED ORDER — PROPOFOL 500 MG/50ML IV EMUL
INTRAVENOUS | Status: DC | PRN
  Administered 2016-03-23: 75 ug/kg/min via INTRAVENOUS

## 2016-03-23 MED ORDER — BUPIVACAINE HCL (PF) 0.25 % IJ SOLN
INTRAMUSCULAR | Status: AC
  Filled 2016-03-23: qty 30

## 2016-03-23 MED ORDER — BISACODYL 10 MG RE SUPP: 10.0000 mg | Freq: Every day | RECTAL | Status: DC | PRN

## 2016-03-23 MED ORDER — PROPOFOL 10 MG/ML IV BOLUS
INTRAVENOUS | Status: AC
  Filled 2016-03-23: qty 40

## 2016-03-23 MED ORDER — KETOROLAC TROMETHAMINE 30 MG/ML IJ SOLN
INTRAMUSCULAR | Status: AC
  Filled 2016-03-23: qty 1

## 2016-03-23 MED ORDER — PHENYLEPHRINE HCL 10 MG/ML IJ SOLN
INTRAVENOUS | Status: DC | PRN
  Administered 2016-03-23: 10 ug/min via INTRAVENOUS

## 2016-03-23 MED ORDER — LACTATED RINGERS IV SOLN
INTRAVENOUS | Status: DC | PRN
  Administered 2016-03-23 (×2): via INTRAVENOUS

## 2016-03-23 MED ORDER — SODIUM CHLORIDE 0.9 % IV SOLN
INTRAVENOUS | Status: DC
  Administered 2016-03-23 – 2016-03-24 (×2): via INTRAVENOUS

## 2016-03-23 MED ORDER — METHOCARBAMOL 500 MG PO TABS: 500.0000 mg | ORAL_TABLET | Freq: Four times a day (QID) | ORAL | 0 refills | Status: DC | PRN

## 2016-03-23 MED ORDER — DIPHENHYDRAMINE HCL 25 MG PO CAPS: 25.0000 mg | ORAL_CAPSULE | Freq: Four times a day (QID) | ORAL | Status: DC | PRN

## 2016-03-23 MED ORDER — KETOROLAC TROMETHAMINE 30 MG/ML IJ SOLN
INTRAMUSCULAR | Status: DC | PRN
  Administered 2016-03-23: 30 mg

## 2016-03-23 MED ORDER — FLUTICASONE FUROATE-VILANTEROL 200-25 MCG/INH IN AEPB
1.0000 | INHALATION_SPRAY | Freq: Every day | RESPIRATORY_TRACT | Status: DC
  Filled 2016-03-23: qty 28

## 2016-03-23 MED ORDER — METOCLOPRAMIDE HCL 5 MG PO TABS: 5.0000 mg | ORAL_TABLET | Freq: Three times a day (TID) | ORAL | Status: DC | PRN

## 2016-03-23 MED ORDER — HYDROCODONE-ACETAMINOPHEN 7.5-325 MG PO TABS
1.0000 | ORAL_TABLET | ORAL | Status: DC
  Administered 2016-03-23: 14:00:00 1 via ORAL
  Administered 2016-03-23: 18:00:00 2 via ORAL
  Administered 2016-03-24: 10:00:00 1 via ORAL
  Administered 2016-03-24 (×2): 2 via ORAL
  Filled 2016-03-23 (×3): qty 2
  Filled 2016-03-23 (×2): qty 1

## 2016-03-23 MED ORDER — OXYCODONE HCL 5 MG PO TABS: 5.0000 mg | ORAL_TABLET | Freq: Once | ORAL | Status: DC | PRN

## 2016-03-23 MED ORDER — SODIUM CHLORIDE 0.9 % IJ SOLN
INTRAMUSCULAR | Status: AC
  Filled 2016-03-23: qty 50

## 2016-03-23 MED ORDER — PHENOL 1.4 % MT LIQD: 1.0000 | OROMUCOSAL | Status: DC | PRN

## 2016-03-23 MED ORDER — FERROUS SULFATE 325 (65 FE) MG PO TABS: 325.0000 mg | ORAL_TABLET | Freq: Three times a day (TID) | ORAL | 3 refills | Status: DC

## 2016-03-23 MED ORDER — MENTHOL 3 MG MT LOZG: 1.0000 | LOZENGE | OROMUCOSAL | Status: DC | PRN

## 2016-03-23 MED ORDER — ROPIVACAINE HCL 7.5 MG/ML IJ SOLN
INTRAMUSCULAR | Status: DC | PRN
  Administered 2016-03-23: 20 mL via PERINEURAL

## 2016-03-23 MED ORDER — CHLORHEXIDINE GLUCONATE 4 % EX LIQD: 60.0000 mL | Freq: Once | CUTANEOUS | Status: DC

## 2016-03-23 MED ORDER — DOCUSATE SODIUM 100 MG PO CAPS
100.0000 mg | ORAL_CAPSULE | Freq: Two times a day (BID) | ORAL | Status: DC
  Administered 2016-03-23 – 2016-03-24 (×2): 100 mg via ORAL
  Filled 2016-03-23 (×2): qty 1

## 2016-03-23 MED ORDER — POLYETHYLENE GLYCOL 3350 17 G PO PACK
17.0000 g | PACK | Freq: Two times a day (BID) | ORAL | Status: DC
  Administered 2016-03-23 – 2016-03-24 (×2): 17 g via ORAL
  Filled 2016-03-23 (×2): qty 1

## 2016-03-23 MED ORDER — CEFAZOLIN SODIUM-DEXTROSE 2-4 GM/100ML-% IV SOLN
2.0000 g | INTRAVENOUS | Status: AC
  Administered 2016-03-23: 2 g via INTRAVENOUS

## 2016-03-23 MED ORDER — PHENYLEPHRINE HCL 10 MG/ML IJ SOLN
INTRAMUSCULAR | Status: AC
  Filled 2016-03-23: qty 1

## 2016-03-23 MED ORDER — ALUM & MAG HYDROXIDE-SIMETH 200-200-20 MG/5ML PO SUSP: 30.0000 mL | ORAL | Status: DC | PRN

## 2016-03-23 MED ORDER — ONDANSETRON HCL 4 MG/2ML IJ SOLN: 4.0000 mg | Freq: Four times a day (QID) | INTRAMUSCULAR | Status: DC | PRN

## 2016-03-23 MED ORDER — ONDANSETRON HCL 4 MG PO TABS: 4.0000 mg | ORAL_TABLET | Freq: Four times a day (QID) | ORAL | Status: DC | PRN

## 2016-03-23 MED ORDER — ASPIRIN 81 MG PO CHEW
81.0000 mg | CHEWABLE_TABLET | Freq: Two times a day (BID) | ORAL | Status: DC
  Administered 2016-03-23 – 2016-03-24 (×2): 81 mg via ORAL
  Filled 2016-03-23 (×2): qty 1

## 2016-03-23 MED ORDER — METOCLOPRAMIDE HCL 5 MG/ML IJ SOLN: 5.0000 mg | Freq: Three times a day (TID) | INTRAMUSCULAR | Status: DC | PRN

## 2016-03-23 MED ORDER — TRANEXAMIC ACID 1000 MG/10ML IV SOLN
1000.0000 mg | INTRAVENOUS | Status: AC
  Administered 2016-03-23: 1000 mg via INTRAVENOUS
  Filled 2016-03-23: qty 1100

## 2016-03-23 MED ORDER — METHOCARBAMOL 500 MG PO TABS: 500.0000 mg | ORAL_TABLET | Freq: Four times a day (QID) | ORAL | Status: DC | PRN

## 2016-03-23 MED ORDER — DEXAMETHASONE SODIUM PHOSPHATE 10 MG/ML IJ SOLN
10.0000 mg | Freq: Once | INTRAMUSCULAR | Status: AC
Start: 2016-03-23 — End: 2016-03-23
  Administered 2016-03-23: 10 mg via INTRAVENOUS

## 2016-03-23 MED ORDER — DILTIAZEM HCL ER COATED BEADS 120 MG PO CP24
120.0000 mg | ORAL_CAPSULE | Freq: Every day | ORAL | Status: DC
  Administered 2016-03-24: 120 mg via ORAL
  Filled 2016-03-23: qty 1

## 2016-03-23 MED ORDER — ONDANSETRON HCL 4 MG/2ML IJ SOLN
INTRAMUSCULAR | Status: AC
  Filled 2016-03-23: qty 2

## 2016-03-23 MED ORDER — ASPIRIN 81 MG PO CHEW: 81.0000 mg | CHEWABLE_TABLET | Freq: Two times a day (BID) | ORAL | 0 refills | Status: AC

## 2016-03-23 MED ORDER — LOSARTAN POTASSIUM 50 MG PO TABS
50.0000 mg | ORAL_TABLET | Freq: Every day | ORAL | Status: DC
  Administered 2016-03-24: 10:00:00 50 mg via ORAL
  Filled 2016-03-23: qty 1

## 2016-03-23 MED ORDER — DEXTROSE 5 % IV SOLN
500.0000 mg | Freq: Four times a day (QID) | INTRAVENOUS | Status: DC | PRN
  Administered 2016-03-23: 500 mg via INTRAVENOUS
  Filled 2016-03-23: qty 550
  Filled 2016-03-23: qty 5

## 2016-03-23 MED ORDER — POLYETHYLENE GLYCOL 3350 17 G PO PACK: 17.0000 g | PACK | Freq: Two times a day (BID) | ORAL | 0 refills | Status: DC

## 2016-03-23 MED ORDER — DOCUSATE SODIUM 100 MG PO CAPS: 100.0000 mg | ORAL_CAPSULE | Freq: Two times a day (BID) | ORAL | 0 refills | Status: DC

## 2016-03-23 MED ORDER — CELECOXIB 200 MG PO CAPS
200.0000 mg | ORAL_CAPSULE | Freq: Two times a day (BID) | ORAL | Status: DC
  Administered 2016-03-23 – 2016-03-24 (×2): 200 mg via ORAL
  Filled 2016-03-23 (×2): qty 1

## 2016-03-23 MED ORDER — GUAIFENESIN ER 600 MG PO TB12
1200.0000 mg | ORAL_TABLET | Freq: Every day | ORAL | Status: DC
  Administered 2016-03-23 – 2016-03-24 (×2): 1200 mg via ORAL
  Filled 2016-03-23 (×2): qty 2

## 2016-03-23 MED ORDER — CELECOXIB 200 MG PO CAPS: 200.0000 mg | ORAL_CAPSULE | Freq: Two times a day (BID) | ORAL | 0 refills | Status: DC

## 2016-03-23 MED ORDER — CEFAZOLIN SODIUM-DEXTROSE 2-4 GM/100ML-% IV SOLN
2.0000 g | Freq: Four times a day (QID) | INTRAVENOUS | Status: AC
  Administered 2016-03-23 (×2): 2 g via INTRAVENOUS
  Filled 2016-03-23 (×2): qty 100

## 2016-03-23 MED ORDER — OXYCODONE HCL 5 MG/5ML PO SOLN: 5.0000 mg | Freq: Once | ORAL | Status: DC | PRN

## 2016-03-23 MED ORDER — ONDANSETRON HCL 4 MG/2ML IJ SOLN
INTRAMUSCULAR | Status: DC | PRN
  Administered 2016-03-23: 4 mg via INTRAVENOUS

## 2016-03-23 MED ORDER — SODIUM CHLORIDE 0.9 % IJ SOLN
INTRAMUSCULAR | Status: DC | PRN
  Administered 2016-03-23: 50 mL

## 2016-03-23 MED ORDER — DEXAMETHASONE SODIUM PHOSPHATE 10 MG/ML IJ SOLN
INTRAMUSCULAR | Status: AC
  Filled 2016-03-23: qty 1

## 2016-03-23 MED ORDER — PROPOFOL 10 MG/ML IV BOLUS
INTRAVENOUS | Status: AC
  Filled 2016-03-23: qty 20

## 2016-03-23 MED ORDER — DEXAMETHASONE SODIUM PHOSPHATE 10 MG/ML IJ SOLN
10.0000 mg | Freq: Once | INTRAMUSCULAR | Status: AC
  Administered 2016-03-24: 10 mg via INTRAVENOUS
  Filled 2016-03-23: qty 1

## 2016-03-23 MED ORDER — SODIUM CHLORIDE 0.9 % IR SOLN
Status: DC | PRN
  Administered 2016-03-23: 1000 mL

## 2016-03-23 MED ORDER — FENTANYL CITRATE (PF) 100 MCG/2ML IJ SOLN: 25.0000 ug | INTRAMUSCULAR | Status: DC | PRN

## 2016-03-23 MED ORDER — FERROUS SULFATE 325 (65 FE) MG PO TABS
325.0000 mg | ORAL_TABLET | Freq: Three times a day (TID) | ORAL | Status: DC
  Administered 2016-03-23 – 2016-03-24 (×2): 325 mg via ORAL
  Filled 2016-03-23 (×2): qty 1

## 2016-03-23 MED ORDER — BUPIVACAINE HCL (PF) 0.25 % IJ SOLN
INTRAMUSCULAR | Status: DC | PRN
  Administered 2016-03-23: 30 mL

## 2016-03-23 MED ORDER — BUPIVACAINE IN DEXTROSE 0.75-8.25 % IT SOLN
INTRATHECAL | Status: DC | PRN
  Administered 2016-03-23: 1.8 mL via INTRATHECAL

## 2016-03-23 MED ORDER — MIDAZOLAM HCL 5 MG/5ML IJ SOLN
INTRAMUSCULAR | Status: DC | PRN
  Administered 2016-03-23: 0.5 mg via INTRAVENOUS
  Administered 2016-03-23: 1.5 mg via INTRAVENOUS

## 2016-03-23 MED ORDER — HYDROCODONE-ACETAMINOPHEN 7.5-325 MG PO TABS: 1.0000 | ORAL_TABLET | ORAL | 0 refills | Status: DC | PRN

## 2016-03-23 MED ORDER — MAGNESIUM CITRATE PO SOLN: 1.0000 | Freq: Once | ORAL | Status: DC | PRN

## 2016-03-23 SURGICAL SUPPLY — 44 items
BAG DECANTER FOR FLEXI CONT (MISCELLANEOUS) IMPLANT
BAG ZIPLOCK 12X15 (MISCELLANEOUS) IMPLANT
BANDAGE ACE 6X5 VEL STRL LF (GAUZE/BANDAGES/DRESSINGS) ×3 IMPLANT
BLADE SAW SGTL 13.0X1.19X90.0M (BLADE) ×3 IMPLANT
BOWL SMART MIX CTS (DISPOSABLE) ×3 IMPLANT
CAPT KNEE TOTAL 3 ATTUNE ×3 IMPLANT
CEMENT HV SMART SET (Cement) ×6 IMPLANT
CLOTH BEACON ORANGE TIMEOUT ST (SAFETY) ×3 IMPLANT
CUFF TOURN SGL QUICK 34 (TOURNIQUET CUFF) ×2
CUFF TRNQT CYL 34X4X40X1 (TOURNIQUET CUFF) ×1 IMPLANT
DECANTER SPIKE VIAL GLASS SM (MISCELLANEOUS) ×3 IMPLANT
DERMABOND ADVANCED (GAUZE/BANDAGES/DRESSINGS) ×2
DERMABOND ADVANCED .7 DNX12 (GAUZE/BANDAGES/DRESSINGS) ×1 IMPLANT
DRAPE U-SHAPE 47X51 STRL (DRAPES) ×3 IMPLANT
DRESSING AQUACEL AG SP 3.5X10 (GAUZE/BANDAGES/DRESSINGS) ×1 IMPLANT
DRSG AQUACEL AG SP 3.5X10 (GAUZE/BANDAGES/DRESSINGS) ×3
DURAPREP 26ML APPLICATOR (WOUND CARE) ×6 IMPLANT
ELECT REM PT RETURN 9FT ADLT (ELECTROSURGICAL) ×3
ELECTRODE REM PT RTRN 9FT ADLT (ELECTROSURGICAL) ×1 IMPLANT
GLOVE BIOGEL M 7.0 STRL (GLOVE) IMPLANT
GLOVE BIOGEL PI IND STRL 7.5 (GLOVE) ×5 IMPLANT
GLOVE BIOGEL PI IND STRL 8.5 (GLOVE) ×1 IMPLANT
GLOVE BIOGEL PI INDICATOR 7.5 (GLOVE) ×10
GLOVE BIOGEL PI INDICATOR 8.5 (GLOVE) ×2
GLOVE ECLIPSE 8.0 STRL XLNG CF (GLOVE) ×6 IMPLANT
GLOVE ORTHO TXT STRL SZ7.5 (GLOVE) ×6 IMPLANT
GOWN STRL REUS W/TWL LRG LVL3 (GOWN DISPOSABLE) ×3 IMPLANT
GOWN STRL REUS W/TWL XL LVL3 (GOWN DISPOSABLE) ×3 IMPLANT
HANDPIECE INTERPULSE COAX TIP (DISPOSABLE) ×2
MANIFOLD NEPTUNE II (INSTRUMENTS) ×3 IMPLANT
PACK TOTAL KNEE CUSTOM (KITS) ×3 IMPLANT
POSITIONER SURGICAL ARM (MISCELLANEOUS) ×3 IMPLANT
SET HNDPC FAN SPRY TIP SCT (DISPOSABLE) ×1 IMPLANT
SET PAD KNEE POSITIONER (MISCELLANEOUS) ×3 IMPLANT
SUT MNCRL AB 4-0 PS2 18 (SUTURE) ×3 IMPLANT
SUT VIC AB 1 CT1 36 (SUTURE) ×3 IMPLANT
SUT VIC AB 2-0 CT1 27 (SUTURE) ×6
SUT VIC AB 2-0 CT1 TAPERPNT 27 (SUTURE) ×3 IMPLANT
SUT VLOC 180 0 24IN GS25 (SUTURE) ×3 IMPLANT
SYR 50ML LL SCALE MARK (SYRINGE) IMPLANT
TRAY FOLEY CATH 14FR (SET/KITS/TRAYS/PACK) ×3 IMPLANT
WATER STERILE IRR 1500ML POUR (IV SOLUTION) ×6 IMPLANT
WRAP KNEE MAXI GEL POST OP (GAUZE/BANDAGES/DRESSINGS) ×3 IMPLANT
YANKAUER SUCT BULB TIP 10FT TU (MISCELLANEOUS) ×3 IMPLANT

## 2016-03-23 NOTE — Evaluation (Signed)
Physical Therapy Evaluation Patient Details Name: Miranda Thompson MRN: [id number] DOB: Aug 13, 1933 Today's Date: 03/23/2016   History of Present Illness  L TKA, h/o R TKA 2016, R reverse TSA 2017  Clinical Impression  Pt is s/p TKA resulting in the deficits listed below (see PT Problem List). Pt ambulated 92' with RW, good progress expected.  Pt will benefit from skilled PT to increase their independence and safety with mobility to allow discharge to the venue listed below.      Follow Up Recommendations Home health PT    Equipment Recommendations  None recommended by PT    Recommendations for Other Services       Precautions / Restrictions Precautions Precautions: Knee Precaution Comments: reviewed no pillow under knee Restrictions Weight Bearing Restrictions: No      Mobility  Bed Mobility Overal bed mobility: Modified Independent             General bed mobility comments: self assisted LLE with RLE  Transfers Overall transfer level: Needs assistance Equipment used: Rolling walker (2 wheeled) Transfers: Sit to/from Stand Sit to Stand: Min assist         General transfer comment: VCs hand placement, min A to rise  Ambulation/Gait Ambulation/Gait assistance: Min guard Ambulation Distance (Feet): 45 Feet Assistive device: Rolling walker (2 wheeled) Gait Pattern/deviations: Step-through pattern   Gait velocity interpretation: Below normal speed for age/gender General Gait Details: VCs sequencing, no LOB  Stairs            Wheelchair Mobility    Modified Rankin (Stroke Patients Only)       Balance Overall balance assessment: Modified Independent                                           Pertinent Vitals/Pain Pain Assessment: 0-10 Pain Score: 3  Pain Location: L knee with activity Pain Descriptors / Indicators: Sore Pain Intervention(s): Limited activity within patient's tolerance;Monitored during session;Premedicated  before session;Ice applied    Home Living Family/patient expects to be discharged to:: Private residence Living Arrangements: Spouse/significant other Available Help at Discharge: Family;Available 24 hours/day Type of Home: House Home Access: Stairs to enter Entrance Stairs-Rails: Psychiatric nurse of Steps: 6 Home Layout: Multi-level;Able to live on main level with bedroom/bathroom Home Equipment: Toilet riser;Tub bench;Walker - 2 wheels;Bedside commode;Cane - single point      Prior Function Level of Independence: Independent with assistive device(s)         Comments: used SPC prn     Hand Dominance   Dominant Hand: Right    Extremity/Trunk Assessment   Upper Extremity Assessment Upper Extremity Assessment: Defer to OT evaluation    Lower Extremity Assessment Lower Extremity Assessment: LLE deficits/detail LLE Deficits / Details: 3/5 SLR, knee AAROM 0-75* AAROM     Cervical / Trunk Assessment Cervical / Trunk Assessment: Normal  Communication   Communication: No difficulties  Cognition Arousal/Alertness: Awake/alert Behavior During Therapy: WFL for tasks assessed/performed Overall Cognitive Status: Within Functional Limits for tasks assessed                      General Comments      Exercises Total Joint Exercises Ankle Circles/Pumps: AROM;Both;10 reps Quad Sets: AROM;Both;10 reps Heel Slides: AAROM;Left;10 reps;Supine Long Arc Quad: AROM;Left;5 reps;Seated   Assessment/Plan    PT Assessment Patient needs continued PT services  PT Problem  List Decreased strength;Decreased range of motion;Decreased activity tolerance;Pain;Decreased mobility          PT Treatment Interventions Gait training;Stair training;DME instruction;Therapeutic activities;Therapeutic exercise;Functional mobility training;Patient/family education    PT Goals (Current goals can be found in the Care Plan section)  Acute Rehab PT Goals Patient Stated Goal:  to walk PT Goal Formulation: With patient/family Time For Goal Achievement: 03/30/16 Potential to Achieve Goals: Good    Frequency 7X/week   Barriers to discharge        Co-evaluation               End of Session Equipment Utilized During Treatment: Gait belt Activity Tolerance: Patient tolerated treatment well Patient left: in chair;with call bell/phone within reach;with family/visitor present;with chair alarm set Nurse Communication: Mobility status         Time: [id number] PT Time Calculation (min) (ACUTE ONLY): 16 min   Charges:   PT Evaluation $PT Eval Low Complexity: 1 Procedure     PT G CodesPhilomena Doheny 03/23/2016, 4:51 PM [phone]

## 2016-03-23 NOTE — Anesthesia Procedure Notes (Signed)
Spinal  Patient location during procedure: OR Start time: 03/23/2016 7:24 AM End time: 03/23/2016 7:27 AM Staffing Anesthesiologist: Marcie Bal, ADAM Resident/CRNA: Chrystine Oiler G Performed: resident/CRNA  Preanesthetic Checklist Completed: patient identified, site marked, surgical consent, pre-op evaluation, timeout performed, IV checked, risks and benefits discussed and monitors and equipment checked Spinal Block Patient position: sitting Prep: DuraPrep Patient monitoring: heart rate, continuous pulse ox and blood pressure Approach: midline Location: L2-3 Injection technique: single-shot Needle Needle type: Pencan  Needle gauge: 24 G Needle length: 10 cm Needle insertion depth: 5 cm Assessment Sensory level: T6 Additional Notes Kit expiration date checked, TO done, sitting position, sterile prep and drape, local 1%, 24g pencan x 1 stick, -paraesthesia, -heme, +CSF pre and post injection, patient tolerated well.  Sensory level T6.

## 2016-03-23 NOTE — Discharge Instructions (Signed)

## 2016-03-23 NOTE — Transfer of Care (Signed)
Immediate Anesthesia Transfer of Care Note  Patient: Miranda Thompson  Procedure(s) Performed: Procedure(s): LEFT TOTAL KNEE ARTHROPLASTY (Left)  Patient Location: PACU  Anesthesia Type:MAC and Spinal  Level of Consciousness: awake, alert  and oriented  Airway & Oxygen Therapy: Patient Spontanous Breathing and Patient connected to face mask oxygen  Post-op Assessment: Report given to RN and Post -op Vital signs reviewed and stable  Post vital signs: Reviewed and stable  Last Vitals:  Vitals:   03/23/16 0533  BP: 133/65  Pulse: 88  Resp: 16  Temp: 36.6 C    Last Pain:  Vitals:   03/23/16 0533  TempSrc: Oral         Complications: No apparent anesthesia complications

## 2016-03-23 NOTE — Anesthesia Postprocedure Evaluation (Signed)
Anesthesia Post Note  Patient: Miranda Thompson  Procedure(s) Performed: Procedure(s) (LRB): LEFT TOTAL KNEE ARTHROPLASTY (Left)  Patient location during evaluation: PACU Anesthesia Type: Spinal Level of consciousness: oriented and awake and alert Pain management: pain level controlled Vital Signs Assessment: post-procedure vital signs reviewed and stable Respiratory status: spontaneous breathing, respiratory function stable and patient connected to nasal cannula oxygen Cardiovascular status: blood pressure returned to baseline and stable Postop Assessment: no headache and no backache Anesthetic complications: no       Last Vitals:  Vitals:   03/23/16 1000 03/23/16 1009  BP: 110/72 122/77  Pulse: 72 74  Resp: 19 16  Temp:  36.3 C    Last Pain:  Vitals:   03/23/16 0533  TempSrc: Oral                 Barney Gertsch S

## 2016-03-23 NOTE — Progress Notes (Signed)
Neo gtt turned off upon arrival to pacu 

## 2016-03-23 NOTE — Anesthesia Procedure Notes (Signed)
Anesthesia Regional Block:  Adductor canal block  Pre-Anesthetic Checklist: ,, timeout performed, Correct Patient, Correct Site, Correct Laterality, Correct Procedure, Correct Position, site marked, Risks and benefits discussed,  Surgical consent,  Pre-op evaluation,  At surgeon's request and post-op pain management  Laterality: Left  Prep: chloraprep       Needles:  Injection technique: Single-shot  Needle Type: Echogenic Needle     Needle Length: 9cm 9 cm Needle Gauge: 21 and 21 G    Additional Needles:  Procedures: ultrasound guided (picture in chart) Adductor canal block Narrative:  Start time: 03/23/2016 7:01 AM End time: 03/23/2016 7:10 AM Injection made incrementally with aspirations every 5 mL.  Performed by: Personally  Anesthesiologist: Maitland Lesiak  Additional Notes: Pt tolerated the procedure well.

## 2016-03-23 NOTE — Interval H&P Note (Signed)
History and Physical Interval Note:  03/23/2016 7:06 AM  Miranda Thompson  has presented today for surgery, with the diagnosis of left knee osteoarthritis  The various methods of treatment have been discussed with the patient and family. After consideration of risks, benefits and other options for treatment, the patient has consented to  Procedure(s): LEFT TOTAL KNEE ARTHROPLASTY (Left) as a surgical intervention .  The patient's history has been reviewed, patient examined, no change in status, stable for surgery.  I have reviewed the patient's chart and labs.  Questions were answered to the patient's satisfaction.     Mauri Pole

## 2016-03-23 NOTE — Anesthesia Preprocedure Evaluation (Addendum)
Anesthesia Evaluation  Patient identified by MRN, date of birth, ID band Patient awake    Reviewed: Allergy & Precautions, H&P , NPO status , Patient's Chart, lab work & pertinent test results  Airway Mallampati: II   Neck ROM: full    Dental   Pulmonary COPD, former smoker,    breath sounds clear to auscultation       Cardiovascular hypertension, + dysrhythmias Atrial Fibrillation  Rhythm:regular Rate:Normal     Neuro/Psych    GI/Hepatic   Endo/Other    Renal/GU      Musculoskeletal  (+) Arthritis , Fibromyalgia -  Abdominal   Peds  Hematology  (+) anemia ,   Anesthesia Other Findings   Reproductive/Obstetrics                             Anesthesia Physical Anesthesia Plan  ASA: III  Anesthesia Plan: Spinal   Post-op Pain Management:  Regional for Post-op pain   Induction: Intravenous  Airway Management Planned: Simple Face Mask  Additional Equipment:   Intra-op Plan:   Post-operative Plan:   Informed Consent: I have reviewed the patients History and Physical, chart, labs and discussed the procedure including the risks, benefits and alternatives for the proposed anesthesia with the patient or authorized representative who has indicated his/her understanding and acceptance.     Plan Discussed with: CRNA, Anesthesiologist and Surgeon  Anesthesia Plan Comments:         Anesthesia Quick Evaluation

## 2016-03-23 NOTE — Op Note (Signed)
NAME:  Miranda Thompson                      MEDICAL RECORD NO.:  [id number]                             FACILITY:  [facility]      PHYSICIAN:  Pietro Cassis. Alvan Dame, M.D.  DATE OF BIRTH:  12/29/1933      DATE OF PROCEDURE:  03/23/2016                                     OPERATIVE REPORT         PREOPERATIVE DIAGNOSIS:  Left knee osteoarthritis.      POSTOPERATIVE DIAGNOSIS:  Left knee osteoarthritis.      FINDINGS:  The patient was noted to have complete loss of cartilage and   bone-on-bone arthritis with associated osteophytes in the medial and patellofemoral compartments of   the knee with a significant synovitis and associated effusion.      PROCEDURE:  Left total knee replacement.      COMPONENTS USED:  DePuy Attune rotating platform posterior stabilized knee   system, a size 5N femur, 4 tibia, size 7 PS AOX insert, and 32 anatomic patellar   button.      SURGEON:  Pietro Cassis. Alvan Dame, M.D.      ASSISTANT:  Danae Orleans, PA-C.      ANESTHESIA:  Regional and Spinal.      SPECIMENS:  None.      COMPLICATION:  None.      DRAINS:  None.  EBL: <100cc      TOURNIQUET TIME:   Total Tourniquet Time Documented: Thigh (Left) - 24 minutes Total: Thigh (Left) - 24 minutes  .      The patient was stable to the recovery room.      INDICATION FOR PROCEDURE:  [name] is a 83 y.o. female patient of   mine.  The patient had been seen, evaluated, and treated conservatively in the   office with medication, activity modification, and injections.  The patient had   radiographic changes of bone-on-bone arthritis with endplate sclerosis and osteophytes noted.      The patient failed conservative measures including medication, injections, and activity modification, and at this point was ready for more definitive measures.   Based on the radiographic changes and failed conservative measures, the patient   decided to proceed with total knee replacement.  Risks of infection,   DVT,  component failure, need for revision surgery, postop course, and   expectations were all   discussed and reviewed.  Consent was obtained for benefit of pain   relief.      PROCEDURE IN DETAIL:  The patient was brought to the operative theater.   Once adequate anesthesia, preoperative antibiotics, 2 gm of Ancef, 1 gm of Tranexamic Acid, and 10 mg of Decadron administered, the patient was positioned supine with the left thigh tourniquet placed.  The  left lower extremity was prepped and draped in sterile fashion.  A time-   out was performed identifying the patient, planned procedure, and   extremity.      The left lower extremity was placed in the [facility] leg holder.  The leg was   exsanguinated, tourniquet elevated to 250 mmHg.  A midline incision was  made followed by median parapatellar arthrotomy.  Following initial   exposure, attention was first directed to the patella.  Precut   measurement was noted to be 21 mm.  I resected down to 13 mm and used a   32 anatomic patellar button to restore patellar height as well as cover the cut   surface.      The lug holes were drilled and a metal shim was placed to protect the   patella from retractors and saw blades.      At this point, attention was now directed to the femur.  The femoral   canal was opened with a drill, irrigated to try to prevent fat emboli.  An   intramedullary rod was passed at 3 degrees valgus, 9 mm of bone was   resected off the distal femur.  Following this resection, the tibia was   subluxated anteriorly.  Using the extramedullary guide, 2 mm of bone was resected off   the proximal medial tibia.  We confirmed the gap would be   stable medially and laterally with a size 5 spacer block as well as confirmed   the cut was perpendicular in the coronal plane, checking with an alignment rod.      Once this was done, I sized the femur to be a size 5 in the anterior-   posterior dimension, chose a narrow component based on  medial and   lateral dimension.  The size 5 rotation block was then pinned in   position anterior referenced using the C-clamp to set rotation.  The   anterior, posterior, and  chamfer cuts were made without difficulty nor   notching making certain that I was along the anterior cortex to help   with flexion gap stability.      The final box cut was made off the lateral aspect of distal femur.      At this point, the tibia was sized to be a size 4, the size 4 tray was   then pinned in position through the medial third of the tubercle,   drilled, and keel punched.  Trial reduction was now carried with a 5 femur,  4 tibia, a size 6 then 7 PS insert, and the 32 anatomic patella botton.  The knee was brought to   extension, full extension with good flexion stability with the patella   tracking through the trochlea without application of pressure.  Given   all these findings the femoral lug holes were drilled and then the trial components removed.  Final components were   opened and cement was mixed.  The knee was irrigated with normal saline   solution and pulse lavage.  The synovial lining was   then injected with 30 cc of 0.25% Marcaine without epinephrine and 1 cc of Toradol plus 30 cc of NS for a total of 61 cc.      The knee was irrigated.  Final implants were then cemented onto clean and   dried cut surfaces of bone with the knee brought to extension with a size 7 PS trial insert.      Once the cement had fully cured, the excess cement was removed   throughout the knee.  I confirmed I was satisfied with the range of   motion and stability, and the final size PS AOX insert was chosen.  It was   placed into the knee.      The tourniquet had been let down at 24 minutes.  No significant   hemostasis required.  The   extensor mechanism was then reapproximated using #1 Vicryl and #0 V-lock sutures with the knee   in flexion.  The   remaining wound was closed with 2-0 Vicryl and running 4-0  Monocryl.   The knee was cleaned, dried, dressed sterilely using Dermabond and   Aquacel dressing.  The patient was then   brought to recovery room in stable condition, tolerating the procedure   well.   Please note that Physician Assistant, Danae Orleans, PA-C, was present for the entirety of the case, and was utilized for pre-operative positioning, peri-operative retractor management, general facilitation of the procedure.  He was also utilized for primary wound closure at the end of the case.              Pietro Cassis Alvan Dame, M.D.    03/23/2016 8:39 AM

## 2016-03-24 LAB — BASIC METABOLIC PANEL
Anion gap: 4 — ABNORMAL LOW (ref 5–15)
BUN: 15 mg/dL (ref 6–20)
CO2: 25 mmol/L (ref 22–32)
Calcium: 8.3 mg/dL — ABNORMAL LOW (ref 8.9–10.3)
Chloride: 103 mmol/L (ref 101–111)
Creatinine, Ser: 0.52 mg/dL (ref 0.44–1.00)
GFR calc Af Amer: >60 mL/min (ref >60)
GFR calc non Af Amer: >60 mL/min (ref >60)
Glucose, Bld: 150 mg/dL — ABNORMAL HIGH (ref 65–99)
Potassium: 4.4 mmol/L (ref 3.5–5.1)
Sodium: 132 mmol/L — ABNORMAL LOW (ref 135–145)

## 2016-03-24 LAB — CBC
HCT: 29.7 % — ABNORMAL LOW (ref 36.0–46.0)
Hemoglobin: 10.4 g/dL — ABNORMAL LOW (ref 12.0–15.0)
MCH: 33.1 pg (ref 26.0–34.0)
MCHC: 35 g/dL (ref 30.0–36.0)
MCV: 94.6 fL (ref 78.0–100.0)
Platelets: 201 10*3/uL (ref 150–400)
RBC: 3.14 MIL/uL — ABNORMAL LOW (ref 3.87–5.11)
RDW: 12.3 % (ref 11.5–15.5)
WBC: 12.8 10*3/uL — ABNORMAL HIGH (ref 4.0–10.5)

## 2016-03-24 NOTE — Progress Notes (Signed)
Patient ID: Miranda Thompson, female   DOB: December 16, 1933, 83 y.o.   MRN: [id number] Subjective: 1 Day Post-Op Procedure(s) (LRB): LEFT TOTAL KNEE ARTHROPLASTY (Left)    Patient reports pain as mild to moderate.  No events.  I had a chance to review her questions with her this am.  Objective:   VITALS:   Vitals:   03/24/16 0133 03/24/16 0600  BP: 126/78 (!) 111/59  Pulse: 81 84  Resp: 16 15  Temp: 97.4 F (36.3 C) 97.9 F (36.6 C)    Neurovascular intact Incision: dressing C/D/I  LABS  Recent Labs  03/24/16 0428  HGB 10.4*  HCT 29.7*  WBC 12.8*  PLT 201     Recent Labs  03/24/16 0428  NA 132*  K 4.4  BUN 15  CREATININE 0.52  GLUCOSE 150*    No results for input(s): LABPT, INR in the last 72 hours.   Assessment/Plan: 1 Day Post-Op Procedure(s) (LRB): LEFT TOTAL KNEE ARTHROPLASTY (Left)   Advance diet Up with therapy Discharge home with home health per patient request based on personal experience  RTC in 2 weeks Rx at discharge

## 2016-03-24 NOTE — Care Management Note (Addendum)
Case Management Note  Patient Details  Name: Miranda Thompson MRN: [id number] Date of Birth: 04/06/1933  Subjective/Objective:                  LEFT TOTAL KNEE ARTHROPLASTY (Left) Action/Plan: Discharge planning Expected Discharge Date:  03/24/16               Expected Discharge Plan:  [place]  In-House Referral:     Discharge planning Services  CM Consult  Post Acute Care Choice:  Home Health Choice offered to:  Patient  DME Arranged:  N/A DME Agency:  NA  HH Arranged:  PT [place] Agency:  [facility] (formerly [facility])  Status of Service:  Completed, signed off  If discussed at [employer], dates discussed:    Additional Comments: CM met with pt to offer choice of hme health agency. Pt chooses [facility] to render HHPT. Referral given to [facility] rep, Tim. Pt states she has all DMe needed at home. No other Cm needs were communicated. Dellie Catholic, RN 03/24/2016, 12:21 PM

## 2016-03-24 NOTE — Evaluation (Signed)
Occupational Therapy Evaluation Patient Details Name: Miranda Thompson MRN: [id number] DOB: [birth year] Today's Date: 03/24/2016    History of Present Illness L TKA, h/o R TKA 2016, R reverse TSA 2017   Clinical Impression   Pt admitted as above. Overall supervision-Min A for functional mobility and transfers. Able to perform LB dressing while sitting w/o a/e. Reviewed tub transfers. Has DME and will also have PRN family assist when d/c home. Denies further acute OT needs at this time. Will sign off.    Follow Up Recommendations  No OT follow up    Equipment Recommendations  None recommended by OT (Pt has DME and family assist PRN)    Recommendations for Other Services       Precautions / Restrictions Precautions Precautions: Knee Precaution Comments: reviewed no pillow under knee Restrictions Weight Bearing Restrictions: No      Mobility Bed Mobility Overal bed mobility: Modified Independent                Transfers Overall transfer level: Needs assistance Equipment used: Rolling walker (2 wheeled) Transfers: Sit to/from [employer] Transfers Sit to Stand: Supervision Stand pivot transfers: Min guard            Balance Overall balance assessment: Modified Independent;No apparent balance deficits (not formally assessed)                                          ADL Overall ADL's : Needs assistance/impaired Eating/Feeding: Independent;Sitting   Grooming: Wash/dry hands;Wash/dry face;Standing;Supervision/safety   Upper Body Bathing: Set up;Sitting   Lower Body Bathing: Set up;Supervison/ safety;Sit to/from stand   Upper Body Dressing : Set up;Sitting   Lower Body Dressing: Set up;Supervision/safety;Sit to/from stand Lower Body Dressing Details (indicate cue type and reason): Pt able to don/doff socks sitting in chair and crossing one leg over the other to reach her feet Toilet Transfer:  Supervision/safety;Ambulation;RW;Comfort height toilet;Grab bars Toilet Transfer Details (indicate cue type and reason): Pt has toilet riser at home nad denies any other needs "I've done this before" Toileting- Clothing Manipulation and Hygiene: Supervision/safety;Sitting/lateral lean;Sit to/from stand   Tub/ Shower Transfer: Minimal assistance;Rolling walker;Ambulation;Tub bench (Reviewed tub transfer - pt has tub bench at home, will have family assist when at home.) Tub/Shower Transfer Details (indicate cue type and reason): Verbal review and simulated demonstration of tub bench use. Pt uses at home and plans to have family assist PRN  Functional mobility during ADLs: Supervision/safety General ADL Comments: Pt was educated in role of OT and participated in ADL retraining session, after evaluation today, with focus on toileting, grooming standing at sink, LD ADL's and tub transfers. She has all DME and plans to d/c home with PRN from family.     Vision  Glasses for reading. Scratched cornea "About 2 weeks ago, it's all better now" per pt report. No change from baseline.   Perception     Praxis      Pertinent Vitals/Pain Pain Assessment: No/denies pain Pain Score: 0-No pain     Hand Dominance Right   Extremity/Trunk Assessment Upper Extremity Assessment Upper Extremity Assessment: RUE deficits/detail;Overall [facility] for tasks assessed RUE Deficits / Details: Complete reverse shoulder replacement in June 2017. AROM WFL's for ADL's and home making - can wash hair, reach into cabinets etc.   Lower Extremity Assessment Lower Extremity Assessment: Defer to PT evaluation   Cervical / Trunk  Assessment Cervical / Trunk Assessment: Normal   Communication Communication Communication: No difficulties   Cognition Arousal/Alertness: Awake/alert Behavior During Therapy: WFL for tasks assessed/performed Overall Cognitive Status: Within Functional Limits for tasks assessed                      General Comments       Exercises       Shoulder Instructions      Home Living Family/patient expects to be discharged to:: Private residence Living Arrangements: Spouse/significant other Available Help at Discharge: Family;Available 24 hours/day Type of Home: House Home Access: Stairs to enter [employer] of Steps: 6 Entrance Stairs-Rails: Right;Left Home Layout: Multi-level;Able to live on main level with bedroom/bathroom Alternate Level Stairs-Number of Steps: 13 Alternate Level Stairs-Rails: Right;Left Bathroom Shower/Tub: Tub/shower unit Shower/tub characteristics: Architectural technologist: Standard     Home Equipment: Toilet riser;Tub bench;Walker - 2 wheels;Bedside commode;Cane - quad;Hand held shower head;Adaptive equipment Adaptive Equipment: Long-handled sponge        Prior Functioning/Environment Level of Independence: Independent with assistive device(s)        Comments: Pt reports that she was encouraged to use quad cane by daughter and husband prior to thios surgery but "I usually forgot it"        OT Problem List:     OT Treatment/Interventions:      OT Goals(Current goals can be found in the care plan section) Acute Rehab OT Goals Patient Stated Goal: Go home later today OT Goal Formulation: All assessment and education complete, DC therapy  OT Frequency:     Barriers to D/C:            Co-evaluation              End of Session Equipment Utilized During Treatment: Rolling walker Nurse Communication: Mobility status;Other (comment) (Pt has DME, Pulse Ox not turning back on. Urinated 150 ml)  Activity Tolerance: Patient tolerated treatment well;No increased pain Patient left: in chair;with call bell/phone within reach;with chair alarm set   Time: [phone] OT Time Calculation (min): 38 min Charges:  OT General Charges $OT Visit: 1 Procedure OT Evaluation $OT Eval Low Complexity: 1 Procedure OT Treatments $Self  Care/Home Management : 23-37 mins G-Codes:    Almyra Deforest, OTR/L 03/24/2016, 8:45 AM

## 2016-03-24 NOTE — Progress Notes (Signed)
Physical Therapy Treatment Patient Details Name: Miranda Thompson MRN: [id number] DOB: 12/31/1933 Today's Date: 03/24/2016    History of Present Illness L TKA, h/o R TKA 2016, R reverse TSA 2017    PT Comments    Pt is progressing well with mobility, she has met PT goals and is ready to DC home. Stair training completed, pt demonstrates understanding of HEP. DC PT.   Follow Up Recommendations  Home health PT     Equipment Recommendations  None recommended by PT    Recommendations for Other Services       Precautions / Restrictions Precautions Precautions: Knee Precaution Comments: reviewed no pillow under knee Restrictions Weight Bearing Restrictions: No    Mobility  Bed Mobility Overal bed mobility: Modified Independent                Transfers Overall transfer level: Needs assistance Equipment used: Rolling walker (2 wheeled) Transfers: Sit to/from Stand;Stand Pivot Transfers Sit to Stand: Supervision Stand pivot transfers: Min guard          Ambulation/Gait Ambulation/Gait assistance: Modified independent (Device/Increase time) Ambulation Distance (Feet): 300 Feet (300 + 450) Assistive device: Rolling walker (2 wheeled) Gait Pattern/deviations: Step-through pattern   Gait velocity interpretation: at or above normal speed for age/gender General Gait Details: no LOB   Stairs Stairs: Yes   Stair Management: One rail Right;Forwards;Step to pattern;With cane Number of Stairs: 4 General stair comments: VCs sequencing  Wheelchair Mobility    Modified Rankin (Stroke Patients Only)       Balance Overall balance assessment: Modified Independent                                  Cognition Arousal/Alertness: Awake/alert Behavior During Therapy: WFL for tasks assessed/performed Overall Cognitive Status: Within Functional Limits for tasks assessed                      Exercises Total Joint Exercises Ankle Circles/Pumps:  AROM;Both;10 reps Quad Sets: AROM;Both;5 reps;Supine Towel Squeeze: AROM;Both;5 reps;Supine Short Arc Quad: AROM;Left;10 reps;Supine Heel Slides: AAROM;Left;10 reps;Supine Straight Leg Raises: AROM;Left;10 reps;Supine Goniometric ROM: 0-65* AAROM L knee    General Comments        Pertinent Vitals/Pain Pain Assessment: No/denies pain Pain Score: 3  Pain Location: L knee with activity Pain Descriptors / Indicators: Sore Pain Intervention(s): Limited activity within patient's tolerance;Monitored during session;Premedicated before session;Ice applied    Home Living Family/patient expects to be discharged to:: Private residence Living Arrangements: Spouse/significant other Available Help at Discharge: Family;Available 24 hours/day Type of Home: House Home Access: Stairs to enter Entrance Stairs-Rails: Right;Left Home Layout: Multi-level;Able to live on main level with bedroom/bathroom Home Equipment: Toilet riser;Tub bench;Walker - 2 wheels;Bedside commode;Cane - quad;Hand held shower head;Adaptive equipment      Prior Function Level of Independence: Independent with assistive device(s)      Comments: Pt reports that she was encouraged to use quad cane by daughter and husband prior to thios surgery but "I usually forgot it"   PT Goals (current goals can now be found in the care plan section) Acute Rehab PT Goals Patient Stated Goal: walk for exercise, ride her stationary bike PT Goal Formulation: With patient Time For Goal Achievement: 03/30/16 Potential to Achieve Goals: Good Progress towards PT goals: Goals met/education completed, patient discharged from PT    Frequency    7X/week      PT Plan  Current plan remains appropriate    Co-evaluation             End of Session Equipment Utilized During Treatment: Gait belt Activity Tolerance: Patient tolerated treatment well Patient left: in chair;with call bell/phone within reach;with family/visitor present;with  chair alarm set     Time: 0920-1008 PT Time Calculation (min) (ACUTE ONLY): 48 min  Charges:  $Gait Training: 23-37 mins $Therapeutic Exercise: 8-22 mins                    G Codes:      Philomena Doheny 03/24/2016, 10:19 AM [phone]

## 2016-04-02 NOTE — Discharge Summary (Signed)
Physician Discharge Summary  Patient ID: Miranda Thompson MRN: [id number] DOB/AGE: May 04, 1933 83 y.o.  Admit date: 03/23/2016 Discharge date: 03/24/2016   Procedures:  Procedure(s) (LRB): LEFT TOTAL KNEE ARTHROPLASTY (Left)  Attending Physician:  Dr. [name]   Admission Diagnoses:   Left knee primary OA / pain  Discharge Diagnoses:  Principal Problem:   S/P left TKA Active Problems:   Status post left knee replacement   S/P knee replacement  Past Medical History:  Diagnosis Date  . Allergic rhinitis   . Anemia   . Atrial fibrillation ([place])    goes in and out of this rhythm- takes Tenormin  . Breast cancer, left breast ([place]) 1994 with lumpectomy   chemo,tamox  . Bronchiectasis with acute exacerbation ([place])   . COPD (chronic obstructive pulmonary disease) ([place])   . Diverticulosis of colon   . DJD (degenerative joint disease)   . Dysrhythmia    palpitations  . Family history of malignant neoplasm of gastrointestinal tract   . Fibromyalgia   . Hemoptysis   . History of pneumonia   . Hypercholesteremia   . Hypertension   . Low back pain syndrome   . Osteopenia   . Pneumonia    hx  . Pseudomonas infection   . Rotator cuff syndrome of right shoulder   . Spinal stenosis     HPI:    Miranda Thompson, 83 y.o. Thompson, has a history of pain and functional disability in the left knee due to arthritis and has failed non-surgical conservative treatments for greater than 12 weeks to include NSAID's and/or analgesics, corticosteriod injections, use of assistive devices and activity modification.  Onset of symptoms was gradual, starting 40+ years ago with gradually worsening course since that time. The patient noted prior procedures on the knee to include  arthroplasty on the right knee(s).  Patient currently rates pain in the left knee(s) at 7 out of 10 with activity. Patient has worsening of pain with activity and weight bearing, pain that interferes with activities of daily  living, pain with passive range of motion, crepitus and joint swelling.  Patient has evidence of periarticular osteophytes and joint space narrowing by imaging studies.  There is no active infection.   Risks, benefits and expectations were discussed with the patient.  Risks including but not limited to the risk of anesthesia, blood clots, nerve damage, blood vessel damage, failure of the prosthesis, infection and up to and including death.  Patient understand the risks, benefits and expectations and wishes to proceed with surgery.   PCP: Noralee Space, MD   Discharged Condition: good  Hospital Course:  Patient underwent the above stated procedure on 03/23/2016. Patient tolerated the procedure well and brought to the recovery room in good condition and subsequently to the floor.  POD #1 BP: 111/59 ; Pulse: 84 ; Temp: 97.9 F (36.6 C) ; Resp: 15 Patient reports pain as mild to moderate.  No events.  I had a chance to review her questions with her this am. Dorsiflexion/plantar flexion intact, incision: dressing C/D/I, no cellulitis present and compartment soft.   LABS  Basename    HGB     10.4  HCT     29.7    Discharge Exam: General appearance: alert, cooperative and no distress Extremities: Homans sign is negative, no sign of DVT, no edema, redness or tenderness in the calves or thighs and no ulcers, gangrene or trophic changes  Disposition: Home with follow up in 2 weeks  Follow-up Information    Mauri Pole, MD. Schedule an appointment as soon as possible for a visit in 2 week(s).   Specialty:  Orthopedic Surgery Contact information: [address] [place] [address] [phone]        [facility] Follow up.   Specialty:  Home Health Services Why:  home health physical therapy Contact information: [place] [address] [phone]           Discharge Instructions    Call MD / Call 911    Complete by:  As  directed    If you experience chest pain or shortness of breath, CALL 911 and be transported to the hospital emergency room.  If you develope a fever above 101 F, pus (white drainage) or increased drainage or redness at the wound, or calf pain, call your surgeon's office.   Change dressing    Complete by:  As directed    Maintain surgical dressing until follow up in the clinic. If the edges start to pull up, may reinforce with tape. If the dressing is no longer working, may remove and cover with gauze and tape, but must keep the area dry and clean.  Call with any questions or concerns.   Constipation Prevention    Complete by:  As directed    Drink plenty of fluids.  Prune juice may be helpful.  You may use a stool softener, such as Colace (over the counter) 100 mg twice a day.  Use MiraLax (over the counter) for constipation as needed.   Diet - low sodium heart healthy    Complete by:  As directed    Discharge instructions    Complete by:  As directed    Maintain surgical dressing until follow up in the clinic. If the edges start to pull up, may reinforce with tape. If the dressing is no longer working, may remove and cover with gauze and tape, but must keep the area dry and clean.  Follow up in 2 weeks at [facility]. Call with any questions or concerns.   Increase activity slowly as tolerated    Complete by:  As directed    Weight bearing as tolerated with assist device (walker, cane, etc) as directed, use it as long as suggested by your surgeon or therapist, typically at least 4-6 weeks.   TED hose    Complete by:  As directed    Use stockings (TED hose) for 2 weeks on both leg(s).  You may remove them at night for sleeping.      Allergies as of 03/24/2016   No Known Allergies     Medication List    STOP taking these medications   acetaminophen 500 MG tablet Commonly known as:  TYLENOL   YUVAFEM 10 MCG Tabs vaginal tablet Generic drug:  Estradiol     TAKE these  medications   ALIGN PO Take 1 tablet by mouth daily.   aspirin 81 MG chewable tablet Chew 1 tablet (81 mg total) by mouth 2 (two) times daily. Take for 4 weeks, then resume regular dose. What changed:  when to take this  additional instructions   Calcium Carb-Cholecalciferol 600-800 MG-UNIT Tabs Take 1 tablet by mouth daily.   celecoxib 200 MG capsule Commonly known as:  CELEBREX Take 1 capsule (200 mg total) by mouth every 12 (twelve) hours.   cholecalciferol 1000 units tablet Commonly known as:  VITAMIN D Take 1,000 Units by mouth daily.  diltiazem 120 MG 24 hr capsule Commonly known as:  CARDIZEM CD TAKE ONE CAPSULE BY MOUTH DAILY   docusate sodium 100 MG capsule Commonly known as:  COLACE Take 1 capsule (100 mg total) by mouth 2 (two) times daily.   ferrous sulfate 325 (65 FE) MG tablet Take 1 tablet (325 mg total) by mouth 3 (three) times daily after meals. What changed:  when to take this   Fluticasone-Salmeterol 250-50 MCG/DOSE Aepb Commonly known as:  ADVAIR Inhale 1 puff into the lungs daily.   guaiFENesin 600 MG 12 hr tablet Commonly known as:  MUCINEX Take 1,200 mg by mouth 2 (two) times daily.   MUCINEX MAXIMUM STRENGTH 1200 MG Tb12 Generic drug:  Guaifenesin Take 1,200 mg by mouth daily.   HYDROcodone-acetaminophen 7.5-325 MG tablet Commonly known as:  NORCO Take 1-2 tablets by mouth every 4 (four) hours as needed for moderate pain.   losartan 50 MG tablet Commonly known as:  COZAAR TAKE 1 TABLET EVERY DAY   Magnesium 500 MG Caps Take 1,000 mg by mouth at bedtime.   methocarbamol 500 MG tablet Commonly known as:  ROBAXIN Take 1 tablet (500 mg total) by mouth every 6 (six) hours as needed for muscle spasms.   MULTIVITAMIN & MINERAL PO Take 1 tablet by mouth daily.   polyethylene glycol packet Commonly known as:  MIRALAX / GLYCOLAX Take 17 g by mouth 2 (two) times daily.        Signed: West Pugh. Alexandro Line   PA-C  04/02/2016, 10:08 AM

## 2016-04-10 ENCOUNTER — Ambulatory Visit: Payer: Medicare Other | Admitting: Certified Nurse Midwife

## 2016-05-15 ENCOUNTER — Other Ambulatory Visit: Payer: Self-pay | Admitting: Certified Nurse Midwife

## 2016-05-15 NOTE — Telephone Encounter (Signed)
Medication refill request: yuvafem  Last AEX:  04/09/15 DL Next AEX: 05/26/16 DL Last MMG (if hormonal medication request): 05/02/15 Left BIRADS2:Benign  Refill authorized: please advise.   Last refill 04/16/15 #24tabs /4R

## 2016-05-20 ENCOUNTER — Other Ambulatory Visit: Payer: Self-pay | Admitting: Pulmonary Disease

## 2016-05-20 MED ORDER — DILTIAZEM HCL ER COATED BEADS 120 MG PO CP24: 120.0000 mg | ORAL_CAPSULE | Freq: Every day | ORAL | 3 refills | Status: DC

## 2016-05-22 ENCOUNTER — Ambulatory Visit: Payer: Medicare Other | Admitting: Cardiovascular Disease

## 2016-05-26 ENCOUNTER — Ambulatory Visit (INDEPENDENT_AMBULATORY_CARE_PROVIDER_SITE_OTHER): Payer: Medicare Other | Admitting: Certified Nurse Midwife

## 2016-05-26 ENCOUNTER — Encounter: Payer: Self-pay | Admitting: Certified Nurse Midwife

## 2016-05-26 VITALS — BP 112/60 | HR 92 | Resp 16 | Ht 63.25 in | Wt 134.0 lb

## 2016-05-26 DIAGNOSIS — N951 Menopausal and female climacteric states: Secondary | ICD-10-CM | POA: Diagnosis not present

## 2016-05-26 DIAGNOSIS — Z01419 Encounter for gynecological examination (general) (routine) without abnormal findings: Secondary | ICD-10-CM | POA: Diagnosis not present

## 2016-05-26 DIAGNOSIS — N952 Postmenopausal atrophic vaginitis: Secondary | ICD-10-CM | POA: Diagnosis not present

## 2016-05-26 MED ORDER — ESTRADIOL 10 MCG VA TABS: 1.0000 | ORAL_TABLET | VAGINAL | 12 refills | Status: DC

## 2016-05-26 NOTE — Progress Notes (Signed)
83 y.o. [id number] Married  Caucasian Fe here for annual exam. Menopausal no vaginal bleeding, continues Yuvafem with good results, needs refill on RX. No diverticulitis flares in the past year, remains stable at this point. Sees PCP for aex/labs and hypertension. Has had left knee replacement and right shoulder repair in past year. All went well no issues. Continues follow up with Pulmonologist for asthma and lung issues. Spouse doing well and both still staying active. No health issues today. Daughter getting married this year!  Patient's last menstrual period was 02/23/1985.          Sexually active: No.  The current method of family planning is vasectomy.    Exercising: Yes.    Bike, walking Smoker:  no  Health Maintenance: Pap:  11/09/08 neg MMG:  05/26/16 - Per patient has to go back Thursday for additional test -- [facility] Colonoscopy:  2016 neg per patient  BMD:   2011 pcp manages TDaP:  2016 Shingles: 2015  Pneumonia: 2016 Hep C and HIV: not done Labs: PCP Self breast exam: occasional   reports that she quit smoking about 49 years ago. Her smoking use included Cigarettes. She has a 10.00 pack-year smoking history. She has never used smokeless tobacco. She reports that she drinks about 0.6 oz of alcohol per week . She reports that she does not use drugs.  Past Medical History:  Diagnosis Date  . Allergic rhinitis   . Anemia   . Atrial fibrillation ([place])    goes in and out of this rhythm- takes Tenormin  . Breast cancer, left breast ([place]) 1994 with lumpectomy   chemo,tamox  . Bronchiectasis with acute exacerbation ([place])   . COPD (chronic obstructive pulmonary disease) ([place])   . Diverticulosis of colon   . DJD (degenerative joint disease)   . Dysrhythmia    palpitations  . Family history of malignant neoplasm of gastrointestinal tract   . Fibromyalgia   . Hemoptysis   . History of pneumonia   . Hypercholesteremia   . Hypertension   . Low back pain syndrome   . Osteopenia   .  Pneumonia    hx  . Pseudomonas infection   . Rotator cuff syndrome of right shoulder   . Spinal stenosis     Past Surgical History:  Procedure Laterality Date  . COLONOSCOPY    . left breast lumpectomy and LN dissection  06/1992   Dr. [name]  . LUNG BIOPSY  1968   TSurg---Dr. [name]  . LUNG BIOPSY  2012   dr [name]  . REVERSE SHOULDER ARTHROPLASTY Right 08/02/2015   Procedure: RIGHT REVERSE TOTAL SHOULDER ARTHROPLASTY;  Surgeon: [name], MD;  Location: [facility];  Service: Orthopedics;  Laterality: Right;  . REVERSE TOTAL SHOULDER ARTHROPLASTY Right 08/02/2015  . TONSILLECTOMY    . TOTAL KNEE ARTHROPLASTY Right 01/21/2015   Procedure: TOTAL RIGHT KNEE ARTHROPLASTY;  Surgeon: [name], MD;  Location: [facility];  Service: Orthopedics;  Laterality: Right;  . TOTAL KNEE ARTHROPLASTY Left 03/23/2016   Procedure: LEFT TOTAL KNEE ARTHROPLASTY;  Surgeon: [name], MD;  Location: [facility];  Service: Orthopedics;  Laterality: Left;    Current Outpatient Prescriptions  Medication Sig Dispense Refill  . Calcium Carb-Cholecalciferol 600-800 MG-UNIT TABS Take 1 tablet by mouth daily.    . celecoxib (CELEBREX) 200 MG capsule Take 1 capsule (200 mg total) by mouth every 12 (twelve) hours. 60 capsule 0  . cholecalciferol (VITAMIN D) 1000 UNITS tablet Take 1,000 Units by mouth daily.    Marland Kitchen  diltiazem (CARDIZEM CD) 120 MG 24 hr capsule Take 1 capsule (120 mg total) by mouth daily. 90 capsule 3  . ferrous sulfate 325 (65 FE) MG tablet Take 1 tablet (325 mg total) by mouth 3 (three) times daily after meals.  3  . Fluticasone-Salmeterol (ADVAIR) 250-50 MCG/DOSE AEPB Inhale 1 puff into the lungs daily.    . Guaifenesin (MUCINEX MAXIMUM STRENGTH) 1200 MG TB12 Take 1,200 mg by mouth daily.     Marland Kitchen guaiFENesin (MUCINEX) 600 MG 12 hr tablet Take 1,200 mg by mouth 2 (two) times daily.    Marland Kitchen losartan (COZAAR) 50 MG tablet TAKE 1 TABLET EVERY DAY 30 tablet 5  . Magnesium 500 MG CAPS Take 1,000 mg by mouth at  bedtime.    . Multiple Vitamins-Minerals (MULTIVITAMIN & MINERAL PO) Take 1 tablet by mouth daily.      . Probiotic Product (ALIGN PO) Take 1 tablet by mouth daily.     Merril Abbe 10 MCG TABS vaginal tablet PLACE 1 TABLET VAGINALLY TWICE A WEEK 8 tablet 0   No current facility-administered medications for this visit.     Family History  Problem Relation Age of Onset  . Heart attack Mother     Deceased age 18  . Colon cancer Father     deceased age 62 from colon ca.  . Hypertension Brother   . Esophageal cancer Neg Hx   . Stomach cancer Neg Hx   . Rectal cancer Neg Hx     ROS:  Pertinent items are noted in HPI.  Otherwise, a comprehensive ROS was negative.  Exam:   BP 112/60 (BP Location: Right Arm, Patient Position: Sitting, Cuff Size: Normal)   Pulse 92   Resp 16   Ht 5' 3.25" (1.607 m)   Wt 134 lb (60.8 kg)   LMP 02/23/1985   BMI 23.55 kg/m  Height: 5' 3.25" (160.7 cm) Ht Readings from Last 3 Encounters:  05/26/16 5' 3.25" (1.607 m)  03/24/16 5\' 4"  (1.626 m)  03/18/16 5\' 4"  (1.626 m)    General appearance: alert, cooperative and appears stated age Head: Normocephalic, without obvious abnormality, atraumatic Neck: no adenopathy, supple, symmetrical, trachea midline and thyroid normal to inspection and palpation Lungs: clear to auscultation bilaterally Breasts: normal appearance, no masses or tenderness, No nipple retraction or dimpling, No nipple discharge or bleeding, No axillary or supraclavicular adenopathy, left breast scarring from breast cancer surgery 1994 Heart: regular rate and rhythm Abdomen: soft, non-tender; no masses,  no organomegaly Extremities: extremities normal, atraumatic, no cyanosis or edema Skin: Skin color, texture, turgor normal. No rashes or lesions Lymph nodes: Cervical, supraclavicular, and axillary nodes normal. No abnormal inguinal nodes palpated Neurologic: Grossly normal   Pelvic: External genitalia:  no lesions              Urethra:   normal appearing urethra with no masses, tenderness or lesions              Bartholin's and Skene's: normal                 Vagina: normal appearing vagina with normal color and discharge, no lesions              Cervix: multiparous appearance, no cervical motion tenderness and no lesions              Pap taken: No. Bimanual Exam:  Uterus:  normal size, contour, position, consistency, mobility, non-tender  Adnexa: normal adnexa and no mass, fullness, tenderness               Rectovaginal: Confirms               Anus:  normal sphincter tone, no lesions  Chaperone present: yes  A:  Well Woman with normal exam  Menopausal no HRT  Atrophic vaginitis using Yuvafem desires continuance  History of breast cancer 1994  Hypertension with PCP management  History of Diverticulitis no recent flares  Recent left knee replacement and right shoulder repair, no falls  P:   Reviewed health and wellness pertinent to exam  Aware of need to evaluate if vaginal bleeding  Discussed risks and benefits of Yuvafem and history of breast cancer. Patient aware, but has helped with UTI prevention also and still is sexually active. Will advise if any problems  Rx Yuvafem see order with instructions  Continue follow up with PCP and pulmonary as indicated.  Pap smear as above not taken   counseled on breast self exam, mammography screening, feminine hygiene, adequate intake of calcium and vitamin D, diet and exercise  return annually or prn  An After Visit Summary was printed and given to the patient.

## 2016-05-26 NOTE — Patient Instructions (Signed)

## 2016-05-27 ENCOUNTER — Encounter: Payer: Self-pay | Admitting: Certified Nurse Midwife

## 2016-05-28 ENCOUNTER — Encounter: Payer: Self-pay | Admitting: Pulmonary Disease

## 2016-05-29 NOTE — Progress Notes (Signed)
Encounter reviewed Miranda Schubring, MD   

## 2016-06-08 NOTE — Progress Notes (Signed)
Patient ID: Miranda Thompson, female   DOB: [birth year], 83 y.o.   MRN: [id number]   83 y.o. referred by Dr [name] for palpitations in March of 2015 . Has bad chronic lung disease with bronchiectasis Has had long standing skips  with atrial bigeminy seen on office ECG recently. At one time BP monitor showed pulse of 157 and patient felt a bit dizzy No syncope She does not wear oxygen Echo 2 years ago showed good LV and no pulmonary hypertension. She feels occasional flutterin in chest and senses that her HR is always high in 90's She does have some wheezing No chest pain chronic mild dyspnea  No sputum or fever Labs reviewed and TSH and HCT ok this month No history of MAT or PAF No bleeding diathesis   Started on Atenolol 05/17/13   Eventually complained of low endurance and stopped at daughters request  Cardizem started l for palpitations and HTN  BP monitor showed good control  Echo ok with normal EF 05/19/2013   Study Conclusions  - Left ventricle: The cavity size was normal. Systolic function was normal. The estimated ejection fraction was in the range of 55% to 60%. Wall motion was normal; there were no regional wall motion abnormalities. Doppler parameters are consistent with mildly elevated ventricular end-diastolic filling pressure. - Aortic valve: Trileaflet; mildly thickened leaflets. Transvalvular velocity was within the normal range. There was no stenosis. No regurgitation. - Aortic root: The aortic root was normal in size. - Mitral valve: Mildly thickened leaflets . Mild regurgitation. - Right ventricle: The cavity size was mildly dilated. Wall thickness was normal. Systolic function was normal. - Right atrium: The atrium was mildly dilated. - Atrial septum: No defect or patent foramen ovale was identified. - Tricuspid valve: Mild-moderate regurgitation. - Pulmonic valve: Mild regurgitation. - Pulmonary arteries: Systolic pressure was within the normal range. PA peak pressure:  34mm Hg (S).  No complaints    ROS: Denies fever, malais, weight loss, blurry vision, decreased visual acuity, cough, sputum, SOB, hemoptysis, pleuritic pain, palpitaitons, heartburn, abdominal pain, melena, lower extremity edema, claudication, or rash.  All other systems reviewed and negative  General: BP (!) 122/58   Pulse 87   Ht 5\' 3"  (1.6 m)   Wt 132 lb 6.4 oz (60.1 kg)   LMP 02/23/1985   SpO2 96%   BMI 23.45 kg/m   Affect appropriate Frail elderly female  HEENT: normal Neck supple with no adenopathy JVP normal no bruits no thyromegaly Lungs rhonchi diffuse no  wheezing and good diaphragmatic motion Heart:  S1/S2 no murmur, no rub, gallop or click PMI normal Abdomen: benighn, BS positve, no tenderness, no AAA no bruit.  No HSM or HJR Distal pulses intact with no bruits No edema Neuro non-focal Skin warm and dry No muscular weakness   Current Outpatient Prescriptions  Medication Sig Dispense Refill  . Calcium Carb-Cholecalciferol 600-800 MG-UNIT TABS Take 1 tablet by mouth daily.    . celecoxib (CELEBREX) 200 MG capsule Take 1 capsule (200 mg total) by mouth every 12 (twelve) hours. 60 capsule 0  . cholecalciferol (VITAMIN D) 1000 UNITS tablet Take 1,000 Units by mouth daily.    Marland Kitchen diltiazem (CARDIZEM CD) 120 MG 24 hr capsule Take 1 capsule (120 mg total) by mouth daily. 90 capsule 3  . Estradiol (YUVAFEM) 10 MCG TABS vaginal tablet Take 1 tablet (10 mcg total) by mouth 2 (two) times a week. 8 tablet 12  . ferrous sulfate 325 (65 FE) MG tablet  Take 1 tablet (325 mg total) by mouth 3 (three) times daily after meals.  3  . Fluticasone-Salmeterol (ADVAIR) 250-50 MCG/DOSE AEPB Inhale 1 puff into the lungs daily.    . Guaifenesin (MUCINEX MAXIMUM STRENGTH) 1200 MG TB12 Take 1,200 mg by mouth daily.     Marland Kitchen guaiFENesin (MUCINEX) 600 MG 12 hr tablet Take 1,200 mg by mouth 2 (two) times daily.    Marland Kitchen losartan (COZAAR) 50 MG tablet TAKE 1 TABLET EVERY DAY 30 tablet 5  . Magnesium  500 MG CAPS Take 1,000 mg by mouth at bedtime.    . Multiple Vitamins-Minerals (MULTIVITAMIN & MINERAL PO) Take 1 tablet by mouth daily.      . Probiotic Product (ALIGN PO) Take 1 tablet by mouth daily.      No current facility-administered medications for this visit.     Allergies  Patient has no known allergies.  Electrocardiogram:  04/30/13  SR atrial bigeminy normal ST segments   10/2014 SR rate 76  LAE  Normal  03/18/15 SR rate 87 LAE otherwise normal   Assessment and Plan Arrhythmia:  Related to lung disease Atenolol stopped  Stable  Bronchiectasis:  F/u [name] lung exam always abnormal no wheezing activity level good Vasc:  Taking 81 mg aspirin for prevention HTN:  Steroid injection in knees should be out of system.  Thinks beta blocker made her more tired  Better with cardizem and cozaar   Jenkins Rouge

## 2016-06-11 ENCOUNTER — Ambulatory Visit (INDEPENDENT_AMBULATORY_CARE_PROVIDER_SITE_OTHER): Payer: Medicare Other | Admitting: Cardiovascular Disease

## 2016-06-11 ENCOUNTER — Encounter: Payer: Self-pay | Admitting: Cardiovascular Disease

## 2016-06-11 VITALS — BP 122/58 | HR 87 | Ht 63.0 in | Wt 132.4 lb

## 2016-06-11 DIAGNOSIS — R002 Palpitations: Secondary | ICD-10-CM

## 2016-06-11 DIAGNOSIS — I491 Atrial premature depolarization: Secondary | ICD-10-CM | POA: Diagnosis not present

## 2016-06-11 NOTE — Patient Instructions (Addendum)

## 2016-07-06 ENCOUNTER — Other Ambulatory Visit: Payer: Self-pay | Admitting: Pulmonary Disease

## 2016-07-06 MED ORDER — DILTIAZEM HCL ER COATED BEADS 120 MG PO CP24: 120.0000 mg | ORAL_CAPSULE | Freq: Every day | ORAL | 3 refills | Status: DC

## 2016-08-05 ENCOUNTER — Encounter: Payer: Self-pay | Admitting: Pulmonary Disease

## 2016-08-05 ENCOUNTER — Ambulatory Visit (INDEPENDENT_AMBULATORY_CARE_PROVIDER_SITE_OTHER): Payer: Medicare Other | Admitting: Pulmonary Disease

## 2016-08-05 ENCOUNTER — Other Ambulatory Visit (INDEPENDENT_AMBULATORY_CARE_PROVIDER_SITE_OTHER): Payer: Medicare Other

## 2016-08-05 VITALS — BP 120/62 | HR 88 | Temp 97.1°F | Ht 63.0 in | Wt 132.0 lb

## 2016-08-05 DIAGNOSIS — F419 Anxiety disorder, unspecified: Secondary | ICD-10-CM

## 2016-08-05 DIAGNOSIS — J479 Bronchiectasis, uncomplicated: Secondary | ICD-10-CM | POA: Diagnosis not present

## 2016-08-05 DIAGNOSIS — Z96611 Presence of right artificial shoulder joint: Secondary | ICD-10-CM

## 2016-08-05 DIAGNOSIS — M159 Polyosteoarthritis, unspecified: Secondary | ICD-10-CM

## 2016-08-05 DIAGNOSIS — I498 Other specified cardiac arrhythmias: Secondary | ICD-10-CM | POA: Diagnosis not present

## 2016-08-05 DIAGNOSIS — D649 Anemia, unspecified: Secondary | ICD-10-CM

## 2016-08-05 DIAGNOSIS — M150 Primary generalized (osteo)arthritis: Secondary | ICD-10-CM | POA: Diagnosis not present

## 2016-08-05 DIAGNOSIS — M8949 Other hypertrophic osteoarthropathy, multiple sites: Secondary | ICD-10-CM

## 2016-08-05 DIAGNOSIS — Z96659 Presence of unspecified artificial knee joint: Secondary | ICD-10-CM

## 2016-08-05 LAB — COMPREHENSIVE METABOLIC PANEL
ALT: 12 U/L (ref 0–35)
AST: 19 U/L (ref 0–37)
Albumin: 4.3 g/dL (ref 3.5–5.2)
Alkaline Phosphatase: 75 U/L (ref 39–117)
BUN: 18 mg/dL (ref 6–23)
CO2: 28 mEq/L (ref 19–32)
Calcium: 9.8 mg/dL (ref 8.4–10.5)
Chloride: 96 mEq/L (ref 96–112)
Creatinine, Ser: 0.59 mg/dL (ref 0.40–1.20)
GFR: 103.92 mL/min (ref >60.00)
Glucose, Bld: 99 mg/dL (ref 70–99)
Potassium: 4.9 mEq/L (ref 3.5–5.1)
Sodium: 130 mEq/L — ABNORMAL LOW (ref 135–145)
Total Bilirubin: 0.8 mg/dL (ref 0.2–1.2)
Total Protein: 7.8 g/dL (ref 6.0–8.3)

## 2016-08-05 LAB — CBC WITH DIFFERENTIAL/PLATELET
Basophils Absolute: 0.1 10*3/uL (ref 0.0–0.1)
Basophils Relative: 1.3 % (ref 0.0–3.0)
Eosinophils Absolute: 0.2 10*3/uL (ref 0.0–0.7)
Eosinophils Relative: 2.5 % (ref 0.0–5.0)
HCT: 37.7 % (ref 36.0–46.0)
Hemoglobin: 13.2 g/dL (ref 12.0–15.0)
Lymphocytes Relative: 16.9 % (ref 12.0–46.0)
Lymphs Abs: 1.1 10*3/uL (ref 0.7–4.0)
MCHC: 35 g/dL (ref 30.0–36.0)
MCV: 95.9 fl (ref 78.0–100.0)
Monocytes Absolute: 0.7 10*3/uL (ref 0.1–1.0)
Monocytes Relative: 10.8 % (ref 3.0–12.0)
Neutro Abs: 4.3 10*3/uL (ref 1.4–7.7)
Neutrophils Relative %: 68.5 % (ref 43.0–77.0)
Platelets: 275 10*3/uL (ref 150.0–400.0)
RBC: 3.93 Mil/uL (ref 3.87–5.11)
RDW: 13.2 % (ref 11.5–15.5)
WBC: 6.4 10*3/uL (ref 4.0–10.5)

## 2016-08-05 LAB — IBC PANEL
Iron: 169 ug/dL — ABNORMAL HIGH (ref 42–145)
Saturation Ratios: 46.4 % (ref 20.0–50.0)
Transferrin: 260 mg/dL (ref 212.0–360.0)

## 2016-08-05 LAB — TSH: TSH: 2.4 u[IU]/mL (ref 0.35–4.50)

## 2016-08-05 LAB — FERRITIN: Ferritin: 63.8 ng/mL (ref 10.0–291.0)

## 2016-08-05 LAB — VITAMIN D 25 HYDROXY (VIT D DEFICIENCY, FRACTURES): VITD: 39.59 ng/mL (ref 30.00–100.00)

## 2016-08-05 LAB — MAGNESIUM: Magnesium: 2.1 mg/dL (ref 1.5–2.5)

## 2016-08-05 NOTE — Progress Notes (Signed)
Patient ID: Miranda Thompson, female   DOB: [birth year], 83 y.o.   MRN: [id number]  Subjective:    Patient ID: Miranda Thompson, female    DOB: Jul 27, 1933, 83 y.o.   MRN: [id number]  HPI 83 y/o WF here for a follow up visit... she has mult med problems as noted below...   ~  SEE PREV EPIC NOTES FOR OLDER DATA >>    Hx left breast cancer w/ XRT & chemorx in 1994  08/19/04>  CT Chest showed extensive area of marked cylindrical bronchiectasis on the left, some assoc calcif, no signif adenopathy, prior left breast surg...  LABS 3/14:  FLP- at goals on diet alone;  Chems- wnl;  CBC- wnl;  TSH=2.37;  VitD=31;  UA- clear...  SPUTUM 4/14 >> Pseudomonas sensitive to all antibiotics   LABS 9/14:  CBC- ok w/ Hg=13.7, WBC=6.4;  Fe=91 (25%sat)... BMet wasn't done...   CXR 3/15 showed stable film w/ scarring LUL, no change and surg clips left breast & axilla  EKG 3/15 showed NSR w/ PACs and atrial bigem, 1st degree AVB, otherw NAD...   2DEcho 3/15 showed norm LV size & function w/ EF=55-60%, norm wall motion, valves ok w/ sl thickening of leaflets- mildMR, modTR,; mild RV dil but norm RVF & PAsys=31  LABS 3/15:  FLP- ok on diet alone;  Chems- wnl;  CBC- wnl;  Fe=69 (19%sat);  VitD=49;  TSH=4.33... UA is clear, wnl...   ~  November 08, 2013:  83moROV & Miranda Thompson had an exac 6/15- seen by TP, CXR stable (NAD), treated w/ Levaquin/ Mucinex & improved, but she stopped her Advair & now notes sl incr SOB;  PFT w/ FEV1=1.22 (61%) & she is rec to restart her inhaler;  Notes chr cough w/ green-gray phlegm from her severe bronchiectasis (last pos sputum was 4/14 +Pseudomonas- pansensitive);  EXAM shows bilat rhonchi, no consolidation, and she is stable on her regular regimen and vigorous chest physiotherapy regimen to aide sputum production...     She had f/u DrNishan 4/15> hx palpit, bigeminy on EKG, improved on Aten25; no changes made...    She continues to follow up w/ DrFields> right knee pain, wears brace &  gets shots periodically, his notes are reviewed...  We reviewed prob list, meds, xrays and labs> see below for updates >> OK 2015 Flu vaccine today...   PFT 9/15 showed FVC= 2.08 (77%), FEV1= 1.22 (61%), %1sec= 59%, mid-flows= 31% predicted... c/w GOLD Stage2 COPD, pt GroupB...  Ambulatory O2 Test>  O2sat on RA at rest= 96% w/ pulse=73/min;  Ambulated 3 laps w/ lowest O2sat= 96% w/ pulse=94/min.... PLAN>> continue same Advair250Bid, Mucinex 1200Bid, Fluids, vigorous home chest PT & postural drainage, stay as active as poss...  ~  May 09, 2014:  81moOV & Miranda Thompson reports a good interval- no intercurrent infections, breathing is about the same but she notes some sl incr DOE assoc w/ decr exercise due to knee arthritis; she sees drFields and as had several shots but wonders about TKR- given the names for DrAlusio & DrOlin as contacts... We reviewed the following medical problems during today's office visit >>     Bronchiectasis & HxPneumonia> on Advair250Bid (not taking regularly) & GFN 1200Bid; PFT 9/15 w/ GOLD stage2-B COPD; mod cough, grey colored sput, intermit hemoptysis, denies f/c/s; last pos culture was 4/14= Pseudomonas, pan-sens & treated w/ Cipro at that time; she does regular ChestPT...    Palpitations & PACs> she had PACs & atrial bigeminy; improved on  Atenolol25; seen by Cherly Hensen 4/15 & 12/15- no AFib or MAT & no changes made...    CHOL> on diet alone; FLP 3/15 showed TChol 172, TG 50, HDL 56, LDL 106    GI- Divertics> asked to take Align, Metamucil, etc; she had diverticulitis flair 2012 x2 & treated by DrPatterson w/ Cipro & Flagyl    GU- HxUTI w/ pelvic pressure, back pain, freq&urgency> felt to have a chronic cystitis by DrEskridge & she understands asymptomatic bactiuria...    Hx Breast Cancer> she had surg 1994 w/ lumpectomy & LN dissection followed by XRT & Chemorx; she was released [facility]; followed by [facility]- seen 12/15 w/ neg exam & mammogram 3/16-  OK...    DJD, LBP, Osteopenia> on Tylenol, Estradiol topically per Gyn, calcium, MVI, VitD1000; she walks >15m/d for exercise; followed by DrFields regularly; c/o incr right knee pain & she requested Ortho names for poss TKR (Alusio/ OAlvan Dame; [facility] DrRomaine=> now seeing DrSMiller...    HxEczema> she uses CoQ10,Borage Oil & black current; she likes herbal remedies...  We reviewed prob list, meds, xrays and labs> see below for updates >>   CXR 3/16 is stable- chronic changes on left, clips from prev left breast surg, DDD in Tspine...  LABS 3/16:  FLP- wnl on diet alone;  Chems- wnl;  CBC- wnl;  TSH=4.99...  ~  November 06, 2014:  83moOV & medical f/u visit>     From the pulmonary standpoint she is stable w/ her severe bronchiectasis but only using her Advair250 once daily & only taking the Mucinex 60053md; asked to incr the Advair250Bid & the Mucinex600-2Bid w/ fluids and maintain a vigorous chest PT & postural drainage routine to clear her airways...    She had f/u CARDS- DrNishan 10/25/14> hx palpit w/ atrial bigeminy on office EKG; prev tried Aten but stopped due to low endurance, 2DEcho 04/2013 showed norm EF=55-60%, no wall motion abn, mildMR, PAsys=31; he added CARDIZEM-CD 240m38mbut pt states that she didn't take it stating that she "only wants low-dose meds" => therefore rec to try CARDIZEN-CD 120mg30m.    She also notes that her BP is "all over the place" but BP monitor was apparently ok; she read her EKG on my chart & had a lot of questions; currently not taking any BP med & BP today= 140/80; I told her that the Cardizem would help regulate her blood pressure as well as help control her palpitations...     She had a routine f/u colonoscopy 08/08/14 by DrPyrtle> +FamHx colon cancer in father; 2 sessile polyps removed, 3-5mm s90m, mod diverticulosis, Path= tubular adenomas...    She saw DrFields for her knees w/ grade4 arthritis on right, cortisone shots lasted  several months, uses compression sleeve & Aleve/ Tramadol; he referred her to DrOlinGeorgetownotes "bone on bone" & rec bilat TKRs...  EXAM shows Afeb, VSS, O2sat=96% on RA;  HEENT- neg, mallampati1;  Chest- diffuse bilat rhonchi/ congestion, w/o wheezing or signs of consolidation;  Heart- RR gr1/6 SEM w/o r/g;  Abd- soft, neg;  Ext- w/o c/c/e...  EKG 06/19/14 showed NSR, rate78, poor R progression V1-3, otherw neg/NAD...  We reviewed prev CXR, LABS, etc... IMP/PLAN>>  She reminded me that her daughter didn't want her on Atenolol, & she only wants "low-dose" meds therefore try CARDIZEM-CD 120mg/d44meeds to incr the Advair250 to Bid & the Mucinex 600mg to2md w/ fluids + continue the postural  drainage/ chest PT regimen... OK 2016 flu vaccine today.  ~  December 18, 2014:  6wk ROV & recheck> Miranda Thompson has been taking the CardizemCD120 since last OV & watching her home BP checks very carefully; pressure readings are somewhat labile- 110/70 range to 160/100+ range using her home cuff; she remains asymptomatic w/o HA, CP, palpit, dizzy, edema, etc; exercise is limited by her knees and she tells me she is ready for TKR surg by DrOlin; we decided to add LOSARTAN34m/d for her BP control & she will continue to monitor it at home... EXAM shows Afeb, VSS, O2sat=96% on RA;  BP=132/74; HEENT- neg, mallampati1;  Chest- diffuse bilat rhonchi w/o wheezing or signs of consolidation;  Heart- RR gr1/6 SEM w/o r/g;  Abd- soft, neg;  Ext- w/o c/c/e... PLAN>>  She will continue all current meds and ADD LOSARTAN 519md; watch BPs at home & call for questions; she asks that I send this note to DrWabaunseeor surg from the medical standpoint...  ~  March 20, 2015:  77m67moV & Aliya had her right TKR by DrOlin 01/21/15, s/p PT, still sl stiff but exercising on bike & walking some- no complications and she is very pleased;  BP is improved w/ the addition of Losartan50 & measures 142/62 today... We reviewed the following medical  problems during today's office visit >>     Bronchiectasis & HxPneumonia> on Advair250Bid (not taking regularly) & GFN 1200Bid; PFT 9/15 w/ GOLD stage2-B COPD; mod cough, grey colored sput, intermit hemoptysis, denies f/c/s; last pos culture was 4/14= Pseudomonas, pan-sens & treated w/ Cipro at that time; she does regular ChestPT which really helps...    Palpitations & PACs> she had PACs & atrial bigeminy; improved on Atenolol25; followed by DrNCherly Hensenchanged to CardizemCD120 (she refused 240 dose), last seen 03/18/15- no AFib or MAT & no changes made...    CHOL> on diet alone; FLP 3/16 showed TChol 163, TG 70, HDL 53, LDL 96    GI- Divertics> asked to take Align, Metamucil, etc; she had diverticulitis flair in 2012 x2 & treated by DrPatterson w/ Cipro & Flagyl, ok since then.    GU- HxUTI w/ pelvic pressure, back pain, freq&urgency> felt to have a chronic cystitis by DrEskridge & she understands asymptomatic bactiuria...    Hx Breast Cancer> she had surg 1994 w/ lumpectomy & LN dissection followed by XRT & Chemorx; [facility]/ neg exam & mammogram 3/16- OK...    DJD, LBP, Osteopenia> on Tylenol, Estradiol topically per Gyn, calcium, MVI, VitD1000; she walks >1mi777mfor exercise; followed by DrFields & OlinAlvan Damep right TKR 12/2014; [facility]...     HxEczema> she uses CoQ10,Borage Oil & black current; she likes herbal remedies...  EXAM shows Afeb, VSS, O2sat=96% on RA;  HEENT- neg, mallampati1;  Chest- rhonchi L>R, w/o w/r/consolidation;  Heart- RR w/o m/r/g;  Abd- soft, nontender, neg;  Ext- s/p R-TKR, w/o c/c/e;  Neuro- intact...  EKG 03/18/15 showed NSR, rate87, prob LAE, poor R prog V1-3, otherw STTW are ok...  LABS 03/20/15> we discussed FASTING labs on return... IMP/PLAN>>  Mult medical problems as listed- Miranda Thompson & did well w/ the knee replacement surg; continue same meds and  her vigorous chest PT regimen; we plan recheck in 3-63mo 53moasting blood work.  ~  August 01, 2015:  4-53mo R67mo MarilyStarlaer  right TKR 12/2014 by DrOlin & is now sched for a right reverse total shoulder arthroplasty by DrNorris tomorrow!  She tells me that she still needs to have her left knee done;  She notes that her breathing is OK w/ mild chr cough, min sput production, & DOE w/o change (she walks, rides bike, etc);  She recently ret for FLabs;  We reviewed the following medical problems during today's office visit >>     Bronchiectasis & HxPneumonia> on Advair250Bid & GFN 1200Bid; she is supposed to be doing Flutter treatments and postural drainage as well; PFT 9/15 w/ GOLD stage2-B COPD; mod cough, grey colored sput, intermit hemoptysis, denies f/c/s; last pos culture was 4/14= Pseudomonas, pan-sens & treated w/ Cipro at that time; the regular ChestPT which really helps...    Palpitations & PACs> she had PACs & atrial bigeminy; improved on Atenolol25 (but c/o low endurance); followed by Cherly Hensen & changed to CardizemCD120 (she refused 240 dose) + Losar50, last seen 03/18/15- no AFib or MAT & no changes made...    CHOL> on diet alone; FLP 6/17 showed TChol 174, TG 64, HDL 62, LDL 100    GI- Divertics> asked to take Align, Metamucil, etc; she had diverticulitis flair in 2012 x2 & treated by DrPatterson w/ Cipro & Flagyl, ok since then.    GU- HxUTI w/ pelvic pressure, back pain, freq&urgency> felt to have a chronic cystitis by DrEskridge & she understands asymptomatic bactiuria...    Hx Breast Cancer> surg 1994 w/ lumpectomy & LN dissection followed by XRT & Chemorx; she was released by [facility] in 2003; followed by [place]- seen 12/15 w/ neg exam & mammogram 3/16- OK...    DJD, LBP, Osteopenia> on Tylenol, Estradiol topically per Gyn, calcium, MVI, VitD1000; she walks >78m/d for exercise; followed by DrFields & OAlvan Dame s/p right TKR 12/2014; [facility] her  Gyn...     HxEczema> she uses CoQ10, Borage Oil & black current; she likes herbal remedies...  EXAM shows Afeb, VSS, O2sat=98% on RA;  HEENT- neg, mallampati1;  Chest- rhonchi L>R, w/o w/r/consolidation;  Heart- RR w/o m/r/g;  Abd- soft, nontender, neg;  Ext- s/p R-TKR, decr ROM right shoulder, w/o c/c/e;  Neuro- intact...  FLABS 07/29/15>  FLP- all parameters at goals on diet alone;  Chems- wnl x Na=132;  CBC- ok w/ Hg=12.2, MCV=97;  TSH=2.61.. IMP/PLAN>>  MMichais stable w/ her severe bronchiectasis & reminded to be diligent w/ her meds and Chest PT treatments;  she is sched for the next of her planned orthopedic procedures;  We plan ROV in 630mosooner if needed prn.  ~  August 14, 2015:  2wk ROV & add-on appt requested for increased cough, green sput production, and difficulty managing the congestion;  She tells me that she had her right shoulder replacement 2 wks ago & since then has been unable to do her postural drainage for her bronchiectasis resulting in incr cough, gren/gray mucus, incr chest congestion, low grade temp & dyspnea; she says her chest feels full;  Home meds include> ADVAIR250Bid, & Mucinex600-2Bid. But she also does a vigorous chest PT regimen w/ flutter valve treatments and postural drainage...     EXAM shows Afeb, VSS, O2sat=97% on RA;  HEENT- neg, mallampati1;  Chest- congested, rhonchi L>R, w/o w/r/consolidation;  Heart- RR w/o m/r/g;  Ext- s/p R-TKR, s/p R shoulder surg/ sling,..   Sput C&S>  Culture grew PSEUDOMONAS (again) & it remains sens to CIPRO...Marland KitchenMarland Kitchen  IMP/PLAN>>  Pt given Duoneb treatment here in office w/ mod thick green sputum=> sent for C&S which grew Pseudomonas sens to Cipro; we treated her w/ Cipro500Bid & NEB w/ Duoneb Tid regularly; she knows to return to her postural drainage treatments as soon as her shoulder recuperation will allow...   ~  February 03, 2016:  42moRViolacalled several times thru 12/2015 1st notingabscessed tooth treated by her dentist w/  Amocicillin x10d; then she developed a sore throat, URI, cough w/ yellow sput & low grade fever; we had her bring in a sput specimen (initially mult species, but later reported Psuedomonas R to LRaleigh and treated her w/ Levaqun 7578m10d + yogurt; she had some intermittent cough w/ streaky hemoptysis, continued her vigorous home COPD regimen & gradually improved... Her Left-TKR surg is now sched for 03/23/16 per DrOlin...    Bronchiectasis & HxPneumonia> on Advair250Bid & GFN 1200Bid; she has refused NEBULIZER meds; she is supposed to be doing Flutter treatments and postural drainage as well; PFT 9/15 w/ GOLD stage2-B COPD; mod cough, grey colored sput, intermit hemoptysis, denies f/c/s; last pos culture was 4/14= Pseudomonas, pan-sens & treated w/ Cipro at that time; the regular ChestPT which really helps...    Palpitations & PACs> she had PACs & atrial bigeminy; improved on Atenolol25 (but c/o low endurance); followed by DrCherly Hensen changed Aten to CardizemCD120 (she refused 240 dose) + Losar50, last seen 03/18/15- no AFib or MAT & no changes made...    EXAM shows Afeb, VSS, O2sat=97% on RA;  HEENT- neg, mallampati1;  Chest- congested, rhonchi L>R, w/o w/r/consolidation;  Heart- RR w/o m/r/g;  Ext- s/p R-TKR, s/p R shoulder surg;  Neuro- neg, intact...  Sput C&S 01/01/16>  Mod PSEUDOMONAS- resist to Cipro/ Levaq and sens- Fortaz, Primaxin, Piperacillin...   CXR 01/28/16>  Norm heart size, hyperinflated w/ chr changes of scarring & bronchiectasis on left- NAD, osteopenia, right shoulder prosthesis, clips over left breast & left axilla  LABS 01/28/16>  Chems- wnl;  CBC- Hg=12.4, otherw wnl... IMP/PLAN>>  It would be additionally beneficial for her to be on a NEB e/ med Tid=Qid before her chest PT/ postural drainage treatments but she is quite sure she cannot tolerate the neb rx;  Her orthopedic issues mount & every surgery/ every general anesthetic takes a toll;  Rec to continue Advair250Bid, Mucinex  1200Bid, fluids, and the chest Rx...   ~  August 05, 2016:  76m39moV & Miranda Thompson's CC is some loose stool occurring every 2-3d, [facility] Metamucil rx in less water & stop her Mag500-2/d supplement;  Otherw feeling OK sl dry cough at night, small amt gray-green phlegm, no blood, notes SOB/ DOE w/ stairs/ walking; no CP/ palpit, etc on her ex-bike... We reviewed interval Epic notes >>     She had left TKR by DrOlin 03/23/16>  Did well w/ surg & disch w/ [facility] therapy...    She saw GYN-DebLeonard 05/26/16 for her well-woman exam>  [id number], using Yuvafem, no issues...    She saw CARDS-DrNishan 06/11/16>  Hx palpit, atrial bigeminy on office EKG, prev 2DEcho was ok- PAsys=31; no hx MAT or AFib; good control of BP on Cardizem/Losartan;  Felt to be stable- no changes made...  We reviewed the following medical problems during today's office visit >>     Bronchiectasis & HxPneumonia> on Advair250Bid & GFN 1200Bid; she is supposed to be doing Flutter treatments and postural drainage  as well; PFT 9/15 w/ GOLD stage2-B COPD; mod cough, grey colored sput, intermit hemoptysis, denies f/c/s; last pos culture was 4/14= Pseudomonas, pan-sens & treated w/ Cipro at that time; the regular ChestPT which really helps...    Palpitations & PACs> she had PACs & atrial bigeminy; improved on Atenolol25 (but c/o low endurance); followed by Cherly Hensen & changed to CardizemCD120 (she refused 240 dose) + Losar50, last seen 05/2016- no AFib or MAT & no changes made...    CHOL> on diet alone; FLP 6/17 showed TChol 174, TG 64, HDL 62, LDL 100    GI- Divertics> asked to take Align, Metamucil, etc; she had diverticulitis flair in 2012 x2 & treated by DrPatterson w/ Cipro & Flagyl, ok since then.    GU- HxUTI w/ pelvic pressure, back pain, freq&urgency> felt to have a chronic cystitis by DrEskridge & she understands asymptomatic bactiuria...    Hx Breast Cancer> surg 1994 w/ lumpectomy & LN dissection  followed by XRT & Chemorx; she was released by [facility] in 2003; followed by [place]- seen 05/2016 w/ neg exam & mammogram 4/18- OK...    DJD, LBP, Osteopenia> on Tylenol, Estradiol topically per Gyn, calcium, MVI, VitD1000; she walks >21m/d for exercise; followed by DrFields & OAlvan Dame s/p right TKR 12/2014 & left TKR 02/2016; [facility].    HxEczema> she uses CoQ10, Borage Oil & black current; she likes herbal remedies...  EXAM shows Afeb, VSS, O2sat=97% on RA;  HEENT- neg, mallampati1;  Chest- rhonchi L>R, w/o w/r/consolidation;  Heart- RR w/o m/r/g;  Abd- soft, nontender, neg;  Ext- s/p R-TKR, decr ROM right shoulder, w/o c/c/e;  Neuro- intact...  Labs 08/05/16>  Chems- ok x Na=130, Cr=0.59, LFTs wnl;  Mg=2.1;  CBC- wnl w/ Hg=13.2, mcv=96;  Fe=169 (46%sat), Ferritin=64;  TSH=2.40;  VitD=40 > same meds, consider some gatorade... IMP/PLAN>>  MBurnettais rec to stop the Mag supplement & add metamucil w/ sm amt water;  We reviewed her vigorous bronchiectasis regimen w/ Advair, GFN, fluids, chest PT & postural drain;  Continue other meds the same & call for any breathing issues...            Problem List:     ALLERGIC RHINITIS (ICD-477.9) - she uses OTC antihistamines Prn.  BRONCHIECTASIS (ICD-494.0) & Hx of PNEUMONIA (ICD-486) - on ADVAIR 250 Bid, & MUCINEX 1-2Bid...  Long hx severe bronchiectasis, granulomatous lung dis, and pneumonia (initially Dx 1968 by DrFrazier, subseq bx= noncaseating gran dz & chr inflam)... her last bronchoscopy was 4/04 revealing some mucus plugging... CTChest 4/04 showed marked bronchiectasis in lingular, LLL, RML, and mucus plugging... CT repeated 6/06- similar. ~  Sputum cultures 5/08 grew Serratia (R to amox/ceph, S to all others), branching part acid fast bacilli resembling nocardia, yeast c/w candida... ~  CXR 1/09 chr lung dis, no change from prev w/ left mid lung zone as the worst area... ~  CXR 3/10 w/ chr lung changes and NAD  suspected... ~  Sputum 8/10 showed NTF only & AFB smears & cult all neg... ~  CXR 3/11 showed chr lung dis, no acute changes, stable... ~  CXR 3/12 showed chronic changes, scarring, NAD..Marland Kitchen ~  CXR 3/13 showed essentially no change> normal heart size, prom pulm arts, bilat interstitial prominence & nodularity, bronchiectasis, NAD, surg clips from prev left breast ca surg. ~  9/13: she presents w/ an acute exac w/ cough, thick sput, congestion, fever, SOB, & chest discomfort; CXR shows worse  left mid zone opacity; We decided to treat w/ empiric Levaquin x10d pending cult report==> ret w/ resist Pseudomonas, but pt states better/ improved on the Levaquin. ~  Sputum C&S 4/14 showed Pseudomonas sens to Cefepime, Tressie Ellis, Cipro, Levaquin, etc ~  3/15 & 9/15: on Advair250Bid (not taking regularly) & GFN 1200Bid; mod cough, grey colored sput, intermit hemoptysis, denies f/c/s; last pos culture was 4/14= Pseudomonas, pan-sens & treated w/ Cipro. ~  CXR 3/15 showed stable film w/ scarring LUL, no change and surg clips left breast & axilla... ~  PFTs 9/15 showed FVC= 2.08 (77%), FEV1= 1.22 (61%), %1sec= 59%, mid-flows= 31% predicted... c/w GOLD Stage2 COPD, pt GroupB... ~  3/16: on Advair250Bid & GFN 1200Bid (asked to take regularly); PFT 9/15 w/ GOLD stage2-B COPD; mod cough, grey colored sput, intermit hemoptysis, denies f/c/s; last pos culture was 4/14= Pseudomonas, pan-sens & treated w/ Cipro at that time; she does regular ChestPT... ~  CXR 3/16 is stable- chronic changes on left, clips from prev left breast surg, DDD in Tspine ~  9/16: she is reminded to use the Advair250Bid & the Mucinex600-2Bid w/ fluids and maintain a vigorous chest PT & postural drainage regimen...  PALPITATIONS >> followed by Barton Fanny on Atenolol25 & improved... ~  2DEcho 3/15 showed norm LV size & function w/ EF=55-60% and no regional wall motion abn, mild MR, mild TR, PAsys=12mHg... ~  9/16: she reminds me that daughter does not  want her on Atenolol; DrNishan ordered CardizemCD240 but she "only wants low dose meds"; therefore try CardizemCD120/d... ~  10/16: pt returns w/ extensive BP records on her CardizemCD120/d; BP today= 132/74 but BP at home up to 160/100+ and we decided to add LOSATAN 555md to her regimen...   HYPERCHOLESTEROLEMIA, BORDERLINE (ICD-272.4) - on diet alone... ~  FLP 9/10 showed TChol 174, TG 64, HDL 49, LDL 112 ~  FLP 9/11 showed TChol 175, TG 61, HDL 48, LDL 115 ~  FLP 3/13 showed TChol 176, TG 69, HDL 64, LDL 98 ~  FLP 9/13 showed TChol 169, TG 65, HDL 48, LDL 108 ~  FLP 3/14 showed TChol 157, TG 54, HDL 46, LDL 100 ~  FLP 3/15 showed TChol 172, TG 50, HDL 56, LDL 106 ~  FLP 3/16 showed TChol 163, TG 70, HDL 53, LDL 96  Hx of ADENOCARCINOMA, BREAST (ICD-174.9) - [facility] L breast lesion and LN dissection (stage II 1.0  cm tumor), w/ post op XRT and chemotherapy... [facility]... then DrLivesay (released in 2003). ~  routine Mammogram 2/10 was neg- f/u planned 1y79yr  f/u mammogram 2/11 = neg, NAD, f/u planned 44yr72yr f/u mammogram 2/12 = neg, NAD, f/u 44yr.27yrf/u mammogram 2/13 = neg, NAD... ~ Marland Kitchenf/u mammogram 2/14 = neg, scat parenchymal densities... ~  [facility]... ~ Marland Kitchenf[facility]....  DIVERTICULOSIS OF COLON (ICD-562.10) - colonoscopy 3/05 by DrPatterson showed divertics only... she has +fam hx colon ca w/ colon check q5yrs.56yr/u colonoscopy 5/11 showed +divertics, no polyps... ~  3/12:  Hx, exam & CTAbd c/w diverticulitis; treated w/ Cipro/ Flagyl & resolved;  Advised Metamucil, Miralax, etc. ~  11/12:  She had f/u DrPatterson after another bout of diverticulitis; he notes regular BMs, good appetite, stable weight; he wanted her to STOP Naprosyn, ok to use Tylenol/ Tramadol, rec hi fiber, [place]. ~  She had a routine f/u colonoscopy 08/08/14 by DrPyrtle>  +FamHx colon cancer in father; 2 sessile polyps removed, 3-1m size, mod diverticulosis, Path= tubular adenomas...  DEGENERATIVE JOINT DISEASE (ICD-715.90) - see Ortho eval 2010 by DrDaldorf... she takes Glucosamine & Herbal supplements- milk thistle/ tumeric which she says really helps... ~  4/14: she saw DrKarlFields for SportsMed> c/o bilat knee pain, known arthritis, uses body helix knee (compression) sleeves & Tramadol prn; takes tart red cherry oil daily & he rec devil's claw, tumeric, & curcumin... ~  3/15: she continues to f/u w/ drFields for knee shots periodically... ~  3/16: she is requesting [place] of Ortho [place].. ~  She is managed by DrFields and DrOlin => told she needs TKRs & we will send medical clearance...  LOW BACK PAIN SYNDROME (ICD-724.2) - [facility] showed herniated disc L3-4 w/ mod spinal stenosis and lat recess stenosis bilat...   ROTATOR CUFF SYNDROME, RIGHT (ICD-726.10) - [facility].  OSTEOPENIA (ICD-733.90) - [facility], norm hip, osteopenic forearm (-2.0)... followed by GYN = DrRomaine who is treating a low Vit D level... she takes calcium, Vitamins, & [place] ~  labs 9/10 on 50K weekly showed Vit D level = 42... rec> change to 1000 u daily. ~  labs 9/11 on 1000 u daily showed Vit D level = 34... rec> continue daily supplement. ~  Labs 3/13 showed Vit D level = 35... Continue supplements. ~  Labs 3/14 showed Vit D level = 31 ~  Labs 3/15 showed Vit D level = 49... Continue supplements  Hx of ANEMIA (ICD-285.9) -  resolved... ~  labs 9/10 showed Hg= 13.6 ~  labs 9/11 showed Hg= 13.2 ~  Labs 3/13 showed Hg= 13.4 ~  Labs 3/14 showed Hg= 13.3 ~  Labs 3/15 showed Hg= 13.4 ~  Labs 3/16 showed Hg= 13.1  ECZEMA - she has hx of eczema on her hands and has used CoQ10 and Borage Oil as rec by an [place]Administratorin [place]..  [facility] instead of these other supplements & rash is much improved...   Past Surgical History:  Procedure Laterality Date  . COLONOSCOPY    . left breast lumpectomy and LN dissection  06/1992   Dr. NLucia Gaskins . LUNG BIOPSY  1968   TSurg---Dr. FMare Ferrari . LUNG BIOPSY  2012   dr nLenna Gilford . REVERSE SHOULDER ARTHROPLASTY Right 08/02/2015   Procedure: RIGHT REVERSE TOTAL SHOULDER ARTHROPLASTY;  Surgeon: SNetta Cedars MD;  [place]  Service: Orthopedics;  Laterality: Right;  . REVERSE TOTAL SHOULDER ARTHROPLASTY Right 08/02/2015  . TONSILLECTOMY    . TOTAL KNEE ARTHROPLASTY Right 01/21/2015   Procedure: TOTAL RIGHT KNEE ARTHROPLASTY;  Surgeon: MParalee Cancel MD;  [facility];  Service: Orthopedics;  Laterality: Right;  . TOTAL KNEE ARTHROPLASTY Left 03/23/2016   Procedure: LEFT TOTAL KNEE ARTHROPLASTY;  Surgeon: MParalee Cancel MD;  [facility];  Service: Orthopedics;  Laterality: Left;    Outpatient Encounter Prescriptions as of 08/05/2016  Medication Sig  . Calcium Carb-Cholecalciferol 600-800 MG-UNIT TABS Take 1 tablet by mouth daily.  . celecoxib (CELEBREX) 200 MG capsule Take 1 capsule (200 mg total) by mouth every 12 (twelve) hours.  . cholecalciferol (VITAMIN D) 1000 UNITS tablet Take 1,000 Units by mouth daily.  .Marland Kitchendiltiazem (CARDIZEM CD) 120 MG 24 hr capsule  Take 1 capsule (120 mg total) by mouth daily.  . Estradiol (YUVAFEM) 10 MCG TABS vaginal tablet Take 1 tablet (10 mcg total) by mouth 2 (two) times a week. (Patient taking differently: Place 1 tablet vaginally 2 (two) times a week. )  . Fluticasone-Salmeterol (ADVAIR) 250-50 MCG/DOSE AEPB Inhale 1 puff into the lungs daily.  Marland Kitchen guaiFENesin (MUCINEX) 600 MG 12 hr tablet Take 1,200 mg by mouth 2 (two) times daily.  Marland Kitchen losartan (COZAAR) 50 MG tablet TAKE 1 TABLET EVERY DAY  . Magnesium 500 MG CAPS Take 1,000 mg by mouth at bedtime.  . Multiple Vitamins-Minerals (MULTIVITAMIN & MINERAL PO) Take 1 tablet  by mouth daily.    . Probiotic Product (ALIGN PO) Take 1 tablet by mouth daily.   . [DISCONTINUED] ferrous sulfate 325 (65 FE) MG tablet Take 1 tablet (325 mg total) by mouth 3 (three) times daily after meals. (Patient not taking: Reported on 08/05/2016)  . [DISCONTINUED] Guaifenesin (MUCINEX MAXIMUM STRENGTH) 1200 MG TB12 Take 1,200 mg by mouth daily.    No facility-administered encounter medications on file as of 08/05/2016.     No Known Allergies    Immunization History  Administered Date(s) Administered  . Influenza Split 12/03/2011  . Influenza Whole 11/30/2007, 11/30/2008, 12/03/2009, 11/24/2010  . Influenza, High Dose Seasonal PF 11/19/2015  . Influenza,inj,Quad PF,36+ Mos 11/10/2012, 11/08/2013, 11/06/2014  . Pneumococcal Conjugate-13 05/09/2014  . Pneumococcal Polysaccharide-23 11/07/2007  . Tdap 05/09/2014     Current Medications, Allergies, Past Medical History, Past Surgical History, Family History, and Social History were reviewed in [employer] record.     Review of Systems         See HPI - all other systems neg except as noted...  The patient complains of dyspnea on exertion and abdominal pain.  The patient denies anorexia, fever, weight loss, weight gain, vision loss, decreased hearing, hoarseness, chest pain, syncope, peripheral edema, prolonged cough, headaches, hemoptysis, melena, hematochezia, severe indigestion/heartburn, hematuria, incontinence, muscle weakness, suspicious skin lesions, transient blindness, difficulty walking, depression, unusual weight change, abnormal bleeding, enlarged lymph nodes, and angioedema.     Objective:   Physical Exam     WD, WN, 83 y/o WF in NAD... GENERAL:  Alert & oriented; pleasant & cooperative... HEENT:  [place]/AT, EOM-wnl, PERRLA, EACs-clear, TMs-wnl, NOSE-clear, THROAT-clear & wnl. NECK:  Supple w/ fairROM; no JVD; normal carotid impulses w/o bruits; no thyromegaly or nodules palpated; no  lymphadenopathy. CHEST:  scat bilat rhonchi/ congestion without wheezing, rales or signs of consolidation... HEART:  Regular Rhythm; without murmurs/ rubs/ or gallops heard... ABDOMEN:  Soft w/ mild tender LLQ; normal bowel sounds; no organomegaly or masses detected. EXT: without deformities or arthritic changes; no varicose veins/ venous insuffic/ or edema. NEURO:  CN's intact;  no focal neuro deficits... DERM:  No lesions noted; no rash etc...  RADIOLOGY DATA:  Reviewed in the EPIC EMR & discussed w/ the patient...  LABORATORY DATA:  Reviewed in the EPIC EMR & discussed w/ the patient...    Assessment & Plan:    COPD/ Bronchiectasis/ Hx pneumonia>  On Advair250, Mucinex2Bid, Fluids & vigorous Chest PT/ postural drainage... 9/13> treating acute exac w/ Levaquin x10d; Cult grew a resist Pseudomonas but she reports clinically improved, therefore continue present Rx & follow...  Repeat Sput C&S 4/14 showed pansens Pseudomonas... Baseline CXRs showed CXR 3/15 & 3/16 showed scarring LUL, no change, and surg clips left breast & axilla... PFTs 9/15 showed FVC= 2.08 (77%), FEV1= 1.22 (61%), %1sec= 59%,  mid-flows= 31% predicted... c/w GOLD Stage2 COPD, pt GroupB... REMINDED TO TAKE MEDS REGULARLY & MAINTAIN A VIGOROUS REGIMEN OF CHEST PT & POSTURAL DRAINAGE... 07/2015> she developed a bronchiectasis exac after R shoulder surg when she was no longer able to do her chest Pt & postural drain=> cult grew pseudomonas sens to cipro=> treated w/ this + home NEB using DUONEB Tid... 02/03/16>  It would be additionally beneficial for her to be on a NEB e/ med Tid=Qid before her chest PT/ postural drainage treatments but she is quite sure she cannot tolerate the neb rx;  Her orthopedic issues mount & every surgery/ every general anesthetic takes a toll;  Rec to continue Advair250Bid, Mucinex 1200Bid, fluids, and the chest Rx. 08/05/16>   Miranda Thompson is rec to stop the Mag supplement & add metamucil w/ sm amt water;   We reviewed her vigorous bronchiectasis regimen w/ Advair, GFN, fluids, chest PT & postural drain;  Continue other meds the same & call for any breathing issues.   HBP & palpit (PACs)>  followed by Cherly Hensen; on CardizemCD120 & she does not want BBlockers, & only wants "low-dose" meds... 10/16>   BP at home variable w/ 160/100+ episodes so we decided to add low dose LOSARTAN 50m/d... 1/17>   BP much better w/ the Losar50 added- continue same...   CHOL>  On diet alone & f/u FLP is at goal...  Bre[facility] Cancer>  [facility] by DrNeijstrom, then [place] 2003 & released; yearly mammograms & monthly self exams were wnl...  Divertics>  She had acute diverticulitis 3/12 & 10/12> resolved w/ diet adjust & Cipro/Flagyl, now back to baseline & advised re- Metamucil, Fiberall, etc; she saw DrPatterson 11/12.  DJD/ LBP>  Ortho is DrOlin & DrFields; she takes Glucosamine & Herbs;  11/16>   s/p right TKR 12/2014 by DrOlin 6/17>   sched for right total shoulder replacement by DrNorris  Osteopenia/ Vit D defic>  On Calcium, MVI, Vit D supplement; followed by GYN= DrRomaine...  Other medical issues as noted...    Patient's Medications  New Prescriptions   No medications on file  Previous Medications   CALCIUM CARB-CHOLECALCIFEROL 600-800 MG-UNIT TABS    Take 1 tablet by mouth daily.   CELECOXIB (CELEBREX) 200 MG CAPSULE    Take 1 capsule (200 mg total) by mouth every 12 (twelve) hours.   CHOLECALCIFEROL (VITAMIN D) 1000 UNITS TABLET    Take 1,000 Units by mouth daily.   DILTIAZEM (CARDIZEM CD) 120 MG 24 HR CAPSULE    Take 1 capsule (120 mg total) by mouth daily.   ESTRADIOL (YUVAFEM) 10 MCG TABS VAGINAL TABLET    Take 1 tablet (10 mcg total) by mouth 2 (two) times a week.   FLUTICASONE-SALMETEROL (ADVAIR) 250-50 MCG/DOSE AEPB    Inhale 1 puff into the lungs daily.   GUAIFENESIN (MUCINEX) 600 MG 12 HR TABLET    Take 1,200 mg by mouth 2 (two) times daily.   LOSARTAN (COZAAR)  50 MG TABLET    TAKE 1 TABLET EVERY DAY   MAGNESIUM 500 MG CAPS    Take 1,000 mg by mouth at bedtime.   MULTIPLE VITAMINS-MINERALS (MULTIVITAMIN & MINERAL PO)    Take 1 tablet by mouth daily.     PROBIOTIC PRODUCT (ALIGN PO)    Take 1 tablet by mouth daily.   Modified Medications   No medications on file  Discontinued Medications   FERROUS SULFATE 325 (65 FE) MG TABLET  Take 1 tablet (325 mg total) by mouth 3 (three) times daily after meals.   GUAIFENESIN (MUCINEX MAXIMUM STRENGTH) 1200 MG TB12    Take 1,200 mg by mouth daily.

## 2016-08-05 NOTE — Patient Instructions (Signed)
Today we updated your med list in our EPIC system...    Continue your current medications the same...  We discussed possibly separating out your BP meds-- one in AM & one in PM...  This can also serve as a reminder to do your Advair-- one inhalation in AM & one in PM (before dinner)    followed by your exercise regimen & your postural drainage as we discussed...  For the loose stool-- get back on the Metamucil & increase your fiber intake...  Today we did some follow up blood work...    We will contact you w/ the results when available...   Call for any questions...  Let's plan a follow up visit in 19mo, sooner if needed for problems.Marland KitchenMarland Kitchen

## 2016-08-06 NOTE — Progress Notes (Signed)
lmtcb

## 2016-09-03 ENCOUNTER — Encounter: Payer: Self-pay | Admitting: Pulmonary Disease

## 2016-09-03 MED ORDER — FLUTICASONE-SALMETEROL 250-50 MCG/DOSE IN AEPB: 1.0000 | INHALATION_SPRAY | Freq: Every day | RESPIRATORY_TRACT | 5 refills | Status: DC

## 2016-09-08 ENCOUNTER — Other Ambulatory Visit: Payer: Self-pay | Admitting: Pulmonary Disease

## 2016-09-23 ENCOUNTER — Encounter: Payer: Self-pay | Admitting: Pulmonary Disease

## 2016-09-23 NOTE — Telephone Encounter (Signed)
Spoke with Leigh regarding this and she states go ahead and tell her to call GI for eval  She is est already  Email sent to her

## 2016-10-11 ENCOUNTER — Emergency Department (HOSPITAL_COMMUNITY): Payer: Medicare Other

## 2016-10-11 ENCOUNTER — Emergency Department (HOSPITAL_COMMUNITY)
Admission: EM | Admit: 2016-10-11 | Discharge: 2016-10-11 | Disposition: A | Discharge status: Home / Self Care | Payer: Medicare Other | Attending: Emergency Medicine | Admitting: Emergency Medicine

## 2016-10-11 ENCOUNTER — Encounter (HOSPITAL_COMMUNITY): Payer: Self-pay | Admitting: *Deleted

## 2016-10-11 DIAGNOSIS — R079 Chest pain, unspecified: Secondary | ICD-10-CM | POA: Insufficient documentation

## 2016-10-11 DIAGNOSIS — Z79899 Other long term (current) drug therapy: Secondary | ICD-10-CM | POA: Diagnosis not present

## 2016-10-11 DIAGNOSIS — J449 Chronic obstructive pulmonary disease, unspecified: Secondary | ICD-10-CM | POA: Diagnosis not present

## 2016-10-11 DIAGNOSIS — Z87891 Personal history of nicotine dependence: Secondary | ICD-10-CM | POA: Insufficient documentation

## 2016-10-11 DIAGNOSIS — R1010 Upper abdominal pain, unspecified: Secondary | ICD-10-CM | POA: Diagnosis not present

## 2016-10-11 DIAGNOSIS — Z96653 Presence of artificial knee joint, bilateral: Secondary | ICD-10-CM | POA: Diagnosis not present

## 2016-10-11 DIAGNOSIS — Z853 Personal history of malignant neoplasm of breast: Secondary | ICD-10-CM | POA: Diagnosis not present

## 2016-10-11 LAB — BASIC METABOLIC PANEL
Anion gap: 9 (ref 5–15)
BUN: 9 mg/dL (ref 6–20)
CO2: 26 mmol/L (ref 22–32)
Calcium: 9.1 mg/dL (ref 8.9–10.3)
Chloride: 95 mmol/L — ABNORMAL LOW (ref 101–111)
Creatinine, Ser: 0.57 mg/dL (ref 0.44–1.00)
GFR calc Af Amer: >60 mL/min (ref >60)
GFR calc non Af Amer: >60 mL/min (ref >60)
Glucose, Bld: 141 mg/dL — ABNORMAL HIGH (ref 65–99)
Potassium: 3.8 mmol/L (ref 3.5–5.1)
Sodium: 130 mmol/L — ABNORMAL LOW (ref 135–145)

## 2016-10-11 LAB — CBC
HCT: 37.7 % (ref 36.0–46.0)
Hemoglobin: 12.9 g/dL (ref 12.0–15.0)
MCH: 32.3 pg (ref 26.0–34.0)
MCHC: 34.2 g/dL (ref 30.0–36.0)
MCV: 94.3 fL (ref 78.0–100.0)
Platelets: 245 10*3/uL (ref 150–400)
RBC: 4 MIL/uL (ref 3.87–5.11)
RDW: 11.9 % (ref 11.5–15.5)
WBC: 5.5 10*3/uL (ref 4.0–10.5)

## 2016-10-11 LAB — HEPATIC FUNCTION PANEL
ALT: 14 U/L (ref 14–54)
AST: 25 U/L (ref 15–41)
Albumin: 3.9 g/dL (ref 3.5–5.0)
Alkaline Phosphatase: 64 U/L (ref 38–126)
Bilirubin, Direct: <0.1 mg/dL — ABNORMAL LOW (ref 0.1–0.5)
Total Bilirubin: 1.3 mg/dL — ABNORMAL HIGH (ref 0.3–1.2)
Total Protein: 7.7 g/dL (ref 6.5–8.1)

## 2016-10-11 LAB — LIPASE, BLOOD: Lipase: 24 U/L (ref 11–51)

## 2016-10-11 LAB — I-STAT TROPONIN, ED: Troponin i, poc: 0 ng/mL (ref 0.00–0.08)

## 2016-10-11 NOTE — Discharge Instructions (Signed)
This episode may have been related to your stomach or esophagus. If you experience recurrence of your symptoms or any new or worsening symptoms please recheck with [place].

## 2016-10-11 NOTE — ED Triage Notes (Signed)
Pt is here for CP.  Pt reports feeling "a fist pressing against" her epigastric area.  This began while having dinner last night and made it impossible to eat anymore as it was too uncomfortable to swallow.  Pt states that she continues to feel a bit of pressure in the epigastric area and rates it a 2/10.  Pt denies any sob, diaphoresis or nausea with this yesterday.

## 2016-10-11 NOTE — ED Provider Notes (Signed)
[facility] DEPT Provider Note   CSN: [id number] Arrival date & time: 10/11/16  1122     History   Chief Complaint Chief Complaint  Patient presents with  . Chest Pain    HPI Miranda Thompson is a 83 y.o. female.Chief complaint is chest and abdominal pain  HPI this is an 83 year old female. She was eating dinner last night. Grilled tuna rice and vegetables. States she had a sudden feeling of a "spasm" in her lower chest and upper abdomen. She states it felt like "a fist pushing in on me. She denies that this felt like a squeeze or pressure from with. She states that she did not eat anymore because she felt as though it would make it worse. Her symptoms resolved within 15 or 20 minutes. She states she feels "just sore" today. No history of heart disease. She has a history of COPD and bronchiectasis. She exercises on a stationary bike 50 minutes per day 6 days per week without limiting dyspnea or chest pain and did so yesterday. She has eaten today without symptoms.  Past Medical History:  Diagnosis Date  . Allergic rhinitis   . Anemia   . Atrial fibrillation ([place])    goes in and out of this rhythm- takes Tenormin  . Breast cancer, left breast ([place]) 1994 with lumpectomy   chemo,tamox  . Bronchiectasis with acute exacerbation ([place])   . COPD (chronic obstructive pulmonary disease) ([place])   . Diverticulosis of colon   . DJD (degenerative joint disease)   . Dysrhythmia    palpitations  . Family history of malignant neoplasm of gastrointestinal tract   . Fibromyalgia   . Hemoptysis   . History of pneumonia   . Hypercholesteremia   . Hypertension   . Low back pain syndrome   . Osteopenia   . Pneumonia    hx  . Pseudomonas infection   . Rotator cuff syndrome of right shoulder   . Spinal stenosis     Patient Active Problem List   Diagnosis Date Noted  . S/P left TKA 03/23/2016  . Status post left knee replacement 03/23/2016  . S/P knee replacement 03/23/2016  . S/P  shoulder replacement 08/02/2015  . S/P right TKA 01/21/2015  . Palpitations 08/20/2014  . Episode of hypertension 08/20/2014  . COPD, moderate ([place]) 05/09/2014  . Premature atrial contractions 11/08/2013  . Cardiac arrhythmia 05/10/2013  . Trochanteric bursitis of left hip 02/02/2012  . Osteoarthritis of left hip 06/25/2011  . Leg length inequality 06/25/2011  . Other secondary osteoarthritis of both knees 06/25/2011  . Diverticular disease of colon 01/20/2011  . Left ovarian cyst 01/20/2011  . DIVERTICULITIS, ACUTE 05/05/2010  . Elevated lipids 11/04/2009  . ACUTE CYSTITIS 11/04/2009  . LOW BACK PAIN SYNDROME 05/07/2008  . BRONCHIECTASIS WITH ACUTE EXACERBATION 03/18/2007  . Hemoptysis 03/18/2007  . Malignant neoplasm of female breast ([place]) 03/07/2007  . PNEUMONIA 03/07/2007  . Diverticulosis of large intestine 03/07/2007  . ROTATOR CUFF SYNDROME, RIGHT 03/07/2007  . Disorder of bone and cartilage 03/07/2007  . ANEMIA 03/04/2007  . ALLERGIC RHINITIS 03/04/2007  . BRONCHIECTASIS 03/04/2007  . Osteoarthritis 03/04/2007    Past Surgical History:  Procedure Laterality Date  . COLONOSCOPY    . left breast lumpectomy and LN dissection  06/1992   Dr. [name]  . LUNG BIOPSY  1968   TSurg---Dr. [name]  . LUNG BIOPSY  2012   dr [name]  . REVERSE SHOULDER ARTHROPLASTY Right 08/02/2015   Procedure: RIGHT REVERSE TOTAL  SHOULDER ARTHROPLASTY;  Surgeon: [name], MD;  Location: [place];  Service: Orthopedics;  Laterality: Right;  . REVERSE TOTAL SHOULDER ARTHROPLASTY Right 08/02/2015  . TONSILLECTOMY    . TOTAL KNEE ARTHROPLASTY Right 01/21/2015   Procedure: TOTAL RIGHT KNEE ARTHROPLASTY;  Surgeon: [name], MD;  [facility];  Service: Orthopedics;  Laterality: Right;  . TOTAL KNEE ARTHROPLASTY Left 03/23/2016   Procedure: LEFT TOTAL KNEE ARTHROPLASTY;  Surgeon: [name], MD;  [facility];  Service: Orthopedics;  Laterality: Left;    OB History    Gravida Para Term  Preterm AB Living   _0 SAB TAB Ectopic Multiple Live Births           5       Home Medications    Prior to Admission medications   Medication Sig Start Date End Date Taking? Authorizing Provider  Calcium Carb-Cholecalciferol 600-800 MG-UNIT TABS Take 1 tablet by mouth daily.   Yes [provider]  cholecalciferol (VITAMIN D) 1000 UNITS tablet Take 1,000 Units by mouth daily.   Yes [provider]  diltiazem (CARDIZEM CD) 120 MG 24 hr capsule Take 1 capsule (120 mg total) by mouth daily. 07/06/16  Yes Noralee Space, MD  Estradiol (YUVAFEM) 10 MCG TABS vaginal tablet Take 1 tablet (10 mcg total) by mouth 2 (two) times a week. Patient taking differently: Place 1 tablet vaginally 2 (two) times a week.  05/28/16  Yes Regina Eck, CNM  Fluticasone-Salmeterol (ADVAIR) 250-50 MCG/DOSE AEPB Inhale 1 puff into the lungs daily. 09/03/16  Yes Noralee Space, MD  guaiFENesin (MUCINEX) 600 MG 12 hr tablet Take 600 mg by mouth daily.    Yes [provider]  losartan (COZAAR) 50 MG tablet TAKE 1 TABLET EVERY DAY Patient taking differently: Take 50 mg by mouth daily 07/06/16  Yes Noralee Space, MD  Multiple Vitamins-Minerals (MULTIVITAMIN & MINERAL PO) Take 1 tablet by mouth daily.     Yes [provider]  Probiotic Product (ALIGN PO) Take 1 tablet by mouth daily.    Yes [provider]  celecoxib (CELEBREX) 200 MG capsule Take 1 capsule (200 mg total) by mouth every 12 (twelve) hours. Patient not taking: Reported on 10/11/2016 03/23/16   Danae Orleans, PA-C    Family History Family History  Problem Relation Age of Onset  . Heart attack Mother        Deceased age 89  . Colon cancer Father        deceased age 67 from colon ca.  . Hypertension Brother   . Esophageal cancer Neg Hx   . Stomach cancer Neg Hx   . Rectal cancer Neg Hx     Social History Social History  Substance Use Topics  . Smoking status: Former Smoker    Packs/day:  0.25    Years: 40.00    Types: Cigarettes    Quit date: 02/24/1967  . Smokeless tobacco: Never Used  . Alcohol use 0.6 oz/week    1 Standard drinks or equivalent per week     Comment: occasionally     Allergies   Patient has no known allergies.   Review of Systems Review of Systems  Constitutional: Negative for appetite change, chills, diaphoresis, fatigue and fever.  HENT: Negative for mouth sores, sore throat and trouble swallowing.   Eyes: Negative for visual disturbance.  Respiratory: Negative for cough, chest tightness, shortness of breath and wheezing.  Cardiovascular: Positive for chest pain.  Gastrointestinal: Positive for abdominal distention. Negative for abdominal pain, diarrhea, nausea and vomiting.  Endocrine: Negative for polydipsia, polyphagia and polyuria.  Genitourinary: Negative for dysuria, frequency and hematuria.  Musculoskeletal: Negative for gait problem.  Skin: Negative for color change, pallor and rash.  Neurological: Negative for dizziness, syncope, light-headedness and headaches.  Hematological: Does not bruise/bleed easily.  Psychiatric/Behavioral: Negative for behavioral problems and confusion.     Physical Exam Updated Vital Signs BP (!) 146/61   Pulse 77   Temp 97.7 F (36.5 C) (Oral)   Resp 18   LMP 02/23/1985   SpO2 100%   Physical Exam  Constitutional: She is oriented to person, place, and time. She appears well-developed and well-nourished. No distress.  HENT:  Head: Normocephalic.  Eyes: Pupils are equal, round, and reactive to light. Conjunctivae are normal. No scleral icterus.  Neck: Normal range of motion. Neck supple. No thyromegaly present.  Cardiovascular: Normal rate and regular rhythm.  Exam reveals no gallop and no friction rub.   No murmur heard. Pulmonary/Chest: Effort normal and breath sounds normal. No respiratory distress. She has no wheezes. She has no rales.  Abdominal: Soft. Bowel sounds are normal. She exhibits  no distension. There is no tenderness. There is no rebound.  Musculoskeletal: Normal range of motion.  Neurological: She is alert and oriented to person, place, and time.  Skin: Skin is warm and dry. No rash noted.  Psychiatric: She has a normal mood and affect. Her behavior is normal.     ED Treatments / Results  Labs (all labs ordered are listed, but only abnormal results are displayed) Labs Reviewed  BASIC METABOLIC PANEL - Abnormal; Notable for the following:       Result Value   Sodium 130 (*)    Chloride 95 (*)    Glucose, Bld 141 (*)    All other components within normal limits  HEPATIC FUNCTION PANEL - Abnormal; Notable for the following:    Total Bilirubin 1.3 (*)    Bilirubin, Direct <0.1 (*)    All other components within normal limits  CBC  LIPASE, BLOOD  I-STAT TROPONIN, ED  I-STAT TROPONIN, ED    EKG  EKG Interpretation  Date/Time:  Sunday October 11 2016 11:30:43 EDT Ventricular Rate:  92 PR Interval:  166 QRS Duration: 66 QT Interval:  344 QTC Calculation: 425 R Axis:   -52 Text Interpretation:  Normal sinus rhythm TWI and Slow R progression anteriorly--withour change No significant change vs comparison 03/18/2016 Confirmed by Tanna Furry [phone]) on 10/11/2016 12:27:04 PM       Radiology Dg Chest 2 View  Result Date: 10/11/2016 CLINICAL DATA:  83 year old female with a history of epigastric pain EXAM: CHEST  2 VIEW COMPARISON:  01/28/2016, 08/01/2015 FINDINGS: Cardiomediastinal silhouette unchanged in size and contour. Calcifications of the aortic arch. Similar fibrotic changes of the lungs with architectural distortion and reticular opacities bilaterally. Predominantly the changes are left-sided in the mid mid lung and hilar region. New nodule of the right mid lung. No pneumothorax. No pleural effusion. No new confluent airspace disease. Partial collapse of the lingula and middle lobe. Surgical changes of left chest wall and axilla. Partially imaged right  shoulder arthroplasty. IMPRESSION: Re- demonstration of chronic lung changes, predominantly affecting lingula and middle lobe, possibly related to chronic infection/ MAC. No evidence of new pneumonia or pleural effusion. New nodule of the right mid lung, in a region of prior scarring. Although these are potentially  chronic changes given the appearance of the remainder of the lungs, referral for pulmonary evaluation and risk factor assessment may be useful for consideration of chest CT imaging. Atherosclerosis. Electronically Signed   By: Corrie Mckusick D.O.   On: 10/11/2016 12:22    Procedures Procedures (including critical care time)  Medications Ordered in ED Medications - No data to display   Initial Impression / Assessment and Plan / ED Course  I have reviewed the triage vital signs and the nursing notes.  Pertinent labs & imaging results that were available during my care of the patient were reviewed by me and considered in my medical decision making (see chart for details).   after 18 hours symptoms has normal troponin and normal EKG. Normal hepatic and pancreatic enzymes. I'm reassured by her low cardiac score despite her age This is an acceptable patient for primary care and outpatient follow-up. If any recurrence of symptoms of asked her to recheck here.  Final Clinical Impressions(s) / ED Diagnoses   Final diagnoses:  Chest pain, unspecified type    New Prescriptions New Prescriptions   No medications on file     Tanna Furry, MD 10/11/16 1414

## 2016-10-13 ENCOUNTER — Telehealth: Payer: Self-pay | Admitting: Pulmonary Disease

## 2016-10-13 ENCOUNTER — Encounter: Payer: Self-pay | Admitting: Pulmonary Disease

## 2016-10-13 NOTE — Telephone Encounter (Signed)
mychart message sent to patient.  Will close encounter.

## 2016-10-13 NOTE — Telephone Encounter (Signed)
Received the following email from Mrs. Miranda Thompson,   Hi,    I do not have an emergency, but did want Dr. [name] to know that I went to the ER Sunday 8/19 due to an incident I experienced Saturday at dinner. Half way through the meal I had a sudden and quite notable feeling in the middle of my chest that felt as if I had swallowed a large grapefruit that got lodged in my chest, dinner consisted of a tuna steak, rice and broccoli casserole preceded by a vodka tonic. Family from out of town was visiting and we just determined it was reflux, although I had never experienced reflux before, in any case, the discomfort lessened considerably, had a good night's sleep. Evidently, my husband and daughter did not sleep well and insisted that I go to the ER.     Short story long, no heart attack but Dr. Jeneen Rinks recommended that I contact Dr. [name], he said all tests were good; perhaps, he just wants the x-ray evaluated by my doctor.    Thanks so much,     Miranda Thompson    Dr. [name], please advise. Thanks!

## 2016-10-13 NOTE — Telephone Encounter (Signed)
Per SN---  He has reviewed her ER notes, xrays, labs, ekg's and it looks like the sudden symptoms while eating sounds like esophageal stricture or spasms and SN recs that she follow up with Dr. Hilarie Fredrickson for further eval.  Thanks

## 2016-10-14 ENCOUNTER — Encounter: Payer: Self-pay | Admitting: Pulmonary Disease

## 2016-10-14 NOTE — Telephone Encounter (Signed)
This question has already been routed to SN per the 8.21.18 phone note

## 2016-10-14 NOTE — Telephone Encounter (Signed)
Pt would like to know if SN has an concerns about the changes in her most recent CXR.  SN - please advise. Thanks.

## 2016-10-15 ENCOUNTER — Encounter: Payer: Self-pay | Admitting: *Deleted

## 2016-10-15 ENCOUNTER — Ambulatory Visit (INDEPENDENT_AMBULATORY_CARE_PROVIDER_SITE_OTHER): Payer: Medicare Other | Admitting: Internal Medicine

## 2016-10-15 VITALS — BP 128/62 | HR 86 | Ht 63.39 in | Wt 133.8 lb

## 2016-10-15 DIAGNOSIS — Z860100 Personal history of colon polyps, unspecified: Secondary | ICD-10-CM

## 2016-10-15 DIAGNOSIS — R197 Diarrhea, unspecified: Secondary | ICD-10-CM | POA: Diagnosis not present

## 2016-10-15 DIAGNOSIS — R0789 Other chest pain: Secondary | ICD-10-CM | POA: Diagnosis not present

## 2016-10-15 NOTE — Patient Instructions (Signed)
Your physician has requested that you go to the basement for the following lab work before leaving today: GI Pathogen panel, fecal leukocytes, fecal elastace  You have been scheduled for a Barium Esophogram with upper GI at [facility] (1st floor of the hospital) on 10/21/16 Wednesday at 9:30 am. Please arrive 15 minutes prior to your appointment for registration. Make certain not to have anything to eat or drink 4 hours prior to your test. If you need to reschedule for any reason, please contact radiology at [phone] to do so. __________________________________________________________________ A barium swallow is an examination that concentrates on views of the esophagus. This tends to be a double contrast exam (barium and two liquids which, when combined, create a gas to distend the wall of the oesophagus) or single contrast (non-ionic iodine based). The study is usually tailored to your symptoms so a good history is essential. Attention is paid during the study to the form, structure and configuration of the esophagus, looking for functional disorders (such as aspiration, dysphagia, achalasia, motility and reflux) EXAMINATION You may be asked to change into a gown, depending on the type of swallow being performed. A radiologist and radiographer will perform the procedure. The radiologist will advise you of the type of contrast selected for your procedure and direct you during the exam. You will be asked to stand, sit or lie in several different positions and to hold a small amount of fluid in your mouth before being asked to swallow while the imaging is performed .In some instances you may be asked to swallow barium coated marshmallows to assess the motility of a solid food bolus. The exam can be recorded as a digital or video fluoroscopy procedure. POST PROCEDURE It will take 1-2 days for the barium to pass through your system. To facilitate this, it is important, unless otherwise directed,  to increase your fluids for the next 24-48hrs and to resume your normal diet.  This test typically takes about 30 minutes to perform. ___________________________________________________________________  If you are age 62 or older, your body mass index should be between 23-30. Your Body mass index is 23.41 kg/m. If this is out of the aforementioned range listed, please consider follow up with your Primary Care Provider.  If you are age 22 or younger, your body mass index should be between 19-25. Your Body mass index is 23.41 kg/m. If this is out of the aformentioned range listed, please consider follow up with your Primary Care Provider.

## 2016-10-15 NOTE — Progress Notes (Signed)
Patient ID: Miranda Thompson, female   DOB: 10-13-1933, 83 y.o.   MRN: [id number] HPI: [name] is an 83 year old female with a past medical history of adenomatous colon polyps, atrial fibrillation, COPD and bronchiectasis, hypertension, hypercholesterolemia, remote breast cancer who is seen in consult at the request of Dr. [name] to evaluate atypical chest pain. She is here today with her husband.  She has not been seen in the office in approximately 5 years but had her surveillance colonoscopy on 08/08/2014. Colonoscopy revealed 2 small polyps, 3-5 mm in size, removed from the ascending colon and hepatic flexure. There was moderate diverticulosis of the ascending colon and left colon. These polyps were tubular adenomas without high-grade dysplasia.  She reports that several days ago during dinner she developed a fullness and pressure between her breasts. This reportedly felt like she "swallowed a grapefruit". This lasted 20 minutes before it eased off completely. It has not recurred. This felt like a pressure to her and was also near the xiphoid. There was no further radiation. No associated nausea or vomiting. She stopped eating and did not eat any more or drinking more during that period. She has never felt that before and denies a history of heartburn. There is no associated dyspnea. He reports being very active and exercises 5-6 days per week and has had no exertional chest pain. She was seen in the ER the next day on advice from her family to rule out a cardiac event. This evaluation was unremarkable.  On a different note she's had a few minutes of loose watery stools. This can skip a day but most days she is having 3-4 loose and watery stools. Stools are nonbloody and non-melenic. There's been no abdominal pain. Symptoms are not nocturnal and can occur throughout the day not necessarily precipitated by eating. She's tried to stop coffee and also eliminated some fresh fruits without much benefit.  She's not had recent antibiotics. No one else close to her has been sick with diarrheal illness to her knowledge.  Past Medical History:  Diagnosis Date  . Adenomatous colon polyp   . Allergic rhinitis   . Anemia   . Atrial fibrillation ([place])    goes in and out of this rhythm- takes Tenormin  . Breast cancer, left breast ([place]) 1994 with lumpectomy   chemo,tamox  . Bronchiectasis with acute exacerbation ([place])   . COPD (chronic obstructive pulmonary disease) ([place])   . Diverticulosis of colon   . DJD (degenerative joint disease)   . Dysrhythmia    palpitations  . Family history of malignant neoplasm of gastrointestinal tract   . Fibromyalgia   . Hemoptysis   . History of pneumonia   . Hypercholesteremia   . Hypertension   . Low back pain syndrome   . Osteopenia   . Pneumonia    hx  . Pseudomonas infection   . Rotator cuff syndrome of right shoulder   . Spinal stenosis     Past Surgical History:  Procedure Laterality Date  . COLONOSCOPY    . left breast lumpectomy and LN dissection  06/1992   Dr. [name]  . LUNG BIOPSY  1968   TSurg---Dr. [name]  . LUNG BIOPSY  2012   dr [name]  . REVERSE SHOULDER ARTHROPLASTY Right 08/02/2015   Procedure: RIGHT REVERSE TOTAL SHOULDER ARTHROPLASTY;  Surgeon: [name], MD;  Location: [facility];  Service: Orthopedics;  Laterality: Right;  . REVERSE TOTAL SHOULDER ARTHROPLASTY Right 08/02/2015  . TONSILLECTOMY    .  TOTAL KNEE ARTHROPLASTY Right 01/21/2015   Procedure: TOTAL RIGHT KNEE ARTHROPLASTY;  Surgeon: [name], MD;  Location: [facility];  Service: Orthopedics;  Laterality: Right;  . TOTAL KNEE ARTHROPLASTY Left 03/23/2016   Procedure: LEFT TOTAL KNEE ARTHROPLASTY;  Surgeon: [name], MD;  Location: [facility];  Service: Orthopedics;  Laterality: Left;    Outpatient Medications Prior to Visit  Medication Sig Dispense Refill  . Calcium Carb-Cholecalciferol 600-800 MG-UNIT TABS Take 1 tablet by mouth daily.    . cholecalciferol (VITAMIN  D) 1000 UNITS tablet Take 1,000 Units by mouth daily.    Marland Kitchen diltiazem (CARDIZEM CD) 120 MG 24 hr capsule Take 1 capsule (120 mg total) by mouth daily. 90 capsule 3  . Fluticasone-Salmeterol (ADVAIR) 250-50 MCG/DOSE AEPB Inhale 1 puff into the lungs daily. 60 each 5  . guaiFENesin (MUCINEX) 600 MG 12 hr tablet Take 600 mg by mouth daily.     Marland Kitchen losartan (COZAAR) 50 MG tablet TAKE 1 TABLET EVERY DAY (Patient taking differently: Take 50 mg by mouth daily) 30 tablet 5  . Multiple Vitamins-Minerals (MULTIVITAMIN & MINERAL PO) Take 1 tablet by mouth daily.      . Probiotic Product (ALIGN PO) Take 1 tablet by mouth daily.     . celecoxib (CELEBREX) 200 MG capsule Take 1 capsule (200 mg total) by mouth every 12 (twelve) hours. (Patient not taking: Reported on 10/11/2016) 60 capsule 0  . Estradiol (YUVAFEM) 10 MCG TABS vaginal tablet Take 1 tablet (10 mcg total) by mouth 2 (two) times a week. (Patient taking differently: Place 1 tablet vaginally 2 (two) times a week. ) 8 tablet 12   No facility-administered medications prior to visit.     No Known Allergies  Family History  Problem Relation Age of Onset  . Heart attack Mother        Deceased age 64  . Colon cancer Father        deceased age 59 from colon ca.  . Hypertension Brother   . Esophageal cancer Neg Hx   . Stomach cancer Neg Hx   . Rectal cancer Neg Hx     Social History  Substance Use Topics  . Smoking status: Former Smoker    Packs/day: 0.25    Years: 40.00    Types: Cigarettes    Quit date: 02/24/1967  . Smokeless tobacco: Never Used  . Alcohol use 0.6 oz/week    1 Standard drinks or equivalent per week     Comment: occasionally    ROS: As per history of present illness, otherwise negative  BP 128/62   Pulse 86   Ht 5' 3.39" (1.61 m)   Wt 133 lb 12.8 oz (60.7 kg)   LMP 02/23/1985   BMI 23.41 kg/m  Constitutional: Well-developed and well-nourished. No distress. HEENT: Normocephalic and atraumatic. Conjunctivae are  normal.  No scleral icterus. Neck: Neck supple. Trachea midline. Cardiovascular: Normal rate, regular rhythm and intact distal pulses.  Pulmonary/chest: Effort normal and breath sounds normal. No wheezing, rales or rhonchi. Abdominal: Soft, nontender, nondistended. Bowel sounds active throughout. There are no masses palpable. Extremities: no clubbing, cyanosis, or edema Neurological: Alert and oriented to person place and time. Skin: Skin is warm and dry.  Psychiatric: Normal mood and affect. Behavior is normal.  RELEVANT LABS AND IMAGING: CBC    Component Value Date/Time   WBC 5.5 10/11/2016 1131   RBC 4.00 10/11/2016 1131   HGB 12.9 10/11/2016 1131   HCT 37.7 10/11/2016 1131   PLT  245 10/11/2016 1131   MCV 94.3 10/11/2016 1131   MCH 32.3 10/11/2016 1131   MCHC 34.2 10/11/2016 1131   RDW 11.9 10/11/2016 1131   LYMPHSABS 1.1 08/05/2016 1324   MONOABS 0.7 08/05/2016 1324   EOSABS 0.2 08/05/2016 1324   BASOSABS 0.1 08/05/2016 1324    CMP     Component Value Date/Time   NA 130 (L) 10/11/2016 1131   K 3.8 10/11/2016 1131   CL 95 (L) 10/11/2016 1131   CO2 26 10/11/2016 1131   GLUCOSE 141 (H) 10/11/2016 1131   BUN 9 10/11/2016 1131   CREATININE 0.57 10/11/2016 1131   CALCIUM 9.1 10/11/2016 1131   PROT 7.7 10/11/2016 1131   ALBUMIN 3.9 10/11/2016 1131   AST 25 10/11/2016 1131   ALT 14 10/11/2016 1131   ALKPHOS 64 10/11/2016 1131   BILITOT 1.3 (H) 10/11/2016 1131   GFRNONAA >60 10/11/2016 1131   GFRAA >60 10/11/2016 1131   Chest x-ray performed in the ER -- chronic lung changes. No evidence of new pneumonia or pleural effusion. Atherosclerosis.  Trop neg  Iron/TIBC/Ferritin/ %Sat    Component Value Date/Time   IRON 169 (H) 08/05/2016 1324   FERRITIN 63.8 08/05/2016 1324   IRONPCTSAT 46.4 08/05/2016 1324     ASSESSMENT/PLAN: 83 year old female with a past medical history of adenomatous colon polyps, atrial fibrillation, COPD and bronchiectasis, hypertension,  hypercholesterolemia, remote breast cancer who is seen in consult at the request of Dr. [name] to evaluate atypical chest pain.  1. Isolated episode of substernal chest pressure - nonexertional occurring while eating. Query reflux event or esophageal spasm. We discussed further evaluating this and will start with esophagram and upper GI series to rule out upper GI pathology. If abnormal she would need upper endoscopy. If recurrent or exertional in GI evaluation negative she needs cardiology consultation.  2. Diarrhea -- over the last several months. Check fecal leukocytes, GI pathogen panel and fecal elastase  3. History of colon polyps -- 2 small adenomas 2016, recall would be 2021 that may be deferred based on age. Can discuss more at that time      [id number], Deborra Medina, [place] [address] [place], [place] [address]

## 2016-10-16 ENCOUNTER — Other Ambulatory Visit: Payer: Medicare Other

## 2016-10-16 DIAGNOSIS — R197 Diarrhea, unspecified: Secondary | ICD-10-CM

## 2016-10-19 LAB — GASTROINTESTINAL PATHOGEN PANEL PCR
C. difficile Tox A/B, PCR: NOT DETECTED
Campylobacter, PCR: NOT DETECTED
Cryptosporidium, PCR: NOT DETECTED
E coli (ETEC) LT/ST PCR: NOT DETECTED
E coli (STEC) stx1/stx2, PCR: NOT DETECTED
E coli 0157, PCR: NOT DETECTED
Giardia lamblia, PCR: NOT DETECTED
Norovirus, PCR: NOT DETECTED
Rotavirus A, PCR: NOT DETECTED
Salmonella, PCR: NOT DETECTED
Shigella, PCR: NOT DETECTED

## 2016-10-20 LAB — FECAL LACTOFERRIN, QUANT: Lactoferrin: NEGATIVE

## 2016-10-21 ENCOUNTER — Encounter (HOSPITAL_COMMUNITY): Payer: Self-pay

## 2016-10-21 ENCOUNTER — Ambulatory Visit (HOSPITAL_COMMUNITY)
Admission: RE | Admit: 2016-10-21 | Discharge: 2016-10-21 | Disposition: A | Discharge status: Home / Self Care | Payer: Medicare Other | Source: Ambulatory Visit | Attending: Internal Medicine | Admitting: Internal Medicine

## 2016-10-21 ENCOUNTER — Encounter (HOSPITAL_COMMUNITY)
Admission: RE | Admit: 2016-10-21 | Discharge: 2016-10-21 | Disposition: A | Discharge status: Home / Self Care | Payer: Medicare Other | Source: Ambulatory Visit | Attending: Internal Medicine | Admitting: Internal Medicine

## 2016-10-21 DIAGNOSIS — K224 Dyskinesia of esophagus: Secondary | ICD-10-CM | POA: Insufficient documentation

## 2016-10-21 DIAGNOSIS — R0789 Other chest pain: Secondary | ICD-10-CM | POA: Insufficient documentation

## 2016-10-23 ENCOUNTER — Other Ambulatory Visit: Payer: Self-pay

## 2016-10-23 DIAGNOSIS — R1310 Dysphagia, unspecified: Secondary | ICD-10-CM

## 2016-10-23 LAB — PANCREATIC ELASTASE, FECAL: Pancreatic Elastase-1, Stool: 309 mcg/g

## 2016-10-27 ENCOUNTER — Encounter: Payer: Self-pay | Admitting: Internal Medicine

## 2016-10-27 ENCOUNTER — Ambulatory Visit (AMBULATORY_SURGERY_CENTER): Payer: Self-pay | Admitting: *Deleted

## 2016-10-27 ENCOUNTER — Other Ambulatory Visit: Payer: Self-pay

## 2016-10-27 VITALS — Ht 63.25 in | Wt 136.0 lb

## 2016-10-27 DIAGNOSIS — R0789 Other chest pain: Secondary | ICD-10-CM

## 2016-10-27 DIAGNOSIS — R1310 Dysphagia, unspecified: Secondary | ICD-10-CM

## 2016-10-27 NOTE — Progress Notes (Signed)
No egg or soy allergy known to patient  No issues with past sedation with any surgeries  or procedures, no intubation problems  No diet pills per patient No home 02 use per patient  No blood thinners per patient  Pt denies issues with constipation  No A fib or A flutter  EMMI video sent to pt's e mail  Husband in [place] with pt today

## 2016-10-29 ENCOUNTER — Ambulatory Visit (AMBULATORY_SURGERY_CENTER): Payer: Medicare Other | Admitting: Internal Medicine

## 2016-10-29 ENCOUNTER — Encounter: Payer: Self-pay | Admitting: Internal Medicine

## 2016-10-29 VITALS — BP 104/50 | HR 80 | Temp 97.5°F | Resp 18 | Ht 63.0 in | Wt 136.0 lb

## 2016-10-29 DIAGNOSIS — K209 Esophagitis, unspecified without bleeding: Secondary | ICD-10-CM

## 2016-10-29 DIAGNOSIS — R0789 Other chest pain: Secondary | ICD-10-CM

## 2016-10-29 DIAGNOSIS — R933 Abnormal findings on diagnostic imaging of other parts of digestive tract: Secondary | ICD-10-CM

## 2016-10-29 MED ORDER — SODIUM CHLORIDE 0.9 % IV SOLN: 500.0000 mL | INTRAVENOUS | Status: DC

## 2016-10-29 MED ORDER — PANTOPRAZOLE SODIUM 40 MG PO TBEC: 40.0000 mg | DELAYED_RELEASE_TABLET | Freq: Two times a day (BID) | ORAL | 5 refills | Status: DC

## 2016-10-29 MED ORDER — PANTOPRAZOLE SODIUM 20 MG PO TBEC: 20.0000 mg | DELAYED_RELEASE_TABLET | Freq: Every day | ORAL | 5 refills | Status: DC

## 2016-10-29 NOTE — Op Note (Signed)
[facility] Patient Name: Miranda Thompson Procedure Date: 10/29/2016 1:10 PM MRN: [id number] Endoscopist: Jerene Bears , MD Age: 83 Referring MD:  Date of Birth: 1933/11/22 Gender: Female Account #: [id number] Procedure:                Upper GI endoscopy Indications:              Abnormal cine-esophagram, Unexplained chest pain Medicines:                Monitored Anesthesia Care Procedure:                Pre-Anesthesia Assessment:                           - Prior to the procedure, a History and Physical                            was performed, and patient medications and                            allergies were reviewed. The patient's tolerance of                            previous anesthesia was also reviewed. The risks                            and benefits of the procedure and the sedation                            options and risks were discussed with the patient.                            All questions were answered, and informed consent                            was obtained. Prior Anticoagulants: The patient has                            taken no previous anticoagulant or antiplatelet                            agents. ASA Grade Assessment: III - A patient with                            severe systemic disease. After reviewing the risks                            and benefits, the patient was deemed in                            satisfactory condition to undergo the procedure.                           After obtaining informed consent, the endoscope was  passed under direct vision. Throughout the                            procedure, the patient's blood pressure, pulse, and                            oxygen saturations were monitored continuously. The                            Endoscope was introduced through the mouth, and                            advanced to the second part of duodenum. The upper                            GI  endoscopy was accomplished without difficulty.                            The patient tolerated the procedure well. Scope In: Scope Out: Findings:                 LA Grade A (one or more mucosal breaks less than 5                            mm, not extending between tops of 2 mucosal folds)                            esophagitis with no bleeding was found at the                            gastroesophageal junction.                           A 4 cm hiatal hernia was present (36 cm to 40 cm                            from the incisors).                           The exam of the esophagus was otherwise normal.                           The entire examined stomach was normal.                           The examined duodenum was normal. Complications:            No immediate complications. Estimated Blood Loss:     Estimated blood loss: none. Impression:               - Mild acid reflux esophagitis.                           - 4 cm hiatal hernia.                           -  Normal stomach.                           - Normal examined duodenum.                           - No specimens collected. Recommendation:           - Patient has a contact number available for                            emergencies. The signs and symptoms of potential                            delayed complications were discussed with the                            patient. Return to normal activities tomorrow.                            Written discharge instructions were provided to the                            patient.                           - Resume previous diet.                           - Continue present medications.                           - Trial of pantoprazole 20 mg once daily (30 min                            before breakfast) for acid reflux with esophagitis.                           - Speech and swallow therapy consultation for cough                            associated with eating and concern  for aspiration                            seen on UGI series. Jerene Bears, MD 10/29/2016 1:51:32 PM This report has been signed electronically.

## 2016-10-29 NOTE — Patient Instructions (Addendum)
YOU HAD AN ENDOSCOPIC PROCEDURE TODAY AT [facility]:   Refer to the procedure report that was given to you for any specific questions about what was found during the examination.  If the procedure report does not answer your questions, please call your gastroenterologist to clarify.  If you requested that your care partner not be given the details of your procedure findings, then the procedure report has been included in a sealed envelope for you to review at your convenience later.  YOU SHOULD EXPECT: Some feelings of bloating in the abdomen. Passage of more gas than usual.  Walking can help get rid of the air that was put into your GI tract during the procedure and reduce the bloating. If you had a lower endoscopy (such as a colonoscopy or flexible sigmoidoscopy) you may notice spotting of blood in your stool or on the toilet paper. If you underwent a bowel prep for your procedure, you may not have a normal bowel movement for a few days.  Please Note:  You might notice some irritation and congestion in your nose or some drainage.  This is from the oxygen used during your procedure.  There is no need for concern and it should clear up in a day or so.  SYMPTOMS TO REPORT IMMEDIATELY:    Following upper endoscopy (EGD)  Vomiting of blood or coffee ground material  New chest pain or pain under the shoulder blades  Painful or persistently difficult swallowing  New shortness of breath  Fever of 100F or higher  Black, tarry-looking stools  For urgent or emergent issues, a gastroenterologist can be reached at any hour by calling [phone].   DIET:  We do recommend a small meal at first, but then you may proceed to your regular diet.  Drink plenty of fluids but you should avoid alcoholic beverages for 24 hours.  ACTIVITY:  You should plan to take it easy for the rest of today and you should NOT DRIVE or use heavy machinery until tomorrow (because of the sedation medicines used  during the test).    FOLLOW UP: Our staff will call the number listed on your records the next business day following your procedure to check on you and address any questions or concerns that you may have regarding the information given to you following your procedure. If we do not reach you, we will leave a message.  However, if you are feeling well and you are not experiencing any problems, there is no need to return our call.  We will assume that you have returned to your regular daily activities without incident.  If any biopsies were taken you will be contacted by phone or by letter within the next 1-3 weeks.  Please call us at [phone] if you have not heard about the biopsies in 3 weeks.    SIGNATURES/CONFIDENTIALITY: You and/or your care partner have signed paperwork which will be entered into your electronic medical record.  These signatures attest to the fact that that the information above on your After Visit Summary has been reviewed and is understood.  Full responsibility of the confidentiality of this discharge information lies with you and/or your care-partner.  Hiatal hernia information given.  Trial of pantoprazole 20mg  once daily 30 minutes before breakfast for esophagitis,  Speech and swallow therapy suggested.

## 2016-10-30 ENCOUNTER — Telehealth: Payer: Self-pay

## 2016-10-30 NOTE — Telephone Encounter (Signed)
  Follow up Call-  Call back number 10/29/2016 08/08/2014  Post procedure Call Back phone  # [phone]  Permission to leave phone message Yes Yes  Some recent data might be hidden     Patient questions:  Do you have a fever, pain , or abdominal swelling? No. Pain Score  0 *  Have you tolerated food without any problems? Yes.    Have you been able to return to your normal activities? Yes.    Do you have any questions about your discharge instructions: Diet   No. Medications  No. Follow up visit  No.  Do you have questions or concerns about your Care? No.  Actions: * If pain score is 4 or above: No action needed, pain <4.

## 2016-11-02 ENCOUNTER — Encounter: Payer: Self-pay | Admitting: Internal Medicine

## 2016-11-03 ENCOUNTER — Telehealth: Payer: Self-pay

## 2016-11-03 NOTE — Telephone Encounter (Signed)
Spoke with pt and she is aware.

## 2016-11-03 NOTE — Telephone Encounter (Signed)
Pt scheduled for modified barium swallow at [facility] 11/09/16@1pm , pt to check in on the 1st floor of radiology dept at 12:45pm. No prep required. Left message for pt to call back.

## 2016-11-04 ENCOUNTER — Other Ambulatory Visit (HOSPITAL_COMMUNITY): Payer: Self-pay | Admitting: Internal Medicine

## 2016-11-04 DIAGNOSIS — R1310 Dysphagia, unspecified: Secondary | ICD-10-CM

## 2016-11-09 ENCOUNTER — Other Ambulatory Visit (HOSPITAL_COMMUNITY): Payer: Medicare Other

## 2016-11-09 ENCOUNTER — Ambulatory Visit (HOSPITAL_COMMUNITY): Payer: Medicare Other

## 2016-11-11 ENCOUNTER — Ambulatory Visit (HOSPITAL_COMMUNITY)
Admission: RE | Admit: 2016-11-11 | Discharge: 2016-11-11 | Disposition: A | Discharge status: Home / Self Care | Payer: Medicare Other | Source: Ambulatory Visit | Attending: Internal Medicine | Admitting: Internal Medicine

## 2016-11-11 DIAGNOSIS — R1310 Dysphagia, unspecified: Secondary | ICD-10-CM | POA: Insufficient documentation

## 2016-11-16 ENCOUNTER — Ambulatory Visit: Payer: Medicare Other | Admitting: Gastroenterology

## 2016-12-07 ENCOUNTER — Encounter: Payer: Self-pay | Admitting: Pulmonary Disease

## 2017-01-15 ENCOUNTER — Other Ambulatory Visit: Payer: Self-pay | Admitting: Pulmonary Disease

## 2017-01-15 MED ORDER — LOSARTAN POTASSIUM 50 MG PO TABS: 50.0000 mg | ORAL_TABLET | Freq: Every day | ORAL | 3 refills | Status: DC

## 2017-02-10 ENCOUNTER — Encounter: Payer: Self-pay | Admitting: Pulmonary Disease

## 2017-02-10 ENCOUNTER — Ambulatory Visit (INDEPENDENT_AMBULATORY_CARE_PROVIDER_SITE_OTHER)
Admission: RE | Admit: 2017-02-10 | Discharge: 2017-02-10 | Disposition: A | Discharge status: Home / Self Care | Payer: Medicare Other | Source: Ambulatory Visit | Attending: Pulmonary Disease | Admitting: Pulmonary Disease

## 2017-02-10 ENCOUNTER — Ambulatory Visit (INDEPENDENT_AMBULATORY_CARE_PROVIDER_SITE_OTHER): Payer: Medicare Other | Admitting: Pulmonary Disease

## 2017-02-10 VITALS — BP 130/64 | HR 83 | Temp 97.7°F | Ht 64.0 in | Wt 134.6 lb

## 2017-02-10 DIAGNOSIS — Z96611 Presence of right artificial shoulder joint: Secondary | ICD-10-CM | POA: Diagnosis not present

## 2017-02-10 DIAGNOSIS — I498 Other specified cardiac arrhythmias: Secondary | ICD-10-CM | POA: Diagnosis not present

## 2017-02-10 DIAGNOSIS — J479 Bronchiectasis, uncomplicated: Secondary | ICD-10-CM | POA: Diagnosis not present

## 2017-02-10 DIAGNOSIS — Z96653 Presence of artificial knee joint, bilateral: Secondary | ICD-10-CM

## 2017-02-10 DIAGNOSIS — M150 Primary generalized (osteo)arthritis: Secondary | ICD-10-CM

## 2017-02-10 DIAGNOSIS — M159 Polyosteoarthritis, unspecified: Secondary | ICD-10-CM

## 2017-02-10 DIAGNOSIS — M8949 Other hypertrophic osteoarthropathy, multiple sites: Secondary | ICD-10-CM

## 2017-02-10 MED ORDER — LOSARTAN POTASSIUM 50 MG PO TABS: 50.0000 mg | ORAL_TABLET | Freq: Every day | ORAL | 3 refills | Status: DC

## 2017-02-10 NOTE — Patient Instructions (Signed)
Today we updated your med list in our EPIC system...    Continue your current medications the same...  Today we rechecked your CXR...    We will contact you w/ the results when available...   Continue your Advair, Mucinex, Fluids, and your chest physiotherapy to aide in mucus production & clear your airways...  Stay as active as possible...  Call for any questions...  Let's plan a follow up visit in 41mo, sooner if needed for any problems.Marland KitchenMarland Kitchen

## 2017-03-26 ENCOUNTER — Other Ambulatory Visit: Payer: Self-pay | Admitting: *Deleted

## 2017-03-26 MED ORDER — PANTOPRAZOLE SODIUM 40 MG PO TBEC: 40.0000 mg | DELAYED_RELEASE_TABLET | Freq: Every day | ORAL | 0 refills | Status: DC

## 2017-03-31 ENCOUNTER — Other Ambulatory Visit: Payer: Self-pay | Admitting: Internal Medicine

## 2017-03-31 DIAGNOSIS — K209 Esophagitis, unspecified without bleeding: Secondary | ICD-10-CM

## 2017-04-14 ENCOUNTER — Encounter: Payer: Self-pay | Admitting: Internal Medicine

## 2017-04-24 ENCOUNTER — Encounter: Payer: Self-pay | Admitting: Pulmonary Disease

## 2017-04-26 NOTE — Telephone Encounter (Signed)
Received an email from pt who is wanting some advise from Dr. [name].  Pt began to have discomfort a few weeks ago which is now more intense and consistent.  Pt states area of pain is located behind her right ear and spreads along the hairline and spreads all the way to the midline of neck.  Pt had an abscessed area on her gum which lead to the removal of a tooth February 26 and was prescribed 875mg  amoxicillin bid and naproxen sodium 550mg  tid prn  Pt was hoping the meds she was put on to help with the abscess would help with her other pain but she has not seemed a difference with that.  Dr. [name], any advice on recommendations for pt. Thanks!

## 2017-04-26 NOTE — Progress Notes (Signed)
Patient ID: Miranda Thompson, female   DOB: [birth year], 84 y.o.   MRN: [id number]  Subjective:    Patient ID: Miranda Thompson, female    DOB: Jul 27, 1933, 84 y.o.   MRN: [id number]  HPI 84 y/o WF here for a follow up visit... she has mult med problems as noted below...   ~  SEE PREV EPIC NOTES FOR OLDER DATA >>    Hx left breast cancer w/ XRT & chemorx in 1994  08/19/04>  CT Chest showed extensive area of marked cylindrical bronchiectasis on the left, some assoc calcif, no signif adenopathy, prior left breast surg...  LABS 3/14:  FLP- at goals on diet alone;  Chems- wnl;  CBC- wnl;  TSH=2.37;  VitD=31;  UA- clear...  SPUTUM 4/14 >> Pseudomonas sensitive to all antibiotics   LABS 9/14:  CBC- ok w/ Hg=13.7, WBC=6.4;  Fe=91 (25%sat)... BMet wasn't done...   CXR 3/15 showed stable film w/ scarring LUL, no change and surg clips left breast & axilla  EKG 3/15 showed NSR w/ PACs and atrial bigem, 1st degree AVB, otherw NAD...   2DEcho 3/15 showed norm LV size & function w/ EF=55-60%, norm wall motion, valves ok w/ sl thickening of leaflets- mildMR, modTR,; mild RV dil but norm RVF & PAsys=31  LABS 3/15:  FLP- ok on diet alone;  Chems- wnl;  CBC- wnl;  Fe=69 (19%sat);  VitD=49;  TSH=4.33... UA is clear, wnl...   ~  November 08, 2013:  65moROV & Miranda Thompson had an exac 6/15- seen by TP, CXR stable (NAD), treated w/ Levaquin/ Mucinex & improved, but she stopped her Advair & now notes sl incr SOB;  PFT w/ FEV1=1.22 (61%) & she is rec to restart her inhaler;  Notes chr cough w/ green-gray phlegm from her severe bronchiectasis (last pos sputum was 4/14 +Pseudomonas- pansensitive);  EXAM shows bilat rhonchi, no consolidation, and she is stable on her regular regimen and vigorous chest physiotherapy regimen to aide sputum production...     She had f/u DrNishan 4/15> hx palpit, bigeminy on EKG, improved on Aten25; no changes made...    She continues to follow up w/ DrFields> right knee pain, wears brace &  gets shots periodically, his notes are reviewed...  We reviewed prob list, meds, xrays and labs> see below for updates >> OK 2015 Flu vaccine today...   PFT 9/15 showed FVC= 2.08 (77%), FEV1= 1.22 (61%), %1sec= 59%, mid-flows= 31% predicted... c/w GOLD Stage2 COPD, pt GroupB...  Ambulatory O2 Test>  O2sat on RA at rest= 96% w/ pulse=73/min;  Ambulated 3 laps w/ lowest O2sat= 96% w/ pulse=94/min.... PLAN>> continue same Advair250Bid, Mucinex 1200Bid, Fluids, vigorous home chest PT & postural drainage, stay as active as poss...  ~  May 09, 2014:  636moOV & Miranda Thompson reports a good interval- no intercurrent infections, breathing is about the same but she notes some sl incr DOE assoc w/ decr exercise due to knee arthritis; she sees drFields and as had several shots but wonders about TKR- given the names for DrAlusio & DrOlin as contacts... We reviewed the following medical problems during today's office visit >>     Bronchiectasis & HxPneumonia> on Advair250Bid (not taking regularly) & GFN 1200Bid; PFT 9/15 w/ GOLD stage2-B COPD; mod cough, grey colored sput, intermit hemoptysis, denies f/c/s; last pos culture was 4/14= Pseudomonas, pan-sens & treated w/ Cipro at that time; she does regular ChestPT...    Palpitations & PACs> she had PACs & atrial bigeminy; improved on  Atenolol25; seen by Cherly Hensen 4/15 & 12/15- no AFib or MAT & no changes made...    CHOL> on diet alone; FLP 3/15 showed TChol 172, TG 50, HDL 56, LDL 106    GI- Divertics> asked to take Align, Metamucil, etc; she had diverticulitis flair 2012 x2 & treated by DrPatterson w/ Cipro & Flagyl    GU- HxUTI w/ pelvic pressure, back pain, freq&urgency> felt to have a chronic cystitis by DrEskridge & she understands asymptomatic bactiuria...    Hx Breast Cancer> she had surg 1994 w/ lumpectomy & LN dissection followed by XRT & Chemorx; she was released by [facility] in 2003; followed by [facility]- seen 12/15 w/ neg exam & mammogram 3/16-  OK...    DJD, LBP, Osteopenia> on Tylenol, Estradiol topically per Gyn, calcium, MVI, VitD1000; she walks >15m/d for exercise; followed by DrFields regularly; c/o incr right knee pain & she requested Ortho names for poss TKR (Alusio/ OAlvan Dame; [facility] DrRomaine=> now seeing DrSMiller...    HxEczema> she uses CoQ10,Borage Oil & black current; she likes herbal remedies...  We reviewed prob list, meds, xrays and labs> see below for updates >>   CXR 3/16 is stable- chronic changes on left, clips from prev left breast surg, DDD in Tspine...  LABS 3/16:  FLP- wnl on diet alone;  Chems- wnl;  CBC- wnl;  TSH=4.99...  ~  November 06, 2014:  634moOV & medical f/u visit>     From the pulmonary standpoint she is stable w/ her severe bronchiectasis but only using her Advair250 once daily & only taking the Mucinex 60053md; asked to incr the Advair250Bid & the Mucinex600-2Bid w/ fluids and maintain a vigorous chest PT & postural drainage routine to clear her airways...    She had f/u CARDS- DrNishan 10/25/14> hx palpit w/ atrial bigeminy on office EKG; prev tried Aten but stopped due to low endurance, 2DEcho 04/2013 showed norm EF=55-60%, no wall motion abn, mildMR, PAsys=31; he added CARDIZEM-CD 240m38mbut pt states that she didn't take it stating that she "only wants low-dose meds" => therefore rec to try CARDIZEN-CD 120mg30m.    She also notes that her BP is "all over the place" but BP monitor was apparently ok; she read her EKG on my chart & had a lot of questions; currently not taking any BP med & BP today= 140/80; I told her that the Cardizem would help regulate her blood pressure as well as help control her palpitations...     She had a routine f/u colonoscopy 08/08/14 by DrPyrtle> +FamHx colon cancer in father; 2 sessile polyps removed, 3-5mm s90m, mod diverticulosis, Path= tubular adenomas...    She saw DrFields for her knees w/ grade4 arthritis on right, cortisone shots lasted  several months, uses compression sleeve & Aleve/ Tramadol; he referred her to DrOlinGeorgetownotes "bone on bone" & rec bilat TKRs...  EXAM shows Afeb, VSS, O2sat=96% on RA;  HEENT- neg, mallampati1;  Chest- diffuse bilat rhonchi/ congestion, w/o wheezing or signs of consolidation;  Heart- RR gr1/6 SEM w/o r/g;  Abd- soft, neg;  Ext- w/o c/c/e...  EKG 06/19/14 showed NSR, rate78, poor R progression V1-3, otherw neg/NAD...  We reviewed prev CXR, LABS, etc... IMP/PLAN>>  She reminded me that her daughter didn't want her on Atenolol, & she only wants "low-dose" meds therefore try CARDIZEM-CD 120mg/d44meeds to incr the Advair250 to Bid & the Mucinex 600mg to2md w/ fluids + continue the postural  drainage/ chest PT regimen... OK 2016 flu vaccine today.  ~  December 18, 2014:  6wk ROV & recheck> Miranda Thompson has been taking the CardizemCD120 since last OV & watching her home BP checks very carefully; pressure readings are somewhat labile- 110/70 range to 160/100+ range using her home cuff; she remains asymptomatic w/o HA, CP, palpit, dizzy, edema, etc; exercise is limited by her knees and she tells me she is ready for TKR surg by DrOlin; we decided to add LOSARTAN34m/d for her BP control & she will continue to monitor it at home... EXAM shows Afeb, VSS, O2sat=96% on RA;  BP=132/74; HEENT- neg, mallampati1;  Chest- diffuse bilat rhonchi w/o wheezing or signs of consolidation;  Heart- RR gr1/6 SEM w/o r/g;  Abd- soft, neg;  Ext- w/o c/c/e... PLAN>>  She will continue all current meds and ADD LOSARTAN 519md; watch BPs at home & call for questions; she asks that I send this note to DrWabaunseeor surg from the medical standpoint...  ~  March 20, 2015:  77m67moV & Miranda Thompson had her right TKR by DrOlin 01/21/15, s/p PT, still sl stiff but exercising on bike & walking some- no complications and she is very pleased;  BP is improved w/ the addition of Losartan50 & measures 142/62 today... We reviewed the following medical  problems during today's office visit >>     Bronchiectasis & HxPneumonia> on Advair250Bid (not taking regularly) & GFN 1200Bid; PFT 9/15 w/ GOLD stage2-B COPD; mod cough, grey colored sput, intermit hemoptysis, denies f/c/s; last pos culture was 4/14= Pseudomonas, pan-sens & treated w/ Cipro at that time; she does regular ChestPT which really helps...    Palpitations & PACs> she had PACs & atrial bigeminy; improved on Atenolol25; followed by DrNCherly Hensenchanged to CardizemCD120 (she refused 240 dose), last seen 03/18/15- no AFib or MAT & no changes made...    CHOL> on diet alone; FLP 3/16 showed TChol 163, TG 70, HDL 53, LDL 96    GI- Divertics> asked to take Align, Metamucil, etc; she had diverticulitis flair in 2012 x2 & treated by DrPatterson w/ Cipro & Flagyl, ok since then.    GU- HxUTI w/ pelvic pressure, back pain, freq&urgency> felt to have a chronic cystitis by DrEskridge & she understands asymptomatic bactiuria...    Hx Breast Cancer> she had surg 1994 w/ lumpectomy & LN dissection followed by XRT & Chemorx; [facility]/ neg exam & mammogram 3/16- OK...    DJD, LBP, Osteopenia> on Tylenol, Estradiol topically per Gyn, calcium, MVI, VitD1000; she walks >1mi777mfor exercise; followed by DrFields & OlinAlvan Damep right TKR 12/2014; [facility]...     HxEczema> she uses CoQ10,Borage Oil & black current; she likes herbal remedies...  EXAM shows Afeb, VSS, O2sat=96% on RA;  HEENT- neg, mallampati1;  Chest- rhonchi L>R, w/o w/r/consolidation;  Heart- RR w/o m/r/g;  Abd- soft, nontender, neg;  Ext- s/p R-TKR, w/o c/c/e;  Neuro- intact...  EKG 03/18/15 showed NSR, rate87, prob LAE, poor R prog V1-3, otherw STTW are ok...  LABS 03/20/15> we discussed FASTING labs on return... IMP/PLAN>>  Mult medical problems as listed- MariArloastable & did well w/ the knee replacement surg; continue same meds and  her vigorous chest PT regimen; we plan recheck in 3-63mo 53moasting blood work.  ~  August 01, 2015:  4-53mo R67mo MarilyStarlaer  right TKR 12/2014 by DrOlin & is now sched for a right reverse total shoulder arthroplasty by DrNorris tomorrow!  She tells me that she still needs to have her left knee done;  She notes that her breathing is OK w/ mild chr cough, min sput production, & DOE w/o change (she walks, rides bike, etc);  She recently ret for FLabs;  We reviewed the following medical problems during today's office visit >>     Bronchiectasis & HxPneumonia> on Advair250Bid & GFN 1200Bid; she is supposed to be doing Flutter treatments and postural drainage as well; PFT 9/15 w/ GOLD stage2-B COPD; mod cough, grey colored sput, intermit hemoptysis, denies f/c/s; last pos culture was 4/14= Pseudomonas, pan-sens & treated w/ Cipro at that time; the regular ChestPT which really helps...    Palpitations & PACs> she had PACs & atrial bigeminy; improved on Atenolol25 (but c/o low endurance); followed by Cherly Hensen & changed to CardizemCD120 (she refused 240 dose) + Losar50, last seen 03/18/15- no AFib or MAT & no changes made...    CHOL> on diet alone; FLP 6/17 showed TChol 174, TG 64, HDL 62, LDL 100    GI- Divertics> asked to take Align, Metamucil, etc; she had diverticulitis flair in 2012 x2 & treated by DrPatterson w/ Cipro & Flagyl, ok since then.    GU- HxUTI w/ pelvic pressure, back pain, freq&urgency> felt to have a chronic cystitis by DrEskridge & she understands asymptomatic bactiuria...    Hx Breast Cancer> surg 1994 w/ lumpectomy & LN dissection followed by XRT & Chemorx; she was released by [facility] in 2003; followed by [place]- seen 12/15 w/ neg exam & mammogram 3/16- OK...    DJD, LBP, Osteopenia> on Tylenol, Estradiol topically per Gyn, calcium, MVI, VitD1000; she walks >78m/d for exercise; followed by DrFields & OAlvan Dame s/p right TKR 12/2014; [facility]  Gyn...     HxEczema> she uses CoQ10, Borage Oil & black current; she likes herbal remedies...  EXAM shows Afeb, VSS, O2sat=98% on RA;  HEENT- neg, mallampati1;  Chest- rhonchi L>R, w/o w/r/consolidation;  Heart- RR w/o m/r/g;  Abd- soft, nontender, neg;  Ext- s/p R-TKR, decr ROM right shoulder, w/o c/c/e;  Neuro- intact...  FLABS 07/29/15>  FLP- all parameters at goals on diet alone;  Chems- wnl x Na=132;  CBC- ok w/ Hg=12.2, MCV=97;  TSH=2.61.. IMP/PLAN>>  MMichais stable w/ her severe bronchiectasis & reminded to be diligent w/ her meds and Chest PT treatments;  she is sched for the next of her planned orthopedic procedures;  We plan ROV in 630mosooner if needed prn.  ~  August 14, 2015:  2wk ROV & add-on appt requested for increased cough, green sput production, and difficulty managing the congestion;  She tells me that she had her right shoulder replacement 2 wks ago & since then has been unable to do her postural drainage for her bronchiectasis resulting in incr cough, gren/gray mucus, incr chest congestion, low grade temp & dyspnea; she says her chest feels full;  Home meds include> ADVAIR250Bid, & Mucinex600-2Bid. But she also does a vigorous chest PT regimen w/ flutter valve treatments and postural drainage...     EXAM shows Afeb, VSS, O2sat=97% on RA;  HEENT- neg, mallampati1;  Chest- congested, rhonchi L>R, w/o w/r/consolidation;  Heart- RR w/o m/r/g;  Ext- s/p R-TKR, s/p R shoulder surg/ sling,..   Sput C&S>  Culture grew PSEUDOMONAS (again) & it remains sens to CIPRO...Marland KitchenMarland Kitchen  IMP/PLAN>>  Pt given Duoneb treatment here in office w/ mod thick green sputum=> sent for C&S which grew Pseudomonas sens to Cipro; we treated her w/ Cipro500Bid & NEB w/ Duoneb Tid regularly; she knows to return to her postural drainage treatments as soon as her shoulder recuperation will allow...   ~  February 03, 2016:  68moRDumontcalled several times thru 12/2015 1st notingabscessed tooth treated by her dentist w/  Amocicillin x10d; then she developed a sore throat, URI, cough w/ yellow sput & low grade fever; we had her bring in a sput specimen (initially mult species, but later [place] Psuedomonas [place] and treated her w/ Levaqun '750mg'$ x10d + yogurt; she had some intermittent cough w/ streaky hemoptysis, continued her vigorous home COPD regimen & gradually improved... Her Left-TKR surg is now sched for 03/23/16 per DrOlin...    Bronchiectasis & HxPneumonia> on Advair250Bid & GFN 1200Bid; she has refused NEBULIZER meds; she is supposed to be doing Flutter treatments and postural drainage as well; PFT 9/15 w/ GOLD stage2-B COPD; mod cough, grey colored sput, intermit hemoptysis, denies f/c/s; last pos culture was 4/14= Pseudomonas, pan-sens & treated w/ Cipro at that time; the regular ChestPT which really helps...    Palpitations & PACs> she had PACs & atrial bigeminy; improved on Atenolol25 (but c/o low endurance); followed by DCherly Hensen& changed Aten to CardizemCD120 (she refused 240 dose) + Losar50, last seen 03/18/15- no AFib or MAT & no changes made...    EXAM shows Afeb, VSS, O2sat=97% on RA;  HEENT- neg, mallampati1;  Chest- congested, rhonchi L>R, w/o w/r/consolidation;  Heart- RR w/o m/r/g;  Ext- s/p R-TKR, s/p R shoulder surg;  Neuro- neg, intact...  Sput C&S 01/01/16>  Mod PSEUDOMONAS- resist to Cipro/ Levaq and sens- Fortaz, Primaxin, Piperacillin...   CXR 01/28/16>  Norm heart size, hyperinflated w/ chr changes of scarring & bronchiectasis on left- NAD, osteopenia, right shoulder prosthesis, clips over left breast & left axilla  LABS 01/28/16>  Chems- wnl;  CBC- Hg=12.4, otherw wnl... IMP/PLAN>>  It would be additionally beneficial for her to be on a NEB e/ med Tid=Qid before her chest PT/ postural drainage treatments but she is quite sure she cannot tolerate the neb rx;  Her orthopedic issues mount & every surgery/ every general anesthetic takes a toll;  Rec to continue Advair250Bid, Mucinex  1200Bid, fluids, and the chest Rx...  ~  August 05, 2016:  622moOV & Miranda Thompson's CC is some loose stool occurring every 2-3d, [facility] we discussed Metamucil rx in less water & stop her Mag500-2/d supplement;  Otherw feeling OK sl dry cough at night, small amt gray-green phlegm, no blood, notes SOB/ DOE w/ stairs/ walking; no CP/ palpit, etc on her ex-bike... We reviewed interval Epic notes >>     She had left TKR by DrOlin 03/23/16>  Did well w/ surg & disch w/ [facility] therapy...    She saw GYN-DebLeonard 05/26/16 for her well-woman exam>  [id number], using Yuvafem, no issues...    She saw CARDS-DrNishan 06/11/16>  Hx palpit, atrial bigeminy on office EKG, prev 2DEcho was ok- PAsys=31; no hx MAT or AFib; good control of BP on Cardizem/Losartan;  Felt to be stable- no changes made...  We reviewed the following medical problems during today's office visit >>     Bronchiectasis & HxPneumonia> on Advair250Bid & GFN 1200Bid; she is supposed to be doing Flutter treatments and postural drainage as  well; PFT 9/15 w/ GOLD stage2-B COPD; mod cough, grey colored sput, intermit hemoptysis, denies f/c/s; last pos culture was 4/14= Pseudomonas, pan-sens & treated w/ Cipro at that time; the regular ChestPT which really helps...    Palpitations & PACs> she had PACs & atrial bigeminy; improved on Atenolol25 (but c/o low endurance); followed by Cherly Hensen & changed to CardizemCD120 (she refused 240 dose) + Losar50, last seen 05/2016- no AFib or MAT & no changes made...    CHOL> on diet alone; FLP 6/17 showed TChol 174, TG 64, HDL 62, LDL 100    GI- Divertics> asked to take Align, Metamucil, etc; she had diverticulitis flair in 2012 x2 & treated by DrPatterson w/ Cipro & Flagyl, ok since then.    GU- HxUTI w/ pelvic pressure, back pain, freq&urgency> felt to have a chronic cystitis by DrEskridge & she understands asymptomatic bactiuria...    Hx Breast Cancer> surg 1994 w/ lumpectomy & LN dissection  followed by XRT & Chemorx; she was [facility]; followed by [place]- seen 05/2016 w/ neg exam & mammogram 4/18- OK...    DJD, LBP, Osteopenia> on Tylenol, Estradiol topically per Gyn, calcium, MVI, VitD1000; she walks >79m/d for exercise; followed by DrFields & OAlvan Dame s/p right TKR 12/2014 & left TKR 02/2016; [facility].    HxEczema> she uses CoQ10, Borage Oil & black current; she likes herbal remedies...  EXAM shows Afeb, VSS, O2sat=97% on RA;  HEENT- neg, mallampati1;  Chest- rhonchi L>R, w/o w/r/consolidation;  Heart- RR w/o m/r/g;  Abd- soft, nontender, neg;  Ext- s/p R-TKR, decr ROM right shoulder, w/o c/c/e;  Neuro- intact...  Labs 08/05/16>  Chems- ok x Na=130, Cr=0.59, LFTs wnl;  Mg=2.1;  CBC- wnl w/ Hg=13.2, mcv=96;  Fe=169 (46%sat), Ferritin=64;  TSH=2.40;  VitD=40 > same meds, consider some gatorade... IMP/PLAN>>  MAlexandrinais rec to stop the Mag supplement & add metamucil w/ sm amt water;  We reviewed her vigorous bronchiectasis regimen w/ Advair, GFN, fluids, chest PT & postural drain;  Continue other meds the same & call for any breathing issues...    ~  February 10, 2017:  686moOV & Miranda Thompson reports that she is "doing great" breathing about the same w/ mild cough, w/ small amt gray sput, no blood, SOB w/ stairs and walking but stable- no deterioration & she continues ex-bike/ floor exercises/ etc; denies CP/ palpit/edema, and no f/c/s...  We reviewed the following interval Epic entries >>     [facility]  C/o chest & abd pain- occurred while eating, resolved spont in 155m Exam was unremarkable, Labs- ok x Na=130, BS=141, and norm CBC/ lipase/ troponin; EKG at baseline w/o acute changes;  CXR showed chr changes in lingula & RML, ?nodule in right mid-lung near prev scarring;  Pt reassured- no additional therapy & referred back to PCP for outpt eval...     She saw GI-DrPyrtle 10/15/16>  S/p surveillance colon 07/2014 w/ 2  small polyps (tub adenomas), mod diverticulosis; noted episode of ?esoph spasm w/ CP & outlined this eval >>                 ~Ba Swallow/ UGI 10/21/16>  IMPRESSION: 1. Mild discoordination of swallowing with residue in the vallecula and piriform sinuses after swallows, and with laryngeal penetration of contrast down to the cords. This did not elicit a spontaneous cough response. 2. Mild nonspecific esophageal dysmotility. Partial disruption of  primary peristaltic waves in the midthoracic esophagus. 3. Mild distal esophageal fold thickening may reflect mild esophagitis. No ulceration...           ~EGD 10/29/16 by DrPyrtle>  Mild acid reflux esophagitis, 4cm HH, stomach 7 duodenum- ok...  Rx=> Protonix'40mg'$ /d...            ~Modified Ba Swallow by speech path 11/11/16>  FINDINGS:  Thin liquid- with swallows from a straw, patient demonstrated vallecular and piriform sinus retention. This clears primarily with dry swallows. Single episode of flash penetration.   Nectar thick liquid- vallecular retention.   Pure- within normal limits   Barium tablet -  within normal limits   IMPRESSION:  Mild swallow dysfunction, primarily as evidenced by vallecular and piriform sinus retention. We reviewed the following medical problems during today's office visit >>     Bronchiectasis & HxPneumonia> on Advair250Bid & GFN 1200Bid; she is supposed to be doing Flutter treatments and postural drainage as well; PFT 9/15 w/ GOLD stage2-B COPD; mod cough, grey colored sput, intermit hemoptysis, denies f/c/s; last pos culture was 4/14= Pseudomonas, pan-sens & treated w/ Cipro at that time; the regular ChestPT which really helps...    Palpitations & PACs> she had PACs & atrial bigeminy; improved on Atenolol25 (but c/o low endurance); followed by Cherly Hensen & changed to CardizemCD120 (she refused 240 dose) + Losar50, last seen 05/2016- no AFib or MAT & no changes made...    CHOL> on diet alone; FLP 6/17 showed TChol 174, TG 64, HDL 62, LDL 100     GI- Dysphagia, 4cmHH, esophagitis> eval by DrPyrtle 10/2016=> on Protonix40, + swallowing strategies by speech path    GI- Divertics> asked to take Align, Metamucil, etc; she had diverticulitis flair in 2012 x2 & treated by DrPatterson w/ Cipro & Flagyl, ok since then.    GU- HxUTI w/ pelvic pressure, back pain, freq&urgency> felt to have a chronic cystitis by DrEskridge & she understands asymptomatic bactiuria...    Hx Breast Cancer> surg 1994 w/ lumpectomy & LN dissection followed by XRT & Chemorx; she was [facility]; followed by [place]- seen 05/2016 w/ neg exam & mammogram 4/18- OK...    DJD, LBP, Osteopenia> on Tylenol, Estradiol topically per Gyn, calcium, MVI, VitD1000; she walks >96m/d for exercise; followed by DrFields & OAlvan Dame s/p right TKR 12/2014 & left TKR 02/2016; rt shoulder arthro[facility]; [facility].    HxEczema> she uses CoQ10, Borage Oil & black current; she likes herbal remedies...  EXAM shows Afeb, VSS, O2sat=99% on RA;  HEENT- neg, mallampati1;  Chest- rhonchi L>R, w/o w/r/consolidation;  Heart- RR w/o m/r/g;  Abd- soft, nontender, neg;  Ext- s/p R-TKR, decr ROM right shoulder, w/o c/c/e;  Neuro- intact...  CXR 12/19/128 (independently reviewed by me in the PACS system) showed stable heart size, enlarged central PAs, hyperinflation w/ chr fibro-interstitial changes, apical scarring, surg changes on left, chr reticular opac LUL, sm nodular opac right mid zone, no acute dis...  IMP/PLAN>>  MAmilyahis clinically stable at 853w/ her severe pulm dis- COPD, bronchiectasis, hx pneumonias, post-inflamm scarring;  Rec to continue her ADVAIR Bid, Mucinex '1200mg'$ Bid, Fluids, and an anti-reflux regimen...           Problem List:     ALLERGIC RHINITIS (ICD-477.9) - she uses OTC antihistamines Prn.  BRONCHIECTASIS (ICD-494.0) & Hx of PNEUMONIA (ICD-486) - on ADVAIR 250 Bid, & MUCINEX 1-2Bid...  Long hx severe bronchiectasis,  granulomatous lung dis, and pneumonia (initially Dx 1968 by DrFrazier, subseq bx= noncaseating gran dz & chr inflam)... her last bronchoscopy was 4/04 revealing some mucus plugging... CTChest 4/04 showed marked bronchiectasis in lingular, LLL, RML, and mucus plugging... CT repeated 6/06- similar. ~  Sputum cultures 5/08 grew Serratia (R to amox/ceph, S to all others), branching part acid fast bacilli resembling nocardia, yeast c/w candida... ~  CXR 1/09 chr lung dis, no change from prev w/ left mid lung zone as the worst area... ~  CXR 3/10 w/ chr lung changes and NAD suspected... ~  Sputum 8/10 showed NTF only & AFB smears & cult all neg... ~  CXR 3/11 showed chr lung dis, no acute changes, stable... ~  CXR 3/12 showed chronic changes, scarring, NAD.Marland Kitchen. ~  CXR 3/13 showed essentially no change> normal heart size, prom pulm arts, bilat interstitial prominence & nodularity, bronchiectasis, NAD, surg clips from prev left breast ca surg. ~  9/13: she presents w/ an acute exac w/ cough, thick sput, congestion, fever, SOB, & chest discomfort; CXR shows worse left mid zone opacity; We decided to treat w/ empiric Levaquin x10d pending cult report==> ret w/ resist Pseudomonas, but pt states better/ improved on the Levaquin. ~  Sputum C&S 4/14 showed Pseudomonas sens to Cefepime, Tressie Ellis, Cipro, Levaquin, etc ~  3/15 & 9/15: on Advair250Bid (not taking regularly) & GFN 1200Bid; mod cough, grey colored sput, intermit hemoptysis, denies f/c/s; last pos culture was 4/14= Pseudomonas, pan-sens & treated w/ Cipro. ~  CXR 3/15 showed stable film w/ scarring LUL, no change and surg clips left breast & axilla... ~  PFTs 9/15 showed FVC= 2.08 (77%), FEV1= 1.22 (61%), %1sec= 59%, mid-flows= 31% predicted... c/w GOLD Stage2 COPD, pt GroupB... ~  3/16: on Advair250Bid & GFN 1200Bid (asked to take regularly); PFT 9/15 w/ GOLD stage2-B COPD; mod cough, grey colored sput, intermit hemoptysis, denies f/c/s; last pos culture was  4/14= Pseudomonas, pan-sens & treated w/ Cipro at that time; she does regular ChestPT... ~  CXR 3/16 is stable- chronic changes on left, clips from prev left breast surg, DDD in Tspine ~  9/16: she is reminded to use the Advair250Bid & the Mucinex600-2Bid w/ fluids and maintain a vigorous chest PT & postural drainage regimen...  PALPITATIONS >> followed by Barton Fanny on Atenolol25 & improved... ~  2DEcho 3/15 showed norm LV size & function w/ EF=55-60% and no regional wall motion abn, mild MR, mild TR, PAsys=25mHg... ~  9/16: she reminds me that daughter does not want her on Atenolol; DrNishan ordered CardizemCD240 but she "only wants low dose meds"; therefore try CardizemCD120/d... ~  10/16: pt returns w/ extensive BP records on her CardizemCD120/d; BP today= 132/74 but BP at home up to 160/100+ and we decided to add LOSATAN '50mg'$ /d to her regimen...   HYPERCHOLESTEROLEMIA, BORDERLINE (ICD-272.4) - on diet alone... ~  FLP 9/10 showed TChol 174, TG 64, HDL 49, LDL 112 ~  FLP 9/11 showed TChol 175, TG 61, HDL 48, LDL 115 ~  FLP 3/13 showed TChol 176, TG 69, HDL 64, LDL 98 ~  FLP 9/13 showed TChol 169, TG 65, HDL 48, LDL 108 ~  FLP 3/14 showed TChol 157, TG 54, HDL 46, LDL 100 ~  FLP 3/15 showed TChol 172, TG 50, HDL 56, LDL 106 ~  FLP 3/16 showed TChol 163, TG 70, HDL 53, LDL 96  Hx of ADENOCARCINOMA, BREAST (ICD-174.9) - [facility] and  LN dissection (stage II 1.0  cm tumor), w/ post op XRT and chemotherapy... she was followed by [facility] for oncology... then [facility] (released in 2003). ~  routine Mammogram 2/10 was neg- f/u planned 14yr ~  f/u mammogram 2/11 = neg, NAD, f/u planned 129yr~  f/u mammogram 2/12 = neg, NAD, f/u 1y63yr  f/u mammogram 2/13 = neg, NAD... Marland Kitchen  f/u mammogram 2/14 = neg, scat parenchymal densities... ~  [facility]... Marland Kitchen  [facility]....  DIVERTICULOSIS OF COLON  (ICD-562.10) - colonoscopy 3/05 by DrPatterson showed divertics only... she has +fam hx colon ca w/ colon check [id number] f/u colonoscopy 5/11 showed +divertics, no polyps... ~  3/12:  Hx, exam & CTAbd c/w diverticulitis; treated w/ Cipro/ Flagyl & resolved;  Advised Metamucil, Miralax, etc. ~  11/12:  She had f/u DrPatterson after another bout of diverticulitis; he notes regular BMs, good appetite, stable weight; he wanted her to STOP Naprosyn, ok to use Tylenol/ Tramadol, rec hi fiber, saw the [facility]. ~  She had a routine f/u colonoscopy 08/08/14 by DrPyrtle> +FamHx colon cancer in father; 2 sessile polyps removed, 3-5mm 66me, mod diverticulosis, Path= tubular adenomas...  DEGENERATIVE JOINT DISEASE (ICD-715.90) - see Ortho eval 2010 by DrDaldorf... she takes Glucosamine & Herbal supplements- milk thistle/ tumeric which she says really helps... ~  4/14: she saw DrKarlFields for SportsMed> c/o bilat knee pain, known arthritis, uses body helix knee (compression) sleeves & Tramadol prn; takes tart red cherry oil daily & he rec devil's claw, tumeric, & curcumin... ~  3/15: she continues to f/u w/ drFields for knee shots periodically... ~  3/16: she is requesting name of Ortho for poss [place]  She is managed by DrFields and DrOlin => told she needs TKRs & we will send medical clearance...  LOW BACK PAIN SYNDROME (ICD-724.2) - [facility] showed herniated disc L3-4 w/ mod spinal stenosis and lat recess stenosis bilat...   ROTATOR CUFF SYNDROME, RIGHT (ICD-726.10) - [facility]/ [facility].  OSTEOPENIA (ICD-733.90) - [facility] norm spine, norm hip, osteopenic forearm (-2.0)... followed by GYN = DrRomaine who is treating a low Vit D level... she takes calcium, Vitamins, & Vit D 1000u daily from DrRomCampo  labs 9/10 on 50K weekly showed Vit D level = 42... rec> change to 1000 u daily. ~  labs 9/11 on  1000 u daily showed Vit D level = 34... rec> continue daily supplement. ~  Labs 3/13 showed Vit D level = 35... Continue supplements. ~  Labs 3/14 showed Vit D level = 31 ~  Labs 3/15 showed Vit D level = 49... Continue supplements  Hx of ANEMIA (ICD-285.9) -  resolved... ~  labs 9/10 showed Hg= 13.6 ~  labs 9/11 showed Hg= 13.2 ~  Labs 3/13 showed Hg= 13.4 ~  Labs 3/14 showed Hg= 13.3 ~  Labs 3/15 showed Hg= 13.4 ~  Labs 3/16 showed Hg= 13.1  ECZEMA - she has hx of eczema on her hands and has used CoQ10 and Borage Oil as rec by an her[facility]Administrat[facility]nn.[place] changed to [facility] instead of these other supplements & rash is much improved...   Past Surgical History:  Procedure Laterality Date  . COLONOSCOPY    . left breast lumpectomy and LN dissection  06/1992   Dr. NewmaLucia GaskinsUNG BIOPSY  1968   TSurg---Dr. [name]  . LUNG BIOPSY  2012   dr [name]  . POLYPECTOMY    . REVERSE SHOULDER ARTHROPLASTY Right 08/02/2015   Procedure: RIGHT REVERSE TOTAL SHOULDER ARTHROPLASTY;  Surgeon: [name], MD;  Location: [place];  Service: Orthopedics;  Laterality: Right;  . REVERSE TOTAL SHOULDER ARTHROPLASTY Right 08/02/2015  . TONSILLECTOMY    . TOTAL KNEE ARTHROPLASTY Right 01/21/2015   Procedure: TOTAL RIGHT KNEE ARTHROPLASTY;  Surgeon: [name], MD;  Location: [facility];  Service: Orthopedics;  Laterality: Right;  . TOTAL KNEE ARTHROPLASTY Left 03/23/2016   Procedure: LEFT TOTAL KNEE ARTHROPLASTY;  Surgeon: [name], MD;  Location: [facility];  Service: Orthopedics;  Laterality: Left;    Outpatient Encounter Medications as of 02/10/2017  Medication Sig  . Calcium Carb-Cholecalciferol 600-800 MG-UNIT TABS Take 1 tablet by mouth daily.  . cholecalciferol (VITAMIN D) 1000 UNITS tablet Take 1,000 Units by mouth daily.  Marland Kitchen diltiazem (CARDIZEM CD) 120 MG 24 hr capsule Take 1 capsule (120 mg total) by mouth daily.  . Fluticasone-Salmeterol (ADVAIR) 250-50 MCG/DOSE AEPB Inhale 1 puff into the  lungs daily.  . Guaifenesin 1200 MG TB12 Take 600 mg by mouth 2 (two) times daily.   Marland Kitchen losartan (COZAAR) 50 MG tablet Take 1 tablet (50 mg total) by mouth daily.  . Multiple Vitamins-Minerals (MULTIVITAMIN & MINERAL PO) Take 1 tablet by mouth daily.    . Probiotic Product (ALIGN PO) Take 1 tablet by mouth daily.   Merril Abbe 10 MCG TABS vaginal tablet TAKE 1 TABLET (10 MCG TOTAL) BY MOUTH 2 (TWO) TIMES A WEEK.  . [DISCONTINUED] losartan (COZAAR) 50 MG tablet Take 1 tablet (50 mg total) by mouth daily.  . [DISCONTINUED] pantoprazole (PROTONIX) 20 MG tablet Take 1 tablet (20 mg total) by mouth daily. Pantoprazole '20mg'$ . Once daily (30 minutes before breakfast)  For acid reflux with esophagitis.  . [DISCONTINUED] pantoprazole (PROTONIX) 40 MG tablet Take 1 tablet (40 mg total) by mouth 2 (two) times daily before a meal. Pantoprazole 40 mg. Twice daily (before breakfast and dinner) for 4 weeks, then once daily thereafter.  . [DISCONTINUED] pantoprazole (PROTONIX) 40 MG tablet Take 40 mg by mouth daily.   Facility-Administered Encounter Medications as of 02/10/2017  Medication  . 0.9 %  sodium chloride infusion    No Known Allergies    Immunization History  Administered Date(s) Administered  . Influenza Split 12/03/2011  . Influenza Whole 11/30/2007, 11/30/2008, 12/03/2009, 11/24/2010  . Influenza, High Dose Seasonal PF 11/19/2015  . Influenza,inj,Quad PF,6+ Mos 11/10/2012, 11/08/2013, 11/06/2014  . Influenza-Unspecified 12/02/2016  . Pneumococcal Conjugate-13 05/09/2014  . Pneumococcal Polysaccharide-23 11/07/2007  . Tdap 05/09/2014     Current Medications, Allergies, Past Medical History, Past Surgical History, Family History, and Social History were reviewed in [employer] record.     Review of Systems         See HPI - all other systems neg except as noted...  The patient complains of dyspnea on exertion and abdominal pain.  The patient denies anorexia,  fever, weight loss, weight gain, vision loss, decreased hearing, hoarseness, chest pain, syncope, peripheral edema, prolonged cough, headaches, hemoptysis, melena, hematochezia, severe indigestion/heartburn, hematuria, incontinence, muscle weakness, suspicious skin lesions, transient blindness, difficulty walking, depression, unusual weight change, abnormal bleeding, enlarged lymph nodes, and angioedema.     Objective:   Physical Exam     WD, WN, 84 y/o WF in NAD... GENERAL:  Alert & oriented; pleasant & cooperative... HEENT:  [place]/AT, EOM-wnl, PERRLA, EACs-clear, TMs-wnl, NOSE-clear, THROAT-clear & wnl. NECK:  Supple w/ fairROM; no JVD; normal carotid impulses w/o bruits; no thyromegaly or nodules palpated; no lymphadenopathy. CHEST:  scat bilat rhonchi/ congestion without wheezing, rales or signs of consolidation... HEART:  Regular Rhythm; without murmurs/ rubs/ or gallops heard... ABDOMEN:  Soft w/ mild tender LLQ; normal bowel sounds; no organomegaly or masses detected. EXT: without deformities or arthritic changes; no varicose veins/ venous insuffic/ or edema. NEURO:  CN's intact;  no focal neuro deficits... DERM:  No lesions noted; no rash etc...  RADIOLOGY DATA:  Reviewed in the EPIC EMR & discussed w/ the patient...  LABORATORY DATA:  Reviewed in the EPIC EMR & discussed w/ the patient...    Assessment & Plan:    COPD/ Bronchiectasis/ Hx pneumonia>  On Advair250, Mucinex2Bid, Fluids & vigorous Chest PT/ postural drainage... 9/13> treating acute exac w/ Levaquin x10d; Cult grew a resist Pseudomonas but she reports clinically improved, therefore continue present Rx & follow...  Repeat Sput C&S 4/14 showed pansens Pseudomonas... Baseline CXRs showed CXR 3/15 & 3/16 showed scarring LUL, no change, and surg clips left breast & axilla... PFTs 9/15 showed FVC= 2.08 (77%), FEV1= 1.22 (61%), %1sec= 59%, mid-flows= 31% predicted... c/w GOLD Stage2 COPD, pt GroupB... REMINDED TO TAKE MEDS  REGULARLY & MAINTAIN A VIGOROUS REGIMEN OF CHEST PT & POSTURAL DRAINAGE... 07/2015> she developed a bronchiectasis exac after R shoulder surg when she was no longer able to do her chest Pt & postural drain=> cult grew pseudomonas sens to cipro=> treated w/ this + home NEB using DUONEB Tid... 02/03/16>  It would be additionally beneficial for her to be on a NEB w/ med Tid-Qid before her chest PT/ postural drainage treatments but she is quite sure she cannot tolerate the neb rx;  Her orthopedic issues mount & every surgery/ every general anesthetic takes a toll;  Rec to continue Advair250Bid, Mucinex 1200Bid, fluids, and the chest Rx. 08/05/16>   Miranda Thompson is rec to stop the Mag supplement & add metamucil w/ sm amt water;  We reviewed her vigorous bronchiectasis regimen w/ Advair, GFN, fluids, chest PT & postural drain;  Continue other meds the same & call for any breathing issues. 02/10/17>   Miranda Thompson is clinically stable at 38 w/ her severe pulm dis- COPD, bronchiectasis, hx pneumonias, post-inflamm scarring;  Rec to continue her ADVAIR Bid, Mucinex '1200mg'$ Bid, Fluids, and an anti-reflux regimen...   HBP & palpit (PACs)>  followed by Cherly Hensen; on CardizemCD120 & she does not want BBlockers, & only wants "low-dose" meds... 10/16>   BP at home variable w/ 160/100+ episodes so we decided to add low dose LOSARTAN '50mg'$ /d... 1/17>   BP much better w/ the Losar50 added- continue same...   CHOL>  On diet alone & f/u FLP is at goal...  Breast Cancer>  [facility] & oncology f/u by DrNeijstrom, then [facility] 2003 & released; yearly mammograms & monthly self exams were wnl...  Divertics>  She had acute diverticulitis 3/12 & 10/12> resolved w/ diet adjust & Cipro/Flagyl, now back to baseline & advised re- Metamucil, Fiberall, etc; she saw DrPatterson 11/12.  DJD/ LBP>  Ortho is DrOlin & DrFields; she takes Glucosamine & Herbs;  11/16>   s/p right TKR 12/2014 by DrOlin 6/17>   sched for right total  shoulder replacement by DrNorris  Osteopenia/ Vit D defic>  On Calcium, MVI, Vit D supplement; followed by GYN= DrRomaine...  Other medical issues as noted.Marland KitchenMarland Kitchen  Patient's Medications  New Prescriptions   No medications on file  Previous Medications   CALCIUM CARB-CHOLECALCIFEROL 600-800 MG-UNIT TABS    Take 1 tablet by mouth daily.   CHOLECALCIFEROL (VITAMIN D) 1000 UNITS TABLET    Take 1,000 Units by mouth daily.   DILTIAZEM (CARDIZEM CD) 120 MG 24 HR CAPSULE    Take 1 capsule (120 mg total) by mouth daily.   FLUTICASONE-SALMETEROL (ADVAIR) 250-50 MCG/DOSE AEPB    Inhale 1 puff into the lungs daily.   GUAIFENESIN 1200 MG TB12    Take 600 mg by mouth 2 (two) times daily.    MULTIPLE VITAMINS-MINERALS (MULTIVITAMIN & MINERAL PO)    Take 1 tablet by mouth daily.     PROBIOTIC PRODUCT (ALIGN PO)    Take 1 tablet by mouth daily.    YUVAFEM 10 MCG TABS VAGINAL TABLET    TAKE 1 TABLET (10 MCG TOTAL) BY MOUTH 2 (TWO) TIMES A WEEK.  Modified Medications   Modified Medication Previous Medication   LOSARTAN (COZAAR) 50 MG TABLET losartan (COZAAR) 50 MG tablet      Take 1 tablet (50 mg total) by mouth daily.    Take 1 tablet (50 mg total) by mouth daily.   PANTOPRAZOLE (PROTONIX) 40 MG TABLET pantoprazole (PROTONIX) 40 MG tablet      Take 1 tablet (40 mg total) by mouth daily.    Take 40 mg by mouth daily.  Discontinued Medications   PANTOPRAZOLE (PROTONIX) 20 MG TABLET    Take 1 tablet (20 mg total) by mouth daily. Pantoprazole '20mg'$ . Once daily (30 minutes before breakfast)  For acid reflux with esophagitis.   PANTOPRAZOLE (PROTONIX) 40 MG TABLET    Take 1 tablet (40 mg total) by mouth 2 (two) times daily before a meal. Pantoprazole 40 mg. Twice daily (before breakfast and dinner) for 4 weeks, then once daily thereafter.

## 2017-04-27 ENCOUNTER — Ambulatory Visit (INDEPENDENT_AMBULATORY_CARE_PROVIDER_SITE_OTHER): Payer: Medicare Other | Admitting: Pulmonary Disease

## 2017-04-27 ENCOUNTER — Other Ambulatory Visit (INDEPENDENT_AMBULATORY_CARE_PROVIDER_SITE_OTHER): Payer: Medicare Other

## 2017-04-27 ENCOUNTER — Encounter: Payer: Self-pay | Admitting: Pulmonary Disease

## 2017-04-27 VITALS — BP 118/64 | HR 93 | Temp 97.6°F | Ht 64.0 in | Wt 136.0 lb

## 2017-04-27 DIAGNOSIS — Z96611 Presence of right artificial shoulder joint: Secondary | ICD-10-CM

## 2017-04-27 DIAGNOSIS — R51 Headache: Secondary | ICD-10-CM

## 2017-04-27 DIAGNOSIS — M8949 Other hypertrophic osteoarthropathy, multiple sites: Secondary | ICD-10-CM

## 2017-04-27 DIAGNOSIS — M542 Cervicalgia: Secondary | ICD-10-CM

## 2017-04-27 DIAGNOSIS — J479 Bronchiectasis, uncomplicated: Secondary | ICD-10-CM | POA: Diagnosis not present

## 2017-04-27 DIAGNOSIS — R519 Headache, unspecified: Secondary | ICD-10-CM

## 2017-04-27 DIAGNOSIS — Z96653 Presence of artificial knee joint, bilateral: Secondary | ICD-10-CM

## 2017-04-27 DIAGNOSIS — M159 Polyosteoarthritis, unspecified: Secondary | ICD-10-CM

## 2017-04-27 DIAGNOSIS — K047 Periapical abscess without sinus: Secondary | ICD-10-CM

## 2017-04-27 DIAGNOSIS — I498 Other specified cardiac arrhythmias: Secondary | ICD-10-CM

## 2017-04-27 DIAGNOSIS — M150 Primary generalized (osteo)arthritis: Secondary | ICD-10-CM

## 2017-04-27 LAB — COMPREHENSIVE METABOLIC PANEL
ALT: 11 U/L (ref 0–35)
AST: 19 U/L (ref 0–37)
Albumin: 3.8 g/dL (ref 3.5–5.2)
Alkaline Phosphatase: 69 U/L (ref 39–117)
BUN: 12 mg/dL (ref 6–23)
CO2: 31 mEq/L (ref 19–32)
Calcium: 9.5 mg/dL (ref 8.4–10.5)
Chloride: 98 mEq/L (ref 96–112)
Creatinine, Ser: 0.56 mg/dL (ref 0.40–1.20)
GFR: 110.17 mL/min (ref >60.00)
Glucose, Bld: 89 mg/dL (ref 70–99)
Potassium: 4.1 mEq/L (ref 3.5–5.1)
Sodium: 132 mEq/L — ABNORMAL LOW (ref 135–145)
Total Bilirubin: 0.6 mg/dL (ref 0.2–1.2)
Total Protein: 7.2 g/dL (ref 6.0–8.3)

## 2017-04-27 LAB — CBC WITH DIFFERENTIAL/PLATELET
Basophils Absolute: 0.1 10*3/uL (ref 0.0–0.1)
Basophils Relative: 1.4 % (ref 0.0–3.0)
Eosinophils Absolute: 0.2 10*3/uL (ref 0.0–0.7)
Eosinophils Relative: 4.4 % (ref 0.0–5.0)
HCT: 37 % (ref 36.0–46.0)
Hemoglobin: 12.7 g/dL (ref 12.0–15.0)
Lymphocytes Relative: 15.7 % (ref 12.0–46.0)
Lymphs Abs: 0.9 10*3/uL (ref 0.7–4.0)
MCHC: 34.3 g/dL (ref 30.0–36.0)
MCV: 96 fl (ref 78.0–100.0)
Monocytes Absolute: 0.5 10*3/uL (ref 0.1–1.0)
Monocytes Relative: 9.3 % (ref 3.0–12.0)
Neutro Abs: 3.9 10*3/uL (ref 1.4–7.7)
Neutrophils Relative %: 69.2 % (ref 43.0–77.0)
Platelets: 253 10*3/uL (ref 150.0–400.0)
RBC: 3.85 Mil/uL — ABNORMAL LOW (ref 3.87–5.11)
RDW: 12.2 % (ref 11.5–15.5)
WBC: 5.6 10*3/uL (ref 4.0–10.5)

## 2017-04-27 LAB — SEDIMENTATION RATE: Sed Rate: 17 mm/hr (ref 0–30)

## 2017-04-27 LAB — TSH: TSH: 2.67 u[IU]/mL (ref 0.35–4.50)

## 2017-04-27 NOTE — Progress Notes (Signed)
Patient ID: Miranda Thompson, female   DOB: [birth year], 84 y.o.   MRN: [id number]  Subjective:    Patient ID: Miranda Thompson, female    DOB: Jul 27, 1933, 84 y.o.   MRN: [id number]  HPI 84 y/o WF here for a follow up visit... she has mult med problems as noted below...   ~  SEE PREV EPIC NOTES FOR OLDER DATA >>    Hx left breast cancer w/ XRT & chemorx in 1994  08/19/04>  CT Chest showed extensive area of marked cylindrical bronchiectasis on the left, some assoc calcif, no signif adenopathy, prior left breast surg...  LABS 3/14:  FLP- at goals on diet alone;  Chems- wnl;  CBC- wnl;  TSH=2.37;  VitD=31;  UA- clear...  SPUTUM 4/14 >> Pseudomonas sensitive to all antibiotics   LABS 9/14:  CBC- ok w/ Hg=13.7, WBC=6.4;  Fe=91 (25%sat)... BMet wasn't done...   CXR 3/15 showed stable film w/ scarring LUL, no change and surg clips left breast & axilla  EKG 3/15 showed NSR w/ PACs and atrial bigem, 1st degree AVB, otherw NAD...   2DEcho 3/15 showed norm LV size & function w/ EF=55-60%, norm wall motion, valves ok w/ sl thickening of leaflets- mildMR, modTR,; mild RV dil but norm RVF & PAsys=31  LABS 3/15:  FLP- ok on diet alone;  Chems- wnl;  CBC- wnl;  Fe=69 (19%sat);  VitD=49;  TSH=4.33... UA is clear, wnl...   ~  November 08, 2013:  65moROV & Miranda Thompson had an exac 6/15- seen by TP, CXR stable (NAD), treated w/ Levaquin/ Mucinex & improved, but she stopped her Advair & now notes sl incr SOB;  PFT w/ FEV1=1.22 (61%) & she is rec to restart her inhaler;  Notes chr cough w/ green-gray phlegm from her severe bronchiectasis (last pos sputum was 4/14 +Pseudomonas- pansensitive);  EXAM shows bilat rhonchi, no consolidation, and she is stable on her regular regimen and vigorous chest physiotherapy regimen to aide sputum production...     She had f/u DrNishan 4/15> hx palpit, bigeminy on EKG, improved on Aten25; no changes made...    She continues to follow up w/ DrFields> right knee pain, wears brace &  gets shots periodically, his notes are reviewed...  We reviewed prob list, meds, xrays and labs> see below for updates >> OK 2015 Flu vaccine today...   PFT 9/15 showed FVC= 2.08 (77%), FEV1= 1.22 (61%), %1sec= 59%, mid-flows= 31% predicted... c/w GOLD Stage2 COPD, pt GroupB...  Ambulatory O2 Test>  O2sat on RA at rest= 96% w/ pulse=73/min;  Ambulated 3 laps w/ lowest O2sat= 96% w/ pulse=94/min.... PLAN>> continue same Advair250Bid, Mucinex 1200Bid, Fluids, vigorous home chest PT & postural drainage, stay as active as poss...  ~  May 09, 2014:  636moOV & Miranda Thompson reports a good interval- no intercurrent infections, breathing is about the same but she notes some sl incr DOE assoc w/ decr exercise due to knee arthritis; she sees drFields and as had several shots but wonders about TKR- given the names for DrAlusio & DrOlin as contacts... We reviewed the following medical problems during today's office visit >>     Bronchiectasis & HxPneumonia> on Advair250Bid (not taking regularly) & GFN 1200Bid; PFT 9/15 w/ GOLD stage2-B COPD; mod cough, grey colored sput, intermit hemoptysis, denies f/c/s; last pos culture was 4/14= Pseudomonas, pan-sens & treated w/ Cipro at that time; she does regular ChestPT...    Palpitations & PACs> she had PACs & atrial bigeminy; improved on  Atenolol25; seen by Cherly Hensen 4/15 & 12/15- no AFib or MAT & no changes made...    CHOL> on diet alone; FLP 3/15 showed TChol 172, TG 50, HDL 56, LDL 106    GI- Divertics> asked to take Align, Metamucil, etc; she had diverticulitis flair 2012 x2 & treated by DrPatterson w/ Cipro & Flagyl    GU- HxUTI w/ pelvic pressure, back pain, freq&urgency> felt to have a chronic cystitis by DrEskridge & she understands asymptomatic bactiuria...    Hx Breast Cancer> she had surg 1994 w/ lumpectomy & LN dissection followed by XRT & Chemorx; she was released by [facility] in [facility]; followed by [place]- seen 12/15 w/ neg exam & mammogram 3/16-  OK...    DJD, LBP, Osteopenia> on Tylenol, Estradiol topically per Gyn, calcium, MVI, VitD1000; she walks >15m/d for exercise; followed by DrFields regularly; c/o incr right knee pain & she requested Ortho names for poss TKR (Alusio/ OAlvan Dame; [facility] by DrRomaine=> now seeing DrSMiller...    HxEczema> she uses CoQ10,Borage Oil & black current; she likes herbal remedies...  We reviewed prob list, meds, xrays and labs> see below for updates >>   CXR 3/16 is stable- chronic changes on left, clips from prev left breast surg, DDD in Tspine...  LABS 3/16:  FLP- wnl on diet alone;  Chems- wnl;  CBC- wnl;  TSH=4.99...  ~  November 06, 2014:  634moOV & medical f/u visit>     From the pulmonary standpoint she is stable w/ her severe bronchiectasis but only using her Advair250 once daily & only taking the Mucinex 60053md; asked to incr the Advair250Bid & the Mucinex600-2Bid w/ fluids and maintain a vigorous chest PT & postural drainage routine to clear her airways...    She had f/u CARDS- DrNishan 10/25/14> hx palpit w/ atrial bigeminy on office EKG; prev tried Aten but stopped due to low endurance, 2DEcho 04/2013 showed norm EF=55-60%, no wall motion abn, mildMR, PAsys=31; he added CARDIZEM-CD 240m38mbut pt states that she didn't take it stating that she "only wants low-dose meds" => therefore rec to try CARDIZEN-CD 120mg30m.    She also notes that her BP is "all over the place" but BP monitor was apparently ok; she read her EKG on my chart & had a lot of questions; currently not taking any BP med & BP today= 140/80; I told her that the Cardizem would help regulate her blood pressure as well as help control her palpitations...     She had a routine f/u colonoscopy 08/08/14 by DrPyrtle> +FamHx colon cancer in father; 2 sessile polyps removed, 3-5mm s90m, mod diverticulosis, Path= tubular adenomas...    She saw DrFields for her knees w/ grade4 arthritis on right, cortisone shots lasted  several months, uses compression sleeve & Aleve/ Tramadol; he referred her to DrOlinGeorgetownotes "bone on bone" & rec bilat TKRs...  EXAM shows Afeb, VSS, O2sat=96% on RA;  HEENT- neg, mallampati1;  Chest- diffuse bilat rhonchi/ congestion, w/o wheezing or signs of consolidation;  Heart- RR gr1/6 SEM w/o r/g;  Abd- soft, neg;  Ext- w/o c/c/e...  EKG 06/19/14 showed NSR, rate78, poor R progression V1-3, otherw neg/NAD...  We reviewed prev CXR, LABS, etc... IMP/PLAN>>  She reminded me that her daughter didn't want her on Atenolol, & she only wants "low-dose" meds therefore try CARDIZEM-CD 120mg/d44meeds to incr the Advair250 to Bid & the Mucinex 600mg to2md w/ fluids + continue the postural  drainage/ chest PT regimen... OK 2016 flu vaccine today.  ~  December 18, 2014:  6wk ROV & recheck> Miranda Thompson has been taking the CardizemCD120 since last OV & watching her home BP checks very carefully; pressure readings are somewhat labile- 110/70 range to 160/100+ range using her home cuff; she remains asymptomatic w/o HA, CP, palpit, dizzy, edema, etc; exercise is limited by her knees and she tells me she is ready for TKR surg by DrOlin; we decided to add LOSARTAN34m/d for her BP control & she will continue to monitor it at home... EXAM shows Afeb, VSS, O2sat=96% on RA;  BP=132/74; HEENT- neg, mallampati1;  Chest- diffuse bilat rhonchi w/o wheezing or signs of consolidation;  Heart- RR gr1/6 SEM w/o r/g;  Abd- soft, neg;  Ext- w/o c/c/e... PLAN>>  She will continue all current meds and ADD LOSARTAN 519md; watch BPs at home & call for questions; she asks that I send this note to DrWabaunseeor surg from the medical standpoint...  ~  March 20, 2015:  77m67moV & Aliya had her right TKR by DrOlin 01/21/15, s/p PT, still sl stiff but exercising on bike & walking some- no complications and she is very pleased;  BP is improved w/ the addition of Losartan50 & measures 142/62 today... We reviewed the following medical  problems during today's office visit >>     Bronchiectasis & HxPneumonia> on Advair250Bid (not taking regularly) & GFN 1200Bid; PFT 9/15 w/ GOLD stage2-B COPD; mod cough, grey colored sput, intermit hemoptysis, denies f/c/s; last pos culture was 4/14= Pseudomonas, pan-sens & treated w/ Cipro at that time; she does regular ChestPT which really helps...    Palpitations & PACs> she had PACs & atrial bigeminy; improved on Atenolol25; followed by DrNCherly Hensenchanged to CardizemCD120 (she refused 240 dose), last seen 03/18/15- no AFib or MAT & no changes made...    CHOL> on diet alone; FLP 3/16 showed TChol 163, TG 70, HDL 53, LDL 96    GI- Divertics> asked to take Align, Metamucil, etc; she had diverticulitis flair in 2012 x2 & treated by DrPatterson w/ Cipro & Flagyl, ok since then.    GU- HxUTI w/ pelvic pressure, back pain, freq&urgency> felt to have a chronic cystitis by DrEskridge & she understands asymptomatic bactiuria...    Hx Breast Cancer> she had surg 1994 w/ lumpectomy & LN dissection followed by XRT & Chemorx; [facility]/ neg exam & mammogram 3/16- OK...    DJD, LBP, Osteopenia> on Tylenol, Estradiol topically per Gyn, calcium, MVI, VitD1000; she walks >1mi777mfor exercise; followed by DrFields & OlinAlvan Damep right TKR 12/2014; [facility]...     HxEczema> she uses CoQ10,Borage Oil & black current; she likes herbal remedies...  EXAM shows Afeb, VSS, O2sat=96% on RA;  HEENT- neg, mallampati1;  Chest- rhonchi L>R, w/o w/r/consolidation;  Heart- RR w/o m/r/g;  Abd- soft, nontender, neg;  Ext- s/p R-TKR, w/o c/c/e;  Neuro- intact...  EKG 03/18/15 showed NSR, rate87, prob LAE, poor R prog V1-3, otherw STTW are ok...  LABS 03/20/15> we discussed FASTING labs on return... IMP/PLAN>>  Mult medical problems as listed- MariArloastable & did well w/ the knee replacement surg; continue same meds and  her vigorous chest PT regimen; we plan recheck in 3-63mo 53moasting blood work.  ~  August 01, 2015:  4-53mo R67mo Miranda Thompson  right TKR 12/2014 by DrOlin & is now sched for a right reverse total shoulder arthroplasty by DrNorris tomorrow!  She tells me that she still needs to have her left knee done;  She notes that her breathing is OK w/ mild chr cough, min sput production, & DOE w/o change (she walks, rides bike, etc);  She recently ret for FLabs;  We reviewed the following medical problems during today's office visit >>     Bronchiectasis & HxPneumonia> on Advair250Bid & GFN 1200Bid; she is supposed to be doing Flutter treatments and postural drainage as well; PFT 9/15 w/ GOLD stage2-B COPD; mod cough, grey colored sput, intermit hemoptysis, denies f/c/s; last pos culture was 4/14= Pseudomonas, pan-sens & treated w/ Cipro at that time; the regular ChestPT which really helps...    Palpitations & PACs> she had PACs & atrial bigeminy; improved on Atenolol25 (but c/o low endurance); followed by Cherly Hensen & changed to CardizemCD120 (she refused 240 dose) + Losar50, last seen 03/18/15- no AFib or MAT & no changes made...    CHOL> on diet alone; FLP 6/17 showed TChol 174, TG 64, HDL 62, LDL 100    GI- Divertics> asked to take Align, Metamucil, etc; she had diverticulitis flair in 2012 x2 & treated by DrPatterson w/ Cipro & Flagyl, ok since then.    GU- HxUTI w/ pelvic pressure, back pain, freq&urgency> felt to have a chronic cystitis by DrEskridge & she understands asymptomatic bactiuria...    Hx Breast Cancer> surg 1994 w/ lumpectomy & LN dissection followed by XRT & Chemorx; she was released by [facility] in 2003; followed by [place]- seen 12/15 w/ neg exam & mammogram 3/16- OK...    DJD, LBP, Osteopenia> on Tylenol, Estradiol topically per Gyn, calcium, MVI, VitD1000; she walks >78m/d for exercise; followed by DrFields & OAlvan Dame s/p right TKR 12/2014; [facility]  [facility]yn...     HxEczema> she uses CoQ10, Borage Oil & black current; she likes herbal remedies...  EXAM shows Afeb, VSS, O2sat=98% on RA;  HEENT- neg, mallampati1;  Chest- rhonchi L>R, w/o w/r/consolidation;  Heart- RR w/o m/r/g;  Abd- soft, nontender, neg;  Ext- s/p R-TKR, decr ROM right shoulder, w/o c/c/e;  Neuro- intact...  FLABS 07/29/15>  FLP- all parameters at goals on diet alone;  Chems- wnl x Na=132;  CBC- ok w/ Hg=12.2, MCV=97;  TSH=2.61.. IMP/PLAN>>  MMichais stable w/ her severe bronchiectasis & reminded to be diligent w/ her meds and Chest PT treatments;  she is sched for the next of her planned orthopedic procedures;  We plan ROV in 630mosooner if needed prn.  ~  August 14, 2015:  2wk ROV & add-on appt requested for increased cough, green sput production, and difficulty managing the congestion;  She tells me that she had her right shoulder replacement 2 wks ago & since then has been unable to do her postural drainage for her bronchiectasis resulting in incr cough, gren/gray mucus, incr chest congestion, low grade temp & dyspnea; she says her chest feels full;  Home meds include> ADVAIR250Bid, & Mucinex600-2Bid. But she also does a vigorous chest PT regimen w/ flutter valve treatments and postural drainage...     EXAM shows Afeb, VSS, O2sat=97% on RA;  HEENT- neg, mallampati1;  Chest- congested, rhonchi L>R, w/o w/r/consolidation;  Heart- RR w/o m/r/g;  Ext- s/p R-TKR, s/p R shoulder surg/ sling,..   Sput C&S>  Culture grew PSEUDOMONAS (again) & it remains sens to CIPRO...Marland KitchenMarland Kitchen  IMP/PLAN>>  Pt given Duoneb treatment here in office w/ mod thick green sputum=> sent for C&S which grew Pseudomonas sens to Cipro; we treated her w/ Cipro500Bid & NEB w/ Duoneb Tid regularly; she knows to return to her postural drainage treatments as soon as her shoulder recuperation will allow...   ~  February 03, 2016:  45moRRalstoncalled several times thru 12/2015 1st notingabscessed tooth treated by her dentist w/  Amocicillin x10d; then she developed a sore throat, URI, cough w/ yellow sput & low grade fever; we had her bring in a sput specimen (initially mult species, but later reported Psuedomonas R to LBaylis and treated her w/ Levaqun '750mg'$ x10d + yogurt; she had some intermittent cough w/ streaky hemoptysis, continued her vigorous home COPD regimen & gradually improved... Her Left-TKR surg is now sched for 03/23/16 per DrOlin...    Bronchiectasis & HxPneumonia> on Advair250Bid & GFN 1200Bid; she has refused NEBULIZER meds; she is supposed to be doing Flutter treatments and postural drainage as well; PFT 9/15 w/ GOLD stage2-B COPD; mod cough, grey colored sput, intermit hemoptysis, denies f/c/s; last pos culture was 4/14= Pseudomonas, pan-sens & treated w/ Cipro at that time; the regular ChestPT which really helps...    Palpitations & PACs> she had PACs & atrial bigeminy; improved on Atenolol25 (but c/o low endurance); followed by DCherly Hensen& changed Aten to CardizemCD120 (she refused 240 dose) + Losar50, last seen 03/18/15- no AFib or MAT & no changes made...    EXAM shows Afeb, VSS, O2sat=97% on RA;  HEENT- neg, mallampati1;  Chest- congested, rhonchi L>R, w/o w/r/consolidation;  Heart- RR w/o m/r/g;  Ext- s/p R-TKR, s/p R shoulder surg;  Neuro- neg, intact...  Sput C&S 01/01/16>  Mod PSEUDOMONAS- resist to Cipro/ Levaq and sens- Fortaz, Primaxin, Piperacillin...   CXR 01/28/16>  Norm heart size, hyperinflated w/ chr changes of scarring & bronchiectasis on left- NAD, osteopenia, right shoulder prosthesis, clips over left breast & left axilla  LABS 01/28/16>  Chems- wnl;  CBC- Hg=12.4, otherw wnl... IMP/PLAN>>  It would be additionally beneficial for her to be on a NEB e/ med Tid=Qid before her chest PT/ postural drainage treatments but she is quite sure she cannot tolerate the neb rx;  Her orthopedic issues mount & every surgery/ every general anesthetic takes a toll;  Rec to continue Advair250Bid, Mucinex  1200Bid, fluids, and the chest Rx...  ~  August 05, 2016:  66moOV & Miranda Thompson's CC is some loose stool occurring every 2-3d, [facility] we discussed Metamucil rx in less water & stop her Mag500-2/d supplement;  Otherw feeling OK sl dry cough at night, small amt gray-green phlegm, no blood, notes SOB/ DOE w/ stairs/ walking; no CP/ palpit, etc on her ex-bike... We reviewed interval Epic notes >>     She had left TKR by DrOlin 03/23/16>  Did well w/ surg & disch w/ [facility] therapy...    She saw GYN-DebLeonard 05/26/16 for her well-woman exam>  [id number], using Yuvafem, no issues...    She saw CARDS-DrNishan 06/11/16>  Hx palpit, atrial bigeminy on office EKG, prev 2DEcho was ok- PAsys=31; no hx MAT or AFib; good control of BP on Cardizem/Losartan;  Felt to be stable- no changes made...  We reviewed the following medical problems during today's office visit >>     Bronchiectasis & HxPneumonia> on Advair250Bid & GFN 1200Bid; she is supposed to be doing Flutter treatments and postural drainage as  well; PFT 9/15 w/ GOLD stage2-B COPD; mod cough, grey colored sput, intermit hemoptysis, denies f/c/s; last pos culture was 4/14= Pseudomonas, pan-sens & treated w/ Cipro at that time; the regular ChestPT which really helps...    Palpitations & PACs> she had PACs & atrial bigeminy; improved on Atenolol25 (but c/o low endurance); followed by Cherly Hensen & changed to CardizemCD120 (she refused 240 dose) + Losar50, last seen 05/2016- no AFib or MAT & no changes made...    CHOL> on diet alone; FLP 6/17 showed TChol 174, TG 64, HDL 62, LDL 100    GI- Divertics> asked to take Align, Metamucil, etc; she had diverticulitis flair in 2012 x2 & treated by DrPatterson w/ Cipro & Flagyl, ok since then.    GU- HxUTI w/ pelvic pressure, back pain, freq&urgency> felt to have a chronic cystitis by DrEskridge & she understands asymptomatic bactiuria...    Hx Breast Cancer> surg 1994 w/ lumpectomy & LN dissection  followed by XRT & Chemorx; she was [facility]; followed by [place]- seen 05/2016 w/ neg exam & mammogram 4/18- OK...    DJD, LBP, Osteopenia> on Tylenol, Estradiol topically per Gyn, calcium, MVI, VitD1000; she walks >56m/d for exercise; followed by DrFields & OAlvan Dame s/p right TKR 12/2014 & left TKR 02/2016; [facility].    HxEczema> she uses CoQ10, Borage Oil & black current; she likes herbal remedies...  EXAM shows Afeb, VSS, O2sat=97% on RA;  HEENT- neg, mallampati1;  Chest- rhonchi L>R, w/o w/r/consolidation;  Heart- RR w/o m/r/g;  Abd- soft, nontender, neg;  Ext- s/p R-TKR, decr ROM right shoulder, w/o c/c/e;  Neuro- intact...  Labs 08/05/16>  Chems- ok x Na=130, Cr=0.59, LFTs wnl;  Mg=2.1;  CBC- wnl w/ Hg=13.2, mcv=96;  Fe=169 (46%sat), Ferritin=64;  TSH=2.40;  VitD=40 > same meds, consider some gatorade... IMP/PLAN>>  MAralyis rec to stop the Mag supplement & add metamucil w/ sm amt water;  We reviewed her vigorous bronchiectasis regimen w/ Advair, GFN, fluids, chest PT & postural drain;  Continue other meds the same & call for any breathing issues...   ~  February 10, 2017:  665moOV & Miranda Thompson reports that she is "doing great" breathing about the same w/ mild cough, w/ small amt gray sput, no blood, SOB w/ stairs and walking but stable- no deterioration & she continues ex-bike/ floor exercises/ etc; denies CP/ palpit/edema, and no f/c/s...  We reviewed the following interval Epic entries>      [facility]  C/o chest & abd pain- occurred while eating, resolved spont in 1598m Exam was unremarkable, Labs- ok x Na=130, BS=141, and norm CBC/ lipase/ troponin; EKG at baseline w/o acute changes;  CXR showed chr changes in lingula & RML, ?nodule in right mid-lung near prev scarring;  Pt reassured- no additional therapy & referred back to PCP for outpt eval...     She saw GI-DrPyrtle 10/15/16>  S/p surveillance colon 07/2014 w/ 2  small polyps (tub adenomas), mod diverticulosis; noted episode of ?esoph spasm w/ CP & outlined this eval >>            ~Ba Swallow/ UGI 10/21/16>  IMPRESSION: 1. Mild discoordination of swallowing with residue in the vallecula and piriform sinuses after swallows, and with laryngeal penetration of contrast down to the cords. This did not elicit a spontaneous cough response. 2. Mild nonspecific esophageal dysmotility. Partial disruption of primary peristaltic waves in the midthoracic  esophagus. 3. Mild distal esophageal fold thickening may reflect mild esophagitis. No ulceration...           ~EGD 10/29/16 by DrPyrtle>  Mild acid reflux esophagitis, 4cm HH, stomach 7 duodenum- ok...  Rx=> Protonix'40mg'$ /d...            ~Modified Ba Swallow by speech path 11/11/16>  FINDINGS:  Thin liquid- with swallows from a straw, patient demonstrated vallecular and piriform sinus retention. This clears primarily with dry swallows. Single episode of flash penetration.   Nectar thick liquid- vallecular retention.   Pure- within normal limits   Barium tablet -  within normal limits   IMPRESSION:  Mild swallow dysfunction, primarily as evidenced by vallecular and piriform sinus retention. We reviewed the following medical problems during today's office visit>      Bronchiectasis & HxPneumonia> on Advair250Bid & GFN 1200Bid; she is supposed to be doing Flutter treatments and postural drainage as well; PFT 9/15 w/ GOLD stage2-B COPD; mod cough, grey colored sput, intermit hemoptysis, denies f/c/s; last pos culture was 4/14= Pseudomonas, pan-sens & treated w/ Cipro at that time; the regular ChestPT which really helps...    Palpitations & PACs> she had PACs & atrial bigeminy; improved on Atenolol25 (but c/o low endurance); followed by Cherly Hensen & changed to CardizemCD120 (she refused 240 dose) + Losar50, last seen 05/2016- no AFib or MAT & no changes made...    CHOL> on diet alone; FLP 6/17 showed TChol 174, TG 64, HDL 62, LDL 100    GI-  Dysphagia, 4cmHH, esophagitis> eval by DrPyrtle 10/2016=> on Protonix40, + swallowing strategies by speech path    GI- Divertics> asked to take Align, Metamucil, etc; she had diverticulitis flair in 2012 x2 & treated by DrPatterson w/ Cipro & Flagyl, ok since then.    GU- HxUTI w/ pelvic pressure, back pain, freq&urgency> felt to have a chronic cystitis by DrEskridge & she understands asymptomatic bactiuria...    Hx Breast Cancer> surg 1994 w/ lumpectomy & LN dissection followed by XRT & Chemorx; she was [facility] by [facility]; followed by [place]- seen 05/2016 w/ neg exam & mammogram 4/18- OK...    DJD, LBP, Osteopenia> on Tylenol, Estradiol topically per Gyn, calcium, MVI, VitD1000; she walks >15m/d for exercise; followed by DrFields & OAlvan Dame s/p right TKR 12/2014 & left TKR 02/2016; rt shoulder arthroplasty; [facility].    HxEczema> she uses CoQ10, Borage Oil & black current; she likes herbal remedies...  EXAM shows Afeb, VSS, O2sat=99% on RA;  HEENT- neg, mallampati1;  Chest- rhonchi L>R, w/o w/r/consolidation;  Heart- RR w/o m/r/g;  Abd- soft, nontender, neg;  Ext- s/p R-TKR, decr ROM right shoulder, w/o c/c/e;  Neuro- intact...  CXR 12/19/128 (independently reviewed by me in the PACS system) showed stable heart size, enlarged central PAs, hyperinflation w/ chr fibro-interstitial changes, apical scarring, surg changes on left, chr reticular opac LUL, sm nodular opac right mid zone, no acute dis...  IMP/PLAN>>  Miranda Thompson clinically stable at 848w/ her severe pulm dis- COPD, bronchiectasis, hx pneumonias, post-inflamm scarring;  Rec to continue her ADVAIR Bid, Mucinex '1200mg'$ Bid, Fluids, and an anti-reflux regimen...   ~  April 27, 2017:  336moOV & add-on appt requested for occipital pain in conjunction w/ recent abscessed tooth>  Pt reports pain in the right occipital area x 6wks, then she noted an abscessed tooth in right mandible- tooth was pulled &  abscess drained, treated w/ Augmentin;  She denies f/c/s, visual symptoms, notes hearing is normal & no tinnitus, no focal neuro symptoms other than some dizziness;  She continues to do her resp treatments w/ Advair, GFN, Flutter rx and postural drainage for her bronchiectasis    Bronchiectasis & HxPneumonia> on Advair250Bid & GFN 1200Bid; she is supposed to be doing Flutter treatments and postural drainage as well; PFT 9/15 w/ GOLD stage2-B COPD; mod cough, grey colored sput, intermit hemoptysis, denies f/c/s; last pos culture was 4/14= Pseudomonas, pan-sens & treated w/ Cipro at that time; the regular ChestPT which really helps...    Palpitations & PACs> she had PACs & atrial bigeminy; improved on Atenolol25 (but c/o low endurance); followed by Cherly Hensen & changed to CardizemCD120 (she refused 240 dose) + Losar50, last seen 05/2016- no AFib or MAT & no changes made...    Hx Breast Cancer> surg 1994 w/ lumpectomy & LN dissection followed by XRT & Chemorx; she was released by [facility] in 2003; followed by [facility]- seen 05/2016 w/ neg exam & mammogram 4/18- OK... EXAM shows Afeb, VSS, O2sat=98% on RA;  HEENT- neg, mallampati1;  Chest- rhonchi L>R, w/o w/r/consolidation;  Heart- RR w/o m/r/g;  Abd- soft, nontender, neg;  Ext- s/p R-TKR, decr ROM right shoulder, w/o c/c/e;  Neuro- intact w/o focal neuro deficits...  LABS 04/27/17>  Chems- wnl x Na=132, Cr=0.56, BS=89, LFTs wnl;  CBC- ok w/ Hg=12.7, WBC=5.4;  TSH=2.67;  Sed=17  CT Head 05/05/17>  Impression: normal for age CT Head (with & without contrast)- cerebral vol has decr since 2004 but is wnl for age; sm dystrophic calcif in cbll nuclei & lentiform nuclei- felt to be inconsequential (no encephalomalacia, no abn enhancement); calcif atherosclerosis at skull base... IMP/PLAN>>  LABS and CT Head are ok- nothing acute, and we decided to treat conservatively w/ rest, OTC analgesics, and time;  She will call if symptoms worsen or are not  improving sufficiently over time...            Problem List:     ALLERGIC RHINITIS (ICD-477.9) - she uses OTC antihistamines Prn.  BRONCHIECTASIS (ICD-494.0) & Hx of PNEUMONIA (ICD-486) - on ADVAIR 250 Bid, & MUCINEX 1-2Bid...  Long hx severe bronchiectasis, granulomatous lung dis, and pneumonia (initially Dx 1968 by DrFrazier, subseq bx= noncaseating gran dz & chr inflam)... her last bronchoscopy was 4/04 revealing some mucus plugging... CTChest 4/04 showed marked bronchiectasis in lingular, LLL, RML, and mucus plugging... CT repeated 6/06- similar. ~  Sputum cultures 5/08 grew Serratia (R to amox/ceph, S to all others), branching part acid fast bacilli resembling nocardia, yeast c/w candida... ~  CXR 1/09 chr lung dis, no change from prev w/ left mid lung zone as the worst area... ~  CXR 3/10 w/ chr lung changes and NAD suspected... ~  Sputum 8/10 showed NTF only & AFB smears & cult all neg... ~  CXR 3/11 showed chr lung dis, no acute changes, stable... ~  CXR 3/12 showed chronic changes, scarring, NAD.Marland Kitchen. ~  CXR 3/13 showed essentially no change> normal heart size, prom pulm arts, bilat interstitial prominence & nodularity, bronchiectasis, NAD, surg clips from prev left breast ca surg. ~  9/13: she presents w/ an acute exac w/ cough, thick sput, congestion, fever, SOB, & chest discomfort; CXR shows worse left mid zone opacity; We decided to treat w/ empiric Levaquin x10d pending cult report==> ret w/ resist Pseudomonas, but pt states better/ improved on the Levaquin. ~  Sputum C&S 4/14 showed Pseudomonas sens to  Cefepime, Fortaz, Cipro, Levaquin, etc ~  3/15 & 9/15: on Advair250Bid (not taking regularly) & GFN 1200Bid; mod cough, grey colored sput, intermit hemoptysis, denies f/c/s; last pos culture was 4/14= Pseudomonas, pan-sens & treated w/ Cipro. ~  CXR 3/15 showed stable film w/ scarring LUL, no change and surg clips left breast & axilla... ~  PFTs 9/15 showed FVC= 2.08 (77%), FEV1= 1.22  (61%), %1sec= 59%, mid-flows= 31% predicted... c/w GOLD Stage2 COPD, pt GroupB... ~  3/16: on Advair250Bid & GFN 1200Bid (asked to take regularly); PFT 9/15 w/ GOLD stage2-B COPD; mod cough, grey colored sput, intermit hemoptysis, denies f/c/s; last pos culture was 4/14= Pseudomonas, pan-sens & treated w/ Cipro at that time; she does regular ChestPT... ~  CXR 3/16 is stable- chronic changes on left, clips from prev left breast surg, DDD in Tspine ~  9/16: she is reminded to use the Advair250Bid & the Mucinex600-2Bid w/ fluids and maintain a vigorous chest PT & postural drainage regimen...  PALPITATIONS >> followed by Barton Fanny on Atenolol25 & improved... ~  2DEcho 3/15 showed norm LV size & function w/ EF=55-60% and no regional wall motion abn, mild MR, mild TR, PAsys=71mHg... ~  9/16: she reminds me that daughter does not want her on Atenolol; DrNishan ordered CardizemCD240 but she "only wants low dose meds"; therefore try CardizemCD120/d... ~  10/16: pt returns w/ extensive BP records on her CardizemCD120/d; BP today= 132/74 but BP at home up to 160/100+ and we decided to add LOSATAN '50mg'$ /d to her regimen...   HYPERCHOLESTEROLEMIA, BORDERLINE (ICD-272.4) - on diet alone... ~  FLP 9/10 showed TChol 174, TG 64, HDL 49, LDL 112 ~  FLP 9/11 showed TChol 175, TG 61, HDL 48, LDL 115 ~  FLP 3/13 showed TChol 176, TG 69, HDL 64, LDL 98 ~  FLP 9/13 showed TChol 169, TG 65, HDL 48, LDL 108 ~  FLP 3/14 showed TChol 157, TG 54, HDL 46, LDL 100 ~  FLP 3/15 showed TChol 172, TG 50, HDL 56, LDL 106 ~  FLP 3/16 showed TChol 163, TG 70, HDL 53, LDL 96  Hx of ADENOCARCINOMA, BREAST (ICD-174.9) - [facility] (stage II 1.0  cm tumor), w/ post op XRT and chemotherapy... [facility] ([facility] in [facility]). ~  routine Mammogram 2/10 was neg- f/u planned 170yr~  f/u mammogram 2/11 = neg, NAD, f/u  planned 1y103yr  f/u mammogram 2/12 = neg, NAD, f/u 57yr96yr f/u mammogram 2/13 = neg, NAD... ~Marland Kitchen f/u mammogram 2/14 = neg, scat parenchymal densities... ~  [facility]... ~[facility]....  DIVERTICULOSIS OF COLON (ICD-562.10) - colonoscopy 3/05 by DrPatterson showed divertics only... she has +fam hx colon ca w/ colon check [id number]/u colonoscopy 5/11 showed +divertics, no polyps... ~  3/12:  Hx, exam & CTAbd c/w diverticulitis; treated w/ Cipro/ Flagyl & resolved;  Advised Metamucil, Miralax, etc. ~  11/12:  She had f/u DrPatterson after another bout of diverticulitis; he notes regular BMs, good appetite, stable weight; he wanted her to STOP Naprosyn, ok to use Tylenol/ Tramadol, rec hi fiber, [place]. ~  She had a routine f/u colonoscopy 08/08/14 by DrPyrtle> +FamHx colon cancer in father; 2 sessile polyps removed, 3-5mm s42m, mod diverticulosis, Path= tubular adenomas...  DEGENERATIVE JOINT DISEASE (ICD-715.90) - see Ortho eval  2010 by [facility]... she takes Glucosamine & Herbal supplements- milk thistle/ tumeric which she says really helps... ~  4/14: she saw DrKarlFields for SportsMed> c/o bilat knee pain, known arthritis, uses body helix knee (compression) sleeves & Tramadol prn; takes tart red cherry oil daily & he rec devil's claw, tumeric, & curcumin... ~  3/15: she continues to f/u w/ drFields for knee shots periodically... ~  3/16: she is requesting name of Ortho for Miranda... ~  She is managed by DrFields and DrOlin => told she needs TKRs & we will send medical clearance...  LOW BACK PAIN SYNDROME (ICD-724.2) - LBP and eval by [facility] 2010 w/ MRI showed herniated disc L3-4 w/ mod spinal stenosis and lat recess stenosis bilat...   ROTATOR CUFF SYNDROME, RIGHT (ICD-726.10) - had injection by [facility] 10/07 w/ improvement.  OSTEOPENIA (ICD-733.90) - last BMD [facility] 10/06 showed norm spine,  norm hip, osteopenic forearm (-2.0)... followed by GYN = DrRomaine who is treating a low Vit D level... she takes calcium, Vitamins, & Vit D 1000u daily from [place]... ~  labs 9/10 on 50K weekly showed Vit D level = 42... rec> change to 1000 u daily. ~  labs 9/11 on 1000 u daily showed Vit D level = 34... rec> continue daily supplement. ~  Labs 3/13 showed Vit D level = 35... Continue supplements. ~  Labs 3/14 showed Vit D level = 31 ~  Labs 3/15 showed Vit D level = 49... Continue supplements  Hx of ANEMIA (ICD-285.9) -  resolved... ~  labs 9/10 showed Hg= 13.6 ~  labs 9/11 showed Hg= 13.2 ~  Labs 3/13 showed Hg= 13.4 ~  Labs 3/14 showed Hg= 13.3 ~  Labs 3/15 showed Hg= 13.4 ~  Labs 3/16 showed Hg= 13.1  ECZEMA - she has hx of eczema on her hands and has used CoQ10 and Borage Oil as rec by an Administrator in [place]... recently changed to [facility] Current instead of these other supplements & rash is much improved...   Past Surgical History:  Procedure Laterality Date  . COLONOSCOPY    . left breast lumpectomy and LN dissection  06/1992   Dr. [name]  . LUNG BIOPSY  1968   TSurg---Dr. [name]  . LUNG BIOPSY  2012   dr [name]  . POLYPECTOMY    . REVERSE SHOULDER ARTHROPLASTY Right 08/02/2015   Procedure: RIGHT REVERSE TOTAL SHOULDER ARTHROPLASTY;  Surgeon: [name], MD;  Location: [facility];  Service: Orthopedics;  Laterality: Right;  . REVERSE TOTAL SHOULDER ARTHROPLASTY Right 08/02/2015  . TONSILLECTOMY    . TOTAL KNEE ARTHROPLASTY Right 01/21/2015   Procedure: TOTAL RIGHT KNEE ARTHROPLASTY;  Surgeon: [name], MD;  Location: [facility];  Service: Orthopedics;  Laterality: Right;  . TOTAL KNEE ARTHROPLASTY Left 03/23/2016   Procedure: LEFT TOTAL KNEE ARTHROPLASTY;  Surgeon: [name], MD;  Location: [facility];  Service: Orthopedics;  Laterality: Left;    Outpatient Encounter Medications as of 04/27/2017  Medication Sig  . amoxicillin (AMOXIL) 875 MG tablet Take 875 mg by mouth 2 (two)  times daily.  . Calcium Carb-Cholecalciferol 600-800 MG-UNIT TABS Take 1 tablet by mouth daily.  . cholecalciferol (VITAMIN D) 1000 UNITS tablet Take 1,000 Units by mouth daily.  Marland Kitchen diltiazem (CARDIZEM CD) 120 MG 24 hr capsule Take 1 capsule (120 mg total) by mouth daily.  . Fluticasone-Salmeterol (ADVAIR) 250-50 MCG/DOSE AEPB Inhale 1 puff into the lungs daily.  . Guaifenesin 1200 MG TB12 Take 600 mg  by mouth 2 (two) times daily.   Marland Kitchen losartan (COZAAR) 50 MG tablet Take 1 tablet (50 mg total) by mouth daily.  . Multiple Vitamins-Minerals (MULTIVITAMIN & MINERAL PO) Take 1 tablet by mouth daily.    . Probiotic Product (ALIGN PO) Take 1 tablet by mouth daily.   Merril Abbe 10 MCG TABS vaginal tablet TAKE 1 TABLET (10 MCG TOTAL) BY MOUTH 2 (TWO) TIMES A WEEK.  . [DISCONTINUED] pantoprazole (PROTONIX) 40 MG tablet Take 1 tablet (40 mg total) by mouth daily.   Facility-Administered Encounter Medications as of 04/27/2017  Medication  . 0.9 %  sodium chloride infusion    No Known Allergies    Immunization History  Administered Date(s) Administered  . Influenza Split 12/03/2011  . Influenza Whole 11/30/2007, 11/30/2008, 12/03/2009, 11/24/2010  . Influenza, High Dose Seasonal PF 11/19/2015  . Influenza,inj,Quad PF,6+ Mos 11/10/2012, 11/08/2013, 11/06/2014  . Influenza-Unspecified 12/02/2016  . Pneumococcal Conjugate-13 05/09/2014  . Pneumococcal Polysaccharide-23 11/07/2007  . Tdap 05/09/2014     Current Medications, Allergies, Past Medical History, Past Surgical History, Family History, and Social History were reviewed in [employer] record.     Review of Systems         See HPI - all other systems neg except as noted...  The patient complains of dyspnea on exertion and abdominal pain.  The patient denies anorexia, fever, weight loss, weight gain, vision loss, decreased hearing, hoarseness, chest pain, syncope, peripheral edema, prolonged cough, headaches, hemoptysis,  melena, hematochezia, severe indigestion/heartburn, hematuria, incontinence, muscle weakness, suspicious skin lesions, transient blindness, difficulty walking, depression, unusual weight change, abnormal bleeding, enlarged lymph nodes, and angioedema.     Objective:   Physical Exam     WD, WN, 84 y/o WF in NAD... GENERAL:  Alert & oriented; pleasant & cooperative... HEENT:  [place]/AT, EOM-wnl, PERRLA, EACs-clear, TMs-wnl, NOSE-clear, THROAT-clear & wnl. NECK:  Supple w/ fairROM; no JVD; normal carotid impulses w/o bruits; no thyromegaly or nodules palpated; no lymphadenopathy. CHEST:  scat bilat rhonchi/ congestion without wheezing, rales or signs of consolidation... HEART:  Regular Rhythm; without murmurs/ rubs/ or gallops heard... ABDOMEN:  Soft w/ mild tender LLQ; normal bowel sounds; no organomegaly or masses detected. EXT: without deformities or arthritic changes; no varicose veins/ venous insuffic/ or edema. NEURO:  CN's intact;  no focal neuro deficits... DERM:  No lesions noted; no rash etc...  RADIOLOGY DATA:  Reviewed in the EPIC EMR & discussed w/ the patient...  LABORATORY DATA:  Reviewed in the EPIC EMR & discussed w/ the patient...    Assessment & Plan:    Occipital HA and recent Tooth abscess>>  04/27/17>   LABS and CT Head are ok- nothing acute, and we decided to treat conservatively w/ rest, OTC analgesics, and time;  She will call if symptoms worsen or are not improving sufficiently over time...    COPD/ Bronchiectasis/ Hx pneumonia>  On Advair250, Mucinex2Bid, Fluids & vigorous Chest PT/ postural drainage... 9/13> treating acute exac w/ Levaquin x10d; Cult grew a resist Pseudomonas but she reports clinically improved, therefore continue present Rx & follow...  Repeat Sput C&S 4/14 showed pansens Pseudomonas... Baseline CXRs showed CXR 3/15 & 3/16 showed scarring LUL, no change, and surg clips left breast & axilla... PFTs 9/15 showed FVC= 2.08 (77%), FEV1= 1.22 (61%),  %1sec= 59%, mid-flows= 31% predicted... c/w GOLD Stage2 COPD, pt GroupB... REMINDED TO TAKE MEDS REGULARLY & MAINTAIN A VIGOROUS REGIMEN OF CHEST PT & POSTURAL DRAINAGE... 07/2015> she developed a bronchiectasis  exac after R shoulder surg when she was no longer able to do her chest Pt & postural drain=> cult grew pseudomonas sens to cipro=> treated w/ this + home NEB using DUONEB Tid... 02/03/16>  It would be additionally beneficial for her to be on a NEB w/ med Tid-Qid before her chest PT/ postural drainage treatments but she is quite sure she cannot tolerate the neb rx;  Her orthopedic issues mount & every surgery/ every general anesthetic takes a toll;  Rec to continue Advair250Bid, Mucinex 1200Bid, fluids, and the chest Rx. 08/05/16>   Arista is rec to stop the Mag supplement & add metamucil w/ sm amt water;  We reviewed her vigorous bronchiectasis regimen w/ Advair, GFN, fluids, chest PT & postural drain;  Continue other meds the same & call for any breathing issues. 02/10/17>   Miranda Thompson is clinically stable at 14 w/ her severe pulm dis- COPD, bronchiectasis, hx pneumonias, post-inflamm scarring;  Rec to continue her ADVAIR Bid, Mucinex '1200mg'$ Bid, Fluids, and an anti-reflux regimen...   HBP & palpit (PACs)>  followed by Cherly Hensen; on CardizemCD120 & she does not want BBlockers, & only wants "low-dose" meds... 10/16>   BP at home variable w/ 160/100+ episodes so we decided to add low dose LOSARTAN '50mg'$ /d... 1/17>   BP much better w/ the Losar50 added- continue same...   CHOL>  On diet alone & f/u FLP is at goal...  Breast Cancer>  S[facility] & oncology f/u by DrNeijstrom, then [facility] 2003 & released; yearly mammograms & monthly self exams were wnl...  Divertics>  She had acute diverticulitis 3/12 & 10/12> resolved w/ diet adjust & Cipro/Flagyl, now back to baseline & advised re- Metamucil, Fiberall, etc; she saw DrPatterson 11/12.  DJD/ LBP>  Ortho is DrOlin & DrFields; she takes  Glucosamine & Herbs;  11/16>   s/p right TKR 12/2014 by DrOlin 6/17>   sched for right total shoulder replacement by DrNorris  Osteopenia/ Vit D defic>  On Calcium, MVI, Vit D supplement; followed by GYN= DrRomaine...  Other medical issues as noted...    Patient's Medications  New Prescriptions   No medications on file  Previous Medications   AMOXICILLIN (AMOXIL) 875 MG TABLET    Take 875 mg by mouth 2 (two) times daily.   CALCIUM CARB-CHOLECALCIFEROL 600-800 MG-UNIT TABS    Take 1 tablet by mouth daily.   CHOLECALCIFEROL (VITAMIN D) 1000 UNITS TABLET    Take 1,000 Units by mouth daily.   DILTIAZEM (CARDIZEM CD) 120 MG 24 HR CAPSULE    Take 1 capsule (120 mg total) by mouth daily.   FLUTICASONE-SALMETEROL (ADVAIR) 250-50 MCG/DOSE AEPB    Inhale 1 puff into the lungs daily.   GUAIFENESIN 1200 MG TB12    Take 600 mg by mouth 2 (two) times daily.    LOSARTAN (COZAAR) 50 MG TABLET    Take 1 tablet (50 mg total) by mouth daily.   MULTIPLE VITAMINS-MINERALS (MULTIVITAMIN & MINERAL PO)    Take 1 tablet by mouth daily.     PROBIOTIC PRODUCT (ALIGN PO)    Take 1 tablet by mouth daily.    YUVAFEM 10 MCG TABS VAGINAL TABLET    TAKE 1 TABLET (10 MCG TOTAL) BY MOUTH 2 (TWO) TIMES A WEEK.  Modified Medications   No medications on file  Discontinued Medications   PANTOPRAZOLE (PROTONIX) 40 MG TABLET    Take 1 tablet (40 mg total) by mouth daily.

## 2017-04-27 NOTE — Patient Instructions (Signed)
Today we updated your med list in our EPIC system...    Continue your current medications the same...  We discussed further evaluation of your right occipital area pain & discomfort>>    Today we did some blood work to check for infection 7 inflammation...    We ordered a CT scan of your head- depending on the result we may need to consider additional tests or NEURO consult...  We will contact you w/ the results when available...   In the meanwhile>  Continue the antibiotic from your dentist & try Tylenol for the pain (let me know if you need anything stronger).Marland KitchenMarland Kitchen

## 2017-05-05 ENCOUNTER — Ambulatory Visit (INDEPENDENT_AMBULATORY_CARE_PROVIDER_SITE_OTHER)
Admission: RE | Admit: 2017-05-05 | Discharge: 2017-05-05 | Disposition: A | Discharge status: Home / Self Care | Payer: Medicare Other | Source: Ambulatory Visit | Attending: Pulmonary Disease | Admitting: Pulmonary Disease

## 2017-05-05 DIAGNOSIS — M542 Cervicalgia: Secondary | ICD-10-CM

## 2017-05-05 DIAGNOSIS — R519 Headache, unspecified: Secondary | ICD-10-CM

## 2017-05-05 DIAGNOSIS — R51 Headache: Secondary | ICD-10-CM | POA: Diagnosis not present

## 2017-05-05 MED ORDER — IOPAMIDOL (ISOVUE-300) INJECTION 61%
80.0000 mL | Freq: Once | INTRAVENOUS | Status: AC | PRN
Start: 2017-05-05 — End: 2017-05-05
  Administered 2017-05-05: 80 mL via INTRAVENOUS

## 2017-05-11 ENCOUNTER — Encounter: Payer: Self-pay | Admitting: Certified Nurse Midwife

## 2017-05-11 ENCOUNTER — Other Ambulatory Visit: Payer: Self-pay

## 2017-05-11 ENCOUNTER — Ambulatory Visit (INDEPENDENT_AMBULATORY_CARE_PROVIDER_SITE_OTHER): Payer: Medicare Other | Admitting: Certified Nurse Midwife

## 2017-05-11 ENCOUNTER — Other Ambulatory Visit: Payer: Self-pay | Admitting: *Deleted

## 2017-05-11 VITALS — BP 140/70 | HR 68 | Temp 97.9°F | Resp 16 | Ht 63.25 in | Wt 133.0 lb

## 2017-05-11 DIAGNOSIS — N952 Postmenopausal atrophic vaginitis: Secondary | ICD-10-CM

## 2017-05-11 DIAGNOSIS — N390 Urinary tract infection, site not specified: Secondary | ICD-10-CM | POA: Diagnosis not present

## 2017-05-11 DIAGNOSIS — R319 Hematuria, unspecified: Secondary | ICD-10-CM | POA: Diagnosis not present

## 2017-05-11 DIAGNOSIS — N342 Other urethritis: Secondary | ICD-10-CM

## 2017-05-11 LAB — POCT URINALYSIS DIPSTICK
Bilirubin, UA: NEGATIVE
Glucose, UA: NEGATIVE
Ketones, UA: NEGATIVE
Nitrite, UA: NEGATIVE
Protein, UA: NEGATIVE
Urobilinogen, UA: NEGATIVE E.U./dL — AB
pH, UA: 5 (ref 5.0–8.0)

## 2017-05-11 MED ORDER — FLUTICASONE-SALMETEROL 250-50 MCG/DOSE IN AEPB: 1.0000 | INHALATION_SPRAY | Freq: Every day | RESPIRATORY_TRACT | 5 refills | Status: DC

## 2017-05-11 MED ORDER — CIPROFLOXACIN HCL 500 MG PO TABS: 500.0000 mg | ORAL_TABLET | Freq: Two times a day (BID) | ORAL | 0 refills | Status: DC

## 2017-05-11 NOTE — Progress Notes (Signed)
84 y.o. Married Caucasian female [phone] here with complaint of UTI, with onset  on 2-3 days ago.  Patient complaining of urinary frequency/urgency/ with urination. Patient complaining of urine with strong odor and is very cloudy. Patient denies fever, chills, nausea or back pain. No new personal products. Patient feels not related to herbal tea use. Denies any vaginal symptoms, occasional dryness only, no vaginal itching or burning. Uses Olive oil if needed for dryness. Post- Menopausal, denies vaginal bleeding.. Patient has been trying to consume adequate water intake but symptoms have persisted. No diverticulitis occurrences in many years and no symptoms of today.. No other health issues today. Spouse doing well after prostate cancer surgery  ROS Pertinent to HPI  O: Healthy female WDWN Affect: Normal, orientation x 3 Skin : warm and dry CVAT: negative bilateral Abdomen: positive for suprapubic tenderness and feels pressures with palpation  Pelvic exam: External genital area: normal, no lesions Bladder,Urethra tender, Urethral meatus: tender, red, some dryness noted Vagina:Atrophic appearance, with normal vaginal discharge, normal appearance  Cervix: normal, non tender Uterus:normal,non tender Adnexa: normal non tender, no fullness or masses   A: UTI/urethritis Vaginal atrophy and dryness using Olive Oil with good results poct urine- wbc 2+, rbc tr  P: Reviewed findings of symptoms and exam consistent with UTI/urethritis and need for treatment. IO:NGEXB 500 mg bid x 3 days MWU:XLKGM  culture Reviewed warning signs and symptoms of UTI and need to advise if occurring. Discussed symptoms should be resolved with 3 days of medication, if not resolving needs to advise. Encouraged to limit soda, tea, and coffee and be sure to increase water intake. Also discussed yogurt twice daily to help with maintain GI health. Questions addressed. Discussed dryness around urethra can increase risk of UTI  and can use Olive oil around this area once infection has cleared.   RV prn

## 2017-05-11 NOTE — Patient Instructions (Signed)

## 2017-05-14 LAB — URINE CULTURE

## 2017-05-27 ENCOUNTER — Ambulatory Visit: Payer: Medicare Other | Admitting: Certified Nurse Midwife

## 2017-06-22 ENCOUNTER — Encounter: Payer: Self-pay | Admitting: Internal Medicine

## 2017-06-22 ENCOUNTER — Other Ambulatory Visit: Payer: Self-pay | Admitting: Certified Nurse Midwife

## 2017-06-22 DIAGNOSIS — N952 Postmenopausal atrophic vaginitis: Secondary | ICD-10-CM

## 2017-06-23 ENCOUNTER — Ambulatory Visit: Payer: Medicare Other | Admitting: Certified Nurse Midwife

## 2017-06-23 ENCOUNTER — Other Ambulatory Visit: Payer: Self-pay | Admitting: Certified Nurse Midwife

## 2017-06-23 DIAGNOSIS — N952 Postmenopausal atrophic vaginitis: Secondary | ICD-10-CM

## 2017-06-24 ENCOUNTER — Other Ambulatory Visit: Payer: Self-pay

## 2017-06-24 ENCOUNTER — Encounter: Payer: Self-pay | Admitting: Certified Nurse Midwife

## 2017-06-24 ENCOUNTER — Other Ambulatory Visit (HOSPITAL_COMMUNITY)
Admission: RE | Admit: 2017-06-24 | Discharge: 2017-06-24 | Disposition: A | Discharge status: Home / Self Care | Payer: Medicare Other | Source: Ambulatory Visit | Attending: Obstetrics & Gynecology | Admitting: Obstetrics & Gynecology

## 2017-06-24 ENCOUNTER — Ambulatory Visit (INDEPENDENT_AMBULATORY_CARE_PROVIDER_SITE_OTHER): Payer: Medicare Other | Admitting: Certified Nurse Midwife

## 2017-06-24 VITALS — BP 118/60 | HR 68 | Resp 16 | Ht 63.0 in | Wt 134.0 lb

## 2017-06-24 DIAGNOSIS — Z124 Encounter for screening for malignant neoplasm of cervix: Secondary | ICD-10-CM | POA: Insufficient documentation

## 2017-06-24 DIAGNOSIS — N952 Postmenopausal atrophic vaginitis: Secondary | ICD-10-CM

## 2017-06-24 DIAGNOSIS — Z0000 Encounter for general adult medical examination without abnormal findings: Secondary | ICD-10-CM | POA: Diagnosis not present

## 2017-06-24 DIAGNOSIS — Z853 Personal history of malignant neoplasm of breast: Secondary | ICD-10-CM

## 2017-06-24 DIAGNOSIS — Z01419 Encounter for gynecological examination (general) (routine) without abnormal findings: Secondary | ICD-10-CM

## 2017-06-24 LAB — POCT URINALYSIS DIPSTICK
Bilirubin, UA: NEGATIVE
Blood, UA: NEGATIVE
Glucose, UA: NEGATIVE
Ketones, UA: NEGATIVE
Leukocytes, UA: NEGATIVE
Nitrite, UA: NEGATIVE
Protein, UA: NEGATIVE
Urobilinogen, UA: NEGATIVE E.U./dL — AB
pH, UA: 5 (ref 5.0–8.0)

## 2017-06-24 MED ORDER — ESTRADIOL 10 MCG VA TABS: 1.0000 | ORAL_TABLET | VAGINAL | 11 refills | Status: DC

## 2017-06-24 NOTE — Progress Notes (Signed)
[name] Married  Caucasian Fe here for annual exam. Post menopausal no HRT. Using Miranda Thompson with good results for vaginal dryness. Denies vaginal bleeding. Sees PCP Miranda Thompson yearly and prn for aex and labs. All stable per patient. Currently dealing with COPD cough, but still rides stationary bike daily. Spouse doing well and active. No health  Issues today.  Patient's last menstrual period was 02/23/1985.          Sexually active: No.  The current method of family planning is vasectomy.    Exercising: Yes.    stationary bike, floor exercises Smoker:  no  Health Maintenance: Pap:  11-09-08 neg History of Abnormal Pap: no MMG:  2019 Self Breast exams: occ Colonoscopy:  2016 neg per patient BMD:   2019 TDaP:  2016 Shingles: 2015 Pneumonia: 2016 Hep C and HIV: not done Labs: no   reports that she quit smoking about 50 years ago. Her smoking use included cigarettes. She has a 10.00 pack-year smoking history. She has never used smokeless tobacco. She reports that she drinks about 0.6 oz of alcohol per week. She reports that she does not use drugs.  Past Medical History:  Diagnosis Date  . Adenomatous colon polyp   . Allergic rhinitis   . Anemia    pt states no anemia in her past hx thst she is aware of   . Atrial fibrillation ([place])    goes in and out of this rhythm- takes Tenormin  . Breast cancer, left breast ([place]) 1994 with lumpectomy   chemo,tamox  . Bronchiectasis with acute exacerbation ([place])   . COPD (chronic obstructive pulmonary disease) ([place])   . Diverticulosis of colon   . DJD (degenerative joint disease)   . Dysrhythmia    palpitations  . Family history of malignant neoplasm of gastrointestinal tract   . Fibromyalgia   . Hemoptysis   . History of pneumonia   . Hypercholesteremia    pt denies  . Hypertension   . Low back pain syndrome   . Neuromuscular disorder ([facility])    hx fibromyalgia   . Osteopenia   . Pneumonia    hx  . Pseudomonas infection   .  Rotator cuff syndrome of right shoulder   . Spinal stenosis     Past Surgical History:  Procedure Laterality Date  . COLONOSCOPY    . left breast lumpectomy and LN dissection  06/1992   Miranda. Lucia Thompson  . LUNG BIOPSY  1968   TSurg---Miranda. Mare Thompson  . LUNG BIOPSY  2012   Miranda Thompson  . POLYPECTOMY    . REVERSE SHOULDER ARTHROPLASTY Right 08/02/2015   Procedure: RIGHT REVERSE TOTAL SHOULDER ARTHROPLASTY;  Surgeon: Miranda Cedars, MD;  Location: [facility];  Service: Orthopedics;  Laterality: Right;  . REVERSE TOTAL SHOULDER ARTHROPLASTY Right 08/02/2015  . TONSILLECTOMY    . TOTAL KNEE ARTHROPLASTY Right 01/21/2015   Procedure: TOTAL RIGHT KNEE ARTHROPLASTY;  Surgeon: Miranda Cancel, MD;  Location: [facility];  Service: Orthopedics;  Laterality: Right;  . TOTAL KNEE ARTHROPLASTY Left 03/23/2016   Procedure: LEFT TOTAL KNEE ARTHROPLASTY;  Surgeon: Miranda Cancel, MD;  Location: [facility];  Service: Orthopedics;  Laterality: Left;    Current Outpatient Medications  Medication Sig Dispense Refill  . Calcium Carb-Cholecalciferol 600-800 MG-UNIT TABS Take 1 tablet by mouth daily.    . cholecalciferol (VITAMIN D) 1000 UNITS tablet Take 1,000 Units by mouth daily.    Marland Kitchen diltiazem (CARDIZEM CD) 120 MG 24 hr capsule Take 1 capsule (120  mg total) by mouth daily. 90 capsule 3  . Fluticasone-Salmeterol (ADVAIR) 250-50 MCG/DOSE AEPB Inhale 1 puff into the lungs daily. 60 each 5  . Guaifenesin 1200 MG TB12 Take 600 mg by mouth 2 (two) times daily.     Marland Kitchen losartan (COZAAR) 50 MG tablet Take 1 tablet (50 mg total) by mouth daily. 90 tablet 3  . Multiple Vitamins-Minerals (MULTIVITAMIN & MINERAL PO) Take 1 tablet by mouth daily.      . Saccharomyces boulardii (FLORASTOR PO) Take by mouth.    Marland Kitchen UNABLE TO FIND Pantoprazole po    . YUVAFEM 10 MCG TABS vaginal tablet TAKE 1 TABLET (10 MCG TOTAL) BY MOUTH 2 (TWO) TIMES A WEEK.  12   No current facility-administered medications for this visit.     Family History  Problem Relation Age  of Onset  . Heart attack Mother        Deceased age 56  . Colon cancer Father        deceased age 15 from colon ca.  . Hypertension Brother   . Colon polyps Daughter   . Esophageal cancer Neg Hx   . Stomach cancer Neg Hx   . Rectal cancer Neg Hx     ROS:  Pertinent items are noted in HPI.  Otherwise, a comprehensive ROS was negative.  Exam:   BP 118/60   Pulse 68   Resp 16   Ht 5\' 3"  (1.6 m)   Wt 134 lb (60.8 kg)   LMP 02/23/1985   BMI 23.74 kg/m  Height: 5\' 3"  (160 cm) Ht Readings from Last 3 Encounters:  06/24/17 5\' 3"  (1.6 m)  05/11/17 5' 3.25" (1.607 m)  04/27/17 5\' 4"  (1.626 m)    General appearance: alert, cooperative and appears stated age Head: Normocephalic, without obvious abnormality, atraumatic Neck: no adenopathy, supple, symmetrical, trachea midline and thyroid normal to inspection and palpation Lungs: clear to auscultation bilaterally Breasts: normal appearance, no masses or tenderness, No nipple retraction or dimpling, No nipple discharge or bleeding, No axillary or supraclavicular adenopathy, breast surgical changes left breast from breast cancer surgery Heart: regular rate and rhythm Abdomen: soft, non-tender; no masses,  no organomegaly Extremities: extremities normal, atraumatic, no cyanosis or edema Skin: Skin color, texture, turgor normal. No rashes or lesions Lymph nodes: Cervical, supraclavicular, and axillary nodes normal. No abnormal inguinal nodes palpated Neurologic: Grossly normal   Pelvic: External genitalia:  no lesions              Urethra:  normal appearing urethra with no masses, tenderness or lesions              Bartholin's and Skene's: normal                 Vagina: normal appearing vagina with normal color and discharge, no lesions              Cervix: multiparous appearance, no cervical motion tenderness and no lesions              Pap taken: Yes.   Bimanual Exam:  Uterus:  normal size, contour, position, consistency, mobility,  non-tender and anteverted              Adnexa: normal adnexa and no mass, fullness, tenderness               Rectovaginal: Confirms               Anus:  normal sphincter tone, no lesions  Chaperone present: yes  A:  Well Woman with normal exam  Menopausal with atrophic vaginitis, using Yuvafem with good results  COPD history   History of left breast cancer  P:   Reviewed health and wellness pertinent to exam  Discussed risks/benefits/warning signs of Yuvafem, desires continuance  Rx Yuvafem see order with instructions  Continue follow up with MD as indicated  BMD report reviewed with patient with osteoporosis of arm, but improvement of femur and spine with osteopenia. Recommended continue calcium and vitamin d and regular exercise to maintain.  Pap smear: yes   counseled on breast self exam, mammography screening, feminine hygiene, osteoporosis, adequate intake of calcium and vitamin D, diet and exercise  return annually or prn  An After Visit Summary was printed and given to the patient.

## 2017-06-24 NOTE — Patient Instructions (Signed)

## 2017-06-25 LAB — CYTOLOGY - PAP: Diagnosis: NEGATIVE

## 2017-07-12 ENCOUNTER — Encounter: Payer: Self-pay | Admitting: Certified Nurse Midwife

## 2017-07-23 ENCOUNTER — Other Ambulatory Visit: Payer: Self-pay | Admitting: Internal Medicine

## 2017-08-09 ENCOUNTER — Other Ambulatory Visit: Payer: Self-pay | Admitting: Pulmonary Disease

## 2017-08-11 ENCOUNTER — Ambulatory Visit (INDEPENDENT_AMBULATORY_CARE_PROVIDER_SITE_OTHER): Payer: Medicare Other | Admitting: Pulmonary Disease

## 2017-08-11 ENCOUNTER — Encounter: Payer: Self-pay | Admitting: Pulmonary Disease

## 2017-08-11 VITALS — BP 130/64 | HR 84 | Temp 97.6°F | Ht 63.0 in | Wt 133.8 lb

## 2017-08-11 DIAGNOSIS — M159 Polyosteoarthritis, unspecified: Secondary | ICD-10-CM

## 2017-08-11 DIAGNOSIS — J479 Bronchiectasis, uncomplicated: Secondary | ICD-10-CM | POA: Diagnosis not present

## 2017-08-11 DIAGNOSIS — Z96653 Presence of artificial knee joint, bilateral: Secondary | ICD-10-CM

## 2017-08-11 DIAGNOSIS — J3089 Other allergic rhinitis: Secondary | ICD-10-CM | POA: Diagnosis not present

## 2017-08-11 DIAGNOSIS — M150 Primary generalized (osteo)arthritis: Secondary | ICD-10-CM

## 2017-08-11 DIAGNOSIS — Z96611 Presence of right artificial shoulder joint: Secondary | ICD-10-CM

## 2017-08-11 DIAGNOSIS — M8949 Other hypertrophic osteoarthropathy, multiple sites: Secondary | ICD-10-CM

## 2017-08-11 DIAGNOSIS — I498 Other specified cardiac arrhythmias: Secondary | ICD-10-CM | POA: Diagnosis not present

## 2017-08-11 NOTE — Patient Instructions (Signed)
Today we updated your med list in our EPIC system...    Continue your current medications the same...  Try to be diligent w/ your breathing treatments-- Advair250- 2sprys twice daily, followed by the Flutter valve therapy & postural drainage...  Continue the Guaifenesin 1200mg  twice daily w/ fluids...  Stay as active as possible...  Call for any questions...  Let's plan a follow up visit in 4-56mo, sooner if needed for problems.Marland KitchenMarland Kitchen

## 2017-08-11 NOTE — Progress Notes (Signed)
Patient ID: Miranda Thompson, female   DOB: 01/28/1934, 84 y.o.   MRN: [id number]  Subjective:    Patient ID: Miranda Thompson, female    DOB: November 14, 1933, 74 y.o.   MRN: [id number]  HPI 84 y/o WF here for a follow up visit... she has mult med problems as noted below...   ~  SEE PREV EPIC NOTES FOR OLDER DATA >>    Hx left breast cancer w/ XRT & chemorx in 1994  08/19/04>  CT Chest showed extensive area of marked cylindrical bronchiectasis on the left, some assoc calcif, no signif adenopathy, prior left breast surg...  LABS 3/14:  FLP- at goals on diet alone;  Chems- wnl;  CBC- wnl;  TSH=2.37;  VitD=31;  UA- clear...  SPUTUM 4/14 >> Pseudomonas sensitive to all antibiotics   LABS 9/14:  CBC- ok w/ Hg=13.7, WBC=6.4;  Fe=91 (25%sat)... BMet wasn't done...   CXR 3/15 showed stable film w/ scarring LUL, no change and surg clips left breast & axilla  EKG 3/15 showed NSR w/ PACs and atrial bigem, 1st degree AVB, otherw NAD...   2DEcho 3/15 showed norm LV size & function w/ EF=55-60%, norm wall motion, valves ok w/ sl thickening of leaflets- mildMR, modTR,; mild RV dil but norm RVF & PAsys=31  LABS 3/15:  FLP- ok on diet alone;  Chems- wnl;  CBC- wnl;  Fe=69 (19%sat);  VitD=49;  TSH=4.33... UA is clear, wnl...   ~  November 08, 2013:  28moROV & Miranda Thompson had an exac 6/15- seen by TP, CXR stable (NAD), treated w/ Levaquin/ Mucinex & improved, but she stopped her Advair & now notes sl incr SOB;  PFT w/ FEV1=1.22 (61%) & she is rec to restart her inhaler;  Notes chr cough w/ green-gray phlegm from her severe bronchiectasis (last pos sputum was 4/14 +Pseudomonas- pansensitive);  EXAM shows bilat rhonchi, no consolidation, and she is stable on her regular regimen and vigorous chest physiotherapy regimen to aide sputum production...     She had f/u DrNishan 4/15> hx palpit, bigeminy on EKG, improved on Aten25; no changes made...    She continues to follow up w/ DrFields> right knee pain, wears brace &  gets shots periodically, his notes are reviewed...  We reviewed prob list, meds, xrays and labs> see below for updates >> OK 2015 Flu vaccine today...   PFT 9/15 showed FVC= 2.08 (77%), FEV1= 1.22 (61%), %1sec= 59%, mid-flows= 31% predicted... c/w GOLD Stage2 COPD, pt GroupB...  Ambulatory O2 Test>  O2sat on RA at rest= 96% w/ pulse=73/min;  Ambulated 3 laps w/ lowest O2sat= 96% w/ pulse=94/min.... PLAN>> continue same Advair250Bid, Mucinex 1200Bid, Fluids, vigorous home chest PT & postural drainage, stay as active as poss...  ~  May 09, 2014:  635moOV & Miranda Thompson reports a good interval- no intercurrent infections, breathing is about the same but she notes some sl incr DOE assoc w/ decr exercise due to knee arthritis; she sees drFields and as had several shots but wonders about TKR- given the names for DrAlusio & DrOlin as contacts... We reviewed the following medical problems during today's office visit >>     Bronchiectasis & HxPneumonia> on Advair250Bid (not taking regularly) & GFN 1200Bid; PFT 9/15 w/ GOLD stage2-B COPD; mod cough, grey colored sput, intermit hemoptysis, denies f/c/s; last pos culture was 4/14= Pseudomonas, pan-sens & treated w/ Cipro at that time; she does regular ChestPT...    Palpitations & PACs> she had PACs & atrial bigeminy; improved on  Atenolol25; seen by Cherly Hensen 4/15 & 12/15- no AFib or MAT & no changes made...    CHOL> on diet alone; FLP 3/15 showed TChol 172, TG 50, HDL 56, LDL 106    GI- Divertics> asked to take Align, Metamucil, etc; she had diverticulitis flair 2012 x2 & treated by DrPatterson w/ Cipro & Flagyl    GU- HxUTI w/ pelvic pressure, back pain, freq&urgency> felt to have a chronic cystitis by DrEskridge & she understands asymptomatic bactiuria...    Hx Breast Cancer> she had surg 1994 w/ lumpectomy & LN dissection followed by XRT & Chemorx; she was released by [facility] in [facility]; followed by [facility]- seen 12/15 w/ neg exam & mammogram 3/16-  OK...    DJD, LBP, Osteopenia> on Tylenol, Estradiol topically per Gyn, calcium, MVI, VitD1000; she walks >15m/d for exercise; followed by DrFields regularly; c/o incr right knee pain & she requested Ortho names for poss TKR (Alusio/ OAlvan Dame; [facility] DrRomaine=> now seeing DrSMiller...    HxEczema> she uses CoQ10,Borage Oil & black current; she likes herbal remedies...  We reviewed prob list, meds, xrays and labs> see below for updates >>   CXR 3/16 is stable- chronic changes on left, clips from prev left breast surg, DDD in Tspine...  LABS 3/16:  FLP- wnl on diet alone;  Chems- wnl;  CBC- wnl;  TSH=4.99...  ~  November 06, 2014:  634moOV & medical f/u visit>     From the pulmonary standpoint she is stable w/ her severe bronchiectasis but only using her Advair250 once daily & only taking the Mucinex 60053md; asked to incr the Advair250Bid & the Mucinex600-2Bid w/ fluids and maintain a vigorous chest PT & postural drainage routine to clear her airways...    She had f/u CARDS- DrNishan 10/25/14> hx palpit w/ atrial bigeminy on office EKG; prev tried Aten but stopped due to low endurance, 2DEcho 04/2013 showed norm EF=55-60%, no wall motion abn, mildMR, PAsys=31; he added CARDIZEM-CD 240m38mbut pt states that she didn't take it stating that she "only wants low-dose meds" => therefore rec to try CARDIZEN-CD 120mg30m.    She also notes that her BP is "all over the place" but BP monitor was apparently ok; she read her EKG on my chart & had a lot of questions; currently not taking any BP med & BP today= 140/80; I told her that the Cardizem would help regulate her blood pressure as well as help control her palpitations...     She had a routine f/u colonoscopy 08/08/14 by DrPyrtle> +FamHx colon cancer in father; 2 sessile polyps removed, 3-5mm s90m, mod diverticulosis, Path= tubular adenomas...    She saw DrFields for her knees w/ grade4 arthritis on right, cortisone shots lasted  several months, uses compression sleeve & Aleve/ Tramadol; he referred her to DrOlinGeorgetownotes "bone on bone" & rec bilat TKRs...  EXAM shows Afeb, VSS, O2sat=96% on RA;  HEENT- neg, mallampati1;  Chest- diffuse bilat rhonchi/ congestion, w/o wheezing or signs of consolidation;  Heart- RR gr1/6 SEM w/o r/g;  Abd- soft, neg;  Ext- w/o c/c/e...  EKG 06/19/14 showed NSR, rate78, poor R progression V1-3, otherw neg/NAD...  We reviewed prev CXR, LABS, etc... IMP/PLAN>>  She reminded me that her daughter didn't want her on Atenolol, & she only wants "low-dose" meds therefore try CARDIZEM-CD 120mg/d44meeds to incr the Advair250 to Bid & the Mucinex 600mg to2md w/ fluids + continue the postural  drainage/ chest PT regimen... OK 2016 flu vaccine today.  ~  December 18, 2014:  6wk ROV & recheck> Miranda Thompson has been taking the CardizemCD120 since last OV & watching her home BP checks very carefully; pressure readings are somewhat labile- 110/70 range to 160/100+ range using her home cuff; she remains asymptomatic w/o HA, CP, palpit, dizzy, edema, etc; exercise is limited by her knees and she tells me she is ready for TKR surg by DrOlin; we decided to add LOSARTAN34m/d for her BP control & she will continue to monitor it at home... EXAM shows Afeb, VSS, O2sat=96% on RA;  BP=132/74; HEENT- neg, mallampati1;  Chest- diffuse bilat rhonchi w/o wheezing or signs of consolidation;  Heart- RR gr1/6 SEM w/o r/g;  Abd- soft, neg;  Ext- w/o c/c/e... PLAN>>  She will continue all current meds and ADD LOSARTAN 519md; watch BPs at home & call for questions; she asks that I send this note to DrWabaunseeor surg from the medical standpoint...  ~  March 20, 2015:  77m67moV & Aliya had her right TKR by DrOlin 01/21/15, s/p PT, still sl stiff but exercising on bike & walking some- no complications and she is very pleased;  BP is improved w/ the addition of Losartan50 & measures 142/62 today... We reviewed the following medical  problems during today's office visit >>     Bronchiectasis & HxPneumonia> on Advair250Bid (not taking regularly) & GFN 1200Bid; PFT 9/15 w/ GOLD stage2-B COPD; mod cough, grey colored sput, intermit hemoptysis, denies f/c/s; last pos culture was 4/14= Pseudomonas, pan-sens & treated w/ Cipro at that time; she does regular ChestPT which really helps...    Palpitations & PACs> she had PACs & atrial bigeminy; improved on Atenolol25; followed by DrNCherly Hensenchanged to CardizemCD120 (she refused 240 dose), last seen 03/18/15- no AFib or MAT & no changes made...    CHOL> on diet alone; FLP 3/16 showed TChol 163, TG 70, HDL 53, LDL 96    GI- Divertics> asked to take Align, Metamucil, etc; she had diverticulitis flair in 2012 x2 & treated by DrPatterson w/ Cipro & Flagyl, ok since then.    GU- HxUTI w/ pelvic pressure, back pain, freq&urgency> felt to have a chronic cystitis by DrEskridge & she understands asymptomatic bactiuria...    Hx Breast Cancer> she had surg 1994 w/ lumpectomy & LN dissection followed by XRT & Chemorx; [facility] w/ neg exam & mammogram 3/16- OK...    DJD, LBP, Osteopenia> on Tylenol, Estradiol topically per Gyn, calcium, MVI, VitD1000; she walks >1mi777mfor exercise; followed by DrFields & OlinAlvan Damep right TKR 12/2014; [facility]...     HxEczema> she uses CoQ10,Borage Oil & black current; she likes herbal remedies...  EXAM shows Afeb, VSS, O2sat=96% on RA;  HEENT- neg, mallampati1;  Chest- rhonchi L>R, w/o w/r/consolidation;  Heart- RR w/o m/r/g;  Abd- soft, nontender, neg;  Ext- s/p R-TKR, w/o c/c/e;  Neuro- intact...  EKG 03/18/15 showed NSR, rate87, prob LAE, poor R prog V1-3, otherw STTW are ok...  LABS 03/20/15> we discussed FASTING labs on return... IMP/PLAN>>  Mult medical problems as listed- MariArloastable & did well w/ the knee replacement surg; continue same meds and  her vigorous chest PT regimen; we plan recheck in 3-63mo 53moasting blood work.  ~  August 01, 2015:  4-53mo R67mo Miranda Thompson  right TKR 12/2014 by DrOlin & is now sched for a right reverse total shoulder arthroplasty by DrNorris tomorrow!  She tells me that she still needs to have her left knee done;  She notes that her breathing is OK w/ mild chr cough, min sput production, & DOE w/o change (she walks, rides bike, etc);  She recently ret for FLabs;  We reviewed the following medical problems during today's office visit >>     Bronchiectasis & HxPneumonia> on Advair250Bid & GFN 1200Bid; she is supposed to be doing Flutter treatments and postural drainage as well; PFT 9/15 w/ GOLD stage2-B COPD; mod cough, grey colored sput, intermit hemoptysis, denies f/c/s; last pos culture was 4/14= Pseudomonas, pan-sens & treated w/ Cipro at that time; the regular ChestPT which really helps...    Palpitations & PACs> she had PACs & atrial bigeminy; improved on Atenolol25 (but c/o low endurance); followed by Cherly Hensen & changed to CardizemCD120 (she refused 240 dose) + Losar50, last seen 03/18/15- no AFib or MAT & no changes made...    CHOL> on diet alone; FLP 6/17 showed TChol 174, TG 64, HDL 62, LDL 100    GI- Divertics> asked to take Align, Metamucil, etc; she had diverticulitis flair in 2012 x2 & treated by DrPatterson w/ Cipro & Flagyl, ok since then.    GU- HxUTI w/ pelvic pressure, back pain, freq&urgency> felt to have a chronic cystitis by DrEskridge & she understands asymptomatic bactiuria...    Hx Breast Cancer> surg 1994 w/ lumpectomy & LN dissection followed by XRT & Chemorx; she was released by [facility] in 2003; followed by [place]- seen 12/15 w/ neg exam & mammogram 3/16- OK...    DJD, LBP, Osteopenia> on Tylenol, Estradiol topically per Gyn, calcium, MVI, VitD1000; she walks >78m/d for exercise; followed by DrFields & OAlvan Dame s/p right TKR 12/2014; [facility]  [facility]yn...     HxEczema> she uses CoQ10, Borage Oil & black current; she likes herbal remedies...  EXAM shows Afeb, VSS, O2sat=98% on RA;  HEENT- neg, mallampati1;  Chest- rhonchi L>R, w/o w/r/consolidation;  Heart- RR w/o m/r/g;  Abd- soft, nontender, neg;  Ext- s/p R-TKR, decr ROM right shoulder, w/o c/c/e;  Neuro- intact...  FLABS 07/29/15>  FLP- all parameters at goals on diet alone;  Chems- wnl x Na=132;  CBC- ok w/ Hg=12.2, MCV=97;  TSH=2.61.. IMP/PLAN>>  MMichais stable w/ her severe bronchiectasis & reminded to be diligent w/ her meds and Chest PT treatments;  she is sched for the next of her planned orthopedic procedures;  We plan ROV in 630mosooner if needed prn.  ~  August 14, 2015:  2wk ROV & add-on appt requested for increased cough, green sput production, and difficulty managing the congestion;  She tells me that she had her right shoulder replacement 2 wks ago & since then has been unable to do her postural drainage for her bronchiectasis resulting in incr cough, gren/gray mucus, incr chest congestion, low grade temp & dyspnea; she says her chest feels full;  Home meds include> ADVAIR250Bid, & Mucinex600-2Bid. But she also does a vigorous chest PT regimen w/ flutter valve treatments and postural drainage...     EXAM shows Afeb, VSS, O2sat=97% on RA;  HEENT- neg, mallampati1;  Chest- congested, rhonchi L>R, w/o w/r/consolidation;  Heart- RR w/o m/r/g;  Ext- s/p R-TKR, s/p R shoulder surg/ sling,..   Sput C&S>  Culture grew PSEUDOMONAS (again) & it remains sens to CIPRO...Marland KitchenMarland Kitchen  IMP/PLAN>>  Pt given Duoneb treatment here in office w/ mod thick green sputum=> sent for C&S which grew Pseudomonas sens to Cipro; we treated her w/ Cipro500Bid & NEB w/ Duoneb Tid regularly; she knows to return to her postural drainage treatments as soon as her shoulder recuperation will allow...   ~  February 03, 2016:  [place] 12/2015 1st notingabscessed tooth treated by her dentist w/  Amocicillin x10d; then she developed a sore throat, URI, cough w/ yellow sput & low grade fever; we had her bring in a sput specimen (initially mult species, but later [place] Psuedomonas [place] and treated her w/ Levaqun '750mg'$ x10d + yogurt; she had some intermittent cough w/ streaky hemoptysis, continued her vigorous home COPD regimen & gradually improved... Her Left-TKR surg is now sched for 03/23/16 per DrOlin...    Bronchiectasis & HxPneumonia> on Advair250Bid & GFN 1200Bid; she has refused NEBULIZER meds; she is supposed to be doing Flutter treatments and postural drainage as well; PFT 9/15 w/ GOLD stage2-B COPD; mod cough, grey colored sput, intermit hemoptysis, denies f/c/s; last pos culture was 4/14= Pseudomonas, pan-sens & treated w/ Cipro at that time; the regular ChestPT which really helps...    Palpitations & PACs> she had PACs & atrial bigeminy; improved on Atenolol25 (but c/o low endurance); followed by DCherly Hensen& changed Aten to CardizemCD120 (she refused 240 dose) + Losar50, last seen 03/18/15- no AFib or MAT & no changes made...    EXAM shows Afeb, VSS, O2sat=97% on RA;  HEENT- neg, mallampati1;  Chest- congested, rhonchi L>R, w/o w/r/consolidation;  Heart- RR w/o m/r/g;  Ext- s/p R-TKR, s/p R shoulder surg;  Neuro- neg, intact...  Sput C&S 01/01/16>  Mod PSEUDOMONAS- resist to Cipro/ Levaq and sens- Fortaz, Primaxin, Piperacillin...   CXR 01/28/16>  Norm heart size, hyperinflated w/ chr changes of scarring & bronchiectasis on left- NAD, osteopenia, right shoulder prosthesis, clips over left breast & left axilla  LABS 01/28/16>  Chems- wnl;  CBC- Hg=12.4, otherw wnl... IMP/PLAN>>  It would be additionally beneficial for her to be on a NEB e/ med Tid=Qid before her chest PT/ postural drainage treatments but she is quite sure she cannot tolerate the neb rx;  Her orthopedic issues mount & every surgery/ every general anesthetic takes a toll;  Rec to continue Advair250Bid, Mucinex  1200Bid, fluids, and the chest Rx...  ~  August 05, 2016:  623moOV & Jinan's CC is some loose stool occurring every 2-3d, [facility] we discussed Metamucil rx in less water & stop her Mag500-2/d supplement;  Otherw feeling OK sl dry cough at night, small amt gray-green phlegm, no blood, notes SOB/ DOE w/ stairs/ walking; no CP/ palpit, etc on her ex-bike... We reviewed interval Epic notes >>     She had left TKR by DrOlin 03/23/16>  Did well w/ surg & disch w/ [facility] therapy...    She saw GYN-DebLeonard 05/26/16 for her well-woman exam>  [id number], using Yuvafem, no issues...    She saw CARDS-DrNishan 06/11/16>  Hx palpit, atrial bigeminy on office EKG, prev 2DEcho was ok- PAsys=31; no hx MAT or AFib; good control of BP on Cardizem/Losartan;  Felt to be stable- no changes made...  We reviewed the following medical problems during today's office visit >>     Bronchiectasis & HxPneumonia> on Advair250Bid & GFN 1200Bid; she is supposed to be doing Flutter treatments and postural drainage as  well; PFT 9/15 w/ GOLD stage2-B COPD; mod cough, grey colored sput, intermit hemoptysis, denies f/c/s; last pos culture was 4/14= Pseudomonas, pan-sens & treated w/ Cipro at that time; the regular ChestPT which really helps...    Palpitations & PACs> she had PACs & atrial bigeminy; improved on Atenolol25 (but c/o low endurance); followed by Cherly Hensen & changed to CardizemCD120 (she refused 240 dose) + Losar50, last seen 05/2016- no AFib or MAT & no changes made...    CHOL> on diet alone; FLP 6/17 showed TChol 174, TG 64, HDL 62, LDL 100    GI- Divertics> asked to take Align, Metamucil, etc; she had diverticulitis flair in 2012 x2 & treated by DrPatterson w/ Cipro & Flagyl, ok since then.    GU- HxUTI w/ pelvic pressure, back pain, freq&urgency> felt to have a chronic cystitis by DrEskridge & she understands asymptomatic bactiuria...    Hx Breast Cancer> surg 1994 w/ lumpectomy & LN dissection  followed by XRT & Chemorx; she was released by [facility] in [facility]; followed by [place]- seen 05/2016 w/ neg exam & mammogram 4/18- OK...    DJD, LBP, Osteopenia> on Tylenol, Estradiol topically per Gyn, calcium, MVI, VitD1000; she walks >33m/d for exercise; followed by DrFields & OAlvan Dame s/p right TKR 12/2014 & left TKR 02/2016; [facility].    HxEczema> she uses CoQ10, Borage Oil & black current; she likes herbal remedies...  EXAM shows Afeb, VSS, O2sat=97% on RA;  HEENT- neg, mallampati1;  Chest- rhonchi L>R, w/o w/r/consolidation;  Heart- RR w/o m/r/g;  Abd- soft, nontender, neg;  Ext- s/p R-TKR, decr ROM right shoulder, w/o c/c/e;  Neuro- intact...  Labs 08/05/16>  Chems- ok x Na=130, Cr=0.59, LFTs wnl;  Mg=2.1;  CBC- wnl w/ Hg=13.2, mcv=96;  Fe=169 (46%sat), Ferritin=64;  TSH=2.40;  VitD=40 > same meds, consider some gatorade... IMP/PLAN>>  MIceyis rec to stop the Mag supplement & add metamucil w/ sm amt water;  We reviewed her vigorous bronchiectasis regimen w/ Advair, GFN, fluids, chest PT & postural drain;  Continue other meds the same & call for any breathing issues...   ~  February 10, 2017:  667moOV & Miranda Thompson reports that she is "doing great" breathing about the same w/ mild cough, w/ small amt gray sput, no blood, SOB w/ stairs and walking but stable- no deterioration & she continues ex-bike/ floor exercises/ etc; denies CP/ palpit/edema, and no f/c/s...  We reviewed the following interval Epic entries>      [facility]  C/o chest & abd pain- occurred while eating, resolved spont in 1560m Exam was unremarkable, Labs- ok x Na=130, BS=141, and norm CBC/ lipase/ troponin; EKG at baseline w/o acute changes;  CXR showed chr changes in lingula & RML, ?nodule in right mid-lung near prev scarring;  Pt reassured- no additional therapy & referred back to PCP for outpt eval...     She saw GI-DrPyrtle 10/15/16>  S/p surveillance colon 07/2014 w/ 2  small polyps (tub adenomas), mod diverticulosis; noted episode of ?esoph spasm w/ CP & outlined this eval >>            ~Ba Swallow/ UGI 10/21/16>  IMPRESSION: 1. Mild discoordination of swallowing with residue in the vallecula and piriform sinuses after swallows, and with laryngeal penetration of contrast down to the cords. This did not elicit a spontaneous cough response. 2. Mild nonspecific esophageal dysmotility. Partial disruption of primary peristaltic waves in the midthoracic  esophagus. 3. Mild distal esophageal fold thickening may reflect mild esophagitis. No ulceration...           ~EGD 10/29/16 by DrPyrtle>  Mild acid reflux esophagitis, 4cm HH, stomach 7 duodenum- ok...  Rx=> Protonix'40mg'$ /d...            ~Modified Ba Swallow by speech path 11/11/16>  FINDINGS:  Thin liquid- with swallows from a straw, patient demonstrated vallecular and piriform sinus retention. This clears primarily with dry swallows. Single episode of flash penetration.   Nectar thick liquid- vallecular retention.   Pure- within normal limits   Barium tablet -  within normal limits   IMPRESSION:  Mild swallow dysfunction, primarily as evidenced by vallecular and piriform sinus retention. We reviewed the following medical problems during today's office visit>      Bronchiectasis & HxPneumonia> on Advair250Bid & GFN 1200Bid; she is supposed to be doing Flutter treatments and postural drainage as well; PFT 9/15 w/ GOLD stage2-B COPD; mod cough, grey colored sput, intermit hemoptysis, denies f/c/s; last pos culture was 4/14= Pseudomonas, pan-sens & treated w/ Cipro at that time; the regular ChestPT which really helps...    Palpitations & PACs> she had PACs & atrial bigeminy; improved on Atenolol25 (but c/o low endurance); followed by Cherly Hensen & changed to CardizemCD120 (she refused 240 dose) + Losar50, last seen 05/2016- no AFib or MAT & no changes made...    CHOL> on diet alone; FLP 6/17 showed TChol 174, TG 64, HDL 62, LDL 100    GI-  Dysphagia, 4cmHH, esophagitis> eval by DrPyrtle 10/2016=> on Protonix40, + swallowing strategies by speech path    GI- Divertics> asked to take Align, Metamucil, etc; she had diverticulitis flair in 2012 x2 & treated by DrPatterson w/ Cipro & Flagyl, ok since then.    GU- HxUTI w/ pelvic pressure, back pain, freq&urgency> felt to have a chronic cystitis by DrEskridge & she understands asymptomatic bactiuria...    Hx Breast Cancer> surg 1994 w/ lumpectomy & LN dissection followed by XRT & Chemorx; she was [facility]; followed by [place]- seen 05/2016 w/ neg exam & mammogram 4/18- OK...    DJD, LBP, Osteopenia> on Tylenol, Estradiol topically per Gyn, calcium, MVI, VitD1000; she walks >63m/d for exercise; followed by DrFields & OAlvan Dame s/p right TKR 12/2014 & left TKR 02/2016; rt shoulder arthroplasty; [facility].    HxEczema> she uses CoQ10, Borage Oil & black current; she likes herbal remedies...  EXAM shows Afeb, VSS, O2sat=99% on RA;  HEENT- neg, mallampati1;  Chest- rhonchi L>R, w/o w/r/consolidation;  Heart- RR w/o m/r/g;  Abd- soft, nontender, neg;  Ext- s/p R-TKR, decr ROM right shoulder, w/o c/c/e;  Neuro- intact...  CXR 12/19/128 (independently reviewed by me in the PACS system) showed stable heart size, enlarged central PAs, hyperinflation w/ chr fibro-interstitial changes, apical scarring, surg changes on left, chr reticular opac LUL, sm nodular opac right mid zone, no acute dis...  IMP/PLAN>>  MRyhannais clinically stable at 842w/ her severe pulm dis- COPD, bronchiectasis, hx pneumonias, post-inflamm scarring;  Rec to continue her ADVAIR Bid, Mucinex '1200mg'$ Bid, Fluids, and an anti-reflux regimen...  ~  April 27, 2017:  334moOV & add-on appt requested for occipital pain in conjunction w/ recent abscessed tooth>  Pt reports pain in the right occipital area x 6wks, then she noted an abscessed tooth in right mandible- tooth was pulled &  abscess drained, treated w/ Augmentin;  She denies f/c/s, visual symptoms, notes hearing is normal & no tinnitus, no focal neuro symptoms other than some dizziness;  She continues to do her resp treatments w/ Advair, GFN, Flutter rx and postural drainage for her bronchiectasis.    Bronchiectasis & HxPneumonia> on Advair250Bid & GFN 1200Bid; she is supposed to be doing Flutter treatments and postural drainage as well; PFT 9/15 w/ GOLD stage2-B COPD; mod cough, grey colored sput, intermit hemoptysis, denies f/c/s; last pos culture was 4/14= Pseudomonas, pan-sens & treated w/ Cipro at that time; the regular ChestPT which really helps...    Palpitations & PACs> she had PACs & atrial bigeminy; improved on Atenolol25 (but c/o low endurance); followed by Cherly Hensen & changed to CardizemCD120 (she refused 240 dose) + Losar50, last seen 05/2016- no AFib or MAT & no changes made...    Hx Breast Cancer> surg 1994 w/ lumpectomy & LN dissection followed by XRT & Chemorx; she was [facility] 2003; followed by [facility]- seen 05/2016 w/ neg exam & mammogram 4/18- OK... EXAM shows Afeb, VSS, O2sat=98% on RA;  HEENT- neg, mallampati1;  Chest- rhonchi L>R, w/o w/r/consolidation;  Heart- RR w/o m/r/g;  Abd- soft, nontender, neg;  Ext- s/p R-TKR, decr ROM right shoulder, w/o c/c/e;  Neuro- intact w/o focal neuro deficits...  LABS 04/27/17>  Chems- wnl x Na=132, Cr=0.56, BS=89, LFTs wnl;  CBC- ok w/ Hg=12.7, WBC=5.4;  TSH=2.67;  Sed=17  CT Head 05/05/17>  Impression: normal for age CT Head (with & without contrast)- cerebral vol has decr since 2004 but is wnl for age; sm dystrophic calcif in cbll nuclei & lentiform nuclei- felt to be inconsequential (no encephalomalacia, no abn enhancement); calcif atherosclerosis at skull base... IMP/PLAN>>  LABS and CT Head are ok- nothing acute, and we decided to treat conservatively w/ rest, OTC analgesics, and time;  She will call if symptoms worsen or are not  improving sufficiently over time...    ~  August 11, 2017:  60moROV & MCarenreports doing well, no new complaints or concerns, notes chr stable DOE w/ hiils or stairs but otherw w/ ADLs etc; she notes similar chr cough/ sputum- productive w/ her Advair Bid, GFN, Flutter valve rx & postural drainage    Bronchiectasis & HxPneumonia> on Advair250Bid & GFN 1200Bid; she is supposed to be doing Flutter treatments and postural drainage as well; PFT 9/15 w/ GOLD stage2-B COPD; mod cough, grey colored sput, intermit hemoptysis, denies f/c/s; last pos culture was 4/14= Pseudomonas, pan-sens & treated w/ Cipro at that time; the regular ChestPT which really helps...    Palpitations & PACs> she had PACs & atrial bigeminy; improved on Atenolol25 (but c/o low endurance); followed by DCherly Hensen& changed to CardizemCD120 (she refused 240 dose) + Losar50, last seen 05/2016- no AFib or MAT & no changes made...    Hx Breast Cancer> surg 1994 w/ lumpectomy & LN dissection followed by XRT & Chemorx; [facility] 05/2016 w/ neg exam & mammogram 4/18- OK...    DJD, LBP, Osteopenia> on Tylenol, Estradiol topically per Gyn, calcium, MVI, VitD1000; she walks >155md for exercise; followed by DrFields & OlAlvan Dames/p right TKR 12/2014 & left TKR 02/2016; rt shoulder arthroplasty; [facility]. EXAM shows Afeb, VSS, O2sat=98% on RA;  HEENT- neg, mallampati1;  Chest- rhonchi L>R, w/o w/r/consolidation;  Heart- RR w/o m/r/g;  Abd- soft, nontender, neg;  Ext-  s/p R-TKR, decr ROM right shoulder, w/o c/c/e;  Neuro- intact w/o focal neuro deficits... IMP/PLAN>>  Bhavya is stable- hx signif bronchiectasis & reminded to stay vigorous on her treatments w/ Advair, GFN, fluids, postural drain & ChestPT; she has also undergone signif orthopedic reconstructive surg w/ bilat TKRs and right shoulder arthroplasty, asked to remains active etc... We plan rov recheck in  about 58moprior to my planned retirement...           Problem List:     ALLERGIC RHINITIS (ICD-477.9) - she uses OTC antihistamines Prn.  BRONCHIECTASIS (ICD-494.0) & Hx of PNEUMONIA (ICD-486) - on ADVAIR 250 Bid, & MUCINEX 1-2Bid...  Long hx severe bronchiectasis, granulomatous lung dis, and pneumonia (initially Dx 1968 by DrFrazier, subseq bx= noncaseating gran dz & chr inflam)... her last bronchoscopy was 4/04 revealing some mucus plugging... CTChest 4/04 showed marked bronchiectasis in lingular, LLL, RML, and mucus plugging... CT repeated 6/06- similar. ~  Sputum cultures 5/08 grew Serratia (R to amox/ceph, S to all others), branching part acid fast bacilli resembling nocardia, yeast c/w candida... ~  CXR 1/09 chr lung dis, no change from prev w/ left mid lung zone as the worst area... ~  CXR 3/10 w/ chr lung changes and NAD suspected... ~  Sputum 8/10 showed NTF only & AFB smears & cult all neg... ~  CXR 3/11 showed chr lung dis, no acute changes, stable... ~  CXR 3/12 showed chronic changes, scarring, NAD..Marland Kitchen ~  CXR 3/13 showed essentially no change> normal heart size, prom pulm arts, bilat interstitial prominence & nodularity, bronchiectasis, NAD, surg clips from prev left breast ca surg. ~  9/13: she presents w/ an acute exac w/ cough, thick sput, congestion, fever, SOB, & chest discomfort; CXR shows worse left mid zone opacity; We decided to treat w/ empiric Levaquin x10d pending cult report==> ret w/ resist Pseudomonas, but pt states better/ improved on the Levaquin. ~  Sputum C&S 4/14 showed Pseudomonas sens to Cefepime, FTressie Ellis Cipro, Levaquin, etc ~  3/15 & 9/15: on Advair250Bid (not taking regularly) & GFN 1200Bid; mod cough, grey colored sput, intermit hemoptysis, denies f/c/s; last pos culture was 4/14= Pseudomonas, pan-sens & treated w/ Cipro. ~  CXR 3/15 showed stable film w/ scarring LUL, no change and surg clips left breast & axilla... ~  PFTs 9/15 showed FVC= 2.08 (77%), FEV1=  1.22 (61%), %1sec= 59%, mid-flows= 31% predicted... c/w GOLD Stage2 COPD, pt GroupB... ~  3/16: on Advair250Bid & GFN 1200Bid (asked to take regularly); PFT 9/15 w/ GOLD stage2-B COPD; mod cough, grey colored sput, intermit hemoptysis, denies f/c/s; last pos culture was 4/14= Pseudomonas, pan-sens & treated w/ Cipro at that time; she does regular ChestPT... ~  CXR 3/16 is stable- chronic changes on left, clips from prev left breast surg, DDD in Tspine ~  9/16: she is reminded to use the Advair250Bid & the Mucinex600-2Bid w/ fluids and maintain a vigorous chest PT & postural drainage regimen...  PALPITATIONS >> followed by DBarton Fannyon Atenolol25 & improved... ~  2DEcho 3/15 showed norm LV size & function w/ EF=55-60% and no regional wall motion abn, mild MR, mild TR, PAsys=362mg... ~  9/16: she reminds me that daughter does not want her on Atenolol; DrNishan ordered CardizemCD240 but she "only wants low dose meds"; therefore try CardizemCD120/d... ~  10/16: pt returns w/ extensive BP records on her CardizemCD120/d; BP today= 132/74 but BP at home up to 160/100+ and we decided to add LOSATAN '50mg'$ /d to her regimen...Marland KitchenMarland Kitchen  HYPERCHOLESTEROLEMIA, BORDERLINE (ICD-272.4) - on diet alone... ~  FLP 9/10 showed TChol 174, TG 64, HDL 49, LDL 112 ~  FLP 9/11 showed TChol 175, TG 61, HDL 48, LDL 115 ~  FLP 3/13 showed TChol 176, TG 69, HDL 64, LDL 98 ~  FLP 9/13 showed TChol 169, TG 65, HDL 48, LDL 108 ~  FLP 3/14 showed TChol 157, TG 54, HDL 46, LDL 100 ~  FLP 3/15 showed TChol 172, TG 50, HDL 56, LDL 106 ~  FLP 3/16 showed TChol 163, TG 70, HDL 53, LDL 96  Hx of ADENOCARCINOMA, BREAST (ICD-174.9) - S/P surgery 5/94 [facility] w/ wide excision L breast lesion and LN dissection (stage II 1.0  cm tumor), w/ post op XRT and chemotherapy... she was followed by [facility] for oncology... then DrLivesay (released in 2003). ~  routine Mammogram 2/10 was neg- f/u planned 87yr ~  f/u mammogram 2/11 = neg, NAD, f/u  planned 127yr~  f/u mammogram 2/12 = neg, NAD, f/u 1y58yr  f/u mammogram 2/13 = neg, NAD... Marland Kitchen  f/u mammogram 2/14 = neg, scat parenchymal densities... ~  [facility] Marland Kitchen  [facility]....  DIVERTICULOSIS OF COLON (ICD-562.10) - colonoscopy 3/05 by DrPatterson showed divertics only... she has +fam hx colon ca w/ colon check [id number] f/u colonoscopy 5/11 showed +divertics, no polyps... ~  3/12:  Hx, exam & CTAbd c/w diverticulitis; treated w/ Cipro/ Flagyl & resolved;  Advised Metamucil, Miralax, etc. ~  11/12:  She had f/u DrPatterson after another bout of diverticulitis; he notes regular BMs, good appetite, stable weight; he wanted her to STOP Naprosyn, ok to use Tylenol/ Tramadol, rec hi fiber, saw the Dive[facility]ie. ~  She had a routine f/u colonoscopy 08/08/14 by DrPyrtle> +FamHx colon cancer in father; 2 sessile polyps removed, 3-5mm 5me, mod diverticulosis, Path= tubular adenomas...  DEGENERATIVE JOINT DISEASE (ICD-715.90) - see Ortho eval 2010 by DrDaldorf... she takes Glucosamine & Herbal supplements- milk thistle/ tumeric which she says really helps... ~  4/14: she saw DrKarlFields for SportsMed> c/o bilat knee pain, known arthritis, uses body helix knee (compression) sleeves & Tramadol prn; takes tart red cherry oil daily & he rec devil's claw, tumeric, & curcumin... ~  3/15: she continues to f/u w/ drFields for knee shots periodically... ~  3/16: she is requesting name of Ortho for poss [place]  She is managed by DrFields and DrOlin => told she needs TKRs & we will send medical clearance...  LOW BACK PAIN SYNDROME (ICD-724.2) - [facility] showed herniated disc L3-4 w/ mod spinal stenosis and lat recess stenosis bilat...   ROTATOR CUFF SYNDROME, RIGHT (ICD-726.10) - [facility].  OSTEOPENIA (ICD-733.90) - [facility] showed [facility],  norm hip, osteopenic forearm (-2.0)... followed by GYN = DrRomaine who is treating a low Vit D level... she takes calcium, Vitamins, & Vit D 1000u daily from DrRomPentwater  labs 9/10 on 50K weekly showed Vit D level = 42... rec> change to 1000 u daily. ~  labs 9/11 on 1000 u daily showed Vit D level = 34... rec> continue daily supplement. ~  Labs 3/13 showed Vit D level = 35... Continue supplements. ~  Labs 3/14 showed Vit D level = 31 ~  Labs 3/15 showed Vit D level = 49... Continue supplements  Hx of ANEMIA (ICD-285.9) -  resolved... ~  labs 9/10 showed Hg= 13.6 ~  labs 9/11 showed Hg= 13.2 ~  Labs 3/13 showed Hg= 13.4 ~  Labs 3/14 showed Hg= 13.3 ~  Labs 3/15 showed Hg= 13.4 ~  Labs 3/16 showed Hg= 13.1  ECZEMA - she has hx of eczema on her hands and has used CoQ10 and Borage Oil as rec by an Administrator in [place]... recently changed to [facility] Current instead of these other supplements & rash is much improved...   Past Surgical History:  Procedure Laterality Date  . COLONOSCOPY    . left breast lumpectomy and LN dissection  06/1992   Dr. [name]  . LUNG BIOPSY  1968   TSurg---Dr. [name]  . LUNG BIOPSY  2012   dr [name]  . POLYPECTOMY    . REVERSE SHOULDER ARTHROPLASTY Right 08/02/2015   Procedure: RIGHT REVERSE TOTAL SHOULDER ARTHROPLASTY;  Surgeon: [name], MD;  Location: [place];  Service: Orthopedics;  Laterality: Right;  . REVERSE TOTAL SHOULDER ARTHROPLASTY Right 08/02/2015  . TONSILLECTOMY    . TOTAL KNEE ARTHROPLASTY Right 01/21/2015   Procedure: TOTAL RIGHT KNEE ARTHROPLASTY;  Surgeon: [name], MD;  Location: [facility];  Service: Orthopedics;  Laterality: Right;  . TOTAL KNEE ARTHROPLASTY Left 03/23/2016   Procedure: LEFT TOTAL KNEE ARTHROPLASTY;  Surgeon: [name], MD;  Location: [facility];  Service: Orthopedics;  Laterality: Left;    Outpatient Encounter Medications as of 08/11/2017  Medication Sig  . Calcium Carb-Cholecalciferol 600-800 MG-UNIT TABS Take 1 tablet by  mouth daily.  . cholecalciferol (VITAMIN D) 1000 UNITS tablet Take 1,000 Units by mouth daily.  Marland Kitchen diltiazem (CARDIZEM CD) 120 MG 24 hr capsule TAKE 1 CAPSULE (120 MG TOTAL) BY MOUTH DAILY.  Marland Kitchen Estradiol (YUVAFEM) 10 MCG TABS vaginal tablet Place 1 tablet (10 mcg total) vaginally 2 (two) times a week.  . Fluticasone-Salmeterol (ADVAIR) 250-50 MCG/DOSE AEPB Inhale 1 puff into the lungs daily.  . Guaifenesin 1200 MG TB12 Take 600 mg by mouth 2 (two) times daily.   Marland Kitchen losartan (COZAAR) 50 MG tablet Take 1 tablet (50 mg total) by mouth daily.  . Multiple Vitamins-Minerals (MULTIVITAMIN & MINERAL PO) Take 1 tablet by mouth daily.    . pantoprazole (PROTONIX) 40 MG tablet TAKE 1 TABLET BY MOUTH EVERY DAY  . Saccharomyces boulardii (FLORASTOR PO) Take by mouth.  . [DISCONTINUED] diltiazem (CARDIZEM CD) 120 MG 24 hr capsule Take 1 capsule (120 mg total) by mouth daily.  . [DISCONTINUED] UNABLE TO FIND Pantoprazole po   No facility-administered encounter medications on file as of 08/11/2017.     No Known Allergies    Immunization History  Administered Date(s) Administered  . Influenza Split 12/03/2011  . Influenza Whole 11/30/2007, 11/30/2008, 12/03/2009, 11/24/2010  . Influenza, High Dose Seasonal PF 11/19/2015, 12/02/2016  . Influenza,inj,Quad PF,6+ Mos 11/10/2012, 11/08/2013, 11/06/2014  . Influenza-Unspecified 12/02/2016  . Pneumococcal Conjugate-13 05/09/2014  . Pneumococcal Polysaccharide-23 11/07/2007  . Tdap 05/09/2014    Current Medications, Allergies, Past Medical History, Past Surgical History, Family History, and Social History were reviewed in [employer] record.     Review of Systems         See HPI - all other systems neg except as noted...  The patient complains of dyspnea on exertion and abdominal pain.  The patient denies anorexia, fever, weight loss, weight gain, vision loss, decreased hearing, hoarseness, chest pain, syncope, peripheral edema,  prolonged cough, headaches, hemoptysis, melena, hematochezia, severe indigestion/heartburn, hematuria, incontinence, muscle weakness, suspicious  skin lesions, transient blindness, difficulty walking, depression, unusual weight change, abnormal bleeding, enlarged lymph nodes, and angioedema.     Objective:   Physical Exam     WD, WN, 84 y/o WF in NAD... GENERAL:  Alert & oriented; pleasant & cooperative... HEENT:  [place]/AT, EOM-wnl, PERRLA, EACs-clear, TMs-wnl, NOSE-clear, THROAT-clear & wnl. NECK:  Supple w/ fairROM; no JVD; normal carotid impulses w/o bruits; no thyromegaly or nodules palpated; no lymphadenopathy. CHEST:  scat bilat rhonchi/ congestion without wheezing, rales or signs of consolidation... HEART:  Regular Rhythm; without murmurs/ rubs/ or gallops heard... ABDOMEN:  Soft w/ mild tender LLQ; normal bowel sounds; no organomegaly or masses detected. EXT: without deformities or arthritic changes; no varicose veins/ venous insuffic/ or edema. NEURO:  CN's intact;  no focal neuro deficits... DERM:  No lesions noted; no rash etc...  RADIOLOGY DATA:  Reviewed in the EPIC EMR & discussed w/ the patient...  LABORATORY DATA:  Reviewed in the EPIC EMR & discussed w/ the patient...    Assessment & Plan:    Occipital HA and recent Tooth abscess>>  04/27/17>   LABS and CT Head are ok- nothing acute, and we decided to treat conservatively w/ rest, OTC analgesics, and time;  She will call if symptoms worsen or are not improving sufficiently over time...   COPD/ Bronchiectasis/ Hx pneumonia>  On Advair250, Mucinex2Bid, Fluids & vigorous Chest PT/ postural drainage... 9/13> treating acute exac w/ Levaquin x10d; Cult grew a resist Pseudomonas but she reports clinically improved, therefore continue present Rx & follow...  Repeat Sput C&S 4/14 showed pansens Pseudomonas... Baseline CXRs showed CXR 3/15 & 3/16 showed scarring LUL, no change, and surg clips left breast & axilla... PFTs 9/15 showed  FVC= 2.08 (77%), FEV1= 1.22 (61%), %1sec= 59%, mid-flows= 31% predicted... c/w GOLD Stage2 COPD, pt GroupB... REMINDED TO TAKE MEDS REGULARLY & MAINTAIN A VIGOROUS REGIMEN OF CHEST PT & POSTURAL DRAINAGE... 07/2015> she developed a bronchiectasis exac after R shoulder surg when she was no longer able to do her chest Pt & postural drain=> cult grew pseudomonas sens to cipro=> treated w/ this + home NEB using DUONEB Tid... 02/03/16>  It would be additionally beneficial for her to be on a NEB w/ med Tid-Qid before her chest PT/ postural drainage treatments but she is quite sure she cannot tolerate the neb rx;  Her orthopedic issues mount & every surgery/ every general anesthetic takes a toll;  Rec to continue Advair250Bid, Mucinex 1200Bid, fluids, and the chest Rx. 08/05/16>   Ezelle is rec to stop the Mag supplement & add metamucil w/ sm amt water;  We reviewed her vigorous bronchiectasis regimen w/ Advair, GFN, fluids, chest PT & postural drain;  Continue other meds the same & call for any breathing issues. 02/10/17>   Aunesty is clinically stable at 22 w/ her severe pulm dis- COPD, bronchiectasis, hx pneumonias, post-inflamm scarring;  Rec to continue her ADVAIR Bid, Mucinex '1200mg'$ Bid, Fluids, and an anti-reflux regimen... 08/11/17>   Olina is stable- hx signif bronchiectasis & reminded to stay vigorous on her treatments w/ Advair, GFN, fluids, postural drain & ChestPT; she has also undergone signif orthopedic reconstructive surg w/ bilat TKRs and right shoulder arthroplasty, asked to remains active etc... We plan rov recheck in about 37moprior to my planned retirement.   HBP & palpit (PACs)>  followed by DCherly Hensen on CardizemCD120 & she does not want BBlockers, & only wants "low-dose" meds... 10/16>   BP at home variable w/ 160/100+ episodes so we  decided to add low dose LOSARTAN '50mg'$ /d... 1/17>   BP much better w/ the Losar50 added- continue same...   CHOL>  On diet alone & f/u FLP is at  goal...  Breast Cancer>  S/[facility] & oncology f/u by DrNeijstrom, then [facility] 2003 & released; yearly mammograms & monthly self exams were wnl...  Divertics>  She had acute diverticulitis 3/12 & 10/12> resolved w/ diet adjust & Cipro/Flagyl, now back to baseline & advised re- Metamucil, Fiberall, etc; she saw DrPatterson 11/12.  DJD/ LBP>  Ortho is DrOlin & DrFields; she takes Glucosamine & Herbs;  11/16>   s/p right TKR 12/2014 by DrOlin 6/17>   sched for right total shoulder replacement by DrNorris  Osteopenia/ Vit D defic>  On Calcium, MVI, Vit D supplement; followed by GYN= DrRomaine...  Other medical issues as noted...    Patient's Medications  New Prescriptions   No medications on file  Previous Medications   CALCIUM CARB-CHOLECALCIFEROL 600-800 MG-UNIT TABS    Take 1 tablet by mouth daily.   CHOLECALCIFEROL (VITAMIN D) 1000 UNITS TABLET    Take 1,000 Units by mouth daily.   DILTIAZEM (CARDIZEM CD) 120 MG 24 HR CAPSULE    TAKE 1 CAPSULE (120 MG TOTAL) BY MOUTH DAILY.   ESTRADIOL (YUVAFEM) 10 MCG TABS VAGINAL TABLET    Place 1 tablet (10 mcg total) vaginally 2 (two) times a week.   FLUTICASONE-SALMETEROL (ADVAIR) 250-50 MCG/DOSE AEPB    Inhale 1 puff into the lungs daily.   GUAIFENESIN 1200 MG TB12    Take 600 mg by mouth 2 (two) times daily.    LOSARTAN (COZAAR) 50 MG TABLET    Take 1 tablet (50 mg total) by mouth daily.   MULTIPLE VITAMINS-MINERALS (MULTIVITAMIN & MINERAL PO)    Take 1 tablet by mouth daily.     PANTOPRAZOLE (PROTONIX) 40 MG TABLET    TAKE 1 TABLET BY MOUTH EVERY DAY   SACCHAROMYCES BOULARDII (FLORASTOR PO)    Take by mouth.  Modified Medications   No medications on file  Discontinued Medications   DILTIAZEM (CARDIZEM CD) 120 MG 24 HR CAPSULE    Take 1 capsule (120 mg total) by mouth daily.   UNABLE TO FIND    Pantoprazole po

## 2017-09-20 NOTE — Progress Notes (Signed)
Patient ID: Miranda Thompson, female   DOB: 01-Feb-1934, 84 y.o.   MRN: [id number]   84 y.o. first seen in 2015 for palpitations. Mostly atrial arrhythmias due to her lung disease and bronchiectasis that is followed by Dr [name]  No history of MAT or PAF No bleeding diathesis   Started on Atenolol 05/17/13   Eventually complained of low endurance and stopped at daughters request  Cardizem started l for palpitations and HTN Holter x 2 only shows PACls/ PVC;s   BP monitor showed good control  Echo ok with normal EF 05/19/2013   Study Conclusions  - Left ventricle: The cavity size was normal. Systolic function was normal. The estimated ejection fraction was in the range of 55% to 60%. Wall motion was normal; there were no regional wall motion abnormalities. Doppler parameters are consistent with mildly elevated ventricular end-diastolic filling pressure. - Aortic valve: Trileaflet; mildly thickened leaflets. Transvalvular velocity was within the normal range. There was no stenosis. No regurgitation. - Aortic root: The aortic root was normal in size. - Mitral valve: Mildly thickened leaflets . Mild regurgitation. - Right ventricle: The cavity size was mildly dilated. Wall thickness was normal. Systolic function was normal. - Right atrium: The atrium was mildly dilated. - Atrial septum: No defect or patent foramen ovale was identified. - Tricuspid valve: Mild-moderate regurgitation. - Pulmonic valve: Mild regurgitation. - Pulmonary arteries: Systolic pressure was within the normal range. PA peak pressure: 50mm Hg (S).  No complaints  Still with occasional skips but nothing too intrusive  ROS: Denies fever, malais, weight loss, blurry vision, decreased visual acuity, cough, sputum, SOB, hemoptysis, pleuritic pain, palpitaitons, heartburn, abdominal pain, melena, lower extremity edema, claudication, or rash.  All other systems reviewed and negative  General: BP 112/60   Pulse 82   Ht 5'  3" (1.6 m)   Wt 135 lb (61.2 kg)   LMP 02/23/1985   SpO2 95%   BMI 23.91 kg/m   Affect appropriate Frail elderly female  HEENT: normal Neck supple with no adenopathy JVP normal no bruits no thyromegaly Lungs rhonchi diffuse no  wheezing and good diaphragmatic motion Heart:  S1/S2 no murmur, no rub, gallop or click PMI normal Abdomen: benighn, BS positve, no tenderness, no AAA no bruit.  No HSM or HJR Distal pulses intact with no bruits No edema Neuro non-focal Skin warm and dry No muscular weakness   Current Outpatient Medications  Medication Sig Dispense Refill  . Calcium Carb-Cholecalciferol 600-800 MG-UNIT TABS Take 1 tablet by mouth daily.    . cholecalciferol (VITAMIN D) 1000 UNITS tablet Take 1,000 Units by mouth daily.    Marland Kitchen diltiazem (CARDIZEM CD) 120 MG 24 hr capsule TAKE 1 CAPSULE (120 MG TOTAL) BY MOUTH DAILY. 90 capsule 3  . Estradiol (YUVAFEM) 10 MCG TABS vaginal tablet Place 1 tablet (10 mcg total) vaginally 2 (two) times a week. 8 tablet 11  . Fluticasone-Salmeterol (ADVAIR) 250-50 MCG/DOSE AEPB Inhale 1 puff into the lungs daily. 60 each 5  . Guaifenesin 1200 MG TB12 Take 600 mg by mouth 2 (two) times daily.     Marland Kitchen losartan (COZAAR) 50 MG tablet Take 1 tablet (50 mg total) by mouth daily. 90 tablet 3  . Multiple Vitamins-Minerals (MULTIVITAMIN & MINERAL PO) Take 1 tablet by mouth daily.      . pantoprazole (PROTONIX) 40 MG tablet TAKE 1 TABLET BY MOUTH EVERY DAY 90 tablet 1  . Saccharomyces boulardii (FLORASTOR PO) Take by mouth.     No  current facility-administered medications for this visit.     Allergies  Patient has no known allergies.  Electrocardiogram:  04/30/13  SR atrial bigeminy normal ST segments   10/2014 SR rate 76  LAE  Normal  03/18/15 SR rate 87 LAE otherwise normal 09/22/17 SR PAC otherwise normal   Assessment and Plan  Arrhythmia:  Related to lung disease Atenolol stopped  Stable  No PAF noted  Bronchiectasis:  F/u Nadel lung exam always  abnormal no wheezing activity level good Vasc:  Taking 81 mg aspirin for prevention HTN:  Beta blocker made her more tired  Better with cardizem and cozaar   Will need new pulmonary doctor at end of year and recommended Dr Elsworth Soho or Merlene Laughter

## 2017-09-22 ENCOUNTER — Ambulatory Visit (INDEPENDENT_AMBULATORY_CARE_PROVIDER_SITE_OTHER): Payer: Medicare Other | Admitting: Cardiovascular Disease

## 2017-09-22 ENCOUNTER — Encounter: Payer: Self-pay | Admitting: Cardiovascular Disease

## 2017-09-22 VITALS — BP 112/60 | HR 82 | Ht 63.0 in | Wt 135.0 lb

## 2017-09-22 DIAGNOSIS — R002 Palpitations: Secondary | ICD-10-CM | POA: Diagnosis not present

## 2017-09-22 NOTE — Patient Instructions (Addendum)

## 2017-09-23 NOTE — Addendum Note (Signed)
Addended by: Joaquim Lai on: 09/23/2017 01:49 PM   Modules accepted: Orders

## 2017-09-29 ENCOUNTER — Encounter: Payer: Self-pay | Admitting: Cardiovascular Disease

## 2017-10-27 ENCOUNTER — Encounter: Payer: Self-pay | Admitting: Pulmonary Disease

## 2017-10-27 NOTE — Telephone Encounter (Signed)
Hi,  I have had the shingles shot years ago, I have read that it might not be as effective as the latest serum that is given in two doses. Is it necessary for me to get the updated version?    Thanks for your attention to my inquiry.   SN please advise. Thanks

## 2017-10-29 MED ORDER — ZOSTER VAC RECOMB ADJUVANTED 50 MCG/0.5ML IM SUSR: 0.5000 mL | Freq: Once | INTRAMUSCULAR | 1 refills | Status: AC

## 2017-12-05 ENCOUNTER — Encounter: Payer: Self-pay | Admitting: Pulmonary Disease

## 2017-12-17 ENCOUNTER — Encounter: Payer: Self-pay | Admitting: Pulmonary Disease

## 2018-01-10 ENCOUNTER — Other Ambulatory Visit: Payer: Self-pay | Admitting: Pulmonary Disease

## 2018-01-10 ENCOUNTER — Encounter: Payer: Self-pay | Admitting: Pulmonary Disease

## 2018-01-10 DIAGNOSIS — R634 Abnormal weight loss: Secondary | ICD-10-CM

## 2018-01-10 NOTE — Telephone Encounter (Signed)
Dr. [name] please advise on patients e-mail attached below. Thank you.

## 2018-01-12 ENCOUNTER — Other Ambulatory Visit (INDEPENDENT_AMBULATORY_CARE_PROVIDER_SITE_OTHER): Payer: Medicare Other

## 2018-01-12 DIAGNOSIS — R634 Abnormal weight loss: Secondary | ICD-10-CM | POA: Diagnosis not present

## 2018-01-12 LAB — COMPREHENSIVE METABOLIC PANEL
ALT: 12 U/L (ref 0–35)
AST: 20 U/L (ref 0–37)
Albumin: 4 g/dL (ref 3.5–5.2)
Alkaline Phosphatase: 67 U/L (ref 39–117)
BUN: 20 mg/dL (ref 6–23)
CO2: 28 mEq/L (ref 19–32)
Calcium: 9.2 mg/dL (ref 8.4–10.5)
Chloride: 97 mEq/L (ref 96–112)
Creatinine, Ser: 0.71 mg/dL (ref 0.40–1.20)
GFR: 83.63 mL/min (ref >60.00)
Glucose, Bld: 101 mg/dL — ABNORMAL HIGH (ref 70–99)
Potassium: 4.2 mEq/L (ref 3.5–5.1)
Sodium: 131 mEq/L — ABNORMAL LOW (ref 135–145)
Total Bilirubin: 0.5 mg/dL (ref 0.2–1.2)
Total Protein: 7.3 g/dL (ref 6.0–8.3)

## 2018-01-12 LAB — CBC WITH DIFFERENTIAL/PLATELET
Basophils Absolute: 0.1 10*3/uL (ref 0.0–0.1)
Basophils Relative: 1.2 % (ref 0.0–3.0)
Eosinophils Absolute: 0.2 10*3/uL (ref 0.0–0.7)
Eosinophils Relative: 4.1 % (ref 0.0–5.0)
HCT: 37.7 % (ref 36.0–46.0)
Hemoglobin: 12.9 g/dL (ref 12.0–15.0)
Lymphocytes Relative: 21.3 % (ref 12.0–46.0)
Lymphs Abs: 1.2 10*3/uL (ref 0.7–4.0)
MCHC: 34.1 g/dL (ref 30.0–36.0)
MCV: 97.1 fl (ref 78.0–100.0)
Monocytes Absolute: 0.6 10*3/uL (ref 0.1–1.0)
Monocytes Relative: 11 % (ref 3.0–12.0)
Neutro Abs: 3.5 10*3/uL (ref 1.4–7.7)
Neutrophils Relative %: 62.4 % (ref 43.0–77.0)
Platelets: 245 10*3/uL (ref 150.0–400.0)
RBC: 3.88 Mil/uL (ref 3.87–5.11)
RDW: 12.8 % (ref 11.5–15.5)
WBC: 5.6 10*3/uL (ref 4.0–10.5)

## 2018-01-12 LAB — LIPID PANEL
Cholesterol: 160 mg/dL (ref 0–200)
HDL: 62 mg/dL (ref >39.00)
LDL Cholesterol: 89 mg/dL (ref 0–99)
NonHDL: 98.3
Total CHOL/HDL Ratio: 3
Triglycerides: 46 mg/dL (ref 0.0–149.0)
VLDL: 9.2 mg/dL (ref 0.0–40.0)

## 2018-01-12 LAB — TSH: TSH: 3.09 u[IU]/mL (ref 0.35–4.50)

## 2018-01-12 LAB — URINALYSIS, ROUTINE W REFLEX MICROSCOPIC
Bilirubin Urine: NEGATIVE
Ketones, ur: NEGATIVE
Nitrite: POSITIVE — AB
Specific Gravity, Urine: 1.015 (ref 1.000–1.030)
Total Protein, Urine: NEGATIVE
Urine Glucose: NEGATIVE
Urobilinogen, UA: 0.2 (ref 0.0–1.0)
pH: 7 (ref 5.0–8.0)

## 2018-01-12 LAB — SEDIMENTATION RATE: Sed Rate: 22 mm/hr (ref 0–30)

## 2018-01-13 ENCOUNTER — Encounter: Payer: Self-pay | Admitting: Pulmonary Disease

## 2018-01-13 ENCOUNTER — Ambulatory Visit (INDEPENDENT_AMBULATORY_CARE_PROVIDER_SITE_OTHER): Payer: Medicare Other | Admitting: Pulmonary Disease

## 2018-01-13 VITALS — BP 124/62 | HR 102 | Temp 97.9°F | Ht 63.0 in | Wt 131.0 lb

## 2018-01-13 DIAGNOSIS — M150 Primary generalized (osteo)arthritis: Secondary | ICD-10-CM

## 2018-01-13 DIAGNOSIS — J3089 Other allergic rhinitis: Secondary | ICD-10-CM

## 2018-01-13 DIAGNOSIS — J479 Bronchiectasis, uncomplicated: Secondary | ICD-10-CM | POA: Diagnosis not present

## 2018-01-13 DIAGNOSIS — Z96653 Presence of artificial knee joint, bilateral: Secondary | ICD-10-CM

## 2018-01-13 DIAGNOSIS — M8949 Other hypertrophic osteoarthropathy, multiple sites: Secondary | ICD-10-CM

## 2018-01-13 DIAGNOSIS — N3000 Acute cystitis without hematuria: Secondary | ICD-10-CM

## 2018-01-13 DIAGNOSIS — Z853 Personal history of malignant neoplasm of breast: Secondary | ICD-10-CM

## 2018-01-13 DIAGNOSIS — I498 Other specified cardiac arrhythmias: Secondary | ICD-10-CM

## 2018-01-13 DIAGNOSIS — M159 Polyosteoarthritis, unspecified: Secondary | ICD-10-CM

## 2018-01-13 DIAGNOSIS — Z96611 Presence of right artificial shoulder joint: Secondary | ICD-10-CM

## 2018-01-13 MED ORDER — CIPROFLOXACIN HCL 500 MG PO TABS: 500.0000 mg | ORAL_TABLET | Freq: Two times a day (BID) | ORAL | 0 refills | Status: DC

## 2018-01-13 NOTE — Progress Notes (Addendum)
Patient ID: Miranda Thompson, female   DOB: 07/01/1933, 84 y.o.   MRN: [id number]  Subjective:    Patient ID: Miranda Thompson, female    DOB: [birth year], 84 y.o.   MRN: [id number]  HPI 84 y/o WF here for a follow up visit... she has mult med problems as noted below...   ~  SEE PREV EPIC NOTES FOR OLDER DATA >>    Hx left breast cancer w/ XRT & chemorx in 1994  08/19/04>  CT Chest showed extensive area of marked cylindrical bronchiectasis on the left, some assoc calcif, no signif adenopathy, prior left breast surg...  LABS 3/14:  FLP- at goals on diet alone;  Chems- wnl;  CBC- wnl;  TSH=2.37;  VitD=31;  UA- clear...  SPUTUM 4/14 >> Pseudomonas sensitive to all antibiotics   LABS 9/14:  CBC- ok w/ Hg=13.7, WBC=6.4;  Fe=91 (25%sat)... BMet wasn't done...   CXR 3/15 showed stable film w/ scarring LUL, no change and surg clips left breast & axilla  EKG 3/15 showed NSR w/ PACs and atrial bigem, 1st degree AVB, otherw NAD...   2DEcho 3/15 showed norm LV size & function w/ EF=55-60%, norm wall motion, valves ok w/ sl thickening of leaflets- mildMR, modTR,; mild RV dil but norm RVF & PAsys=31  LABS 3/15:  FLP- ok on diet alone;  Chems- wnl;  CBC- wnl;  Fe=69 (19%sat);  VitD=49;  TSH=4.33... UA is clear, wnl...   ~  November 08, 2013:  52moROV & [name] had an exac 6/15- seen by TP, CXR stable (NAD), treated w/ Levaquin/ Mucinex & improved, but she stopped her Advair & now notes sl incr SOB;  PFT w/ FEV1=1.22 (61%) & she is rec to restart her inhaler;  Notes chr cough w/ green-gray phlegm from her severe bronchiectasis (last pos sputum was 4/14 +Pseudomonas- pansensitive);  EXAM shows bilat rhonchi, no consolidation, and she is stable on her regular regimen and vigorous chest physiotherapy regimen to aide sputum production...     She had f/u DrNishan 4/15> hx palpit, bigeminy on EKG, improved on Aten25; no changes made...    She continues to follow up w/ DrFields> right knee pain, wears brace &  gets shots periodically, his notes are reviewed...  We reviewed prob list, meds, xrays and labs> see below for updates >> OK 2015 Flu vaccine today...   PFT 9/15 showed FVC= 2.08 (77%), FEV1= 1.22 (61%), %1sec= 59%, mid-flows= 31% predicted... c/w GOLD Stage2 COPD, pt GroupB...  Ambulatory O2 Test>  O2sat on RA at rest= 96% w/ pulse=73/min;  Ambulated 3 laps w/ lowest O2sat= 96% w/ pulse=94/min.... PLAN>> continue same Advair250Bid, Mucinex 1200Bid, Fluids, vigorous home chest PT & postural drainage, stay as active as poss...  ~  May 09, 2014:  645moOV & [name] reports a good interval- no intercurrent infections, breathing is about the same but she notes some sl incr DOE assoc w/ decr exercise due to knee arthritis; she sees drFields and as had several shots but wonders about TKR- given the names for DrAlusio & DrOlin as contacts... We reviewed the following medical problems during today's office visit >>     Bronchiectasis & HxPneumonia> on Advair250Bid (not taking regularly) & GFN 1200Bid; PFT 9/15 w/ GOLD stage2-B COPD; mod cough, grey colored sput, intermit hemoptysis, denies f/c/s; last pos culture was 4/14= Pseudomonas, pan-sens & treated w/ Cipro at that time; she does regular ChestPT...    Palpitations & PACs> she had PACs & atrial bigeminy; improved on  Atenolol25; seen by Cherly Hensen 4/15 & 12/15- no AFib or MAT & no changes made...    CHOL> on diet alone; FLP 3/15 showed TChol 172, TG 50, HDL 56, LDL 106    GI- Divertics> asked to take Align, Metamucil, etc; she had diverticulitis flair 2012 x2 & treated by DrPatterson w/ Cipro & Flagyl    GU- HxUTI w/ pelvic pressure, back pain, freq&urgency> felt to have a chronic cystitis by DrEskridge & she understands asymptomatic bactiuria...    Hx Breast Cancer> she had surg 1994 w/ lumpectomy & LN dissection followed by XRT & Chemorx; she was released by [facility] in 2003; followed by [facility]- seen 12/15 w/ neg exam & mammogram 3/16-  OK...    DJD, LBP, Osteopenia> on Tylenol, Estradiol topically per Gyn, calcium, MVI, VitD1000; she walks >15m/d for exercise; followed by DrFields regularly; c/o incr right knee pain & she requested Ortho names for poss TKR (Alusio/ OAlvan Dame; [facility] by DrRomaine=> now seeing DrSMiller...    HxEczema> she uses CoQ10,Borage Oil & black current; she likes herbal remedies...  We reviewed prob list, meds, xrays and labs> see below for updates >>   CXR 3/16 is stable- chronic changes on left, clips from prev left breast surg, DDD in Tspine...  LABS 3/16:  FLP- wnl on diet alone;  Chems- wnl;  CBC- wnl;  TSH=4.99...  ~  November 06, 2014:  634moOV & medical f/u visit>     From the pulmonary standpoint she is stable w/ her severe bronchiectasis but only using her Advair250 once daily & only taking the Mucinex 60053md; asked to incr the Advair250Bid & the Mucinex600-2Bid w/ fluids and maintain a vigorous chest PT & postural drainage routine to clear her airways...    She had f/u CARDS- DrNishan 10/25/14> hx palpit w/ atrial bigeminy on office EKG; prev tried Aten but stopped due to low endurance, 2DEcho 04/2013 showed norm EF=55-60%, no wall motion abn, mildMR, PAsys=31; he added CARDIZEM-CD 240m38mbut pt states that she didn't take it stating that she "only wants low-dose meds" => therefore rec to try CARDIZEN-CD 120mg30m.    She also notes that her BP is "all over the place" but BP monitor was apparently ok; she read her EKG on my chart & had a lot of questions; currently not taking any BP med & BP today= 140/80; I told her that the Cardizem would help regulate her blood pressure as well as help control her palpitations...     She had a routine f/u colonoscopy 08/08/14 by DrPyrtle> +FamHx colon cancer in father; 2 sessile polyps removed, 3-5mm s90m, mod diverticulosis, Path= tubular adenomas...    She saw DrFields for her knees w/ grade4 arthritis on right, cortisone shots lasted  several months, uses compression sleeve & Aleve/ Tramadol; he referred her to DrOlinGeorgetownotes "bone on bone" & rec bilat TKRs...  EXAM shows Afeb, VSS, O2sat=96% on RA;  HEENT- neg, mallampati1;  Chest- diffuse bilat rhonchi/ congestion, w/o wheezing or signs of consolidation;  Heart- RR gr1/6 SEM w/o r/g;  Abd- soft, neg;  Ext- w/o c/c/e...  EKG 06/19/14 showed NSR, rate78, poor R progression V1-3, otherw neg/NAD...  We reviewed prev CXR, LABS, etc... IMP/PLAN>>  She reminded me that her daughter didn't want her on Atenolol, & she only wants "low-dose" meds therefore try CARDIZEM-CD 120mg/d44meeds to incr the Advair250 to Bid & the Mucinex 600mg to2md w/ fluids + continue the postural  drainage/ chest PT regimen... OK 2016 flu vaccine today.  ~  December 18, 2014:  6wk ROV & recheck> Jessicia has been taking the CardizemCD120 since last OV & watching her home BP checks very carefully; pressure readings are somewhat labile- 110/70 range to 160/100+ range using her home cuff; she remains asymptomatic w/o HA, CP, palpit, dizzy, edema, etc; exercise is limited by her knees and she tells me she is ready for TKR surg by DrOlin; we decided to add LOSARTAN34m/d for her BP control & she will continue to monitor it at home... EXAM shows Afeb, VSS, O2sat=96% on RA;  BP=132/74; HEENT- neg, mallampati1;  Chest- diffuse bilat rhonchi w/o wheezing or signs of consolidation;  Heart- RR gr1/6 SEM w/o r/g;  Abd- soft, neg;  Ext- w/o c/c/e... PLAN>>  She will continue all current meds and ADD LOSARTAN 519md; watch BPs at home & call for questions; she asks that I send this note to DrWabaunseeor surg from the medical standpoint...  ~  March 20, 2015:  77m67moV & Aliya had her right TKR by DrOlin 01/21/15, s/p PT, still sl stiff but exercising on bike & walking some- no complications and she is very pleased;  BP is improved w/ the addition of Losartan50 & measures 142/62 today... We reviewed the following medical  problems during today's office visit >>     Bronchiectasis & HxPneumonia> on Advair250Bid (not taking regularly) & GFN 1200Bid; PFT 9/15 w/ GOLD stage2-B COPD; mod cough, grey colored sput, intermit hemoptysis, denies f/c/s; last pos culture was 4/14= Pseudomonas, pan-sens & treated w/ Cipro at that time; she does regular ChestPT which really helps...    Palpitations & PACs> she had PACs & atrial bigeminy; improved on Atenolol25; followed by DrNCherly Hensenchanged to CardizemCD120 (she refused 240 dose), last seen 03/18/15- no AFib or MAT & no changes made...    CHOL> on diet alone; FLP 3/16 showed TChol 163, TG 70, HDL 53, LDL 96    GI- Divertics> asked to take Align, Metamucil, etc; she had diverticulitis flair in 2012 x2 & treated by DrPatterson w/ Cipro & Flagyl, ok since then.    GU- HxUTI w/ pelvic pressure, back pain, freq&urgency> felt to have a chronic cystitis by DrEskridge & she understands asymptomatic bactiuria...    Hx Breast Cancer> she had surg 1994 w/ lumpectomy & LN dissection followed by XRT & Chemorx; [facility] w/ neg exam & mammogram 3/16- OK...    DJD, LBP, Osteopenia> on Tylenol, Estradiol topically per Gyn, calcium, MVI, VitD1000; she walks >1mi777mfor exercise; followed by DrFields & OlinAlvan Damep right TKR 12/2014; [facility]...     HxEczema> she uses CoQ10,Borage Oil & black current; she likes herbal remedies...  EXAM shows Afeb, VSS, O2sat=96% on RA;  HEENT- neg, mallampati1;  Chest- rhonchi L>R, w/o w/r/consolidation;  Heart- RR w/o m/r/g;  Abd- soft, nontender, neg;  Ext- s/p R-TKR, w/o c/c/e;  Neuro- intact...  EKG 03/18/15 showed NSR, rate87, prob LAE, poor R prog V1-3, otherw STTW are ok...  LABS 03/20/15> we discussed FASTING labs on return... IMP/PLAN>>  Mult medical problems as listed- MariArloastable & did well w/ the knee replacement surg; continue same meds and  her vigorous chest PT regimen; we plan recheck in 3-63mo 53moasting blood work.  ~  August 01, 2015:  4-53mo R67mo MarilyStarlaer  right TKR 12/2014 by DrOlin & is now sched for a right reverse total shoulder arthroplasty by DrNorris tomorrow!  She tells me that she still needs to have her left knee done;  She notes that her breathing is OK w/ mild chr cough, min sput production, & DOE w/o change (she walks, rides bike, etc);  She recently ret for FLabs;  We reviewed the following medical problems during today's office visit >>     Bronchiectasis & HxPneumonia> on Advair250Bid & GFN 1200Bid; she is supposed to be doing Flutter treatments and postural drainage as well; PFT 9/15 w/ GOLD stage2-B COPD; mod cough, grey colored sput, intermit hemoptysis, denies f/c/s; last pos culture was 4/14= Pseudomonas, pan-sens & treated w/ Cipro at that time; the regular ChestPT which really helps...    Palpitations & PACs> she had PACs & atrial bigeminy; improved on Atenolol25 (but c/o low endurance); followed by Cherly Hensen & changed to CardizemCD120 (she refused 240 dose) + Losar50, last seen 03/18/15- no AFib or MAT & no changes made...    CHOL> on diet alone; FLP 6/17 showed TChol 174, TG 64, HDL 62, LDL 100    GI- Divertics> asked to take Align, Metamucil, etc; she had diverticulitis flair in 2012 x2 & treated by DrPatterson w/ Cipro & Flagyl, ok since then.    GU- HxUTI w/ pelvic pressure, back pain, freq&urgency> felt to have a chronic cystitis by DrEskridge & she understands asymptomatic bactiuria...    Hx Breast Cancer> surg 1994 w/ lumpectomy & LN dissection followed by XRT & Chemorx; she was released by [facility] in 2003; followed by [facility]- seen 12/15 w/ neg exam & mammogram 3/16- OK...    DJD, LBP, Osteopenia> on Tylenol, Estradiol topically per Gyn, calcium, MVI, VitD1000; she walks >78m/d for exercise; followed by DrFields & OAlvan Dame s/p right TKR 12/2014; [facility]  Gyn...     HxEczema> she uses CoQ10, Borage Oil & black current; she likes herbal remedies...  EXAM shows Afeb, VSS, O2sat=98% on RA;  HEENT- neg, mallampati1;  Chest- rhonchi L>R, w/o w/r/consolidation;  Heart- RR w/o m/r/g;  Abd- soft, nontender, neg;  Ext- s/p R-TKR, decr ROM right shoulder, w/o c/c/e;  Neuro- intact...  FLABS 07/29/15>  FLP- all parameters at goals on diet alone;  Chems- wnl x Na=132;  CBC- ok w/ Hg=12.2, MCV=97;  TSH=2.61.. IMP/PLAN>>  MMichais stable w/ her severe bronchiectasis & reminded to be diligent w/ her meds and Chest PT treatments;  she is sched for the next of her planned orthopedic procedures;  We plan ROV in 630mosooner if needed prn.  ~  August 14, 2015:  2wk ROV & add-on appt requested for increased cough, green sput production, and difficulty managing the congestion;  She tells me that she had her right shoulder replacement 2 wks ago & since then has been unable to do her postural drainage for her bronchiectasis resulting in incr cough, gren/gray mucus, incr chest congestion, low grade temp & dyspnea; she says her chest feels full;  Home meds include> ADVAIR250Bid, & Mucinex600-2Bid. But she also does a vigorous chest PT regimen w/ flutter valve treatments and postural drainage...     EXAM shows Afeb, VSS, O2sat=97% on RA;  HEENT- neg, mallampati1;  Chest- congested, rhonchi L>R, w/o w/r/consolidation;  Heart- RR w/o m/r/g;  Ext- s/p R-TKR, s/p R shoulder surg/ sling,..   Sput C&S>  Culture grew PSEUDOMONAS (again) & it remains sens to CIPRO...Marland KitchenMarland Kitchen  IMP/PLAN>>  Pt given Duoneb treatment here in office w/ mod thick green sputum=> sent for C&S which grew Pseudomonas sens to Cipro; we treated her w/ Cipro500Bid & NEB w/ Duoneb Tid regularly; she knows to return to her postural drainage treatments as soon as her shoulder recuperation will allow...   ~  February 03, 2016:  [place] times thru 12/2015 1st notingabscessed tooth treated by her dentist w/  Amocicillin x10d; then she developed a sore throat, URI, cough w/ yellow sput & low grade fever; we had her bring in a sput specimen (initially mult species, but later reported Psuedomonas R to LOlympia and treated her w/ Levaqun '750mg'$ x10d + yogurt; she had some intermittent cough w/ streaky hemoptysis, continued her vigorous home COPD regimen & gradually improved... Her Left-TKR surg is now sched for 03/23/16 per DrOlin...    Bronchiectasis & HxPneumonia> on Advair250Bid & GFN 1200Bid; she has refused NEBULIZER meds; she is supposed to be doing Flutter treatments and postural drainage as well; PFT 9/15 w/ GOLD stage2-B COPD; mod cough, grey colored sput, intermit hemoptysis, denies f/c/s; last pos culture was 4/14= Pseudomonas, pan-sens & treated w/ Cipro at that time; the regular ChestPT which really helps...    Palpitations & PACs> she had PACs & atrial bigeminy; improved on Atenolol25 (but c/o low endurance); followed by DCherly Hensen& changed Aten to CardizemCD120 (she refused 240 dose) + Losar50, last seen 03/18/15- no AFib or MAT & no changes made...    EXAM shows Afeb, VSS, O2sat=97% on RA;  HEENT- neg, mallampati1;  Chest- congested, rhonchi L>R, w/o w/r/consolidation;  Heart- RR w/o m/r/g;  Ext- s/p R-TKR, s/p R shoulder surg;  Neuro- neg, intact...  Sput C&S 01/01/16>  Mod PSEUDOMONAS- resist to Cipro/ Levaq and sens- Fortaz, Primaxin, Piperacillin...   CXR 01/28/16>  Norm heart size, hyperinflated w/ chr changes of scarring & bronchiectasis on left- NAD, osteopenia, right shoulder prosthesis, clips over left breast & left axilla  LABS 01/28/16>  Chems- wnl;  CBC- Hg=12.4, otherw wnl... IMP/PLAN>>  It would be additionally beneficial for her to be on a NEB e/ med Tid=Qid before her chest PT/ postural drainage treatments but she is quite sure she cannot tolerate the neb rx;  Her orthopedic issues mount & every surgery/ every general anesthetic takes a toll;  Rec to continue Advair250Bid, Mucinex  1200Bid, fluids, and the chest Rx...  ~  August 05, 2016:  668moOV & Brooklyne's CC is some loose stool occurring every 2-3d, [facility] we discussed Metamucil rx in less water & stop her Mag500-2/d supplement;  Otherw feeling OK sl dry cough at night, small amt gray-green phlegm, no blood, notes SOB/ DOE w/ stairs/ walking; no CP/ palpit, etc on her ex-bike... We reviewed interval Epic notes >>     She had left TKR by DrOlin 03/23/16>  Did well w/ surg & disch w/ [facility] therapy...    She saw GYN-DebLeonard 05/26/16 for her well-woman exam>  [id number], using Yuvafem, no issues...    She saw CARDS-DrNishan 06/11/16>  Hx palpit, atrial bigeminy on office EKG, prev 2DEcho was ok- PAsys=31; no hx MAT or AFib; good control of BP on Cardizem/Losartan;  Felt to be stable- no changes made...  We reviewed the following medical problems during today's office visit >>     Bronchiectasis & HxPneumonia> on Advair250Bid & GFN 1200Bid; she is supposed to be doing Flutter treatments and postural drainage as  well; PFT 9/15 w/ GOLD stage2-B COPD; mod cough, grey colored sput, intermit hemoptysis, denies f/c/s; last pos culture was 4/14= Pseudomonas, pan-sens & treated w/ Cipro at that time; the regular ChestPT which really helps...    Palpitations & PACs> she had PACs & atrial bigeminy; improved on Atenolol25 (but c/o low endurance); followed by Cherly Hensen & changed to CardizemCD120 (she refused 240 dose) + Losar50, last seen 05/2016- no AFib or MAT & no changes made...    CHOL> on diet alone; FLP 6/17 showed TChol 174, TG 64, HDL 62, LDL 100    GI- Divertics> asked to take Align, Metamucil, etc; she had diverticulitis flair in 2012 x2 & treated by DrPatterson w/ Cipro & Flagyl, ok since then.    GU- HxUTI w/ pelvic pressure, back pain, freq&urgency> felt to have a chronic cystitis by DrEskridge & she understands asymptomatic bactiuria...    Hx Breast Cancer> surg 1994 w/ lumpectomy & LN dissection  followed by XRT & Chemorx; she was [facility]; followed by [place]- seen 05/2016 w/ neg exam & mammogram 4/18- OK...    DJD, LBP, Osteopenia> on Tylenol, Estradiol topically per Gyn, calcium, MVI, VitD1000; she walks >79m/d for exercise; followed by DrFields & OAlvan Dame s/p right TKR 12/2014 & left TKR 02/2016; [facility] by [facility]yn.    HxEczema> she uses CoQ10, Borage Oil & black current; she likes herbal remedies...  EXAM shows Afeb, VSS, O2sat=97% on RA;  HEENT- neg, mallampati1;  Chest- rhonchi L>R, w/o w/r/consolidation;  Heart- RR w/o m/r/g;  Abd- soft, nontender, neg;  Ext- s/p R-TKR, decr ROM right shoulder, w/o c/c/e;  Neuro- intact...  Labs 08/05/16>  Chems- ok x Na=130, Cr=0.59, LFTs wnl;  Mg=2.1;  CBC- wnl w/ Hg=13.2, mcv=96;  Fe=169 (46%sat), Ferritin=64;  TSH=2.40;  VitD=40 > same meds, consider some gatorade... IMP/PLAN>>  MMarthenais rec to stop the Mag supplement & add metamucil w/ sm amt water;  We reviewed her vigorous bronchiectasis regimen w/ Advair, GFN, fluids, chest PT & postural drain;  Continue other meds the same & call for any breathing issues...   ~  February 10, 2017:  662moOV & Perry reports that she is "doing great" breathing about the same w/ mild cough, w/ small amt gray sput, no blood, SOB w/ stairs and walking but stable- no deterioration & she continues ex-bike/ floor exercises/ etc; denies CP/ palpit/edema, and no f/c/s...  We reviewed the following interval Epic entries>      [facility]  C/o chest & abd pain- occurred while eating, resolved spont in 1563m Exam was unremarkable, Labs- ok x Na=130, BS=141, and norm CBC/ lipase/ troponin; EKG at baseline w/o acute changes;  CXR showed chr changes in lingula & RML, ?nodule in right mid-lung near prev scarring;  Pt reassured- no additional therapy & referred back to PCP for outpt eval...     She saw GI-DrPyrtle 10/15/16>  S/p surveillance colon 07/2014 w/ 2  small polyps (tub adenomas), mod diverticulosis; noted episode of ?esoph spasm w/ CP & outlined this eval >>            ~Ba Swallow/ UGI 10/21/16>  IMPRESSION: 1. Mild discoordination of swallowing with residue in the vallecula and piriform sinuses after swallows, and with laryngeal penetration of contrast down to the cords. This did not elicit a spontaneous cough response. 2. Mild nonspecific esophageal dysmotility. Partial disruption of primary peristaltic waves in the midthoracic  esophagus. 3. Mild distal esophageal fold thickening may reflect mild esophagitis. No ulceration...           ~EGD 10/29/16 by DrPyrtle>  Mild acid reflux esophagitis, 4cm HH, stomach 7 duodenum- ok...  Rx=> Protonix'40mg'$ /d...            ~Modified Ba Swallow by speech path 11/11/16>  FINDINGS:  Thin liquid- with swallows from a straw, patient demonstrated vallecular and piriform sinus retention. This clears primarily with dry swallows. Single episode of flash penetration.   Nectar thick liquid- vallecular retention.   Pure- within normal limits   Barium tablet -  within normal limits   IMPRESSION:  Mild swallow dysfunction, primarily as evidenced by vallecular and piriform sinus retention. We reviewed the following medical problems during today's office visit>      Bronchiectasis & HxPneumonia> on Advair250Bid & GFN 1200Bid; she is supposed to be doing Flutter treatments and postural drainage as well; PFT 9/15 w/ GOLD stage2-B COPD; mod cough, grey colored sput, intermit hemoptysis, denies f/c/s; last pos culture was 4/14= Pseudomonas, pan-sens & treated w/ Cipro at that time; the regular ChestPT which really helps...    Palpitations & PACs> she had PACs & atrial bigeminy; improved on Atenolol25 (but c/o low endurance); followed by Cherly Hensen & changed to CardizemCD120 (she refused 240 dose) + Losar50, last seen 05/2016- no AFib or MAT & no changes made...    CHOL> on diet alone; FLP 6/17 showed TChol 174, TG 64, HDL 62, LDL 100    GI-  Dysphagia, 4cmHH, esophagitis> eval by DrPyrtle 10/2016=> on Protonix40, + swallowing strategies by speech path    GI- Divertics> asked to take Align, Metamucil, etc; she had diverticulitis flair in 2012 x2 & treated by DrPatterson w/ Cipro & Flagyl, ok since then.    GU- HxUTI w/ pelvic pressure, back pain, freq&urgency> felt to have a chronic cystitis by DrEskridge & she understands asymptomatic bactiuria...    Hx Breast Cancer> surg 1994 w/ lumpectomy & LN dissection followed by XRT & Chemorx; she was [facility]; followed by [place]- seen 05/2016 w/ neg exam & mammogram 4/18- OK...    DJD, LBP, Osteopenia> on Tylenol, Estradiol topically per Gyn, calcium, MVI, VitD1000; she walks >5m/d for exercise; followed by DrFields & OAlvan Dame s/p right TKR 12/2014 & left TKR 02/2016; rt shoulder arthroplasty; [facility].    HxEczema> she uses CoQ10, Borage Oil & black current; she likes herbal remedies...  EXAM shows Afeb, VSS, O2sat=99% on RA;  HEENT- neg, mallampati1;  Chest- rhonchi L>R, w/o w/r/consolidation;  Heart- RR w/o m/r/g;  Abd- soft, nontender, neg;  Ext- s/p R-TKR, decr ROM right shoulder, w/o c/c/e;  Neuro- intact...  CXR 12/19/128 (independently reviewed by me in the PACS system) showed stable heart size, enlarged central PAs, hyperinflation w/ chr fibro-interstitial changes, apical scarring, surg changes on left, chr reticular opac LUL, sm nodular opac right mid zone, no acute dis...  IMP/PLAN>>  MJariyais clinically stable at 870w/ her severe pulm dis- COPD, bronchiectasis, hx pneumonias, post-inflamm scarring;  Rec to continue her ADVAIR Bid, Mucinex '1200mg'$ Bid, Fluids, and an anti-reflux regimen...  ~  April 27, 2017:  39moOV & add-on appt requested for occipital pain in conjunction w/ recent abscessed tooth>  Pt reports pain in the right occipital area x 6wks, then she noted an abscessed tooth in right mandible- tooth was pulled &  abscess drained, treated w/ Augmentin;  She denies f/c/s, visual symptoms, notes hearing is normal & no tinnitus, no focal neuro symptoms other than some dizziness;  She continues to do her resp treatments w/ Advair, GFN, Flutter rx and postural drainage for her bronchiectasis.    Bronchiectasis & HxPneumonia> on Advair250Bid & GFN 1200Bid; she is supposed to be doing Flutter treatments and postural drainage as well; PFT 9/15 w/ GOLD stage2-B COPD; mod cough, grey colored sput, intermit hemoptysis, denies f/c/s; last pos culture was 4/14= Pseudomonas, pan-sens & treated w/ Cipro at that time; the regular ChestPT which really helps...    Palpitations & PACs> she had PACs & atrial bigeminy; improved on Atenolol25 (but c/o low endurance); followed by Cherly Hensen & changed to CardizemCD120 (she refused 240 dose) + Losar50, last seen 05/2016- no AFib or MAT & no changes made...    Hx Breast Cancer> surg 1994 w/ lumpectomy & LN dissection followed by XRT & Chemorx; she was released by [facility] in 2003; followed by [facility]- seen 05/2016 w/ neg exam & mammogram 4/18- OK... EXAM shows Afeb, VSS, O2sat=98% on RA;  HEENT- neg, mallampati1;  Chest- rhonchi L>R, w/o w/r/consolidation;  Heart- RR w/o m/r/g;  Abd- soft, nontender, neg;  Ext- s/p R-TKR, decr ROM right shoulder, w/o c/c/e;  Neuro- intact w/o focal neuro deficits...  LABS 04/27/17>  Chems- wnl x Na=132, Cr=0.56, BS=89, LFTs wnl;  CBC- ok w/ Hg=12.7, WBC=5.4;  TSH=2.67;  Sed=17  CT Head 05/05/17>  Impression: normal for age CT Head (with & without contrast)- cerebral vol has decr since 2004 but is wnl for age; sm dystrophic calcif in cbll nuclei & lentiform nuclei- felt to be inconsequential (no encephalomalacia, no abn enhancement); calcif atherosclerosis at skull base... IMP/PLAN>>  LABS and CT Head are ok- nothing acute, and we decided to treat conservatively w/ rest, OTC analgesics, and time;  She will call if symptoms worsen or are not  improving sufficiently over time...   ~  August 11, 2017:  45moROV & MBerlynnreports doing well, no new complaints or concerns, notes chr stable DOE w/ hiils or stairs but otherw w/ ADLs etc; she notes similar chr cough/ sputum- productive w/ her Advair Bid, GFN, Flutter valve rx & postural drainage    Bronchiectasis & HxPneumonia> on Advair250Bid & GFN 1200Bid; she is supposed to be doing Flutter treatments and postural drainage as well; PFT 9/15 w/ GOLD stage2-B COPD; mod cough, grey colored sput, intermit hemoptysis, denies f/c/s; last pos culture was 4/14= Pseudomonas, pan-sens & treated w/ Cipro at that time; the regular ChestPT which really helps...    Palpitations & PACs> she had PACs & atrial bigeminy; improved on Atenolol25 (but c/o low endurance); followed by DCherly Hensen& changed to CardizemCD120 (she refused 240 dose) + Losar50, last seen 05/2016- no AFib or MAT & no changes made...    Hx Breast Cancer> surg 1994 w/ lumpectomy & LN dissection followed by XRT & Chemorx; [facility] [place] w/ neg exam & mammogram 4/18- OK...    DJD, LBP, Osteopenia> on Tylenol, Estradiol topically per Gyn, calcium, MVI, VitD1000; she walks >159md for exercise; followed by DrFields & OlAlvan Dames/p right TKR 12/2014 & left TKR 02/2016; rt shoulder arthroplasty; [facility]. EXAM shows Afeb, VSS, O2sat=98% on RA;  HEENT- neg, mallampati1;  Chest- rhonchi L>R, w/o w/r/consolidation;  Heart- RR w/o m/r/g;  Abd- soft, nontender, neg;  Ext- s/p  R-TKR, decr ROM right shoulder, w/o c/c/e;  Neuro- intact w/o focal neuro deficits... IMP/PLAN>>  [place] is stable- hx signif bronchiectasis & reminded to stay vigorous on her treatments w/ Advair, GFN, fluids, postural drain & ChestPT; she has also undergone signif orthopedic reconstructive surg w/ bilat TKRs and right shoulder arthroplasty, asked to remains active etc... We plan rov recheck in  about 89moprior to my planned retirement...   ~  January 13, 2018:  89moOV & pulm/medical follow up visit>  [place]ica[place]orts that her breathing is stable- min cough, some beige sput, no hemoptysis, chr stable DOE but she is exercising w/ walking stationary bike, light weights, etc;  She denies f/c/s- he bronchiectasis remains stable on her regimen of Advair Bid, GFN Bid w/ fluids, flutter valve treatments and attempts at postural drainage...     She had a yrly CARDS f/u w/ DrNishan on 09/22/17>  Hx palpit & atrial arrhythmia on CardizemCD120 & Losar50; 2DEcho 2015 w/ norm EF & wall motion, mild MR, PAsys=31; stable, no changes made...    Bronchiectasis & HxPneumonia> on Advair250Bid & GFN 1200Bid; she is supposed to be doing Flutter treatments and postural drainage as well; PFT 9/15 w/ GOLD stage2-B COPD; mod cough, grey colored sput, intermit hemoptysis, denies f/c/s; last pos culture was 4/14= Pseudomonas, pan-sens & treated w/ Cipro at that time; the regular ChestPT which really helps...    Palpitations & PACs> she had PACs & atrial bigeminy; improved on Atenolol25 (but c/o low endurance); followed by DrCherly Hensen changed to CardizemCD120 (she refused 240 dose) + Losar50, last seen 08/2017- no AFib or MAT & no changes made...    Hx Breast Cancer> surg 1994 w/ lumpectomy & LN dissection followed by XRT & Chemorx; [facility]; [facility] [place]/[place] w/ neg exam & mammogram 4/18- OK... EXAM shows Afeb, VSS, O2sat=97% on RA;  HEENT- neg, mallampati1;  Chest- rhonchi L>R, w/o w/r/consolidation;  Heart- RR w/o m/r/g;  Abd- soft, nontender, neg;  Ext- s/p R-TKR, decr ROM right shoulder, w/o c/c/e;  Neuro- intact w/o focal neuro deficits...  LABS 01/12/18>  FLP- TChol 160, TG 46, HDL62, LDL 89;  CMet- wnl x Na=131;  CBC- wnl w/ Hg=12.9 & WBC=5.6, Sed=22;  TSH- 3.09;  UA +UTI w/ WBC, Bact +nitrite... IMP/PLAN>>  Zakeya's pulm status is stable on her current  regimen- continue same;  She has a UTI & we will treat w/ CIPRO;  Reminded to stay as active as poss... we discussed my up-coming retirement & will arrange for her to see DrIcard in 5m29mo so, she has a primary care appt w/ DrJohn in Dec...            Problem List:     ALLERGIC RHINITIS (ICD-477.9) - she uses OTC antihistamines Prn.  BRONCHIECTASIS (ICD-494.0) & Hx of PNEUMONIA (ICD-486) - on ADVAIR 250 Bid, & MUCINEX 1-2Bid...  Long hx severe bronchiectasis, granulomatous lung dis, and pneumonia (initially Dx 1968 by DrFrazier, subseq bx= noncaseating gran dz & chr inflam)... her last bronchoscopy was 4/04 revealing some mucus plugging... CTChest 4/04 showed marked bronchiectasis in lingular, LLL, RML, and mucus plugging... CT repeated 6/06- similar. ~  Sputum cultures 5/08 grew Serratia (R to amox/ceph, S to all others), branching part acid fast bacilli resembling nocardia, yeast c/w candida... ~  CXR 1/09 chr lung dis, no change from prev w/ left mid lung zone as the worst area... ~  CXR 3/10 w/  chr lung changes and NAD suspected... ~  Sputum 8/10 showed NTF only & AFB smears & cult all neg... ~  CXR 3/11 showed chr lung dis, no acute changes, stable... ~  CXR 3/12 showed chronic changes, scarring, NAD.Marland Kitchen. ~  CXR 3/13 showed essentially no change> normal heart size, prom pulm arts, bilat interstitial prominence & nodularity, bronchiectasis, NAD, surg clips from prev left breast ca surg. ~  9/13: she presents w/ an acute exac w/ cough, thick sput, congestion, fever, SOB, & chest discomfort; CXR shows worse left mid zone opacity; We decided to treat w/ empiric Levaquin x10d pending cult report==> ret w/ resist Pseudomonas, but pt states better/ improved on the Levaquin. ~  Sputum C&S 4/14 showed Pseudomonas sens to Cefepime, Tressie Ellis, Cipro, Levaquin, etc ~  3/15 & 9/15: on Advair250Bid (not taking regularly) & GFN 1200Bid; mod cough, grey colored sput, intermit hemoptysis, denies f/c/s; last pos  culture was 4/14= Pseudomonas, pan-sens & treated w/ Cipro. ~  CXR 3/15 showed stable film w/ scarring LUL, no change and surg clips left breast & axilla... ~  PFTs 9/15 showed FVC= 2.08 (77%), FEV1= 1.22 (61%), %1sec= 59%, mid-flows= 31% predicted... c/w GOLD Stage2 COPD, pt GroupB... ~  3/16: on Advair250Bid & GFN 1200Bid (asked to take regularly); PFT 9/15 w/ GOLD stage2-B COPD; mod cough, grey colored sput, intermit hemoptysis, denies f/c/s; last pos culture was 4/14= Pseudomonas, pan-sens & treated w/ Cipro at that time; she does regular ChestPT... ~  CXR 3/16 is stable- chronic changes on left, clips from prev left breast surg, DDD in Tspine ~  9/16: she is reminded to use the Advair250Bid & the Mucinex600-2Bid w/ fluids and maintain a vigorous chest PT & postural drainage regimen...  PALPITATIONS >> followed by Barton Fanny on Atenolol25 & improved... ~  2DEcho 3/15 showed norm LV size & function w/ EF=55-60% and no regional wall motion abn, mild MR, mild TR, PAsys=1mHg... ~  9/16: she reminds me that daughter does not want her on Atenolol; DrNishan ordered CardizemCD240 but she "only wants low dose meds"; therefore try CardizemCD120/d... ~  10/16: pt returns w/ extensive BP records on her CardizemCD120/d; BP today= 132/74 but BP at home up to 160/100+ and we decided to add LOSATAN '50mg'$ /d to her regimen...   HYPERCHOLESTEROLEMIA, BORDERLINE (ICD-272.4) - on diet alone... ~  FLP 9/10 showed TChol 174, TG 64, HDL 49, LDL 112 ~  FLP 9/11 showed TChol 175, TG 61, HDL 48, LDL 115 ~  FLP 3/13 showed TChol 176, TG 69, HDL 64, LDL 98 ~  FLP 9/13 showed TChol 169, TG 65, HDL 48, LDL 108 ~  FLP 3/14 showed TChol 157, TG 54, HDL 46, LDL 100 ~  FLP 3/15 showed TChol 172, TG 50, HDL 56, LDL 106 ~  FLP 3/16 showed TChol 163, TG 70, HDL 53, LDL 96  Hx of ADENOCARCINOMA, BREAST (ICD-174.9) - [facility] (stage II 1.0  cm tumor), w/ post  op XRT and chemotherapy... [facility] in [facility]). ~  routine Mammogram 2/10 was neg- f/u planned 1414yr~  f/u mammogram 2/11 = neg, NAD, f/u planned 1y59yr  f/u mammogram 2/12 = neg, NAD, f/u 14yr53yr f/u mammogram 2/13 = neg, NAD... ~Marland Kitchen f/u mammogram 2/14 = neg, scat parenchymal densities... ~  [facility] ~[facility]....  DIVERTICULOSIS OF COLON (ICD-562.10) - colonoscopy 3/05 by DrPatterson showed divertics only... she has +fam hx colon ca w/ colon check q84yr... f/u colonoscopy 5/11 showed +divertics, no polyps... ~  3/12:  Hx, exam & CTAbd c/w diverticulitis; treated w/ Cipro/ Flagyl & resolved;  Advised Metamucil, Miralax, etc. ~  11/12:  She had f/u DrPatterson after another bout of diverticulitis; he notes regular BMs, good appetite, stable weight; he wanted her to STOP Naprosyn, ok to use Tylenol/ Tramadol, rec hi fiber, saw the DNew Cambriamovie. ~  She had a routine f/u colonoscopy 08/08/14 by DrPyrtle> +FamHx colon cancer in father; 2 sessile polyps removed, 3-558msize, mod diverticulosis, Path= tubular adenomas...  DEGENERATIVE JOINT DISEASE (ICD-715.90) - see Ortho eval 2010 by DrDaldorf... she takes Glucosamine & Herbal supplements- milk thistle/ tumeric which she says really helps... ~  4/14: she saw DrKarlFields for SportsMed> c/o bilat knee pain, known arthritis, uses body helix knee (compression) sleeves & Tramadol prn; takes tart red cherry oil daily & he rec devil's claw, tumeric, & curcumin... ~  3/15: she continues to f/u w/ drFields for knee shots periodically... ~  3/16: she is requesting name of Ortho for poBelknap. ~  She is managed by DrFields and DrOlin => told she needs TKRs & we will send medical clearance...  LOW BACK PAIN SYNDROME (ICD-724.2) - [facility] showed herniated disc L3-4 w/ mod spinal stenosis and  lat recess stenosis bilat...   ROTATOR CUFF SYNDROME, RIGHT (ICD-726.10) - [facility].  OSTEOPENIA (ICD-733.90) - [facility], norm hip, osteopenic forearm (-2.0)... followed by GYN = DrRomaine who is treating a low Vit D level... she takes calcium, Vitamins, & Vit D 1000u daily from DrEton. ~  labs 9/10 on 50K weekly showed Vit D level = 42... rec> change to 1000 u daily. ~  labs 9/11 on 1000 u daily showed Vit D level = 34... rec> continue daily supplement. ~  Labs 3/13 showed Vit D level = 35... Continue supplements. ~  Labs 3/14 showed Vit D level = 31 ~  Labs 3/15 showed Vit D level = 49... Continue supplements  Hx of ANEMIA (ICD-285.9) -  resolved... ~  labs 9/10 showed Hg= 13.6 ~  labs 9/11 showed Hg= 13.2 ~  Labs 3/13 showed Hg= 13.4 ~  Labs 3/14 showed Hg= 13.3 ~  Labs 3/15 showed Hg= 13.4 ~  Labs 3/16 showed Hg= 13.1  ECZEMA - she has hx of eczema on her hands and has used CoQ10 and Borage Oil as rec by an heAdministratorn [place]. recently changed to BlSt. Elizabeth Owenurrent instead of these other supplements & rash is much improved...   Past Surgical History:  Procedure Laterality Date  . COLONOSCOPY    . left breast lumpectomy and LN dissection  06/1992   Dr. NeLucia Gaskins. LUNG BIOPSY  1968   TSurg---Dr. FrMare Ferrari. LUNG BIOPSY  2012   dr naLenna Gilford. POLYPECTOMY    . REVERSE SHOULDER ARTHROPLASTY Right 08/02/2015   Procedure: RIGHT REVERSE TOTAL SHOULDER ARTHROPLASTY;  Surgeon: StNetta CedarsMD;  [facility]: [facility] Service: Orthopedics;  Laterality: Right;  . REVERSE TOTAL SHOULDER ARTHROPLASTY Right 08/02/2015  . TONSILLECTOMY    . TOTAL KNEE ARTHROPLASTY Right 01/21/2015   Procedure: TOTAL RIGHT KNEE ARTHROPLASTY;  Surgeon: MaParalee CancelMD;  Location: [facility];  Service: Orthopedics;  Laterality:  Right;  Marland Kitchen TOTAL KNEE ARTHROPLASTY Left 03/23/2016   Procedure: LEFT TOTAL KNEE ARTHROPLASTY;  Surgeon: [name], MD;   Location: [facility];  Service: Orthopedics;  Laterality: Left;    Outpatient Encounter Medications as of 01/13/2018  Medication Sig  . Calcium Carb-Cholecalciferol 600-800 MG-UNIT TABS Take 1 tablet by mouth daily.  . cholecalciferol (VITAMIN D) 1000 UNITS tablet Take 1,000 Units by mouth daily.  Marland Kitchen diltiazem (CARDIZEM CD) 120 MG 24 hr capsule TAKE 1 CAPSULE (120 MG TOTAL) BY MOUTH DAILY.  Marland Kitchen Estradiol (YUVAFEM) 10 MCG TABS vaginal tablet Place 1 tablet (10 mcg total) vaginally 2 (two) times a week.  . Fluticasone-Salmeterol (ADVAIR) 250-50 MCG/DOSE AEPB Inhale 1 puff into the lungs daily.  . Guaifenesin 1200 MG TB12 Take 600 mg by mouth 2 (two) times daily.   Marland Kitchen losartan (COZAAR) 50 MG tablet Take 1 tablet (50 mg total) by mouth daily.  . Multiple Vitamins-Minerals (MULTIVITAMIN & MINERAL PO) Take 1 tablet by mouth daily.    . [DISCONTINUED] Saccharomyces boulardii (FLORASTOR PO) Take by mouth.  . ciprofloxacin (CIPRO) 500 MG tablet Take 1 tablet (500 mg total) by mouth 2 (two) times daily.  . pantoprazole (PROTONIX) 40 MG tablet TAKE 1 TABLET BY MOUTH EVERY DAY (Patient not taking: Reported on 01/13/2018)   No facility-administered encounter medications on file as of 01/13/2018.     No Known Allergies    Immunization History  Administered Date(s) Administered  . Influenza Split 12/03/2011  . Influenza Whole 11/30/2007, 11/30/2008, 12/03/2009, 11/24/2010  . Influenza, High Dose Seasonal PF 11/19/2015, 12/02/2016, 12/17/2017  . Influenza,inj,Quad PF,6+ Mos 11/10/2012, 11/08/2013, 11/06/2014  . Influenza-Unspecified 12/02/2016  . Pneumococcal Conjugate-13 05/09/2014  . Pneumococcal Polysaccharide-23 11/07/2007  . Tdap 05/09/2014    Current Medications, Allergies, Past Medical History, Past Surgical History, Family History, and Social History were reviewed in [employer] record.     Review of Systems         See HPI - all other systems neg except as noted...   The patient complains of dyspnea on exertion and abdominal pain.  The patient denies anorexia, fever, weight loss, weight gain, vision loss, decreased hearing, hoarseness, chest pain, syncope, peripheral edema, prolonged cough, headaches, hemoptysis, melena, hematochezia, severe indigestion/heartburn, hematuria, incontinence, muscle weakness, suspicious skin lesions, transient blindness, difficulty walking, depression, unusual weight change, abnormal bleeding, enlarged lymph nodes, and angioedema.     Objective:   Physical Exam     WD, WN, 84 y/o WF in NAD... GENERAL:  Alert & oriented; pleasant & cooperative... HEENT:  [place]/AT, EOM-wnl, PERRLA, EACs-clear, TMs-wnl, NOSE-clear, THROAT-clear & wnl. NECK:  Supple w/ fairROM; no JVD; normal carotid impulses w/o bruits; no thyromegaly or nodules palpated; no lymphadenopathy. CHEST:  scat bilat rhonchi/ congestion without wheezing, rales or signs of consolidation... HEART:  Regular Rhythm; without murmurs/ rubs/ or gallops heard... ABDOMEN:  Soft w/ mild tender LLQ; normal bowel sounds; no organomegaly or masses detected. EXT: without deformities or arthritic changes; no varicose veins/ venous insuffic/ or edema. NEURO:  CN's intact;  no focal neuro deficits... DERM:  No lesions noted; no rash etc...  RADIOLOGY DATA:  Reviewed in the EPIC EMR & discussed w/ the patient...  LABORATORY DATA:  Reviewed in the EPIC EMR & discussed w/ the patient...    Assessment & Plan:    Occipital HA and recent Tooth abscess>>  04/27/17>   LABS and CT Head are ok- nothing acute, and we decided to treat conservatively w/ rest, OTC analgesics,  and time;  She will call if symptoms worsen or are not improving sufficiently over time...   COPD/ Bronchiectasis/ Hx pneumonia>  On Advair250, Mucinex2Bid, Fluids & vigorous Chest PT/ postural drainage... 9/13> treating acute exac w/ Levaquin x10d; Cult grew a resist Pseudomonas but she reports clinically improved, therefore  continue present Rx & follow...  Repeat Sput C&S 4/14 showed pansens Pseudomonas... Baseline CXRs showed CXR 3/15 & 3/16 showed scarring LUL, no change, and surg clips left breast & axilla... PFTs 9/15 showed FVC= 2.08 (77%), FEV1= 1.22 (61%), %1sec= 59%, mid-flows= 31% predicted... c/w GOLD Stage2 COPD, pt GroupB... REMINDED TO TAKE MEDS REGULARLY & MAINTAIN A VIGOROUS REGIMEN OF CHEST PT & POSTURAL DRAINAGE... 07/2015> she developed a bronchiectasis exac after R shoulder surg when she was no longer able to do her chest Pt & postural drain=> cult grew pseudomonas sens to cipro=> treated w/ this + home NEB using DUONEB Tid... 02/03/16>  It would be additionally beneficial for her to be on a NEB w/ med Tid-Qid before her chest PT/ postural drainage treatments but she is quite sure she cannot tolerate the neb rx;  Her orthopedic issues mount & every surgery/ every general anesthetic takes a toll;  Rec to continue Advair250Bid, Mucinex 1200Bid, fluids, and the chest Rx. 08/05/16>   Monette is rec to stop the Mag supplement & add metamucil w/ sm amt water;  We reviewed her vigorous bronchiectasis regimen w/ Advair, GFN, fluids, chest PT & postural drain;  Continue other meds the same & call for any breathing issues. 02/10/17>   Korina is clinically stable at 63 w/ her severe pulm dis- COPD, bronchiectasis, hx pneumonias, post-inflamm scarring;  Rec to continue her ADVAIR Bid, Mucinex '1200mg'$ Bid, Fluids, and an anti-reflux regimen... 08/11/17>   Iyah is stable- hx signif bronchiectasis & reminded to stay vigorous on her treatments w/ Advair, GFN, fluids, postural drain & ChestPT; she has also undergone signif orthopedic reconstructive surg w/ bilat TKRs and right shoulder arthroplasty, asked to remains active etc... We plan rov recheck in about 43moprior to my planned retirement. 01/13/18>   Syniah's pulm status is stable on her current regimen- continue same;  She has a UTI & we will treat w/ CIPRO;  Reminded  to stay as active as poss... we discussed my up-coming retirement & will arrange for her to see DrIcard in 345mor so, she has a primary care appt w/ DrJohn in Dec  HBP & palpit (PACs)>  followed by DrBurkina Fasoon CardizemCD120 & she does not want BBlockers, & only wants "low-dose" meds... 10/16>   BP at home variable w/ 160/100+ episodes so we decided to add low dose LOSARTAN '50mg'$ /d... 1/17>   BP much better w/ the Losar50 added- continue same...   CHOL>  On diet alone & f/u FLP is at goal...  Breast Cancer>  [facility]/u by DrNeijstrom, then [place] 2003 & released; yearly mammograms & monthly self exams were wnl...  Divertics>  She had acute diverticulitis 3/12 & 10/12> resolved w/ diet adjust & Cipro/Flagyl, now back to baseline & advised re- Metamucil, Fiberall, etc; she saw DrPatterson 11/12.  DJD/ LBP>  Ortho is DrOlin & DrFields; she takes Glucosamine & Herbs;  11/16>   s/p right TKR 12/2014 by DrOlin 6/17>   sched for right total shoulder replacement by DrNorris  Osteopenia/ Vit D defic>  On Calcium, MVI, Vit D supplement; followed by GYN= DrRomaine...  Other medical issues as noted...Marland KitchenMarland Kitchen  Patient's Medications  New Prescriptions   CIPROFLOXACIN (CIPRO) 500 MG TABLET    Take 1 tablet (500 mg total) by mouth 2 (two) times daily.  Previous Medications   CALCIUM CARB-CHOLECALCIFEROL 600-800 MG-UNIT TABS    Take 1 tablet by mouth daily.   CHOLECALCIFEROL (VITAMIN D) 1000 UNITS TABLET    Take 1,000 Units by mouth daily.   DILTIAZEM (CARDIZEM CD) 120 MG 24 HR CAPSULE    TAKE 1 CAPSULE (120 MG TOTAL) BY MOUTH DAILY.   ESTRADIOL (YUVAFEM) 10 MCG TABS VAGINAL TABLET    Place 1 tablet (10 mcg total) vaginally 2 (two) times a week.   FLUTICASONE-SALMETEROL (ADVAIR) 250-50 MCG/DOSE AEPB    Inhale 1 puff into the lungs daily.   GUAIFENESIN 1200 MG TB12    Take 600 mg by mouth 2 (two) times daily.    LOSARTAN (COZAAR) 50 MG TABLET    Take 1 tablet (50 mg total) by  mouth daily.   MULTIPLE VITAMINS-MINERALS (MULTIVITAMIN & MINERAL PO)    Take 1 tablet by mouth daily.     PANTOPRAZOLE (PROTONIX) 40 MG TABLET    TAKE 1 TABLET BY MOUTH EVERY DAY  Modified Medications   No medications on file  Discontinued Medications   SACCHAROMYCES BOULARDII (FLORASTOR PO)    Take by mouth.

## 2018-01-13 NOTE — Patient Instructions (Signed)
Today we updated your med list in our EPIC system...    Continue your current medications the same...  For your UTI I have prescribed CIPRO 500mg  one tab twice daily til gone...  We reviewed your recent lab work & gave you a copy for your records...   Continue the Advair twice daily, the Guaifenesin & fluids... Continue the FLUTTER valve treatmernts and your postural efforts to drain your lungs & expectorate the sputum...  We discussed my up-coming retirement & we will arrange for a f/u pulmonary appt w/ my young partner, Dr. Valeta Harms, in about 3 months... You are already established w/ DrJohn for primary care...  Miranda Thompson,  It has been my great honor to have been one of your doctors over these many yrs...    Wishing you & your family good health & much happiness in the years to come!!!

## 2018-01-21 ENCOUNTER — Encounter: Payer: Self-pay | Admitting: Pulmonary Disease

## 2018-01-21 NOTE — Telephone Encounter (Signed)
SN please advise on patients e-mail below. Thank you.

## 2018-01-24 ENCOUNTER — Other Ambulatory Visit: Payer: Self-pay | Admitting: Pulmonary Disease

## 2018-01-24 DIAGNOSIS — J479 Bronchiectasis, uncomplicated: Secondary | ICD-10-CM

## 2018-02-11 ENCOUNTER — Encounter: Payer: Self-pay | Admitting: Pulmonary Disease

## 2018-02-14 ENCOUNTER — Encounter: Payer: Self-pay | Admitting: Internal Medicine

## 2018-02-14 ENCOUNTER — Ambulatory Visit (INDEPENDENT_AMBULATORY_CARE_PROVIDER_SITE_OTHER): Payer: Medicare Other | Admitting: Internal Medicine

## 2018-02-14 ENCOUNTER — Telehealth: Payer: Self-pay

## 2018-02-14 VITALS — BP 138/84 | HR 94 | Temp 97.8°F | Ht 63.0 in | Wt 130.0 lb

## 2018-02-14 DIAGNOSIS — Z0000 Encounter for general adult medical examination without abnormal findings: Secondary | ICD-10-CM

## 2018-02-14 DIAGNOSIS — H409 Unspecified glaucoma: Secondary | ICD-10-CM | POA: Insufficient documentation

## 2018-02-14 DIAGNOSIS — K219 Gastro-esophageal reflux disease without esophagitis: Secondary | ICD-10-CM

## 2018-02-14 DIAGNOSIS — M810 Age-related osteoporosis without current pathological fracture: Secondary | ICD-10-CM | POA: Diagnosis not present

## 2018-02-14 DIAGNOSIS — Z0001 Encounter for general adult medical examination with abnormal findings: Secondary | ICD-10-CM | POA: Insufficient documentation

## 2018-02-14 DIAGNOSIS — Z800 Family history of malignant neoplasm of digestive organs: Secondary | ICD-10-CM

## 2018-02-14 HISTORY — DX: Gastro-esophageal reflux disease without esophagitis: K21.9

## 2018-02-14 HISTORY — DX: Family history of malignant neoplasm of digestive organs: Z80.0

## 2018-02-14 NOTE — Assessment & Plan Note (Signed)

## 2018-02-14 NOTE — Progress Notes (Signed)
Subjective:    Patient ID: Miranda Thompson, female    DOB: [birth year], 84 y.o.   MRN: [id number]  HPI Here for wellness and f/u;  Overall doing ok;  Pt denies Chest pain, worsening SOB, DOE, wheezing, orthopnea, PND, worsening LE edema, palpitations, dizziness or syncope.  Pt denies neurological change such as new headache, facial or extremity weakness.  Pt denies polydipsia, polyuria, or low sugar symptoms. Pt states overall good compliance with treatment and medications, good tolerability, and has been trying to follow appropriate diet.  Pt denies worsening depressive symptoms, suicidal ideation or panic. No fever, night sweats, wt loss, loss of appetite, or other constitutional symptoms.  Pt states good ability with ADL's, has low fall risk, home safety reviewed and adequate, no other significant changes in hearing or vision, and only occasionally active with exercise.  Had recent DXA with t score < -3.0, and hx of low vit d Wt Readings from Last 3 Encounters:  02/14/18 130 lb (59 kg)  01/13/18 131 lb (59.4 kg)  09/22/17 135 lb (61.2 kg)   BP Readings from Last 3 Encounters:  02/14/18 138/84  01/13/18 124/62  09/22/17 112/60  Has new dx glaucoma from last wk per Dr Katy Fitch, may need cataract surgury soon.   No new complaints Past Medical History:  Diagnosis Date  . Adenomatous colon polyp   . Allergic rhinitis   . Anemia    pt states no anemia in her past hx thst she is aware of   . Atrial fibrillation ([place])    goes in and out of this rhythm- takes Tenormin  . Breast cancer, left breast ([place]) 1994 with lumpectomy   chemo,tamox  . Bronchiectasis with acute exacerbation ([place])   . COPD (chronic obstructive pulmonary disease) ([place])   . Diverticulosis of colon   . DJD (degenerative joint disease)   . Dysrhythmia    palpitations  . Family history of colon cancer 02/14/2018  . Family history of malignant neoplasm of gastrointestinal tract   . Fibromyalgia   . GERD (gastroesophageal  reflux disease) 02/14/2018  . Hemoptysis   . History of pneumonia   . Hypercholesteremia    pt denies  . Hypertension   . Low back pain syndrome   . Neuromuscular disorder ([facility])    hx fibromyalgia   . Osteopenia   . Pneumonia    hx  . Pseudomonas infection   . Rotator cuff syndrome of right shoulder   . Spinal stenosis    Past Surgical History:  Procedure Laterality Date  . COLONOSCOPY    . left breast lumpectomy and LN dissection  06/1992   Dr. [name]  . LUNG BIOPSY  1968   TSurg---Dr. [name]  . LUNG BIOPSY  2012   dr [name]  . POLYPECTOMY    . REVERSE SHOULDER ARTHROPLASTY Right 08/02/2015   Procedure: RIGHT REVERSE TOTAL SHOULDER ARTHROPLASTY;  Surgeon: [name], MD;  Location: [facility];  Service: Orthopedics;  Laterality: Right;  . REVERSE TOTAL SHOULDER ARTHROPLASTY Right 08/02/2015  . TONSILLECTOMY    . TOTAL KNEE ARTHROPLASTY Right 01/21/2015   Procedure: TOTAL RIGHT KNEE ARTHROPLASTY;  Surgeon: [name], MD;  Location: [facility];  Service: Orthopedics;  Laterality: Right;  . TOTAL KNEE ARTHROPLASTY Left 03/23/2016   Procedure: LEFT TOTAL KNEE ARTHROPLASTY;  Surgeon: [name], MD;  Location: [facility];  Service: Orthopedics;  Laterality: Left;    reports that she quit smoking about 51 years ago. Her smoking use included cigarettes. She has  a 10.00 pack-year smoking history. She has never used smokeless tobacco. She reports current alcohol use of about 1.0 standard drinks of alcohol per week. She reports that she does not use drugs. family history includes Colon cancer in her father; Colon polyps in her daughter; Heart attack in her mother; Hypertension in her brother. No Known Allergies Current Outpatient Medications on File Prior to Visit  Medication Sig Dispense Refill  . Calcium Carb-Cholecalciferol 600-800 MG-UNIT TABS Take 1 tablet by mouth daily.    . cholecalciferol (VITAMIN D) 1000 UNITS tablet Take 1,000 Units by mouth daily.    Marland Kitchen diltiazem (CARDIZEM CD) 120  MG 24 hr capsule TAKE 1 CAPSULE (120 MG TOTAL) BY MOUTH DAILY. 90 capsule 3  . Estradiol (YUVAFEM) 10 MCG TABS vaginal tablet Place 1 tablet (10 mcg total) vaginally 2 (two) times a week. 8 tablet 11  . Fluticasone-Salmeterol (ADVAIR) 250-50 MCG/DOSE AEPB Inhale 1 puff into the lungs daily. 60 each 5  . Guaifenesin 1200 MG TB12 Take 600 mg by mouth 2 (two) times daily.     Marland Kitchen losartan (COZAAR) 50 MG tablet Take 1 tablet (50 mg total) by mouth daily. 90 tablet 3  . Multiple Vitamins-Minerals (MULTIVITAMIN & MINERAL PO) Take 1 tablet by mouth daily.       No current facility-administered medications on file prior to visit.    Review of Systems Constitutional: Negative for other unusual diaphoresis, sweats, appetite or weight changes HENT: Negative for other worsening hearing loss, ear pain, facial swelling, mouth sores or neck stiffness.   Eyes: Negative for other worsening pain, redness or other visual disturbance.  Respiratory: Negative for other stridor or swelling Cardiovascular: Negative for other palpitations or other chest pain  Gastrointestinal: Negative for worsening diarrhea or loose stools, blood in stool, distention or other pain Genitourinary: Negative for hematuria, flank pain or other change in urine volume.  Musculoskeletal: Negative for myalgias or other joint swelling.  Skin: Negative for other color change, or other wound or worsening drainage.  Neurological: Negative for other syncope or numbness. Hematological: Negative for other adenopathy or swelling Psychiatric/Behavioral: Negative for hallucinations, other worsening agitation, SI, self-injury, or new decreased concentration All other system neg per pt    Objective:   Physical Exam BP 138/84   Pulse 94   Temp 97.8 F (36.6 C) (Oral)   Ht 5\' 3"  (1.6 m)   Wt 130 lb (59 kg)   LMP 02/23/1985   SpO2 96%   BMI 23.03 kg/m  VS noted,  Constitutional: Pt is oriented to person, place, and time. Appears well-developed  and well-nourished, in no significant distress and comfortable Head: Normocephalic and atraumatic  Eyes: Conjunctivae and EOM are normal. Pupils are equal, round, and reactive to light Right Ear: External ear normal without discharge Left Ear: External ear normal without discharge Nose: Nose without discharge or deformity Mouth/Throat: Oropharynx is without other ulcerations and moist  Neck: Normal range of motion. Neck supple. No JVD present. No tracheal deviation present or significant neck LA or mass Cardiovascular: Normal rate, regular rhythm, normal heart sounds and intact distal pulses.   Pulmonary/Chest: WOB normal and breath sounds without rales or wheezing  Abdominal: Soft. Bowel sounds are normal. NT. No HSM  Musculoskeletal: Normal range of motion. Exhibits no edema Lymphadenopathy: Has no other cervical adenopathy.  Neurological: Pt is alert and oriented to person, place, and time. Pt has normal reflexes. No cranial nerve deficit. Motor grossly intact, Gait intact Skin: Skin is warm and dry.  No rash noted or new ulcerations Psychiatric:  Has normal mood and affect. Behavior is normal without agitation No other exam findings  Lab Results  Component Value Date   WBC 5.6 01/12/2018   HGB 12.9 01/12/2018   HCT 37.7 01/12/2018   PLT 245.0 01/12/2018   GLUCOSE 101 (H) 01/12/2018   CHOL 160 01/12/2018   TRIG 46.0 01/12/2018   HDL 62.00 01/12/2018   LDLCALC 89 01/12/2018   ALT 12 01/12/2018   AST 20 01/12/2018   NA 131 (L) 01/12/2018   K 4.2 01/12/2018   CL 97 01/12/2018   CREATININE 0.71 01/12/2018   BUN 20 01/12/2018   CO2 28 01/12/2018   TSH 3.09 01/12/2018   INR 1.06 01/14/2015      Assessment & Plan:

## 2018-02-14 NOTE — Telephone Encounter (Signed)
-----   Message from [name], MD sent at 02/14/2018  3:25 PM EST ----- Regarding: new prolia Please to start prolia , thanks

## 2018-02-14 NOTE — Telephone Encounter (Signed)
Patient's insurance will be verified in 2020, I will call patient to discuss summary of benefits

## 2018-02-14 NOTE — Patient Instructions (Signed)
I will send a note to Jonelle Sidle (chief nurse in the office) to see about starting Prolia   Please continue all other medications as before, and refills have been done if requested.  Please have the pharmacy call with any other refills you may need.  Please continue your efforts at being more active, low cholesterol diet, and weight control.  You are otherwise up to date with prevention measures today.  Please keep your appointments with your specialists as you may have planned  Please return in 1 year for your yearly visit, or sooner if needed, with Lab testing done 3-5 days before

## 2018-02-14 NOTE — Assessment & Plan Note (Signed)
Ok for prolia, will ask Marcie Bal to assist with start

## 2018-04-05 ENCOUNTER — Other Ambulatory Visit: Payer: Self-pay | Admitting: Pulmonary Disease

## 2018-04-15 ENCOUNTER — Telehealth: Payer: Self-pay | Admitting: Certified Nurse Midwife

## 2018-04-15 ENCOUNTER — Encounter: Payer: Self-pay | Admitting: Certified Nurse Midwife

## 2018-04-15 ENCOUNTER — Ambulatory Visit (INDEPENDENT_AMBULATORY_CARE_PROVIDER_SITE_OTHER): Payer: Medicare Other | Admitting: Certified Nurse Midwife

## 2018-04-15 ENCOUNTER — Other Ambulatory Visit: Payer: Self-pay

## 2018-04-15 VITALS — BP 120/60 | HR 72 | Temp 97.8°F | Resp 16 | Wt 132.0 lb

## 2018-04-15 DIAGNOSIS — R319 Hematuria, unspecified: Secondary | ICD-10-CM

## 2018-04-15 DIAGNOSIS — N3001 Acute cystitis with hematuria: Secondary | ICD-10-CM | POA: Diagnosis not present

## 2018-04-15 DIAGNOSIS — B3731 Acute candidiasis of vulva and vagina: Secondary | ICD-10-CM

## 2018-04-15 DIAGNOSIS — N390 Urinary tract infection, site not specified: Secondary | ICD-10-CM

## 2018-04-15 DIAGNOSIS — B373 Candidiasis of vulva and vagina: Secondary | ICD-10-CM

## 2018-04-15 LAB — POCT URINALYSIS DIPSTICK
Bilirubin, UA: NEGATIVE
Glucose, UA: NEGATIVE
Ketones, UA: NEGATIVE
Nitrite, UA: NEGATIVE
Protein, UA: NEGATIVE
Urobilinogen, UA: NEGATIVE E.U./dL — AB
pH, UA: 5 (ref 5.0–8.0)

## 2018-04-15 MED ORDER — CIPROFLOXACIN HCL 500 MG PO TABS: 500.0000 mg | ORAL_TABLET | Freq: Two times a day (BID) | ORAL | 0 refills | Status: DC

## 2018-04-15 NOTE — Telephone Encounter (Signed)
Left message to call Sharee Pimple, RN at [facility].   MyChart message to patient.   Routing to [employer], CNM FYI

## 2018-04-15 NOTE — Telephone Encounter (Signed)
Spoke with patient. Patient reports urinary frequency, urgency and cloudy urine with odor for 1wk. Denies blood in urine, lower back pain, fever/chills, N/V, vaginal d/c. Patient will be traveling later today, requesting RX for UTI. Advised OV needed for further evaluation, OV scheduled for today at 4pm with Melvia Heaps, CNM. Patient verbalizes understanding and is agreeable.   Encounter closed.

## 2018-04-15 NOTE — Progress Notes (Signed)
85 y.o. Married Caucasian female [phone] here with complaint of UTI, with onset  on 1 week ago.. Patient complaining of urinary frequency/urgency/ and pain with urination. Patient denies fever, chills, nausea or back pain. Last UTI 3/19. No new personal products. Patient feels may be related to riding bike and wearing poise pad. Also complaining of itching in vaginal area, "feels like yeast".    . Menopausal with vaginal dryness using Olive oil with good results. Patient trying to consume  adequate water intake, daily. No other problems today. Cipro is only thing that has worked for her in past.   Review of Systems  Constitutional: Positive for malaise/fatigue. Negative for chills and fever.  HENT: Negative.   Eyes: Negative.   Respiratory: Negative.   Cardiovascular: Negative.   Genitourinary: Positive for dysuria, frequency and urgency. Negative for flank pain and hematuria.  Musculoskeletal: Negative.   Skin: Positive for itching.       Vaginal only  Neurological: Negative.   Endo/Heme/Allergies: Negative.   Psychiatric/Behavioral: Negative.     O: Healthy female WDWN Affect: Normal, orientation x 3 Skin : warm and dry CVAT: negative bilateral Abdomen: positive for suprapubic tenderness  Pelvic exam: External genital area:atrophic with redness noted no lesions Bladder,Urethra tender, Urethral meatus: tender, red Vagina: scant white vaginal discharge, normal appearance  Wet prep taken Cervix: normal, non tender Uterus:normal,non tender Adnexa: normal non tender, no fullness or masses Wet Prep: Koh, Saline + yeast only  A: UTI Normal pelvic exam poct urine-wbc tr, rbc tr Yeast vaginitis  P: Reviewed findings of UTI and need for treatment. YY:TKPTW bid x 3  SFK:CLEXN:, culture Reviewed warning signs and symptoms of UTI and need to advise if occurring. Encouraged to limit soda, tea, and coffee and be sure to increase water intake. Discussed yeast vaginitis and vulvitis  finding. Patient prefers OTC medication. Recommended Monistat 7 follow instructions as printed. Call if issues or symptoms persist.   RV prn

## 2018-04-15 NOTE — Telephone Encounter (Signed)
Patient sent the following correspondence through [place]. Routing to triage to assist patient with request.  Hi [name],     I hope you have been fine, all good here until a few days ago when some of my UTI symptoms started like horrible odor, frequency of urination, itching, cloudiness and some pelvic discomfort.     It has been a busy week, I have been driving [place] to oral surgery appointments , etc., (just one car) and have been wishing it was not true, I was wondering if you might consider prescribing medication in lieu of a visit, it just so happens that my daughter is flying in from [place] and we have to get on to [place] where we are giving a wedding shower tonight.  We will be back mid to late morning Sat and I could pick up the prescription then.    If you are not willing to do that, I can come in next week for an exam.    Thank you and have a pleasant weekend.    Lorelee New

## 2018-04-18 ENCOUNTER — Telehealth: Payer: Self-pay | Admitting: Certified Nurse Midwife

## 2018-04-18 LAB — URINE CULTURE

## 2018-04-18 MED ORDER — FLUCONAZOLE 150 MG PO TABS: ORAL_TABLET | ORAL | 0 refills | Status: DC

## 2018-04-18 NOTE — Telephone Encounter (Signed)
Call to patient. Per ROI, can leave message on home voice mail.  Left message medication has been called to pharmacy. Call back if any questions or if not improved.    Encounter closed.

## 2018-04-18 NOTE — Telephone Encounter (Signed)
Patient has been using vagisil for itching and it is not working.

## 2018-04-18 NOTE — Telephone Encounter (Signed)
Spoke with patient.  Visit with Miranda Thompson CNM on Friday. Treated with Cipro for UTI. Culture resulted with + Ecoli.  Today calling with vaginal irritation and itching. Feels like "fire ants". Using vagisil but only helps with itch relief temporarily. She states she couldn't find Monistat over the counter.  Asking if [name] would send in the "pill for yeast" they discussed.  Had yeast on wet prep.  She will continue to look for Monistat at the store.  Routing to Dr. Sabra Heck for Rx foir yeast.

## 2018-04-18 NOTE — Telephone Encounter (Signed)
Ok to treat with Diflucan 150mg  po x 1, repeat 72 hours as well.  Thanks.

## 2018-04-19 ENCOUNTER — Telehealth: Payer: Self-pay

## 2018-04-19 NOTE — Telephone Encounter (Signed)
Left message for call back.

## 2018-04-19 NOTE — Telephone Encounter (Signed)
-----   Message from Regina Eck, CNM sent at 04/19/2018  8:01 AM EST ----- Notify patient her urine culture showed E. Coli on appropriate medication. Patient status?

## 2018-04-20 NOTE — Telephone Encounter (Signed)
Patient returned call

## 2018-04-20 NOTE — Telephone Encounter (Signed)
Patient notified of results as written by provider 

## 2018-04-28 ENCOUNTER — Telehealth: Payer: Self-pay | Admitting: Internal Medicine

## 2018-04-28 NOTE — Telephone Encounter (Signed)
Patient's vitamin D level as of 2018 was normal, I can't find any vitamin D deficient lab results----patient states she might of been thinking about something else---I have explained how prolia works with vitamin D and calcium to rebuild bones---pateint will think about it and call back to schedule at a later date

## 2018-04-28 NOTE — Telephone Encounter (Signed)
Insurance has been submitted and verified for Prolia. Patient is responsible for a $65 copay. Due anytime.  Spoke to patient. She is going to think about it and call back to schedule if she would like to begin.  Okay to schedule... Visit Note: Prolia ($0 copay - okay to give per Gareth Eagle) Visit Type: Nurse Provider: Nurse  Jonelle Sidle, patient mentioned that she normally has low Vitamin D. Is this something we should check on before giving?

## 2018-05-16 ENCOUNTER — Encounter: Payer: Self-pay | Admitting: Internal Medicine

## 2018-05-16 MED ORDER — LOSARTAN POTASSIUM 50 MG PO TABS: 50.0000 mg | ORAL_TABLET | Freq: Every day | ORAL | 2 refills | Status: DC

## 2018-05-18 ENCOUNTER — Encounter: Payer: Self-pay | Admitting: Internal Medicine

## 2018-05-20 ENCOUNTER — Telehealth: Payer: Self-pay | Admitting: Pulmonary Disease

## 2018-05-20 NOTE — Telephone Encounter (Signed)
Attempted to call pt but unable to reach. Left message for pt to return call. 

## 2018-05-23 NOTE — Telephone Encounter (Signed)
We have the option for Televisit and determine need for Chest xray or she can discuss with her PCP or will have to wait for follow up in July .

## 2018-05-23 NOTE — Telephone Encounter (Signed)
LMTCB x 1 

## 2018-05-23 NOTE — Telephone Encounter (Signed)
Call made to patient, she states she would like to get a CXR. She denies having any symptoms, denies travel and has not been around any sick. She states Dr. [name] typically always get CXR for her and she would like to keep with the routine. She states she has seen Tammy in the past and would like to know if she would be willing to let her get a CXR and call her with the results.   TP please advise.

## 2018-05-24 NOTE — Telephone Encounter (Signed)
Called patient and made aware of options: 1- televisit- 2- PCP 3- wait until July appt.  Pt has rescheduled 4/2 appt to 7/8. She will see if her pcp will order cxr. Nothing further needed.

## 2018-05-26 ENCOUNTER — Ambulatory Visit: Payer: Medicare Other | Admitting: Pulmonary Disease

## 2018-05-26 ENCOUNTER — Other Ambulatory Visit: Payer: Self-pay | Admitting: Certified Nurse Midwife

## 2018-05-26 DIAGNOSIS — N952 Postmenopausal atrophic vaginitis: Secondary | ICD-10-CM

## 2018-05-26 NOTE — Telephone Encounter (Signed)
Medication refill request: Yuvafem  Last AEX:  06/24/17 DL Next AEX: 06/30/18 Last MMG (if hormonal medication request): 06/09/17 BIRADS 2 benign/density c Refill authorized: Please advise on refills; Order pended #24 w/0 refills if authorized

## 2018-05-28 ENCOUNTER — Encounter: Payer: Self-pay | Admitting: Internal Medicine

## 2018-05-30 ENCOUNTER — Encounter: Payer: Self-pay | Admitting: Internal Medicine

## 2018-06-15 ENCOUNTER — Ambulatory Visit: Payer: Medicare Other | Admitting: Pulmonary Disease

## 2018-06-22 MED ORDER — FLUTTER DEVI: 1.0000 "application " | Freq: Three times a day (TID) | 0 refills | Status: AC

## 2018-06-22 NOTE — Telephone Encounter (Signed)
Yes. Please place order for new flutter valve and let patient know that she can come by to pick it up. Thanks.

## 2018-06-22 NOTE — Telephone Encounter (Signed)
Order for flutter valve is placed Took to [place] to be signed and completed Placed flutter valve in front foyer for pt to p/u Emailed patient available for pick up M-F from 830am to 3pm Nothing further needed

## 2018-06-30 ENCOUNTER — Ambulatory Visit: Payer: Medicare Other | Admitting: Certified Nurse Midwife

## 2018-08-04 ENCOUNTER — Other Ambulatory Visit: Payer: Self-pay | Admitting: Pulmonary Disease

## 2018-08-13 ENCOUNTER — Other Ambulatory Visit: Payer: Self-pay | Admitting: Pulmonary Disease

## 2018-08-22 NOTE — Telephone Encounter (Signed)
Miranda Thompson wrote:  I have an appointment  with Dr. Valeta Harms on July 8 for my regularly scheduled 6 month check-up ( it has been delayed somewhat by circumstances due to the pandemic); nonetheless, Dr. [name]  would enter orders to have my blood work, and occasionally my x-ray the week before so that the results could be discussed at the time of the appointment?    Please ask Dr. Valeta Harms if he will schedule whatever tests he  wants me to have for this week.  I would appreciate it.  Dr Valeta Harms, please advise if you would like anything ordered. Thank you!!

## 2018-08-29 ENCOUNTER — Telehealth: Payer: Self-pay | Admitting: Certified Nurse Midwife

## 2018-08-29 MED ORDER — CIPROFLOXACIN HCL 500 MG PO TABS: 500.0000 mg | ORAL_TABLET | Freq: Two times a day (BID) | ORAL | 0 refills | Status: DC

## 2018-08-29 MED ORDER — TERCONAZOLE 0.4 % VA CREA: 1.0000 | TOPICAL_CREAM | Freq: Every day | VAGINAL | 0 refills | Status: DC

## 2018-08-29 NOTE — Telephone Encounter (Signed)
Patient has had UTI before and Ok to do Cipro 500 mg bid x 3 days and Terazol 7 vaginal cream for use. If not resolving needs OV. She has this often and also dryness.

## 2018-08-29 NOTE — Telephone Encounter (Signed)
Spoke with patient. Advised [name] will send Rx for Cipro 500 mg bid 3 days and Terazol 7 vag.cream for use. She knows to call for appointment if symptoms don't resolve. She thanked [place] for taking good care of her.

## 2018-08-29 NOTE — Telephone Encounter (Signed)
Patient sent the following correspondence through [place]. Routing to triage to assist patient with request.  Hi [name],    I seem to have gotten another UTI, had been going to the bathroom more frequently but, of course, Friday night, I started having problems with horrible burning and itching, I know this is a silly question, but would you feel comfortable prescribing medication for the UTI as well as the magic pill for itching. If you will agree to the prescription idea, perhaps you could prescribe some Monistat as well, due to my double risk of old age and COPD, I have literally been cocooned in the house to reduce risk.    I eat yogurt, drink cranberry juice and have been wearing a pad while riding the stationery bike. Oh dear!    I appreciate your consideration of my idea, I will call in the morning for an appointment if you feel as you always do about not prescribing without an exam. I respect your personal ethics very much.    Hopefully you and yours have weathered the pandemic well and that neither your or your family has been ill.

## 2018-08-29 NOTE — Telephone Encounter (Signed)
I am ok with sending her Rx for Cipro bid x 3 days. She has history of UTI. If she is not seeing any change needs to come in for appointment for urine check. Ok to send Rx for [employer] 7 vaginal cream one applicator daily for 7 days. She needs to be sure and increase fluids. She does not drink enough usually. Please assess symptoms this am also.

## 2018-08-29 NOTE — Telephone Encounter (Signed)
Patient is calling regarding a possible UTI.

## 2018-08-31 ENCOUNTER — Ambulatory Visit: Payer: Medicare Other | Admitting: Pulmonary Disease

## 2018-09-01 ENCOUNTER — Ambulatory Visit: Payer: Medicare Other | Admitting: Pulmonary Disease

## 2018-09-02 ENCOUNTER — Encounter: Payer: Self-pay | Admitting: Internal Medicine

## 2018-09-02 ENCOUNTER — Telehealth: Payer: Self-pay | Admitting: Certified Nurse Midwife

## 2018-09-02 MED ORDER — DILTIAZEM HCL ER COATED BEADS 120 MG PO CP24: 120.0000 mg | ORAL_CAPSULE | Freq: Every day | ORAL | 1 refills | Status: DC

## 2018-09-02 NOTE — Telephone Encounter (Signed)
Patient following up on mychart message for refill on terconazole. Would like to be notified if refill is called in.

## 2018-09-02 NOTE — Telephone Encounter (Signed)
Left message to call Estill Bamberg, CMA. (pt.needs ov)

## 2018-09-02 NOTE — Telephone Encounter (Signed)
Routing to Triage

## 2018-09-02 NOTE — Telephone Encounter (Signed)
Patient sent the following message through [place]. Routing to triage to assist patient with request.   Hi [name],    I just wanted you to know that I had been doing better with the UTI, took my last Cipro tablet yesterday morning (Thursday), and the Terconazole Vaginal Cream really helped. About 3 A M today, I woke up with itching and burning, not nearly as bad as at the beginning of this mess, but, enough to wake me up. I do recall that the last time I took Cipro, it took forever to clear up the yeast infection and had been using Monistat, finally had to contact you to ask if you could prescribe the magic pill to combat the itching and it stilll took days to clear up.    [name] was our 63rd wedding anniversary and we had pizza and I had a beer, do you the that could have caused flare up? My situation is tolerable and I do see you for my annual exam July 21.    Would you consider renewing the Terconazole because I will be running out of it by tonight or tomorrow?  Is it all right to take Benadryl Allergy at night?    Bathes vs showers?      Thank you, have a nice weekend.    [name]

## 2018-09-05 ENCOUNTER — Other Ambulatory Visit: Payer: Self-pay

## 2018-09-05 ENCOUNTER — Telehealth: Payer: Self-pay | Admitting: Certified Nurse Midwife

## 2018-09-05 ENCOUNTER — Ambulatory Visit (INDEPENDENT_AMBULATORY_CARE_PROVIDER_SITE_OTHER): Payer: Medicare Other | Admitting: Certified Nurse Midwife

## 2018-09-05 ENCOUNTER — Encounter: Payer: Self-pay | Admitting: Certified Nurse Midwife

## 2018-09-05 VITALS — BP 110/64 | HR 68 | Temp 97.6°F | Resp 16 | Wt 131.0 lb

## 2018-09-05 DIAGNOSIS — N949 Unspecified condition associated with female genital organs and menstrual cycle: Secondary | ICD-10-CM | POA: Diagnosis not present

## 2018-09-05 DIAGNOSIS — N390 Urinary tract infection, site not specified: Secondary | ICD-10-CM

## 2018-09-05 LAB — POCT URINALYSIS DIPSTICK
Bilirubin, UA: NEGATIVE
Blood, UA: NEGATIVE
Glucose, UA: NEGATIVE
Nitrite, UA: NEGATIVE
Protein, UA: NEGATIVE
Urobilinogen, UA: NEGATIVE E.U./dL — AB
pH, UA: 5 (ref 5.0–8.0)

## 2018-09-05 NOTE — Telephone Encounter (Signed)
Message  I eliminated the breast cancer from the list of maladies because the date said 2009 and it was 1994, it definitely needs to be on my health history. I thought that I would have an opportunity to change it on the next page.    Sorry about causing any confusion.    Miranda Thompson  [birth year]

## 2018-09-05 NOTE — Telephone Encounter (Signed)
Left breast cancer 1994 is still in patient's chart. She can make any changes when seen today for office visit.

## 2018-09-05 NOTE — Telephone Encounter (Signed)
Spoke with patient. She's still complaining of vaginal itching/burning and urinary frequency. Advised patient she needs office visit. Made appointment for today at 4:00pm with Melvia Heaps, CNM. She states has AEX 09-09-18 but doesn't think she can wait until then to be seen for current problem.

## 2018-09-05 NOTE — Progress Notes (Signed)
85 y.o. Married Caucasian female [phone] here with complaint of vaginal symptoms of itching, burning, and scaling external vulva area.. Was treated with Terazol cream and has helped, but still having burning Describes discharge as none, just itching. Completed Cipro on 09/01/2018 for UTI which she feels has resolved. Some urinary frequency, but no pain or urgency. No fever or chills.. Onset of symptoms 5 days ago.Used bath oil with scents and feels this may have caused both issues. Continues with Yuvafem for vaginal dryness. Drinking adequate water daily and cranberry juice..   Review of Systems  Genitourinary: Positive for frequency.       Extended belly  Skin:       Vaginal burning & itching    O:Healthy female WDWN Affect: normal, orientation x 3 poct urine-wbc tr  Exam: Skin: warm and dry Abdomen: soft, not distended, negative suprapubic CVAT negative bilateral  Inguinal Lymph nodes: no enlargement or tenderness Pelvic exam: External genital: normal female with atrophic changes and scaling noted on vulva BUS: negative Bladder, urethral meatus and urethra not red or tender Vagina: scant  discharge noted. No redness or odor. Affirm taken, under clitoral hood redness noted and dryness, show to patient in mirror Cervix: normal, non tender, no CMT Uterus: normal, non tender Adnexa:normal, non tender, no masses or fullness noted    A:Normal pelvic exam UTI treated with Cipro ? Resolved Vaginal dryness using Yuvafem with good results R/O yeast vaginitis   P:Discussed findings of UTI appears to be resolved and only vaginal issues present and etiology. Discussed Aveeno or baking soda sitz bath for comfort.Discussed avoiding bath oils or chemicals increase risk of UTI. other than baking soda or Aveeno bath. Discussed Olive oil externally for dryness. Questions addressed. Lab: Urine culture, Affirm   Rv prn

## 2018-09-06 ENCOUNTER — Ambulatory Visit: Payer: Medicare Other | Admitting: Certified Nurse Midwife

## 2018-09-06 LAB — VAGINITIS/VAGINOSIS, DNA PROBE
Candida Species: NEGATIVE
Gardnerella vaginalis: NEGATIVE
Trichomonas vaginosis: NEGATIVE

## 2018-09-06 LAB — URINE CULTURE

## 2018-09-07 ENCOUNTER — Other Ambulatory Visit: Payer: Self-pay

## 2018-09-07 ENCOUNTER — Telehealth: Payer: Self-pay

## 2018-09-07 NOTE — Progress Notes (Signed)
85 y.o. Miranda Thompson Married  Caucasian Fe here for annual exam. Post menopausal no vaginal bleeding, but vaginal dryness. Using [facility] for vaginal dryness and working well. Sees PCP for aex and hypertension medication, labs, all stable. Feels she has slowed down over the past year, but still driving and working at home. Urinary symptoms from UTI have resolved. Yeast symptoms have resolved from Diflucan use. Still having vaginal dryness, but using Olive oil and Yuvafem. Has mammogram scheduled. No other health issues today.   Patient's last menstrual period was 02/23/1985.          Sexually active: No.  The current method of family planning is vasectomy.    Exercising: Yes.    floor exercises, free weights  Smoker:  Former smoker   Review of Systems  Constitutional: Negative.   HENT: Negative.   Eyes: Negative.   Respiratory: Negative.   Cardiovascular: Negative.   Gastrointestinal: Negative.   Genitourinary:       Vaginal itching and burning   Musculoskeletal: Negative.   Skin: Negative.   Neurological: Negative.   Endo/Heme/Allergies: Negative.   Psychiatric/Behavioral: Negative.     Health Maintenance: Pap:  06-24-17 neg History of Abnormal Pap: no MMG:  06-09-17 category c density birads 1:neg Self Breast exams: occ Colonoscopy:  2016 neg per patient BMD:   2019 TDaP:  2016 Shingles: 2020 Pneumonia: 2016 Hep C and HIV: not done Labs: PCP   reports that she quit smoking about 51 years ago. Her smoking use included cigarettes. She has a 10.00 pack-year smoking history. She has never used smokeless tobacco. She reports current alcohol use of about 1.0 standard drinks of alcohol per week. She reports that she does not use drugs.  Past Medical History:  Diagnosis Date  . Adenomatous colon polyp   . Allergic rhinitis   . Anemia    pt states no anemia in her past hx thst she is aware of   . Atrial fibrillation ([place])    goes in and out of this rhythm- takes Tenormin  . Breast  cancer, left breast ([place]) 1994 with lumpectomy   chemo,tamox,radiation  . Bronchiectasis with acute exacerbation ([place])   . COPD (chronic obstructive pulmonary disease) ([place])   . Diverticulosis of colon   . DJD (degenerative joint disease)   . Dysrhythmia    palpitations  . Family history of colon cancer 02/14/2018  . Family history of malignant neoplasm of gastrointestinal tract   . Fibromyalgia   . GERD (gastroesophageal reflux disease) 02/14/2018  . Glaucoma   . Hemoptysis   . History of pneumonia   . Hypercholesteremia    pt denies  . Hypertension   . Low back pain syndrome   . Neuromuscular disorder (HCC)    hx fibromyalgia   . Osteopenia   . Pneumonia    hx  . Pseudomonas infection   . Rotator cuff syndrome of right shoulder   . Spinal stenosis     Past Surgical History:  Procedure Laterality Date  . COLONOSCOPY    . left breast lumpectomy and LN dissection  06/1992   Dr. [name]  . LUNG BIOPSY  1968   TSurg---Dr. [name]  . LUNG BIOPSY  2012   dr [name]  . POLYPECTOMY    . REVERSE SHOULDER ARTHROPLASTY Right 08/02/2015   Procedure: RIGHT REVERSE TOTAL SHOULDER ARTHROPLASTY;  Surgeon: [name], MD;  Location: [place];  Service: Orthopedics;  Laterality: Right;  . REVERSE TOTAL SHOULDER ARTHROPLASTY Right 08/02/2015  . TONSILLECTOMY    .  TOTAL KNEE ARTHROPLASTY Right 01/21/2015   Procedure: TOTAL RIGHT KNEE ARTHROPLASTY;  Surgeon: [name], MD;  [facility];  Service: Orthopedics;  Laterality: Right;  . TOTAL KNEE ARTHROPLASTY Left 03/23/2016   Procedure: LEFT TOTAL KNEE ARTHROPLASTY;  Surgeon: [name], MD;  [facility];  Service: Orthopedics;  Laterality: Left;    Current Outpatient Medications  Medication Sig Dispense Refill  . ASPIRIN 81 PO Take by mouth.    . Calcium Carb-Cholecalciferol 600-800 MG-UNIT TABS Take 1 tablet by mouth daily.    Marland Kitchen diltiazem (CARDIZEM CD) 120 MG 24 hr capsule Take 1 capsule (120 mg total) by mouth daily. 90  capsule 1  . losartan (COZAAR) 50 MG tablet Take 1 tablet (50 mg total) by mouth daily. 90 tablet 2  . LUMIGAN 0.01 % SOLN     . Multiple Vitamins-Minerals (MULTIVITAMIN & MINERAL PO) Take 1 tablet by mouth daily.      Marland Kitchen Respiratory Therapy Supplies (FLUTTER) DEVI 1 application by Does not apply route 3 (three) times daily. 1 each 0  . WIXELA INHUB 250-50 MCG/DOSE AEPB TAKE 1 PUFF BY MOUTH EVERY DAY 180 each 1  . YUVAFEM 10 MCG TABS vaginal tablet PLACE 1 TABLET (10 MCG TOTAL) VAGINALLY 2 (TWO) TIMES A WEEK. 24 tablet 0   No current facility-administered medications for this visit.     Family History  Problem Relation Age of Onset  . Heart attack Mother        Deceased age 23  . Colon cancer Father        deceased age 65 from colon ca.  . Hypertension Brother   . Colon polyps Daughter   . Esophageal cancer Neg Hx   . Stomach cancer Neg Hx   . Rectal cancer Neg Hx     ROS:  Pertinent items are noted in HPI.  Otherwise, a comprehensive ROS was negative.  Exam:   BP 134/62 (BP Location: Right Arm, Patient Position: Sitting, Cuff Size: Normal)   Pulse 90   Temp (!) 97.3 F (36.3 C) (Temporal)   Resp 16   Ht 5\' 3"  (1.6 m)   Wt 132 lb 4 oz (60 kg)   LMP 02/23/1985   BMI 23.43 kg/m  Height: 5\' 3"  (160 cm) Ht Readings from Last 3 Encounters:  09/09/18 5\' 3"  (1.6 m)  02/14/18 5\' 3"  (1.6 m)  01/13/18 5\' 3"  (1.6 m)    General appearance: alert, cooperative and appears stated age Head: Normocephalic, without obvious abnormality, atraumatic Neck: no adenopathy, supple, symmetrical, trachea midline and thyroid normal to inspection and palpation Lungs: clear to auscultation bilaterally Breasts: normal appearance, no masses or tenderness, No nipple retraction or dimpling, No nipple discharge or bleeding, No axillary or supraclavicular adenopathy Heart: regular rate and rhythm Abdomen: soft, non-tender; no masses,  no organomegaly Extremities: extremities normal, atraumatic, no  cyanosis or edema Skin: Skin color, texture, turgor normal. No rashes or lesions Lymph nodes: Cervical, supraclavicular, and axillary nodes normal. No abnormal inguinal nodes palpated Neurologic: Grossly normal   Pelvic: External genitalia:  no lesions              Urethra:  normal appearing urethra with no masses, tenderness or lesions              Bartholin's and Skene's: normal                 Vagina: atrophic appearing vagina with normal color and scant discharge, no lesions  Cervix: no cervical motion tenderness, no lesions and normal appearance              Pap taken: No. Bimanual Exam:  Uterus:  normal size, contour, position, consistency, mobility, non-tender              Adnexa: normal adnexa and no mass, fullness, tenderness               Rectovaginal: Confirms               Anus:  normal sphincter tone, no lesions  Chaperone present: yes  A:  Well Woman with normal exam  Post menopausal no HRT  Atrophic vaginitis using Yuvafem with good results and Olive oil for dryness  Yeast vaginitis resolved  PCP management of labs, lung issues.  P:   Reviewed health and wellness pertinent to exam  Aware of need to advise if vaginal bleeding  Risks/benefits of Yuvafem given, desires continuance.  Rx Yuvafem see order with instructions  Continue follow up with PCP as indicated.  Pap smear: no   counseled on breast self exam, mammography screening, adequate intake of calcium and vitamin D, diet and exercise, Kegel's exercises  return annually or prn  An After Visit Summary was printed and given to the patient.

## 2018-09-07 NOTE — Telephone Encounter (Signed)
Patient notified of results. See lab 

## 2018-09-07 NOTE — Telephone Encounter (Signed)
Left message for call back.

## 2018-09-07 NOTE — Telephone Encounter (Signed)
-----   Message from Regina Eck, CNM sent at 09/07/2018  7:17 AM EDT ----- Notify patient her urine culture showed mixed urogenital flora, low count. So would not retreat at this point. Work with good water intake, treat Vaginal dryness as we discussed and take cranberry capsule or juice daily. If symptoms change please advise

## 2018-09-08 ENCOUNTER — Telehealth: Payer: Self-pay | Admitting: Certified Nurse Midwife

## 2018-09-08 NOTE — Telephone Encounter (Signed)
Call to patient. States vulvar itching is main issue. Has some burning. Denies pelvic or back pain. Denies fever. States urinary burning is when urine touches skin, not at urethra.  Advised to avoid any additional product use to external vulva until [name] is able to evaluate.  Call back if develops any additional or change in symptoms.  Routing to provider. Encounter closed.

## 2018-09-08 NOTE — Telephone Encounter (Signed)
Hi, it is me again, I will be in to see Miranda Thompson tomorrow for my annual exam, just want her to know that the frequent urination is still going on and that the vulva itching is still bad, burning and itching to beat the bad despite olive oil.    I dont have bacteria or yeast showing in the specimen, have no idea what is going on, but it is very uncomfortable.    Thanks, just wanted Miranda Thompson to be prepared tomorrow.    Have a nice afternoon, stay safe.      Miranda Thompson

## 2018-09-09 ENCOUNTER — Ambulatory Visit (INDEPENDENT_AMBULATORY_CARE_PROVIDER_SITE_OTHER): Payer: Medicare Other | Admitting: Certified Nurse Midwife

## 2018-09-09 ENCOUNTER — Other Ambulatory Visit: Payer: Self-pay

## 2018-09-09 ENCOUNTER — Encounter: Payer: Self-pay | Admitting: Certified Nurse Midwife

## 2018-09-09 VITALS — BP 134/62 | HR 90 | Temp 97.3°F | Resp 16 | Ht 63.0 in | Wt 132.2 lb

## 2018-09-09 DIAGNOSIS — N952 Postmenopausal atrophic vaginitis: Secondary | ICD-10-CM

## 2018-09-09 DIAGNOSIS — Z853 Personal history of malignant neoplasm of breast: Secondary | ICD-10-CM | POA: Diagnosis not present

## 2018-09-09 DIAGNOSIS — Z87440 Personal history of urinary (tract) infections: Secondary | ICD-10-CM

## 2018-09-09 DIAGNOSIS — Z01419 Encounter for gynecological examination (general) (routine) without abnormal findings: Secondary | ICD-10-CM | POA: Diagnosis not present

## 2018-09-09 MED ORDER — ESTRADIOL 10 MCG VA TABS: ORAL_TABLET | VAGINAL | 3 refills | Status: DC

## 2018-09-13 ENCOUNTER — Other Ambulatory Visit: Payer: Self-pay

## 2018-09-13 ENCOUNTER — Encounter: Payer: Self-pay | Admitting: Pulmonary Disease

## 2018-09-13 ENCOUNTER — Ambulatory Visit (INDEPENDENT_AMBULATORY_CARE_PROVIDER_SITE_OTHER): Payer: Medicare Other | Admitting: Pulmonary Disease

## 2018-09-13 VITALS — BP 110/68 | HR 72 | Ht 63.0 in | Wt 132.6 lb

## 2018-09-13 DIAGNOSIS — R093 Abnormal sputum: Secondary | ICD-10-CM | POA: Diagnosis not present

## 2018-09-13 DIAGNOSIS — J471 Bronchiectasis with (acute) exacerbation: Secondary | ICD-10-CM | POA: Diagnosis not present

## 2018-09-13 DIAGNOSIS — R05 Cough: Secondary | ICD-10-CM | POA: Diagnosis not present

## 2018-09-13 DIAGNOSIS — R059 Cough, unspecified: Secondary | ICD-10-CM

## 2018-09-13 MED ORDER — FLUTTER DEVI: 0 refills | Status: DC

## 2018-09-13 NOTE — Progress Notes (Signed)
Synopsis: Referred in July 2020 for est care from Dr. [name], PCP: [name], MD  Subjective:   PATIENT ID: Miranda Thompson GENDER: female DOB: 1933-08-18, MRN: [id number]  Chief Complaint  Patient presents with  . New Patient (Initial Visit)    Former Programmer, systems Patient. She reports she has been coughing up green mucous.     Former patient of Dr. [name].  Followed for COPD and bronchiectasis.  Here today to establish care.  She is currently managed with a flutter valve and individual postural drainage at home.  She exercises regularly.  She rides a stationary Peloton bicycle 6 times per week.  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  She believes that her first lung injury occurred when she was washing her husband's clothes many years ago in the basement when she decided to mix Clorox bleach and ammonium.  She was a horrible gas fumes that came out of the basement washing machine that drove her out of the home.  She states her flutter valve works some of the times but does not work as well as postural drainage.  As she is getting older she has become weaker and unable to do these at home.  She currently lives alone is unable to do CPT or have someone to help her with CPT.  She has not had axial CT imaging of the chest since 2004.  She did have breast cancer in the early 90s status post radiation to the left side following a lumpectomy.  She has daily cough and sputum production.  No recent fevers.   Past Medical History:  Diagnosis Date  . Adenomatous colon polyp   . Allergic rhinitis   . Anemia    pt states no anemia in her past hx thst she is aware of   . Atrial fibrillation ([place])    goes in and out of this rhythm- takes Tenormin  . Breast cancer, left breast ([place]) 1994 with lumpectomy   chemo,tamox,radiation  . Bronchiectasis with acute exacerbation ([place])   . COPD (chronic obstructive pulmonary disease) ([place])   . Diverticulosis of  colon   . DJD (degenerative joint disease)   . Dysrhythmia    palpitations  . Family history of colon cancer 02/14/2018  . Family history of malignant neoplasm of gastrointestinal tract   . Fibromyalgia   . GERD (gastroesophageal reflux disease) 02/14/2018  . Glaucoma   . Hemoptysis   . History of pneumonia   . Hypercholesteremia    pt denies  . Hypertension   . Low back pain syndrome   . Neuromuscular disorder (HCC)    hx fibromyalgia   . Osteopenia   . Pneumonia    hx  . Pseudomonas infection   . Rotator cuff syndrome of right shoulder   . Spinal stenosis      Family History  Problem Relation Age of Onset  . Heart attack Mother        Deceased age 110  . Colon cancer Father        deceased age 10 from colon ca.  . Hypertension Brother   . Colon polyps Daughter   . Esophageal cancer Neg Hx   . Stomach cancer Neg Hx   . Rectal cancer Neg Hx      Past Surgical History:  Procedure Laterality Date  . COLONOSCOPY    . left breast lumpectomy and LN dissection  06/1992   Dr. [name]  .  LUNG BIOPSY  1968   TSurg---Dr. [name]  . LUNG BIOPSY  2012   dr [name]  . POLYPECTOMY    . REVERSE SHOULDER ARTHROPLASTY Right 08/02/2015   Procedure: RIGHT REVERSE TOTAL SHOULDER ARTHROPLASTY;  Surgeon: [name], MD;  Location: [facility];  Service: Orthopedics;  Laterality: Right;  . REVERSE TOTAL SHOULDER ARTHROPLASTY Right 08/02/2015  . TONSILLECTOMY    . TOTAL KNEE ARTHROPLASTY Right 01/21/2015   Procedure: TOTAL RIGHT KNEE ARTHROPLASTY;  Surgeon: [name], MD;  [facility];  Service: Orthopedics;  Laterality: Right;  . TOTAL KNEE ARTHROPLASTY Left 03/23/2016   Procedure: LEFT TOTAL KNEE ARTHROPLASTY;  Surgeon: [name], MD;  [facility];  Service: Orthopedics;  Laterality: Left;    Social History   Socioeconomic History  . Marital status: Married    Spouse name: Not on file  . Number of children: 5  . Years of education: Not on file  . Highest education  level: Not on file  Occupational History  . Occupation: Retired  Scientific laboratory technician  . Financial resource strain: Not on file  . Food insecurity    Worry: Not on file    Inability: Not on file  . Transportation needs    Medical: Not on file    Non-medical: Not on file  Tobacco Use  . Smoking status: Former Smoker    Packs/day: 0.25    Years: 40.00    Pack years: 10.00    Types: Cigarettes    Quit date: 02/24/1967    Years since quitting: 51.5  . Smokeless tobacco: Never Used  Substance and Sexual Activity  . Alcohol use: Yes    Alcohol/week: 1.0 standard drinks    Types: 1 Standard drinks or equivalent per week    Comment: occasionally  . Drug use: No  . Sexual activity: Not Currently    Partners: Male    Comment: husband vasectomy  Lifestyle  . Physical activity    Days per week: Not on file    Minutes per session: Not on file  . Stress: Not on file  Relationships  . Social Herbalist on phone: Not on file    Gets together: Not on file    Attends religious service: Not on file    Active member of club or organization: Not on file    Attends meetings of clubs or organizations: Not on file    Relationship status: Not on file  . Intimate partner violence    Fear of current or ex partner: Not on file    Emotionally abused: Not on file    Physically abused: Not on file    Forced sexual activity: Not on file  Other Topics Concern  . Not on file  Social History Narrative  . Not on file     No Known Allergies   Outpatient Medications Prior to Visit  Medication Sig Dispense Refill  . ASPIRIN 81 PO Take by mouth.    . Calcium Carb-Cholecalciferol 600-800 MG-UNIT TABS Take 1 tablet by mouth daily.    Marland Kitchen diltiazem (CARDIZEM CD) 120 MG 24 hr capsule Take 1 capsule (120 mg total) by mouth daily. 90 capsule 1  . Estradiol (YUVAFEM) 10 MCG TABS vaginal tablet Place one tablet  in vagina twice weekly 24 tablet 3  . losartan (COZAAR) 50 MG tablet Take 1 tablet (50 mg  total) by mouth daily. 90 tablet 2  . LUMIGAN 0.01 % SOLN     . Multiple Vitamins-Minerals (  MULTIVITAMIN & MINERAL PO) Take 1 tablet by mouth daily.      Marland Kitchen Respiratory Therapy Supplies (FLUTTER) DEVI 1 application by Does not apply route 3 (three) times daily. 1 each 0  . WIXELA INHUB 250-50 MCG/DOSE AEPB TAKE 1 PUFF BY MOUTH EVERY DAY 180 each 1   No facility-administered medications prior to visit.     Review of Systems  Constitutional: Negative for chills, fever, malaise/fatigue and weight loss.  HENT: Negative for hearing loss, sore throat and tinnitus.   Eyes: Negative for blurred vision and double vision.  Respiratory: Positive for cough, sputum production and shortness of breath. Negative for hemoptysis, wheezing and stridor.   Cardiovascular: Negative for chest pain, palpitations, orthopnea, leg swelling and PND.  Gastrointestinal: Negative for abdominal pain, constipation, diarrhea, heartburn, nausea and vomiting.  Genitourinary: Negative for dysuria, hematuria and urgency.  Musculoskeletal: Negative for joint pain and myalgias.  Skin: Negative for itching and rash.  Neurological: Negative for dizziness, tingling, weakness and headaches.  Endo/Heme/Allergies: Negative for environmental allergies. Does not bruise/bleed easily.  Psychiatric/Behavioral: Negative for depression. The patient is not nervous/anxious and does not have insomnia.   All other systems reviewed and are negative.    Objective:  Physical Exam Vitals signs reviewed.  Constitutional:      General: She is not in acute distress.    Appearance: She is well-developed.  HENT:     Head: Normocephalic and atraumatic.  Eyes:     General: No scleral icterus.    Conjunctiva/sclera: Conjunctivae normal.     Pupils: Pupils are equal, round, and reactive to light.  Neck:     Musculoskeletal: Neck supple.     Vascular: No JVD.     Trachea: No tracheal deviation.  Cardiovascular:     Rate and Rhythm: Normal rate  and regular rhythm.     Heart sounds: Normal heart sounds. No murmur.  Pulmonary:     Effort: Pulmonary effort is normal. No tachypnea, accessory muscle usage or respiratory distress.     Breath sounds: Normal breath sounds. No stridor. No wheezing, rhonchi or rales.     Comments: Bilateral tubular breath sounds Abdominal:     General: Bowel sounds are normal. There is no distension.     Palpations: Abdomen is soft.     Tenderness: There is no abdominal tenderness.  Musculoskeletal:        General: No tenderness.  Lymphadenopathy:     Cervical: No cervical adenopathy.  Skin:    General: Skin is warm and dry.     Capillary Refill: Capillary refill takes less than 2 seconds.     Findings: No rash.  Neurological:     Mental Status: She is alert and oriented to person, place, and time.  Psychiatric:        Behavior: Behavior normal.      Vitals:   09/13/18 1435  BP: 110/68  Pulse: 72  SpO2: 97%  Weight: 132 lb 9.6 oz (60.1 kg)  Height: 5\' 3"  (1.6 m)   97% on RA BMI Readings from Last 3 Encounters:  09/13/18 23.49 kg/m  09/09/18 23.43 kg/m  09/05/18 23.21 kg/m   Wt Readings from Last 3 Encounters:  09/13/18 132 lb 9.6 oz (60.1 kg)  09/09/18 132 lb 4 oz (60 kg)  09/05/18 131 lb (59.4 kg)     CBC    Component Value Date/Time   WBC 5.6 01/12/2018 0939   RBC 3.88 01/12/2018 0939   HGB 12.9 01/12/2018 0939  HCT 37.7 01/12/2018 0939   PLT 245.0 01/12/2018 0939   MCV 97.1 01/12/2018 0939   MCH 32.3 10/11/2016 1131   MCHC 34.1 01/12/2018 0939   RDW 12.8 01/12/2018 0939   LYMPHSABS 1.2 01/12/2018 0939   MONOABS 0.6 01/12/2018 0939   EOSABS 0.2 01/12/2018 0939   BASOSABS 0.1 01/12/2018 0939    Chest Imaging:  CT Chest in 2004: IMPRESSION 1.  MULTIPLE AREAS OF BRONCHIECTATIC CHANGE WITH ASSOCIATED DISTAL ALVEOLAR INFILTRATES SUGGESTING POSTOBSTRUCTIVE PNEUMONITIS AND/OR ATELECTASIS. SEVERAL OF THE DILATED BRONCHI SHOW INTRALUMINAL MATERIAL AND SUGGEST THE  PRESENCE OF MUCOUS PLUGS. 2.  AREA OF FOCAL PLEURAL-BASED DENSITY IN THE RIGHT LOWER WHICH DOES NOT APPEAR TO BE RELATED TO ASSOCIATED BRONCHIECTASIS AND IS MORE WORRISOME FOR AN AREA OF FOCAL NEOPLASTIC CHANGE OR POSSIBLY A FOCUS OF ROUNDED PNEUMONIA.   AN AREA OF FOCAL INFARCTION WOULD BE A THIRD CONSIDERATION IN THE APPROPRIATE CLINICAL SETTING.  SHORT-TERM FOLLOW-UP WITH CT CAN BE UNDERTAKEN TO ASSESS FOR SOME DEGREE OF INTERVAL RESOLUTION IN TWO TO THREE WEEKS' TIME AS MIGHT BE EXPECTED WITH EITHER A PNEUMONITIS OR A RESOLVING AREA OF INFARCTION.  IF NO INTERVAL RESOLUTION IS APPRECIATED OVER THIS PERIOD OF TIME, THEN A NEOPLASTIC PROCESS WOULD BE A MORE WORRISOME CONSIDERATION AND TISSUE SAMPLING WOULD BE RECOMMENDED AT THAT POINT.  2018 chest x-ray: Chronic interstitial changes within the bilateral bases. The patient's images have been independently reviewed by me.    Pulmonary Functions Testing Results: No flowsheet data found.   FeNO: None   Pathology: None   Echocardiogram: None   Heart Catheterization: None     Assessment & Plan:     ICD-10-CM   1. Bronchiectasis with acute exacerbation ([place])  J47.1 CT CHEST WO CONTRAST  2. Cough  R05   3. Increased sputum production  R09.3     Discussion:  This is an 85 year old female with a longstanding history of bronchiectasis.  She uses postural drainage and a flutter valve right now to keep airway clearance maintainable.  Her flutter valve is currently broken and she needs a replacement. This works for her most of the time but she still has increasing sputum production.  She tries to exercise regularly doing postural drainage at home as tolerated.  She is unable to have assistance with any type of CPT.  She is unable to complete CPT by herself.  She has not had axial CT imaging since 2004.  We will complete a noncontrasted CT of the chest for evaluation of her bronchiectasis and any progression or change or evidence of fibrotic  disease.  She may very well benefit from vest therapy in the future.  We discussed this today in the office to include risk benefits and alternatives of proceeding with vest therapy.  She can continue her current inhaler regimen at this time.  We will prescribe any refills as needed.  Greater than 50% of this patient's 25-minute of visit was spent face-to-face discussing the above recommendations and treatment plan.    Current Outpatient Medications:  .  ASPIRIN 81 PO, Take by mouth., Disp: , Rfl:  .  Calcium Carb-Cholecalciferol 600-800 MG-UNIT TABS, Take 1 tablet by mouth daily., Disp: , Rfl:  .  diltiazem (CARDIZEM CD) 120 MG 24 hr capsule, Take 1 capsule (120 mg total) by mouth daily., Disp: 90 capsule, Rfl: 1 .  Estradiol (YUVAFEM) 10 MCG TABS vaginal tablet, Place one tablet  in vagina twice weekly, Disp: 24 tablet, Rfl: 3 .  losartan (COZAAR) 50 MG  tablet, Take 1 tablet (50 mg total) by mouth daily., Disp: 90 tablet, Rfl: 2 .  LUMIGAN 0.01 % SOLN, , Disp: , Rfl:  .  Multiple Vitamins-Minerals (MULTIVITAMIN & MINERAL PO), Take 1 tablet by mouth daily.  , Disp: , Rfl:  .  Respiratory Therapy Supplies (FLUTTER) DEVI, 1 application by Does not apply route 3 (three) times daily., Disp: 1 each, Rfl: 0 .  WIXELA INHUB 250-50 MCG/DOSE AEPB, TAKE 1 PUFF BY MOUTH EVERY DAY, Disp: 180 each, Rfl: 1   Garner Nash, DO [facility] 09/13/2018 2:42 PM

## 2018-09-13 NOTE — Patient Instructions (Addendum)
Thank you for visiting Dr. Valeta Harms at [facility]. Today we recommend the following:  Orders Placed This Encounter  Procedures  . CT CHEST WO CONTRAST   We will order you a new flutter valve.   Return in about 6 months (around 03/16/2019).    Please do your part to reduce the spread of COVID-19.

## 2018-09-14 ENCOUNTER — Ambulatory Visit (INDEPENDENT_AMBULATORY_CARE_PROVIDER_SITE_OTHER)
Admission: RE | Admit: 2018-09-14 | Discharge: 2018-09-14 | Disposition: A | Discharge status: Home / Self Care | Payer: Medicare Other | Source: Ambulatory Visit | Attending: Pulmonary Disease | Admitting: Pulmonary Disease

## 2018-09-14 DIAGNOSIS — J471 Bronchiectasis with (acute) exacerbation: Secondary | ICD-10-CM

## 2018-09-16 ENCOUNTER — Telehealth: Payer: Self-pay | Admitting: Pulmonary Disease

## 2018-09-16 DIAGNOSIS — R911 Solitary pulmonary nodule: Secondary | ICD-10-CM

## 2018-09-16 NOTE — Progress Notes (Signed)
[place],   She needs a 3 month follow up CT chest for follow up regarding the nodule in the LUL.   If she calls back you can let her know the CT results redemonstrate her chronic bronchiectasis but the nodule in the LUL needs to be followed up.   [facility] 09/16/2018 5:35 PM

## 2018-09-16 NOTE — Telephone Encounter (Signed)
PCCM: I attempted to call the patient regarding her CT Chest  Stable bronchiectasis present  New LUL cystic nodule that needs follow up non contrasted CT in 3 months.  Orders placed.  I left her a VM to call us back  [place] to let her know the above info Happy to speak with her if she has questions.   Garner Nash, DO [facility] 09/16/2018 5:39 PM

## 2018-09-19 NOTE — Telephone Encounter (Signed)
Noted. Please see result note. Nothing further needed at this time.

## 2018-09-20 ENCOUNTER — Telehealth: Payer: Self-pay | Admitting: *Deleted

## 2018-09-20 LAB — HM MAMMOGRAPHY

## 2018-09-20 NOTE — Telephone Encounter (Signed)
Miranda Thompson calling from [place]. Patient is there for her screening MMG. Patient denies any concerns or complaints with breast. Patient states something was felt on exam her breast during her AEX on 09/09/18 and was advised to go to [facility] for imaging. Advised Miranda Thompson per review of AEX notes no abnormal findings. Will review with Miranda Thompson, CNM and return call.   Reviewed with Miranda Thompson, CNM, confirmed no abnormal finding, patient may proceed with screening MMG as scheduled.    Call returned to [place] at [place], advised as seen above. Patient will proceed with screening MMG.   Routing to [place], CNM FYI.   Encounter closed.

## 2018-09-21 ENCOUNTER — Telehealth: Payer: Self-pay | Admitting: Pulmonary Disease

## 2018-09-21 NOTE — Telephone Encounter (Signed)
Dr Valeta Harms, this message was sent to you today.    I 'm sorry that I missed Dr. Juline Patch call, [place] called and said all is well: however, I read my CT report of 09/14/18, and have some concerns: I interpret the findings to suggest that there is reason to suspect PAH, isn't that serious and should that be explored further?  Would the fact that there is "moderate prominence of the right main artery" be the cause of my shortness of breath? Does that increase heart attack or pulmonary blood clot risk? What would the next step be to determine the extent of the problem?     Do I need to make changes in lifestyle or treatment to lessen the risk of that happening?    I must say, just reading the term of possible PAH causes great concern.    Are nodules possibly serious, or just a result of bronchiectasis?    Thank you,    Donyea Gafford  1933/08/08                             Message routed to Dr Valeta Harms

## 2018-09-21 NOTE — Telephone Encounter (Signed)
PCCM:  I attempted to call the patient to discuss her CT results.  There was no answer I left a voicemail at the number provided.  If the patient calls back please obtain several times in which the patient will be available to discuss her results.  Thanks,  Garner Nash, DO [facility] 09/21/2018 6:59 PM

## 2018-09-22 ENCOUNTER — Telehealth: Payer: Self-pay | Admitting: Pulmonary Disease

## 2018-09-22 DIAGNOSIS — I272 Pulmonary hypertension, unspecified: Secondary | ICD-10-CM

## 2018-09-22 NOTE — Telephone Encounter (Signed)
PCCM:  I called and discussed the patients CT results.  Repeat CT chest in 3 Months for LLL nodule  Enlarged pulmonary artery we will order an echo  She has follow up with [name] in September.   Thanks  Garner Nash, DO [facility] 09/22/2018 5:40 PM

## 2018-09-22 NOTE — Telephone Encounter (Signed)
Pt sent this email today:  [place], please convey to Dr. Valeta Harms my sincere apologies for not being available when he called, I had expected a message via My Chart.  I will be available for the foreseeable future.  Obviously, he is very busy,  I'm confident that if I had an issue requiring intervention,  I would have been contacted.  Thanks so much,  Miranda Thompson

## 2018-09-22 NOTE — Telephone Encounter (Signed)
Miranda Thompson can you order echo on her in next couple of weeks r/o pulmonary HTN thanks

## 2018-09-22 NOTE — Telephone Encounter (Signed)
PCCM: I spoke with patient.  Please see separate telephone note Thanks Garner Nash, DO [facility] 09/22/2018 5:43 PM

## 2018-09-23 NOTE — Telephone Encounter (Signed)
Order placed for echocardiogram Message sent to [facility] for patient to be scheduled and contacted

## 2018-09-23 NOTE — Addendum Note (Signed)
Addended by: Emmaline Life on: 09/23/2018 08:25 AM   Modules accepted: Orders

## 2018-09-27 ENCOUNTER — Ambulatory Visit (HOSPITAL_COMMUNITY): Payer: Medicare Other | Attending: Cardiovascular Disease

## 2018-09-27 ENCOUNTER — Other Ambulatory Visit: Payer: Self-pay

## 2018-09-27 DIAGNOSIS — I272 Pulmonary hypertension, unspecified: Secondary | ICD-10-CM | POA: Insufficient documentation

## 2018-09-29 ENCOUNTER — Telehealth: Payer: Self-pay | Admitting: Cardiovascular Disease

## 2018-09-29 NOTE — Telephone Encounter (Signed)
New Message ° ° ° °Pt is returning call  ° ° ° °Please call back  °

## 2018-09-29 NOTE — Telephone Encounter (Signed)
Patient called awaiting Echo results.  I told her that we would reach out to her when resulted.  She verbalized understanding and thanked me for the call.

## 2018-10-03 NOTE — Telephone Encounter (Signed)
Hi there, I was happy to see the great results of my echocardiogram; however, I am going to see my periodontist later in the week and, I assume I do not have pulmonary hypertension based the echo, but, wanted to make sure because they always ask about new health conditions.    Thanks so much, I hope my question is not too nonsensical.    Miranda Thompson  September 04, 1933     Message routed to Dr Valeta Harms

## 2018-10-19 ENCOUNTER — Encounter: Payer: Self-pay | Admitting: Internal Medicine

## 2018-10-19 DIAGNOSIS — Z800 Family history of malignant neoplasm of digestive organs: Secondary | ICD-10-CM

## 2018-11-01 ENCOUNTER — Telehealth: Payer: Self-pay | Admitting: Genetic Counselor

## 2018-11-01 NOTE — Telephone Encounter (Signed)
Received a genetic counseling referral from Dr. Jenny Reichmann for fhx of lynch syndrome. Miranda Thompson has been cld and scheduled to see Miranda Thompson on 9/9 at 2pm. She's aware to arrive 15 minutes early.

## 2018-11-02 ENCOUNTER — Other Ambulatory Visit: Payer: Self-pay | Admitting: Genetic Counselor

## 2018-11-02 ENCOUNTER — Encounter: Payer: Self-pay | Admitting: Genetic Counselor

## 2018-11-02 ENCOUNTER — Inpatient Hospital Stay: Payer: Medicare Other

## 2018-11-02 ENCOUNTER — Inpatient Hospital Stay: Payer: Medicare Other | Attending: Genetic Counselor | Admitting: Genetic Counselor

## 2018-11-02 ENCOUNTER — Other Ambulatory Visit: Payer: Self-pay

## 2018-11-02 DIAGNOSIS — Z808 Family history of malignant neoplasm of other organs or systems: Secondary | ICD-10-CM | POA: Insufficient documentation

## 2018-11-02 DIAGNOSIS — Z800 Family history of malignant neoplasm of digestive organs: Secondary | ICD-10-CM

## 2018-11-02 DIAGNOSIS — Z853 Personal history of malignant neoplasm of breast: Secondary | ICD-10-CM

## 2018-11-02 DIAGNOSIS — Z8049 Family history of malignant neoplasm of other genital organs: Secondary | ICD-10-CM | POA: Insufficient documentation

## 2018-11-02 NOTE — Progress Notes (Signed)
REFERRING PROVIDER: [name], MD [address],  [place] [address]  PRIMARY PROVIDER:  [name], MD  PRIMARY REASON FOR VISIT:  1. History of left breast cancer   2. Family history of uterine cancer   3. Family history of brain cancer   4. Family history of melanoma      HISTORY OF PRESENT ILLNESS:   Ms. Miranda Thompson, a 85 y.o. female, was seen for a [facility] cancer genetics consultation at the request of Dr. Jenny Reichmann due to a personal and family history of cancer, and a known familial mutation in MSH6 found in her daughter.  Ms. Guest presents to clinic today to discuss the possibility of a hereditary predisposition to cancer, genetic testing, and to further clarify her future cancer risks, as well as potential cancer risks for family members.   In 1996, at the age of 49, Ms. Laird was diagnosed with breast cancer. The treatment plan included a lumpectomy.  Her daughter was diagnosed with uterine cancer at age 85 and was found to have a loss of MSH6 on IHC on her uterine tumor.  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   CANCER HISTORY:  Oncology History   No history exists.     RISK FACTORS:  Menarche was at age 50.  First live birth at age 12.  OCP use for approximately 0 years.  Ovaries intact: yes.  Hysterectomy: no.  Menopausal status: postmenopausal.  HRT use: 6 years. Colonoscopy: yes; normal. Mammogram within the last year: yes. Number of breast biopsies: 0 and 1. Up to date with pelvic exams: yes. Any excessive radiation exposure in the past: no  Past Medical History:  Diagnosis Date  . Adenomatous colon polyp   . Allergic rhinitis   . Anemia    pt states no anemia in her past hx thst she is aware of   . Atrial fibrillation ([facility])    goes in and out of this rhythm- takes Tenormin  . Breast cancer, left breast ([facility]) 1994 with lumpectomy    chemo,tamox,radiation  . Bronchiectasis with acute exacerbation ([facility])   . COPD (chronic obstructive pulmonary disease) ([facility])   . Diverticulosis of colon   . DJD (degenerative joint disease)   . Dysrhythmia    palpitations  . Family history of brain cancer   . Family history of colon cancer 02/14/2018  . Family history of colon cancer   . Family history of malignant neoplasm of gastrointestinal tract   . Family history of melanoma   . Family history of uterine cancer   . Fibromyalgia   . GERD (gastroesophageal reflux disease) 02/14/2018  . Glaucoma   . Hemoptysis   . History of pneumonia   . Hypercholesteremia    pt denies  . Hypertension   . Low back pain syndrome   . Neuromuscular disorder ([facility])    hx fibromyalgia   . Osteopenia   . Pneumonia    hx  . Pseudomonas infection   . Rotator cuff syndrome of right shoulder   . Spinal stenosis     Past Surgical History:  Procedure Laterality Date  . COLONOSCOPY    . left breast lumpectomy and LN dissection  06/1992   Dr. [name]  . LUNG BIOPSY  1968   TSurg---Dr. [name]  . LUNG BIOPSY  2012   dr [name]  . POLYPECTOMY    . REVERSE  SHOULDER ARTHROPLASTY Right 08/02/2015   Procedure: RIGHT REVERSE TOTAL SHOULDER ARTHROPLASTY;  Surgeon: [name], MD;  Location: [facility];  Service: Orthopedics;  Laterality: Right;  . REVERSE TOTAL SHOULDER ARTHROPLASTY Right 08/02/2015  . TONSILLECTOMY    . TOTAL KNEE ARTHROPLASTY Right 01/21/2015   Procedure: TOTAL RIGHT KNEE ARTHROPLASTY;  Surgeon: [name], MD;  Location: [facility];  Service: Orthopedics;  Laterality: Right;  . TOTAL KNEE ARTHROPLASTY Left 03/23/2016   Procedure: LEFT TOTAL KNEE ARTHROPLASTY;  Surgeon: [name], MD;  Location: [facility];  Service: Orthopedics;  Laterality: Left;    Social History   Socioeconomic History  . Marital status: Married    Spouse name: Not on file  . Number of children: 5  . Years of education: Not on file  . Highest education level: Not on  file  Occupational History  . Occupation: Retired  Scientific laboratory technician  . Financial resource strain: Not on file  . Food insecurity    Worry: Not on file    Inability: Not on file  . Transportation needs    Medical: Not on file    Non-medical: Not on file  Tobacco Use  . Smoking status: Former Smoker    Packs/day: 0.25    Years: 40.00    Pack years: 10.00    Types: Cigarettes    Quit date: 02/24/1967    Years since quitting: 51.7  . Smokeless tobacco: Never Used  Substance and Sexual Activity  . Alcohol use: Yes    Alcohol/week: 1.0 standard drinks    Types: 1 Standard drinks or equivalent per week    Comment: occasionally  . Drug use: No  . Sexual activity: Not Currently    Partners: Male    Comment: husband vasectomy  Lifestyle  . Physical activity    Days per week: Not on file    Minutes per session: Not on file  . Stress: Not on file  Relationships  . Social Herbalist on phone: Not on file    Gets together: Not on file    Attends religious service: Not on file    Active member of club or organization: Not on file    Attends meetings of clubs or organizations: Not on file    Relationship status: Not on file  Other Topics Concern  . Not on file  Social History Narrative  . Not on file     FAMILY HISTORY:  We obtained a detailed, 4-generation family history.  Significant diagnoses are listed below: Family History  Problem Relation Age of Onset  . Heart attack Mother        Deceased age 7  . Colon cancer Father 58       deceased age 61 from colon ca.  . Hypertension Brother   . Colon polyps Daughter   . Uterine cancer Daughter 14  . Brain cancer Maternal Uncle   . Stroke Maternal Grandmother   . Other Paternal Uncle 12       possible cancer  . Melanoma Daughter 12  . Melanoma Niece 25       d. 24  . Esophageal cancer Neg Hx   . Stomach cancer Neg Hx   . Rectal cancer Neg Hx     The patient has five children.  One daughter had melanoma in situ  at age 14, and another daughter had uterine cancer at 50.  This daughter was diagnosed with Lynch syndrome.  The patient' has one brother who is  cancer free, but his daughter had melanoma in her 15's and died at 57.  The parents are deceased.  The patient's father was diagnosed with colon cancer at 39 and died at 69.  He had one full brother who died 'in pain' at age 73.  He had a paternal half brother and sister who are cancer free.  The paternal grandparents are deceased.  The grandmother died in childbirth, and the grandfather died of unknown reasons.  The patient's mother died of a heart attack at 79.  She had a brother and sister.  The brother had brain cancer.  The maternal grandparents are deceased from non cancer related issues.  Ms. Sarno is aware of previous family history of genetic testing for hereditary cancer risks. Patient's maternal ancestors are of [place] descent, and paternal ancestors are of [place] descent. There is no reported Ashkenazi Jewish ancestry. There is no known consanguinity.  GENETIC COUNSELING ASSESSMENT: Ms. Tabares is a 85 y.o. female with a personal and family history of cancer, and a known familial mutation in MSH6 in her daughter which is somewhat suggestive of a hereditary syndrome such as Lynch syndrome and predisposition to cancer given her father's young diagnosis of colon cancer and her daughter's diagnosis. We, therefore, discussed and recommended the following at today's visit.   DISCUSSION: We discussed that 5 - 10% of breast cancer is hereditary, with most cases associated with BRCA mutations.  The patient's daughter was diagnosed with Lynch syndrome due to MSH6.  There has been an increased risk for breast cancer found in some families with Lynch syndrome.  The patient had a 50% chance of testing positive for the hereditary mutation found in her daughter, however, the patient's husband could also be affected in light of his family history as well.  We  discussed that we could offer testing for the single mutation found in her daughter, but that a larger genetic testing panel would be appropriate as her risk for a melanoma gene, or other hereditary syndrome is increased based on the family history.We discussed that testing is beneficial for a couple reasons including knowing how to follow individuals after completing their treatment, and understand if other family members could be at risk for cancer and allow them to undergo genetic testing.   We reviewed the characteristics, features and inheritance patterns of hereditary cancer syndromes. We also discussed genetic testing, including the appropriate family members to test, the process of testing, insurance coverage and turn-around-time for results. We discussed the implications of a negative, positive, carrier and/or variant of uncertain significant result. We recommended Ms. Hornback pursue genetic testing for the multi-cancer gene panel. The Multi-Gene Panel offered by [facility] includes sequencing and/or deletion duplication testing of the following 85 genes: AIP, ALK, APC, ATM, AXIN2,BAP1,  BARD1, BLM, BMPR1A, BRCA1, BRCA2, BRIP1, CASR, CDC73, CDH1, CDK4, CDKN1B, CDKN1C, CDKN2A (p14ARF), CDKN2A (p16INK4a), CEBPA, CHEK2, CTNNA1, DICER1, DIS3L2, EGFR (c.2369C>T, p.Thr790Met variant only), EPCAM (Deletion/duplication testing only), FH, FLCN, GATA2, GPC3, GREM1 (Promoter region deletion/duplication testing only), HOXB13 (c.251G>A, p.Gly84Glu), HRAS, KIT, MAX, MEN1, MET, MITF (c.952G>A, p.Glu318Lys variant only), MLH1, MSH2, MSH3, MSH6, MUTYH, NBN, NF1, NF2, NTHL1, PALB2, PDGFRA, PHOX2B, PMS2, POLD1, POLE, POT1, PRKAR1A, PTCH1, PTEN, RAD50, RAD51C, RAD51D, RB1, RECQL4, RET, RNF43, RUNX1, SDHAF2, SDHA (sequence changes only), SDHB, SDHC, SDHD, SMAD4, SMARCA4, SMARCB1, SMARCE1, STK11, SUFU, TERC, TERT, TMEM127, TP53, TSC1, TSC2, VHL, WRN and WT1.    Based on Ms. Leder's personal and family history of cancer, she  meets medical criteria for genetic  testing. Despite that she meets criteria, she may still have an out of pocket cost. We discussed that if her out of pocket cost for testing is over $100, the laboratory will call and confirm whether she wants to proceed with testing.  If the out of pocket cost of testing is less than $100 she will be billed by the genetic testing laboratory.   PLAN: After considering the risks, benefits, and limitations, Ms. Sison provided informed consent to pursue genetic testing and the blood sample was sent to [facility] for analysis of the multi cancer panel. Results should be available within approximately 2-3 weeks' time, at which point they will be disclosed by telephone to Ms. Rund, as will any additional recommendations warranted by these results. Ms. Hoobler will receive a summary of her genetic counseling visit and a copy of her results once available. This information will also be available in Epic.   Lastly, we encouraged Ms. Oscar to remain in contact with cancer genetics annually so that we can continuously update the family history and inform her of any changes in cancer genetics and testing that may be of benefit for this family.   Ms. Dueitt questions were answered to her satisfaction today. Our contact information was provided should additional questions or concerns arise. Thank you for the referral and allowing [place] to share in the care of your patient.   Karen P. Florene Glen, [place], [facility] Licensed, Insurance risk surveyor [email or web address] phone: [phone]  The patient was seen for a total of 45 minutes in face-to-face genetic counseling.  This patient was discussed with Drs. Magrinat, Lindi Adie and/or Burr Medico who agrees with the above.    _______________________________________________________________________ For Office Staff:  Number of people involved in session: 2 Was an Intern/ student involved with case: no

## 2018-11-11 ENCOUNTER — Encounter: Payer: Self-pay | Admitting: Genetic Counselor

## 2018-11-11 DIAGNOSIS — Z1379 Encounter for other screening for genetic and chromosomal anomalies: Secondary | ICD-10-CM | POA: Insufficient documentation

## 2018-11-11 NOTE — Progress Notes (Signed)
Patient ID: Miranda Thompson, female   DOB: [birth year], 85 y.o.   MRN: [id number]   85 y.o. first seen in 2015 for palpitations. Mostly atrial arrhythmias due to her lung disease and bronchiectasis that is followed by Dr [name] and now Dr Windell Norfolk with [facility]   No history of MAT or PAF No bleeding diathesis   Started on Atenolol 05/17/13   Eventually complained of low endurance and stopped at daughters request  Cardizem started l for palpitations and HTN Holter x 2 only shows PACls/ PVC;s   Echo reviewed from 09/27/18 EF 60-65% no valve disease and no pulmonary HTN CT chest 09/14/18 showed some progression of her ILD/Bronchiectasis   ROS: Denies fever, malais, weight loss, blurry vision, decreased visual acuity, cough, sputum, SOB, hemoptysis, pleuritic pain, palpitaitons, heartburn, abdominal pain, melena, lower extremity edema, claudication, or rash.  All other systems reviewed and negative  General: BP (!) 120/56   Pulse 81   Ht 5' 3.75" (1.619 m)   Wt 133 lb (60.3 kg)   LMP 02/23/1985   SpO2 97%   BMI 23.01 kg/m   Affect appropriate Frail elderly female  HEENT: normal Neck supple with no adenopathy JVP normal no bruits no thyromegaly Lungs rhonchi diffuse no  wheezing and good diaphragmatic motion Heart:  S1/S2 no murmur, no rub, gallop or click PMI normal Abdomen: benighn, BS positve, no tenderness, no AAA no bruit.  No HSM or HJR Distal pulses intact with no bruits No edema Neuro non-focal Skin warm and dry No muscular weakness   Current Outpatient Medications  Medication Sig Dispense Refill  . ASPIRIN 81 PO Take by mouth.    . Calcium Carb-Cholecalciferol 600-800 MG-UNIT TABS Take 1 tablet by mouth daily.    Marland Kitchen diltiazem (CARDIZEM CD) 120 MG 24 hr capsule Take 1 capsule (120 mg total) by mouth daily. 90 capsule 1  . Estradiol (YUVAFEM) 10 MCG TABS vaginal tablet Place one tablet  in vagina twice weekly 24 tablet 3  . losartan (COZAAR) 50 MG tablet Take 1 tablet  (50 mg total) by mouth daily. 90 tablet 2  . LUMIGAN 0.01 % SOLN     . Multiple Vitamins-Minerals (MULTIVITAMIN & MINERAL PO) Take 1 tablet by mouth daily.      Marland Kitchen Respiratory Therapy Supplies (FLUTTER) DEVI 1 application by Does not apply route 3 (three) times daily. 1 each 0  . WIXELA INHUB 250-50 MCG/DOSE AEPB TAKE 1 PUFF BY MOUTH EVERY DAY 180 each 1   No current facility-administered medications for this visit.     Allergies  Patient has no known allergies.  Electrocardiogram:  11/23/18 SR rate 81 LAE RAD poor R wave progression   Assessment and Plan  Arrhythmia:  Related to lung disease Atenolol stopped  Stable  No PAF noted  Bronchiectasis:  F/u Icard-  lung exam always abnormal no wheezing activity level good CT progression Vasc:  Taking 81 mg aspirin for prevention HTN:  Beta blocker made her more tired  Better with cardizem and cozaar     Jenkins Rouge

## 2018-11-18 ENCOUNTER — Telehealth: Payer: Self-pay | Admitting: Genetic Counselor

## 2018-11-18 NOTE — Telephone Encounter (Signed)
LM on VM that results are back and to please call. 

## 2018-11-21 ENCOUNTER — Telehealth: Payer: Self-pay | Admitting: Genetic Counselor

## 2018-11-21 ENCOUNTER — Ambulatory Visit: Payer: Self-pay | Admitting: Genetic Counselor

## 2018-11-21 DIAGNOSIS — Z1379 Encounter for other screening for genetic and chromosomal anomalies: Secondary | ICD-10-CM

## 2018-11-21 NOTE — Telephone Encounter (Signed)
Revealed negative genetic testing.  Discussed that we do not know why she has breast cancer or why there is cancer in the family. It could be due to a different gene that we are not testing, or maybe our current technology may not be able to pick something up.  It will be important for her to keep in contact with genetics to keep up with whether additional testing may be needed.  ATM VUS was identified.  This will not change medical management

## 2018-11-21 NOTE — Progress Notes (Signed)
HPI:  Ms. Theilen was previously seen in the [facility] due to a personal and family history of cancer and concerns regarding a hereditary predisposition to cancer. Please refer to our prior cancer genetics clinic note for more information regarding our discussion, assessment and recommendations, at the time. Ms. Simao recent genetic test results were disclosed to her, as were recommendations warranted by these results. These results and recommendations are discussed in more detail below.  CANCER HISTORY:  Oncology History   No history exists.    FAMILY HISTORY:  We obtained a detailed, 4-generation family history.  Significant diagnoses are listed below: Family History  Problem Relation Age of Onset   Heart attack Mother        Deceased age 15   Colon cancer Father 75       deceased age 17 from colon ca.   Hypertension Brother    Colon polyps Daughter    Uterine cancer Daughter 26   Brain cancer Maternal Uncle    Stroke Maternal Grandmother    Other Paternal Uncle 64       possible cancer   Melanoma Daughter 16   Melanoma Niece 25       d. 15   Esophageal cancer Neg Hx    Stomach cancer Neg Hx    Rectal cancer Neg Hx     The patient has five children.  One daughter had melanoma in situ at age 18, and another daughter had uterine cancer at 35.  This daughter was diagnosed with Lynch syndrome.  The patient' has one brother who is cancer free, but his daughter had melanoma in her 43's and died at 69.  The parents are deceased.  The patient's father was diagnosed with colon cancer at 46 and died at 15.  He had one full brother who died 'in pain' at age 23.  He had a paternal half brother and sister who are cancer free.  The paternal grandparents are deceased.  The grandmother died in childbirth, and the grandfather died of unknown reasons.  The patient's mother died of a heart attack at 88.  She had a brother and sister.  The brother had brain  cancer.  The maternal grandparents are deceased from non cancer related issues.  Ms. Osgood is aware of previous family history of genetic testing for hereditary cancer risks. Patient's maternal ancestors are of [place] descent, and paternal ancestors are of [place] descent. There is no reported Ashkenazi Jewish ancestry. There is no known consanguinity.  GENETIC TEST RESULTS: Genetic testing reported out on November 11, 2018 through the Multi-cancer panel found no pathogenic mutations. Ms. Burgoon test was normal and did not reveal the familial mutation. We call this result a true negative result because the cancer-causing mutation was identified in Ms. Uzzle's family, and she did not inherit it.  Given this negative result, Ms. Heckler's chances of developing Lynch syndrome-related cancers are the same as they are in the general population.  The Multi-Gene Panel offered by [facility] includes sequencing and/or deletion duplication testing of the following 85 genes: AIP, ALK, APC, ATM, AXIN2,BAP1,  BARD1, BLM, BMPR1A, BRCA1, BRCA2, BRIP1, CASR, CDC73, CDH1, CDK4, CDKN1B, CDKN1C, CDKN2A (p14ARF), CDKN2A (p16INK4a), CEBPA, CHEK2, CTNNA1, DICER1, DIS3L2, EGFR (c.2369C>T, p.Thr790Met variant only), EPCAM (Deletion/duplication testing only), FH, FLCN, GATA2, GPC3, GREM1 (Promoter region deletion/duplication testing only), HOXB13 (c.251G>A, p.Gly84Glu), HRAS, KIT, MAX, MEN1, MET, MITF (c.952G>A, p.Glu318Lys variant only), MLH1, MSH2, MSH3, MSH6, MUTYH, NBN, NF1, NF2, NTHL1, PALB2, PDGFRA, PHOX2B,  PMS2, POLD1, POLE, POT1, PRKAR1A, PTCH1, PTEN, RAD50, RAD51C, RAD51D, RB1, RECQL4, RET, RNF43, RUNX1, SDHAF2, SDHA (sequence changes only), SDHB, SDHC, SDHD, SMAD4, SMARCA4, SMARCB1, SMARCE1, STK11, SUFU, TERC, TERT, TMEM127, TP53, TSC1, TSC2, VHL, WRN and WT1. The test report has been scanned into EPIC and is located under the Molecular Pathology section of the Results Review tab.  A portion of the result report is  included below for reference.     We discussed with Ms. Rodriques that because current genetic testing is not perfect, it is possible there may be a gene mutation in one of these genes that current testing cannot detect, but that chance is small.  We also discussed, that there could be another gene that has not yet been discovered, or that we have not yet tested, that is responsible for the cancer diagnoses in the family. It is also possible there is a hereditary cause for the cancer in the family that Ms. Farney did not inherit and therefore was not identified in her testing.  Therefore, it is important to remain in touch with cancer genetics in the future so that we can continue to offer Ms. Molock the most up to date genetic testing.   Genetic testing did identify a variant of uncertain significance (VUS) was identified in the ATM gene called c.2222A>G.  At this time, it is unknown if this variant is associated with increased cancer risk or if this is a normal finding, but most variants such as this get reclassified to being inconsequential. It should not be used to make medical management decisions. With time, we suspect the lab will determine the significance of this variant, if any. If we do learn more about it, we will try to contact Ms. Paskett to discuss it further. However, it is important to stay in touch with [place] periodically and keep the address and phone number up to date.  ADDITIONAL GENETIC TESTING: We discussed with Ms. Dolberry that her genetic testing was fairly extensive.  If there are genes identified to increase cancer risk that can be analyzed in the future, we would be happy to discuss and coordinate this testing at that time.    CANCER SCREENING RECOMMENDATIONS: Ms. Mandujano test result is considered negative (normal).  This means that we have not identified a hereditary cause for her personal and family history of cancer at this time. Most cancers happen by chance and this negative test  suggests that her cancer may fall into this category.    While reassuring, this does not definitively rule out a hereditary predisposition to cancer. It is still possible that there could be genetic mutations that are undetectable by current technology. There could be genetic mutations in genes that have not been tested or identified to increase cancer risk.  Therefore, it is recommended she continue to follow the cancer management and screening guidelines provided by her oncology and primary healthcare provider.   An individual's cancer risk and medical management are not determined by genetic test results alone. Overall cancer risk assessment incorporates additional factors, including personal medical history, family history, and any available genetic information that may result in a personalized plan for cancer prevention and surveillance  RECOMMENDATIONS FOR FAMILY MEMBERS:  Individuals in this family might be at some increased risk of developing cancer, over the general population risk, simply due to the family history of cancer.  We recommended women in this family have a yearly mammogram beginning at age 78, or 38 years younger than the earliest  onset of cancer, an annual clinical breast exam, and perform monthly breast self-exams. Women in this family should also have a gynecological exam as recommended by their primary provider. All family members should have a colonoscopy by age 37.  FOLLOW-UP: Lastly, we discussed with Ms. Craton that cancer genetics is a rapidly advancing field and it is possible that new genetic tests will be appropriate for her and/or her family members in the future. We encouraged her to remain in contact with cancer genetics on an annual basis so we can update her personal and family histories and let her know of advances in cancer genetics that may benefit this family.   Our contact number was provided. Ms. Chamberlin questions were answered to her satisfaction, and she  knows she is welcome to call us at anytime with additional questions or concerns.   Roma Kayser, [place], [facility] Licensed, Certified Genetic Counselor [email or web address]

## 2018-11-22 ENCOUNTER — Encounter: Payer: Self-pay | Admitting: Genetic Counselor

## 2018-11-23 ENCOUNTER — Other Ambulatory Visit: Payer: Self-pay

## 2018-11-23 ENCOUNTER — Encounter: Payer: Self-pay | Admitting: Cardiovascular Disease

## 2018-11-23 ENCOUNTER — Ambulatory Visit (INDEPENDENT_AMBULATORY_CARE_PROVIDER_SITE_OTHER): Payer: Medicare Other | Admitting: Cardiovascular Disease

## 2018-11-23 VITALS — BP 120/56 | HR 81 | Ht 63.75 in | Wt 133.0 lb

## 2018-11-23 DIAGNOSIS — R0600 Dyspnea, unspecified: Secondary | ICD-10-CM

## 2018-11-23 DIAGNOSIS — I498 Other specified cardiac arrhythmias: Secondary | ICD-10-CM

## 2018-11-23 DIAGNOSIS — R0609 Other forms of dyspnea: Secondary | ICD-10-CM | POA: Diagnosis not present

## 2018-11-23 NOTE — Patient Instructions (Signed)

## 2018-11-24 ENCOUNTER — Encounter: Payer: Self-pay | Admitting: Internal Medicine

## 2018-11-30 ENCOUNTER — Other Ambulatory Visit: Payer: Self-pay | Admitting: Internal Medicine

## 2018-11-30 DIAGNOSIS — Z20828 Contact with and (suspected) exposure to other viral communicable diseases: Secondary | ICD-10-CM

## 2018-11-30 DIAGNOSIS — Z20822 Contact with and (suspected) exposure to covid-19: Secondary | ICD-10-CM

## 2018-12-16 ENCOUNTER — Other Ambulatory Visit: Payer: Self-pay

## 2018-12-16 ENCOUNTER — Ambulatory Visit (INDEPENDENT_AMBULATORY_CARE_PROVIDER_SITE_OTHER)
Admission: RE | Admit: 2018-12-16 | Discharge: 2018-12-16 | Disposition: A | Discharge status: Home / Self Care | Payer: Medicare Other | Source: Ambulatory Visit | Attending: Pulmonary Disease | Admitting: Pulmonary Disease

## 2018-12-16 DIAGNOSIS — R911 Solitary pulmonary nodule: Secondary | ICD-10-CM

## 2018-12-28 ENCOUNTER — Telehealth: Payer: Self-pay

## 2018-12-28 DIAGNOSIS — R911 Solitary pulmonary nodule: Secondary | ICD-10-CM

## 2018-12-28 NOTE — Telephone Encounter (Signed)
-----   Message from Garner Nash, DO sent at 12/28/2018  8:07 AM EST ----- Miranda Thompson, I sent her a note regarding this. Please schedule follow up CT WO Contrast for 9 months.   Thanks,  BLI  Garner Nash, DO [facility] 12/28/2018 8:07 AM

## 2019-01-10 ENCOUNTER — Encounter: Payer: Self-pay | Admitting: Internal Medicine

## 2019-01-28 IMAGING — CT CT HEAD WO/W CM
3 of 4 series · 13 of 33 positions shown, 16 images · IV contrast (iopamidol)
Comparison: Face CT without contrast 01/02/2003.

CLINICAL DATA: 83-year-old female with unexplained neck and right
occipital pain for several weeks.

EXAM:
CT HEAD WITHOUT AND WITH CONTRAST
TECHNIQUE: Contiguous axial images were obtained from the base of the skull
through the vertex without and with intravenous contrast
CONTRAST:  80mL BMPEBH-X99 IOPAMIDOL (BMPEBH-X99) INJECTION 61%

[Series 2: head wo 5.0 h37s · axial · 0.40mm/px · z∈[+131,+226]mm · 5 of 29 slices shown, 7 images]
[im 5/29  soft-tissue]
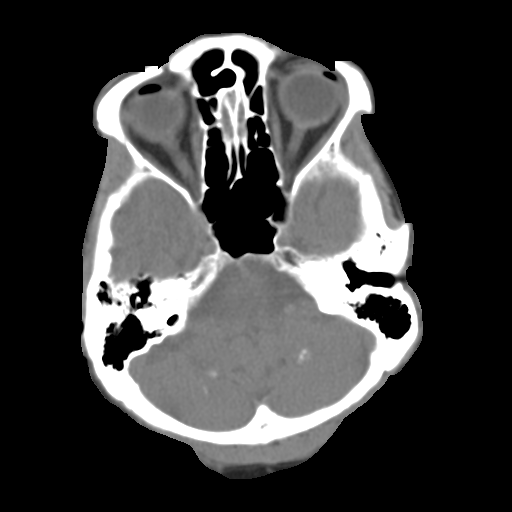
[im 5/29  bone]
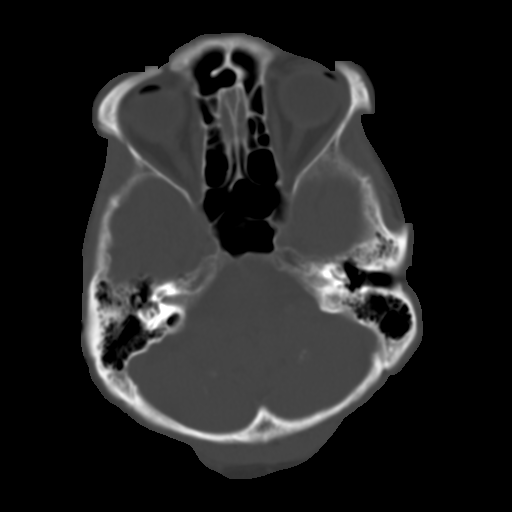
[im 10/29  bone]
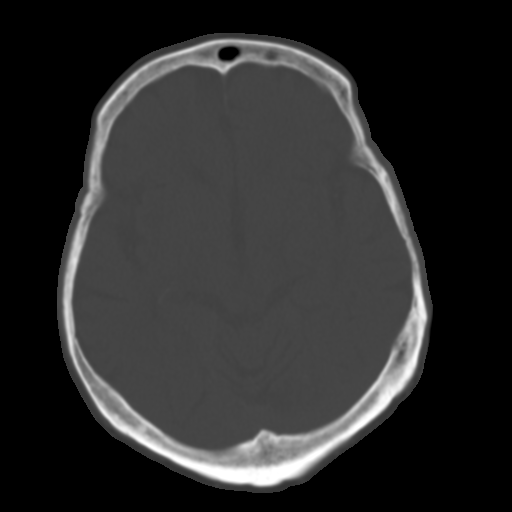
[im 15/29  bone]
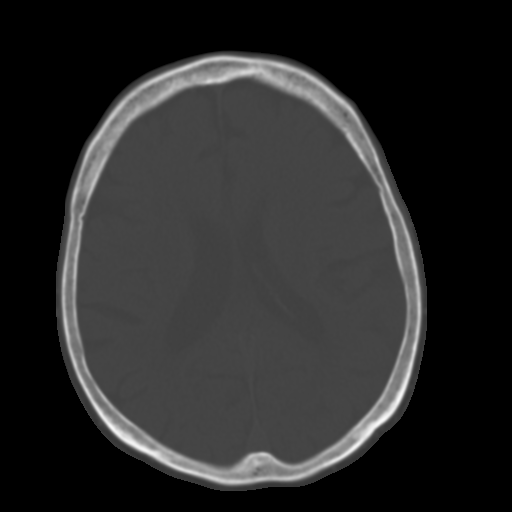
[im 19/29  bone]
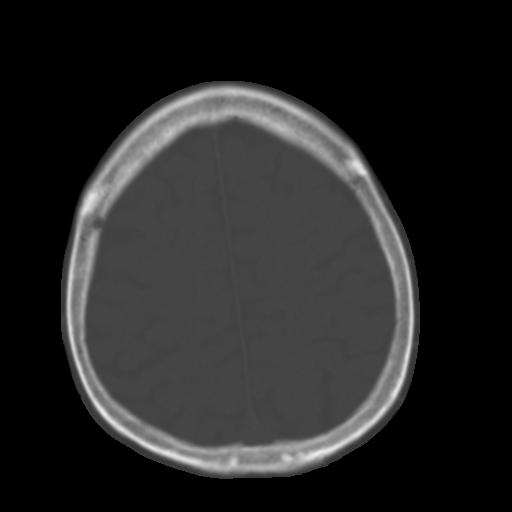
[im 24/29  soft-tissue]
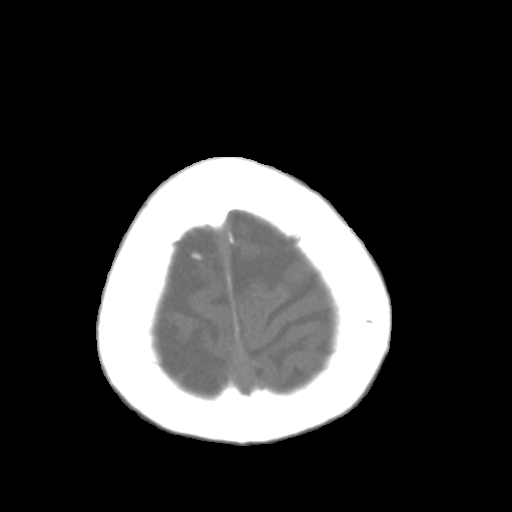
[im 24/29  bone]
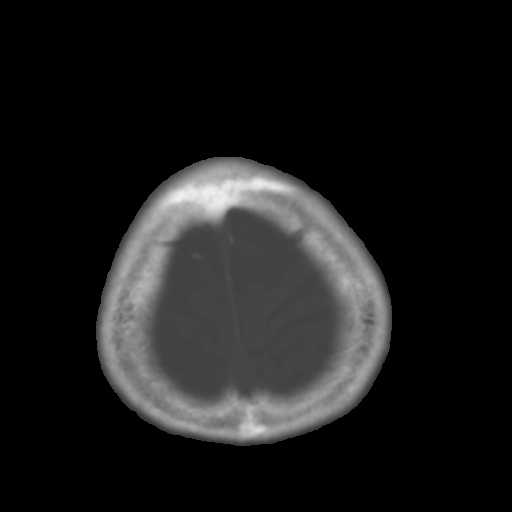

[Series 5: head with 3.0 mpr coronal · coronal · 0.28mm/px · 3 of 61 slices shown]
[im 13/61  bone]
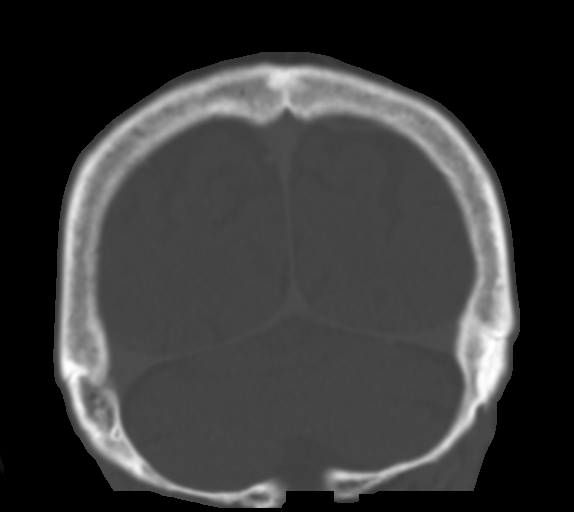
[im 25/61  bone]
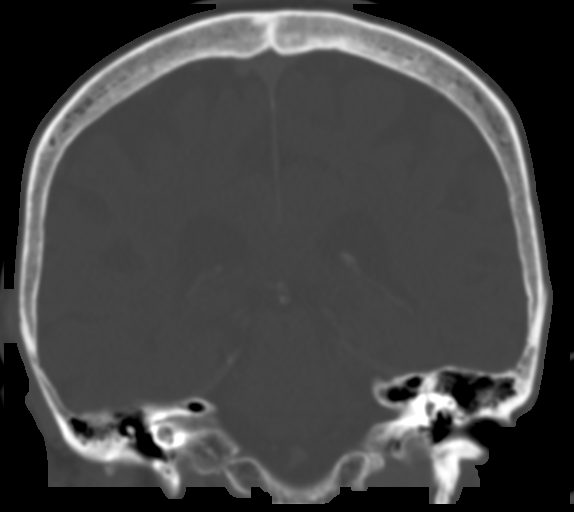
[im 37/61  bone]
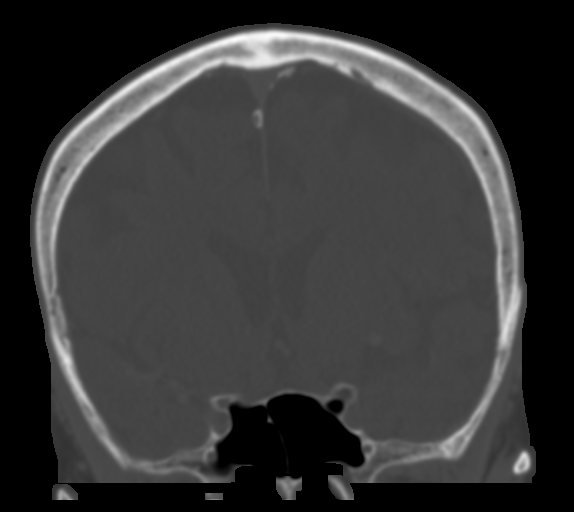

[Series 6: head with 3.0 mpr sagittal · sagittal · 0.28mm/px · 5 of 55 slices shown, 6 images]
[im 19/55  bone]
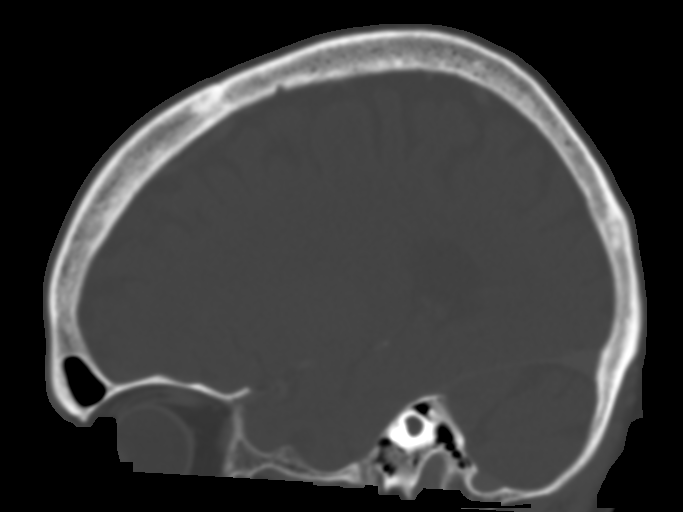
[im 23/55  bone]
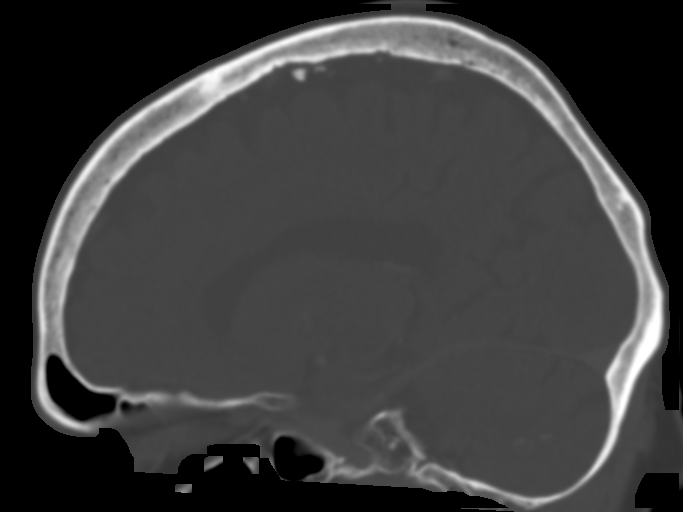
[im 28/55  soft-tissue]
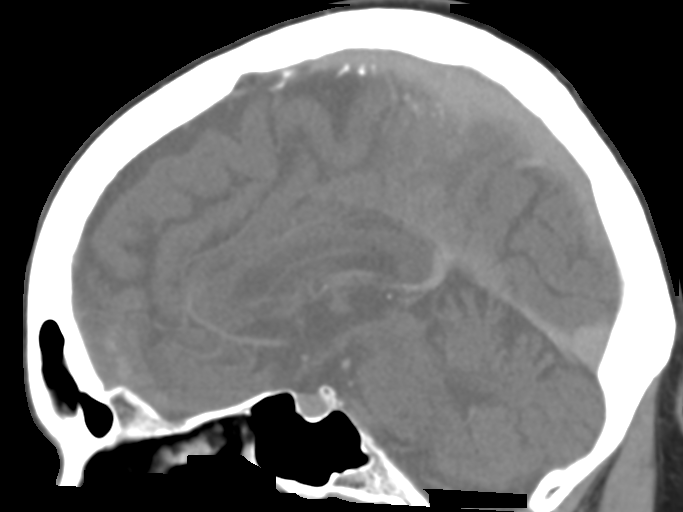
[im 28/55  bone]
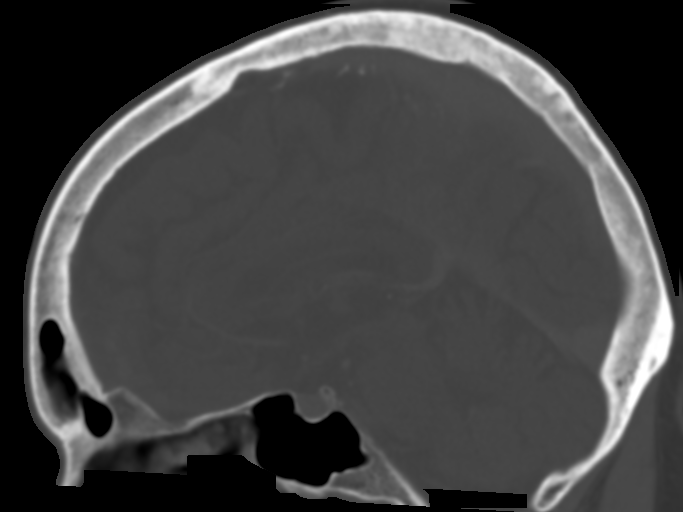
[im 32/55  bone]
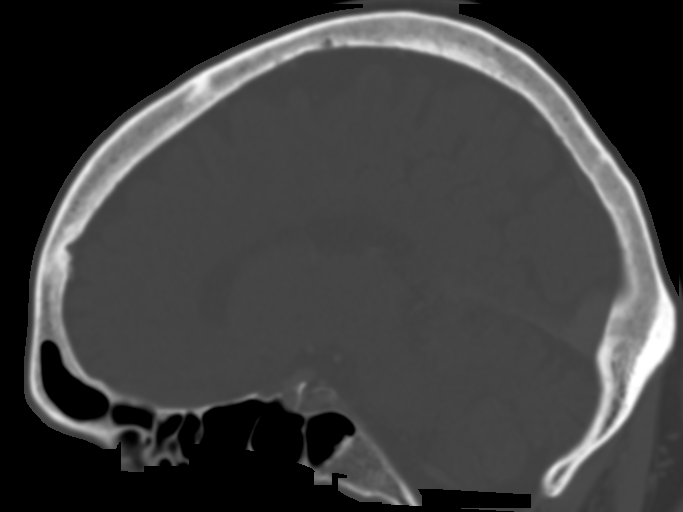
[im 37/55  bone]
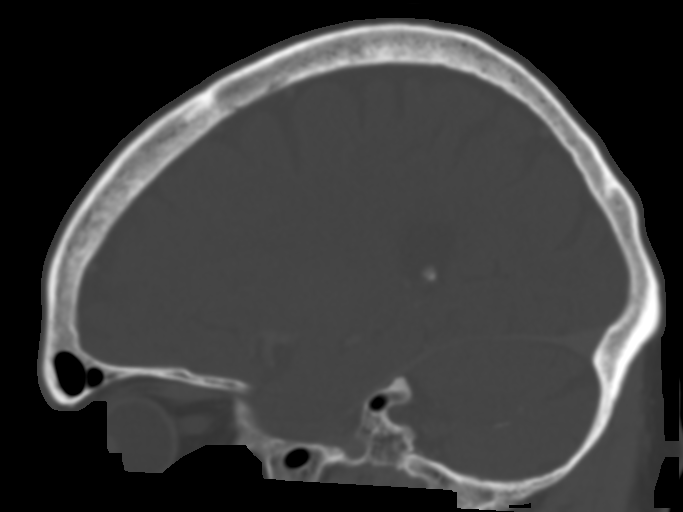

[13 of 33 positions shown; findings below may reference images not displayed]

FINDINGS: Brain: Cerebral volume has decreased in a generalized fashion since
[age] but is normal for age.

No midline shift, ventriculomegaly, mass effect, evidence of mass
lesion, intracranial hemorrhage or evidence of cortically based
acute infarction.

There are small dystrophic calcifications in the bilateral deep
cerebellar nuclei, and lesser dystrophic calcifications in the
bilateral lentiform nuclei. These appear inconsequential. No
encephalomalacia identified. No abnormal enhancement identified.
Gray-white matter differentiation is within normal limits throughout
the brain.

Vascular: Calcified atherosclerosis at the skull base. The major
intracranial vascular structures are enhancing as expected. The left
MCA bifurcation is tortuous but otherwise normal.

Skull: Negative.

Sinuses/Orbits: Clear. The bilateral tympanic cavities and mastoid
air cells are clear.

Other: Visualized scalp soft tissues are within normal limits. The
suboccipital scalp soft tissues appears symmetric and normal.
Visualized orbit soft tissues are within normal limits.
IMPRESSION: Normal for age CT of the Head without and with contrast.

## 2019-02-09 ENCOUNTER — Other Ambulatory Visit: Payer: Self-pay | Admitting: Emergency Medicine

## 2019-02-09 MED ORDER — FLUTICASONE-SALMETEROL 250-50 MCG/DOSE IN AEPB: INHALATION_SPRAY | RESPIRATORY_TRACT | 1 refills | Status: DC

## 2019-02-14 ENCOUNTER — Encounter: Payer: Self-pay | Admitting: Internal Medicine

## 2019-02-14 DIAGNOSIS — E538 Deficiency of other specified B group vitamins: Secondary | ICD-10-CM

## 2019-02-14 DIAGNOSIS — Z0000 Encounter for general adult medical examination without abnormal findings: Secondary | ICD-10-CM

## 2019-02-14 DIAGNOSIS — E611 Iron deficiency: Secondary | ICD-10-CM

## 2019-02-14 DIAGNOSIS — E559 Vitamin D deficiency, unspecified: Secondary | ICD-10-CM

## 2019-02-15 ENCOUNTER — Other Ambulatory Visit: Payer: Self-pay | Admitting: *Deleted

## 2019-02-15 ENCOUNTER — Other Ambulatory Visit (INDEPENDENT_AMBULATORY_CARE_PROVIDER_SITE_OTHER): Payer: Medicare Other

## 2019-02-15 ENCOUNTER — Other Ambulatory Visit: Payer: Self-pay

## 2019-02-15 DIAGNOSIS — E559 Vitamin D deficiency, unspecified: Secondary | ICD-10-CM

## 2019-02-15 DIAGNOSIS — Z0000 Encounter for general adult medical examination without abnormal findings: Secondary | ICD-10-CM

## 2019-02-15 DIAGNOSIS — E611 Iron deficiency: Secondary | ICD-10-CM

## 2019-02-15 DIAGNOSIS — E538 Deficiency of other specified B group vitamins: Secondary | ICD-10-CM | POA: Diagnosis not present

## 2019-02-15 LAB — CBC WITH DIFFERENTIAL/PLATELET
Basophils Absolute: 0.1 10*3/uL (ref 0.0–0.1)
Basophils Relative: 1 % (ref 0.0–3.0)
Eosinophils Absolute: 0.1 10*3/uL (ref 0.0–0.7)
Eosinophils Relative: 1.5 % (ref 0.0–5.0)
HCT: 35.8 % — ABNORMAL LOW (ref 36.0–46.0)
Hemoglobin: 12.1 g/dL (ref 12.0–15.0)
Lymphocytes Relative: 16.3 % (ref 12.0–46.0)
Lymphs Abs: 1 10*3/uL (ref 0.7–4.0)
MCHC: 33.9 g/dL (ref 30.0–36.0)
MCV: 97.6 fl (ref 78.0–100.0)
Monocytes Absolute: 0.6 10*3/uL (ref 0.1–1.0)
Monocytes Relative: 9.1 % (ref 3.0–12.0)
Neutro Abs: 4.5 10*3/uL (ref 1.4–7.7)
Neutrophils Relative %: 72.1 % (ref 43.0–77.0)
Platelets: 243 10*3/uL (ref 150.0–400.0)
RBC: 3.67 Mil/uL — ABNORMAL LOW (ref 3.87–5.11)
RDW: 12.7 % (ref 11.5–15.5)
WBC: 6.2 10*3/uL (ref 4.0–10.5)

## 2019-02-15 LAB — IBC PANEL
Iron: 88 ug/dL (ref 42–145)
Saturation Ratios: 27.1 % (ref 20.0–50.0)
Transferrin: 232 mg/dL (ref 212.0–360.0)

## 2019-02-15 LAB — HEPATIC FUNCTION PANEL
ALT: 14 U/L (ref 0–35)
AST: 23 U/L (ref 0–37)
Albumin: 4 g/dL (ref 3.5–5.2)
Alkaline Phosphatase: 67 U/L (ref 39–117)
Bilirubin, Direct: 0.2 mg/dL (ref 0.0–0.3)
Total Bilirubin: 0.7 mg/dL (ref 0.2–1.2)
Total Protein: 7 g/dL (ref 6.0–8.3)

## 2019-02-15 LAB — URINALYSIS, ROUTINE W REFLEX MICROSCOPIC
Bilirubin Urine: NEGATIVE
Hgb urine dipstick: NEGATIVE
Ketones, ur: NEGATIVE
Nitrite: NEGATIVE
RBC / HPF: NONE SEEN (ref 0–?)
Specific Gravity, Urine: 1.01 (ref 1.000–1.030)
Total Protein, Urine: NEGATIVE
Urine Glucose: NEGATIVE
Urobilinogen, UA: 0.2 (ref 0.0–1.0)
pH: 5.5 (ref 5.0–8.0)

## 2019-02-15 LAB — BASIC METABOLIC PANEL
BUN: 15 mg/dL (ref 6–23)
CO2: 27 mEq/L (ref 19–32)
Calcium: 9.1 mg/dL (ref 8.4–10.5)
Chloride: 95 mEq/L — ABNORMAL LOW (ref 96–112)
Creatinine, Ser: 0.64 mg/dL (ref 0.40–1.20)
GFR: 88.46 mL/min (ref >60.00)
Glucose, Bld: 120 mg/dL — ABNORMAL HIGH (ref 70–99)
Potassium: 4 mEq/L (ref 3.5–5.1)
Sodium: 130 mEq/L — ABNORMAL LOW (ref 135–145)

## 2019-02-15 LAB — LIPID PANEL
Cholesterol: 173 mg/dL (ref 0–200)
HDL: 62.7 mg/dL (ref >39.00)
LDL Cholesterol: 93 mg/dL (ref 0–99)
NonHDL: 110.06
Total CHOL/HDL Ratio: 3
Triglycerides: 83 mg/dL (ref 0.0–149.0)
VLDL: 16.6 mg/dL (ref 0.0–40.0)

## 2019-02-15 LAB — TSH: TSH: 3.32 u[IU]/mL (ref 0.35–4.50)

## 2019-02-16 LAB — VITAMIN B12: Vitamin B-12: 620 pg/mL (ref 211–911)

## 2019-02-16 LAB — VITAMIN D 25 HYDROXY (VIT D DEFICIENCY, FRACTURES): VITD: 42.49 ng/mL (ref 30.00–100.00)

## 2019-02-20 ENCOUNTER — Encounter: Payer: Self-pay | Admitting: Internal Medicine

## 2019-02-20 ENCOUNTER — Ambulatory Visit (INDEPENDENT_AMBULATORY_CARE_PROVIDER_SITE_OTHER): Payer: Medicare Other | Admitting: Internal Medicine

## 2019-02-20 ENCOUNTER — Other Ambulatory Visit: Payer: Self-pay

## 2019-02-20 VITALS — BP 126/68 | HR 78 | Temp 98.3°F | Ht 63.5 in | Wt 135.8 lb

## 2019-02-20 DIAGNOSIS — Z0000 Encounter for general adult medical examination without abnormal findings: Secondary | ICD-10-CM

## 2019-02-20 DIAGNOSIS — R739 Hyperglycemia, unspecified: Secondary | ICD-10-CM | POA: Insufficient documentation

## 2019-02-20 DIAGNOSIS — E559 Vitamin D deficiency, unspecified: Secondary | ICD-10-CM | POA: Diagnosis not present

## 2019-02-20 NOTE — Progress Notes (Signed)
Subjective:    Patient ID: Miranda Thompson, female    DOB: 07/24/1933, 85 y.o.   MRN: [id number]  HPI  Here for wellness and f/u;  Overall doing ok;  Pt denies Chest pain, worsening SOB, DOE, wheezing, orthopnea, PND, worsening LE edema, palpitations, dizziness or syncope.  Pt denies neurological change such as new headache, facial or extremity weakness.  Pt denies polydipsia, polyuria, or low sugar symptoms. Pt states overall good compliance with treatment and medications, good tolerability, and has been trying to follow appropriate diet.  Pt denies worsening depressive symptoms, suicidal ideation or panic. No fever, night sweats, wt loss, loss of appetite, or other constitutional symptoms.  Pt states good ability with ADL's, has low fall risk, home safety reviewed and adequate, no other significant changes in hearing or vision, and only occasionally active with exercise. Has a sort of "heavy legs feeling" when walking but no pain to the back or legs, concerned her cbc today might be related, but cbc is normal and realizes her copd may be the issue  Past Medical History:  Diagnosis Date  . Adenomatous colon polyp   . Allergic rhinitis   . Anemia    pt states no anemia in her past hx thst she is aware of   . Atrial fibrillation ([place])    goes in and out of this rhythm- takes Tenormin  . Breast cancer, left breast ([facility]) 1994 with lumpectomy   chemo,tamox,radiation  . Bronchiectasis with acute exacerbation ([facility])   . COPD (chronic obstructive pulmonary disease) ([place])   . Diverticulosis of colon   . DJD (degenerative joint disease)   . Dysrhythmia    palpitations  . Family history of brain cancer   . Family history of colon cancer 02/14/2018  . Family history of colon cancer   . Family history of malignant neoplasm of gastrointestinal tract   . Family history of melanoma   . Family history of uterine cancer   . Fibromyalgia   . GERD (gastroesophageal reflux disease) 02/14/2018  . Glaucoma    . Hemoptysis   . History of pneumonia   . Hypercholesteremia    pt denies  . Hypertension   . Low back pain syndrome   . Neuromuscular disorder ([facility])    hx fibromyalgia   . Osteopenia   . Pneumonia    hx  . Pseudomonas infection   . Rotator cuff syndrome of right shoulder   . Spinal stenosis    Past Surgical History:  Procedure Laterality Date  . COLONOSCOPY    . left breast lumpectomy and LN dissection  06/1992   Dr. [name]  . LUNG BIOPSY  1968   TSurg---Dr. [name]  . LUNG BIOPSY  2012   dr [name]  . POLYPECTOMY    . REVERSE SHOULDER ARTHROPLASTY Right 08/02/2015   Procedure: RIGHT REVERSE TOTAL SHOULDER ARTHROPLASTY;  Surgeon: [name], MD;  Location: [facility];  Service: Orthopedics;  Laterality: Right;  . REVERSE TOTAL SHOULDER ARTHROPLASTY Right 08/02/2015  . TONSILLECTOMY    . TOTAL KNEE ARTHROPLASTY Right 01/21/2015   Procedure: TOTAL RIGHT KNEE ARTHROPLASTY;  Surgeon: [name], MD;  Location: [facility];  Service: Orthopedics;  Laterality: Right;  . TOTAL KNEE ARTHROPLASTY Left 03/23/2016   Procedure: LEFT TOTAL KNEE ARTHROPLASTY;  Surgeon: [name], MD;  Location: [facility];  Service: Orthopedics;  Laterality: Left;    reports that she quit smoking about 52 years ago. Her smoking use included cigarettes. She has a 10.00 pack-year smoking  history. She has never used smokeless tobacco. She reports current alcohol use of about 1.0 standard drinks of alcohol per week. She reports that she does not use drugs. family history includes Brain cancer in her maternal uncle; Colon cancer (age of onset: 69) in her father; Colon polyps in her daughter; Heart attack in her mother; Hypertension in her brother; Melanoma (age of onset: 34) in her niece; Melanoma (age of onset: 44) in her daughter; Other (age of onset: 79) in her paternal uncle; Stroke in her maternal grandmother; Uterine cancer (age of onset: 50) in her daughter. No Known Allergies Current Outpatient Medications on File  Prior to Visit  Medication Sig Dispense Refill  . ASPIRIN 81 PO Take by mouth.    . Calcium Carb-Cholecalciferol 600-800 MG-UNIT TABS Take 1 tablet by mouth daily.    Marland Kitchen diltiazem (CARDIZEM CD) 120 MG 24 hr capsule Take 1 capsule (120 mg total) by mouth daily. 90 capsule 1  . Estradiol (YUVAFEM) 10 MCG TABS vaginal tablet Place one tablet  in vagina twice weekly 24 tablet 3  . Fluticasone-Salmeterol (WIXELA INHUB) 250-50 MCG/DOSE AEPB TAKE 1 PUFF BY MOUTH EVERY DAY 180 each 1  . losartan (COZAAR) 50 MG tablet Take 1 tablet (50 mg total) by mouth daily. 90 tablet 2  . LUMIGAN 0.01 % SOLN     . Multiple Vitamins-Minerals (MULTIVITAMIN & MINERAL PO) Take 1 tablet by mouth daily.      Marland Kitchen Respiratory Therapy Supplies (FLUTTER) DEVI 1 application by Does not apply route 3 (three) times daily. 1 each 0   No current facility-administered medications on file prior to visit.   Review of Systems  Constitutional: Negative for other unusual diaphoresis or sweats HENT: Negative for ear discharge or swelling Eyes: Negative for other worsening visual disturbances Respiratory: Negative for stridor or other swelling  Gastrointestinal: Negative for worsening distension or other blood Genitourinary: Negative for retention or other urinary change Musculoskeletal: Negative for other MSK pain or swelling Skin: Negative for color change or other new lesions Neurological: Negative for worsening tremors and other numbness  Psychiatric/Behavioral: Negative for worsening agitation or other fatigue All otherwise neg per pt     Objective:   Physical Exam BP 126/68   Pulse 78   Temp 98.3 F (36.8 C) (Oral)   Ht 5' 3.5" (1.613 m)   Wt 135 lb 12.8 oz (61.6 kg)   LMP 02/23/1985   SpO2 98%   BMI 23.68 kg/m  VS noted,  Constitutional: Pt appears in NAD HENT: Head: NCAT.  Right Ear: External ear normal.  Left Ear: External ear normal.  Eyes: . Pupils are equal, round, and reactive to light. Conjunctivae and  EOM are normal Nose: without d/c or deformity Neck: Neck supple. Gross normal ROM Cardiovascular: Normal rate and regular rhythm.   Pulmonary/Chest: Effort normal and breath sounds without rales or wheezing.  Abd:  Soft, NT, ND, + BS, no organomegaly Neurological: Pt is alert. At baseline orientation, motor grossly intact Skin: Skin is warm. No rashes, other new lesions, no LE edema Psychiatric: Pt behavior is normal without agitation  All otherwise neg per pt Lab Results  Component Value Date   WBC 6.2 02/15/2019   HGB 12.1 02/15/2019   HCT 35.8 (L) 02/15/2019   PLT 243.0 02/15/2019   GLUCOSE 120 (H) 02/15/2019   CHOL 173 02/15/2019   TRIG 83.0 02/15/2019   HDL 62.70 02/15/2019   LDLCALC 93 02/15/2019   ALT 14 02/15/2019   AST 23  02/15/2019   NA 130 (L) 02/15/2019   K 4.0 02/15/2019   CL 95 (L) 02/15/2019   CREATININE 0.64 02/15/2019   BUN 15 02/15/2019   CO2 27 02/15/2019   TSH 3.32 02/15/2019   INR 1.06 01/14/2015          Assessment & Plan:

## 2019-02-20 NOTE — Assessment & Plan Note (Signed)

## 2019-02-20 NOTE — Assessment & Plan Note (Signed)
For a1c with next labs  

## 2019-02-20 NOTE — Patient Instructions (Signed)
Please continue all other medications as before, and refills have been done if requested.  Please have the pharmacy call with any other refills you may need.  Please continue your efforts at being more active, low cholesterol diet, and weight control.  You are otherwise up to date with prevention measures today.  Please keep your appointments with your specialists as you may have planned  Please return in 1 year for your yearly visit, or sooner if needed, with Lab testing done 3-5 days before  

## 2019-03-01 ENCOUNTER — Encounter: Payer: Self-pay | Admitting: Internal Medicine

## 2019-03-07 ENCOUNTER — Other Ambulatory Visit: Payer: Self-pay | Admitting: Internal Medicine

## 2019-03-07 NOTE — Telephone Encounter (Signed)
Please refill as per office routine med refill policy (all routine meds refilled for 3 mo or monthly per pt preference up to one year from last visit, then month to month grace period for 3 mo, then further med refills will have to be denied)  

## 2019-03-08 DIAGNOSIS — H43813 Vitreous degeneration, bilateral: Secondary | ICD-10-CM | POA: Diagnosis not present

## 2019-03-08 DIAGNOSIS — H2513 Age-related nuclear cataract, bilateral: Secondary | ICD-10-CM | POA: Diagnosis not present

## 2019-03-08 DIAGNOSIS — H04123 Dry eye syndrome of bilateral lacrimal glands: Secondary | ICD-10-CM | POA: Diagnosis not present

## 2019-03-08 DIAGNOSIS — H35372 Puckering of macula, left eye: Secondary | ICD-10-CM | POA: Diagnosis not present

## 2019-03-08 DIAGNOSIS — H401131 Primary open-angle glaucoma, bilateral, mild stage: Secondary | ICD-10-CM | POA: Diagnosis not present

## 2019-03-09 ENCOUNTER — Other Ambulatory Visit: Payer: Self-pay | Admitting: Internal Medicine

## 2019-03-22 ENCOUNTER — Other Ambulatory Visit: Payer: Self-pay

## 2019-03-22 ENCOUNTER — Ambulatory Visit (INDEPENDENT_AMBULATORY_CARE_PROVIDER_SITE_OTHER): Payer: Medicare PPO | Admitting: Pulmonary Disease

## 2019-03-22 ENCOUNTER — Encounter: Payer: Self-pay | Admitting: Pulmonary Disease

## 2019-03-22 VITALS — BP 118/78 | HR 88 | Ht 63.5 in | Wt 133.6 lb

## 2019-03-22 DIAGNOSIS — R093 Abnormal sputum: Secondary | ICD-10-CM | POA: Diagnosis not present

## 2019-03-22 DIAGNOSIS — Z853 Personal history of malignant neoplasm of breast: Secondary | ICD-10-CM | POA: Diagnosis not present

## 2019-03-22 DIAGNOSIS — R918 Other nonspecific abnormal finding of lung field: Secondary | ICD-10-CM | POA: Diagnosis not present

## 2019-03-22 DIAGNOSIS — R911 Solitary pulmonary nodule: Secondary | ICD-10-CM

## 2019-03-22 DIAGNOSIS — J479 Bronchiectasis, uncomplicated: Secondary | ICD-10-CM | POA: Diagnosis not present

## 2019-03-22 MED ORDER — STIOLTO RESPIMAT 2.5-2.5 MCG/ACT IN AERS: 2.0000 | INHALATION_SPRAY | Freq: Every day | RESPIRATORY_TRACT | 0 refills | Status: DC

## 2019-03-22 NOTE — Patient Instructions (Addendum)
Thank you for visiting Dr. Valeta Harms at [facility]. Today we recommend the following:  Meds ordered this encounter  Medications  . Tiotropium Bromide-Olodaterol (STIOLTO RESPIMAT) 2.5-2.5 MCG/ACT AERS    Sig: Inhale 2 puffs into the lungs daily.    Dispense:  4 g    Refill:  0   Return in about 9 months (around 12/20/2019). after CT Chest complete.     Please do your part to reduce the spread of COVID-19.

## 2019-03-22 NOTE — Progress Notes (Signed)
Synopsis: Referred in July 2020 for est care from Dr. [name], PCP: [name], MD  Subjective:   PATIENT ID: Miranda Thompson GENDER: female DOB: 03/20/1933, MRN: [id number]  Chief Complaint  Patient presents with  . Follow-up    Pt states she has been doing good since last visit. States she has been having heaviness in her legs and states she believes her breathing has gotten some worse.    Former patient of Dr. [name].  Followed for COPD and bronchiectasis.  Here today to establish care.  She is currently managed with a flutter valve and individual postural drainage at home.  She exercises regularly.  She rides a stationary Peloton bicycle 6 times per week.  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  She believes that her first lung injury occurred when she was washing her husband's clothes many years ago in the basement when she decided to mix Clorox bleach and ammonium.  She was a horrible gas fumes that came out of the basement washing machine that drove her out of the home.  She states her flutter valve works some of the times but does not work as well as postural drainage.  As she is getting older she has become weaker and unable to do these at home.  She currently lives alone is unable to do CPT or have someone to help her with CPT.  She has not had axial CT imaging of the chest since 2004.  She did have breast cancer in the early 90s status post radiation to the left side following a lumpectomy.  She has daily cough and sputum production.  No recent fevers.  OV 03/22/2019: Patient seen today for follow-up in the office for her COPD and bronchiectasis.  Currently symptoms are at baseline.  She does have daily sputum production.  This is better with postural drainage daily.  Currently using her inhaler regimen and.  She is on a ICS/LABA.  Patient has several questions today.  Including Covid vaccine questions.  All of these were addressed  during the office visit.  Patient has received her first Covid vaccine.   Past Medical History:  Diagnosis Date  . Adenomatous colon polyp   . Allergic rhinitis   . Anemia    pt states no anemia in her past hx thst she is aware of   . Atrial fibrillation ([place])    goes in and out of this rhythm- takes Tenormin  . Breast cancer, left breast ([place]) 1994 with lumpectomy   chemo,tamox,radiation  . Bronchiectasis with acute exacerbation ([place])   . COPD (chronic obstructive pulmonary disease) ([place])   . Diverticulosis of colon   . DJD (degenerative joint disease)   . Dysrhythmia    palpitations  . Family history of brain cancer   . Family history of colon cancer 02/14/2018  . Family history of colon cancer   . Family history of malignant neoplasm of gastrointestinal tract   . Family history of melanoma   . Family history of uterine cancer   . Fibromyalgia   . GERD (gastroesophageal reflux disease) 02/14/2018  . Glaucoma   . Hemoptysis   . History of pneumonia   . Hypercholesteremia    pt denies  . Hypertension   . Low back pain syndrome   . Neuromuscular disorder ([facility])    hx fibromyalgia   . Osteopenia   . Pneumonia    hx  . Pseudomonas infection   .  Rotator cuff syndrome of right shoulder   . Spinal stenosis      Family History  Problem Relation Age of Onset  . Heart attack Mother        Deceased age 64  . Colon cancer Father 38       deceased age 64 from colon ca.  . Hypertension Brother   . Colon polyps Daughter   . Uterine cancer Daughter 67  . Brain cancer Maternal Uncle   . Stroke Maternal Grandmother   . Other Paternal Uncle 12       possible cancer  . Melanoma Daughter 10  . Melanoma Niece 25       d. 39  . Esophageal cancer Neg Hx   . Stomach cancer Neg Hx   . Rectal cancer Neg Hx      Past Surgical History:  Procedure Laterality Date  . COLONOSCOPY    . left breast lumpectomy and LN dissection  06/1992   Dr. [name]  . LUNG BIOPSY  1968    TSurg---Dr. [name]  . LUNG BIOPSY  2012   dr [name]  . POLYPECTOMY    . REVERSE SHOULDER ARTHROPLASTY Right 08/02/2015   Procedure: RIGHT REVERSE TOTAL SHOULDER ARTHROPLASTY;  Surgeon: [name], MD;  Location: [facility];  Service: Orthopedics;  Laterality: Right;  . REVERSE TOTAL SHOULDER ARTHROPLASTY Right 08/02/2015  . TONSILLECTOMY    . TOTAL KNEE ARTHROPLASTY Right 01/21/2015   Procedure: TOTAL RIGHT KNEE ARTHROPLASTY;  Surgeon: [name], MD;  Location: [facility];  Service: Orthopedics;  Laterality: Right;  . TOTAL KNEE ARTHROPLASTY Left 03/23/2016   Procedure: LEFT TOTAL KNEE ARTHROPLASTY;  Surgeon: [name], MD;  Location: [facility];  Service: Orthopedics;  Laterality: Left;    Social History   Socioeconomic History  . Marital status: Married    Spouse name: Not on file  . Number of children: 5  . Years of education: Not on file  . Highest education level: Not on file  Occupational History  . Occupation: Retired  Tobacco Use  . Smoking status: Former Smoker    Packs/day: 0.25    Years: 40.00    Pack years: 10.00    Types: Cigarettes    Quit date: 02/24/1967    Years since quitting: 52.1  . Smokeless tobacco: Never Used  Substance and Sexual Activity  . Alcohol use: Yes    Alcohol/week: 1.0 standard drinks    Types: 1 Standard drinks or equivalent per week    Comment: occasionally  . Drug use: No  . Sexual activity: Not Currently    Partners: Male    Comment: husband vasectomy  Other Topics Concern  . Not on file  Social History Narrative  . Not on file   Social Determinants of Health   Financial Resource Strain:   . Difficulty of Paying Living Expenses: Not on file  Food Insecurity:   . Worried About Charity fundraiser in the Last Year: Not on file  . Ran Out of Food in the Last Year: Not on file  Transportation Needs:   . Lack of Transportation (Medical): Not on file  . Lack of Transportation (Non-Medical): Not on file  Physical Activity:   . Days of  Exercise per Week: Not on file  . Minutes of Exercise per Session: Not on file  Stress:   . Feeling of Stress : Not on file  Social Connections:   . Frequency of Communication with Friends and Family: Not on file  .  Frequency of Social Gatherings with Friends and Family: Not on file  . Attends Religious Services: Not on file  . Active Member of Clubs or Organizations: Not on file  . Attends Archivist Meetings: Not on file  . Marital Status: Not on file  Intimate Partner Violence:   . Fear of Current or Ex-Partner: Not on file  . Emotionally Abused: Not on file  . Physically Abused: Not on file  . Sexually Abused: Not on file     No Known Allergies   Outpatient Medications Prior to Visit  Medication Sig Dispense Refill  . ASPIRIN 81 PO Take by mouth.    . Calcium Carb-Cholecalciferol 600-800 MG-UNIT TABS Take 1 tablet by mouth daily.    Marland Kitchen diltiazem (CARDIZEM CD) 120 MG 24 hr capsule TAKE 1 CAPSULE BY MOUTH EVERY DAY 90 capsule 2  . Estradiol (YUVAFEM) 10 MCG TABS vaginal tablet Place one tablet  in vagina twice weekly 24 tablet 3  . Fluticasone-Salmeterol (WIXELA INHUB) 250-50 MCG/DOSE AEPB TAKE 1 PUFF BY MOUTH EVERY DAY 180 each 1  . losartan (COZAAR) 50 MG tablet TAKE 1 TABLET BY MOUTH EVERY DAY 90 tablet 2  . LUMIGAN 0.01 % SOLN     . Multiple Vitamins-Minerals (MULTIVITAMIN & MINERAL PO) Take 1 tablet by mouth daily.      Marland Kitchen Respiratory Therapy Supplies (FLUTTER) DEVI 1 application by Does not apply route 3 (three) times daily. 1 each 0  . Saccharomyces boulardii (FLORASTOR PO) Take 1 capsule by mouth daily.    . Zinc 30 MG CAPS Take 1 capsule by mouth daily.     No facility-administered medications prior to visit.    Review of Systems  Constitutional: Negative for chills, fever, malaise/fatigue and weight loss.  HENT: Negative for hearing loss, sore throat and tinnitus.   Eyes: Negative for blurred vision and double vision.  Respiratory: Positive for sputum  production and shortness of breath. Negative for cough, hemoptysis, wheezing and stridor.   Cardiovascular: Negative for chest pain, palpitations, orthopnea, leg swelling and PND.  Gastrointestinal: Negative for abdominal pain, constipation, diarrhea, heartburn, nausea and vomiting.  Genitourinary: Negative for dysuria, hematuria and urgency.  Musculoskeletal: Negative for joint pain and myalgias.  Skin: Negative for itching and rash.  Neurological: Negative for dizziness, tingling, weakness and headaches.  Endo/Heme/Allergies: Negative for environmental allergies. Does not bruise/bleed easily.  Psychiatric/Behavioral: Negative for depression. The patient is not nervous/anxious and does not have insomnia.   All other systems reviewed and are negative.    Objective:  Physical Exam Vitals reviewed.  Constitutional:      General: She is not in acute distress.    Appearance: She is well-developed.  HENT:     Head: Normocephalic and atraumatic.  Eyes:     General: No scleral icterus.    Conjunctiva/sclera: Conjunctivae normal.     Pupils: Pupils are equal, round, and reactive to light.  Neck:     Vascular: No JVD.     Trachea: No tracheal deviation.  Cardiovascular:     Rate and Rhythm: Normal rate and regular rhythm.     Heart sounds: Normal heart sounds. No murmur.  Pulmonary:     Effort: Pulmonary effort is normal. No tachypnea, accessory muscle usage or respiratory distress.     Breath sounds: Normal breath sounds. No stridor. No wheezing, rhonchi or rales.     Comments: Bilateral bronchial breath sounds Abdominal:     Palpations: Abdomen is soft.  Musculoskeletal:  General: Deformity present. No tenderness.     Cervical back: Neck supple.     Comments: Areas of muscle wasting  Lymphadenopathy:     Cervical: No cervical adenopathy.  Skin:    General: Skin is warm and dry.     Capillary Refill: Capillary refill takes less than 2 seconds.     Findings: No rash.   Neurological:     Mental Status: She is alert and oriented to person, place, and time.  Psychiatric:        Behavior: Behavior normal.      Vitals:   03/22/19 1432  BP: 118/78  Pulse: 88  SpO2: 96%  Weight: 133 lb 9.6 oz (60.6 kg)  Height: 5' 3.5" (1.613 m)   96% on RA BMI Readings from Last 3 Encounters:  03/22/19 23.29 kg/m  02/20/19 23.68 kg/m  11/23/18 23.01 kg/m   Wt Readings from Last 3 Encounters:  03/22/19 133 lb 9.6 oz (60.6 kg)  02/20/19 135 lb 12.8 oz (61.6 kg)  11/23/18 133 lb (60.3 kg)    Chest Imaging:  CT Chest in 2004: IMPRESSION 1.  MULTIPLE AREAS OF BRONCHIECTATIC CHANGE WITH ASSOCIATED DISTAL ALVEOLAR INFILTRATES SUGGESTING POSTOBSTRUCTIVE PNEUMONITIS AND/OR ATELECTASIS. SEVERAL OF THE DILATED BRONCHI SHOW INTRALUMINAL MATERIAL AND SUGGEST THE PRESENCE OF MUCOUS PLUGS. 2.  AREA OF FOCAL PLEURAL-BASED DENSITY IN THE RIGHT LOWER WHICH DOES NOT APPEAR TO BE RELATED TO ASSOCIATED BRONCHIECTASIS AND IS MORE WORRISOME FOR AN AREA OF FOCAL NEOPLASTIC CHANGE OR POSSIBLY A FOCUS OF ROUNDED PNEUMONIA.   AN AREA OF FOCAL INFARCTION WOULD BE A THIRD CONSIDERATION IN THE APPROPRIATE CLINICAL SETTING.  SHORT-TERM FOLLOW-UP WITH CT CAN BE UNDERTAKEN TO ASSESS FOR SOME DEGREE OF INTERVAL RESOLUTION IN TWO TO THREE WEEKS' TIME AS MIGHT BE EXPECTED WITH EITHER A PNEUMONITIS OR A RESOLVING AREA OF INFARCTION.  IF NO INTERVAL RESOLUTION IS APPRECIATED OVER THIS PERIOD OF TIME, THEN A NEOPLASTIC PROCESS WOULD BE A MORE WORRISOME CONSIDERATION AND TISSUE SAMPLING WOULD BE RECOMMENDED AT THAT POINT.  2018 chest x-ray: Chronic interstitial changes within the bilateral bases. The patient's images have been independently reviewed by me.    12/16/2018 CT chest: Areas of cystic bronchiectasis within the lower lobes, dilated bronchi with mucus plugging.  Patient also has few scattered small lung nodules.  Enlarged pulmonary arteries concerning for pulmonary hypertension.  The patient's images have been independently reviewed by me.    Pulmonary Functions Testing Results: No flowsheet data found.   FeNO: None   Pathology: None   Echocardiogram:   09/2018: IMPRESSIONS    1. The left ventricle has normal systolic function with an ejection fraction of 60-65%. The cavity size was normal. Left ventricular diastolic Doppler parameters are consistent with pseudonormalization.  2. The right ventricle has normal systolic function. The cavity was normal. There is no increase in right ventricular wall thickness.  3. No evidence of mitral valve stenosis.  4. No stenosis of the aortic valve.  5. The aorta is normal in size and structure.  6. The aortic root and ascending aorta are normal in size and structure.  7. Grossly normal.  Heart Catheterization: None     Assessment & Plan:     ICD-10-CM   1. Bronchiectasis without complication ([place])  A999333   2. History of left breast cancer  Z85.3   3. Increased sputum production  R09.3   4. Solitary pulmonary nodule  R91.1   5. Multiple pulmonary nodules  R91.8     Assessment:  This is an 86 year old female with a longstanding history of bronchiectasis.  Currently managed with postural drainage and flutter valve for airway clearance techniques.  She has not used a vest before.  She did not have this delivered to her home after last office visit.  At this time she currently manages her respiratory symptoms and obstructive physiology with a ICS/LABA.  She still has daily sputum production and shortness of breath.  Of note she has had her first Covid vaccine.  Plan Following Extensive Data Review & Interpretation:  . I reviewed prior external note(s) from Dr. Cathlean Cower 02/20/2019, primary care . I reviewed the result(s) of sodium 130, serum creatinine 0.64, bicarb 27, white blood cell count 6.2.  Echocardiogram reviewed with normal ejection fraction normal right ventricular function no evidence of pulmonary  hypertension. . I have ordered pulmonary function test to be completed prior to next office visit.,  Repeat noncontrasted CT of the chest to be completed October 2021 for follow-up of lung nodules.  Independent interpretation of tests . Review of patient's CT imaging October 2020 of the chest revealed cystic bronchiectasis multiple pulmonary nodules. The patient's images have been independently reviewed by me.    Patient was previously managed on a ICS/LABA.  Due to bronchiectasis we will get her off of this medication and switch her to [employer] Respimat.  Samples given of this medication today and new prescription sent to pharmacy.  Patient to return to our clinic in 9 months following PFTs and CT nodule follow-up.    Garner Nash, DO [facility] 03/22/2019 2:50 PM

## 2019-04-05 ENCOUNTER — Encounter: Payer: Self-pay | Admitting: Internal Medicine

## 2019-05-01 MED ORDER — STIOLTO RESPIMAT 2.5-2.5 MCG/ACT IN AERS: 2.0000 | INHALATION_SPRAY | Freq: Every day | RESPIRATORY_TRACT | 3 refills | Status: DC

## 2019-05-03 MED ORDER — STIOLTO RESPIMAT 2.5-2.5 MCG/ACT IN AERS: 2.0000 | INHALATION_SPRAY | Freq: Every day | RESPIRATORY_TRACT | 6 refills | Status: DC

## 2019-05-16 ENCOUNTER — Encounter: Payer: Self-pay | Admitting: Certified Nurse Midwife

## 2019-06-09 ENCOUNTER — Other Ambulatory Visit: Payer: Self-pay

## 2019-06-09 ENCOUNTER — Ambulatory Visit: Payer: Self-pay | Admitting: Obstetrics & Gynecology

## 2019-06-09 ENCOUNTER — Ambulatory Visit (INDEPENDENT_AMBULATORY_CARE_PROVIDER_SITE_OTHER): Payer: Medicare PPO | Admitting: Obstetrics and Gynecology

## 2019-06-09 ENCOUNTER — Encounter: Payer: Self-pay | Admitting: Obstetrics and Gynecology

## 2019-06-09 ENCOUNTER — Telehealth: Payer: Self-pay

## 2019-06-09 VITALS — BP 128/64 | HR 84 | Temp 97.4°F | Resp 10 | Wt 134.0 lb

## 2019-06-09 DIAGNOSIS — N3090 Cystitis, unspecified without hematuria: Secondary | ICD-10-CM | POA: Diagnosis not present

## 2019-06-09 DIAGNOSIS — N760 Acute vaginitis: Secondary | ICD-10-CM

## 2019-06-09 LAB — POCT URINALYSIS DIPSTICK
Bilirubin, UA: NEGATIVE
Glucose, UA: NEGATIVE
Ketones, UA: NEGATIVE
Nitrite, UA: NEGATIVE
Protein, UA: NEGATIVE
Urobilinogen, UA: 0.2 E.U./dL
pH, UA: 6 (ref 5.0–8.0)

## 2019-06-09 MED ORDER — SULFAMETHOXAZOLE-TRIMETHOPRIM 800-160 MG PO TABS: 1.0000 | ORAL_TABLET | Freq: Two times a day (BID) | ORAL | 0 refills | Status: DC

## 2019-06-09 MED ORDER — PHENAZOPYRIDINE HCL 200 MG PO TABS: 200.0000 mg | ORAL_TABLET | Freq: Three times a day (TID) | ORAL | 0 refills | Status: DC | PRN

## 2019-06-09 MED ORDER — BETAMETHASONE VALERATE 0.1 % EX OINT: 1.0000 "application " | TOPICAL_OINTMENT | Freq: Two times a day (BID) | CUTANEOUS | 0 refills | Status: DC

## 2019-06-09 NOTE — Telephone Encounter (Signed)
AEX 09/09/18 with DL.  Hx of UTIs- last 01/2019. PMP, On HRT- Yuvafem    Spoke with pt. Pt reports having UTI sx of frequency, itching, pelvic pain, abd bloating x 1 -2 days, but noticed it more last night. Denies fever, chills and vaginal bleeding or discharge. Advised to be seen for OV for evaluation.  OV scheduled with Dr Talbert Nan at 1030 am today as work-in appt. . Pt agreeable and verbalized understanding. CPS Neg.   Routing to Dr Talbert Nan for review.  Encounter closed.

## 2019-06-09 NOTE — Telephone Encounter (Signed)
Patient called needing appointment for UTI. Need triage to assist in scheduling.

## 2019-06-09 NOTE — Patient Instructions (Signed)
Vaginitis Vaginitis is a condition in which the vaginal tissue swells and becomes red (inflamed). This condition is most often caused by a change in the normal balance of bacteria and yeast that live in the vagina. This change causes an overgrowth of certain bacteria or yeast, which causes the inflammation. There are different types of vaginitis, but the most common types are:  Bacterial vaginosis.  Yeast infection (candidiasis).  Trichomoniasis vaginitis. This is a sexually transmitted disease (STD).  Viral vaginitis.  Atrophic vaginitis.  Allergic vaginitis. What are the causes? The cause of this condition depends on the type of vaginitis. It can be caused by:  Bacteria (bacterial vaginosis).  Yeast, which is a fungus (yeast infection).  A parasite (trichomoniasis vaginitis).  A virus (viral vaginitis).  Low hormone levels (atrophic vaginitis). Low hormone levels can occur during pregnancy, breastfeeding, or after menopause.  Irritants, such as bubble baths, scented tampons, and feminine sprays (allergic vaginitis). Other factors can change the normal balance of the yeast and bacteria that live in the vagina. These include:  Antibiotic medicines.  Poor hygiene.  Diaphragms, vaginal sponges, spermicides, birth control pills, and intrauterine devices (IUD).  Sex.  Infection.  Uncontrolled diabetes.  A weakened defense (immune) system. What increases the risk? This condition is more likely to develop in women who:  Smoke.  Use vaginal douches, scented tampons, or scented sanitary pads.  Wear tight-fitting pants.  Wear thong underwear.  Use oral birth control pills or an IUD.  Have sex without a condom.  Have multiple sex partners.  Have an STD.  Frequently use the spermicide nonoxynol-9.  Eat lots of foods high in sugar.  Have uncontrolled diabetes.  Have low estrogen levels.  Have a weakened immune system from an immune disorder or medical  treatment.  Are pregnant or breastfeeding. What are the signs or symptoms? Symptoms vary depending on the cause of the vaginitis. Common symptoms include:  Abnormal vaginal discharge. ? The discharge is white, gray, or yellow with bacterial vaginosis. ? The discharge is thick, white, and cheesy with a yeast infection. ? The discharge is frothy and yellow or greenish with trichomoniasis.  A bad vaginal smell. The smell is fishy with bacterial vaginosis.  Vaginal itching, pain, or swelling.  Sex that is painful.  Pain or burning when urinating. Sometimes there are no symptoms. How is this diagnosed? This condition is diagnosed based on your symptoms and medical history. A physical exam, including a pelvic exam, will also be done. You may also have other tests, including:  Tests to determine the pH level (acidity or alkalinity) of your vagina.  A whiff test, to assess the odor that results when a sample of your vaginal discharge is mixed with a potassium hydroxide solution.  Tests of vaginal fluid. A sample will be examined under a microscope. How is this treated? Treatment varies depending on the type of vaginitis you have. Your treatment may include:  Antibiotic creams or pills to treat bacterial vaginosis and trichomoniasis.  Antifungal medicines, such as vaginal creams or suppositories, to treat a yeast infection.  Medicine to ease discomfort if you have viral vaginitis. Your sexual partner should also be treated.  Estrogen delivered in a cream, pill, suppository, or vaginal ring to treat atrophic vaginitis. If vaginal dryness occurs, lubricants and moisturizing creams may help. You may need to avoid scented soaps, sprays, or douches.  Stopping use of a product that is causing allergic vaginitis. Then using a vaginal cream to treat the symptoms. Follow   these instructions at home: Lifestyle  Keep your genital area clean and dry. Avoid soap, and only rinse the area with  water.  Do not douche or use tampons until your health care provider says it is okay to do so. Use sanitary pads, if needed.  Do not have sex until your health care provider approves. When you can return to sex, practice safe sex and use condoms.  Wipe from front to back. This avoids the spread of bacteria from the rectum to the vagina. General instructions  Take over-the-counter and prescription medicines only as told by your health care provider.  If you were prescribed an antibiotic medicine, take or use it as told by your health care provider. Do not stop taking or using the antibiotic even if you start to feel better.  Keep all follow-up visits as told by your health care provider. This is important. How is this prevented?  Use mild, non-scented products. Do not use things that can irritate the vagina, such as fabric softeners. Avoid the following products if they are scented: ? Feminine sprays. ? Detergents. ? Tampons. ? Feminine hygiene products. ? Soaps or bubble baths.  Let air reach your genital area. ? Wear cotton underwear to reduce moisture buildup. ? Avoid wearing underwear while you sleep. ? Avoid wearing tight pants and underwear or nylons without a cotton panel. ? Avoid wearing thong underwear.  Take off any wet clothing, such as bathing suits, as soon as possible.  Practice safe sex and use condoms. Contact a health care provider if:  You have abdominal pain.  You have a fever.  You have symptoms that last for more than 2-3 days. Get help right away if:  You have a fever and your symptoms suddenly get worse. Summary  Vaginitis is a condition in which the vaginal tissue becomes inflamed.This condition is most often caused by a change in the normal balance of bacteria and yeast that live in the vagina.  Treatment varies depending on the type of vaginitis you have.  Do not douche, use tampons , or have sex until your health care provider approves. When  you can return to sex, practice safe sex and use condoms. This information is not intended to replace advice given to you by your health care provider. Make sure you discuss any questions you have with your health care provider. Document Revised: 01/22/2017 Document Reviewed: 03/17/2016 Elsevier Patient Education  2020 Elsevier Inc. Urinary Tract Infection, Adult  A urinary tract infection (UTI) is an infection of any part of the urinary tract. The urinary tract includes the kidneys, ureters, bladder, and urethra. These organs make, store, and get rid of urine in the body. Your health care provider may use other names to describe the infection. An upper UTI affects the ureters and kidneys (pyelonephritis). A lower UTI affects the bladder (cystitis) and urethra (urethritis). What are the causes? Most urinary tract infections are caused by bacteria in your genital area, around the entrance to your urinary tract (urethra). These bacteria grow and cause inflammation of your urinary tract. What increases the risk? You are more likely to develop this condition if:  You have a urinary catheter that stays in place (indwelling).  You are not able to control when you urinate or have a bowel movement (you have incontinence).  You are female and you: ? Use a spermicide or diaphragm for birth control. ? Have low estrogen levels. ? Are pregnant.  You have certain genes that increase your risk (  genetics).  You are sexually active.  You take antibiotic medicines.  You have a condition that causes your flow of urine to slow down, such as: ? An enlarged prostate, if you are female. ? Blockage in your urethra (stricture). ? A kidney stone. ? A nerve condition that affects your bladder control (neurogenic bladder). ? Not getting enough to drink, or not urinating often.  You have certain medical conditions, such as: ? Diabetes. ? A weak disease-fighting system (immunesystem). ? Sickle cell  disease. ? Gout. ? Spinal cord injury. What are the signs or symptoms? Symptoms of this condition include:  Needing to urinate right away (urgently).  Frequent urination or passing small amounts of urine frequently.  Pain or burning with urination.  Blood in the urine.  Urine that smells bad or unusual.  Trouble urinating.  Cloudy urine.  Vaginal discharge, if you are female.  Pain in the abdomen or the lower back. You may also have:  Vomiting or a decreased appetite.  Confusion.  Irritability or tiredness.  A fever.  Diarrhea. The first symptom in older adults may be confusion. In some cases, they may not have any symptoms until the infection has worsened. How is this diagnosed? This condition is diagnosed based on your medical history and a physical exam. You may also have other tests, including:  Urine tests.  Blood tests.  Tests for sexually transmitted infections (STIs). If you have had more than one UTI, a cystoscopy or imaging studies may be done to determine the cause of the infections. How is this treated? Treatment for this condition includes:  Antibiotic medicine.  Over-the-counter medicines to treat discomfort.  Drinking enough water to stay hydrated. If you have frequent infections or have other conditions such as a kidney stone, you may need to see a health care provider who specializes in the urinary tract (urologist). In rare cases, urinary tract infections can cause sepsis. Sepsis is a life-threatening condition that occurs when the body responds to an infection. Sepsis is treated in the hospital with IV antibiotics, fluids, and other medicines. Follow these instructions at home:  Medicines  Take over-the-counter and prescription medicines only as told by your health care provider.  If you were prescribed an antibiotic medicine, take it as told by your health care provider. Do not stop using the antibiotic even if you start to feel  better. General instructions  Make sure you: ? Empty your bladder often and completely. Do not hold urine for long periods of time. ? Empty your bladder after sex. ? Wipe from front to back after a bowel movement if you are female. Use each tissue one time when you wipe.  Drink enough fluid to keep your urine pale yellow.  Keep all follow-up visits as told by your health care provider. This is important. Contact a health care provider if:  Your symptoms do not get better after 1-2 days.  Your symptoms go away and then return. Get help right away if you have:  Severe pain in your back or your lower abdomen.  A fever.  Nausea or vomiting. Summary  A urinary tract infection (UTI) is an infection of any part of the urinary tract, which includes the kidneys, ureters, bladder, and urethra.  Most urinary tract infections are caused by bacteria in your genital area, around the entrance to your urinary tract (urethra).  Treatment for this condition often includes antibiotic medicines.  If you were prescribed an antibiotic medicine, take it as told by your   health care provider. Do not stop using the antibiotic even if you start to feel better.  Keep all follow-up visits as told by your health care provider. This is important. This information is not intended to replace advice given to you by your health care provider. Make sure you discuss any questions you have with your health care provider. Document Revised: 01/27/2018 Document Reviewed: 08/19/2017 Elsevier Patient Education  2020 Elsevier Inc.  

## 2019-06-09 NOTE — Progress Notes (Signed)
GYNECOLOGY  VISIT   HPI: 86 y.o.   Married White or Caucasian Not Hispanic or Latino  female   [phone] with Patient's last menstrual period was 02/23/1985.   here for UTI symptoms. Patient complains of frequent urination, bloating, pelvic pressure, vaginal itching, lower back pain, and "slight" burning".  She c/o a several day h/o urinary frequency, developed the urgency yesterday. Voiding small amounts.  She c/o external vulvar itching, she has dysuria which started yesterday, not sure if external or internal dysuria. She has intermittent mid right back pain. No fevers. No vaginal bleeding.   Urine: Cloudy, trace RBC, 2+ WBC  GYNECOLOGIC HISTORY: Patient's last menstrual period was 02/23/1985. Contraception:Postmenopausal Menopausal hormone therapy: Yuvafem        OB History    Gravida  5   Para  5   Term  5   Preterm      AB      Living  5     SAB      TAB      Ectopic      Multiple      Live Births  5              Patient Active Problem List   Diagnosis Date Noted  . Hyperglycemia 02/20/2019  . Genetic testing 11/11/2018  . Family history of uterine cancer   . Family history of brain cancer   . Family history of melanoma   . GERD (gastroesophageal reflux disease) 02/14/2018  . Glaucoma 02/14/2018  . Family history of colon cancer 02/14/2018  . Preventative health care 02/14/2018  . Osteoporosis 02/14/2018  . History of left breast cancer 01/13/2018  . S/P knee replacement 03/23/2016  . S/P shoulder replacement 08/02/2015  . Palpitations 08/20/2014  . Episode of hypertension 08/20/2014  . COPD, moderate ([place]) 05/09/2014  . Premature atrial contractions 11/08/2013  . Cardiac arrhythmia 05/10/2013  . Trochanteric bursitis of left hip 02/02/2012  . Osteoarthritis of left hip 06/25/2011  . Leg length inequality 06/25/2011  . Left ovarian cyst 01/20/2011  . Elevated lipids 11/04/2009  . Acute cystitis 11/04/2009  . LOW BACK PAIN SYNDROME  05/07/2008  . Bronchiectasis with acute exacerbation ([place]) 03/18/2007  . Hemoptysis 03/18/2007  . PNEUMONIA 03/07/2007  . Disorder of bone and cartilage 03/07/2007  . BRONCHIECTASIS 03/04/2007  . Osteoarthritis 03/04/2007    Past Medical History:  Diagnosis Date  . Adenomatous colon polyp   . Allergic rhinitis   . Anemia    pt states no anemia in her past hx thst she is aware of   . Atrial fibrillation ([place])    goes in and out of this rhythm- takes Tenormin  . Breast cancer, left breast ([place]) 1994 with lumpectomy   chemo,tamox,radiation  . Bronchiectasis with acute exacerbation ([place])   . COPD (chronic obstructive pulmonary disease) ([place])   . Diverticulosis of colon   . DJD (degenerative joint disease)   . Dysrhythmia    palpitations  . Family history of brain cancer   . Family history of colon cancer 02/14/2018  . Family history of colon cancer   . Family history of malignant neoplasm of gastrointestinal tract   . Family history of melanoma   . Family history of uterine cancer   . Fibromyalgia   . GERD (gastroesophageal reflux disease) 02/14/2018  . Glaucoma   . Hemoptysis   . History of pneumonia   . Hypercholesteremia    pt denies  . Hypertension   . Low back  pain syndrome   . Neuromuscular disorder ([facility])    hx fibromyalgia   . Osteopenia   . Pneumonia    hx  . Pseudomonas infection   . Rotator cuff syndrome of right shoulder   . Spinal stenosis     Past Surgical History:  Procedure Laterality Date  . COLONOSCOPY    . left breast lumpectomy and LN dissection  06/1992   Dr. [name]  . LUNG BIOPSY  1968   TSurg---Dr. [name]  . LUNG BIOPSY  2012   dr [name]  . POLYPECTOMY    . REVERSE SHOULDER ARTHROPLASTY Right 08/02/2015   Procedure: RIGHT REVERSE TOTAL SHOULDER ARTHROPLASTY;  Surgeon: [name], MD;  Location: [facility];  Service: Orthopedics;  Laterality: Right;  . REVERSE TOTAL SHOULDER ARTHROPLASTY Right 08/02/2015  . TONSILLECTOMY    . TOTAL KNEE  ARTHROPLASTY Right 01/21/2015   Procedure: TOTAL RIGHT KNEE ARTHROPLASTY;  Surgeon: [name], MD;  Location: [facility];  Service: Orthopedics;  Laterality: Right;  . TOTAL KNEE ARTHROPLASTY Left 03/23/2016   Procedure: LEFT TOTAL KNEE ARTHROPLASTY;  Surgeon: [name], MD;  Location: [facility];  Service: Orthopedics;  Laterality: Left;    Current Outpatient Medications  Medication Sig Dispense Refill  . amoxicillin (AMOXIL) 500 MG capsule Take prior to dental procedures    . ASPIRIN 81 PO Take by mouth.    . Calcium Carb-Cholecalciferol 600-800 MG-UNIT TABS Take 1 tablet by mouth daily.    Marland Kitchen diltiazem (CARDIZEM CD) 120 MG 24 hr capsule TAKE 1 CAPSULE BY MOUTH EVERY DAY 90 capsule 2  . Estradiol (YUVAFEM) 10 MCG TABS vaginal tablet Place one tablet  in vagina twice weekly 24 tablet 3  . losartan (COZAAR) 50 MG tablet TAKE 1 TABLET BY MOUTH EVERY DAY 90 tablet 2  . LUMIGAN 0.01 % SOLN     . Multiple Vitamins-Minerals (MULTIVITAMIN & MINERAL PO) Take 1 tablet by mouth daily.      . Nutritional Supplements (VITAMIN D BOOSTER PO) Take by mouth.    . Respiratory Therapy Supplies (FLUTTER) DEVI 1 application by Does not apply route 3 (three) times daily. 1 each 0  . Saccharomyces boulardii (FLORASTOR PO) Take 1 capsule by mouth daily.    . Zinc 30 MG CAPS Take 1 capsule by mouth daily.    . Tiotropium Bromide-Olodaterol (STIOLTO RESPIMAT) 2.5-2.5 MCG/ACT AERS Inhale 2 puffs into the lungs daily. 4 g 6   No current facility-administered medications for this visit.     ALLERGIES: Ciprofloxacin, vaginal itching   Family History  Problem Relation Age of Onset  . Heart attack Mother        Deceased age 79  . Colon cancer Father 71       deceased age 41 from colon ca.  . Hypertension Brother   . Colon polyps Daughter   . Uterine cancer Daughter 29  . Brain cancer Maternal Uncle   . Stroke Maternal Grandmother   . Other Paternal Uncle 12       possible cancer  . Melanoma Daughter 56  .  Melanoma Niece 25       d. 6  . Esophageal cancer Neg Hx   . Stomach cancer Neg Hx   . Rectal cancer Neg Hx     Social History   Socioeconomic History  . Marital status: Married    Spouse name: Not on file  . Number of children: 5  . Years of education: Not on file  . Highest education level:  Not on file  Occupational History  . Occupation: Retired  Tobacco Use  . Smoking status: Former Smoker    Packs/day: 0.25    Years: 40.00    Pack years: 10.00    Types: Cigarettes    Quit date: 02/24/1967    Years since quitting: 52.3  . Smokeless tobacco: Never Used  Substance and Sexual Activity  . Alcohol use: Yes    Alcohol/week: 1.0 standard drinks    Types: 1 Standard drinks or equivalent per week    Comment: occasionally  . Drug use: No  . Sexual activity: Not Currently    Partners: Male    Comment: husband vasectomy  Other Topics Concern  . Not on file  Social History Narrative  . Not on file   Social Determinants of Health   Financial Resource Strain:   . Difficulty of Paying Living Expenses:   Food Insecurity:   . Worried About Charity fundraiser in the Last Year:   . Arboriculturist in the Last Year:   Transportation Needs:   . Film/video editor (Medical):   Marland Kitchen Lack of Transportation (Non-Medical):   Physical Activity:   . Days of Exercise per Week:   . Minutes of Exercise per Session:   Stress:   . Feeling of Stress :   Social Connections:   . Frequency of Communication with Friends and Family:   . Frequency of Social Gatherings with Friends and Family:   . Attends Religious Services:   . Active Member of Clubs or Organizations:   . Attends Archivist Meetings:   Marland Kitchen Marital Status:   Intimate Partner Violence:   . Fear of Current or Ex-Partner:   . Emotionally Abused:   Marland Kitchen Physically Abused:   . Sexually Abused:     Review of Systems  Constitutional: Negative.   HENT: Negative.   Eyes: Negative.   Respiratory: Negative.    Cardiovascular: Negative.   Gastrointestinal:       Bloating  Genitourinary: Positive for frequency.       Pelvic pressure Vaginal itching Burning  Musculoskeletal: Positive for back pain.  Skin: Negative.   Neurological: Negative.   Endo/Heme/Allergies: Negative.   Psychiatric/Behavioral: Negative.     PHYSICAL EXAMINATION:    BP 128/64 (BP Location: Right Arm, Patient Position: Sitting, Cuff Size: Normal)   Pulse 84   Temp (!) 97.4 F (36.3 C) (Temporal)   Resp 10   Wt 134 lb (60.8 kg)   LMP 02/23/1985   BMI 23.36 kg/m     General appearance: alert, cooperative and appears stated age Abdomen: soft, mildly tender in the suprapubic region; non distended, no masses,  no organomegaly  Pelvic: External genitalia:  no lesions, + erythema              Urethra:  normal appearing urethra with no masses, tenderness or lesions              Bartholins and Skenes: normal                 Vagina: atrophic appearing vagina with anterior varicosities noted             Cervix: no lesions              Bimanual Exam:  Uterus:  normal size, contour, position, consistency, mobility, non-tender              Adnexa: no mass, fullness, tenderness  Bladder: tender to palpation  Chaperone was present for exam.  Wet prep: ? clue, no trich, + wbc, + parabasilar cells KOH: no yeast PH: 4.5   ASSESSMENT Cystitis Vulvovaginitis, ? Clue on wet prep    PLAN Send urine for ua, c&s Send nuswab vaginitis panel Treat with bactrim, pyridium, steroid ointment.    An After Visit Summary was printed and given to the patient.

## 2019-06-10 LAB — URINALYSIS, MICROSCOPIC ONLY
Bacteria, UA: NONE SEEN
Casts: NONE SEEN /lpf

## 2019-06-11 LAB — URINE CULTURE

## 2019-06-12 LAB — NUSWAB BV AND CANDIDA, NAA
Candida albicans, NAA: NEGATIVE
Candida glabrata, NAA: NEGATIVE

## 2019-06-16 ENCOUNTER — Telehealth: Payer: Self-pay | Admitting: Obstetrics and Gynecology

## 2019-06-16 ENCOUNTER — Encounter: Payer: Self-pay | Admitting: Obstetrics and Gynecology

## 2019-06-16 NOTE — Telephone Encounter (Signed)
[facility] Clinical [facility]  Phone Number: [phone]  Hi Dr. Talbert Nan,   I read my lab reports, I understand that I did have a bacterial infection, I assume it was not severe, and that I do not have vaginitis.   I still have urinary frequency, not the terrible urgency, which is nice. I have finished one tube of Betamethasone Valerate ointment and have another yet to go. I still have vulva itching, but, just pesky, not bad. If it is not vaginitis, what could be a cause, I bought huge cotton panties and have been wearing loose pants, so I look forward to more improvement after the final tube of ointment.   I just wanted you to know how I was progressing.   I hope that you have a lovely weekend.   [name]  06-24-1933

## 2019-06-16 NOTE — Telephone Encounter (Signed)
Call to patient, left detailed message on home number, ok per dpr. Advised as seen below per Dr. Talbert Nan. Advised our office phones go off at 4pm, return call to office at [phone] on Monday to schedule nurse visit or if any additional questions.

## 2019-06-16 NOTE — Telephone Encounter (Signed)
Please call and speak with the patient. If she is still having mild urinary symptoms, please have her return for another nurse visit to send urine for ua, c&s.  It is okay for her to use the steroid ointment for up to 2 weeks, not after that. Her vaginitis panel was negative for infection. She can use Vaseline as needed. If her symptoms don't improve she should be seen again.

## 2019-06-16 NOTE — Telephone Encounter (Signed)
Patient was seen in office on 06/09/19 and tx for UTI with Bactrim DS bid x3 days.   Routing to Dr. Talbert Nan

## 2019-06-20 NOTE — Telephone Encounter (Signed)
Spoke with patient. Patient reports she is feeling much better, denies any urinary symptoms. Has stopped steroid ointment and switched to Vaseline, itching has improved. Patient is aware to contact the office if symptoms do not completely resolve or new symptoms develop.   Routing to provider for final review. Patient is agreeable to disposition. Will close encounter.

## 2019-06-29 NOTE — Telephone Encounter (Signed)
Dr. Valeta Harms please advise on patient mychart message:  I must not have read the information sheet that came with the Stiolto Respimat, when I skimmed it, I did see that I already had many of the possible side effects such as chest pain and shortness of breath since I have been diagnosed with COPD; however, I have been taking Lumigan for narrow angle glaucoma and have noticed vision changes and have blamed the Lumigan for the changes.  The Stiolto Respirant info sheet describes symptoms pertaining to vision that I have been experiencing, I have about 4 months of Wixela on hand that I had purchased before I switched to Stiolto, should I go back to that, or cool it for awhile?  Thanks,  [facility] [birth year]

## 2019-06-29 NOTE — Telephone Encounter (Signed)
ATC patient in regards to recs from Dr. Valeta Harms. LMTCB  Yes you can switch back to wixela  It is possible for the stiolto to effect vision but its very rare and the systemic absorption of the inhaled drug is very small.  If you are having vision changes I would let your ophthalmologist know as well  Lance Muss, DO  [facility]  06/29/2019 12:44 PM

## 2019-06-30 NOTE — Telephone Encounter (Signed)
Called and spoke with patient about recs from [place]. Patient expressed understanding. Nothing further needed at this time

## 2019-06-30 NOTE — Telephone Encounter (Signed)
Pt called back, please return call anytime this morning.

## 2019-07-05 DIAGNOSIS — H04123 Dry eye syndrome of bilateral lacrimal glands: Secondary | ICD-10-CM | POA: Diagnosis not present

## 2019-07-05 DIAGNOSIS — H35372 Puckering of macula, left eye: Secondary | ICD-10-CM | POA: Diagnosis not present

## 2019-07-05 DIAGNOSIS — H2513 Age-related nuclear cataract, bilateral: Secondary | ICD-10-CM | POA: Diagnosis not present

## 2019-07-05 DIAGNOSIS — H43813 Vitreous degeneration, bilateral: Secondary | ICD-10-CM | POA: Diagnosis not present

## 2019-07-05 DIAGNOSIS — H401131 Primary open-angle glaucoma, bilateral, mild stage: Secondary | ICD-10-CM | POA: Diagnosis not present

## 2019-08-29 DIAGNOSIS — H401111 Primary open-angle glaucoma, right eye, mild stage: Secondary | ICD-10-CM | POA: Diagnosis not present

## 2019-08-29 DIAGNOSIS — H2511 Age-related nuclear cataract, right eye: Secondary | ICD-10-CM | POA: Diagnosis not present

## 2019-09-11 ENCOUNTER — Ambulatory Visit: Payer: Medicare Other | Admitting: Certified Nurse Midwife

## 2019-09-15 ENCOUNTER — Other Ambulatory Visit: Payer: Medicare PPO

## 2019-09-26 ENCOUNTER — Encounter: Payer: Self-pay | Admitting: Internal Medicine

## 2019-09-27 DIAGNOSIS — Z961 Presence of intraocular lens: Secondary | ICD-10-CM | POA: Diagnosis not present

## 2019-10-04 NOTE — Progress Notes (Signed)
86 y.o. [id number] Married White or Caucasian Not Hispanic or Latino female here for annual exam.  Patient states that she has very heavy legs. What kind of wipe is same. And she is also wondering if it is ok to have an AEX every two years.    She has a h/o breast cancer. She has her mammogram scheduled later this month.  She c/o lower abdominal pressure, increase in abdominal bloating, some weight gain. Symptoms started a few months ago. The pressure is low level and constant. Having a BM or passing flatus doesn't help.  Normal BM qd, normal urination.   She c/o a lump in her right neck a few weeks ago, tender, some right jaw pain. Doesn't hurt to open and close her mouth. Tylenol helps.   She has a sensitive vulva, asking about soaps. No current c/o.   She is on multiple beta blockers, has heaviness in her legs, wondering the cause.  She isn't sexually active. She is on vaginal estrogen.     Patient's last menstrual period was 02/23/1985.          Sexually active: No.  The current method of family planning is abstinence.    Exercising: Yes.    Walking biking and weights  Smoker:  no  Health Maintenance: Pap:  06/24/17 WNL History of abnormal Pap:  no MMG:  09/20/18 Bi-rads 2 benign  BMD:  4/17/ 2019 Osteoporosis  Colonoscopy: 08/08/2014 polyps and moderate diverticulosis. Repeat 5 years TDaP:  05/09/14    reports that she quit smoking about 52 years ago. Her smoking use included cigarettes. She has a 10.00 pack-year smoking history. She has never used smokeless tobacco. She reports current alcohol use of about 1.0 standard drink of alcohol per week. She reports that she does not use drugs.  Past Medical History:  Diagnosis Date  . Adenomatous colon polyp   . Allergic rhinitis   . Anemia    pt states no anemia in her past hx thst she is aware of   . Atrial fibrillation ([place])    goes in and out of this rhythm- takes Tenormin  . Breast cancer, left breast ([place]) 1994 with lumpectomy    chemo,tamox,radiation  . Bronchiectasis with acute exacerbation ([place])   . COPD (chronic obstructive pulmonary disease) ([place])   . Diverticulosis of colon   . DJD (degenerative joint disease)   . Dysrhythmia    palpitations  . Family history of brain cancer   . Family history of colon cancer 02/14/2018  . Family history of colon cancer   . Family history of malignant neoplasm of gastrointestinal tract   . Family history of melanoma   . Family history of uterine cancer   . Fibromyalgia   . GERD (gastroesophageal reflux disease) 02/14/2018  . Glaucoma   . Hemoptysis   . History of pneumonia   . Hypercholesteremia    pt denies  . Hypertension   . Low back pain syndrome   . Neuromuscular disorder ([facility])    hx fibromyalgia   . Osteopenia   . Pneumonia    hx  . Pseudomonas infection   . Rotator cuff syndrome of right shoulder   . Spinal stenosis     Past Surgical History:  Procedure Laterality Date  . CATARACT EXTRACTION Right   . COLONOSCOPY    . left breast lumpectomy and LN dissection  06/1992   Dr. [name]  . LUNG BIOPSY  1968   TSurg---Dr. [name]  . LUNG BIOPSY  2012   dr nadel  . POLYPECTOMY    . REVERSE SHOULDER ARTHROPLASTY Right 08/02/2015   Procedure: RIGHT REVERSE TOTAL SHOULDER ARTHROPLASTY;  Surgeon: [name], MD;  Location: [facility];  Service: Orthopedics;  Laterality: Right;  . REVERSE TOTAL SHOULDER ARTHROPLASTY Right 08/02/2015  . TONSILLECTOMY    . TOTAL KNEE ARTHROPLASTY Right 01/21/2015   Procedure: TOTAL RIGHT KNEE ARTHROPLASTY;  Surgeon: [name], MD;  Location: [facility];  Service: Orthopedics;  Laterality: Right;  . TOTAL KNEE ARTHROPLASTY Left 03/23/2016   Procedure: LEFT TOTAL KNEE ARTHROPLASTY;  Surgeon: [name], MD;  Location: [facility];  Service: Orthopedics;  Laterality: Left;    Current Outpatient Medications  Medication Sig Dispense Refill  . amoxicillin (AMOXIL) 500 MG capsule Take prior to dental procedures    . ASPIRIN 81 PO Take by  mouth.    . Calcium Carb-Cholecalciferol 600-800 MG-UNIT TABS Take 1 tablet by mouth daily.    Marland Kitchen diltiazem (CARDIZEM CD) 120 MG 24 hr capsule TAKE 1 CAPSULE BY MOUTH EVERY DAY 90 capsule 2  . Estradiol (YUVAFEM) 10 MCG TABS vaginal tablet Place one tablet  in vagina twice weekly 24 tablet 3  . losartan (COZAAR) 50 MG tablet TAKE 1 TABLET BY MOUTH EVERY DAY 90 tablet 2  . LUMIGAN 0.01 % SOLN     . Multiple Vitamins-Minerals (MULTIVITAMIN & MINERAL PO) Take 1 tablet by mouth daily.      . Nutritional Supplements (VITAMIN D BOOSTER PO) Take by mouth.    . Respiratory Therapy Supplies (FLUTTER) DEVI 1 application by Does not apply route 3 (three) times daily. 1 each 0  . Saccharomyces boulardii (FLORASTOR PO) Take 1 capsule by mouth daily.    . Tiotropium Bromide-Olodaterol (STIOLTO RESPIMAT) 2.5-2.5 MCG/ACT AERS Inhale 2 puffs into the lungs daily. 4 g 6  . Zinc 30 MG CAPS Take 1 capsule by mouth daily.     No current facility-administered medications for this visit.    Family History  Problem Relation Age of Onset  . Heart attack Mother        Deceased age 21  . Colon cancer Father 51       deceased age 76 from colon ca.  . Hypertension Brother   . Colon polyps Daughter   . Uterine cancer Daughter 28  . Brain cancer Maternal Uncle   . Stroke Maternal Grandmother   . Other Paternal Uncle 12       possible cancer  . Melanoma Daughter 38  . Melanoma Niece 25       d. 40  . Esophageal cancer Neg Hx   . Stomach cancer Neg Hx   . Rectal cancer Neg Hx     Review of Systems  All other systems reviewed and are negative.   Exam:   BP 118/60   Pulse 90   Ht '5\' 3"'  (1.6 m)   Wt 136 lb (61.7 kg)   LMP 02/23/1985   SpO2 97%   BMI 24.09 kg/m   Weight change: '@WEIGHTCHANGE' @ Height:   Height: '5\' 3"'  (160 cm)  Ht Readings from Last 3 Encounters:  10/05/19 '5\' 3"'  (1.6 m)  03/22/19 5' 3.5" (1.613 m)  02/20/19 5' 3.5" (1.613 m)    General appearance: alert, cooperative and appears  stated age Head: Normocephalic, without obvious abnormality, atraumatic Neck: mildly tender and enlarged anterior cervical node on the left supple, symmetrical, trachea midline and thyroid normal to inspection and palpation Breasts: evidence of left lumpectomy and radiation  changes. No distinct lumps Abdomen: soft, non-tender; non distended,  no masses,  no organomegaly Extremities: extremities normal, atraumatic, no cyanosis or edema Skin: Skin color, texture, turgor normal. No rashes or lesions Lymph nodes: Cervical, supraclavicular, and axillary nodes normal. No abnormal inguinal nodes palpated Neurologic: Grossly normal   Pelvic: External genitalia:  no lesions              Urethra:  normal appearing urethra with no masses, tenderness or lesions              Bartholins and Skenes: normal                 Vagina: normal appearing vagina with normal color and discharge, no lesions              Cervix: no lesions               Bimanual Exam:  Uterus:  normal size, contour, position, consistency, mobility, non-tender              Adnexa: no mass, fullness, tenderness               Rectovaginal: Confirms               Anus:  normal sphincter tone, no lesions  Gae Dry chaperoned for the exam.  A:  GYN exam  H/O breast cancer  FH uterine cancer in her daughter with genetic mutation in MSH6, patient is negative for this gene  Pelvic pressure, bloating. No concerning findings on exam  Mildly enlarged tender lymph node right cervical region.  P:   Recommend she stop vagifem  Mammogram scheduled  No more colonoscopies  DEXA with primary  Discussed breast self exam  Discussed calcium and vit D intake  No more paps  Discussed having her return for a pelvic ultrasound, she declines at this time.   F/U with primary for jaw pain and enlarged node  In addition to the breast and pelvic exam, over 20 minutes was spent in total patient care

## 2019-10-05 ENCOUNTER — Ambulatory Visit (INDEPENDENT_AMBULATORY_CARE_PROVIDER_SITE_OTHER): Payer: Medicare PPO | Admitting: Obstetrics and Gynecology

## 2019-10-05 ENCOUNTER — Encounter: Payer: Self-pay | Admitting: Obstetrics and Gynecology

## 2019-10-05 ENCOUNTER — Other Ambulatory Visit: Payer: Self-pay

## 2019-10-05 VITALS — BP 118/60 | HR 90 | Ht 63.0 in | Wt 136.0 lb

## 2019-10-05 DIAGNOSIS — R102 Pelvic and perineal pain: Secondary | ICD-10-CM | POA: Diagnosis not present

## 2019-10-05 DIAGNOSIS — Z8049 Family history of malignant neoplasm of other genital organs: Secondary | ICD-10-CM

## 2019-10-05 DIAGNOSIS — Z800 Family history of malignant neoplasm of digestive organs: Secondary | ICD-10-CM

## 2019-10-05 DIAGNOSIS — R140 Abdominal distension (gaseous): Secondary | ICD-10-CM | POA: Diagnosis not present

## 2019-10-05 DIAGNOSIS — Z01419 Encounter for gynecological examination (general) (routine) without abnormal findings: Secondary | ICD-10-CM | POA: Diagnosis not present

## 2019-10-05 DIAGNOSIS — R599 Enlarged lymph nodes, unspecified: Secondary | ICD-10-CM

## 2019-10-05 DIAGNOSIS — Z853 Personal history of malignant neoplasm of breast: Secondary | ICD-10-CM | POA: Diagnosis not present

## 2019-10-06 NOTE — Patient Instructions (Signed)
EXERCISE AND DIET:  We recommended that you start or continue a regular exercise program for good health. Regular exercise means any activity that makes your heart beat faster and makes you sweat.  We recommend exercising at least 30 minutes per day at least 3 days a week, preferably 4 or 5.  We also recommend a diet low in fat and sugar.  Inactivity, poor dietary choices and obesity can cause diabetes, heart attack, stroke, and kidney damage, among others.    ALCOHOL AND SMOKING:  Women should limit their alcohol intake to no more than 7 drinks/beers/glasses of wine (combined, not each!) per week. Moderation of alcohol intake to this level decreases your risk of breast cancer and liver damage. And of course, no recreational drugs are part of a healthy lifestyle.  And absolutely no smoking or even second hand smoke. Most people know smoking can cause heart and lung diseases, but did you know it also contributes to weakening of your bones? Aging of your skin?  Yellowing of your teeth and nails?  CALCIUM AND VITAMIN D:  Adequate intake of calcium and Vitamin D are recommended.  The recommendations for exact amounts of these supplements seem to change often, but generally speaking 1,200 mg of calcium (between diet and supplement) and 800 units of Vitamin D per day seems prudent. Certain women may benefit from higher intake of Vitamin D.  If you are among these women, your doctor will have told you during your visit.    PAP SMEARS:  Pap smears, to check for cervical cancer or precancers,  have traditionally been done yearly, although recent scientific advances have shown that most women can have pap smears less often.  However, every woman still should have a physical exam from her gynecologist every year. It will include a breast check, inspection of the vulva and vagina to check for abnormal growths or skin changes, a visual exam of the cervix, and then an exam to evaluate the size and shape of the uterus and  ovaries.  And after 84 years of age, a rectal exam is indicated to check for rectal cancers. We will also provide age appropriate advice regarding health maintenance, like when you should have certain vaccines, screening for sexually transmitted diseases, bone density testing, colonoscopy, mammograms, etc.   MAMMOGRAMS:  All women over 40 years old should have a yearly mammogram. Many facilities now offer a "3D" mammogram, which may cost around $50 extra out of pocket. If possible,  we recommend you accept the option to have the 3D mammogram performed.  It both reduces the number of women who will be called back for extra views which then turn out to be normal, and it is better than the routine mammogram at detecting truly abnormal areas.    COLON CANCER SCREENING: Now recommend starting at age 45. At this time colonoscopy is not covered for routine screening until 50. There are take home tests that can be done between 45-49.   COLONOSCOPY:  Colonoscopy to screen for colon cancer is recommended for all women at age 50.  We know, you hate the idea of the prep.  We agree, BUT, having colon cancer and not knowing it is worse!!  Colon cancer so often starts as a polyp that can be seen and removed at colonscopy, which can quite literally save your life!  And if your first colonoscopy is normal and you have no family history of colon cancer, most women don't have to have it again for   10 years.  Once every ten years, you can do something that may end up saving your life, right?  We will be happy to help you get it scheduled when you are ready.  Be sure to check your insurance coverage so you understand how much it will cost.  It may be covered as a preventative service at no cost, but you should check your particular policy.      Breast Self-Awareness Breast self-awareness means being familiar with how your breasts look and feel. It involves checking your breasts regularly and reporting any changes to your  health care provider. Practicing breast self-awareness is important. A change in your breasts can be a sign of a serious medical problem. Being familiar with how your breasts look and feel allows you to find any problems early, when treatment is more likely to be successful. All women should practice breast self-awareness, including women who have had breast implants. How to do a breast self-exam One way to learn what is normal for your breasts and whether your breasts are changing is to do a breast self-exam. To do a breast self-exam: Look for Changes  1. Remove all the clothing above your waist. 2. Stand in front of a mirror in a room with good lighting. 3. Put your hands on your hips. 4. Push your hands firmly downward. 5. Compare your breasts in the mirror. Look for differences between them (asymmetry), such as: ? Differences in shape. ? Differences in size. ? Puckers, dips, and bumps in one breast and not the other. 6. Look at each breast for changes in your skin, such as: ? Redness. ? Scaly areas. 7. Look for changes in your nipples, such as: ? Discharge. ? Bleeding. ? Dimpling. ? Redness. ? A change in position. Feel for Changes Carefully feel your breasts for lumps and changes. It is best to do this while lying on your back on the floor and again while sitting or standing in the shower or tub with soapy water on your skin. Feel each breast in the following way:  Place the arm on the side of the breast you are examining above your head.  Feel your breast with the other hand.  Start in the nipple area and make  inch (2 cm) overlapping circles to feel your breast. Use the pads of your three middle fingers to do this. Apply light pressure, then medium pressure, then firm pressure. The light pressure will allow you to feel the tissue closest to the skin. The medium pressure will allow you to feel the tissue that is a little deeper. The firm pressure will allow you to feel the tissue  close to the ribs.  Continue the overlapping circles, moving downward over the breast until you feel your ribs below your breast.  Move one finger-width toward the center of the body. Continue to use the  inch (2 cm) overlapping circles to feel your breast as you move slowly up toward your collarbone.  Continue the up and down exam using all three pressures until you reach your armpit.  Write Down What You Find  Write down what is normal for each breast and any changes that you find. Keep a written record with breast changes or normal findings for each breast. By writing this information down, you do not need to depend only on memory for size, tenderness, or location. Write down where you are in your menstrual cycle, if you are still menstruating. If you are having trouble noticing differences   in your breasts, do not get discouraged. With time you will become more familiar with the variations in your breasts and more comfortable with the exam. How often should I examine my breasts? Examine your breasts every month. If you are breastfeeding, the best time to examine your breasts is after a feeding or after using a breast pump. If you menstruate, the best time to examine your breasts is 5-7 days after your period is over. During your period, your breasts are lumpier, and it may be more difficult to notice changes. When should I see my health care provider? See your health care provider if you notice:  A change in shape or size of your breasts or nipples.  A change in the skin of your breast or nipples, such as a reddened or scaly area.  Unusual discharge from your nipples.  A lump or thick area that was not there before.  Pain in your breasts.  Anything that concerns you.  

## 2019-10-10 DIAGNOSIS — Z1231 Encounter for screening mammogram for malignant neoplasm of breast: Secondary | ICD-10-CM | POA: Diagnosis not present

## 2019-12-07 ENCOUNTER — Other Ambulatory Visit: Payer: Self-pay | Admitting: Internal Medicine

## 2019-12-07 NOTE — Telephone Encounter (Signed)
Please refill as per office routine med refill policy (all routine meds refilled for 3 mo or monthly per pt preference up to one year from last visit, then month to month grace period for 3 mo, then further med refills will have to be denied)  

## 2019-12-11 ENCOUNTER — Other Ambulatory Visit: Payer: Self-pay | Admitting: Internal Medicine

## 2019-12-11 NOTE — Telephone Encounter (Signed)
Please refill as per office routine med refill policy (all routine meds refilled for 3 mo or monthly per pt preference up to one year from last visit, then month to month grace period for 3 mo, then further med refills will have to be denied)  

## 2019-12-14 ENCOUNTER — Other Ambulatory Visit: Payer: Self-pay

## 2019-12-14 ENCOUNTER — Ambulatory Visit (INDEPENDENT_AMBULATORY_CARE_PROVIDER_SITE_OTHER)
Admission: RE | Admit: 2019-12-14 | Discharge: 2019-12-14 | Disposition: A | Discharge status: Home / Self Care | Payer: Medicare PPO | Source: Ambulatory Visit | Attending: Pulmonary Disease | Admitting: Pulmonary Disease

## 2019-12-14 DIAGNOSIS — R911 Solitary pulmonary nodule: Secondary | ICD-10-CM | POA: Diagnosis not present

## 2019-12-14 DIAGNOSIS — J479 Bronchiectasis, uncomplicated: Secondary | ICD-10-CM | POA: Diagnosis not present

## 2019-12-14 DIAGNOSIS — I281 Aneurysm of pulmonary artery: Secondary | ICD-10-CM | POA: Diagnosis not present

## 2019-12-14 DIAGNOSIS — I7789 Other specified disorders of arteries and arterioles: Secondary | ICD-10-CM | POA: Diagnosis not present

## 2019-12-14 DIAGNOSIS — I2510 Atherosclerotic heart disease of native coronary artery without angina pectoris: Secondary | ICD-10-CM | POA: Diagnosis not present

## 2019-12-25 ENCOUNTER — Encounter: Payer: Self-pay | Admitting: Pulmonary Disease

## 2019-12-25 ENCOUNTER — Other Ambulatory Visit: Payer: Self-pay

## 2019-12-25 ENCOUNTER — Ambulatory Visit (INDEPENDENT_AMBULATORY_CARE_PROVIDER_SITE_OTHER): Payer: Medicare PPO | Admitting: Pulmonary Disease

## 2019-12-25 VITALS — BP 130/64 | HR 85 | Ht 63.5 in | Wt 133.8 lb

## 2019-12-25 DIAGNOSIS — R093 Abnormal sputum: Secondary | ICD-10-CM | POA: Diagnosis not present

## 2019-12-25 DIAGNOSIS — R29898 Other symptoms and signs involving the musculoskeletal system: Secondary | ICD-10-CM | POA: Diagnosis not present

## 2019-12-25 DIAGNOSIS — Z853 Personal history of malignant neoplasm of breast: Secondary | ICD-10-CM | POA: Diagnosis not present

## 2019-12-25 DIAGNOSIS — J479 Bronchiectasis, uncomplicated: Secondary | ICD-10-CM

## 2019-12-25 DIAGNOSIS — R059 Cough, unspecified: Secondary | ICD-10-CM

## 2019-12-25 DIAGNOSIS — R918 Other nonspecific abnormal finding of lung field: Secondary | ICD-10-CM

## 2019-12-25 NOTE — Progress Notes (Signed)
Synopsis: Referred in July 2020 for est care from Dr. [name], PCP: [name], MD  Subjective:   PATIENT ID: Miranda Thompson GENDER: female DOB: March 13, 1933, MRN: [id number]  Chief Complaint  Patient presents with  . Follow-up    Patient states that her breathing is much better since the [facility] and feels like her legs are heavy    Former patient of Dr. [name].  Followed for COPD and bronchiectasis.  Here today to establish care.  She is currently managed with a flutter valve and individual postural drainage at home.  She exercises regularly.  She rides a stationary Peloton bicycle 6 times per week.  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  She believes that her first lung injury occurred when she was washing her husband's clothes many years ago in the basement when she decided to mix Clorox bleach and ammonium.  She was a horrible gas fumes that came out of the basement washing machine that drove her out of the home.  She states her flutter valve works some of the times but does not work as well as postural drainage.  As she is getting older she has become weaker and unable to do these at home.  She currently lives alone is unable to do CPT or have someone to help her with CPT.  She has not had axial CT imaging of the chest since 2004.  She did have breast cancer in the early 90s status post radiation to the left side following a lumpectomy.  She has daily cough and sputum production.  No recent fevers.  OV 03/22/2019: Patient seen today for follow-up in the office for her COPD and bronchiectasis.  Currently symptoms are at baseline.  She does have daily sputum production.  This is better with postural drainage daily.  Currently using her inhaler regimen and.  She is on a ICS/LABA.  Patient has several questions today.  Including Covid vaccine questions.  All of these were addressed during the office visit.  Patient has received her first Covid  vaccine.  OV 12/25/2019: Here today for follow-up after recent noncontrasted CT scan of the chest.  It does reveal bilateral areas of diffuse nodularity and persistent verrucoid and cystic bronchiectasis.  No other significant changes.  She does notice there are readings that report coronary disease.  She has questions about this today.  We reviewed her CT scan imaging in detail with patient and patient's daughter present at bedside.  She also complains today of heaviness.  And her aches lower extremities.  This predominantly occurs with exertion or with walking.  She notices a heavy sensation in her lower extremities this is new over the past couple of months and has been insidious with onset.   Past Medical History:  Diagnosis Date  . Adenomatous colon polyp   . Allergic rhinitis   . Anemia    pt states no anemia in her past hx thst she is aware of   . Atrial fibrillation ([place])    goes in and out of this rhythm- takes Tenormin  . Breast cancer, left breast ([place]) 1994 with lumpectomy   chemo,tamox,radiation  . Bronchiectasis with acute exacerbation ([place])   . COPD (chronic obstructive pulmonary disease) ([place])   . Diverticulosis of colon   . DJD (degenerative joint disease)   . Dysrhythmia    palpitations  . Family history of brain cancer   . Family history of colon  cancer 02/14/2018  . Family history of colon cancer   . Family history of malignant neoplasm of gastrointestinal tract   . Family history of melanoma   . Family history of uterine cancer   . Fibromyalgia   . GERD (gastroesophageal reflux disease) 02/14/2018  . Glaucoma   . Hemoptysis   . History of pneumonia   . Hypercholesteremia    pt denies  . Hypertension   . Low back pain syndrome   . Neuromuscular disorder (H[facility])    hx fibromyalgia   . Osteopenia   . Pneumonia    hx  . Pseudomonas infection   . Rotator cuff syndrome of right shoulder   . Spinal stenosis      Family History  Problem Relation Age of Onset    . Heart attack Mother        Deceased age 61  . Colon cancer Father 62       deceased age 15 from colon ca.  . Hypertension Brother   . Colon polyps Daughter   . Uterine cancer Daughter 81  . Brain cancer Maternal Uncle   . Stroke Maternal Grandmother   . Other Paternal Uncle 12       possible cancer  . Melanoma Daughter 40  . Melanoma Niece 25       d. 43  . Esophageal cancer Neg Hx   . Stomach cancer Neg Hx   . Rectal cancer Neg Hx      Past Surgical History:  Procedure Laterality Date  . CATARACT EXTRACTION Right   . COLONOSCOPY    . left breast lumpectomy and LN dissection  06/1992   Dr. [name]  . LUNG BIOPSY  1968   TSurg---Dr. [name]  . LUNG BIOPSY  2012   dr [name]  . POLYPECTOMY    . REVERSE SHOULDER ARTHROPLASTY Right 08/02/2015   Procedure: RIGHT REVERSE TOTAL SHOULDER ARTHROPLASTY;  Surgeon: [name], MD;  Location: [facility];  Service: Orthopedics;  Laterality: Right;  . REVERSE TOTAL SHOULDER ARTHROPLASTY Right 08/02/2015  . TONSILLECTOMY    . TOTAL KNEE ARTHROPLASTY Right 01/21/2015   Procedure: TOTAL RIGHT KNEE ARTHROPLASTY;  Surgeon: [name], MD;  Location: [facility];  Service: Orthopedics;  Laterality: Right;  . TOTAL KNEE ARTHROPLASTY Left 03/23/2016   Procedure: LEFT TOTAL KNEE ARTHROPLASTY;  Surgeon: [name], MD;  Location: [facility];  Service: Orthopedics;  Laterality: Left;    Social History   Socioeconomic History  . Marital status: Married    Spouse name: Not on file  . Number of children: 5  . Years of education: Not on file  . Highest education level: Not on file  Occupational History  . Occupation: Retired  Tobacco Use  . Smoking status: Former Smoker    Packs/day: 0.25    Years: 40.00    Pack years: 10.00    Types: Cigarettes    Quit date: 02/24/1967    Years since quitting: 52.8  . Smokeless tobacco: Never Used  Vaping Use  . Vaping Use: Never used  Substance and Sexual Activity  . Alcohol use: Yes    Alcohol/week: 1.0  standard drink    Types: 1 Standard drinks or equivalent per week    Comment: occasionally  . Drug use: No  . Sexual activity: Not Currently    Partners: Male    Comment: husband vasectomy  Other Topics Concern  . Not on file  Social History Narrative  . Not on file   Social Determinants  of Health   Financial Resource Strain:   . Difficulty of Paying Living Expenses: Not on file  Food Insecurity:   . Worried About Charity fundraiser in the Last Year: Not on file  . Ran Out of Food in the Last Year: Not on file  Transportation Needs:   . Lack of Transportation (Medical): Not on file  . Lack of Transportation (Non-Medical): Not on file  Physical Activity:   . Days of Exercise per Week: Not on file  . Minutes of Exercise per Session: Not on file  Stress:   . Feeling of Stress : Not on file  Social Connections:   . Frequency of Communication with Friends and Family: Not on file  . Frequency of Social Gatherings with Friends and Family: Not on file  . Attends Religious Services: Not on file  . Active Member of Clubs or Organizations: Not on file  . Attends Archivist Meetings: Not on file  . Marital Status: Not on file  Intimate Partner Violence:   . Fear of Current or Ex-Partner: Not on file  . Emotionally Abused: Not on file  . Physically Abused: Not on file  . Sexually Abused: Not on file     No Known Allergies   Outpatient Medications Prior to Visit  Medication Sig Dispense Refill  . amoxicillin (AMOXIL) 500 MG capsule Take prior to dental procedures    . ASPIRIN 81 PO Take by mouth.    . Calcium Carb-Cholecalciferol 600-800 MG-UNIT TABS Take 1 tablet by mouth daily.    Marland Kitchen diltiazem (CARDIZEM CD) 120 MG 24 hr capsule TAKE 1 CAPSULE BY MOUTH EVERY DAY 90 capsule 2  . losartan (COZAAR) 50 MG tablet TAKE 1 TABLET BY MOUTH EVERY DAY 90 tablet 2  . LUMIGAN 0.01 % SOLN     . Multiple Vitamins-Minerals (MULTIVITAMIN & MINERAL PO) Take 1 tablet by mouth daily.        . Nutritional Supplements (VITAMIN D BOOSTER PO) Take by mouth.    . Respiratory Therapy Supplies (FLUTTER) DEVI 1 application by Does not apply route 3 (three) times daily. 1 each 0  . Saccharomyces boulardii (FLORASTOR PO) Take 1 capsule by mouth daily.    . Tiotropium Bromide-Olodaterol (STIOLTO RESPIMAT) 2.5-2.5 MCG/ACT AERS Inhale 2 puffs into the lungs daily. 4 g 6  . Estradiol (YUVAFEM) 10 MCG TABS vaginal tablet Place one tablet  in vagina twice weekly 24 tablet 3  . Zinc 30 MG CAPS Take 1 capsule by mouth daily.     No facility-administered medications prior to visit.    Review of Systems  Constitutional: Negative for chills, fever, malaise/fatigue and weight loss.  HENT: Negative for hearing loss, sore throat and tinnitus.   Eyes: Negative for blurred vision and double vision.  Respiratory: Positive for cough and sputum production. Negative for hemoptysis, shortness of breath, wheezing and stridor.   Cardiovascular: Positive for claudication. Negative for chest pain, palpitations, orthopnea, leg swelling and PND.  Gastrointestinal: Negative for abdominal pain, constipation, diarrhea, heartburn, nausea and vomiting.  Genitourinary: Negative for dysuria, hematuria and urgency.  Musculoskeletal: Negative for joint pain and myalgias.  Skin: Negative for itching and rash.  Neurological: Negative for dizziness, tingling, weakness and headaches.  Endo/Heme/Allergies: Negative for environmental allergies. Does not bruise/bleed easily.  Psychiatric/Behavioral: Negative for depression. The patient is not nervous/anxious and does not have insomnia.   All other systems reviewed and are negative.    Objective:  Physical Exam Vitals reviewed.  Constitutional:  General: She is not in acute distress.    Appearance: She is well-developed.  HENT:     Head: Normocephalic and atraumatic.  Eyes:     General: No scleral icterus.    Conjunctiva/sclera: Conjunctivae normal.     Pupils:  Pupils are equal, round, and reactive to light.  Neck:     Vascular: No JVD.     Trachea: No tracheal deviation.  Cardiovascular:     Rate and Rhythm: Normal rate and regular rhythm.     Heart sounds: Normal heart sounds. No murmur heard.   Pulmonary:     Effort: Pulmonary effort is normal. No tachypnea, accessory muscle usage or respiratory distress.     Breath sounds: No stridor. Rhonchi present. No wheezing or rales.     Comments: Bilateral bronchial breath sounds Abdominal:     General: Bowel sounds are normal. There is no distension.     Palpations: Abdomen is soft.     Tenderness: There is no abdominal tenderness.  Musculoskeletal:        General: No tenderness.     Cervical back: Neck supple.  Lymphadenopathy:     Cervical: No cervical adenopathy.  Skin:    General: Skin is warm and dry.     Capillary Refill: Capillary refill takes less than 2 seconds.     Findings: No rash.  Neurological:     Mental Status: She is alert and oriented to person, place, and time.  Psychiatric:        Behavior: Behavior normal.      Vitals:   12/25/19 1602  BP: 130/64  Pulse: 85  SpO2: 97%  Weight: 133 lb 12.8 oz (60.7 kg)  Height: 5' 3.5" (1.613 m)   97% on RA BMI Readings from Last 3 Encounters:  12/25/19 23.33 kg/m  10/05/19 24.09 kg/m  06/09/19 23.36 kg/m   Wt Readings from Last 3 Encounters:  12/25/19 133 lb 12.8 oz (60.7 kg)  10/05/19 136 lb (61.7 kg)  06/09/19 134 lb (60.8 kg)    Chest Imaging:  CT Chest in 2004: IMPRESSION 1.  MULTIPLE AREAS OF BRONCHIECTATIC CHANGE WITH ASSOCIATED DISTAL ALVEOLAR INFILTRATES SUGGESTING POSTOBSTRUCTIVE PNEUMONITIS AND/OR ATELECTASIS. SEVERAL OF THE DILATED BRONCHI SHOW INTRALUMINAL MATERIAL AND SUGGEST THE PRESENCE OF MUCOUS PLUGS. 2.  AREA OF FOCAL PLEURAL-BASED DENSITY IN THE RIGHT LOWER WHICH DOES NOT APPEAR TO BE RELATED TO ASSOCIATED BRONCHIECTASIS AND IS MORE WORRISOME FOR AN AREA OF FOCAL NEOPLASTIC CHANGE OR  POSSIBLY A FOCUS OF ROUNDED PNEUMONIA.   AN AREA OF FOCAL INFARCTION WOULD BE A THIRD CONSIDERATION IN THE APPROPRIATE CLINICAL SETTING.  SHORT-TERM FOLLOW-UP WITH CT CAN BE UNDERTAKEN TO ASSESS FOR SOME DEGREE OF INTERVAL RESOLUTION IN TWO TO THREE WEEKS' TIME AS MIGHT BE EXPECTED WITH EITHER A PNEUMONITIS OR A RESOLVING AREA OF INFARCTION.  IF NO INTERVAL RESOLUTION IS APPRECIATED OVER THIS PERIOD OF TIME, THEN A NEOPLASTIC PROCESS WOULD BE A MORE WORRISOME CONSIDERATION AND TISSUE SAMPLING WOULD BE RECOMMENDED AT THAT POINT.  2018 chest x-ray: Chronic interstitial changes within the bilateral bases. The patient's images have been independently reviewed by me.    12/16/2018 CT chest: Areas of cystic bronchiectasis within the lower lobes, dilated bronchi with mucus plugging.  Patient also has few scattered small lung nodules.  Enlarged pulmonary arteries concerning for pulmonary hypertension. The patient's images have been independently reviewed by me.    October 2021 CT chest: Significant areas of cystic and varicoid bronchiectasis dilated airways as compared to previous imaging  nodule stable. The patient's images have been independently reviewed by me.    Pulmonary Functions Testing Results: No flowsheet data found.   FeNO: None   Pathology: None   Echocardiogram:   09/2018: IMPRESSIONS    1. The left ventricle has normal systolic function with an ejection fraction of 60-65%. The cavity size was normal. Left ventricular diastolic Doppler parameters are consistent with pseudonormalization.  2. The right ventricle has normal systolic function. The cavity was normal. There is no increase in right ventricular wall thickness.  3. No evidence of mitral valve stenosis.  4. No stenosis of the aortic valve.  5. The aorta is normal in size and structure.  6. The aortic root and ascending aorta are normal in size and structure.  7. Grossly normal.  Heart Catheterization: None      Assessment & Plan:     ICD-10-CM   1. Leg heaviness  R29.898 VAS [place] ABI WITH/WO TBI  2. Bronchiectasis without complication ([place])  Q59.5   3. History of left breast cancer  Z85.3   4. Increased sputum production  R09.3   5. Multiple pulmonary nodules  R91.8   6. Cough  R05.9     Assessment:   This is an 86 year old female with a longstanding history of bronchiectasis.  Currently managed with postural drainage, flutter valve and airway clearance techniques.  She is currently on a bronchodilator regimen of Stiolto and as needed albuterol.  Otherwise has been doing very well from a respiratory standpoint.  Here today for follow-up imaging review.  This demonstrates persistent chronic bronchiectatic changes as described above.  New complaints today of lower extremity heaviness with exertion.  Plan: Due to the lower extremity issues I have ordered an ankle-brachial index. I recommend follow-up with PCP regarding this. As for her bronchiectasis she needs to continue current conservative management to include airway clearance techniques flutter valve. Next step up for her would be use of vest therapy if needed. Continue bronchodilator to include LABA/LAMA. Would like to avoid ICS in the setting of bronchiectasis.  Patient to follow-up with [place] in 1 year or as needed.  We will call with results of ABI.  I spent 30 minutes dedicated to the care of this patient on the date of this encounter to include pre-visit review of records, face-to-face time with the patient discussing conditions above, post visit ordering of testing, clinical documentation with the electronic health record, making appropriate referrals as documented, and communicating necessary findings to members of the patients care team.     Garner Nash, [facility] 12/25/2019 4:12 PM

## 2019-12-25 NOTE — Patient Instructions (Signed)
Thank you for visiting Dr. Valeta Harms at [facility]. Today we recommend the following:  Orders Placed This Encounter  Procedures  . VAS [place] ABI WITH/WO TBI   Continue to exercise, drain, flutter valve.  Continue stiolto   Return in about 1 year (around 12/24/2020) for with APP or Dr. Valeta Harms.    Please do your part to reduce the spread of COVID-19.

## 2020-01-02 ENCOUNTER — Other Ambulatory Visit: Payer: Self-pay | Admitting: Internal Medicine

## 2020-01-10 ENCOUNTER — Other Ambulatory Visit: Payer: Self-pay

## 2020-01-10 ENCOUNTER — Ambulatory Visit (HOSPITAL_COMMUNITY)
Admission: RE | Admit: 2020-01-10 | Discharge: 2020-01-10 | Disposition: A | Discharge status: Home / Self Care | Payer: Medicare PPO | Source: Ambulatory Visit | Attending: Cardiology | Admitting: Cardiology

## 2020-01-10 DIAGNOSIS — R29898 Other symptoms and signs involving the musculoskeletal system: Secondary | ICD-10-CM | POA: Insufficient documentation

## 2020-01-24 NOTE — Telephone Encounter (Signed)
Dr Valeta Harms, please advise on pt email, thanks!   *-*-*This message has not been handled.*-*-*  Hi Dr. Valeta Harms,       I was pleased to learn that the my evaluation for PAD was normal, what do you think the next step should be, the  feeling of heaviness has not gotten better and may be a bit more severe on some days.  I still walk about  1  1/2 miles at least four times /week and ride the stationery bike the other days.  The bike is easier, but, probably not as beneficial.  Please advise.      Hope that you and yours had a very nice Thanksgiving. Thanks , Miranda Thompson  Oct 30, 1933

## 2020-02-01 NOTE — Progress Notes (Signed)
Cardiology Office Note   Date:  02/02/2020   ID:  Miranda, Thompson Mar 20, 1933, MRN [id number]  PCP:  [name], MD  Cardiologist:  [name], MD   Chief Complaint  Patient presents with   Follow-up    History of Present Illness: Miranda Thompson is a 86 y.o. female who presents for the evaluation of leg heaviness.   [name] was initially seen by Dr. Johnsie Thompson for the evaluation of palpitations in 2015 at which time these were felt to mostly be atrial arrhythmias due to lung disease and bronchiectasis which she is followed by Dr. Valeta Harms. She was started on atenolol however eventually complained of fatigue and was transitioned to diltiazem.  She wore a Holter monitor which showed PACs/PVCs.  She underwent an echocardiogram 09/27/2018 with LVEF at 60 to 65% with no valvular disease and no pulmonary hypertension.  Chest CT 09/14/2018 with some progression of ILD/bronchiectasis  She was last seen by Dr. Johnsie Thompson 11/23/2018 in follow-up and seems to be doing fairly well from a CV standpoint.  No changes were made.  One pulmonary follow up with Dr. Valeta Harms 12/25/2019 she had complaints of her legs feeling very "heavy".  Plan was to obtain ankle-brachial index with PCP follow-up. Lower extremity vascular study completed 01/10/2020 which showed normal ABIs.   She continues to walk approximately 1.5 miles per day 4 days/week and would ride the stationary bike on other days. She is here with her daughter for the evaluation of leg heaviness as described above.  She has new lower extremity leg pain and reports that with walking her legs feel that the way 20 pounds more than previous.  She has no edema associated with this.  Given normal Doppler studies, we discussed symptoms could be related to spine/nerve compression osteoporosis or degenerative disease.  Chest CT performed per pulmonary shows moderate thoracic spondylosis.  We discussed these findings.  I have recommended that she follow-up with  her PCP possible for further spine/Ortho work-up.  Also discussed increasing weightbearing activities.   Past Medical History:  Diagnosis Date   Adenomatous colon polyp    Allergic rhinitis    Anemia    pt states no anemia in her past hx thst she is aware of    Atrial fibrillation (Miranda Thompson)    goes in and out of this rhythm- takes Tenormin   Breast cancer, left breast (Miranda Thompson) 1994 with lumpectomy   chemo,tamox,radiation   Bronchiectasis with acute exacerbation (HCC)    COPD (chronic obstructive pulmonary disease) (HCC)    Diverticulosis of colon    DJD (degenerative joint disease)    Dysrhythmia    palpitations   Family history of brain cancer    Family history of colon cancer 02/14/2018   Family history of colon cancer    Family history of malignant neoplasm of gastrointestinal tract    Family history of melanoma    Family history of uterine cancer    Fibromyalgia    GERD (gastroesophageal reflux disease) 02/14/2018   Glaucoma    Hemoptysis    History of pneumonia    Hypercholesteremia    pt denies   Hypertension    Low back pain syndrome    Neuromuscular disorder (HCC)    hx fibromyalgia    Osteopenia    Pneumonia    hx   Pseudomonas infection    Rotator cuff syndrome of right shoulder    Spinal stenosis     Past Surgical History:  Procedure Laterality Date   CATARACT EXTRACTION Right    COLONOSCOPY     left breast lumpectomy and LN dissection  06/1992   Dr. Lucia Thompson   LUNG BIOPSY  1968   TSurg---Dr. Mare Thompson   LUNG BIOPSY  2012   dr nadel   POLYPECTOMY     REVERSE SHOULDER ARTHROPLASTY Right 08/02/2015   Procedure: RIGHT REVERSE TOTAL SHOULDER ARTHROPLASTY;  Surgeon: [name], MD;  Location: [facility];  Service: Orthopedics;  Laterality: Right;   REVERSE TOTAL SHOULDER ARTHROPLASTY Right 08/02/2015   TONSILLECTOMY     TOTAL KNEE ARTHROPLASTY Right 01/21/2015   Procedure: TOTAL RIGHT KNEE ARTHROPLASTY;  Surgeon: Miranda Cancel,  MD;  Location: [facility];  Service: Orthopedics;  Laterality: Right;   TOTAL KNEE ARTHROPLASTY Left 03/23/2016   Procedure: LEFT TOTAL KNEE ARTHROPLASTY;  Surgeon: Miranda Cancel, MD;  Location: [facility];  Service: Orthopedics;  Laterality: Left;     Current Outpatient Medications  Medication Sig Dispense Refill   amoxicillin (AMOXIL) 500 MG capsule Take prior to dental procedures     ASPIRIN 81 PO Take by mouth.     Calcium Carb-Cholecalciferol 600-800 MG-UNIT TABS Take 1 tablet by mouth daily.     diltiazem (CARDIZEM CD) 120 MG 24 hr capsule TAKE 1 CAPSULE BY MOUTH EVERY DAY 90 capsule 2   losartan (COZAAR) 50 MG tablet TAKE 1 TABLET BY MOUTH EVERY DAY 90 tablet 2   LUMIGAN 0.01 % SOLN      Multiple Vitamins-Minerals (MULTIVITAMIN & MINERAL PO) Take 1 tablet by mouth daily.     Nutritional Supplements (VITAMIN D BOOSTER PO) Take by mouth.     Respiratory Therapy Supplies (FLUTTER) DEVI 1 application by Does not apply route 3 (three) times daily. 1 each 0   Saccharomyces boulardii (FLORASTOR PO) Take 1 capsule by mouth daily.     Tiotropium Bromide-Olodaterol (STIOLTO RESPIMAT) 2.5-2.5 MCG/ACT AERS Inhale 2 puffs into the lungs daily. 4 g 6   No current facility-administered medications for this visit.    Allergies:   Patient has no known allergies.    Social History:  The patient  reports that she quit smoking about 52 years ago. Her smoking use included cigarettes. She has a 10.00 pack-year smoking history. She has never used smokeless tobacco. She reports current alcohol use of about 1.0 standard drink of alcohol per week. She reports that she does not use drugs.   Family History:  The patient's family history includes Brain cancer in her maternal uncle; Colon cancer (age of onset: 42) in her father; Colon polyps in her daughter; Heart attack in her mother; Hypertension in her brother; Melanoma (age of onset: 78) in her niece; Melanoma (age of onset: 43) in her daughter; Other  (age of onset: 47) in her paternal uncle; Stroke in her maternal grandmother; Uterine cancer (age of onset: 72) in her daughter.    ROS:  Please see the history of present illness.   Otherwise, review of systems are positive for none.   All other systems are reviewed and negative.    PHYSICAL EXAM: VS:  BP 120/64    Pulse 88    Ht 5' 3.5" (1.613 m)    Wt 135 lb 12.8 oz (61.6 kg)    LMP 02/23/1985    SpO2 96%    BMI 23.68 kg/m  , BMI Body mass index is 23.68 kg/m.   General: Well developed, well nourished, NAD Neck: Negative for carotid bruits. No JVD Lungs: Clear to ausculation bilaterally.  No wheezes, rales, or rhonchi. Breathing is unlabored. Cardiovascular: RRR with S1 S2. No murmurs MSK: Strength and tone appear normal for age. 5/5 in all extremities Extremities: No edema. DP/PT pulses 2+ bilaterally Neuro: Alert and oriented. No focal deficits. No facial asymmetry. MAE spontaneously. Psych: Responds to questions appropriately with normal affect.     EKG:  EKG is ordered today. The ekg ordered today demonstrates NSR, HR 88bpm   Recent Labs: 02/15/2019: ALT 14; BUN 15; Creatinine, Ser 0.64; Hemoglobin 12.1; Platelets 243.0; Potassium 4.0; Sodium 130; TSH 3.32    Lipid Panel    Component Value Date/Time   CHOL 173 02/15/2019 1459   TRIG 83.0 02/15/2019 1459   HDL 62.70 02/15/2019 1459   CHOLHDL 3 02/15/2019 1459   VLDL 16.6 02/15/2019 1459   LDLCALC 93 02/15/2019 1459     Wt Readings from Last 3 Encounters:  02/02/20 135 lb 12.8 oz (61.6 kg)  12/25/19 133 lb 12.8 oz (60.7 kg)  10/05/19 136 lb (61.7 kg)    Other studies Reviewed: Additional studies/ records that were reviewed today include:  Review of the above records demonstrates:   Lower extremity doppler studies 01/10/20:  Right: Resting right ankle-brachial index is within normal range. No  evidence of significant right lower extremity arterial disease. The right  toe-brachial index is normal.   Left:  Resting left ankle-brachial index is within normal range. No  evidence of significant left lower extremity arterial disease. The left  toe-brachial index is normal.   ASSESSMENT AND PLAN:  1.  Arrhythmia: -Felt to be secondary to ILD previously treated with atenolol and currently on diltiazem with no recurrent palpitations or arrhythmias per patient report.  EKG stable today with NSR -Continue current regimen  2.  Aortic atherosclerosis: -Per chest CT -Denies anginal symptoms -Continue ASA -Follows with PCP next week for full wellness check, lab work  3.  HTN: -Stable, 120/64 -Continue current regimen  4.  Bronchiectasis: -Followed by Dr. Valeta Harms with pulmonary medicine -Last seen 12/25/2019  5.  Leg heaviness: -On last OV with Dr. Valeta Harms, reported leg heaviness with walking therefore lower extremity Doppler study with ABI performed which were normal.  We discussed findings on recent chest CT on 12/17/2019 with moderate thoracic spondylosis.  Symptoms could be related to degenerative disc disease/thoracic spondylosis therefore recommended that she follow with her PCP for possible further imaging/Ortho work-up.  Could benefit from increasing weightbearing exercises for lower extremity strength.   Current medicines are reviewed at length with the patient today.  The patient does not have concerns regarding medicines.  The following changes have been made:  no change  Labs/ tests ordered today include: None   Orders Placed This Encounter  Procedures   EKG 12-Lead     Disposition:   FU with myself in 6 months  Signed, Kathyrn Drown, NP  02/02/2020 11:28 AM    [facility] [address], [place], [place]  [address] Phone: [phone]; Fax: [phone]

## 2020-02-02 ENCOUNTER — Encounter: Payer: Self-pay | Admitting: Cardiology

## 2020-02-02 ENCOUNTER — Ambulatory Visit (INDEPENDENT_AMBULATORY_CARE_PROVIDER_SITE_OTHER): Payer: Medicare PPO | Admitting: Cardiology

## 2020-02-02 ENCOUNTER — Encounter: Payer: Self-pay | Admitting: Internal Medicine

## 2020-02-02 ENCOUNTER — Other Ambulatory Visit: Payer: Self-pay

## 2020-02-02 VITALS — BP 120/64 | HR 88 | Ht 63.5 in | Wt 135.8 lb

## 2020-02-02 DIAGNOSIS — R29898 Other symptoms and signs involving the musculoskeletal system: Secondary | ICD-10-CM | POA: Diagnosis not present

## 2020-02-02 DIAGNOSIS — I10 Essential (primary) hypertension: Secondary | ICD-10-CM

## 2020-02-02 DIAGNOSIS — I491 Atrial premature depolarization: Secondary | ICD-10-CM

## 2020-02-02 DIAGNOSIS — I498 Other specified cardiac arrhythmias: Secondary | ICD-10-CM | POA: Diagnosis not present

## 2020-02-02 DIAGNOSIS — I700 Atherosclerosis of aorta: Secondary | ICD-10-CM | POA: Diagnosis not present

## 2020-02-02 NOTE — Patient Instructions (Signed)
Medication Instructions:  Your physician recommends that you continue on your current medications as directed. Please refer to the Current Medication list given to you today.  *If you need a refill on your cardiac medications before your next appointment, please call your pharmacy*   Lab Work: NONE If you have labs (blood work) drawn today and your tests are completely normal, you will receive your results only by: Marland Kitchen MyChart Message (if you have MyChart) OR . A paper copy in the mail If you have any lab test that is abnormal or we need to change your treatment, we will call you to review the results.   Testing/Procedures: NONE   Follow-Up: At [facility], you and your health needs are our priority.  As part of our continuing mission to provide you with exceptional heart care, we have created designated Provider Care Teams.  These Care Teams include your primary Cardiologist (physician) and Advanced Practice Providers (APPs -  Physician Assistants and Nurse Practitioners) who all work together to provide you with the care you need, when you need it.  We recommend signing up for the patient portal called "MyChart".  Sign up information is provided on this After Visit Summary.  MyChart is used to connect with patients for Virtual Visits (Telemedicine).  Patients are able to view lab/test results, encounter notes, upcoming appointments, etc.  Non-urgent messages can be sent to your provider as well.   To learn more about what you can do with MyChart, go to [email or web address].    Your next appointment:   6 month(s)  The format for your next appointment:   In Person  Provider:   Kathyrn Drown, NP

## 2020-02-14 ENCOUNTER — Ambulatory Visit: Payer: Medicare PPO | Admitting: Internal Medicine

## 2020-02-15 ENCOUNTER — Other Ambulatory Visit: Payer: Medicare Other

## 2020-02-21 ENCOUNTER — Ambulatory Visit: Payer: Medicare Other | Admitting: Internal Medicine

## 2020-03-05 NOTE — Telephone Encounter (Signed)
Dr. Valeta Harms, Please see patient comment.  Thank you.

## 2020-03-08 ENCOUNTER — Other Ambulatory Visit (INDEPENDENT_AMBULATORY_CARE_PROVIDER_SITE_OTHER): Payer: Medicare PPO

## 2020-03-08 DIAGNOSIS — Z0000 Encounter for general adult medical examination without abnormal findings: Secondary | ICD-10-CM | POA: Diagnosis not present

## 2020-03-08 DIAGNOSIS — R739 Hyperglycemia, unspecified: Secondary | ICD-10-CM

## 2020-03-08 DIAGNOSIS — E559 Vitamin D deficiency, unspecified: Secondary | ICD-10-CM

## 2020-03-08 LAB — CBC WITH DIFFERENTIAL/PLATELET
Basophils Absolute: 0.1 10*3/uL (ref 0.0–0.1)
Basophils Relative: 2.2 % (ref 0.0–3.0)
Eosinophils Absolute: 0.3 10*3/uL (ref 0.0–0.7)
Eosinophils Relative: 6.1 % — ABNORMAL HIGH (ref 0.0–5.0)
HCT: 36.2 % (ref 36.0–46.0)
Hemoglobin: 12.6 g/dL (ref 12.0–15.0)
Lymphocytes Relative: 24.5 % (ref 12.0–46.0)
Lymphs Abs: 1 10*3/uL (ref 0.7–4.0)
MCHC: 34.8 g/dL (ref 30.0–36.0)
MCV: 96.2 fl (ref 78.0–100.0)
Monocytes Absolute: 0.5 10*3/uL (ref 0.1–1.0)
Monocytes Relative: 10.6 % (ref 3.0–12.0)
Neutro Abs: 2.4 10*3/uL (ref 1.4–7.7)
Neutrophils Relative %: 56.6 % (ref 43.0–77.0)
Platelets: 244 10*3/uL (ref 150.0–400.0)
RBC: 3.77 Mil/uL — ABNORMAL LOW (ref 3.87–5.11)
RDW: 12.4 % (ref 11.5–15.5)
WBC: 4.3 10*3/uL (ref 4.0–10.5)

## 2020-03-08 LAB — URINALYSIS, ROUTINE W REFLEX MICROSCOPIC
Bilirubin Urine: NEGATIVE
Ketones, ur: NEGATIVE
Nitrite: POSITIVE — AB
Specific Gravity, Urine: 1.015 (ref 1.000–1.030)
Total Protein, Urine: NEGATIVE
Urine Glucose: NEGATIVE
Urobilinogen, UA: 0.2 (ref 0.0–1.0)
pH: 6.5 (ref 5.0–8.0)

## 2020-03-08 LAB — BASIC METABOLIC PANEL
BUN: 16 mg/dL (ref 6–23)
CO2: 27 mEq/L (ref 19–32)
Calcium: 9.1 mg/dL (ref 8.4–10.5)
Chloride: 96 mEq/L (ref 96–112)
Creatinine, Ser: 0.64 mg/dL (ref 0.40–1.20)
GFR: 80.93 mL/min (ref >60.00)
Glucose, Bld: 84 mg/dL (ref 70–99)
Potassium: 4.4 mEq/L (ref 3.5–5.1)
Sodium: 129 mEq/L — ABNORMAL LOW (ref 135–145)

## 2020-03-08 LAB — LIPID PANEL
Cholesterol: 161 mg/dL (ref 0–200)
HDL: 66.3 mg/dL (ref >39.00)
LDL Cholesterol: 85 mg/dL (ref 0–99)
NonHDL: 94.24
Total CHOL/HDL Ratio: 2
Triglycerides: 45 mg/dL (ref 0.0–149.0)
VLDL: 9 mg/dL (ref 0.0–40.0)

## 2020-03-08 LAB — TSH: TSH: 4.06 u[IU]/mL (ref 0.35–4.50)

## 2020-03-08 LAB — HEPATIC FUNCTION PANEL
ALT: 15 U/L (ref 0–35)
AST: 24 U/L (ref 0–37)
Albumin: 4 g/dL (ref 3.5–5.2)
Alkaline Phosphatase: 59 U/L (ref 39–117)
Bilirubin, Direct: 0.2 mg/dL (ref 0.0–0.3)
Total Bilirubin: 0.7 mg/dL (ref 0.2–1.2)
Total Protein: 7.2 g/dL (ref 6.0–8.3)

## 2020-03-08 LAB — VITAMIN D 25 HYDROXY (VIT D DEFICIENCY, FRACTURES): VITD: 54.75 ng/mL (ref 30.00–100.00)

## 2020-03-08 LAB — HEMOGLOBIN A1C: Hgb A1c MFr Bld: 5.9 % (ref 4.6–6.5)

## 2020-03-12 ENCOUNTER — Ambulatory Visit: Payer: Medicare PPO | Admitting: Physician Assistant

## 2020-03-13 ENCOUNTER — Other Ambulatory Visit: Payer: Self-pay

## 2020-03-14 ENCOUNTER — Encounter: Payer: Self-pay | Admitting: Internal Medicine

## 2020-03-14 ENCOUNTER — Ambulatory Visit (INDEPENDENT_AMBULATORY_CARE_PROVIDER_SITE_OTHER): Payer: Medicare PPO | Admitting: Internal Medicine

## 2020-03-14 VITALS — BP 150/70 | HR 91 | Temp 97.9°F | Ht 63.5 in | Wt 134.0 lb

## 2020-03-14 DIAGNOSIS — G8929 Other chronic pain: Secondary | ICD-10-CM

## 2020-03-14 DIAGNOSIS — E871 Hypo-osmolality and hyponatremia: Secondary | ICD-10-CM | POA: Diagnosis not present

## 2020-03-14 DIAGNOSIS — K118 Other diseases of salivary glands: Secondary | ICD-10-CM | POA: Diagnosis not present

## 2020-03-14 DIAGNOSIS — M5441 Lumbago with sciatica, right side: Secondary | ICD-10-CM | POA: Diagnosis not present

## 2020-03-14 DIAGNOSIS — Z0001 Encounter for general adult medical examination with abnormal findings: Secondary | ICD-10-CM | POA: Diagnosis not present

## 2020-03-14 DIAGNOSIS — R739 Hyperglycemia, unspecified: Secondary | ICD-10-CM

## 2020-03-14 DIAGNOSIS — I10 Essential (primary) hypertension: Secondary | ICD-10-CM | POA: Diagnosis not present

## 2020-03-14 DIAGNOSIS — M5442 Lumbago with sciatica, left side: Secondary | ICD-10-CM

## 2020-03-14 NOTE — Patient Instructions (Signed)
Please continue all other medications as before, and refills have been done if requested.  Please have the pharmacy call with any other refills you may need.  Please continue your efforts at being more active, low cholesterol diet, and weight control.  You are otherwise up to date with prevention measures today.  Please keep your appointments with your specialists as you may have planned  You will be contacted regarding the referral for: MRI  Please make an Appointment to return in 6 months, or sooner if needed

## 2020-03-14 NOTE — Progress Notes (Addendum)
Established Patient Office Visit  Subjective:  Patient ID: Miranda Thompson, female    DOB: 08/13/1933  Age: 87 y.o. MRN: [id number]        Chief Complaint:: wellness exam and Annual Exam       HPI:  Miranda Thompson is a 87 y.o. female here for wellness exam  Immunization History  Administered Date(s) Administered  . Fluad Quad(high Dose 65+) 11/08/2019  . Influenza Split 12/03/2011  . Influenza Whole 11/30/2007, 11/30/2008, 12/03/2009, 11/24/2010  . Influenza, High Dose Seasonal PF 11/19/2015, 12/02/2016, 12/17/2017, 11/22/2018  . Influenza,inj,Quad PF,6+ Mos 11/10/2012, 11/08/2013, 11/06/2014  . Influenza-Unspecified 12/02/2016, 11/22/2018  . PFIZER(Purple Top)SARS-COV-2 Vaccination 03/14/2019, 04/04/2019, 11/18/2019  . Pneumococcal Conjugate-13 05/09/2014  . Pneumococcal Polysaccharide-23 11/07/2007  . Tdap 05/09/2014  . Zoster 12/11/2009  . Zoster Recombinat (Shingrix) 04/10/2018, 01/08/2019   There are no preventive care reminders to display for this patient.  Wt Readings from Last 3 Encounters:  03/14/20 134 lb (60.8 kg)  02/02/20 135 lb 12.8 oz (61.6 kg)  12/25/19 133 lb 12.8 oz (60.7 kg)   BP Readings from Last 3 Encounters:  03/14/20 (!) 150/70  02/02/20 120/64  12/25/19 130/64        Also c/o acute problem of:  1) > 6 mo wax and wane right submandibular tender small mass to right submental/submandibular area, without fever, oral symptoms, overlying skin change or drainage.  Also 2)  > 39mo persistent lbp, occas mod to severe better and worse, but nothing seems to make it better or worse, and no bowel or bladder change, wt loss,  worsening LE pain/numbness/weakness, gait change or falls.  She believe the stiolto tends to make her leg feel " heavy" but this seems to be a different issue.   Pt denies polydipsia, polyuria  Past Medical History:  Diagnosis Date  . Adenomatous colon polyp   . Allergic rhinitis   . Anemia    pt states no anemia in her past hx thst she  is aware of   . Atrial fibrillation ([place])    goes in and out of this rhythm- takes Tenormin  . Breast cancer, left breast ([place]) 1994 with lumpectomy   chemo,tamox,radiation  . Bronchiectasis with acute exacerbation ([place])   . COPD (chronic obstructive pulmonary disease) ([place])   . Diverticulosis of colon   . DJD (degenerative joint disease)   . Dysrhythmia    palpitations  . Family history of brain cancer   . Family history of colon cancer 02/14/2018  . Family history of colon cancer   . Family history of malignant neoplasm of gastrointestinal tract   . Family history of melanoma   . Family history of uterine cancer   . Fibromyalgia   . GERD (gastroesophageal reflux disease) 02/14/2018  . Glaucoma   . Hemoptysis   . History of pneumonia   . Hypercholesteremia    pt denies  . Hypertension   . Low back pain syndrome   . Neuromuscular disorder (HCC)    hx fibromyalgia   . Osteopenia   . Pneumonia    hx  . Pseudomonas infection   . Rotator cuff syndrome of right shoulder   . Spinal stenosis    Past Surgical History:  Procedure Laterality Date  . CATARACT EXTRACTION Right   . COLONOSCOPY    . left breast lumpectomy and LN dissection  06/1992   Dr. [name]  . LUNG BIOPSY  1968   TSurg---Dr. [name]  . LUNG BIOPSY  2012  dr [name]  . POLYPECTOMY    . REVERSE SHOULDER ARTHROPLASTY Right 08/02/2015   Procedure: RIGHT REVERSE TOTAL SHOULDER ARTHROPLASTY;  Surgeon: [name], MD;  Location: [facility];  Service: Orthopedics;  Laterality: Right;  . REVERSE TOTAL SHOULDER ARTHROPLASTY Right 08/02/2015  . TONSILLECTOMY    . TOTAL KNEE ARTHROPLASTY Right 01/21/2015   Procedure: TOTAL RIGHT KNEE ARTHROPLASTY;  Surgeon: [name], MD;  Location: [facility];  Service: Orthopedics;  Laterality: Right;  . TOTAL KNEE ARTHROPLASTY Left 03/23/2016   Procedure: LEFT TOTAL KNEE ARTHROPLASTY;  Surgeon: [name], MD;  Location: [facility];  Service: Orthopedics;  Laterality: Left;    reports that  she quit smoking about 53 years ago. Her smoking use included cigarettes. She has a 10.00 pack-year smoking history. She has never used smokeless tobacco. She reports current alcohol use of about 1.0 standard drink of alcohol per week. She reports that she does not use drugs. family history includes Brain cancer in her maternal uncle; Colon cancer (age of onset: 66) in her father; Colon polyps in her daughter; Heart attack in her mother; Hypertension in her brother; Melanoma (age of onset: 8) in her niece; Melanoma (age of onset: 47) in her daughter; Other (age of onset: 73) in her paternal uncle; Stroke in her maternal grandmother; Uterine cancer (age of onset: 38) in her daughter. No Known Allergies Current Outpatient Medications on File Prior to Visit  Medication Sig Dispense Refill  . amoxicillin (AMOXIL) 500 MG capsule Take prior to dental procedures    . ASPIRIN 81 PO Take by mouth.    . Calcium Carb-Cholecalciferol 600-800 MG-UNIT TABS Take 1 tablet by mouth daily.    Marland Kitchen diltiazem (CARDIZEM CD) 120 MG 24 hr capsule TAKE 1 CAPSULE BY MOUTH EVERY DAY 90 capsule 2  . losartan (COZAAR) 50 MG tablet TAKE 1 TABLET BY MOUTH EVERY DAY 90 tablet 2  . LUMIGAN 0.01 % SOLN     . Multiple Vitamins-Minerals (MULTIVITAMIN & MINERAL PO) Take 1 tablet by mouth daily.    . Nutritional Supplements (VITAMIN D BOOSTER PO) Take by mouth.    . Respiratory Therapy Supplies (FLUTTER) DEVI 1 application by Does not apply route 3 (three) times daily. 1 each 0  . Saccharomyces boulardii (FLORASTOR PO) Take 1 capsule by mouth daily.    . Tiotropium Bromide-Olodaterol (STIOLTO RESPIMAT) 2.5-2.5 MCG/ACT AERS Inhale 2 puffs into the lungs daily. 4 g 6   No current facility-administered medications on file prior to visit.        ROS:  All others reviewed and negative.  Objective        PE:  BP (!) 150/70   Pulse 91   Temp 97.9 F (36.6 C) (Oral)   Ht 5' 3.5" (1.613 m)   Wt 134 lb (60.8 kg)   LMP 02/23/1985    SpO2 96%   BMI 23.36 kg/m                 Constitutional: Pt appears in NAD               HENT: Head: NCAT.                Right Ear: External ear normal.                 Left Ear: External ear normal.                Eyes: . Pupils are equal, round, and reactive to light. Conjunctivae  and EOM are normal               Nose: without d/c or deformity               Neck: Neck supple. Gross normal ROM, right anterior submandibular neck area with<1/2 cm mild tender firm mobile subq mass               Cardiovascular: Normal rate and regular rhythm.                 Pulmonary/Chest: Effort normal and breath sounds without rales or wheezing.                Abd:  Soft, NT, ND, + BS, no organomegaly               Neurological: Pt is alert. At baseline orientation, motor grossly intact               Skin: Skin is warm. No rashes, no other new lesions, LE edema - none               Psychiatric: Pt behavior is normal without agitation   Assessment/Plan:  Miranda Thompson is a 87 y.o. White or Caucasian [1] female with  has a past medical history of Adenomatous colon polyp, Allergic rhinitis, Anemia, Atrial fibrillation ([place]), Breast cancer, left breast ([place]) (1994 with lumpectomy), Bronchiectasis with acute exacerbation ([place]), COPD (chronic obstructive pulmonary disease) ([place]), Diverticulosis of colon, DJD (degenerative joint disease), Dysrhythmia, Family history of brain cancer, Family history of colon cancer (02/14/2018), Family history of colon cancer, Family history of malignant neoplasm of gastrointestinal tract, Family history of melanoma, Family history of uterine cancer, Fibromyalgia, GERD (gastroesophageal reflux disease) (02/14/2018), Glaucoma, Hemoptysis, History of pneumonia, Hypercholesteremia, Hypertension, Low back pain syndrome, Neuromuscular disorder ([place]), Osteopenia, Pneumonia, Pseudomonas infection, Rotator cuff syndrome of right shoulder, and Spinal stenosis.  Assessment Plan  See notes  Labs/data reviewed for each problem:  Micro: none  Cardiac tracings I have personally interpreted today:  none  Pertinent Radiological findings (summarize): none   There are no preventive care reminders to display for this patient.  There are no preventive care reminders to display for this patient.  Lab Results  Component Value Date   TSH 4.06 03/08/2020   Lab Results  Component Value Date   WBC 4.3 03/08/2020   HGB 12.6 03/08/2020   HCT 36.2 03/08/2020   MCV 96.2 03/08/2020   PLT 244.0 03/08/2020   Lab Results  Component Value Date   NA 129 (L) 03/08/2020   K 4.4 03/08/2020   CO2 27 03/08/2020   GLUCOSE 84 03/08/2020   BUN 16 03/08/2020   CREATININE 0.64 03/08/2020   BILITOT 0.7 03/08/2020   ALKPHOS 59 03/08/2020   AST 24 03/08/2020   ALT 15 03/08/2020   PROT 7.2 03/08/2020   ALBUMIN 4.0 03/08/2020   CALCIUM 9.1 03/08/2020   ANIONGAP 9 10/11/2016   GFR 80.93 03/08/2020   Lab Results  Component Value Date   CHOL 161 03/08/2020   Lab Results  Component Value Date   HDL 66.30 03/08/2020   Lab Results  Component Value Date   LDLCALC 85 03/08/2020   Lab Results  Component Value Date   TRIG 45.0 03/08/2020   Lab Results  Component Value Date   CHOLHDL 2 03/08/2020   Lab Results  Component Value Date   HGBA1C 5.9 03/08/2020      Assessment & Plan:   Problem List  Items Addressed This Visit      High   Encounter for well adult exam with abnormal findings - Primary    Age and sex appropriate education and counseling updated with regular exercise and diet Referrals for preventative services - none Immunizations addressed - none Smoking counseling  - none needed Evidence for depression or other mood disorder - none Most recent labs reviewed. I have personally reviewed and have noted: 1) the patient's medical and social history 2) The patient's current medications and supplements 3) The patient's height, weight, and BMI have been recorded in the  chart          Medium   Submandibular gland mass    Right submandbilar, likely salivary gland or lymph node related, no surrounding LA, wax and wane in size,  Mild tender - not fixed and hard, have low suspicion for malignancy, d/w opt - will hold on further imaging, and pt declines ENT referral for now      Hyponatremia    Chronic stable, cont to monitor with next labs      Hyperglycemia    Lab Results  Component Value Date   HGBA1C 5.9 03/08/2020   Stable, pt to continue current medical treatment  - diet      Episode of hypertension    BP Readings from Last 3 Encounters:  03/14/20 (!) 150/70  02/02/20 120/64  12/25/19 130/64   Stable, pt to continue medical treatment  - losartan, cardizem   Current Outpatient Medications (Cardiovascular):  .  diltiazem (CARDIZEM CD) 120 MG 24 hr capsule, TAKE 1 CAPSULE BY MOUTH EVERY DAY .  losartan (COZAAR) 50 MG tablet, TAKE 1 TABLET BY MOUTH EVERY DAY  Current Outpatient Medications (Respiratory):  Marland Kitchen  Tiotropium Bromide-Olodaterol (STIOLTO RESPIMAT) 2.5-2.5 MCG/ACT AERS, Inhale 2 puffs into the lungs daily.  Current Outpatient Medications (Analgesics):  Marland Kitchen  ASPIRIN 81 PO, Take by mouth.   Current Outpatient Medications (Other):  .  amoxicillin (AMOXIL) 500 MG capsule, Take prior to dental procedures .  Calcium Carb-Cholecalciferol 600-800 MG-UNIT TABS, Take 1 tablet by mouth daily. Marland Kitchen  LUMIGAN 0.01 % SOLN,  .  Multiple Vitamins-Minerals (MULTIVITAMIN & MINERAL PO), Take 1 tablet by mouth daily. .  Nutritional Supplements (VITAMIN D BOOSTER PO), Take by mouth. .  Respiratory Therapy Supplies (FLUTTER) DEVI, 1 application by Does not apply route 3 (three) times daily. .  Saccharomyces boulardii (FLORASTOR PO), Take 1 capsule by mouth daily.         Low   LOW BACK PAIN SYNDROME    Recent worsening, chronic persistent, no recent imaging or surgical eval, needs further definitive evaluation - for MRI, consider surgical  referral      Relevant Orders   MR Lumbar Spine Wo Contrast      No orders of the defined types were placed in this encounter.   Follow-up: Return in about 6 months (around 09/11/2020).   Cathlean Cower, MD 03/20/2020 8:26 PM [facility]

## 2020-03-19 DIAGNOSIS — H2512 Age-related nuclear cataract, left eye: Secondary | ICD-10-CM | POA: Diagnosis not present

## 2020-03-19 DIAGNOSIS — Z961 Presence of intraocular lens: Secondary | ICD-10-CM | POA: Diagnosis not present

## 2020-03-19 DIAGNOSIS — H43813 Vitreous degeneration, bilateral: Secondary | ICD-10-CM | POA: Diagnosis not present

## 2020-03-19 DIAGNOSIS — H401131 Primary open-angle glaucoma, bilateral, mild stage: Secondary | ICD-10-CM | POA: Diagnosis not present

## 2020-03-19 DIAGNOSIS — H35372 Puckering of macula, left eye: Secondary | ICD-10-CM | POA: Diagnosis not present

## 2020-03-19 DIAGNOSIS — H04123 Dry eye syndrome of bilateral lacrimal glands: Secondary | ICD-10-CM | POA: Diagnosis not present

## 2020-03-20 ENCOUNTER — Encounter: Payer: Self-pay | Admitting: Internal Medicine

## 2020-03-20 DIAGNOSIS — E871 Hypo-osmolality and hyponatremia: Secondary | ICD-10-CM | POA: Insufficient documentation

## 2020-03-20 DIAGNOSIS — K118 Other diseases of salivary glands: Secondary | ICD-10-CM | POA: Insufficient documentation

## 2020-03-20 NOTE — Assessment & Plan Note (Signed)
Chronic stable, cont to monitor with next labs

## 2020-03-20 NOTE — Assessment & Plan Note (Signed)
Right submandbilar, likely salivary gland or lymph node related, no surrounding LA, wax and wane in size,  Mild tender - not fixed and hard, have low suspicion for malignancy, d/w opt - will hold on further imaging, and pt declines ENT referral for now

## 2020-03-20 NOTE — Assessment & Plan Note (Signed)
BP Readings from Last 3 Encounters:  03/14/20 (!) 150/70  02/02/20 120/64  12/25/19 130/64   Stable, pt to continue medical treatment  - losartan, cardizem   Current Outpatient Medications (Cardiovascular):  .  diltiazem (CARDIZEM CD) 120 MG 24 hr capsule, TAKE 1 CAPSULE BY MOUTH EVERY DAY .  losartan (COZAAR) 50 MG tablet, TAKE 1 TABLET BY MOUTH EVERY DAY  Current Outpatient Medications (Respiratory):  Marland Kitchen  Tiotropium Bromide-Olodaterol (STIOLTO RESPIMAT) 2.5-2.5 MCG/ACT AERS, Inhale 2 puffs into the lungs daily.  Current Outpatient Medications (Analgesics):  Marland Kitchen  ASPIRIN 81 PO, Take by mouth.   Current Outpatient Medications (Other):  .  amoxicillin (AMOXIL) 500 MG capsule, Take prior to dental procedures .  Calcium Carb-Cholecalciferol 600-800 MG-UNIT TABS, Take 1 tablet by mouth daily. Marland Kitchen  LUMIGAN 0.01 % SOLN,  .  Multiple Vitamins-Minerals (MULTIVITAMIN & MINERAL PO), Take 1 tablet by mouth daily. .  Nutritional Supplements (VITAMIN D BOOSTER PO), Take by mouth. .  Respiratory Therapy Supplies (FLUTTER) DEVI, 1 application by Does not apply route 3 (three) times daily. .  Saccharomyces boulardii (FLORASTOR PO), Take 1 capsule by mouth daily.

## 2020-03-20 NOTE — Assessment & Plan Note (Addendum)
Recent worsening, chronic persistent, no recent imaging or surgical eval, needs further definitive evaluation - for MRI, consider surgical referral

## 2020-03-20 NOTE — Assessment & Plan Note (Signed)
Lab Results  Component Value Date   HGBA1C 5.9 03/08/2020   Stable, pt to continue current medical treatment  - diet  

## 2020-03-20 NOTE — Assessment & Plan Note (Signed)
Age and sex appropriate education and counseling updated with regular exercise and diet Referrals for preventative services - none Immunizations addressed - none Smoking counseling  - none needed Evidence for depression or other mood disorder - none Most recent labs reviewed. I have personally reviewed and have noted: 1) the patient's medical and social history 2) The patient's current medications and supplements 3) The patient's height, weight, and BMI have been recorded in the chart

## 2020-03-21 ENCOUNTER — Ambulatory Visit (INDEPENDENT_AMBULATORY_CARE_PROVIDER_SITE_OTHER): Payer: Medicare PPO

## 2020-03-21 ENCOUNTER — Ambulatory Visit: Payer: Medicare PPO | Admitting: Cardiovascular Disease

## 2020-03-21 DIAGNOSIS — Z0000 Encounter for general adult medical examination without abnormal findings: Secondary | ICD-10-CM

## 2020-03-21 NOTE — Patient Instructions (Signed)
Miranda Thompson , Thank you for taking time to come for your Medicare Wellness Visit. I appreciate your ongoing commitment to your health goals. Please review the following plan we discussed and let me know if I can assist you in the future.   Screening recommendations/referrals: Colonoscopy: 08/08/2014; due every 5 years Mammogram: 11/01/2019; due every year Bone Density: 06/09/2017; due every 2 years Recommended yearly ophthalmology/optometry visit for glaucoma screening and checkup Recommended yearly dental visit for hygiene and checkup  Vaccinations: Influenza vaccine: 11/08/2019 Pneumococcal vaccine: up to date, but need booster of Pneumovax 23 at your next office visit Tdap vaccine: 05/09/2014; due every 10 years Shingles vaccine: up to date   Covid-19: up to date  Advanced directives: Please bring a copy of your health care power of attorney and living will to the office at your convenience.  Conditions/risks identified: Yes; Reviewed health maintenance screenings with patient today and relevant education, vaccines, and/or referrals were provided. Please continue to do your personal lifestyle choices by: daily care of teeth and gums, regular physical activity (goal should be 5 days a week for 30 minutes), eat a healthy diet, avoid tobacco and drug use, limiting any alcohol intake, taking a low-dose aspirin (if not allergic or have been advised by your provider otherwise) and taking vitamins and minerals as recommended by your provider. Continue doing brain stimulating activities (puzzles, reading, adult coloring books, staying active) to keep memory sharp. Continue to eat heart healthy diet (full of fruits, vegetables, whole grains, lean protein, water--limit salt, fat, and sugar intake) and increase physical activity as tolerated.  Next appointment: Please schedule your next Medicare Wellness Visit with your Nurse Health Advisor in 1 year by calling [phone].  Preventive Care 83 Years and  Older, Female Preventive care refers to lifestyle choices and visits with your health care provider that can promote health and wellness. What does preventive care include?  A yearly physical exam. This is also called an annual well check.  Dental exams once or twice a year.  Routine eye exams. Ask your health care provider how often you should have your eyes checked.  Personal lifestyle choices, including:  Daily care of your teeth and gums.  Regular physical activity.  Eating a healthy diet.  Avoiding tobacco and drug use.  Limiting alcohol use.  Practicing safe sex.  Taking low-dose aspirin every day.  Taking vitamin and mineral supplements as recommended by your health care provider. What happens during an annual well check? The services and screenings done by your health care provider during your annual well check will depend on your age, overall health, lifestyle risk factors, and family history of disease. Counseling  Your health care provider may ask you questions about your:  Alcohol use.  Tobacco use.  Drug use.  Emotional well-being.  Home and relationship well-being.  Sexual activity.  Eating habits.  History of falls.  Memory and ability to understand (cognition).  Work and work Statistician.  Reproductive health. Screening  You may have the following tests or measurements:  Height, weight, and BMI.  Blood pressure.  Lipid and cholesterol levels. These may be checked every 5 years, or more frequently if you are over 56 years old.  Skin check.  Lung cancer screening. You may have this screening every year starting at age 43 if you have a 30-pack-year history of smoking and currently smoke or have quit within the past 15 years.  Fecal occult blood test (FOBT) of the stool. You may have this test  every year starting at age 24.  Flexible sigmoidoscopy or colonoscopy. You may have a sigmoidoscopy every 5 years or a colonoscopy every 10 years  starting at age 37.  Hepatitis C blood test.  Hepatitis B blood test.  Sexually transmitted disease (STD) testing.  Diabetes screening. This is done by checking your blood sugar (glucose) after you have not eaten for a while (fasting). You may have this done every 1-3 years.  Bone density scan. This is done to screen for osteoporosis. You may have this done starting at age 92.  Mammogram. This may be done every 1-2 years. Talk to your health care provider about how often you should have regular mammograms. Talk with your health care provider about your test results, treatment options, and if necessary, the need for more tests. Vaccines  Your health care provider may recommend certain vaccines, such as:  Influenza vaccine. This is recommended every year.  Tetanus, diphtheria, and acellular pertussis (Tdap, Td) vaccine. You may need a Td booster every 10 years.  Zoster vaccine. You may need this after age 33.  Pneumococcal 13-valent conjugate (PCV13) vaccine. One dose is recommended after age 27.  Pneumococcal polysaccharide (PPSV23) vaccine. One dose is recommended after age 67. Talk to your health care provider about which screenings and vaccines you need and how often you need them. This information is not intended to replace advice given to you by your health care provider. Make sure you discuss any questions you have with your health care provider. Document Released: 03/08/2015 Document Revised: 10/30/2015 Document Reviewed: 12/11/2014 Elsevier Interactive Patient Education  2017 [place] Prevention in the Home Falls can cause injuries. They can happen to people of all ages. There are many things you can do to make your home safe and to help prevent falls. What can I do on the outside of my home?  Regularly fix the edges of walkways and driveways and fix any cracks.  Remove anything that might make you trip as you walk through a door, such as a raised step or  threshold.  Trim any bushes or trees on the path to your home.  Use bright outdoor lighting.  Clear any walking paths of anything that might make someone trip, such as rocks or tools.  Regularly check to see if handrails are loose or broken. Make sure that both sides of any steps have handrails.  Any raised decks and porches should have guardrails on the edges.  Have any leaves, snow, or ice cleared regularly.  Use sand or salt on walking paths during winter.  Clean up any spills in your garage right away. This includes oil or grease spills. What can I do in the bathroom?  Use night lights.  Install grab bars by the toilet and in the tub and shower. Do not use towel bars as grab bars.  Use non-skid mats or decals in the tub or shower.  If you need to sit down in the shower, use a plastic, non-slip stool.  Keep the floor dry. Clean up any water that spills on the floor as soon as it happens.  Remove soap buildup in the tub or shower regularly.  Attach bath mats securely with double-sided non-slip rug tape.  Do not have throw rugs and other things on the floor that can make you trip. What can I do in the bedroom?  Use night lights.  Make sure that you have a light by your bed that is easy to reach.  Do not use any sheets or blankets that are too big for your bed. They should not hang down onto the floor.  Have a firm chair that has side arms. You can use this for support while you get dressed.  Do not have throw rugs and other things on the floor that can make you trip. What can I do in the kitchen?  Clean up any spills right away.  Avoid walking on wet floors.  Keep items that you use a lot in easy-to-reach places.  If you need to reach something above you, use a strong step stool that has a grab bar.  Keep electrical cords out of the way.  Do not use floor polish or wax that makes floors slippery. If you must use wax, use non-skid floor wax.  Do not have  throw rugs and other things on the floor that can make you trip. What can I do with my stairs?  Do not leave any items on the stairs.  Make sure that there are handrails on both sides of the stairs and use them. Fix handrails that are broken or loose. Make sure that handrails are as long as the stairways.  Check any carpeting to make sure that it is firmly attached to the stairs. Fix any carpet that is loose or worn.  Avoid having throw rugs at the top or bottom of the stairs. If you do have throw rugs, attach them to the floor with carpet tape.  Make sure that you have a light switch at the top of the stairs and the bottom of the stairs. If you do not have them, ask someone to add them for you. What else can I do to help prevent falls?  Wear shoes that:  Do not have high heels.  Have rubber bottoms.  Are comfortable and fit you well.  Are closed at the toe. Do not wear sandals.  If you use a stepladder:  Make sure that it is fully opened. Do not climb a closed stepladder.  Make sure that both sides of the stepladder are locked into place.  Ask someone to hold it for you, if possible.  Clearly mark and make sure that you can see:  Any grab bars or handrails.  First and last steps.  Where the edge of each step is.  Use tools that help you move around (mobility aids) if they are needed. These include:  Canes.  Walkers.  Scooters.  Crutches.  Turn on the lights when you go into a dark area. Replace any light bulbs as soon as they burn out.  Set up your furniture so you have a clear path. Avoid moving your furniture around.  If any of your floors are uneven, fix them.  If there are any pets around you, be aware of where they are.  Review your medicines with your doctor. Some medicines can make you feel dizzy. This can increase your chance of falling. Ask your doctor what other things that you can do to help prevent falls. This information is not intended to  replace advice given to you by your health care provider. Make sure you discuss any questions you have with your health care provider. Document Released: 12/06/2008 Document Revised: 07/18/2015 Document Reviewed: 03/16/2014 Elsevier Interactive Patient Education  2017 [employer].

## 2020-03-21 NOTE — Progress Notes (Signed)
I connected with Miranda Thompson today by telephone and verified that I am speaking with the correct person using two identifiers. Location patient: home Location provider: work Persons participating in the virtual visit: Miranda Thompson and Miranda Cassette, LPN.   I discussed the limitations, risks, security and privacy concerns of performing an evaluation and management service by telephone and the availability of in person appointments. I also discussed with the patient that there Thompson be a patient responsible charge related to this service. The patient expressed understanding and verbally consented to this telephonic visit.    Interactive audio and video telecommunications were attempted between this provider and patient, however failed, due to patient having technical difficulties OR patient did not have access to video capability.  We continued and completed visit with audio only.  Some vital signs Thompson be absent or patient reported.   Time Spent with patient on telephone encounter: 30 minutes Subjective:   Miranda Thompson is a 87 y.o. female who presents for Medicare Annual (Subsequent) preventive examination.  Review of Systems    No ROS. Medicare Wellness Virtual Visit. Additional risk factors are reflected in social history. Cardiac Risk Factors include: advanced age (>46men, >29 women);dyslipidemia;family history of premature cardiovascular disease;hypertension     Objective:    There were no vitals filed for this visit. There is no height or weight on file to calculate BMI.  Advanced Directives 03/21/2020 10/27/2016 10/11/2016 03/24/2016 03/23/2016 03/23/2016 03/18/2016  Does Patient Have a Medical Advance Directive? Yes Yes Yes Yes - Yes Yes  Type of Advance Directive Living will;Healthcare Power of [employer] Power of [place];Living will Healthcare Power of [place];Living will Healthcare Power of [place];Living will Healthcare Power of [place];Living will Healthcare  Power of [place];Living will Healthcare Power of [place];Living will  Does patient want to make changes to medical advance directive? No - Patient declined - - No - Patient declined - - -  Copy of Healthcare Power of Attorney in Chart? No - copy requested - - - No - copy requested No - copy requested No - copy requested    Current Medications (verified) Outpatient Encounter Medications as of 03/21/2020  Medication Sig  . amoxicillin (AMOXIL) 500 MG capsule Take prior to dental procedures  . ASPIRIN 81 PO Take by mouth.  . Calcium Carb-Cholecalciferol 600-800 MG-UNIT TABS Take 1 tablet by mouth daily.  Marland Kitchen diltiazem (CARDIZEM CD) 120 MG 24 hr capsule TAKE 1 CAPSULE BY MOUTH EVERY DAY  . losartan (COZAAR) 50 MG tablet TAKE 1 TABLET BY MOUTH EVERY DAY  . LUMIGAN 0.01 % SOLN   . Multiple Vitamins-Minerals (MULTIVITAMIN & MINERAL PO) Take 1 tablet by mouth daily.  . Nutritional Supplements (VITAMIN D BOOSTER PO) Take by mouth.  . Respiratory Therapy Supplies (FLUTTER) DEVI 1 application by Does not apply route 3 (three) times daily.  . Saccharomyces boulardii (FLORASTOR PO) Take 1 capsule by mouth daily.  . Tiotropium Bromide-Olodaterol (STIOLTO RESPIMAT) 2.5-2.5 MCG/ACT AERS Inhale 2 puffs into the lungs daily.   No facility-administered encounter medications on file as of 03/21/2020.    Allergies (verified) Patient has no known allergies.   History: Past Medical History:  Diagnosis Date  . Adenomatous colon polyp   . Allergic rhinitis   . Anemia    pt states no anemia in her past hx thst she is aware of   . Atrial fibrillation (HCC)    goes in and out of this rhythm- takes Tenormin  . Breast cancer, left breast (HCC) 1994  with lumpectomy   chemo,tamox,radiation  . Bronchiectasis with acute exacerbation ([place])   . COPD (chronic obstructive pulmonary disease) ([place])   . Diverticulosis of colon   . DJD (degenerative joint disease)   . Dysrhythmia    palpitations  . Family history of  brain cancer   . Family history of colon cancer 02/14/2018  . Family history of colon cancer   . Family history of malignant neoplasm of gastrointestinal tract   . Family history of melanoma   . Family history of uterine cancer   . Fibromyalgia   . GERD (gastroesophageal reflux disease) 02/14/2018  . Glaucoma   . Hemoptysis   . History of pneumonia   . Hypercholesteremia    pt denies  . Hypertension   . Low back pain syndrome   . Neuromuscular disorder (HCC)    hx fibromyalgia   . Osteopenia   . Pneumonia    hx  . Pseudomonas infection   . Rotator cuff syndrome of right shoulder   . Spinal stenosis    Past Surgical History:  Procedure Laterality Date  . CATARACT EXTRACTION Right   . COLONOSCOPY    . left breast lumpectomy and LN dissection  06/1992   Dr. [name]  . LUNG BIOPSY  1968   TSurg---Dr. [name]  . LUNG BIOPSY  2012   dr [name]  . POLYPECTOMY    . REVERSE SHOULDER ARTHROPLASTY Right 08/02/2015   Procedure: RIGHT REVERSE TOTAL SHOULDER ARTHROPLASTY;  Surgeon: [name], MD;  Location: [facility];  Service: Orthopedics;  Laterality: Right;  . REVERSE TOTAL SHOULDER ARTHROPLASTY Right 08/02/2015  . TONSILLECTOMY    . TOTAL KNEE ARTHROPLASTY Right 01/21/2015   Procedure: TOTAL RIGHT KNEE ARTHROPLASTY;  Surgeon: [name], MD;  Location: [facility];  Service: Orthopedics;  Laterality: Right;  . TOTAL KNEE ARTHROPLASTY Left 03/23/2016   Procedure: LEFT TOTAL KNEE ARTHROPLASTY;  Surgeon: [name], MD;  Location: [facility];  Service: Orthopedics;  Laterality: Left;   Family History  Problem Relation Age of Onset  . Heart attack Mother        Deceased age 8  . Colon cancer Father 60       deceased age 65 from colon ca.  . Hypertension Brother   . Colon polyps Daughter   . Uterine cancer Daughter 20  . Brain cancer Maternal Uncle   . Stroke Maternal Grandmother   . Other Paternal Uncle 12       possible cancer  . Melanoma Daughter 78  . Melanoma Niece 25       d. 83   . Esophageal cancer Neg Hx   . Stomach cancer Neg Hx   . Rectal cancer Neg Hx    Social History   Socioeconomic History  . Marital status: Married    Spouse name: Not on file  . Number of children: 5  . Years of education: Not on file  . Highest education level: Not on file  Occupational History  . Occupation: Retired  Tobacco Use  . Smoking status: Former Smoker    Packs/day: 0.25    Years: 40.00    Pack years: 10.00    Types: Cigarettes    Quit date: 02/24/1967    Years since quitting: 53.1  . Smokeless tobacco: Never Used  Vaping Use  . Vaping Use: Never used  Substance and Sexual Activity  . Alcohol use: Yes    Alcohol/week: 1.0 standard drink    Types: 1 Standard drinks or equivalent per  week    Comment: occasionally  . Drug use: No  . Sexual activity: Not Currently    Partners: Male    Comment: husband vasectomy  Other Topics Concern  . Not on file  Social History Narrative  . Not on file   Social Determinants of Health   Financial Resource Strain: Low Risk   . Difficulty of Paying Living Expenses: Not hard at all  Food Insecurity: No Food Insecurity  . Worried About Charity fundraiser in the Last Year: Never true  . Ran Out of Food in the Last Year: Never true  Transportation Needs: No Transportation Needs  . Lack of Transportation (Medical): No  . Lack of Transportation (Non-Medical): No  Physical Activity: Sufficiently Active  . Days of Exercise per Week: 5 days  . Minutes of Exercise per Session: 30 min  Stress: No Stress Concern Present  . Feeling of Stress : Not at all  Social Connections: Socially Integrated  . Frequency of Communication with Friends and Family: More than three times a week  . Frequency of Social Gatherings with Friends and Family: Once a week  . Attends Religious Services: 1 to 4 times per year  . Active Member of Clubs or Organizations: Yes  . Attends Archivist Meetings: 1 to 4 times per year  . Marital Status:  Married    Tobacco Counseling Counseling given: Not Answered   Clinical Intake:  Pre-visit preparation completed: Yes  Pain : No/denies pain     Nutritional Risks: None Diabetes: No  How often do you need to have someone help you when you read instructions, pamphlets, or other written materials from your doctor or pharmacy?: 1 - Never What is the last grade level you completed in school?: High School Graduate  Diabetic? no  Interpreter Needed?: No  Information entered by :: Cindie Rajagopalan N. Torrey Horseman, LPN   Activities of Daily Living In your present state of health, do you have any difficulty performing the following activities: 03/21/2020 03/14/2020  Hearing? N N  Vision? N N  Difficulty concentrating or making decisions? N N  Walking or climbing stairs? Y Y  Dressing or bathing? N N  Doing errands, shopping? N N  Preparing Food and eating ? N -  Using the Toilet? N -  In the past six months, have you accidently leaked urine? N -  Do you have problems with loss of bowel control? N -  Managing your Medications? N -  Managing your Finances? N -  Housekeeping or managing your Housekeeping? N -  Some recent data might be hidden    Patient Care Team: [name], MD as PCP - General (Internal Medicine) Josue Hector, MD as PCP - Cardiology (Cardiology)  Indicate any recent Medical Services you Thompson have received from other than [facility] providers in the past year (date Thompson be approximate).     Assessment:   This is a routine wellness examination for Miranda Thompson.  Hearing/Vision screen No exam data present  Dietary issues and exercise activities discussed: Current Exercise Habits: Home exercise routine, Type of exercise: walking;strength training/weights;Other - see comments (stationary bike), Time (Minutes): 30, Frequency (Times/Week): 5, Weekly Exercise (Minutes/Week): 150, Exercise limited by: cardiac condition(s);orthopedic condition(s);respiratory conditions(s)  Goals     . Client understands the importance of follow-up with providers by attending scheduled visits     My goal is to continue to stay healthy and away from Covid.      Depression Screen [facility] 2/9  Scores 03/21/2020 03/14/2020 03/14/2020 02/20/2019 02/14/2018 05/17/2014 11/10/2012  PHQ - 2 Score 0 0 0 0 0 0 0    Fall Risk Fall Risk  03/21/2020 03/14/2020 03/14/2020 02/20/2019 02/20/2019  Falls in the past year? 0 0 0 0 0  Number falls in past yr: 0 - 0 - 0  Injury with Fall? 0 - 0 - 0  Risk for fall due to : No Fall Risks - - - -    FALL RISK PREVENTION PERTAINING TO THE HOME:  Any stairs in or around the home? Yes  If so, are there any without handrails? No  Home free of loose throw rugs in walkways, pet beds, electrical cords, etc? Yes  Adequate lighting in your home to reduce risk of falls? Yes   ASSISTIVE DEVICES UTILIZED TO PREVENT FALLS:  Life alert? No  Use of a cane, walker or w/c? No  Grab bars in the bathroom? Yes  Shower chair or bench in shower? Yes  Elevated toilet seat or a handicapped toilet? Yes   TIMED UP AND GO:  Was the test performed? No .  Length of time to ambulate 10 feet: 0 sec.   Gait steady and fast without use of assistive device  Cognitive Function: No flowsheet data found.         Immunizations Immunization History  Administered Date(s) Administered  . Fluad Quad(high Dose 65+) 11/08/2019  . Influenza Split 12/03/2011  . Influenza Whole 11/30/2007, 11/30/2008, 12/03/2009, 11/24/2010  . Influenza, High Dose Seasonal PF 11/19/2015, 12/02/2016, 12/17/2017, 11/22/2018  . Influenza,inj,Quad PF,6+ Mos 11/10/2012, 11/08/2013, 11/06/2014  . Influenza-Unspecified 12/02/2016, 11/22/2018  . PFIZER(Purple Top)SARS-COV-2 Vaccination 03/14/2019, 04/04/2019, 11/18/2019  . Pneumococcal Conjugate-13 05/09/2014  . Pneumococcal Polysaccharide-23 11/07/2007  . Tdap 05/09/2014  . Zoster 12/11/2009  . Zoster Recombinat (Shingrix) 04/10/2018, 01/08/2019    TDAP  status: Up to date  Flu Vaccine status: Up to date  Pneumococcal vaccine status: Up to date  Covid-19 vaccine status: Completed vaccines  Qualifies for Shingles Vaccine? Yes   Zostavax completed Yes   Shingrix Completed?: Yes  Screening Tests Health Maintenance  Topic Date Due  . COLONOSCOPY (Pts 45-21yrs Insurance coverage will need to be confirmed)  03/14/2021 (Originally 08/08/2019)  . TETANUS/TDAP  05/08/2024  . INFLUENZA VACCINE  Completed  . DEXA SCAN  Completed  . COVID-19 Vaccine  Completed  . PNA vac Low Risk Adult  Completed    Health Maintenance  There are no preventive care reminders to display for this patient.  Colorectal cancer screening: No longer required.   Mammogram status: Completed 11/01/2019. Repeat every year  Bone Density status: Completed 06/09/2017. Results reflect: Bone density results: OSTEOPENIA. Repeat every 2 years.  Lung Cancer Screening: (Low Dose CT Chest recommended if Age 79-80 years, 30 pack-year currently smoking OR have quit w/in 15years.) does not qualify.   Lung Cancer Screening Referral: no  Additional Screening:  Hepatitis C Screening: does not qualify; Completed no  Vision Screening: Recommended annual ophthalmology exams for early detection of glaucoma and other disorders of the eye. Is the patient up to date with their annual eye exam?  Yes  Who is the provider or what is the name of the office in which the patient attends annual eye exams? Warden Fillers, MD. If pt is not established with a provider, would they like to be referred to a provider to establish care? No .   Dental Screening: Recommended annual dental exams for proper oral hygiene  Community Resource Referral /  Chronic Care Management: CRR required this visit?  No   CCM required this visit?  No      Plan:     I have personally reviewed and noted the following in the patient's chart:   . Medical and social history . Use of alcohol, tobacco or illicit  drugs  . Current medications and supplements . Functional ability and status . Nutritional status . Physical activity . Advanced directives . List of other physicians . Hospitalizations, surgeries, and ER visits in previous 12 months . Vitals . Screenings to include cognitive, depression, and falls . Referrals and appointments  In addition, I have reviewed and discussed with patient certain preventive protocols, quality metrics, and best practice recommendations. A written personalized care plan for preventive services as well as general preventive health recommendations were provided to patient.     Sheral Flow, LPN   0/08/1217   Nurse Notes:  Patient is cogitatively intact. There were no vitals filed for this visit. There is no height or weight on file to calculate BMI. Patient stated that she has no issues with gait or balance; does not use any assistive devices.

## 2020-03-31 ENCOUNTER — Encounter: Payer: Self-pay | Admitting: Internal Medicine

## 2020-03-31 ENCOUNTER — Ambulatory Visit
Admission: RE | Admit: 2020-03-31 | Discharge: 2020-03-31 | Disposition: A | Discharge status: Home / Self Care | Payer: Medicare PPO | Source: Ambulatory Visit | Attending: Internal Medicine | Admitting: Internal Medicine

## 2020-03-31 ENCOUNTER — Other Ambulatory Visit: Payer: Self-pay | Admitting: Internal Medicine

## 2020-03-31 ENCOUNTER — Other Ambulatory Visit: Payer: Self-pay

## 2020-03-31 DIAGNOSIS — G8929 Other chronic pain: Secondary | ICD-10-CM

## 2020-03-31 DIAGNOSIS — M48061 Spinal stenosis, lumbar region without neurogenic claudication: Secondary | ICD-10-CM | POA: Diagnosis not present

## 2020-03-31 DIAGNOSIS — M545 Low back pain, unspecified: Secondary | ICD-10-CM | POA: Diagnosis not present

## 2020-03-31 DIAGNOSIS — M5442 Lumbago with sciatica, left side: Secondary | ICD-10-CM

## 2020-03-31 DIAGNOSIS — M47816 Spondylosis without myelopathy or radiculopathy, lumbar region: Secondary | ICD-10-CM

## 2020-04-05 NOTE — Progress Notes (Signed)
Subjective:   I, Miranda Thompson, LAT, ATC acting as a scribe for Miranda Leader, MD.  I'm seeing this patient as a consultation for Dr. Cathlean Cower. Note will be routed back to referring provider/PCP.  CC: Chronic low back pain  HPI: Pt is a 87 y/o female c/o chronic LBP for at least the last 6 months w/ no known MOI. Pt locates pain to L low back. Pt reports a "heaviness" in her legs that varies from day to day. Pt does walk 1-2 miles a few days a week and rides a recumbant bike. Pt had an MRI done on 03/31/20 that was ordered by her PCP.  She denies any isolated leg weakness.  She feels generally fatigued and weak with exercise.  Radiating pain: yes- L leg- sometiimes LE numbness/tingling: no LE weakness: yes- "heaviness" Aggravates: just depends on the day Rx tried: stretching, leg raises,   Dx imaging: 03/31/20 L-spine MRI  Past medical history, Surgical history, Family history, Social history, Allergies, and medications have been entered into the medical record, reviewed. Significant for COPD.  Review of Systems: No new headache, visual changes, nausea, vomiting, diarrhea, constipation, dizziness, abdominal pain, skin rash, fevers, chills, night sweats, weight loss, swollen lymph nodes, body aches, joint swelling, muscle aches, chest pain, shortness of breath, mood changes, visual or auditory hallucinations.   Objective:    Vitals:   04/08/20 1435  BP: 139/69  Pulse: 95  SpO2: 98%   General: Well Developed, well nourished, and in no acute distress.  Neuro/Psych: Alert and oriented x3, extra-ocular muscles intact, able to move all 4 extremities, sensation grossly intact. Skin: Warm and dry, no rashes noted.  Respiratory: Not using accessory muscles, speaking in full sentences, trachea midline.  Cardiovascular: Pulses palpable, no extremity edema. Abdomen: Does not appear distended. MSK: L-spine normal-appearing nontender midline.  Decreased lumbar motion limited by extension. Lower  extremity strength reflexes and sensation are equal normal throughout.  Lab and Radiology Results MR Lumbar Spine Wo Contrast  Result Date: 03/31/2020 CLINICAL DATA:  Chronic bilateral low back pain with bilateral sciatica. EXAM: MRI LUMBAR SPINE WITHOUT CONTRAST TECHNIQUE: Multiplanar, multisequence MR imaging of the lumbar spine was performed. No intravenous contrast was administered. COMPARISON:  None. FINDINGS: Segmentation:  Standard. Alignment:  Physiologic. Vertebrae: No fracture, evidence of discitis, or aggressive bone lesion. S1 vertebral body hemangioma. Conus medullaris and cauda equina: Conus extends to the T12 level. Conus and cauda equina appear normal. Paraspinal and other soft tissues: No acute paraspinal abnormality. Left adnexal cyst measuring 3.2 cm. Disc levels: Disc spaces: Degenerative disease with disc height loss throughout the lumbar spine. Reactive endplate changes at Y7-0, L4-5 and L5-S1. T12-L1: Minimal broad-based disc bulge. Mild bilateral facet arthropathy. No evidence of neural foraminal stenosis. No central canal stenosis. L1-L2: Mild broad-based disc bulge. Mild bilateral facet arthropathy. Mild bilateral foraminal narrowing. No central canal stenosis. L2-L3: Broad-based disc bulge. Mild bilateral facet arthropathy. No evidence of neural foraminal stenosis. No central canal stenosis. Right lateral recess stenosis. L3-L4: Broad-based disc bulge. Moderate bilateral facet arthropathy. Severe spinal stenosis. Moderate right and mild left foraminal stenosis. L4-L5: Broad-based disc bulge with a central disc protrusion. Severe left and moderate right facet arthropathy. Bilateral lateral recess stenosis. Mild left foraminal stenosis. Moderate spinal stenosis. L5-S1: Broad-based disc bulge with a small left paracentral disc protrusion which contacts the left intraspinal S1 nerve root. Mild bilateral facet arthropathy. No right and mild left foraminal stenosis. No central canal  stenosis. IMPRESSION: 1. Diffuse lumbar  spine spondylosis as described above. 2.  No acute osseous injury of the lumbar spine. Electronically Signed   By: Kathreen Devoid   On: 03/31/2020 15:21   VAS [place] LE ART SEG MULTI (Segm&LE Reynauds)  Result Date: 01/11/2020 LOWER EXTREMITY DOPPLER STUDY High Risk Factors: None, no history of smoking. Other Factors: Patient complains of leg heaviness for the past year.                Patient denies any claudication symptoms and no rest pain.  Comparison Study: none Performing Technologist: June Leap RVT  Examination Guidelines: A complete evaluation includes at minimum, Doppler waveform signals and systolic blood pressure reading at the level of bilateral brachial, anterior tibial, and posterior tibial arteries, when vessel segments are accessible. Bilateral testing is considered an integral part of a complete examination. Photoelectric Plethysmograph (PPG) waveforms and toe systolic pressure readings are included as required and additional duplex testing as needed. Limited examinations for reoccurring indications may be performed as noted.  ABI Findings: +---------+------------------+-----+---------+--------+ Right    Rt Pressure (mmHg)IndexWaveform Comment  +---------+------------------+-----+---------+--------+ Brachial 135                                      +---------+------------------+-----+---------+--------+ ATA      152               1.12 triphasic         +---------+------------------+-----+---------+--------+ PTA      144               1.06 triphasic         +---------+------------------+-----+---------+--------+ PERO     145               1.07 triphasic         +---------+------------------+-----+---------+--------+ Great Toe111               0.82 Normal            +---------+------------------+-----+---------+--------+ +---------+------------------+-----+---------+-------+ Left     Lt Pressure (mmHg)IndexWaveform  Comment +---------+------------------+-----+---------+-------+ Brachial 136                                     +---------+------------------+-----+---------+-------+ ATA      149               1.10 triphasic        +---------+------------------+-----+---------+-------+ PTA      144               1.06 triphasic        +---------+------------------+-----+---------+-------+ PERO     144               1.06 triphasic        +---------+------------------+-----+---------+-------+ Great Toe95                0.70 Normal           +---------+------------------+-----+---------+-------+ All CW waveforms are multiphasic. Venous interference noted.  Summary: Right: Resting right ankle-brachial index is within normal range. No evidence of significant right lower extremity arterial disease. The right toe-brachial index is normal. Left: Resting left ankle-brachial index is within normal range. No evidence of significant left lower extremity arterial disease. The left toe-brachial index is normal.  *See table(s) above for measurements and observations.  Electronically signed by Larae Grooms MD on 01/11/2020 at  8:27:09 PM.    Final    I, Miranda Thompson, personally (independently) visualized and performed the interpretation of the Lspine MRI images attached in this note.   Impression and Recommendations:    Assessment and Plan: 87 y.o. female with leg heaviness bilaterally.  I believe patient is describing leg weakness or at least leg fatigue.  She has had good work-up so far including ABI and L-spine MRI.  L-spine MRI did show lumbar spinal stenosis which could be a cause here.  She does not have significant neural impingement seen on MRI and does not have much pain in her legs.  I think she is a good candidate for physical therapy before proceeding with more interventional options such as epidural steroid injection.  Plan for referral to PT and check back again in about 6 weeks.  If not  better would consider epidural steroid injection.Marland Kitchen  PDMP not reviewed this encounter. Orders Placed This Encounter  Procedures  . Ambulatory referral to Physical Therapy    Referral Priority:   Routine    Referral Type:   Physical Medicine    Referral Reason:   Specialty Services Required    Requested Specialty:   Physical Therapy   No orders of the defined types were placed in this encounter.   Discussed warning signs or symptoms. Please see discharge instructions. Patient expresses understanding.   The above documentation has been reviewed and is accurate and complete Miranda Thompson, M.D.

## 2020-04-08 ENCOUNTER — Other Ambulatory Visit: Payer: Self-pay

## 2020-04-08 ENCOUNTER — Ambulatory Visit (INDEPENDENT_AMBULATORY_CARE_PROVIDER_SITE_OTHER): Payer: Medicare PPO | Admitting: Family Medicine

## 2020-04-08 VITALS — BP 139/69 | HR 95 | Ht 63.5 in | Wt 134.6 lb

## 2020-04-08 DIAGNOSIS — M48061 Spinal stenosis, lumbar region without neurogenic claudication: Secondary | ICD-10-CM

## 2020-04-08 DIAGNOSIS — R29898 Other symptoms and signs involving the musculoskeletal system: Secondary | ICD-10-CM

## 2020-04-08 NOTE — Patient Instructions (Addendum)
Thank you for coming in today.  Please complete the exercises that the athletic trainer went over with you: View at [email or web address] using code: [email or web address]  I've referred you to Physical Therapy.  Let us know if you don't hear from them in one week.  If not improving I can arrange for the back injections as well.   Recheck with me in about 6 weeks.   Let me know.

## 2020-04-16 ENCOUNTER — Ambulatory Visit: Payer: Medicare PPO | Admitting: Physical Therapy

## 2020-04-16 ENCOUNTER — Encounter: Payer: Self-pay | Admitting: Physical Therapy

## 2020-04-16 ENCOUNTER — Other Ambulatory Visit: Payer: Self-pay

## 2020-04-16 VITALS — BP 121/52 | HR 77

## 2020-04-16 DIAGNOSIS — M6281 Muscle weakness (generalized): Secondary | ICD-10-CM

## 2020-04-16 DIAGNOSIS — R262 Difficulty in walking, not elsewhere classified: Secondary | ICD-10-CM | POA: Diagnosis not present

## 2020-04-16 DIAGNOSIS — M5440 Lumbago with sciatica, unspecified side: Secondary | ICD-10-CM

## 2020-04-16 NOTE — Patient Instructions (Signed)
Access Code: [email or web address] URL: [email or web address] Date: 04/16/2020 Prepared by: Daleen Bo  Exercises Bridge with Hip Abduction and Resistance - 2 x daily - 7 x weekly - 2 sets - 10 reps Supine Single Knee to Chest Stretch - 2 x daily - 7 x weekly - 1 sets - 10 reps - 10s hold Supine Figure 4 Piriformis Stretch - 2 x daily - 7 x weekly - 1 sets - 3 reps - 30 hold

## 2020-04-16 NOTE — Therapy (Signed)
[facility] [address] [place], [place], [address] Phone: [phone]   Fax:  [phone]  Physical Therapy Evaluation  Patient Details  Name: Miranda Thompson MRN: [id number] Date of Birth: [birth year] Referring Provider (PT): Dr. Georgina Snell   Encounter Date: 04/16/2020   PT End of Session - 04/16/20 1113    Visit Number 1    Number of Visits 17    Date for PT Re-Evaluation 05/16/20    Authorization Type [employer]    PT Start Time 5809    PT Stop Time 1110    PT Time Calculation (min) 55 min    Activity Tolerance Patient tolerated treatment well    Behavior During Therapy [facility] for tasks assessed/performed           Past Medical History:  Diagnosis Date  . Adenomatous colon polyp   . Allergic rhinitis   . Anemia    pt states no anemia in her past hx thst she is aware of   . Atrial fibrillation ([place])    goes in and out of this rhythm- takes Tenormin  . Breast cancer, left breast ([place]) 1994 with lumpectomy   chemo,tamox,radiation  . Bronchiectasis with acute exacerbation ([place])   . COPD (chronic obstructive pulmonary disease) ([place])   . Diverticulosis of colon   . DJD (degenerative joint disease)   . Dysrhythmia    palpitations  . Family history of brain cancer   . Family history of colon cancer 02/14/2018  . Family history of colon cancer   . Family history of malignant neoplasm of gastrointestinal tract   . Family history of melanoma   . Family history of uterine cancer   . Fibromyalgia   . GERD (gastroesophageal reflux disease) 02/14/2018  . Glaucoma   . Hemoptysis   . History of pneumonia   . Hypercholesteremia    pt denies  . Hypertension   . Low back pain syndrome   . Neuromuscular disorder (HCC)    hx fibromyalgia   . Osteopenia   . Pneumonia    hx  . Pseudomonas infection   . Rotator cuff syndrome of right shoulder   . Spinal stenosis     Past Surgical History:  Procedure Laterality Date  .  CATARACT EXTRACTION Right   . COLONOSCOPY    . left breast lumpectomy and LN dissection  06/1992   Dr. [name]  . LUNG BIOPSY  1968   TSurg---Dr. [name]  . LUNG BIOPSY  2012   dr [name]  . POLYPECTOMY    . REVERSE SHOULDER ARTHROPLASTY Right 08/02/2015   Procedure: RIGHT REVERSE TOTAL SHOULDER ARTHROPLASTY;  Surgeon: [name], MD;  Location: [facility];  Service: Orthopedics;  Laterality: Right;  . REVERSE TOTAL SHOULDER ARTHROPLASTY Right 08/02/2015  . TONSILLECTOMY    . TOTAL KNEE ARTHROPLASTY Right 01/21/2015   Procedure: TOTAL RIGHT KNEE ARTHROPLASTY;  Surgeon: [name], MD;  Location: [facility];  Service: Orthopedics;  Laterality: Right;  . TOTAL KNEE ARTHROPLASTY Left 03/23/2016   Procedure: LEFT TOTAL KNEE ARTHROPLASTY;  Surgeon: [name], MD;  Location: [facility];  Service: Orthopedics;  Laterality: Left;    Vitals:   04/16/20 1038  BP: (!) 121/52  Pulse: 77  SpO2: 99%      Subjective Assessment - 04/16/20 1020    Subjective Pt states the back pain and weakness happened about a year ago. She has had chiro in the past. She states she fees it in the L post hip and  into the back. The pain will occasionally shoot down to the toes. Pt states the pain gets up to a 9/10. She denies NT. She works out about 45-50 mins a day on a stationary bike and walks outside regularly for exercise but the back and leg pain can interfere. She states it used to be COPD that limits her but now it is the leg heaviness and shooting pain limiting her. She has been doing exercises from Dr. Georgina Snell and that has helped. She feels that the heaviness in the legs has gotten mildly better as well. She states she used to wake up in the mornings with leg heaviness and it would stay constant, but now it has improved. She denies feelings of coolness or sensation changes. Pt states the pain is superficial in the hip and she is able to touch it. Pt denies red flag questions for cancer, but does endorse increased pain at night.  Denies crepitus in hips or feelings of popping/catching. Pt states the pain is constant/consistent throughout the day. Pt states she does sit ups, crunches, and some light 5 lb dumbbells for exercise as well.    Limitations Sitting;Lifting;Walking;Standing    How long can you walk comfortably? Variable depending on pain    Diagnostic tests MRI    Patient Stated Goals Return to full activity without pain. Get back to gardening and exercise without pain.    Currently in Pain? Yes    Pain Score 3     Pain Location Hip    Pain Orientation Lower;Left    Pain Descriptors / Indicators Shooting;Sharp;Aching    Pain Type Chronic pain    Pain Radiating Towards all the way down to toes    Pain Onset More than a month ago    Pain Frequency Intermittent    Aggravating Factors  S/L leg raises, prone hip extensions, walking up and down hills, stairs, carrying, lifting    Pain Relieving Factors exercises, home laser unit, flexion, leaning/holding on the buggy, Tylenol    Multiple Pain Sites No              [facility] PT Assessment - 04/16/20 0001      Assessment   Medical Diagnosis Low back pain with sciatica    Referring Provider (PT) Dr. Georgina Snell    Prior Therapy Shoulder and TKA      Precautions   Precautions None      Restrictions   Weight Bearing Restrictions No      Balance Screen   Has the patient fallen in the past 6 months No    Has the patient had a decrease in activity level because of a fear of falling?  No    Is the patient reluctant to leave their home because of a fear of falling?  No      Home Ecologist residence      Prior Function   Level of Independence Independent    Vocation Retired      Associate Professor   Overall Cognitive Status Within Functional Limits for tasks assessed      Observation/Other Assessments   Other Surveys  Oswestry Disability Index    Oswestry Disability Index  10% impairment      Functional Tests   Functional tests Sit  to Stand      Sit to Stand   Comments no UE support required, dynamic valgus, increased fwd trunk lean      Posture/Postural Control   Posture/Postural Control Postural  limitations    Postural Limitations Decreased lumbar lordosis;Increased thoracic kyphosis      ROM / Strength   AROM / PROM / Strength AROM;Strength      AROM   AROM Assessment Site Lumbar    Lumbar Flexion WFL    Lumbar Extension 10%    Lumbar - Right Side Bend 45%    Lumbar - Left Side Bend 45%    Lumbar - Right Rotation 50%    Lumbar - Left Rotation 50%      Strength   Overall Strength Deficits    Overall Strength Comments 4-/5 hips in all directions      Flexibility   Soft Tissue Assessment /Muscle Length yes    Hamstrings limited in 90/90 position >30deg    Piriformis moderately limited in figure 4      Palpation   Spinal mobility limited extension with PA    Palpation comment TTP with L sided L/S paraspinals, hip rotators, glutes      Special Tests    Special Tests Lumbar;Sacrolliac Tests    Lumbar Tests FABER test;Straight Leg Raise    Sacroiliac Tests  Sacral Compression      FABER test   findings Positive    Side LEft      Straight Leg Raise   Findings Positive    Side  Left      Sacral Compression   Findings Positive    Side  Left      Ambulation/Gait   Ambulation/Gait Yes    Gait Pattern Decreased step length - left;Decreased step length - right;Decreased stride length;Decreased hip/knee flexion - right;Decreased hip/knee flexion - left;Trendelenburg;Decreased trunk rotation      Balance   Balance Assessed Yes      Standardized Balance Assessment   Standardized Balance Assessment Five Times Sit to Stand    Five times sit to stand comments  12.6s                      Objective measurements completed on examination: See above findings.       [facility] Adult PT Treatment/Exercise - 04/16/20 0001      Exercises   Exercises Lumbar;Knee/Hip      Lumbar Exercises:  Stretches   Single Knee to Chest Stretch 5 reps;10 seconds    Single Knee to Chest Stretch Limitations 10s 10x    Figure 4 Stretch 30 seconds;3 reps      Lumbar Exercises: Supine   Bridge with clamshell 20 reps    Bridge with Ball Squeeze Limitations red TB 2x10      Manual Therapy   Manual Therapy Joint mobilization;Soft tissue mobilization    Soft tissue mobilization ischemic pressure- L lateral rotators, gluteals                  PT Education - 04/16/20 1112    Education Details MOI, diagonosis, prognosis, anatomy, exercise progression, POC, HEP    Person(s) Educated Patient    Methods Explanation;Demonstration;Handout    Comprehension Verbalized understanding;Returned demonstration            PT Short Term Goals - 04/16/20 1136      PT SHORT TERM GOAL #1   Title Pt will be independent with HEP to demonstrate synthesis of PT and compliance education.    Time 2    Period Weeks    Status New      PT SHORT TERM GOAL #2   Title Pt will demonstrate at least  60% pain free ROM in all directions in order to demonstrate functional improvement in L/S ROM.    Time 4    Period Weeks    Status New             PT Long Term Goals - 04/16/20 1137      PT LONG TERM GOAL #1   Title Pt will be able to demonstrate good lifitng mechanics with at least 25lbs in order to demonstrate return to simulated household duties.    Baseline 6    Period Weeks    Status New      PT LONG TERM GOAL #2   Title Pt will report ability to lift, squat, and walk without pain in order to demonstrate functional improvement in daily/community mobility.    Time 8    Period Weeks    Status New                  Plan - 04/16/20 1114    Clinical Impression Statement Pt is a 87 y.o. female presenting to PT eval today for CC of LBP and bilateral leg weakness. Pt presents with decreased lumbar and hip ROM, hip strength deficits, gait deviations, and lateral hip and back pain. Pt's leg  weakness s/s are consistent with previous history of spinal stenosis. However, pt also presents with symptoms that are consistent with L sided sciatica due to hip lateral rotator sensitivity. Pt's current impairments limit her full participation in ADL, household duties, and physical fitness/exercise. Pt would benefit from continued skilled therapy in order to maximize functional mobility, prevent functional decline, and promote improvement towards full community mobility/exericse.    Personal Factors and Comorbidities Age;Time since onset of injury/illness/exacerbation    Examination-Activity Limitations Transfers;Bed Mobility;Lift;Squat;Bend;Carry;Stairs    Examination-Participation Restrictions Cleaning;Yard Work;Community Activity;Other    Stability/Clinical Decision Making Evolving/Moderate complexity    Clinical Decision Making Moderate    Rehab Potential Good    PT Frequency 2x / week    PT Duration 8 weeks    PT Treatment/Interventions ADLs/Self Care Home Management;Aquatic Therapy;Electrical Stimulation;Iontophoresis 4mg /ml Dexamethasone;Moist Heat;Traction;Ultrasound;Gait training;Stair training;Functional mobility training;Therapeutic activities;Therapeutic exercise;Balance training;Neuromuscular re-education;Patient/family education;Manual techniques;Passive range of motion;Dry needling;Taping;Joint Manipulations;Spinal Manipulations    PT Next Visit Plan Review HEP, STS with band, forward flexion stretch, hip ABD hold    PT Home Exercise Plan Printed handout provided    Consulted and Agree with Plan of Care Patient           Patient will benefit from skilled therapeutic intervention in order to improve the following deficits and impairments:  Decreased mobility,Difficulty walking,Increased muscle spasms,Abnormal gait,Decreased range of motion,Decreased strength,Pain,Postural dysfunction,Impaired flexibility,Decreased activity tolerance  Visit Diagnosis: Back pain of lumbar region  with sciatica - Plan: PT plan of care cert/re-cert  Muscle weakness (generalized) - Plan: PT plan of care cert/re-cert  Difficulty walking - Plan: PT plan of care cert/re-cert     Problem List Patient Active Problem List   Diagnosis Date Noted  . 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 Hyponatremia 03/20/2020  . Hyperglycemia 02/20/2019  . Genetic testing 11/11/2018  . Family history of uterine cancer   . Family history of brain cancer   . Family history of melanoma   . GERD (gastroesophageal reflux disease) 02/14/2018  . Glaucoma 02/14/2018  . Family history of colon cancer 02/14/2018  . Encounter for well adult exam with abnormal findings 02/14/2018  . Osteoporosis 02/14/2018  . History of left breast cancer 01/13/2018  . S/P knee replacement 03/23/2016  .  S/P shoulder replacement 08/02/2015  . Palpitations 08/20/2014  . Episode of hypertension 08/20/2014  . COPD, moderate ([place]) 05/09/2014  . Premature atrial contractions 11/08/2013  . Cardiac arrhythmia 05/10/2013  . Trochanteric bursitis of left hip 02/02/2012  . Osteoarthritis of left hip 06/25/2011  . Leg length inequality 06/25/2011  . Left ovarian cyst 01/20/2011  . Elevated lipids 11/04/2009  . Acute cystitis 11/04/2009  . LOW BACK PAIN SYNDROME 05/07/2008  . Bronchiectasis with acute exacerbation ([place]) 03/18/2007  . Hemoptysis 03/18/2007  . PNEUMONIA 03/07/2007  . Disorder of bone and cartilage 03/07/2007  . BRONCHIECTASIS 03/04/2007  . Osteoarthritis 03/04/2007    Daleen Bo PT, DPT 04/16/20 12:12 PM   [facility] [address] [place], [place], [address] Phone: [phone]   Fax:  [phone]  Name: CHRISTANA ANGELICA MRN: [id number] Date of Birth: 03-01-1933

## 2020-04-23 ENCOUNTER — Encounter: Payer: Medicare PPO | Admitting: Physical Therapy

## 2020-04-25 ENCOUNTER — Other Ambulatory Visit: Payer: Self-pay

## 2020-04-25 ENCOUNTER — Encounter: Payer: Self-pay | Admitting: Physical Therapy

## 2020-04-25 ENCOUNTER — Ambulatory Visit: Payer: Medicare PPO | Admitting: Physical Therapy

## 2020-04-25 DIAGNOSIS — M5440 Lumbago with sciatica, unspecified side: Secondary | ICD-10-CM | POA: Diagnosis not present

## 2020-04-25 DIAGNOSIS — M6281 Muscle weakness (generalized): Secondary | ICD-10-CM | POA: Diagnosis not present

## 2020-04-25 DIAGNOSIS — R262 Difficulty in walking, not elsewhere classified: Secondary | ICD-10-CM | POA: Diagnosis not present

## 2020-04-25 NOTE — Therapy (Signed)
[facility] [address] [place], [place], [address] Phone: [phone]   Fax:  [phone]  Physical Therapy Treatment  Patient Details  Name: Miranda Thompson MRN: [id number] Date of Birth: [birth year] Referring Provider (PT): Dr. Georgina Snell   Encounter Date: 04/25/2020   PT End of Session - 04/25/20 1453    Visit Number 2    Number of Visits 17    Date for PT Re-Evaluation 05/16/20    Authorization Type [employer]    PT Start Time 7680    PT Stop Time 1430    PT Time Calculation (min) 45 min    Activity Tolerance Patient tolerated treatment well    Behavior During Therapy [facility] for tasks assessed/performed           Past Medical History:  Diagnosis Date  . Adenomatous colon polyp   . Allergic rhinitis   . Anemia    pt states no anemia in her past hx thst she is aware of   . Atrial fibrillation ([place])    goes in and out of this rhythm- takes Tenormin  . Breast cancer, left breast ([place]) 1994 with lumpectomy   chemo,tamox,radiation  . Bronchiectasis with acute exacerbation ([place])   . COPD (chronic obstructive pulmonary disease) ([place])   . Diverticulosis of colon   . DJD (degenerative joint disease)   . Dysrhythmia    palpitations  . Family history of brain cancer   . Family history of colon cancer 02/14/2018  . Family history of colon cancer   . Family history of malignant neoplasm of gastrointestinal tract   . Family history of melanoma   . Family history of uterine cancer   . Fibromyalgia   . GERD (gastroesophageal reflux disease) 02/14/2018  . Glaucoma   . Hemoptysis   . History of pneumonia   . Hypercholesteremia    pt denies  . Hypertension   . Low back pain syndrome   . Neuromuscular disorder (HCC)    hx fibromyalgia   . Osteopenia   . Pneumonia    hx  . Pseudomonas infection   . Rotator cuff syndrome of right shoulder   . Spinal stenosis     Past Surgical History:  Procedure Laterality Date  .  CATARACT EXTRACTION Right   . COLONOSCOPY    . left breast lumpectomy and LN dissection  06/1992   Dr. [name]  . LUNG BIOPSY  1968   TSurg---Dr. [name]  . LUNG BIOPSY  2012   dr [name]  . POLYPECTOMY    . REVERSE SHOULDER ARTHROPLASTY Right 08/02/2015   Procedure: RIGHT REVERSE TOTAL SHOULDER ARTHROPLASTY;  Surgeon: [name], MD;  Location: [facility];  Service: Orthopedics;  Laterality: Right;  . REVERSE TOTAL SHOULDER ARTHROPLASTY Right 08/02/2015  . TONSILLECTOMY    . TOTAL KNEE ARTHROPLASTY Right 01/21/2015   Procedure: TOTAL RIGHT KNEE ARTHROPLASTY;  Surgeon: [name], MD;  [facility];  Service: Orthopedics;  Laterality: Right;  . TOTAL KNEE ARTHROPLASTY Left 03/23/2016   Procedure: LEFT TOTAL KNEE ARTHROPLASTY;  Surgeon: [name], MD;  Location[facility];  Service: Orthopedics;  Laterality: Left;    There were no vitals filed for this visit.   Subjective Assessment - 04/25/20 1349    Subjective Pt states the leg heaviness has gotten better. She states she is walking much easier. She feels that it is moving easier than before and reports no problem with HEP. She states she is about 60% better.  Limitations Sitting;Lifting;Walking;Standing    How long can you walk comfortably? Variable depending on pain    Diagnostic tests MRI    Patient Stated Goals Return to full activity without pain. Get back to gardening and exercise without pain.    Currently in Pain? Yes    Pain Score 2     Pain Location Hip    Pain Orientation Left;Lower    Pain Descriptors / Indicators Shooting;Aching;Sharp    Pain Type Chronic pain    Pain Onset More than a month ago    Pain Frequency Intermittent                             [facility] Adult PT Treatment/Exercise - 04/25/20 0001      Ambulation/Gait   Ambulation/Gait Yes    Gait Pattern Decreased step length - left;Decreased step length - right;Decreased stride length;Decreased hip/knee flexion - right;Decreased hip/knee  flexion - left;Trendelenburg;Decreased trunk rotation      Posture/Postural Control   Posture/Postural Control Postural limitations    Postural Limitations Decreased lumbar lordosis;Increased thoracic kyphosis      Exercises   Exercises Lumbar;Knee/Hip      Lumbar Exercises: Stretches   Single Knee to Chest Stretch 5 reps;10 seconds    Single Knee to Chest Stretch Limitations 10s 10x    Lower Trunk Rotation --    Pelvic Tilt 20 reps    Pelvic Tilt Limitations 2s    Figure 4 Stretch 30 seconds;3 reps    Other Lumbar Stretch Exercise seated forward flexion stretch 10s 10x      Lumbar Exercises: Standing   Functional Squats 20 reps    Functional Squats Limitations STS 2x10      Lumbar Exercises: Supine   Pelvic Tilt --    Bridge with clamshell 20 reps    Bridge with Ball Squeeze Limitations red TB 2x10      Manual Therapy   Manual Therapy Joint mobilization;Soft tissue mobilization    Joint Mobilization Prone PA grade II-III L3-5    Soft tissue mobilization ischemic pressure- L lateral rotators, gluteals                  PT Education - 04/25/20 1452    Education Details anatomy, exercise progression, HEP, muscle firing patterns, posture    Person(s) Educated Patient    Methods Explanation;Demonstration;Handout    Comprehension Verbalized understanding;Returned demonstration            PT Short Term Goals - 04/16/20 1136      PT SHORT TERM GOAL #1   Title Pt will be independent with HEP to demonstrate synthesis of PT and compliance education.    Time 2    Period Weeks    Status New      PT SHORT TERM GOAL #2   Title Pt will demonstrate at least 60% pain free ROM in all directions in order to demonstrate functional improvement in L/S ROM.    Time 4    Period Weeks    Status New             PT Long Term Goals - 04/16/20 1137      PT LONG TERM GOAL #1   Title Pt will be able to demonstrate good lifitng mechanics with at least 25lbs in order to  demonstrate return to simulated household duties.    Baseline 6    Period Weeks    Status New  PT LONG TERM GOAL #2   Title Pt will report ability to lift, squat, and walk without pain in order to demonstrate functional improvement in daily/community mobility.    Time 8    Period Weeks    Status New                 Plan - 04/25/20 1453    Clinical Impression Statement Pt presents with decreased peripheral symptoms at today's session but continues to have L/S joint mobility and ROM restrictions. Pt had increased L lateral hip rotator muscle extensibility that was improved by manual therapy. Pt was able to progress to standing LE strengthening exercise without increased LE or back pain. Pt required increased VC and TC for pelvic movement coupled with diaphragmatic breathing. Pt likely will need review of PPT and STS form. Pt would benefit from continued skilled therapy in order to maximize functional mobility, prevent functional decline, and promote improvement towards full community mobility/exericse.    Personal Factors and Comorbidities Age;Time since onset of injury/illness/exacerbation    Examination-Activity Limitations Transfers;Bed Mobility;Lift;Squat;Bend;Carry;Stairs    Examination-Participation Restrictions Cleaning;Yard Work;Community Activity;Other    Stability/Clinical Decision Making Evolving/Moderate complexity    Rehab Potential Good    PT Frequency 2x / week    PT Duration 8 weeks    PT Treatment/Interventions ADLs/Self Care Home Management;Aquatic Therapy;Electrical Stimulation;Iontophoresis 4mg /ml Dexamethasone;Moist Heat;Traction;Ultrasound;Gait training;Stair training;Functional mobility training;Therapeutic activities;Therapeutic exercise;Balance training;Neuromuscular re-education;Patient/family education;Manual techniques;Passive range of motion;Dry needling;Taping;Joint Manipulations;Spinal Manipulations    PT Next Visit Plan Review HEP, paloff press, LTR,  box lift    PT Home Exercise Plan Printed handout provided    Consulted and Agree with Plan of Care Patient           Patient will benefit from skilled therapeutic intervention in order to improve the following deficits and impairments:  Decreased mobility,Difficulty walking,Increased muscle spasms,Abnormal gait,Decreased range of motion,Decreased strength,Pain,Postural dysfunction,Impaired flexibility,Decreased activity tolerance  Visit Diagnosis: Back pain of lumbar region with sciatica  Muscle weakness (generalized)  Difficulty walking     Problem List Patient Active Problem List   Diagnosis Date Noted  . 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 Hyponatremia 03/20/2020  . Hyperglycemia 02/20/2019  . Genetic testing 11/11/2018  . Family history of uterine cancer   . Family history of brain cancer   . Family history of melanoma   . GERD (gastroesophageal reflux disease) 02/14/2018  . Glaucoma 02/14/2018  . Family history of colon cancer 02/14/2018  . Encounter for well adult exam with abnormal findings 02/14/2018  . Osteoporosis 02/14/2018  . History of left breast cancer 01/13/2018  . S/P knee replacement 03/23/2016  . S/P shoulder replacement 08/02/2015  . Palpitations 08/20/2014  . Episode of hypertension 08/20/2014  . COPD, moderate ([place]) 05/09/2014  . Premature atrial contractions 11/08/2013  . Cardiac arrhythmia 05/10/2013  . Trochanteric bursitis of left hip 02/02/2012  . Osteoarthritis of left hip 06/25/2011  . Leg length inequality 06/25/2011  . Left ovarian cyst 01/20/2011  . Elevated lipids 11/04/2009  . Acute cystitis 11/04/2009  . LOW BACK PAIN SYNDROME 05/07/2008  . Bronchiectasis with acute exacerbation ([place]) 03/18/2007  . Hemoptysis 03/18/2007  . PNEUMONIA 03/07/2007  . Disorder of bone and cartilage 03/07/2007  . BRONCHIECTASIS 03/04/2007  . Osteoarthritis 03/04/2007   Daleen Bo PT, DPT 04/25/20 3:11 PM   [facility] [address] [place], [place], [address] Phone: [phone]   Fax:  [phone]  Name: Miranda Thompson MRN: [id number] Date of  Birth: [birth year]

## 2020-04-25 NOTE — Patient Instructions (Signed)
Access Code: [email or web address] URL: [email or web address] Date: 04/25/2020 Prepared by: Daleen Bo  Exercises Supine Piriformis Stretch with Foot on Ground - 2 x daily - 7 x weekly - 1 sets - 3 reps - 30 hold Sit to Stand - 2 x daily - 7 x weekly - 2 sets - 10 reps Supine Posterior Pelvic Tilt - 2 x daily - 7 x weekly - 2 sets - 10 reps - 2 hold

## 2020-04-30 ENCOUNTER — Encounter: Payer: Medicare PPO | Admitting: Physical Therapy

## 2020-05-02 ENCOUNTER — Ambulatory Visit: Payer: Medicare PPO | Admitting: Physical Therapy

## 2020-05-02 ENCOUNTER — Other Ambulatory Visit: Payer: Self-pay

## 2020-05-02 ENCOUNTER — Encounter: Payer: Self-pay | Admitting: Physical Therapy

## 2020-05-02 DIAGNOSIS — R262 Difficulty in walking, not elsewhere classified: Secondary | ICD-10-CM

## 2020-05-02 DIAGNOSIS — M6281 Muscle weakness (generalized): Secondary | ICD-10-CM | POA: Diagnosis not present

## 2020-05-02 DIAGNOSIS — M5440 Lumbago with sciatica, unspecified side: Secondary | ICD-10-CM

## 2020-05-02 NOTE — Patient Instructions (Signed)
Access Code: [email or web address] URL: [email or web address] Date: 05/02/2020 Prepared by: Daleen Bo  Exercises Supine Piriformis Stretch with Foot on Ground - 2 x daily - 7 x weekly - 1 sets - 3 reps - 30 hold Sit to Stand - 2 x daily - 4 x weekly - 2 sets - 10 reps Supine Posterior Pelvic Tilt - 2 x daily - 7 x weekly - 2 sets - 10 reps - 2 hold Standing Anti-Rotation Press with Anchored Resistance - 2 x daily - 4 x weekly - 3 sets - 10 reps

## 2020-05-02 NOTE — Therapy (Signed)
[facility] [address] [place], [place], [address] Phone: [phone]   Fax:  [phone]  Physical Therapy Treatment  Patient Details  Name: Miranda Thompson MRN: [id number] Date of Birth: 11-Sep-1933 Referring Provider (PT): Dr. Georgina Snell   Encounter Date: 05/02/2020   PT End of Session - 05/02/20 1442    Visit Number 3    Number of Visits 17    Date for PT Re-Evaluation 05/16/20    Authorization Type [employer]    PT Start Time 5732    PT Stop Time 1430    PT Time Calculation (min) 45 min    Activity Tolerance Patient tolerated treatment well    Behavior During Therapy [facility] for tasks assessed/performed           Past Medical History:  Diagnosis Date  . Adenomatous colon polyp   . Allergic rhinitis   . Anemia    pt states no anemia in her past hx thst she is aware of   . Atrial fibrillation ([place])    goes in and out of this rhythm- takes Tenormin  . Breast cancer, left breast ([place]) 1994 with lumpectomy   chemo,tamox,radiation  . Bronchiectasis with acute exacerbation ([place])   . COPD (chronic obstructive pulmonary disease) ([place])   . Diverticulosis of colon   . DJD (degenerative joint disease)   . Dysrhythmia    palpitations  . Family history of brain cancer   . Family history of colon cancer 02/14/2018  . Family history of colon cancer   . Family history of malignant neoplasm of gastrointestinal tract   . Family history of melanoma   . Family history of uterine cancer   . Fibromyalgia   . GERD (gastroesophageal reflux disease) 02/14/2018  . Glaucoma   . Hemoptysis   . History of pneumonia   . Hypercholesteremia    pt denies  . Hypertension   . Low back pain syndrome   . Neuromuscular disorder (HCC)    hx fibromyalgia   . Osteopenia   . Pneumonia    hx  . Pseudomonas infection   . Rotator cuff syndrome of right shoulder   . Spinal stenosis     Past Surgical History:  Procedure Laterality Date  .  CATARACT EXTRACTION Right   . COLONOSCOPY    . left breast lumpectomy and LN dissection  06/1992   Dr. [name]  . LUNG BIOPSY  1968   TSurg---Dr. [name]  . LUNG BIOPSY  2012   dr [name]  . POLYPECTOMY    . REVERSE SHOULDER ARTHROPLASTY Right 08/02/2015   Procedure: RIGHT REVERSE TOTAL SHOULDER ARTHROPLASTY;  Surgeon: [name], MD;  Location: [facility];  Service: Orthopedics;  Laterality: Right;  . REVERSE TOTAL SHOULDER ARTHROPLASTY Right 08/02/2015  . TONSILLECTOMY    . TOTAL KNEE ARTHROPLASTY Right 01/21/2015   Procedure: TOTAL RIGHT KNEE ARTHROPLASTY;  Surgeon: [name], MD;  Location: [facility];  Service: Orthopedics;  Laterality: Right;  . TOTAL KNEE ARTHROPLASTY Left 03/23/2016   Procedure: LEFT TOTAL KNEE ARTHROPLASTY;  Surgeon: [name], MD;  Location: [facility];  Service: Orthopedics;  Laterality: Left;    There were no vitals filed for this visit.   Subjective Assessment - 05/02/20 1439    Subjective Pt states the leg heaviness is nearly gone and she is moving around much better. She has occasional "twinges" of pain into the L side but they have been very infrequent.    Limitations Sitting;Lifting;Walking;Standing    How  long can you walk comfortably? Variable depending on pain    Diagnostic tests MRI    Patient Stated Goals Return to full activity without pain. Get back to gardening and exercise without pain.    Currently in Pain? No/denies    Pain Score 0-No pain    Pain Onset More than a month ago                             [facility] Adult PT Treatment/Exercise - 05/02/20 0001      Ambulation/Gait   Ambulation/Gait Yes    Gait Pattern Decreased step length - left;Decreased step length - right;Decreased stride length;Decreased hip/knee flexion - right;Decreased hip/knee flexion - left;Trendelenburg;Decreased trunk rotation      Posture/Postural Control   Posture/Postural Control Postural limitations    Postural Limitations Decreased lumbar  lordosis;Increased thoracic kyphosis      Exercises   Exercises Lumbar;Knee/Hip      Lumbar Exercises: Stretches   Single Knee to Chest Stretch --    Single Knee to Chest Stretch Limitations --    Lower Trunk Rotation Limitations 10x 3s    Pelvic Tilt 20 reps    Pelvic Tilt Limitations 2s    Figure 4 Stretch 30 seconds;3 reps    Other Lumbar Stretch Exercise --      Lumbar Exercises: Standing   Functional Squats 20 reps    Functional Squats Limitations STS 2x10 with green band    Other Standing Lumbar Exercises paloff press green 20x each, banded green deadlift at mirror 2x10      Lumbar Exercises: Supine   Bridge with clamshell 20 reps    Bridge with Ball Squeeze Limitations red TB 2x10      Manual Therapy   Manual Therapy Joint mobilization;Soft tissue mobilization    Joint Mobilization Prone PA grade II-III L3-5    Soft tissue mobilization ischemic pressure- L lateral rotators, gluteals                  PT Education - 05/02/20 1442    Education Details exercise progression, HEP, muscle firing patterns, posture, POC    Person(s) Educated Patient    Methods Explanation;Demonstration;Handout    Comprehension Verbalized understanding;Returned demonstration            PT Short Term Goals - 04/16/20 1136      PT SHORT TERM GOAL #1   Title Pt will be independent with HEP to demonstrate synthesis of PT and compliance education.    Time 2    Period Weeks    Status New      PT SHORT TERM GOAL #2   Title Pt will demonstrate at least 60% pain free ROM in all directions in order to demonstrate functional improvement in L/S ROM.    Time 4    Period Weeks    Status New             PT Long Term Goals - 04/16/20 1137      PT LONG TERM GOAL #1   Title Pt will be able to demonstrate good lifitng mechanics with at least 25lbs in order to demonstrate return to simulated household duties.    Baseline 6    Period Weeks    Status New      PT LONG TERM GOAL #2    Title Pt will report ability to lift, squat, and walk without pain in order to demonstrate functional improvement in daily/community  mobility.    Time 8    Period Weeks    Status New                 Plan - 05/02/20 1444    Clinical Impression Statement Pt was able to progress functional core and LE strengthening at today's session with cuing for form and technique. Pt had dynamic valgus with STS as well as banded deadlift. STS knee valgus required banded external cue while deadlift required mirror for visual cuing. Pt still exhibits L/S hypomobility but has decreased muscular spasm on L side with STM. Pt to decrease frequency due to significant decrease in pain. Pt would benefit from continued skilled therapy in order to maximize functional mobility, prevent functional decline, and promote improvement towards full community mobility/exericse.    Personal Factors and Comorbidities Age;Time since onset of injury/illness/exacerbation    Examination-Activity Limitations Transfers;Bed Mobility;Lift;Squat;Bend;Carry;Stairs    Examination-Participation Restrictions Cleaning;Yard Work;Community Activity;Other    Stability/Clinical Decision Making Evolving/Moderate complexity    Rehab Potential Good    PT Frequency 2x / week    PT Duration 8 weeks    PT Treatment/Interventions ADLs/Self Care Home Management;Aquatic Therapy;Electrical Stimulation;Iontophoresis 4mg /ml Dexamethasone;Moist Heat;Traction;Ultrasound;Gait training;Stair training;Functional mobility training;Therapeutic activities;Therapeutic exercise;Balance training;Neuromuscular re-education;Patient/family education;Manual techniques;Passive range of motion;Dry needling;Taping;Joint Manipulations;Spinal Manipulations    PT Next Visit Plan Review HEP, box lift, banded DL, seated row on swissball    PT Home Exercise Plan Printed handout provided    Consulted and Agree with Plan of Care Patient           Patient will benefit from  skilled therapeutic intervention in order to improve the following deficits and impairments:  Decreased mobility,Difficulty walking,Increased muscle spasms,Abnormal gait,Decreased range of motion,Decreased strength,Pain,Postural dysfunction,Impaired flexibility,Decreased activity tolerance  Visit Diagnosis: Back pain of lumbar region with sciatica  Muscle weakness (generalized)  Difficulty walking     Problem List Patient Active Problem List   Diagnosis Date Noted  . 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 Hyponatremia 03/20/2020  . Hyperglycemia 02/20/2019  . Genetic testing 11/11/2018  . Family history of uterine cancer   . Family history of brain cancer   . Family history of melanoma   . GERD (gastroesophageal reflux disease) 02/14/2018  . Glaucoma 02/14/2018  . Family history of colon cancer 02/14/2018  . Encounter for well adult exam with abnormal findings 02/14/2018  . Osteoporosis 02/14/2018  . History of left breast cancer 01/13/2018  . S/P knee replacement 03/23/2016  . S/P shoulder replacement 08/02/2015  . Palpitations 08/20/2014  . Episode of hypertension 08/20/2014  . COPD, moderate ([place]) 05/09/2014  . Premature atrial contractions 11/08/2013  . Cardiac arrhythmia 05/10/2013  . Trochanteric bursitis of left hip 02/02/2012  . Osteoarthritis of left hip 06/25/2011  . Leg length inequality 06/25/2011  . Left ovarian cyst 01/20/2011  . Elevated lipids 11/04/2009  . Acute cystitis 11/04/2009  . LOW BACK PAIN SYNDROME 05/07/2008  . Bronchiectasis with acute exacerbation ([place]) 03/18/2007  . Hemoptysis 03/18/2007  . PNEUMONIA 03/07/2007  . Disorder of bone and cartilage 03/07/2007  . BRONCHIECTASIS 03/04/2007  . Osteoarthritis 03/04/2007    Daleen Bo PT, DPT 05/02/20 3:06 PM   [facility] [address] [place], [place], [address] Phone: [phone]   Fax:  [phone]  Name: KENADI MILTNER MRN:  [id number] Date of Birth: [birth year]

## 2020-05-07 ENCOUNTER — Encounter: Payer: Medicare PPO | Admitting: Physical Therapy

## 2020-05-09 ENCOUNTER — Encounter: Payer: Medicare PPO | Admitting: Physical Therapy

## 2020-05-14 ENCOUNTER — Encounter: Payer: Medicare PPO | Admitting: Physical Therapy

## 2020-05-16 ENCOUNTER — Encounter: Payer: Medicare PPO | Admitting: Physical Therapy

## 2020-05-17 ENCOUNTER — Other Ambulatory Visit: Payer: Self-pay

## 2020-05-17 MED ORDER — STIOLTO RESPIMAT 2.5-2.5 MCG/ACT IN AERS: 2.0000 | INHALATION_SPRAY | Freq: Every day | RESPIRATORY_TRACT | 7 refills | Status: DC

## 2020-05-20 ENCOUNTER — Ambulatory Visit: Payer: Medicare PPO | Admitting: Family Medicine

## 2020-05-23 ENCOUNTER — Encounter: Payer: Medicare PPO | Admitting: Physical Therapy

## 2020-05-24 ENCOUNTER — Ambulatory Visit: Payer: Medicare PPO | Admitting: Physical Therapy

## 2020-05-24 ENCOUNTER — Encounter: Payer: Self-pay | Admitting: Physical Therapy

## 2020-05-24 ENCOUNTER — Other Ambulatory Visit: Payer: Self-pay

## 2020-05-24 DIAGNOSIS — M5440 Lumbago with sciatica, unspecified side: Secondary | ICD-10-CM | POA: Diagnosis not present

## 2020-05-24 DIAGNOSIS — R262 Difficulty in walking, not elsewhere classified: Secondary | ICD-10-CM | POA: Diagnosis not present

## 2020-05-24 DIAGNOSIS — M6281 Muscle weakness (generalized): Secondary | ICD-10-CM

## 2020-05-24 NOTE — Patient Instructions (Signed)
Access Code: [email or web address] URL: [email or web address] Date: 05/24/2020 Prepared by: Daleen Bo  Exercises Seated Sciatic Nerve Glide - 1 x daily - 7 x weekly - 1 sets - 15 reps - 2 hold

## 2020-05-24 NOTE — Therapy (Addendum)
[facility] [address] [place], [place], [address] Phone: [phone]   Fax:  [phone]  Physical Therapy Discharge  Patient Details  Name: Miranda Thompson MRN: [id number] Date of Birth: [birth year] Referring Provider (PT): Dr. Georgina Snell   Encounter Date: 05/24/2020   PT End of Session - 05/24/20 1131    Visit Number 4    Number of Visits 17    Date for PT Re-Evaluation 05/16/20    Authorization Type [employer]    PT Start Time 1100    PT Stop Time 1124    PT Time Calculation (min) 24 min    Activity Tolerance Patient tolerated treatment well    Behavior During Therapy [facility] for tasks assessed/performed           Past Medical History:  Diagnosis Date  . Adenomatous colon polyp   . Allergic rhinitis   . Anemia    pt states no anemia in her past hx thst she is aware of   . Atrial fibrillation ([place])    goes in and out of this rhythm- takes Tenormin  . Breast cancer, left breast ([place]) 1994 with lumpectomy   chemo,tamox,radiation  . Bronchiectasis with acute exacerbation ([place])   . COPD (chronic obstructive pulmonary disease) ([place])   . Diverticulosis of colon   . DJD (degenerative joint disease)   . Dysrhythmia    palpitations  . Family history of brain cancer   . Family history of colon cancer 02/14/2018  . Family history of colon cancer   . Family history of malignant neoplasm of gastrointestinal tract   . Family history of melanoma   . Family history of uterine cancer   . Fibromyalgia   . GERD (gastroesophageal reflux disease) 02/14/2018  . Glaucoma   . Hemoptysis   . History of pneumonia   . Hypercholesteremia    pt denies  . Hypertension   . Low back pain syndrome   . Neuromuscular disorder (HCC)    hx fibromyalgia   . Osteopenia   . Pneumonia    hx  . Pseudomonas infection   . Rotator cuff syndrome of right shoulder   . Spinal stenosis     Past Surgical History:  Procedure Laterality Date  .  CATARACT EXTRACTION Right   . COLONOSCOPY    . left breast lumpectomy and LN dissection  06/1992   Dr. [name]  . LUNG BIOPSY  1968   TSurg---Dr. [name]  . LUNG BIOPSY  2012   dr [name]  . POLYPECTOMY    . REVERSE SHOULDER ARTHROPLASTY Right 08/02/2015   Procedure: RIGHT REVERSE TOTAL SHOULDER ARTHROPLASTY;  Surgeon: [name], MD;  Location: [facility];  Service: Orthopedics;  Laterality: Right;  . REVERSE TOTAL SHOULDER ARTHROPLASTY Right 08/02/2015  . TONSILLECTOMY    . TOTAL KNEE ARTHROPLASTY Right 01/21/2015   Procedure: TOTAL RIGHT KNEE ARTHROPLASTY;  Surgeon: [name], MD;  [facility];  Service: Orthopedics;  Laterality: Right;  . TOTAL KNEE ARTHROPLASTY Left 03/23/2016   Procedure: LEFT TOTAL KNEE ARTHROPLASTY;  Surgeon: [name], MD;  Location[facility];  Service: Orthopedics;  Laterality: Left;    There were no vitals filed for this visit.   Subjective Assessment - 05/24/20 1128    Subjective Pt states she has no back pain and has had very few moments of pain. She feels that is about 95% better and thinks today should be the last day for therapy. She states the leg heaviness is gone. She  only feels the legs get heavy when she is physically tired as compared to before.    Limitations Sitting;Lifting;Walking;Standing    How long can you walk comfortably? Variable depending on pain    Diagnostic tests MRI    Patient Stated Goals Return to full activity without pain. Get back to gardening and exercise without pain.    Currently in Pain? No/denies    Pain Score 0-No pain    Pain Onset More than a month ago              [facility] PT Assessment - 05/24/20 0001      Assessment   Medical Diagnosis Low back pain with sciatica    Referring Provider (PT) Dr. 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   Overall Cognitive Status Within Functional Limits for tasks assessed      Observation/Other Assessments   Other Surveys  Oswestry Disability Index    Oswestry Disability Index  10% impairment      Functional Tests   Functional tests Sit to Stand      Sit to Stand   Comments no UE support required, minimal genu valgus      Posture/Postural Control   Posture/Postural Control Postural limitations    Postural Limitations Decreased lumbar lordosis;Increased thoracic kyphosis      AROM   Lumbar Flexion WFL    Lumbar Extension 80%    Lumbar - Right Side Bend 75%    Lumbar - Left Side Bend 75%    Lumbar - Right Rotation WFL    Lumbar - Left Rotation WFL      Strength   Overall Strength Deficits    Overall Strength Comments 4/5 hips in all directions; bilat knee flexion and extension 4+/5      Flexibility   Soft Tissue Assessment /Muscle Length yes    Hamstrings min limitation in 90/90    Piriformis --      Palpation   Spinal mobility --    Palpation comment --      Special Tests    Special Tests Lumbar;Sacrolliac Tests    Lumbar Tests FABER test;Straight Leg Raise    Sacroiliac Tests  Sacral Compression      FABER test   findings Negative    Side LEft      Straight Leg Raise   Findings Positive    Side  Left    Comment improved with sciatic n. glide exercise      Sacral Compression   Findings Negative    Side  Left      Ambulation/Gait   Ambulation/Gait Yes    Gait Pattern Decreased step length - left;Decreased step length - right;Decreased hip/knee flexion - right;Decreased hip/knee flexion - left;Trendelenburg      Balance   Balance Assessed Yes      Standardized Balance Assessment   Standardized Balance Assessment Five Times Sit to Stand    Five times sit to stand comments  11.9                         [facility] Adult PT Treatment/Exercise - 05/24/20  0001      Lumbar  Exercises: Stretches   Other Lumbar Stretch Exercise sciatic n. glide 15x 3s hold                  PT Education - 05/24/20 1130    Education Details self exercise progression, HEP, muscle firing patterns, posture, POC, testing results, D/C plan    Person(s) Educated Patient    Methods Demonstration;Explanation;Handout    Comprehension Verbalized understanding;Returned demonstration            PT Short Term Goals - 05/24/20 1140      PT SHORT TERM GOAL #1   Title Pt will be independent with HEP to demonstrate synthesis of PT and compliance education.    Time 2    Period Weeks    Status Achieved      PT SHORT TERM GOAL #2   Title Pt will demonstrate at least 60% pain free ROM in all directions in order to demonstrate functional improvement in L/S ROM.    Time 4    Period Weeks    Status Achieved             PT Long Term Goals - 05/24/20 1140      PT LONG TERM GOAL #1   Title Pt will be able to demonstrate good lifitng mechanics with at least 25lbs in order to demonstrate return to simulated household duties.    Baseline 6    Period Weeks    Status Partially Met      PT LONG TERM GOAL #2   Title Pt will report ability to lift, squat, and walk without pain in order to demonstrate functional improvement in daily/community mobility.    Time 8    Period Weeks    Status Achieved                 Plan - 05/24/20 1132    Clinical Impression Statement Pt demonstrates significant improvement with transfers, LE strength, and L/S ROM. Pt made aware of remaining minimal deficits and edu regarding self progression of exercise self management. Pt gave verbal understanding to edu and wishes to be D/C from skilled therapy. Pt with good understanding of HEP and technique for home. Pt has met most/all goals for therapy at this time. D/C this episode of care.    Personal Factors and Comorbidities Age;Time since onset of injury/illness/exacerbation    Examination-Activity  Limitations Transfers;Bed Mobility;Lift;Squat;Bend;Carry;Stairs    Examination-Participation Restrictions Cleaning;Yard Work;Community Activity;Other    Stability/Clinical Decision Making Evolving/Moderate complexity    Rehab Potential Good    PT Frequency 2x / week    PT Duration 8 weeks    PT Treatment/Interventions ADLs/Self Care Home Management;Aquatic Therapy;Electrical Stimulation;Iontophoresis 84m/ml Dexamethasone;Moist Heat;Traction;Ultrasound;Gait training;Stair training;Functional mobility training;Therapeutic activities;Therapeutic exercise;Balance training;Neuromuscular re-education;Patient/family education;Manual techniques;Passive range of motion;Dry needling;Taping;Joint Manipulations;Spinal Manipulations    PT Next Visit Plan Review HEP, box lift, banded DL, seated row on swissball    PT Home Exercise Plan Printed handout provided    Consulted and Agree with Plan of Care Patient           Patient will benefit from skilled therapeutic intervention in order to improve the following deficits and impairments:  Decreased mobility,Difficulty walking,Increased muscle spasms,Abnormal gait,Decreased range of motion,Decreased strength,Pain,Postural dysfunction,Impaired flexibility,Decreased activity tolerance  Visit Diagnosis: Back pain of lumbar region with sciatica  Difficulty walking  Muscle weakness (generalized)     Problem List Patient Active Problem List   Diagnosis Date Noted  . Submandibular  gland mass 03/20/2020  . Hyponatremia 03/20/2020  . Hyperglycemia 02/20/2019  . Genetic testing 11/11/2018  . Family history of uterine cancer   . Family history of brain cancer   . Family history of melanoma   . GERD (gastroesophageal reflux disease) 02/14/2018  . Glaucoma 02/14/2018  . Family history of colon cancer 02/14/2018  . Encounter for well adult exam with abnormal findings 02/14/2018  . Osteoporosis 02/14/2018  . History of left breast cancer 01/13/2018  . S/P  knee replacement 03/23/2016  . S/P shoulder replacement 08/02/2015  . Palpitations 08/20/2014  . Episode of hypertension 08/20/2014  . COPD, moderate ([place]) 05/09/2014  . Premature atrial contractions 11/08/2013  . Cardiac arrhythmia 05/10/2013  . Trochanteric bursitis of left hip 02/02/2012  . Osteoarthritis of left hip 06/25/2011  . Leg length inequality 06/25/2011  . Left ovarian cyst 01/20/2011  . Elevated lipids 11/04/2009  . Acute cystitis 11/04/2009  . LOW BACK PAIN SYNDROME 05/07/2008  . Bronchiectasis with acute exacerbation ([place]) 03/18/2007  . Hemoptysis 03/18/2007  . PNEUMONIA 03/07/2007  . Disorder of bone and cartilage 03/07/2007  . BRONCHIECTASIS 03/04/2007  . Osteoarthritis 03/04/2007   Daleen Bo PT, DPT 05/24/20 11:45 AM   [facility] [address] [place], [place], [address] Phone: [phone]   Fax:  [phone]  Name: Miranda Thompson MRN: [id number] Date of Birth: 12-10-1933  PHYSICAL THERAPY DISCHARGE SUMMARY  Visits from Start of Care: 4   Plan: Patient agrees to discharge.  Patient goals were partially met. Patient is being discharged due to being pleased with the current functional level.  ?????       Pt is pleased with current functional level.

## 2020-07-08 ENCOUNTER — Encounter: Payer: Self-pay | Admitting: Family Medicine

## 2020-07-10 ENCOUNTER — Ambulatory Visit: Payer: Medicare PPO | Admitting: Family Medicine

## 2020-07-17 DIAGNOSIS — H2512 Age-related nuclear cataract, left eye: Secondary | ICD-10-CM | POA: Diagnosis not present

## 2020-07-17 DIAGNOSIS — H401131 Primary open-angle glaucoma, bilateral, mild stage: Secondary | ICD-10-CM | POA: Diagnosis not present

## 2020-07-17 DIAGNOSIS — H43813 Vitreous degeneration, bilateral: Secondary | ICD-10-CM | POA: Diagnosis not present

## 2020-07-17 DIAGNOSIS — H04123 Dry eye syndrome of bilateral lacrimal glands: Secondary | ICD-10-CM | POA: Diagnosis not present

## 2020-07-17 DIAGNOSIS — Z961 Presence of intraocular lens: Secondary | ICD-10-CM | POA: Diagnosis not present

## 2020-07-17 DIAGNOSIS — H35372 Puckering of macula, left eye: Secondary | ICD-10-CM | POA: Diagnosis not present

## 2020-08-22 NOTE — Progress Notes (Signed)
Cardiology Office Note   Date:  08/27/2020   ID:  Kavita, Bartl 1933-09-27, MRN [id number]  PCP:  [name], MD  Cardiologist:   [name], MD   Chief Complaint  Patient presents with   Follow-up    History of Present Illness: Miranda Thompson is a 87 y.o. female who presents for follow up seen for [name].   Miranda Thompson was initially seen by [name] for the evaluation of palpitations in 2015 at which time these were felt to mostly be atrial arrhythmias due to lung disease and bronchiectasis which Miranda Thompson is followed by Dr. Valeta Harms. Miranda Thompson was started on atenolol however eventually complained of fatigue and was transitioned to diltiazem.  Miranda Thompson wore a Holter monitor which showed PACs/PVCs.  Miranda Thompson underwent an echocardiogram 09/27/2018 with LVEF at 60 to 65% with no valvular disease and no pulmonary hypertension.  Chest CT 09/14/2018 with some progression of ILD/bronchiectasis   Miranda Thompson was last seen by [name] 11/23/2018 in follow-up and seemed to be doing fairly well from a CV standpoint.  No changes were made.   One pulmonary follow up with Dr. Valeta Harms 12/25/2019 Miranda Thompson had complaints of Miranda Thompson legs feeling very "heavy".  Plan was to obtain ankle-brachial index with PCP follow-up. Lower extremity vascular study completed 01/10/2020 which showed normal ABIs.    Last seen by myself 02/02/2020 at which time Miranda Thompson continued to walk approximately 1.5 miles per day 4 days/week and would ride the stationary bike on other days. Miranda Thompson continued to have leg heaviness with new lower extremity leg pain. Given previous normal Doppler studies, we discussed symptoms could be related to spine/nerve compression osteoporosis or degenerative disease.  Chest CT performed per pulmonary shows moderate thoracic spondylosis.  We discussed these findings.     Today Miranda Thompson reports that Miranda Thompson had an MRI performed which showed some spinal stenosis which can explain Miranda Thompson leg pain and heaviness. Miranda Thompson has been participating with PT   which has helped. Miranda Thompson states that Miranda Thompson palpitations have increased and occur just about on a daily basis. Episodes are short in duration lasting only 1 min and Miranda Thompson has no associated SOB or chest pain. States that Miranda Thompson 5 children are putting together a party for Miranda Thompson and Miranda Thompson husbands 65th anniversary.   Past Medical History:  Diagnosis Date   Adenomatous colon polyp    Allergic rhinitis    Anemia    pt states no anemia in Miranda Thompson past hx thst Miranda Thompson is aware of    Atrial fibrillation ([place])    goes in and out of this rhythm- takes Tenormin   Breast cancer, left breast ([place]) 1994 with lumpectomy   chemo,tamox,radiation   Bronchiectasis with acute exacerbation (HCC)    COPD (chronic obstructive pulmonary disease) (HCC)    Diverticulosis of colon    DJD (degenerative joint disease)    Dysrhythmia    palpitations   Family history of brain cancer    Family history of colon cancer 02/14/2018   Family history of colon cancer    Family history of malignant neoplasm of gastrointestinal tract    Family history of melanoma    Family history of uterine cancer    Fibromyalgia    GERD (gastroesophageal reflux disease) 02/14/2018   Glaucoma    Hemoptysis    History of pneumonia    Hypercholesteremia    pt denies   Hypertension    Low back pain syndrome    Neuromuscular disorder ([place])  hx fibromyalgia    Osteopenia    Pneumonia    hx   Pseudomonas infection    Rotator cuff syndrome of right shoulder    Spinal stenosis     Past Surgical History:  Procedure Laterality Date   CATARACT EXTRACTION Right    COLONOSCOPY     left breast lumpectomy and LN dissection  06/1992   Dr. [name]   LUNG BIOPSY  1968   TSurg---Dr. [name]   LUNG BIOPSY  2012   dr [name]   POLYPECTOMY     REVERSE SHOULDER ARTHROPLASTY Right 08/02/2015   Procedure: RIGHT REVERSE TOTAL SHOULDER ARTHROPLASTY;  Surgeon: [name], MD;  Location: [facility];  Service: Orthopedics;  Laterality: Right;   REVERSE TOTAL SHOULDER  ARTHROPLASTY Right 08/02/2015   TONSILLECTOMY     TOTAL KNEE ARTHROPLASTY Right 01/21/2015   Procedure: TOTAL RIGHT KNEE ARTHROPLASTY;  Surgeon: [name], MD;  Location: [facility];  Service: Orthopedics;  Laterality: Right;   TOTAL KNEE ARTHROPLASTY Left 03/23/2016   Procedure: LEFT TOTAL KNEE ARTHROPLASTY;  Surgeon: [name], MD;  Location: [facility];  Service: Orthopedics;  Laterality: Left;     Current Outpatient Medications  Medication Sig Dispense Refill   amoxicillin (AMOXIL) 500 MG capsule Take prior to dental procedures     ASPIRIN 81 PO Take 1 tablet by mouth daily.     Calcium Carb-Cholecalciferol 600-800 MG-UNIT TABS Take 1 tablet by mouth daily.     diltiazem (CARDIZEM CD) 120 MG 24 hr capsule TAKE 1 CAPSULE BY MOUTH EVERY DAY 90 capsule 2   LUMIGAN 0.01 % SOLN      Multiple Vitamins-Minerals (MULTIVITAMIN & MINERAL PO) Take 1 tablet by mouth daily.     Nutritional Supplements (VITAMIN D BOOSTER PO) Take by mouth.     Respiratory Therapy Supplies (FLUTTER) DEVI 1 application by Does not apply route 3 (three) times daily. 1 each 0   Saccharomyces boulardii (FLORASTOR PO) Take 1 capsule by mouth daily.     Tiotropium Bromide-Olodaterol (STIOLTO RESPIMAT) 2.5-2.5 MCG/ACT AERS Inhale 2 puffs into the lungs daily. 4 g 7   No current facility-administered medications for this visit.    Allergies:   Patient has no known allergies.    Social History:  The patient  reports that Miranda Thompson quit smoking about 53 years ago. Miranda Thompson smoking use included cigarettes. Miranda Thompson has a 10.00 pack-year smoking history. Miranda Thompson has never used smokeless tobacco. Miranda Thompson reports current alcohol use of about 1.0 standard drink of alcohol per week. Miranda Thompson reports that Miranda Thompson does not use drugs.   Family History:  The patient's family history includes Brain cancer in Miranda Thompson maternal uncle; Colon cancer (age of onset: 67) in Miranda Thompson father; Colon polyps in Miranda Thompson daughter; Heart attack in Miranda Thompson mother; Hypertension in Miranda Thompson brother; Melanoma  (age of onset: 63) in Miranda Thompson niece; Melanoma (age of onset: 38) in Miranda Thompson daughter; Other (age of onset: 10) in Miranda Thompson paternal uncle; Stroke in Miranda Thompson maternal grandmother; Uterine cancer (age of onset: 69) in Miranda Thompson daughter.    ROS:  Please see the history of present illness.  Otherwise, review of systems are positive for none. All other systems are reviewed and negative.    PHYSICAL EXAM: VS:  BP (!) 118/58   Pulse 82   Ht 5\' 3"  (1.6 m)   Wt 134 lb 9.6 oz (61.1 kg)   LMP 02/23/1985   SpO2 96%   BMI 23.84 kg/m  , BMI Body mass index is 23.84 kg/m.  General:  Well developed, well nourished, NAD Skin: Warm, dry, intact  Neck: Negative for carotid bruits. No JVD Lungs:Clear to ausculation bilaterally. No wheezes, rales, or rhonchi. Breathing is unlabored. Cardiovascular: RRR with S1 S2. No murmurs Extremities: No edema. Neuro: Alert and oriented. No focal deficits. No facial asymmetry. MAE spontaneously. Psych: Responds to questions appropriately with normal affect.     EKG:  EKG is not ordered today.   Recent Labs: 03/08/2020: ALT 15; BUN 16; Creatinine, Ser 0.64; Hemoglobin 12.6; Platelets 244.0; Potassium 4.4; Sodium 129; TSH 4.06    Lipid Panel    Component Value Date/Time   CHOL 161 03/08/2020 0858   TRIG 45.0 03/08/2020 0858   HDL 66.30 03/08/2020 0858   CHOLHDL 2 03/08/2020 0858   VLDL 9.0 03/08/2020 0858   LDLCALC 85 03/08/2020 0858     Wt Readings from Last 3 Encounters:  08/27/20 134 lb 9.6 oz (61.1 kg)  04/08/20 134 lb 9.6 oz (61.1 kg)  03/14/20 134 lb (60.8 kg)    Other studies Reviewed: Additional studies/ records that were reviewed today include: . Review of the above records demonstrates:   Lower extremity doppler studies 01/10/20:   Right: Resting right ankle-brachial index is within normal range. No  evidence of significant right lower extremity arterial disease. The right  toe-brachial index is normal.   Left: Resting left ankle-brachial index is within  normal range. No  evidence of significant left lower extremity arterial disease. The left  toe-brachial index is normal.  ASSESSMENT AND PLAN:  1.  Arrhythmia: -Felt to be secondary to ILD previously treated with atenolol and currently on diltiazem -Previous monitor showed PVCs/PACs -Will increase diltiazem to 180mg  QD and reduce losartan to 12.5mg  to allow for adequate BP -Due to poor access, Miranda Thompson will Mychart message me with how Miranda Thompson is feeling and BP readings next week. May be able to d/c losartan altogether if BP stable  -Consider ZIO for full assessment if no improvement with titration    2.  Aortic atherosclerosis: -Per chest CT -Denies anginal symptoms -Continue ASA   3.  HTN: -Stable, 118/58 -Increase diltiazem to 180mg  QD and reduce losartan to 12.5mg  QD -Plan as above    4.  Bronchiectasis: -Followed by Dr. Valeta Harms with pulmonary medicine -Last seen 12/25/2019   5.  Leg heaviness: -Improved, found to have spinal stenosis on MRI>>>followed with PT>>improved   Current medicines are reviewed at length with the patient today.  The patient does not have concerns regarding medicines.  The following changes have been made:  reduce losartan to 12.5 and increase diltiazem to 180mg    Labs/ tests ordered today include: None No orders of the defined types were placed in this encounter.  Disposition:   FU with APP in 1 month  Signed, Kathyrn Drown, NP  08/27/2020 2:42 PM    [facility], [place], [place]  [address] Phone: [phone]; Fax: [phone]

## 2020-08-27 ENCOUNTER — Ambulatory Visit (INDEPENDENT_AMBULATORY_CARE_PROVIDER_SITE_OTHER): Payer: Medicare PPO | Admitting: Cardiology

## 2020-08-27 ENCOUNTER — Encounter: Payer: Self-pay | Admitting: Cardiology

## 2020-08-27 ENCOUNTER — Ambulatory Visit: Payer: Medicare PPO | Admitting: Cardiology

## 2020-08-27 ENCOUNTER — Other Ambulatory Visit: Payer: Self-pay

## 2020-08-27 VITALS — BP 118/58 | HR 82 | Ht 63.0 in | Wt 134.6 lb

## 2020-08-27 DIAGNOSIS — I700 Atherosclerosis of aorta: Secondary | ICD-10-CM | POA: Diagnosis not present

## 2020-08-27 DIAGNOSIS — I10 Essential (primary) hypertension: Secondary | ICD-10-CM

## 2020-08-27 DIAGNOSIS — I491 Atrial premature depolarization: Secondary | ICD-10-CM | POA: Diagnosis not present

## 2020-08-27 DIAGNOSIS — I498 Other specified cardiac arrhythmias: Secondary | ICD-10-CM | POA: Diagnosis not present

## 2020-08-27 MED ORDER — DILTIAZEM HCL ER COATED BEADS 180 MG PO CP24: 180.0000 mg | ORAL_CAPSULE | Freq: Every day | ORAL | 2 refills | Status: DC

## 2020-08-27 MED ORDER — LOSARTAN POTASSIUM 25 MG PO TABS: 12.5000 mg | ORAL_TABLET | Freq: Every day | ORAL | 3 refills | Status: DC

## 2020-08-27 NOTE — Patient Instructions (Addendum)
Medication Instructions:    START TAKING DILTIAZEM 180 MG ONCE A DAY     START TAKING  LOSARTAN 12.5 MG ONCE A DAY    *If you need a refill on your cardiac medications before your next appointment, please call your pharmacy*   Lab Work:  NONE ORDERED  TODAY   If you have labs (blood work) drawn today and your tests are completely normal, you will receive your results only by: [place] (if you have MyChart) OR A paper copy in the mail If you have any lab test that is abnormal or we need to change your treatment, we will call you to review the results.   Testing/Procedures: NONE ORDERED  TODAY    Follow-Up: At [facility], you and your health needs are our priority.  As part of our continuing mission to provide you with exceptional heart care, we have created designated Provider Care Teams.  These Care Teams include your primary Cardiologist (physician) and Advanced Practice Providers (APPs -  Physician Assistants and Nurse Practitioners) who all work together to provide you with the care you need, when you need it.  We recommend signing up for the patient portal called "MyChart".  Sign up information is provided on this After Visit Summary.  MyChart is used to connect with patients for Virtual Visits (Telemedicine).  Patients are able to view lab/test results, encounter notes, upcoming appointments, etc.  Non-urgent messages can be sent to your provider as well.   To learn more about what you can do with MyChart, go to [email or web address].    Your next appointment:  NEXT AVAILABLE OPENING   The format for your next appointment:   In Person  Provider:   You may see Jenkins Rouge, MD or one of the following Advanced Practice Providers on your designated Care Team:   Kathyrn Drown, NP OR ANY AVAILABLE APP    Other Instructions

## 2020-09-11 ENCOUNTER — Ambulatory Visit (INDEPENDENT_AMBULATORY_CARE_PROVIDER_SITE_OTHER): Payer: Medicare PPO | Admitting: Internal Medicine

## 2020-09-11 ENCOUNTER — Other Ambulatory Visit: Payer: Self-pay

## 2020-09-11 VITALS — BP 120/60 | HR 95 | Temp 97.9°F | Ht 63.0 in | Wt 134.0 lb

## 2020-09-11 DIAGNOSIS — I10 Essential (primary) hypertension: Secondary | ICD-10-CM | POA: Diagnosis not present

## 2020-09-11 DIAGNOSIS — R739 Hyperglycemia, unspecified: Secondary | ICD-10-CM | POA: Diagnosis not present

## 2020-09-11 DIAGNOSIS — M5442 Lumbago with sciatica, left side: Secondary | ICD-10-CM | POA: Diagnosis not present

## 2020-09-11 DIAGNOSIS — I700 Atherosclerosis of aorta: Secondary | ICD-10-CM | POA: Diagnosis not present

## 2020-09-11 MED ORDER — PREDNISONE 10 MG PO TABS: 10.0000 mg | ORAL_TABLET | Freq: Every day | ORAL | 0 refills | Status: AC

## 2020-09-11 NOTE — Patient Instructions (Addendum)
Please take all new medication as prescribed - the prednisone  Please continue all other medications as before, and refills have been done if requested.  Please have the pharmacy call with any other refills you may need.  Please continue your efforts at being more active, low cholesterol diet, and weight control.  Please keep your appointments with your specialists as you may have planned  Please make an Appointment to return in 6 months, or sooner if needed

## 2020-09-11 NOTE — Progress Notes (Signed)
Patient ID: Miranda Thompson, female   DOB: 1933-07-16, 87 y.o.   MRN: [id number]        Chief Complaint: follow up HTN, HLD and hyperglycemia, low back pain       HPI:  Miranda Thompson is a 87 y.o. female here with c/o 2 wks onset worsening flare of low back pain, now moderate midline with repeated flare of pain to the left hip and leg to just below the knee; but without left leg weakness or falls.  Pt describes this as "heavy legs"  .  Last MRI ls spine feb 2022 is c/w spondylosis.  No fever, trauma.  Pt denies chest pain, increased sob or doe, wheezing, orthopnea, PND, increased LE swelling, palpitations, dizziness or syncope.   Pt denies polydipsia, polyuria, or new focal neuro s/s.  Otherwise good med compliance.        Wt Readings from Last 3 Encounters:  09/11/20 134 lb (60.8 kg)  08/27/20 134 lb 9.6 oz (61.1 kg)  04/08/20 134 lb 9.6 oz (61.1 kg)   BP Readings from Last 3 Encounters:  09/11/20 120/60  08/27/20 (!) 118/58  04/16/20 (!) 121/52         Past Medical History:  Diagnosis Date   Adenomatous colon polyp    Allergic rhinitis    Anemia    pt states no anemia in her past hx thst she is aware of    Atrial fibrillation ([place])    goes in and out of this rhythm- takes Tenormin   Breast cancer, left breast ([place]) 1994 with lumpectomy   chemo,tamox,radiation   Bronchiectasis with acute exacerbation ([place])    COPD (chronic obstructive pulmonary disease) ([place])    Diverticulosis of colon    DJD (degenerative joint disease)    Dysrhythmia    palpitations   Family history of brain cancer    Family history of colon cancer 02/14/2018   Family history of colon cancer    Family history of malignant neoplasm of gastrointestinal tract    Family history of melanoma    Family history of uterine cancer    Fibromyalgia    GERD (gastroesophageal reflux disease) 02/14/2018   Glaucoma    Hemoptysis    History of pneumonia    Hypercholesteremia    pt denies   Hypertension    Low back  pain syndrome    Neuromuscular disorder (HCC)    hx fibromyalgia    Osteopenia    Pneumonia    hx   Pseudomonas infection    Rotator cuff syndrome of right shoulder    Spinal stenosis    Past Surgical History:  Procedure Laterality Date   CATARACT EXTRACTION Right    COLONOSCOPY     left breast lumpectomy and LN dissection  06/1992   Dr. [name]   LUNG BIOPSY  1968   TSurg---Dr. [name]   LUNG BIOPSY  2012   dr [name]   POLYPECTOMY     REVERSE SHOULDER ARTHROPLASTY Right 08/02/2015   Procedure: RIGHT REVERSE TOTAL SHOULDER ARTHROPLASTY;  Surgeon: [name], MD;  Location: [facility];  Service: Orthopedics;  Laterality: Right;   REVERSE TOTAL SHOULDER ARTHROPLASTY Right 08/02/2015   TONSILLECTOMY     TOTAL KNEE ARTHROPLASTY Right 01/21/2015   Procedure: TOTAL RIGHT KNEE ARTHROPLASTY;  Surgeon: [name], MD;  [facility];  Service: Orthopedics;  Laterality: Right;   TOTAL KNEE ARTHROPLASTY Left 03/23/2016   Procedure: LEFT TOTAL KNEE ARTHROPLASTY;  Surgeon: [name], MD;  Location:  [facility];  Service: Orthopedics;  Laterality: Left;    reports that she quit smoking about 53 years ago. Her smoking use included cigarettes. She has a 10.00 pack-year smoking history. She has never used smokeless tobacco. She reports current alcohol use of about 1.0 standard drink of alcohol per week. She reports that she does not use drugs. family history includes Brain cancer in her maternal uncle; Colon cancer (age of onset: 54) in her father; Colon polyps in her daughter; Heart attack in her mother; Hypertension in her brother; Melanoma (age of onset: 55) in her niece; Melanoma (age of onset: 87) in her daughter; Other (age of onset: 24) in her paternal uncle; Stroke in her maternal grandmother; Uterine cancer (age of onset: 2) in her daughter. No Known Allergies Current Outpatient Medications on File Prior to Visit  Medication Sig Dispense Refill   amoxicillin (AMOXIL) 500 MG capsule Take prior  to dental procedures     ASPIRIN 81 PO Take 1 tablet by mouth daily.     Calcium Carb-Cholecalciferol 600-800 MG-UNIT TABS Take 1 tablet by mouth daily.     diltiazem (CARDIZEM CD) 180 MG 24 hr capsule Take 1 capsule (180 mg total) by mouth daily. 90 capsule 2   losartan (COZAAR) 25 MG tablet Take 0.5 tablets (12.5 mg total) by mouth daily. 45 tablet 3   LUMIGAN 0.01 % SOLN      Multiple Vitamins-Minerals (MULTIVITAMIN & MINERAL PO) Take 1 tablet by mouth daily.     Nutritional Supplements (VITAMIN D BOOSTER PO) Take by mouth.     Respiratory Therapy Supplies (FLUTTER) DEVI 1 application by Does not apply route 3 (three) times daily. 1 each 0   Saccharomyces boulardii (FLORASTOR PO) Take 1 capsule by mouth daily.     Tiotropium Bromide-Olodaterol (STIOLTO RESPIMAT) 2.5-2.5 MCG/ACT AERS Inhale 2 puffs into the lungs daily. 4 g 7   Zinc 30 MG CAPS      No current facility-administered medications on file prior to visit.        ROS:  All others reviewed and negative.  Objective        PE:  BP 120/60 (BP Location: Right Arm, Patient Position: Sitting, Cuff Size: Normal)   Pulse 95   Temp 97.9 F (36.6 C) (Oral)   Ht 5\' 3"  (1.6 m)   Wt 134 lb (60.8 kg)   LMP 02/23/1985   SpO2 96%   BMI 23.74 kg/m                 Constitutional: Pt appears in NAD               HENT: Head: NCAT.                Right Ear: External ear normal.                 Left Ear: External ear normal.                Eyes: . Pupils are equal, round, and reactive to light. Conjunctivae and EOM are normal               Nose: without d/c or deformity               Neck: Neck supple. Gross normal ROM               Cardiovascular: Normal rate and regular rhythm.  Pulmonary/Chest: Effort normal and breath sounds without rales or wheezing.                Abd:  Soft, NT, ND, + BS, no organomegaly               Neurological: Pt is alert. At baseline orientation, motor grossly intact               Skin: Skin  is warm. No rashes, no other new lesions, LE edema - none               Psychiatric: Pt behavior is normal without agitation   Micro: none  Cardiac tracings I have personally interpreted today:  none  Pertinent Radiological findings (summarize): Mar 31 2020 MRI LS spine IMPRESSION: 1. Diffuse lumbar spine spondylosis as described above. 2.  No acute osseous injury of the lumbar spine.     Lab Results  Component Value Date   WBC 4.3 03/08/2020   HGB 12.6 03/08/2020   HCT 36.2 03/08/2020   PLT 244.0 03/08/2020   GLUCOSE 84 03/08/2020   CHOL 161 03/08/2020   TRIG 45.0 03/08/2020   HDL 66.30 03/08/2020   LDLCALC 85 03/08/2020   ALT 15 03/08/2020   AST 24 03/08/2020   NA 129 (L) 03/08/2020   K 4.4 03/08/2020   CL 96 03/08/2020   CREATININE 0.64 03/08/2020   BUN 16 03/08/2020   CO2 27 03/08/2020   TSH 4.06 03/08/2020   INR 1.06 01/14/2015   HGBA1C 5.9 03/08/2020   Assessment/Plan:  Miranda Thompson is a 87 y.o. White or Caucasian [1] female with  has a past medical history of Adenomatous colon polyp, Allergic rhinitis, Anemia, Atrial fibrillation ([place]), Breast cancer, left breast ([place]) (1994 with lumpectomy), Bronchiectasis with acute exacerbation ([place]), COPD (chronic obstructive pulmonary disease) ([place]), Diverticulosis of colon, DJD (degenerative joint disease), Dysrhythmia, Family history of brain cancer, Family history of colon cancer (02/14/2018), Family history of colon cancer, Family history of malignant neoplasm of gastrointestinal tract, Family history of melanoma, Family history of uterine cancer, Fibromyalgia, GERD (gastroesophageal reflux disease) (02/14/2018), Glaucoma, Hemoptysis, History of pneumonia, Hypercholesteremia, Hypertension, Low back pain syndrome, Neuromuscular disorder ([place]), Osteopenia, Pneumonia, Pseudomonas infection, Rotator cuff syndrome of right shoulder, and Spinal stenosis.  Low back pain With pain flare, for predpac asd, pain control, and f/u any  worsening s/s such as with sports medicine  Hyperglycemia Lab Results  Component Value Date   HGBA1C 5.9 03/08/2020   Stable, pt to continue current medical treatment  - diet   Episode of hypertension BP Readings from Last 3 Encounters:  09/11/20 120/60  08/27/20 (!) 118/58  04/16/20 (!) 121/52   Stable, pt to continue medical treatment  - losartan cardizem   Aortic atherosclerosis ([place]) Pt to continue low chol diet, exercise and declines statin  Followup: Return in about 6 months (around 03/14/2021).  Cathlean Cower, MD 09/15/2020 4:16 PM [facility]

## 2020-09-15 ENCOUNTER — Encounter: Payer: Self-pay | Admitting: Internal Medicine

## 2020-09-15 DIAGNOSIS — M545 Low back pain, unspecified: Secondary | ICD-10-CM | POA: Insufficient documentation

## 2020-09-15 NOTE — Assessment & Plan Note (Signed)
BP Readings from Last 3 Encounters:  09/11/20 120/60  08/27/20 (!) 118/58  04/16/20 (!) 121/52   Stable, pt to continue medical treatment  - losartan cardizem

## 2020-09-15 NOTE — Assessment & Plan Note (Signed)
Lab Results  Component Value Date   HGBA1C 5.9 03/08/2020   Stable, pt to continue current medical treatment  - diet

## 2020-09-15 NOTE — Assessment & Plan Note (Signed)
With pain flare, for predpac asd, pain control, and f/u any worsening s/s such as with sports medicine

## 2020-09-15 NOTE — Assessment & Plan Note (Signed)
Pt to continue low chol diet, exercise and declines statin

## 2020-09-27 NOTE — Progress Notes (Signed)
Cardiology Office Note   Date:  10/04/2020   ID:  Ardele, Lesperance 1933-08-13, MRN [id number]  PCP:  [name], MD  Cardiologist:   [name], MD   No chief complaint on file.   History of Present Illness:  87 y.o. f/u atrial arrhythmias mostly from her lung disease/ bronchiectasis. She has more fatigue with atenolol and has been maintained on cardizem Holter 2020 only PAC/PVC no PAF. TTE 09/27/18 EF 60-65% no valvular disease or pulmonary HTN.  She has had chronic back pain and heavy legs with recent pred pac and normal ABI's 12/25/19 MRI showing spinal stenosis She is still able to walk 2 miles/day She celebrated her 45 th wedding anniversary BP home normal and she prefers to be on 120 mg Cardizem    Past Medical History:  Diagnosis Date   Adenomatous colon polyp    Allergic rhinitis    Anemia    pt states no anemia in her past hx thst she is aware of    Atrial fibrillation ([place])    goes in and out of this rhythm- takes Tenormin   Breast cancer, left breast ([place]) 1994 with lumpectomy   chemo,tamox,radiation   Bronchiectasis with acute exacerbation ([place])    COPD (chronic obstructive pulmonary disease) ([place])    Diverticulosis of colon    DJD (degenerative joint disease)    Dysrhythmia    palpitations   Family history of brain cancer    Family history of colon cancer 02/14/2018   Family history of colon cancer    Family history of malignant neoplasm of gastrointestinal tract    Family history of melanoma    Family history of uterine cancer    Fibromyalgia    GERD (gastroesophageal reflux disease) 02/14/2018   Glaucoma    Hemoptysis    History of pneumonia    Hypercholesteremia    pt denies   Hypertension    Low back pain syndrome    Neuromuscular disorder (HCC)    hx fibromyalgia    Osteopenia    Pneumonia    hx   Pseudomonas infection    Rotator cuff syndrome of right shoulder    Spinal stenosis     Past Surgical History:  Procedure Laterality  Date   CATARACT EXTRACTION Right    COLONOSCOPY     left breast lumpectomy and LN dissection  06/1992   Dr. [name]   LUNG BIOPSY  1968   TSurg---Dr. [name]   LUNG BIOPSY  2012   dr [name]   POLYPECTOMY     REVERSE SHOULDER ARTHROPLASTY Right 08/02/2015   Procedure: RIGHT REVERSE TOTAL SHOULDER ARTHROPLASTY;  Surgeon: [name], MD;  Location: [facility];  Service: Orthopedics;  Laterality: Right;   REVERSE TOTAL SHOULDER ARTHROPLASTY Right 08/02/2015   TONSILLECTOMY     TOTAL KNEE ARTHROPLASTY Right 01/21/2015   Procedure: TOTAL RIGHT KNEE ARTHROPLASTY;  Surgeon: [name], MD;  Location: [facility];  Service: Orthopedics;  Laterality: Right;   TOTAL KNEE ARTHROPLASTY Left 03/23/2016   Procedure: LEFT TOTAL KNEE ARTHROPLASTY;  Surgeon: [name], MD;  Location: [facility];  Service: Orthopedics;  Laterality: Left;     Current Outpatient Medications  Medication Sig Dispense Refill   amoxicillin (AMOXIL) 500 MG capsule Take prior to dental procedures     ASPIRIN 81 PO Take 1 tablet by mouth daily.     Calcium Carb-Cholecalciferol 600-800 MG-UNIT TABS Take 1 tablet by mouth daily.     diltiazem (CARDIZEM  CD) 180 MG 24 hr capsule Take 1 capsule (180 mg total) by mouth daily. 90 capsule 2   losartan (COZAAR) 25 MG tablet Take 0.5 tablets (12.5 mg total) by mouth daily. 45 tablet 3   LUMIGAN 0.01 % SOLN      Multiple Vitamins-Minerals (MULTIVITAMIN & MINERAL PO) Take 1 tablet by mouth daily.     Nutritional Supplements (VITAMIN D BOOSTER PO) Take by mouth.     Respiratory Therapy Supplies (FLUTTER) DEVI 1 application by Does not apply route 3 (three) times daily. 1 each 0   Saccharomyces boulardii (FLORASTOR PO) Take 1 capsule by mouth daily.     Tiotropium Bromide-Olodaterol (STIOLTO RESPIMAT) 2.5-2.5 MCG/ACT AERS Inhale 2 puffs into the lungs daily. 4 g 7   Zinc 30 MG CAPS      No current facility-administered medications for this visit.    Allergies:   Patient has no known allergies.     Social History:  The patient  reports that she quit smoking about 53 years ago. Her smoking use included cigarettes. She has a 10.00 pack-year smoking history. She has never used smokeless tobacco. She reports current alcohol use of about 1.0 standard drink per week. She reports that she does not use drugs.   Family History:  The patient's family history includes Brain cancer in her maternal uncle; Colon cancer (age of onset: 23) in her father; Colon polyps in her daughter; Heart attack in her mother; Hypertension in her brother; Melanoma (age of onset: 54) in her niece; Melanoma (age of onset: 37) in her daughter; Other (age of onset: 75) in her paternal uncle; Stroke in her maternal grandmother; Uterine cancer (age of onset: 6) in her daughter.    ROS:  Please see the history of present illness.  Otherwise, review of systems are positive for none. All other systems are reviewed and negative.    PHYSICAL EXAM: VS:  BP 140/82   Pulse 78   Ht '5\' 3"'$  (1.6 m)   Wt 59.6 kg   LMP 02/23/1985   SpO2 96%   BMI 23.28 kg/m  , BMI Body mass index is 23.28 kg/m.  Affect appropriate Elderly female  HEENT: normal Neck supple with no adenopathy JVP normal no bruits no thyromegaly Lungs clear with no wheezing and good diaphragmatic motion Heart:  S1/S2 no murmur, no rub, gallop or click PMI normal Abdomen: benighn, BS positve, no tenderness, no AAA no bruit.  No HSM or HJR Distal pulses intact with no bruits No edema Neuro non-focal Skin warm and dry No muscular weakness     EKG:  EKG is not ordered today.   Recent Labs: 03/08/2020: ALT 15; BUN 16; Creatinine, Ser 0.64; Hemoglobin 12.6; Platelets 244.0; Potassium 4.4; Sodium 129; TSH 4.06    Lipid Panel    Component Value Date/Time   CHOL 161 03/08/2020 0858   TRIG 45.0 03/08/2020 0858   HDL 66.30 03/08/2020 0858   CHOLHDL 2 03/08/2020 0858   VLDL 9.0 03/08/2020 0858   LDLCALC 85 03/08/2020 0858     Wt Readings from Last 3  Encounters:  10/04/20 59.6 kg  09/11/20 60.8 kg  08/27/20 61.1 kg    Other studies Reviewed: Additional studies/ records that were reviewed today include: . Review of the above records demonstrates:   Lower extremity doppler studies 01/10/20:   Right: Resting right ankle-brachial index is within normal range. No  evidence of significant right lower extremity arterial disease. The right  toe-brachial index is normal.  Left: Resting left ankle-brachial index is within normal range. No  evidence of significant left lower extremity arterial disease. The left  toe-brachial index is normal.  ASSESSMENT AND PLAN:  1.  Arrhythmia:  - ? MAT like monitor with PAC/PVCls continue diltiazem 120 mg daily    2.  Aortic atherosclerosis: -ASA no clinical CAD    3.  HTN: -Stable    4.  Bronchiectasis: -Followed by Dr. Valeta Harms with pulmonary medicine   5.  Back Pain  -Improved, found to have spinal stenosis on MRI>>>followed with PT>>ipredpac per primary 09/15/20   Current medicines are reviewed at length with the patient today.  The patient does not have concerns regarding medicines.  The following changes have been made:  reduce losartan to 12.5 and increase diltiazem to '180mg'$    Labs/ tests ordered today include: None No orders of the defined types were placed in this encounter.  Disposition:   FU in a year   Signed, Jenkins Rouge, MD  10/04/2020 8:47 AM    [facility] [address], [place], [place]  [address] Phone: [phone]; Fax: [phone]

## 2020-10-01 DIAGNOSIS — H35372 Puckering of macula, left eye: Secondary | ICD-10-CM | POA: Diagnosis not present

## 2020-10-01 DIAGNOSIS — H2512 Age-related nuclear cataract, left eye: Secondary | ICD-10-CM | POA: Diagnosis not present

## 2020-10-01 DIAGNOSIS — H524 Presbyopia: Secondary | ICD-10-CM | POA: Diagnosis not present

## 2020-10-01 DIAGNOSIS — H401134 Primary open-angle glaucoma, bilateral, indeterminate stage: Secondary | ICD-10-CM | POA: Diagnosis not present

## 2020-10-03 ENCOUNTER — Other Ambulatory Visit: Payer: Self-pay | Admitting: Internal Medicine

## 2020-10-04 ENCOUNTER — Ambulatory Visit (INDEPENDENT_AMBULATORY_CARE_PROVIDER_SITE_OTHER): Payer: Medicare PPO | Admitting: Cardiovascular Disease

## 2020-10-04 ENCOUNTER — Other Ambulatory Visit: Payer: Self-pay

## 2020-10-04 ENCOUNTER — Encounter: Payer: Self-pay | Admitting: Cardiovascular Disease

## 2020-10-04 VITALS — BP 140/82 | HR 78 | Ht 63.0 in | Wt 131.4 lb

## 2020-10-04 DIAGNOSIS — G8929 Other chronic pain: Secondary | ICD-10-CM | POA: Diagnosis not present

## 2020-10-04 DIAGNOSIS — I498 Other specified cardiac arrhythmias: Secondary | ICD-10-CM | POA: Diagnosis not present

## 2020-10-04 DIAGNOSIS — I700 Atherosclerosis of aorta: Secondary | ICD-10-CM

## 2020-10-04 DIAGNOSIS — I10 Essential (primary) hypertension: Secondary | ICD-10-CM | POA: Diagnosis not present

## 2020-10-04 DIAGNOSIS — M5440 Lumbago with sciatica, unspecified side: Secondary | ICD-10-CM

## 2020-10-04 MED ORDER — DILTIAZEM HCL ER COATED BEADS 120 MG PO CP24: 120.0000 mg | ORAL_CAPSULE | Freq: Every day | ORAL | 3 refills | Status: DC

## 2020-10-04 NOTE — Patient Instructions (Signed)
Medication Instructions:  Reduce Diltiazem to 120 mg daily *If you need a refill on your cardiac medications before your next appointment, please call your pharmacy*   Lab Work: none If you have labs (blood work) drawn today and your tests are completely normal, you will receive your results only by: [place] (if you have MyChart) OR A paper copy in the mail If you have any lab test that is abnormal or we need to change your treatment, we will call you to review the results.   Testing/Procedures: none   Follow-Up: At [facility], you and your health needs are our priority.  As part of our continuing mission to provide you with exceptional heart care, we have created designated Provider Care Teams.  These Care Teams include your primary Cardiologist (physician) and Advanced Practice Providers (APPs -  Physician Assistants and Nurse Practitioners) who all work together to provide you with the care you need, when you need it.  We recommend signing up for the patient portal called "MyChart".  Sign up information is provided on this After Visit Summary.  MyChart is used to connect with patients for Virtual Visits (Telemedicine).  Patients are able to view lab/test results, encounter notes, upcoming appointments, etc.  Non-urgent messages can be sent to your provider as well.   To learn more about what you can do with MyChart, go to [email or web address].    Your next appointment:   12 month(s)  The format for your next appointment:   In Person  Provider:   Jenkins Rouge, MD   Other Instructions

## 2020-10-14 ENCOUNTER — Encounter: Payer: Self-pay | Admitting: Obstetrics and Gynecology

## 2020-10-14 ENCOUNTER — Ambulatory Visit (INDEPENDENT_AMBULATORY_CARE_PROVIDER_SITE_OTHER): Payer: Medicare PPO | Admitting: Obstetrics and Gynecology

## 2020-10-14 ENCOUNTER — Other Ambulatory Visit: Payer: Self-pay

## 2020-10-14 VITALS — BP 130/68 | HR 88 | Ht 62.5 in | Wt 134.0 lb

## 2020-10-14 DIAGNOSIS — M810 Age-related osteoporosis without current pathological fracture: Secondary | ICD-10-CM

## 2020-10-14 DIAGNOSIS — Z9189 Other specified personal risk factors, not elsewhere classified: Secondary | ICD-10-CM

## 2020-10-14 DIAGNOSIS — Z853 Personal history of malignant neoplasm of breast: Secondary | ICD-10-CM | POA: Diagnosis not present

## 2020-10-14 NOTE — Progress Notes (Signed)
87 y.o. [id number] Married White or Caucasian Not Hispanic or Latino female here for breast and pelvic. She went off of vaginal estrogen last year, doing fine. No vaginal bleeding. Not sexually active.   BM go from hard to loose, BM used to be daily, now every other day. Bowel changes started about 3 months ago. She drinks 64 oz of water a day.   H/O breast cancer.    FH uterine cancer in her daughter with genetic mutation in MSH6, patient is negative for this gene    Patient's last menstrual period was 02/23/1985.          Sexually active: No.  The current method of family planning is post menopausal status.    Exercising: Yes.     Walking weights and bike  Smoker:  no  Health Maintenance: Pap:   06/24/17 WNL History of abnormal Pap:  no MMG:  11/01/19 Bi-rads 2 benign  BMD:   06/09/17 osteoporosis  Colonoscopy: 08/08/2014 polyps, no f/u recommended  TDaP:  05/09/14  Gardasil: N/A   reports that she quit smoking about 53 years ago. Her smoking use included cigarettes. She has a 10.00 pack-year smoking history. She has never used smokeless tobacco. She reports current alcohol use of about 1.0 standard drink per week. She reports that she does not use drugs. 5 kids, 7 grand kids (3 are step), one great grandchild  Past Medical History:  Diagnosis Date   Adenomatous colon polyp    Allergic rhinitis    Anemia    pt states no anemia in her past hx thst she is aware of    Atrial fibrillation ([place])    goes in and out of this rhythm- takes Tenormin   Breast cancer, left breast ([place]) 1994 with lumpectomy   chemo,tamox,radiation   Bronchiectasis with acute exacerbation ([place])    COPD (chronic obstructive pulmonary disease) ([place])    Diverticulosis of colon    DJD (degenerative joint disease)    Dysrhythmia    palpitations   Family history of brain cancer    Family history of colon cancer 02/14/2018   Family history of colon cancer    Family history of malignant neoplasm of gastrointestinal  tract    Family history of melanoma    Family history of uterine cancer    Fibromyalgia    GERD (gastroesophageal reflux disease) 02/14/2018   Glaucoma    Hemoptysis    History of pneumonia    Hypercholesteremia    pt denies   Hypertension    Low back pain syndrome    Neuromuscular disorder (HCC)    hx fibromyalgia    Osteopenia    Pneumonia    hx   Pseudomonas infection    Rotator cuff syndrome of right shoulder    Spinal stenosis     Past Surgical History:  Procedure Laterality Date   CATARACT EXTRACTION Right    COLONOSCOPY     left breast lumpectomy and LN dissection  06/1992   Dr. [name]   LUNG BIOPSY  1968   TSurg---Dr. [name]   LUNG BIOPSY  2012   dr [name]   POLYPECTOMY     REVERSE SHOULDER ARTHROPLASTY Right 08/02/2015   Procedure: RIGHT REVERSE TOTAL SHOULDER ARTHROPLASTY;  Surgeon: [name], MD;  Location: [facility];  Service: Orthopedics;  Laterality: Right;   REVERSE TOTAL SHOULDER ARTHROPLASTY Right 08/02/2015   TONSILLECTOMY     TOTAL KNEE ARTHROPLASTY Right 01/21/2015   Procedure: TOTAL RIGHT KNEE ARTHROPLASTY;  Surgeon: Rodman Key  Alvan Dame, MD;  [facility];  Service: Orthopedics;  Laterality: Right;   TOTAL KNEE ARTHROPLASTY Left 03/23/2016   Procedure: LEFT TOTAL KNEE ARTHROPLASTY;  Surgeon: [name], MD;  [facility];  Service: Orthopedics;  Laterality: Left;    Current Outpatient Medications  Medication Sig Dispense Refill   amoxicillin (AMOXIL) 500 MG capsule Take prior to dental procedures     ASPIRIN 81 PO Take 1 tablet by mouth daily.     Calcium Carb-Cholecalciferol 600-800 MG-UNIT TABS Take 1 tablet by mouth daily.     diltiazem (CARDIZEM CD) 120 MG 24 hr capsule Take 1 capsule (120 mg total) by mouth daily. 90 capsule 3   guaiFENesin (MUCINEX) 600 MG 12 hr tablet Take by mouth 2 (two) times daily.     losartan (COZAAR) 25 MG tablet Take 0.5 tablets (12.5 mg total) by mouth daily. 45 tablet 3   LUMIGAN 0.01 % SOLN      Multiple  Vitamins-Minerals (MULTIVITAMIN & MINERAL PO) Take 1 tablet by mouth daily.     Nutritional Supplements (VITAMIN D BOOSTER PO) Take by mouth.     Respiratory Therapy Supplies (FLUTTER) DEVI 1 application by Does not apply route 3 (three) times daily. 1 each 0   Saccharomyces boulardii (FLORASTOR PO) Take 1 capsule by mouth daily.     Tiotropium Bromide-Olodaterol (STIOLTO RESPIMAT) 2.5-2.5 MCG/ACT AERS Inhale 2 puffs into the lungs daily. 4 g 7   Zinc 30 MG CAPS      No current facility-administered medications for this visit.    Family History  Problem Relation Age of Onset   Heart attack Mother        Deceased age 66   Colon cancer Father 59       deceased age 46 from colon ca.   Hypertension Brother    Colon polyps Daughter    Uterine cancer Daughter 62   Brain cancer Maternal Uncle    Stroke Maternal Grandmother    Other Paternal Uncle 59       possible cancer   Melanoma Daughter 13   Melanoma Niece 25       d. 53   Esophageal cancer Neg Hx    Stomach cancer Neg Hx    Rectal cancer Neg Hx     Review of Systems  All other systems reviewed and are negative.  Exam:   BP 130/68   Pulse 88   Ht 5' 2.5" (1.588 m)   Wt 134 lb (60.8 kg)   LMP 02/23/1985   SpO2 97%   BMI 24.12 kg/m   Weight change: '@WEIGHTCHANGE' @ Height:   Height: 5' 2.5" (158.8 cm)  Ht Readings from Last 3 Encounters:  10/14/20 5' 2.5" (1.588 m)  10/04/20 '5\' 3"'  (1.6 m)  09/11/20 '5\' 3"'  (1.6 m)    General appearance: alert, cooperative and appears stated age Head: Normocephalic, without obvious abnormality, atraumatic Neck: no adenopathy, supple, symmetrical, trachea midline and thyroid normal to inspection and palpation Lungs: clear to auscultation bilaterally Cardiovascular: regular rate and rhythm Breasts:  left breast is firm s/p radiation, evidence of left lumpectomy. Right breast without lumps or skin changes Abdomen: soft, non-tender; non distended,  no masses,  no organomegaly Extremities:  extremities normal, atraumatic, no cyanosis or edema Skin: Skin color, texture, turgor normal. No rashes or lesions Lymph nodes: Cervical, supraclavicular, and axillary nodes normal. No abnormal inguinal nodes palpated Neurologic: Grossly normal   Pelvic: External genitalia:  no lesions  Urethra:  normal appearing urethra with no masses, tenderness or lesions              Bartholins and Skenes: normal                 Vagina: normal appearing vagina with normal color and discharge, no lesions              Cervix: no lesions               Bimanual Exam:  Uterus:  normal size, contour, position, consistency, mobility, non-tender              Adnexa: no mass, fullness, tenderness               Rectovaginal: Confirms               Anus:  normal sphincter tone, no lesions    1. GYN exam for high-risk Medicare patient Mammogram scheduled No pap needed Labs with primary No colonoscopy needed  2. Age-related osteoporosis without current pathological fracture Discussed calcium and vit D DEXA ordered  3. History of left breast cancer

## 2020-10-14 NOTE — Patient Instructions (Signed)

## 2020-10-15 DIAGNOSIS — H26491 Other secondary cataract, right eye: Secondary | ICD-10-CM | POA: Diagnosis not present

## 2020-10-16 DIAGNOSIS — Z1231 Encounter for screening mammogram for malignant neoplasm of breast: Secondary | ICD-10-CM | POA: Diagnosis not present

## 2020-10-23 DIAGNOSIS — M8589 Other specified disorders of bone density and structure, multiple sites: Secondary | ICD-10-CM | POA: Diagnosis not present

## 2020-10-23 DIAGNOSIS — Z780 Asymptomatic menopausal state: Secondary | ICD-10-CM | POA: Diagnosis not present

## 2020-10-30 ENCOUNTER — Encounter: Payer: Self-pay | Admitting: Obstetrics and Gynecology

## 2020-11-26 ENCOUNTER — Telehealth: Payer: Self-pay | Admitting: Pulmonary Disease

## 2020-11-26 DIAGNOSIS — R918 Other nonspecific abnormal finding of lung field: Secondary | ICD-10-CM

## 2020-11-26 NOTE — Telephone Encounter (Signed)
BI please advise if you would like to schedule the pt for CT chest.  She is wanting to schedule this . It has been almost a year since her last one.  Thanks  She has a pending appt with you on 01/01/21.

## 2020-11-28 NOTE — Telephone Encounter (Signed)
Non-contrasted CT Chest  Dx lung nodule  Please schedule prior to office appt

## 2020-11-28 NOTE — Telephone Encounter (Signed)
I have called the pt and she is aware that this will need to be done prior to her OV on 11/09.  Nothing further is needed.

## 2020-12-02 ENCOUNTER — Other Ambulatory Visit: Payer: Self-pay

## 2020-12-02 ENCOUNTER — Encounter: Payer: Self-pay | Admitting: Obstetrics and Gynecology

## 2020-12-02 ENCOUNTER — Ambulatory Visit (INDEPENDENT_AMBULATORY_CARE_PROVIDER_SITE_OTHER): Payer: Medicare PPO | Admitting: Obstetrics and Gynecology

## 2020-12-02 VITALS — BP 130/63 | HR 72 | Wt 133.0 lb

## 2020-12-02 DIAGNOSIS — M810 Age-related osteoporosis without current pathological fracture: Secondary | ICD-10-CM | POA: Diagnosis not present

## 2020-12-02 MED ORDER — ALENDRONATE SODIUM 70 MG PO TABS: 70.0000 mg | ORAL_TABLET | ORAL | 3 refills | Status: DC

## 2020-12-02 NOTE — Patient Instructions (Signed)

## 2020-12-02 NOTE — Progress Notes (Signed)
GYNECOLOGY  VISIT   HPI: 87 y.o.   Married White or Caucasian Not Hispanic or Latino  female   [phone] with Patient's last menstrual period was 02/23/1985.   here for consult about bone density results.  Recent DEXA returned with osteopenia, T score -1.5, but with an elevated risk of fracture. FRAX 12/3.2%. She had some improvement of bone density in the left hip, but a decrease in her spine and right hip.   She walks 2 miles, 6 days a week. She is getting 5 servings a day of calcium. She takes 1 Citracal a day and 2,000 IU vit d a day.   Her house is set up to try and prevent falls.   Labs from 03/08/20 reviewed: Vit D 54.75 Creatinine 0.64 Calcium 9.1 WBC 4.3, Hgb 12.6, Plt 244    Distant h/o reflux, not a problem currently.   GYNECOLOGIC HISTORY: Patient's last menstrual period was 02/23/1985. Contraception:PMP  Menopausal hormone therapy: NA        OB History     Gravida  5   Para  5   Term  5   Preterm      AB      Living  5      SAB      IAB      Ectopic      Multiple      Live Births  5              Patient Active Problem List   Diagnosis Date Noted   Low back pain 09/15/2020   Aortic atherosclerosis ([place]) 09/11/2020   Submandibular gland mass 03/20/2020   Hyponatremia 03/20/2020   Hyperglycemia 02/20/2019   Genetic testing 11/11/2018   Family history of uterine cancer    Family history of brain cancer    Family history of melanoma    GERD (gastroesophageal reflux disease) 02/14/2018   Glaucoma 02/14/2018   Family history of colon cancer 02/14/2018   Encounter for well adult exam with abnormal findings 02/14/2018   Osteoporosis 02/14/2018   History of left breast cancer 01/13/2018   S/P knee replacement 03/23/2016   S/P shoulder replacement 08/02/2015   Palpitations 08/20/2014   Episode of hypertension 08/20/2014   COPD, moderate ([place]) 05/09/2014   Premature atrial contractions 11/08/2013   Cardiac arrhythmia 05/10/2013    Trochanteric bursitis of left hip 02/02/2012   Osteoarthritis of left hip 06/25/2011   Leg length inequality 06/25/2011   Left ovarian cyst 01/20/2011   Elevated lipids 11/04/2009   Acute cystitis 11/04/2009   LOW BACK PAIN SYNDROME 05/07/2008   Bronchiectasis with acute exacerbation ([place]) 03/18/2007   Hemoptysis 03/18/2007   PNEUMONIA 03/07/2007   Disorder of bone and cartilage 03/07/2007   BRONCHIECTASIS 03/04/2007   Osteoarthritis 03/04/2007    Past Medical History:  Diagnosis Date   Adenomatous colon polyp    Allergic rhinitis    Anemia    pt states no anemia in her past hx thst she is aware of    Atrial fibrillation ([place])    goes in and out of this rhythm- takes Tenormin   Breast cancer, left breast ([place]) 1994 with lumpectomy   chemo,tamox,radiation   Bronchiectasis with acute exacerbation ([place])    COPD (chronic obstructive pulmonary disease) ([place])    Diverticulosis of colon    DJD (degenerative joint disease)    Dysrhythmia    palpitations   Family history of brain cancer    Family history of colon cancer 02/14/2018  Family history of colon cancer    Family history of malignant neoplasm of gastrointestinal tract    Family history of melanoma    Family history of uterine cancer    Fibromyalgia    GERD (gastroesophageal reflux disease) 02/14/2018   Glaucoma    Hemoptysis    History of pneumonia    Hypercholesteremia    pt denies   Hypertension    Low back pain syndrome    Neuromuscular disorder ([place])    hx fibromyalgia    Osteopenia    Pneumonia    hx   Pseudomonas infection    Rotator cuff syndrome of right shoulder    Spinal stenosis     Past Surgical History:  Procedure Laterality Date   CATARACT EXTRACTION Right    COLONOSCOPY     left breast lumpectomy and LN dissection  06/1992   Dr. [name]   LUNG BIOPSY  1968   TSurg---Dr. [name]   LUNG BIOPSY  2012   dr [name]   POLYPECTOMY     REVERSE SHOULDER ARTHROPLASTY Right 08/02/2015   Procedure:  RIGHT REVERSE TOTAL SHOULDER ARTHROPLASTY;  Surgeon: [name], MD;  Location: [facility];  Service: Orthopedics;  Laterality: Right;   REVERSE TOTAL SHOULDER ARTHROPLASTY Right 08/02/2015   TONSILLECTOMY     TOTAL KNEE ARTHROPLASTY Right 01/21/2015   Procedure: TOTAL RIGHT KNEE ARTHROPLASTY;  Surgeon: [name], MD;  Location: [facility];  Service: Orthopedics;  Laterality: Right;   TOTAL KNEE ARTHROPLASTY Left 03/23/2016   Procedure: LEFT TOTAL KNEE ARTHROPLASTY;  Surgeon: [name], MD;  Location: [facility];  Service: Orthopedics;  Laterality: Left;    Current Outpatient Medications  Medication Sig Dispense Refill   amoxicillin (AMOXIL) 500 MG capsule Take prior to dental procedures     ASPIRIN 81 PO Take 1 tablet by mouth daily.     Calcium Carb-Cholecalciferol 600-800 MG-UNIT TABS Take 1 tablet by mouth daily.     diltiazem (CARDIZEM CD) 120 MG 24 hr capsule Take 1 capsule (120 mg total) by mouth daily. 90 capsule 3   guaiFENesin (MUCINEX) 600 MG 12 hr tablet Take by mouth 2 (two) times daily.     LUMIGAN 0.01 % SOLN      Multiple Vitamins-Minerals (MULTIVITAMIN & MINERAL PO) Take 1 tablet by mouth daily.     Nutritional Supplements (VITAMIN D BOOSTER PO) Take by mouth.     Respiratory Therapy Supplies (FLUTTER) DEVI 1 application by Does not apply route 3 (three) times daily. 1 each 0   Saccharomyces boulardii (FLORASTOR PO) Take 1 capsule by mouth daily.     Tiotropium Bromide-Olodaterol (STIOLTO RESPIMAT) 2.5-2.5 MCG/ACT AERS Inhale 2 puffs into the lungs daily. 4 g 7   Zinc 30 MG CAPS      losartan (COZAAR) 25 MG tablet Take 0.5 tablets (12.5 mg total) by mouth daily. 45 tablet 3   No current facility-administered medications for this visit.     ALLERGIES: Patient has no known allergies.  Family History  Problem Relation Age of Onset   Heart attack Mother        Deceased age 60   Colon cancer Father 11       deceased age 13 from colon ca.   Hypertension Brother    Colon  polyps Daughter    Uterine cancer Daughter 10   Brain cancer Maternal Uncle    Stroke Maternal Grandmother    Other Paternal Uncle 12       possible cancer  Melanoma Daughter 58   Melanoma Niece 68       d. 76   Esophageal cancer Neg Hx    Stomach cancer Neg Hx    Rectal cancer Neg Hx     Social History   Socioeconomic History   Marital status: Married    Spouse name: Not on file   Number of children: 5   Years of education: Not on file   Highest education level: Not on file  Occupational History   Occupation: Retired  Tobacco Use   Smoking status: Former    Packs/day: 0.25    Years: 40.00    Pack years: 10.00    Types: Cigarettes    Quit date: 02/24/1967    Years since quitting: 53.8   Smokeless tobacco: Never  Vaping Use   Vaping Use: Never used  Substance and Sexual Activity   Alcohol use: Yes    Alcohol/week: 1.0 standard drink    Types: 1 Standard drinks or equivalent per week    Comment: occasionally   Drug use: No   Sexual activity: Not Currently    Partners: Male    Comment: husband vasectomy  Other Topics Concern   Not on file  Social History Narrative   Not on file   Social Determinants of Health   Financial Resource Strain: Low Risk    Difficulty of Paying Living Expenses: Not hard at all  Food Insecurity: No Food Insecurity   Worried About Charity fundraiser in the Last Year: Never true   [place] in the Last Year: Never true  Transportation Needs: No Transportation Needs   Lack of Transportation (Medical): No   Lack of Transportation (Non-Medical): No  Physical Activity: Sufficiently Active   Days of Exercise per Week: 5 days   Minutes of Exercise per Session: 30 min  Stress: No Stress Concern Present   Feeling of Stress : Not at all  Social Connections: Socially Integrated   Frequency of Communication with Friends and Family: More than three times a week   Frequency of Social Gatherings with Friends and Family: Once a week    Attends Religious Services: 1 to 4 times per year   Active Member of [employer] or Organizations: Yes   Attends Archivist Meetings: 1 to 4 times per year   Marital Status: Married  Human resources officer Violence: Not on file    Review of Systems  All other systems reviewed and are negative.  PHYSICAL EXAMINATION:    BP 130/63   Pulse 72   Wt 133 lb (60.3 kg)   LMP 02/23/1985   SpO2 99%   BMI 23.94 kg/m     General appearance: alert, cooperative and appears stated age  83. Age-related osteoporosis without current pathological fracture She is exercising, getting adequate calcium and vit D, has made changes in her house to decrease the risk of falls. Labs from earlier this year were normal, including CA, Vit D, CBC, renal function - Magnesium - Phosphorus Patient counseled about her DEXA and recommendation for medication. Discussed the risks and benefits of Fosamax and Prolia. Recommended Fosamax as a front line agent. No contraindications.  As long as her labs are normal will start Fosamax - alendronate (FOSAMAX) 70 MG tablet; Take 1 tablet (70 mg total) by mouth every 7 (seven) days. Take first thing in am with 6 oz. Water.  Be upright after taking.  Eat nothing for one hour.  Dispense: 12 tablet; Refill: 3  CC:  Dr John 

## 2020-12-03 LAB — MAGNESIUM: Magnesium: 2 mg/dL (ref 1.5–2.5)

## 2020-12-03 LAB — PHOSPHORUS: Phosphorus: 4 mg/dL (ref 2.1–4.3)

## 2020-12-06 ENCOUNTER — Ambulatory Visit (INDEPENDENT_AMBULATORY_CARE_PROVIDER_SITE_OTHER)
Admission: RE | Admit: 2020-12-06 | Discharge: 2020-12-06 | Disposition: A | Discharge status: Home / Self Care | Payer: Medicare PPO | Source: Ambulatory Visit | Attending: Pulmonary Disease | Admitting: Pulmonary Disease

## 2020-12-06 ENCOUNTER — Other Ambulatory Visit: Payer: Self-pay

## 2020-12-06 DIAGNOSIS — I700 Atherosclerosis of aorta: Secondary | ICD-10-CM | POA: Diagnosis not present

## 2020-12-06 DIAGNOSIS — J479 Bronchiectasis, uncomplicated: Secondary | ICD-10-CM | POA: Diagnosis not present

## 2020-12-06 DIAGNOSIS — R918 Other nonspecific abnormal finding of lung field: Secondary | ICD-10-CM

## 2020-12-06 DIAGNOSIS — R911 Solitary pulmonary nodule: Secondary | ICD-10-CM | POA: Diagnosis not present

## 2020-12-16 DIAGNOSIS — S0502XA Injury of conjunctiva and corneal abrasion without foreign body, left eye, initial encounter: Secondary | ICD-10-CM | POA: Diagnosis not present

## 2020-12-25 DIAGNOSIS — S0502XD Injury of conjunctiva and corneal abrasion without foreign body, left eye, subsequent encounter: Secondary | ICD-10-CM | POA: Diagnosis not present

## 2021-01-01 ENCOUNTER — Encounter: Payer: Self-pay | Admitting: Pulmonary Disease

## 2021-01-01 ENCOUNTER — Ambulatory Visit (INDEPENDENT_AMBULATORY_CARE_PROVIDER_SITE_OTHER): Payer: Medicare PPO | Admitting: Pulmonary Disease

## 2021-01-01 ENCOUNTER — Other Ambulatory Visit: Payer: Self-pay

## 2021-01-01 VITALS — BP 132/60 | HR 81 | Temp 97.5°F | Ht 64.0 in | Wt 132.0 lb

## 2021-01-01 DIAGNOSIS — J479 Bronchiectasis, uncomplicated: Secondary | ICD-10-CM | POA: Diagnosis not present

## 2021-01-01 DIAGNOSIS — R093 Abnormal sputum: Secondary | ICD-10-CM

## 2021-01-01 DIAGNOSIS — Z853 Personal history of malignant neoplasm of breast: Secondary | ICD-10-CM | POA: Diagnosis not present

## 2021-01-01 DIAGNOSIS — R918 Other nonspecific abnormal finding of lung field: Secondary | ICD-10-CM

## 2021-01-01 MED ORDER — ALBUTEROL SULFATE HFA 108 (90 BASE) MCG/ACT IN AERS: 2.0000 | INHALATION_SPRAY | Freq: Four times a day (QID) | RESPIRATORY_TRACT | 6 refills | Status: DC | PRN

## 2021-01-01 NOTE — Progress Notes (Signed)
Synopsis: Referred in July 2020 for est care from Dr. [name], PCP: [name], MD  Subjective:   PATIENT ID: Miranda Thompson GENDER: female DOB: 03/10/1933, MRN: [id number]  Chief Complaint  Patient presents with  . Follow-up    bronchiectasis    Former patient of Dr. [name].  Followed for COPD and bronchiectasis.  Here today to establish care.  She is currently managed with a flutter valve and individual postural drainage at home.  She exercises regularly.  She rides a stationary Peloton bicycle 6 times per week.  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  She believes that her first lung injury occurred when she was washing her husband's clothes many years ago in the basement when she decided to mix Clorox bleach and ammonium.  She was a horrible gas fumes that came out of the basement washing machine that drove her out of the home.  She states her flutter valve works some of the times but does not work as well as postural drainage.  As she is getting older she has become weaker and unable to do these at home.  She currently lives alone is unable to do CPT or have someone to help her with CPT.  She has not had axial CT imaging of the chest since 2004.  She did have breast cancer in the early 90s status post radiation to the left side following a lumpectomy.  She has daily cough and sputum production.  No recent fevers.  OV 03/22/2019: Patient seen today for follow-up in the office for her COPD and bronchiectasis.  Currently symptoms are at baseline.  She does have daily sputum production.  This is better with postural drainage daily.  Currently using her inhaler regimen and.  She is on a ICS/LABA.  Patient has several questions today.  Including Covid vaccine questions.  All of these were addressed during the office visit.  Patient has received her first Covid vaccine.  OV 12/25/2019: Here today for follow-up after recent noncontrasted CT scan of  the chest.  It does reveal bilateral areas of diffuse nodularity and persistent verrucoid and cystic bronchiectasis.  No other significant changes.  She does notice there are readings that report coronary disease.  She has questions about this today.  We reviewed her CT scan imaging in detail with patient and patient's daughter present at bedside.  She also complains today of heaviness.  And her aches lower extremities.  This predominantly occurs with exertion or with walking.  She notices a heavy sensation in her lower extremities this is new over the past couple of months and has been insidious with onset.  OV 01/01/2021: Here today for follow-up after recent CT scan of the chest.  CT was completed a few weeks ago in October which revealed evidence of varicoid bronchiectasis and a few cysts within the lung.  Other scattered small nodules.  Her bronchiectasis is overall stable.  No significant change from previous CT imaging.  From respiratory standpoint she is able to complete all of her activities of daily living.  She does use flutter valve occasionally.  She routinely uses exercise for airway clearance methodologies.  Does not have that significant amount of sputum production.  Also doing well with her Stiolto daily.    Past Medical History:  Diagnosis Date  . Adenomatous colon polyp   . Allergic rhinitis   . Anemia    pt states no anemia in  her past hx thst she is aware of   . Atrial fibrillation ([place])    goes in and out of this rhythm- takes Tenormin  . Breast cancer, left breast ([place]) 1994 with lumpectomy   chemo,tamox,radiation  . Bronchiectasis with acute exacerbation ([place])   . COPD (chronic obstructive pulmonary disease) ([place])   . Diverticulosis of colon   . DJD (degenerative joint disease)   . Dysrhythmia    palpitations  . Family history of brain cancer   . Family history of colon cancer 02/14/2018  . Family history of colon cancer   . Family history of malignant neoplasm of  gastrointestinal tract   . Family history of melanoma   . Family history of uterine cancer   . Fibromyalgia   . GERD (gastroesophageal reflux disease) 02/14/2018  . Glaucoma   . Hemoptysis   . History of pneumonia   . Hypercholesteremia    pt denies  . Hypertension   . Low back pain syndrome   . Neuromuscular disorder ([facility])    hx fibromyalgia   . Osteopenia   . Pneumonia    hx  . Pseudomonas infection   . Rotator cuff syndrome of right shoulder   . Spinal stenosis      Family History  Problem Relation Age of Onset  . Heart attack Mother        Deceased age 20  . Colon cancer Father 42       deceased age 1 from colon ca.  . Hypertension Brother   . Colon polyps Daughter   . Uterine cancer Daughter 75  . Brain cancer Maternal Uncle   . Stroke Maternal Grandmother   . Other Paternal Uncle 12       possible cancer  . Melanoma Daughter 22  . Melanoma Niece 25       d. 64  . Esophageal cancer Neg Hx   . Stomach cancer Neg Hx   . Rectal cancer Neg Hx      Past Surgical History:  Procedure Laterality Date  . CATARACT EXTRACTION Right   . COLONOSCOPY    . left breast lumpectomy and LN dissection  06/1992   Dr. [name]  . LUNG BIOPSY  1968   TSurg---Dr. [name]  . LUNG BIOPSY  2012   dr [name]  . POLYPECTOMY    . REVERSE SHOULDER ARTHROPLASTY Right 08/02/2015   Procedure: RIGHT REVERSE TOTAL SHOULDER ARTHROPLASTY;  Surgeon: [name], MD;  Location: [facility];  Service: Orthopedics;  Laterality: Right;  . REVERSE TOTAL SHOULDER ARTHROPLASTY Right 08/02/2015  . TONSILLECTOMY    . TOTAL KNEE ARTHROPLASTY Right 01/21/2015   Procedure: TOTAL RIGHT KNEE ARTHROPLASTY;  Surgeon: [name], MD;  Location: [facility];  Service: Orthopedics;  Laterality: Right;  . TOTAL KNEE ARTHROPLASTY Left 03/23/2016   Procedure: LEFT TOTAL KNEE ARTHROPLASTY;  Surgeon: [name], MD;  Location: [facility];  Service: Orthopedics;  Laterality: Left;    Social History   Socioeconomic History  .  Marital status: Married    Spouse name: Not on file  . Number of children: 5  . Years of education: Not on file  . Highest education level: Not on file  Occupational History  . Occupation: Retired  Tobacco Use  . Smoking status: Former    Packs/day: 0.25    Years: 40.00    Pack years: 10.00    Types: Cigarettes    Quit date: 02/24/1967    Years since quitting: 53.8  . Smokeless tobacco:  Never  Vaping Use  . Vaping Use: Never used  Substance and Sexual Activity  . Alcohol use: Yes    Alcohol/week: 1.0 standard drink    Types: 1 Standard drinks or equivalent per week    Comment: occasionally  . Drug use: No  . Sexual activity: Not Currently    Partners: Male    Comment: husband vasectomy  Other Topics Concern  . Not on file  Social History Narrative  . Not on file   Social Determinants of Health   Financial Resource Strain: Low Risk   . Difficulty of Paying Living Expenses: Not hard at all  Food Insecurity: No Food Insecurity  . Worried About Charity fundraiser in the Last Year: Never true  . Ran Out of Food in the Last Year: Never true  Transportation Needs: No Transportation Needs  . Lack of Transportation (Medical): No  . Lack of Transportation (Non-Medical): No  Physical Activity: Sufficiently Active  . Days of Exercise per Week: 5 days  . Minutes of Exercise per Session: 30 min  Stress: No Stress Concern Present  . Feeling of Stress : Not at all  Social Connections: Socially Integrated  . Frequency of Communication with Friends and Family: More than three times a week  . Frequency of Social Gatherings with Friends and Family: Once a week  . Attends Religious Services: 1 to 4 times per year  . Active Member of Clubs or Organizations: Yes  . Attends Archivist Meetings: 1 to 4 times per year  . Marital Status: Married  Human resources officer Violence: Not on file     No Known Allergies   Outpatient Medications Prior to Visit  Medication Sig Dispense  Refill  . amoxicillin (AMOXIL) 500 MG capsule Take prior to dental procedures    . ASPIRIN 81 PO Take 1 tablet by mouth daily.    . Calcium Carb-Cholecalciferol 600-800 MG-UNIT TABS Take 1 tablet by mouth daily.    Marland Kitchen diltiazem (CARDIZEM CD) 120 MG 24 hr capsule Take 1 capsule (120 mg total) by mouth daily. 90 capsule 3  . guaiFENesin (MUCINEX) 600 MG 12 hr tablet Take by mouth 2 (two) times daily.    Marland Kitchen LUMIGAN 0.01 % SOLN     . Multiple Vitamins-Minerals (MULTIVITAMIN & MINERAL PO) Take 1 tablet by mouth daily.    . Nutritional Supplements (VITAMIN D BOOSTER PO) Take by mouth.    . Respiratory Therapy Supplies (FLUTTER) DEVI 1 application by Does not apply route 3 (three) times daily. 1 each 0  . Saccharomyces boulardii (FLORASTOR PO) Take 1 capsule by mouth daily.    . Tiotropium Bromide-Olodaterol (STIOLTO RESPIMAT) 2.5-2.5 MCG/ACT AERS Inhale 2 puffs into the lungs daily. 4 g 7  . Zinc 30 MG CAPS     . alendronate (FOSAMAX) 70 MG tablet Take 1 tablet (70 mg total) by mouth every 7 (seven) days. Take first thing in am with 6 oz. Water.  Be upright after taking.  Eat nothing for one hour. (Patient not taking: Reported on 01/01/2021) 12 tablet 3  . losartan (COZAAR) 25 MG tablet Take 0.5 tablets (12.5 mg total) by mouth daily. 45 tablet 3   No facility-administered medications prior to visit.    Review of Systems  Constitutional:  Negative for chills, fever, malaise/fatigue and weight loss.  HENT:  Negative for hearing loss, sore throat and tinnitus.   Eyes:  Negative for blurred vision and double vision.  Respiratory:  Positive for cough, sputum  production and shortness of breath. Negative for hemoptysis, wheezing and stridor.   Cardiovascular:  Negative for chest pain, palpitations, orthopnea, leg swelling and PND.  Gastrointestinal:  Negative for abdominal pain, constipation, diarrhea, heartburn, nausea and vomiting.  Genitourinary:  Negative for dysuria, hematuria and urgency.   Musculoskeletal:  Negative for joint pain and myalgias.  Skin:  Negative for itching and rash.  Neurological:  Negative for dizziness, tingling, weakness and headaches.  Endo/Heme/Allergies:  Negative for environmental allergies. Does not bruise/bleed easily.  Psychiatric/Behavioral:  Negative for depression. The patient is not nervous/anxious and does not have insomnia.   All other systems reviewed and are negative.   Objective:  Physical Exam Vitals reviewed.  Constitutional:      General: She is not in acute distress.    Appearance: She is well-developed.  HENT:     Head: Normocephalic and atraumatic.  Eyes:     General: No scleral icterus.    Conjunctiva/sclera: Conjunctivae normal.     Pupils: Pupils are equal, round, and reactive to light.  Neck:     Vascular: No JVD.     Trachea: No tracheal deviation.  Cardiovascular:     Rate and Rhythm: Normal rate and regular rhythm.     Heart sounds: Normal heart sounds. No murmur heard. Pulmonary:     Effort: Pulmonary effort is normal. No tachypnea, accessory muscle usage or respiratory distress.     Breath sounds: No stridor. Rhonchi present. No wheezing or rales.  Abdominal:     General: Bowel sounds are normal. There is no distension.     Palpations: Abdomen is soft.     Tenderness: There is no abdominal tenderness.  Musculoskeletal:        General: No tenderness.     Cervical back: Neck supple.  Lymphadenopathy:     Cervical: No cervical adenopathy.  Skin:    General: Skin is warm and dry.     Capillary Refill: Capillary refill takes less than 2 seconds.     Findings: No rash.  Neurological:     Mental Status: She is alert and oriented to person, place, and time.  Psychiatric:        Behavior: Behavior normal.     Vitals:   01/01/21 1554  BP: 132/60  Pulse: 81  Temp: (!) 97.5 F (36.4 C)  TempSrc: Oral  SpO2: 98%  Weight: 132 lb (59.9 kg)  Height: 5\' 4"  (1.626 m)   98% on RA BMI Readings from Last 3  Encounters:  01/01/21 22.66 kg/m  12/02/20 23.94 kg/m  10/14/20 24.12 kg/m   Wt Readings from Last 3 Encounters:  01/01/21 132 lb (59.9 kg)  12/02/20 133 lb (60.3 kg)  10/14/20 134 lb (60.8 kg)    Chest Imaging:  CT Chest in 2004: IMPRESSION 1.  MULTIPLE AREAS OF BRONCHIECTATIC CHANGE WITH ASSOCIATED DISTAL ALVEOLAR INFILTRATES SUGGESTING POSTOBSTRUCTIVE PNEUMONITIS AND/OR ATELECTASIS. SEVERAL OF THE DILATED BRONCHI SHOW INTRALUMINAL MATERIAL AND SUGGEST THE PRESENCE OF MUCOUS PLUGS. 2.  AREA OF FOCAL PLEURAL-BASED DENSITY IN THE RIGHT LOWER WHICH DOES NOT APPEAR TO BE RELATED TO ASSOCIATED BRONCHIECTASIS AND IS MORE WORRISOME FOR AN AREA OF FOCAL NEOPLASTIC CHANGE OR POSSIBLY A FOCUS OF ROUNDED PNEUMONIA.   AN AREA OF FOCAL INFARCTION WOULD BE A THIRD CONSIDERATION IN THE APPROPRIATE CLINICAL SETTING.  SHORT-TERM FOLLOW-UP WITH CT CAN BE UNDERTAKEN TO ASSESS FOR SOME DEGREE OF INTERVAL RESOLUTION IN TWO TO THREE WEEKS' TIME AS MIGHT BE EXPECTED WITH EITHER A PNEUMONITIS OR A RESOLVING  AREA OF INFARCTION.  IF NO INTERVAL RESOLUTION IS APPRECIATED OVER THIS PERIOD OF TIME, THEN A NEOPLASTIC PROCESS WOULD BE A MORE WORRISOME CONSIDERATION AND TISSUE SAMPLING WOULD BE RECOMMENDED AT THAT POINT.  2018 chest x-ray: Chronic interstitial changes within the bilateral bases. The patient's images have been independently reviewed by me.    12/16/2018 CT chest: Areas of cystic bronchiectasis within the lower lobes, dilated bronchi with mucus plugging.  Patient also has few scattered small lung nodules.  Enlarged pulmonary arteries concerning for pulmonary hypertension. The patient's images have been independently reviewed by me.    October 2021 CT chest: Significant areas of cystic and varicoid bronchiectasis dilated airways as compared to previous imaging nodule stable. The patient's images have been independently reviewed by me.    October 2022 CT chest: Significant cystic and  varicoid bronchiectasis, multiple pulmonary nodules all stable. The patient's images have been independently reviewed by me.    Pulmonary Functions Testing Results: No flowsheet data found.   FeNO: None   Pathology: None   Echocardiogram:   09/2018: IMPRESSIONS      1. The left ventricle has normal systolic function with an ejection fraction of 60-65%. The cavity size was normal. Left ventricular diastolic Doppler parameters are consistent with pseudonormalization.  2. The right ventricle has normal systolic function. The cavity was normal. There is no increase in right ventricular wall thickness.  3. No evidence of mitral valve stenosis.  4. No stenosis of the aortic valve.  5. The aorta is normal in size and structure.  6. The aortic root and ascending aorta are normal in size and structure.  7. Grossly normal.  Heart Catheterization: None     Assessment & Plan:     ICD-10-CM   1. Multiple pulmonary nodules  R91.8     2. Bronchiectasis without complication ([place])  L93.7     3. History of left breast cancer  Z85.3     4. Increased sputum production  R09.3       Assessment:  This is an 87 year old female with a longstanding history of bronchiectasis currently managed with postural drainage, flutter valve and airway clearance techniques.  She is routinely using exercise to help with this including cycling.  She is on a regular bronchodilator regimen and doing well with Stiolto plus as needed albuterol.  Pulmonary nodules have been stable.  She is not had any exacerbations within the past year.  Plan: Continue current Stiolto regimen As needed albuterol Would like to avoid ICS in the setting of bronchiectasis. She has not had any exacerbations within the past year. If her sputum production becomes too difficult to handle with her current techniques I think the next step would only be to add a vest therapy. She understands this. Otherwise when to continue as we are.  As  for following of bronchiectasis with CT imaging.  I do not think she needs another repeat CT until 2 to 3 years from now. Patient is also agreeable to this plan. We reviewed her CT imaging as described above today in the office with her and her husband.  Return to clinic in 1 year or as needed.    Garner Nash, DO [facility] 01/01/2021 4:01 PM

## 2021-01-01 NOTE — Patient Instructions (Addendum)
Thank you for visiting Dr. Valeta Harms at [facility]. Today we recommend the following:  Continue Stiolto  Continue albuterol as needed  Continue flutter valve  Continue daily exercise routine Call me if anything changes.   Return in about 1 year (around 01/01/2022), or if symptoms worsen or fail to improve.    Please do your part to reduce the spread of COVID-19.

## 2021-02-19 ENCOUNTER — Encounter (HOSPITAL_BASED_OUTPATIENT_CLINIC_OR_DEPARTMENT_OTHER): Payer: Self-pay | Admitting: *Deleted

## 2021-02-19 ENCOUNTER — Emergency Department (HOSPITAL_BASED_OUTPATIENT_CLINIC_OR_DEPARTMENT_OTHER): Payer: Medicare PPO

## 2021-02-19 ENCOUNTER — Other Ambulatory Visit: Payer: Self-pay

## 2021-02-19 ENCOUNTER — Emergency Department (HOSPITAL_BASED_OUTPATIENT_CLINIC_OR_DEPARTMENT_OTHER)
Admission: EM | Admit: 2021-02-19 | Discharge: 2021-02-19 | Disposition: A | Discharge status: Home / Self Care | Payer: Medicare PPO | Attending: Emergency Medicine | Admitting: Emergency Medicine

## 2021-02-19 DIAGNOSIS — Z7982 Long term (current) use of aspirin: Secondary | ICD-10-CM | POA: Diagnosis not present

## 2021-02-19 DIAGNOSIS — S0083XA Contusion of other part of head, initial encounter: Secondary | ICD-10-CM | POA: Diagnosis not present

## 2021-02-19 DIAGNOSIS — Z96611 Presence of right artificial shoulder joint: Secondary | ICD-10-CM | POA: Diagnosis not present

## 2021-02-19 DIAGNOSIS — S022XXA Fracture of nasal bones, initial encounter for closed fracture: Secondary | ICD-10-CM

## 2021-02-19 DIAGNOSIS — J449 Chronic obstructive pulmonary disease, unspecified: Secondary | ICD-10-CM | POA: Diagnosis not present

## 2021-02-19 DIAGNOSIS — Z79899 Other long term (current) drug therapy: Secondary | ICD-10-CM | POA: Diagnosis not present

## 2021-02-19 DIAGNOSIS — S0003XA Contusion of scalp, initial encounter: Secondary | ICD-10-CM | POA: Diagnosis not present

## 2021-02-19 DIAGNOSIS — Z87891 Personal history of nicotine dependence: Secondary | ICD-10-CM | POA: Insufficient documentation

## 2021-02-19 DIAGNOSIS — W01198A Fall on same level from slipping, tripping and stumbling with subsequent striking against other object, initial encounter: Secondary | ICD-10-CM | POA: Insufficient documentation

## 2021-02-19 DIAGNOSIS — Z853 Personal history of malignant neoplasm of breast: Secondary | ICD-10-CM | POA: Diagnosis not present

## 2021-02-19 DIAGNOSIS — Z7951 Long term (current) use of inhaled steroids: Secondary | ICD-10-CM | POA: Insufficient documentation

## 2021-02-19 DIAGNOSIS — Z96653 Presence of artificial knee joint, bilateral: Secondary | ICD-10-CM | POA: Insufficient documentation

## 2021-02-19 DIAGNOSIS — W19XXXA Unspecified fall, initial encounter: Secondary | ICD-10-CM

## 2021-02-19 DIAGNOSIS — I10 Essential (primary) hypertension: Secondary | ICD-10-CM | POA: Diagnosis not present

## 2021-02-19 DIAGNOSIS — S0990XA Unspecified injury of head, initial encounter: Secondary | ICD-10-CM | POA: Diagnosis not present

## 2021-02-19 DIAGNOSIS — M47812 Spondylosis without myelopathy or radiculopathy, cervical region: Secondary | ICD-10-CM | POA: Diagnosis not present

## 2021-02-19 DIAGNOSIS — M4802 Spinal stenosis, cervical region: Secondary | ICD-10-CM | POA: Diagnosis not present

## 2021-02-19 DIAGNOSIS — S0992XA Unspecified injury of nose, initial encounter: Secondary | ICD-10-CM | POA: Diagnosis present

## 2021-02-19 NOTE — ED Triage Notes (Signed)
Pt ws bent over helping a little boy with a dog when she tumbled over, abrasions to face and nose with small hematoma, to rt forehead and black eye. No OC, No thinners

## 2021-02-20 NOTE — ED Provider Notes (Signed)
[facility] EMERGENCY DEPT Provider Note   CSN: [id number] Arrival date & time: 02/19/21  1715     History Chief Complaint  Patient presents with   Fall   Head Injury    Miranda Thompson is a 87 y.o. female.   Fall Pertinent negatives include no chest pain, no abdominal pain, no headaches and no shortness of breath.  Head Injury Associated symptoms: no headaches   Patient presents after fall and head injury.  Was helping a little boy dog and fell over.  Hit her face.  Swelling and pain to her right eye area.  Some pain to her nose.  Abrasion.  No loss conscious.  Not on blood thinners.  No other injury.  No extremity injury.  No chest pain.    Past Medical History:  Diagnosis Date   Adenomatous colon polyp    Allergic rhinitis    Anemia    pt states no anemia in her past hx thst she is aware of    Atrial fibrillation ([place])    goes in and out of this rhythm- takes Tenormin   Breast cancer, left breast ([place]) 1994 with lumpectomy   chemo,tamox,radiation   Bronchiectasis with acute exacerbation ([place])    COPD (chronic obstructive pulmonary disease) ([place])    Diverticulosis of colon    DJD (degenerative joint disease)    Dysrhythmia    palpitations   Family history of brain cancer    Family history of colon cancer 02/14/2018   Family history of colon cancer    Family history of malignant neoplasm of gastrointestinal tract    Family history of melanoma    Family history of uterine cancer    Fibromyalgia    GERD (gastroesophageal reflux disease) 02/14/2018   Glaucoma    Hemoptysis    History of pneumonia    Hypercholesteremia    pt denies   Hypertension    Low back pain syndrome    Neuromuscular disorder ([place])    hx fibromyalgia    Osteopenia    Pneumonia    hx   Pseudomonas infection    Rotator cuff syndrome of right shoulder    Spinal stenosis     Patient Active Problem List   Diagnosis Date Noted   Low back pain 09/15/2020   Aortic  atherosclerosis ([place]) 09/11/2020   Submandibular gland mass 03/20/2020   Hyponatremia 03/20/2020   Hyperglycemia 02/20/2019   Genetic testing 11/11/2018   Family history of uterine cancer    Family history of brain cancer    Family history of melanoma    GERD (gastroesophageal reflux disease) 02/14/2018   Glaucoma 02/14/2018   Family history of colon cancer 02/14/2018   Encounter for well adult exam with abnormal findings 02/14/2018   Osteoporosis 02/14/2018   History of left breast cancer 01/13/2018   S/P knee replacement 03/23/2016   S/P shoulder replacement 08/02/2015   Palpitations 08/20/2014   Episode of hypertension 08/20/2014   COPD, moderate ([place]) 05/09/2014   Premature atrial contractions 11/08/2013   Cardiac arrhythmia 05/10/2013   Trochanteric bursitis of left hip 02/02/2012   Osteoarthritis of left hip 06/25/2011   Leg length inequality 06/25/2011   Left ovarian cyst 01/20/2011   Elevated lipids 11/04/2009   Acute cystitis 11/04/2009   LOW BACK PAIN SYNDROME 05/07/2008   Bronchiectasis with acute exacerbation ([place]) 03/18/2007   Hemoptysis 03/18/2007   PNEUMONIA 03/07/2007   Disorder of bone and cartilage 03/07/2007   BRONCHIECTASIS 03/04/2007   Osteoarthritis 03/04/2007  Past Surgical History:  Procedure Laterality Date   CATARACT EXTRACTION Right    COLONOSCOPY     left breast lumpectomy and LN dissection  06/1992   Dr. [name]   LUNG BIOPSY  1968   TSurg---Dr. [name]   LUNG BIOPSY  2012   dr nadel   POLYPECTOMY     REVERSE SHOULDER ARTHROPLASTY Right 08/02/2015   Procedure: RIGHT REVERSE TOTAL SHOULDER ARTHROPLASTY;  Surgeon: [name], MD;  Location: [facility];  Service: Orthopedics;  Laterality: Right;   REVERSE TOTAL SHOULDER ARTHROPLASTY Right 08/02/2015   TONSILLECTOMY     TOTAL KNEE ARTHROPLASTY Right 01/21/2015   Procedure: TOTAL RIGHT KNEE ARTHROPLASTY;  Surgeon: [name], MD;  Location: [facility];  Service: Orthopedics;  Laterality: Right;    TOTAL KNEE ARTHROPLASTY Left 03/23/2016   Procedure: LEFT TOTAL KNEE ARTHROPLASTY;  Surgeon: [name], MD;  Location: [facility];  Service: Orthopedics;  Laterality: Left;     OB History     Gravida  5   Para  5   Term  5   Preterm      AB      Living  5      SAB      IAB      Ectopic      Multiple      Live Births  5           Family History  Problem Relation Age of Onset   Heart attack Mother        Deceased age 38   Colon cancer Father 5       deceased age 28 from colon ca.   Hypertension Brother    Colon polyps Daughter    Uterine cancer Daughter 30   Brain cancer Maternal Uncle    Stroke Maternal Grandmother    Other Paternal Uncle 70       possible cancer   Melanoma Daughter 4   Melanoma Niece 45       d. 36   Esophageal cancer Neg Hx    Stomach cancer Neg Hx    Rectal cancer Neg Hx     Social History   Tobacco Use   Smoking status: Former    Packs/day: 0.25    Years: 40.00    Pack years: 10.00    Types: Cigarettes    Quit date: 02/24/1967    Years since quitting: 54.0   Smokeless tobacco: Never  Vaping Use   Vaping Use: Never used  Substance Use Topics   Alcohol use: Yes    Alcohol/week: 1.0 standard drink    Types: 1 Standard drinks or equivalent per week    Comment: occasionally   Drug use: No    Home Medications Prior to Admission medications   Medication Sig Start Date End Date Taking? Authorizing Provider  albuterol (VENTOLIN HFA) 108 (90 Base) MCG/ACT inhaler Inhale 2 puffs into the lungs every 6 (six) hours as needed for wheezing or shortness of breath. 01/01/21   Icard, Octavio Graves, DO  alendronate (FOSAMAX) 70 MG tablet Take 1 tablet (70 mg total) by mouth every 7 (seven) days. Take first thing in am with 6 oz. Water.  Be upright after taking.  Eat nothing for one hour. Patient not taking: Reported on 01/01/2021 12/02/20   Salvadore Dom, MD  amoxicillin (AMOXIL) 500 MG capsule Take prior to dental procedures 05/31/19    [provider]  ASPIRIN 81 PO Take 1 tablet by mouth daily.  [provider]  Calcium Carb-Cholecalciferol 600-800 MG-UNIT TABS Take 1 tablet by mouth daily.    [provider]  diltiazem (CARDIZEM CD) 120 MG 24 hr capsule Take 1 capsule (120 mg total) by mouth daily. 10/04/20   Josue Hector, MD  guaiFENesin (MUCINEX) 600 MG 12 hr tablet Take by mouth 2 (two) times daily.    [provider]  losartan (COZAAR) 25 MG tablet Take 0.5 tablets (12.5 mg total) by mouth daily. 08/27/20 11/25/20  Kathyrn Drown D, NP  LUMIGAN 0.01 % SOLN  04/15/18   [provider]  Multiple Vitamins-Minerals (MULTIVITAMIN & MINERAL PO) Take 1 tablet by mouth daily.    [provider]  Nutritional Supplements (VITAMIN D BOOSTER PO) Take by mouth.    [provider]  Respiratory Therapy Supplies (FLUTTER) DEVI 1 application by Does not apply route 3 (three) times daily. 06/22/18   Fenton Foy, NP  Saccharomyces boulardii (FLORASTOR PO) Take 1 capsule by mouth daily.    [provider]  Tiotropium Bromide-Olodaterol (STIOLTO RESPIMAT) 2.5-2.5 MCG/ACT AERS Inhale 2 puffs into the lungs daily. 05/17/20   Garner Nash, DO  Zinc 30 MG CAPS  05/12/17   [provider]    Allergies    Patient has no known allergies.  Review of Systems   Review of Systems  Constitutional:  Negative for appetite change.  Eyes:  Negative for pain.  Respiratory:  Negative for shortness of breath.   Cardiovascular:  Negative for chest pain.  Gastrointestinal:  Negative for abdominal pain.  Musculoskeletal:  Negative for back pain.  Neurological:  Negative for light-headedness and headaches.   Physical Exam Updated Vital Signs BP (!) 154/78 (BP Location: Right Arm)    Pulse 86    Temp 97.6 F (36.4 C) (Oral)    Resp 16    Ht 5' 3.5" (1.613 m)    Wt 59.9 kg    LMP 02/23/1985    SpO2 100%    BMI 23.02 kg/m   Physical Exam Vitals and nursing note  reviewed.  HENT:     Head: Normocephalic.     Comments: Bilateral TMs normal.  Eye movements intact.  Does have periorbital hematoma on the right.  Abrasion to bridge of nose with some tenderness.  Also abrasion on forehead. Eyes:     Pupils: Pupils are equal, round, and reactive to light.  Neck:     Comments: Some muscular tenderness.  No midline tenderness. Cardiovascular:     Rate and Rhythm: Regular rhythm.  Abdominal:     Tenderness: There is no abdominal tenderness.  Musculoskeletal:        General: No tenderness.     Cervical back: Neck supple.  Skin:    General: Skin is warm.     Capillary Refill: Capillary refill takes less than 2 seconds.  Neurological:     Mental Status: She is alert and oriented to person, place, and time.    ED Results / Procedures / Treatments   Labs (all labs ordered are listed, but only abnormal results are displayed) Labs Reviewed - No data to display  EKG None  Radiology CT Head Wo Contrast  Result Date: 02/19/2021 CLINICAL DATA:  Fall with abrasions to face and nodes EXAM: CT HEAD WITHOUT CONTRAST CT MAXILLOFACIAL WITHOUT CONTRAST CT CERVICAL SPINE WITHOUT CONTRAST TECHNIQUE: Multidetector CT imaging of the head, cervical spine, and maxillofacial structures were performed using the standard protocol without intravenous contrast. Multiplanar CT image reconstructions  of the cervical spine and maxillofacial structures were also generated. COMPARISON:  CT brain 05/05/2017, CT facial 01/02/2003, CT chest 12/14/2019 FINDINGS: CT HEAD FINDINGS Brain: No acute territorial infarction, hemorrhage, or intracranial mass. Similar cerebellar calcifications. Stable ventricle size. Mild atrophy. Vascular: No hyperdense vessels.  Carotid vascular calcification Skull: Normal. Negative for fracture or focal lesion. Other: Small right forehead scalp hematoma CT MAXILLOFACIAL FINDINGS Osseous: Mastoid air cells are clear. Mandibular heads are normally position. No  mandibular fracture. Pterygoid plates and zygomatic arches are intact. Minimal right nasal bone deformity consistent with fracture. Orbits: Negative. No traumatic or inflammatory finding. Sinuses: Clear. Soft tissues: Small right forehead scalp hematoma CT CERVICAL SPINE FINDINGS Alignment: Mild reversal of cervical lordosis. Trace anterolisthesis C3 on C4. Trace retrolisthesis C5 on C6. Facet alignment within normal limits. Skull base and vertebrae: Mild chronic superior endplate deformity at T3. remaining cervical vertebra demonstrate normal stature. Soft tissues and spinal canal: No prevertebral fluid or swelling. No visible canal hematoma. Disc levels: Multilevel degenerative changes. Advanced disc space narrowing and degenerative change C4 through C6. Facet degenerative changes at multiple levels with foraminal stenosis. Upper chest: Apical fibrosis and bronchiectasis. Other: None IMPRESSION: 1. No CT evidence for acute intracranial abnormality.  Mild atrophy 2. Minimal acute right nasal bone fracture 3. Mild reversal of cervical lordosis with degenerative changes. No acute fracture is seen. Electronically Signed   By: Donavan Foil M.D.   On: 02/19/2021 18:28   CT Cervical Spine Wo Contrast  Result Date: 02/19/2021 CLINICAL DATA:  Fall with abrasions to face and nodes EXAM: CT HEAD WITHOUT CONTRAST CT MAXILLOFACIAL WITHOUT CONTRAST CT CERVICAL SPINE WITHOUT CONTRAST TECHNIQUE: Multidetector CT imaging of the head, cervical spine, and maxillofacial structures were performed using the standard protocol without intravenous contrast. Multiplanar CT image reconstructions of the cervical spine and maxillofacial structures were also generated. COMPARISON:  CT brain 05/05/2017, CT facial 01/02/2003, CT chest 12/14/2019 FINDINGS: CT HEAD FINDINGS Brain: No acute territorial infarction, hemorrhage, or intracranial mass. Similar cerebellar calcifications. Stable ventricle size. Mild atrophy. Vascular: No hyperdense  vessels.  Carotid vascular calcification Skull: Normal. Negative for fracture or focal lesion. Other: Small right forehead scalp hematoma CT MAXILLOFACIAL FINDINGS Osseous: Mastoid air cells are clear. Mandibular heads are normally position. No mandibular fracture. Pterygoid plates and zygomatic arches are intact. Minimal right nasal bone deformity consistent with fracture. Orbits: Negative. No traumatic or inflammatory finding. Sinuses: Clear. Soft tissues: Small right forehead scalp hematoma CT CERVICAL SPINE FINDINGS Alignment: Mild reversal of cervical lordosis. Trace anterolisthesis C3 on C4. Trace retrolisthesis C5 on C6. Facet alignment within normal limits. Skull base and vertebrae: Mild chronic superior endplate deformity at T3. remaining cervical vertebra demonstrate normal stature. Soft tissues and spinal canal: No prevertebral fluid or swelling. No visible canal hematoma. Disc levels: Multilevel degenerative changes. Advanced disc space narrowing and degenerative change C4 through C6. Facet degenerative changes at multiple levels with foraminal stenosis. Upper chest: Apical fibrosis and bronchiectasis. Other: None IMPRESSION: 1. No CT evidence for acute intracranial abnormality.  Mild atrophy 2. Minimal acute right nasal bone fracture 3. Mild reversal of cervical lordosis with degenerative changes. No acute fracture is seen. Electronically Signed   By: Donavan Foil M.D.   On: 02/19/2021 18:28   CT Maxillofacial Wo Contrast  Result Date: 02/19/2021 CLINICAL DATA:  Fall with abrasions to face and nodes EXAM: CT HEAD WITHOUT CONTRAST CT MAXILLOFACIAL WITHOUT CONTRAST CT CERVICAL SPINE WITHOUT CONTRAST TECHNIQUE: Multidetector CT imaging of the head, cervical spine, and  maxillofacial structures were performed using the standard protocol without intravenous contrast. Multiplanar CT image reconstructions of the cervical spine and maxillofacial structures were also generated. COMPARISON:  CT brain  05/05/2017, CT facial 01/02/2003, CT chest 12/14/2019 FINDINGS: CT HEAD FINDINGS Brain: No acute territorial infarction, hemorrhage, or intracranial mass. Similar cerebellar calcifications. Stable ventricle size. Mild atrophy. Vascular: No hyperdense vessels.  Carotid vascular calcification Skull: Normal. Negative for fracture or focal lesion. Other: Small right forehead scalp hematoma CT MAXILLOFACIAL FINDINGS Osseous: Mastoid air cells are clear. Mandibular heads are normally position. No mandibular fracture. Pterygoid plates and zygomatic arches are intact. Minimal right nasal bone deformity consistent with fracture. Orbits: Negative. No traumatic or inflammatory finding. Sinuses: Clear. Soft tissues: Small right forehead scalp hematoma CT CERVICAL SPINE FINDINGS Alignment: Mild reversal of cervical lordosis. Trace anterolisthesis C3 on C4. Trace retrolisthesis C5 on C6. Facet alignment within normal limits. Skull base and vertebrae: Mild chronic superior endplate deformity at T3. remaining cervical vertebra demonstrate normal stature. Soft tissues and spinal canal: No prevertebral fluid or swelling. No visible canal hematoma. Disc levels: Multilevel degenerative changes. Advanced disc space narrowing and degenerative change C4 through C6. Facet degenerative changes at multiple levels with foraminal stenosis. Upper chest: Apical fibrosis and bronchiectasis. Other: None IMPRESSION: 1. No CT evidence for acute intracranial abnormality.  Mild atrophy 2. Minimal acute right nasal bone fracture 3. Mild reversal of cervical lordosis with degenerative changes. No acute fracture is seen. Electronically Signed   By: Donavan Foil M.D.   On: 02/19/2021 18:28    Procedures Procedures   Medications Ordered in ED Medications - No data to display  ED Course  I have reviewed the triage vital signs and the nursing notes.  Pertinent labs & imaging results that were available during my care of the patient were reviewed  by me and considered in my medical decision making (see chart for details).    MDM Rules/Calculators/A&P                         Patient with fall.  Hit face.  No loss conscious.  No blood thinners.  Imaging of head face and neck overall reassuring.  Does have small nasal bone fracture can be followed as an outpatient.  Will discharge home.  No other apparent injury    Final Clinical Impression(s) / ED Diagnoses Final diagnoses:  Fall, initial encounter  Facial contusion, initial encounter  Closed fracture of nasal bone, initial encounter  Minor head injury, initial encounter    Rx / DC Orders ED Discharge Orders     None        Davonna Belling, MD 02/21/21 0000

## 2021-03-17 ENCOUNTER — Encounter: Payer: Self-pay | Admitting: Pulmonary Disease

## 2021-03-19 DIAGNOSIS — H04123 Dry eye syndrome of bilateral lacrimal glands: Secondary | ICD-10-CM | POA: Diagnosis not present

## 2021-03-19 DIAGNOSIS — H401131 Primary open-angle glaucoma, bilateral, mild stage: Secondary | ICD-10-CM | POA: Diagnosis not present

## 2021-03-19 MED ORDER — STIOLTO RESPIMAT 2.5-2.5 MCG/ACT IN AERS: 2.0000 | INHALATION_SPRAY | Freq: Every day | RESPIRATORY_TRACT | 7 refills | Status: DC

## 2021-03-24 ENCOUNTER — Other Ambulatory Visit: Payer: Self-pay | Admitting: Internal Medicine

## 2021-03-24 ENCOUNTER — Other Ambulatory Visit (INDEPENDENT_AMBULATORY_CARE_PROVIDER_SITE_OTHER): Payer: Medicare PPO

## 2021-03-24 ENCOUNTER — Other Ambulatory Visit: Payer: Self-pay

## 2021-03-24 DIAGNOSIS — E538 Deficiency of other specified B group vitamins: Secondary | ICD-10-CM

## 2021-03-24 DIAGNOSIS — R739 Hyperglycemia, unspecified: Secondary | ICD-10-CM

## 2021-03-24 DIAGNOSIS — E7800 Pure hypercholesterolemia, unspecified: Secondary | ICD-10-CM

## 2021-03-24 DIAGNOSIS — E559 Vitamin D deficiency, unspecified: Secondary | ICD-10-CM | POA: Diagnosis not present

## 2021-03-24 LAB — URINALYSIS, ROUTINE W REFLEX MICROSCOPIC
Bilirubin Urine: NEGATIVE
Ketones, ur: NEGATIVE
Nitrite: POSITIVE — AB
Specific Gravity, Urine: 1.015 (ref 1.000–1.030)
Total Protein, Urine: NEGATIVE
Urine Glucose: NEGATIVE
Urobilinogen, UA: 0.2 (ref 0.0–1.0)
pH: 7 (ref 5.0–8.0)

## 2021-03-24 LAB — LIPID PANEL
Cholesterol: 158 mg/dL (ref 0–200)
HDL: 60.7 mg/dL (ref >39.00)
LDL Cholesterol: 87 mg/dL (ref 0–99)
NonHDL: 97.03
Total CHOL/HDL Ratio: 3
Triglycerides: 51 mg/dL (ref 0.0–149.0)
VLDL: 10.2 mg/dL (ref 0.0–40.0)

## 2021-03-24 LAB — BASIC METABOLIC PANEL
BUN: 15 mg/dL (ref 6–23)
CO2: 29 mEq/L (ref 19–32)
Calcium: 9.3 mg/dL (ref 8.4–10.5)
Chloride: 99 mEq/L (ref 96–112)
Creatinine, Ser: 0.65 mg/dL (ref 0.40–1.20)
GFR: 80.04 mL/min (ref >60.00)
Glucose, Bld: 95 mg/dL (ref 70–99)
Potassium: 4.3 mEq/L (ref 3.5–5.1)
Sodium: 135 mEq/L (ref 135–145)

## 2021-03-24 LAB — CBC WITH DIFFERENTIAL/PLATELET
Basophils Absolute: 0.1 10*3/uL (ref 0.0–0.1)
Basophils Relative: 1.3 % (ref 0.0–3.0)
Eosinophils Absolute: 0.2 10*3/uL (ref 0.0–0.7)
Eosinophils Relative: 2.9 % (ref 0.0–5.0)
HCT: 37.2 % (ref 36.0–46.0)
Hemoglobin: 12.6 g/dL (ref 12.0–15.0)
Lymphocytes Relative: 22.5 % (ref 12.0–46.0)
Lymphs Abs: 1.2 10*3/uL (ref 0.7–4.0)
MCHC: 33.7 g/dL (ref 30.0–36.0)
MCV: 98.2 fl (ref 78.0–100.0)
Monocytes Absolute: 0.5 10*3/uL (ref 0.1–1.0)
Monocytes Relative: 9.2 % (ref 3.0–12.0)
Neutro Abs: 3.5 10*3/uL (ref 1.4–7.7)
Neutrophils Relative %: 64.1 % (ref 43.0–77.0)
Platelets: 237 10*3/uL (ref 150.0–400.0)
RBC: 3.79 Mil/uL — ABNORMAL LOW (ref 3.87–5.11)
RDW: 12.9 % (ref 11.5–15.5)
WBC: 5.4 10*3/uL (ref 4.0–10.5)

## 2021-03-24 LAB — HEMOGLOBIN A1C: Hgb A1c MFr Bld: 5.9 % (ref 4.6–6.5)

## 2021-03-24 LAB — HEPATIC FUNCTION PANEL
ALT: 15 U/L (ref 0–35)
AST: 25 U/L (ref 0–37)
Albumin: 3.9 g/dL (ref 3.5–5.2)
Alkaline Phosphatase: 65 U/L (ref 39–117)
Bilirubin, Direct: 0.1 mg/dL (ref 0.0–0.3)
Total Bilirubin: 0.6 mg/dL (ref 0.2–1.2)
Total Protein: 7.4 g/dL (ref 6.0–8.3)

## 2021-03-24 LAB — VITAMIN D 25 HYDROXY (VIT D DEFICIENCY, FRACTURES): VITD: 53.81 ng/mL (ref 30.00–100.00)

## 2021-03-24 LAB — VITAMIN B12: Vitamin B-12: 859 pg/mL (ref 211–911)

## 2021-03-24 LAB — TSH: TSH: 2.92 u[IU]/mL (ref 0.35–5.50)

## 2021-03-26 ENCOUNTER — Encounter: Payer: Self-pay | Admitting: Internal Medicine

## 2021-03-26 ENCOUNTER — Ambulatory Visit (INDEPENDENT_AMBULATORY_CARE_PROVIDER_SITE_OTHER): Payer: Medicare PPO | Admitting: Internal Medicine

## 2021-03-26 ENCOUNTER — Ambulatory Visit (INDEPENDENT_AMBULATORY_CARE_PROVIDER_SITE_OTHER): Payer: Medicare PPO

## 2021-03-26 ENCOUNTER — Other Ambulatory Visit: Payer: Self-pay

## 2021-03-26 ENCOUNTER — Encounter: Payer: Self-pay | Admitting: Obstetrics and Gynecology

## 2021-03-26 VITALS — BP 118/64 | HR 82 | Temp 97.7°F | Ht 64.0 in | Wt 132.4 lb

## 2021-03-26 VITALS — BP 118/60 | HR 86 | Temp 98.1°F | Ht 64.0 in | Wt 131.6 lb

## 2021-03-26 DIAGNOSIS — I10 Essential (primary) hypertension: Secondary | ICD-10-CM | POA: Diagnosis not present

## 2021-03-26 DIAGNOSIS — R739 Hyperglycemia, unspecified: Secondary | ICD-10-CM

## 2021-03-26 DIAGNOSIS — E559 Vitamin D deficiency, unspecified: Secondary | ICD-10-CM | POA: Diagnosis not present

## 2021-03-26 DIAGNOSIS — Z0000 Encounter for general adult medical examination without abnormal findings: Secondary | ICD-10-CM

## 2021-03-26 DIAGNOSIS — I700 Atherosclerosis of aorta: Secondary | ICD-10-CM

## 2021-03-26 DIAGNOSIS — N3000 Acute cystitis without hematuria: Secondary | ICD-10-CM | POA: Diagnosis not present

## 2021-03-26 DIAGNOSIS — Z0001 Encounter for general adult medical examination with abnormal findings: Secondary | ICD-10-CM | POA: Diagnosis not present

## 2021-03-26 DIAGNOSIS — E785 Hyperlipidemia, unspecified: Secondary | ICD-10-CM

## 2021-03-26 DIAGNOSIS — E538 Deficiency of other specified B group vitamins: Secondary | ICD-10-CM

## 2021-03-26 MED ORDER — CIPROFLOXACIN HCL 500 MG PO TABS: 500.0000 mg | ORAL_TABLET | Freq: Two times a day (BID) | ORAL | 0 refills | Status: AC

## 2021-03-26 NOTE — Patient Instructions (Signed)
Miranda Thompson , Thank you for taking time to come for your Medicare Wellness Visit. I appreciate your ongoing commitment to your health goals. Please review the following plan we discussed and let me know if I can assist you in the future.   Screening recommendations/referrals: Colonoscopy: last done 08/08/2014; no repeat due to age Mammogram: 10/16/2020; due every 1 year (due 10/16/2021) Bone Density: 10/30/2020; due every 2 years (due 10/31/2022) Recommended yearly ophthalmology/optometry visit for glaucoma screening and checkup Recommended yearly dental visit for hygiene and checkup  Vaccinations: Influenza vaccine: 11/14/2020; due every Fall season Pneumococcal vaccine: 11/07/2007, 05/09/2014 Tdap vaccine: 05/09/2014; due every 10 years (due 05/08/2024) Shingles vaccine: 04/10/2018, 01/08/2019   Covid-19: 03/14/2019, 04/04/2019, 11/18/2019, 05/27/2020  Advanced directives: Please bring a copy of your health care power of attorney and living will to the office at your convenience.  Conditions/risks identified: Yes; My goal is to stay healthy and do not fall.  Next appointment: March 27, 2022 at 2:20 pm at [facility] with Lisette Abu, LPN.   Preventive Care 55 Years and Older, Female Preventive care refers to lifestyle choices and visits with your health care provider that can promote health and wellness. What does preventive care include? A yearly physical exam. This is also called an annual well check. Dental exams once or twice a year. Routine eye exams. Ask your health care provider how often you should have your eyes checked. Personal lifestyle choices, including: Daily care of your teeth and gums. Regular physical activity. Eating a healthy diet. Avoiding tobacco and drug use. Limiting alcohol use. Practicing safe sex. Taking low-dose aspirin every day. Taking vitamin and mineral supplements as recommended by your health care provider. What happens during an  annual well check? The services and screenings done by your health care provider during your annual well check will depend on your age, overall health, lifestyle risk factors, and family history of disease. Counseling  Your health care provider may ask you questions about your: Alcohol use. Tobacco use. Drug use. Emotional well-being. Home and relationship well-being. Sexual activity. Eating habits. History of falls. Memory and ability to understand (cognition). Work and work Statistician. Reproductive health. Screening  You may have the following tests or measurements: Height, weight, and BMI. Blood pressure. Lipid and cholesterol levels. These may be checked every 5 years, or more frequently if you are over 11 years old. Skin check. Lung cancer screening. You may have this screening every year starting at age 56 if you have a 30-pack-year history of smoking and currently smoke or have quit within the past 15 years. Fecal occult blood test (FOBT) of the stool. You may have this test every year starting at age 54. Flexible sigmoidoscopy or colonoscopy. You may have a sigmoidoscopy every 5 years or a colonoscopy every 10 years starting at age 29. Hepatitis C blood test. Hepatitis B blood test. Sexually transmitted disease (STD) testing. Diabetes screening. This is done by checking your blood sugar (glucose) after you have not eaten for a while (fasting). You may have this done every 1-3 years. Bone density scan. This is done to screen for osteoporosis. You may have this done starting at age 36. Mammogram. This may be done every 1-2 years. Talk to your health care provider about how often you should have regular mammograms. Talk with your health care provider about your test results, treatment options, and if necessary, the need for more tests. Vaccines  Your health care provider may recommend certain vaccines, such as:  Influenza vaccine. This is recommended every year. Tetanus,  diphtheria, and acellular pertussis (Tdap, Td) vaccine. You may need a Td booster every 10 years. Zoster vaccine. You may need this after age 40. Pneumococcal 13-valent conjugate (PCV13) vaccine. One dose is recommended after age 36. Pneumococcal polysaccharide (PPSV23) vaccine. One dose is recommended after age 1. Talk to your health care provider about which screenings and vaccines you need and how often you need them. This information is not intended to replace advice given to you by your health care provider. Make sure you discuss any questions you have with your health care provider. Document Released: 03/08/2015 Document Revised: 10/30/2015 Document Reviewed: 12/11/2014 Elsevier Interactive Patient Education  2017 [place] Prevention in the Home Falls can cause injuries. They can happen to people of all ages. There are many things you can do to make your home safe and to help prevent falls. What can I do on the outside of my home? Regularly fix the edges of walkways and driveways and fix any cracks. Remove anything that might make you trip as you walk through a door, such as a raised step or threshold. Trim any bushes or trees on the path to your home. Use bright outdoor lighting. Clear any walking paths of anything that might make someone trip, such as rocks or tools. Regularly check to see if handrails are loose or broken. Make sure that both sides of any steps have handrails. Any raised decks and porches should have guardrails on the edges. Have any leaves, snow, or ice cleared regularly. Use sand or salt on walking paths during winter. Clean up any spills in your garage right away. This includes oil or grease spills. What can I do in the bathroom? Use night lights. Install grab bars by the toilet and in the tub and shower. Do not use towel bars as grab bars. Use non-skid mats or decals in the tub or shower. If you need to sit down in the shower, use a plastic,  non-slip stool. Keep the floor dry. Clean up any water that spills on the floor as soon as it happens. Remove soap buildup in the tub or shower regularly. Attach bath mats securely with double-sided non-slip rug tape. Do not have throw rugs and other things on the floor that can make you trip. What can I do in the bedroom? Use night lights. Make sure that you have a light by your bed that is easy to reach. Do not use any sheets or blankets that are too big for your bed. They should not hang down onto the floor. Have a firm chair that has side arms. You can use this for support while you get dressed. Do not have throw rugs and other things on the floor that can make you trip. What can I do in the kitchen? Clean up any spills right away. Avoid walking on wet floors. Keep items that you use a lot in easy-to-reach places. If you need to reach something above you, use a strong step stool that has a grab bar. Keep electrical cords out of the way. Do not use floor polish or wax that makes floors slippery. If you must use wax, use non-skid floor wax. Do not have throw rugs and other things on the floor that can make you trip. What can I do with my stairs? Do not leave any items on the stairs. Make sure that there are handrails on both sides of the stairs and use them.  Fix handrails that are broken or loose. Make sure that handrails are as long as the stairways. Check any carpeting to make sure that it is firmly attached to the stairs. Fix any carpet that is loose or worn. Avoid having throw rugs at the top or bottom of the stairs. If you do have throw rugs, attach them to the floor with carpet tape. Make sure that you have a light switch at the top of the stairs and the bottom of the stairs. If you do not have them, ask someone to add them for you. What else can I do to help prevent falls? Wear shoes that: Do not have high heels. Have rubber bottoms. Are comfortable and fit you well. Are closed  at the toe. Do not wear sandals. If you use a stepladder: Make sure that it is fully opened. Do not climb a closed stepladder. Make sure that both sides of the stepladder are locked into place. Ask someone to hold it for you, if possible. Clearly mark and make sure that you can see: Any grab bars or handrails. First and last steps. Where the edge of each step is. Use tools that help you move around (mobility aids) if they are needed. These include: Canes. Walkers. Scooters. Crutches. Turn on the lights when you go into a dark area. Replace any light bulbs as soon as they burn out. Set up your furniture so you have a clear path. Avoid moving your furniture around. If any of your floors are uneven, fix them. If there are any pets around you, be aware of where they are. Review your medicines with your doctor. Some medicines can make you feel dizzy. This can increase your chance of falling. Ask your doctor what other things that you can do to help prevent falls. This information is not intended to replace advice given to you by your health care provider. Make sure you discuss any questions you have with your health care provider. Document Released: 12/06/2008 Document Revised: 07/18/2015 Document Reviewed: 03/16/2014 Elsevier Interactive Patient Education  2017 [employer].

## 2021-03-26 NOTE — Progress Notes (Signed)
Patient ID: Miranda Thompson, female   DOB: Sep 20, 1933, 88 y.o.   MRN: [id number]         Chief Complaint:: wellness exam and Follow-up (Go over UA results. )         HPI:  Miranda Thompson is a 88 y.o. female here for wellness exam; declines covid booster, colonoscopy o/w up to date                        Also c/o 2 -3 days onset mild to mod urinary symptoms with dysuria, frequency, but no urgency, flank pain, hematuria or n/v, fever, chills.  Pt denies chest pain, increased sob or doe, wheezing, orthopnea, PND, increased LE swelling, palpitations, dizziness or syncope.   Pt denies polydipsia, polyuria, or new focal neuro s/s.   Pt denies fever, wt loss, night sweats, loss of appetite, or other constitutional symptoms  Denies worsening depressive symptoms, suicidal ideation, or panic   Wt Readings from Last 3 Encounters:  03/26/21 132 lb 6.4 oz (60.1 kg)  03/26/21 131 lb 9.6 oz (59.7 kg)  02/19/21 132 lb (59.9 kg)   BP Readings from Last 3 Encounters:  03/28/21 124/80  03/26/21 118/64  03/26/21 118/60   Immunization History  Administered Date(s) Administered   Fluad Quad(high Dose 65+) 11/08/2019, 11/14/2020   Influenza Split 12/03/2011   Influenza Whole 11/30/2007, 11/30/2008, 12/03/2009, 11/24/2010   Influenza, High Dose Seasonal PF 11/19/2015, 12/02/2016, 12/17/2017, 11/22/2018   Influenza,inj,Quad PF,6+ Mos 11/10/2012, 11/08/2013, 11/06/2014   Influenza-Unspecified 12/02/2016, 11/22/2018   PFIZER Comirnaty(Gray Top)Covid-19 Tri-Sucrose Vaccine 05/27/2020   PFIZER(Purple Top)SARS-COV-2 Vaccination 03/14/2019, 04/04/2019, 11/18/2019   Pneumococcal Conjugate-13 05/09/2014   Pneumococcal Polysaccharide-23 11/07/2007   Tdap 05/09/2014   Zoster Recombinat (Shingrix) 04/10/2018, 01/08/2019   Zoster, Live 12/11/2009  There are no preventive care reminders to display for this patient.    Past Medical History:  Diagnosis Date   Adenomatous colon polyp    Allergic rhinitis     Anemia    pt states no anemia in her past hx thst she is aware of    Atrial fibrillation ([place])    goes in and out of this rhythm- takes Tenormin   Breast cancer, left breast ([place]) 1994 with lumpectomy   chemo,tamox,radiation   Bronchiectasis with acute exacerbation (HCC)    COPD (chronic obstructive pulmonary disease) (HCC)    Diverticulosis of colon    DJD (degenerative joint disease)    Dysrhythmia    palpitations   Family history of brain cancer    Family history of colon cancer 02/14/2018   Family history of colon cancer    Family history of malignant neoplasm of gastrointestinal tract    Family history of melanoma    Family history of uterine cancer    Fibromyalgia    GERD (gastroesophageal reflux disease) 02/14/2018   Glaucoma    Hemoptysis    History of pneumonia    Hypercholesteremia    pt denies   Hypertension    Low back pain syndrome    Neuromuscular disorder (HCC)    hx fibromyalgia    Osteopenia    Pneumonia    hx   Pseudomonas infection    Rotator cuff syndrome of right shoulder    Spinal stenosis    Past Surgical History:  Procedure Laterality Date   CATARACT EXTRACTION Right    COLONOSCOPY     left breast lumpectomy and LN dissection  06/1992   Dr. [name]  LUNG BIOPSY  1968   TSurg---Dr. [name]   LUNG BIOPSY  2012   dr nadel   POLYPECTOMY     REVERSE SHOULDER ARTHROPLASTY Right 08/02/2015   Procedure: RIGHT REVERSE TOTAL SHOULDER ARTHROPLASTY;  Surgeon: [name], MD;  Location: [facility];  Service: Orthopedics;  Laterality: Right;   REVERSE TOTAL SHOULDER ARTHROPLASTY Right 08/02/2015   TONSILLECTOMY     TOTAL KNEE ARTHROPLASTY Right 01/21/2015   Procedure: TOTAL RIGHT KNEE ARTHROPLASTY;  Surgeon: [name], MD;  Location: [facility];  Service: Orthopedics;  Laterality: Right;   TOTAL KNEE ARTHROPLASTY Left 03/23/2016   Procedure: LEFT TOTAL KNEE ARTHROPLASTY;  Surgeon: [name], MD;  Location: [facility];  Service: Orthopedics;  Laterality: Left;     reports that she quit smoking about 54 years ago. Her smoking use included cigarettes. She has a 10.00 pack-year smoking history. She has never used smokeless tobacco. She reports current alcohol use of about 1.0 standard drink per week. She reports that she does not use drugs. family history includes Brain cancer in her maternal uncle; Colon cancer (age of onset: 49) in her father; Colon polyps in her daughter; Heart attack in her mother; Hypertension in her brother; Melanoma (age of onset: 21) in her niece; Melanoma (age of onset: 22) in her daughter; Other (age of onset: 78) in her paternal uncle; Stroke in her maternal grandmother; Uterine cancer (age of onset: 23) in her daughter. No Known Allergies Current Outpatient Medications on File Prior to Visit  Medication Sig Dispense Refill   albuterol (VENTOLIN HFA) 108 (90 Base) MCG/ACT inhaler Inhale 2 puffs into the lungs every 6 (six) hours as needed for wheezing or shortness of breath. 8 g 6   amoxicillin (AMOXIL) 500 MG capsule Take prior to dental procedures (Patient not taking: Reported on 03/28/2021)     ASPIRIN 81 PO Take 1 tablet by mouth daily.     Calcium Carb-Cholecalciferol 600-800 MG-UNIT TABS Take 1 tablet by mouth daily.     diltiazem (CARDIZEM CD) 120 MG 24 hr capsule Take 1 capsule (120 mg total) by mouth daily. 90 capsule 3   guaiFENesin (MUCINEX) 600 MG 12 hr tablet Take by mouth 2 (two) times daily.     LUMIGAN 0.01 % SOLN      Multiple Vitamins-Minerals (MULTIVITAMIN & MINERAL PO) Take 1 tablet by mouth daily.     Nutritional Supplements (VITAMIN D BOOSTER PO) Take by mouth.     Respiratory Therapy Supplies (FLUTTER) DEVI 1 application by Does not apply route 3 (three) times daily. 1 each 0   Saccharomyces boulardii (FLORASTOR PO) Take 1 capsule by mouth daily.     Tiotropium Bromide-Olodaterol (STIOLTO RESPIMAT) 2.5-2.5 MCG/ACT AERS Inhale 2 puffs into the lungs daily. 4 g 7   Zinc 30 MG CAPS      losartan (COZAAR) 25 MG  tablet Take 0.5 tablets (12.5 mg total) by mouth daily. 45 tablet 3   No current facility-administered medications on file prior to visit.        ROS:  All others reviewed and negative.  Objective        PE:  BP 118/64    Pulse 82    Temp 97.7 F (36.5 C) (Oral)    Ht 5\' 4"  (1.626 m)    Wt 132 lb 6.4 oz (60.1 kg)    LMP 02/23/1985    SpO2 98%    BMI 22.73 kg/m  Constitutional: Pt appears in NAD               HENT: Head: NCAT.                Right Ear: External ear normal.                 Left Ear: External ear normal.                Eyes: . Pupils are equal, round, and reactive to light. Conjunctivae and EOM are normal               Nose: without d/c or deformity               Neck: Neck supple. Gross normal ROM               Cardiovascular: Normal rate and regular rhythm.                 Pulmonary/Chest: Effort normal and breath sounds without rales or wheezing.                Abd:  Soft, mild low mid abd tender, ND, + BS, no organomegaly               Neurological: Pt is alert. At baseline orientation, motor grossly intact               Skin: Skin is warm. No rashes, no other new lesions, LE edema - none               Psychiatric: Pt behavior is normal without agitation   Micro: none  Cardiac tracings I have personally interpreted today:  none  Pertinent Radiological findings (summarize): none   Lab Results  Component Value Date   WBC 5.4 03/24/2021   HGB 12.6 03/24/2021   HCT 37.2 03/24/2021   PLT 237.0 03/24/2021   GLUCOSE 95 03/24/2021   CHOL 158 03/24/2021   TRIG 51.0 03/24/2021   HDL 60.70 03/24/2021   LDLCALC 87 03/24/2021   ALT 15 03/24/2021   AST 25 03/24/2021   NA 135 03/24/2021   K 4.3 03/24/2021   CL 99 03/24/2021   CREATININE 0.65 03/24/2021   BUN 15 03/24/2021   CO2 29 03/24/2021   TSH 2.92 03/24/2021   INR 1.06 01/14/2015   HGBA1C 5.9 03/24/2021   Assessment/Plan:  Miranda Thompson is a 88 y.o. White or Caucasian [1] female with   has a past medical history of Adenomatous colon polyp, Allergic rhinitis, Anemia, Atrial fibrillation ([place]), Breast cancer, left breast ([place]) (1994 with lumpectomy), Bronchiectasis with acute exacerbation ([place]), COPD (chronic obstructive pulmonary disease) ([place]), Diverticulosis of colon, DJD (degenerative joint disease), Dysrhythmia, Family history of brain cancer, Family history of colon cancer (02/14/2018), Family history of colon cancer, Family history of malignant neoplasm of gastrointestinal tract, Family history of melanoma, Family history of uterine cancer, Fibromyalgia, GERD (gastroesophageal reflux disease) (02/14/2018), Glaucoma, Hemoptysis, History of pneumonia, Hypercholesteremia, Hypertension, Low back pain syndrome, Neuromuscular disorder ([place]), Osteopenia, Pneumonia, Pseudomonas infection, Rotator cuff syndrome of right shoulder, and Spinal stenosis.  Encounter for well adult exam with abnormal findings Age and sex appropriate education and counseling updated with regular exercise and diet Referrals for preventative services - declines colonosocopy Immunizations addressed - declines covid booster Smoking counseling  - none needed Evidence for depression or other mood disorder - none significant Most recent labs reviewed. I have personally reviewed and have noted: 1) the patient's medical  and social history 2) The patient's current medications and supplements 3) The patient's height, weight, and BMI have been recorded in the chart   Aortic atherosclerosis [facility]) Lab Results  Component Value Date   LDLCALC 87 03/24/2021  pt to continue low chol diet, excericse, declines statin  Elevated lipids Lab Results  Component Value Date   LDLCALC 87 03/24/2021   Mild uncontrolled, goal ldl < 70,, pt to continue current low chol diet, declines statin   Episode of hypertension BP Readings from Last 3 Encounters:  03/28/21 124/80  03/26/21 118/64  03/26/21 118/60   Stable, pt to  continue medical treatment cardizem, losartan   Hyperglycemia Lab Results  Component Value Date   HGBA1C 5.9 03/24/2021   Stable, pt to continue current medical treatment  - diet   Acute cystitis Mild to mod, for urine culture, and cipro course asd,  to f/u any worsening symptoms or concerns  Followup: Return in about 1 year (around 03/26/2022).  Cathlean Cower, MD 03/30/2021 7:15 AM [facility]

## 2021-03-26 NOTE — Patient Instructions (Signed)
Please take all new medication as prescribed - the cipro  Please continue all other medications as before, and refills have been done if requested.  Please have the pharmacy call with any other refills you may need.  Please continue your efforts at being more active, low cholesterol diet, and weight control.  You are otherwise up to date with prevention measures today.  Please keep your appointments with your specialists as you may have planned  Please make an Appointment to return for your 1 year visit, or sooner if needed, with Lab testing by Appointment as well, to be done about 3-5 days before at the [facility] (so this is for TWO appointments - please see the scheduling desk as you leave)   Due to the ongoing Covid 19 pandemic, our lab now requires an appointment for any labs done at our office.  If you need labs done and do not have an appointment, please call our office ahead of time to schedule before presenting to the lab for your testing.

## 2021-03-26 NOTE — Progress Notes (Addendum)
Subjective:   Miranda Thompson is a 88 y.o. female who presents for Medicare Annual (Subsequent) preventive examination.  Review of Systems     Cardiac Risk Factors include: advanced age (>66men, >37 women);family history of premature cardiovascular disease;hypertension     Objective:    Today's Vitals   03/26/21 1456  BP: 118/60  Pulse: 86  Temp: 98.1 F (36.7 C)  SpO2: 97%  Weight: 131 lb 9.6 oz (59.7 kg)  Height: 5\' 4"  (1.626 m)  PainSc: 0-No pain   Body mass index is 22.59 kg/m.  Advanced Directives 03/26/2021 02/19/2021 04/16/2020 03/21/2020 10/27/2016 10/11/2016 03/24/2016  Does Patient Have a Medical Advance Directive? Yes Yes Yes Yes Yes Yes Yes  Type of Advance Directive Living will;Healthcare Power of [place];Living will Living will;Healthcare Power of Attorney Living will;Healthcare Power of [place];Living will [place];Living will [place];Living will  Does patient want to make changes to medical advance directive? No - Patient declined - No - Patient declined No - Patient declined - - No - Patient declined  Copy of [place] in Chart? No - copy requested - No - copy requested No - copy requested - - -    Current Medications (verified) Outpatient Encounter Medications as of 03/26/2021  Medication Sig   albuterol (VENTOLIN HFA) 108 (90 Base) MCG/ACT inhaler Inhale 2 puffs into the lungs every 6 (six) hours as needed for wheezing or shortness of breath.   amoxicillin (AMOXIL) 500 MG capsule Take prior to dental procedures   ASPIRIN 81 PO Take 1 tablet by mouth daily.   Calcium Carb-Cholecalciferol 600-800 MG-UNIT TABS Take 1 tablet by mouth daily.   diltiazem (CARDIZEM CD) 120 MG 24 hr capsule Take 1 capsule (120 mg total) by mouth daily.   guaiFENesin (MUCINEX) 600 MG 12 hr tablet Take by mouth 2 (two) times daily.   LUMIGAN 0.01 % SOLN    Multiple  Vitamins-Minerals (MULTIVITAMIN & MINERAL PO) Take 1 tablet by mouth daily.   Nutritional Supplements (VITAMIN D BOOSTER PO) Take by mouth.   Respiratory Therapy Supplies (FLUTTER) DEVI 1 application by Does not apply route 3 (three) times daily.   Saccharomyces boulardii (FLORASTOR PO) Take 1 capsule by mouth daily.   Tiotropium Bromide-Olodaterol (STIOLTO RESPIMAT) 2.5-2.5 MCG/ACT AERS Inhale 2 puffs into the lungs daily.   Zinc 30 MG CAPS    losartan (COZAAR) 25 MG tablet Take 0.5 tablets (12.5 mg total) by mouth daily.   [DISCONTINUED] alendronate (FOSAMAX) 70 MG tablet Take 1 tablet (70 mg total) by mouth every 7 (seven) days. Take first thing in am with 6 oz. Water.  Be upright after taking.  Eat nothing for one hour. (Patient not taking: Reported on 03/26/2021)   No facility-administered encounter medications on file as of 03/26/2021.    Allergies (verified) Patient has no known allergies.   History: Past Medical History:  Diagnosis Date   Adenomatous colon polyp    Allergic rhinitis    Anemia    pt states no anemia in her past hx thst she is aware of    Atrial fibrillation ([place])    goes in and out of this rhythm- takes Tenormin   Breast cancer, left breast ([place]) 1994 with lumpectomy   chemo,tamox,radiation   Bronchiectasis with acute exacerbation (HCC)    COPD (chronic obstructive pulmonary disease) (HCC)    Diverticulosis of colon    DJD (degenerative joint disease)  Dysrhythmia    palpitations   Family history of brain cancer    Family history of colon cancer 02/14/2018   Family history of colon cancer    Family history of malignant neoplasm of gastrointestinal tract    Family history of melanoma    Family history of uterine cancer    Fibromyalgia    GERD (gastroesophageal reflux disease) 02/14/2018   Glaucoma    Hemoptysis    History of pneumonia    Hypercholesteremia    pt denies   Hypertension    Low back pain syndrome    Neuromuscular disorder ([place])    hx  fibromyalgia    Osteopenia    Pneumonia    hx   Pseudomonas infection    Rotator cuff syndrome of right shoulder    Spinal stenosis    Past Surgical History:  Procedure Laterality Date   CATARACT EXTRACTION Right    COLONOSCOPY     left breast lumpectomy and LN dissection  06/1992   Dr. [name]   LUNG BIOPSY  1968   TSurg---Dr. [name]   LUNG BIOPSY  2012   dr [name]   POLYPECTOMY     REVERSE SHOULDER ARTHROPLASTY Right 08/02/2015   Procedure: RIGHT REVERSE TOTAL SHOULDER ARTHROPLASTY;  Surgeon: [name], MD;  Location: [facility];  Service: Orthopedics;  Laterality: Right;   REVERSE TOTAL SHOULDER ARTHROPLASTY Right 08/02/2015   TONSILLECTOMY     TOTAL KNEE ARTHROPLASTY Right 01/21/2015   Procedure: TOTAL RIGHT KNEE ARTHROPLASTY;  Surgeon: [name], MD;  Location: [facility];  Service: Orthopedics;  Laterality: Right;   TOTAL KNEE ARTHROPLASTY Left 03/23/2016   Procedure: LEFT TOTAL KNEE ARTHROPLASTY;  Surgeon: [name], MD;  Location: [facility];  Service: Orthopedics;  Laterality: Left;   Family History  Problem Relation Age of Onset   Heart attack Mother        Deceased age 71   Colon cancer Father 62       deceased age 53 from colon ca.   Hypertension Brother    Colon polyps Daughter    Uterine cancer Daughter 48   Brain cancer Maternal Uncle    Stroke Maternal Grandmother    Other Paternal Uncle 61       possible cancer   Melanoma Daughter 63   Melanoma Niece 23       d. 61   Esophageal cancer Neg Hx    Stomach cancer Neg Hx    Rectal cancer Neg Hx    Social History   Socioeconomic History   Marital status: Married    Spouse name: Not on file   Number of children: 5   Years of education: Not on file   Highest education level: Not on file  Occupational History   Occupation: Retired  Tobacco Use   Smoking status: Former    Packs/day: 0.25    Years: 40.00    Pack years: 10.00    Types: Cigarettes    Quit date: 02/24/1967    Years since quitting: 54.1    Smokeless tobacco: Never  Vaping Use   Vaping Use: Never used  Substance and Sexual Activity   Alcohol use: Yes    Alcohol/week: 1.0 standard drink    Types: 1 Standard drinks or equivalent per week    Comment: occasionally   Drug use: No   Sexual activity: Not Currently    Partners: Male    Comment: husband vasectomy  Other Topics Concern   Not on file  Social  History Narrative   Not on file   Social Determinants of Health   Financial Resource Strain: Low Risk    Difficulty of Paying Living Expenses: Not hard at all  Food Insecurity: No Food Insecurity   Worried About Charity fundraiser in the Last Year: Never true   [place] in the Last Year: Never true  Transportation Needs: No Transportation Needs   Lack of Transportation (Medical): No   Lack of Transportation (Non-Medical): No  Physical Activity: Sufficiently Active   Days of Exercise per Week: 5 days   Minutes of Exercise per Session: 30 min  Stress: No Stress Concern Present   Feeling of Stress : Not at all  Social Connections: Socially Integrated   Frequency of Communication with Friends and Family: More than three times a week   Frequency of Social Gatherings with Friends and Family: Once a week   Attends Religious Services: 1 to 4 times per year   Active Member of [employer] or Organizations: Yes   Attends Archivist Meetings: 1 to 4 times per year   Marital Status: Married    Tobacco Counseling Counseling given: Not Answered   Clinical Intake:  Pre-visit preparation completed: Yes  Pain : No/denies pain Pain Score: 0-No pain     BMI - recorded: 22.73 Nutritional Status: BMI of 19-24  Normal Nutritional Risks: None Diabetes: No  How often do you need to have someone help you when you read instructions, pamphlets, or other written materials from your doctor or pharmacy?: 1 - Never What is the last grade level you completed in school?: Bachelor's Degree  Diabetic? no  Interpreter  Needed?: No  Information entered by :: Lisette Abu, LPN   Activities of Daily Living In your present state of health, do you have any difficulty performing the following activities: 03/26/2021  Hearing? N  Vision? N  Difficulty concentrating or making decisions? N  Walking or climbing stairs? N  Dressing or bathing? N  Doing errands, shopping? N  Preparing Food and eating ? N  Using the Toilet? N  In the past six months, have you accidently leaked urine? N  Do you have problems with loss of bowel control? N  Managing your Medications? N  Managing your Finances? N  Housekeeping or managing your Housekeeping? N  Some recent data might be hidden    Patient Care Team: [name], MD as PCP - General (Internal Medicine) Josue Hector, MD as PCP - Cardiology (Cardiology) Warden Fillers, MD as Consulting Physician (Ophthalmology) Marygrace Drought, MD as Consulting Physician (Ophthalmology)  Indicate any recent Medical Services you may have received from other than [facility] providers in the past year (date may be approximate).     Assessment:   This is a routine wellness examination for [place].  Hearing/Vision screen Hearing Screening - Comments:: Patient denied any hearing difficulty.   No hearing aids.  Vision Screening - Comments:: Patient wears corrective glasses/contacts.  Eye exam done annually by: Dr. Marygrace Drought  Dietary issues and exercise activities discussed: Current Exercise Habits: Home exercise routine, Type of exercise: walking;stretching;strength training/weights, Time (Minutes): 30, Frequency (Times/Week): 5, Weekly Exercise (Minutes/Week): 150, Intensity: Mild, Exercise limited by: respiratory conditions(s);orthopedic condition(s);cardiac condition(s)   Goals Addressed               This Visit's Progress     Patient Stated (pt-stated)        My goal is to stay healthy and not to fall.  Depression Screen PHQ 2/9 Scores 03/26/2021  03/26/2021 03/21/2020 03/14/2020 03/14/2020 02/20/2019 02/14/2018  PHQ - 2 Score 0 0 0 0 0 0 0    Fall Risk Fall Risk  03/26/2021 03/26/2021 03/21/2020 03/14/2020 03/14/2020  Falls in the past year? 0 1 0 0 0  Number falls in past yr: 0 0 0 - 0  Injury with Fall? 0 1 0 - 0  Risk for fall due to : - - No Fall Risks - -    FALL RISK PREVENTION PERTAINING TO THE HOME:  Any stairs in or around the home? Yes  If so, are there any without handrails? No  Home free of loose throw rugs in walkways, pet beds, electrical cords, etc? Yes  Adequate lighting in your home to reduce risk of falls? Yes   ASSISTIVE DEVICES UTILIZED TO PREVENT FALLS:  Life alert? No  Use of a cane, walker or w/c? No  Grab bars in the bathroom? Yes  Shower chair or bench in shower? No  Elevated toilet seat or a handicapped toilet? Yes   TIMED UP AND GO:  Was the test performed? Yes .  Length of time to ambulate 10 feet: 8 sec.   Gait steady and fast without use of assistive device  Cognitive Function: Normal cognitive status assessed by direct observation by this Nurse Health Advisor. No abnormalities found.       6CIT Screen 03/26/2021  What Year? 0 points  What month? 0 points  What time? 0 points  Count back from 20 0 points  Months in reverse 0 points  Repeat phrase 0 points  Total Score 0    Immunizations Immunization History  Administered Date(s) Administered   Fluad Quad(high Dose 65+) 11/08/2019, 11/14/2020   Influenza Split 12/03/2011   Influenza Whole 11/30/2007, 11/30/2008, 12/03/2009, 11/24/2010   Influenza, High Dose Seasonal PF 11/19/2015, 12/02/2016, 12/17/2017, 11/22/2018   Influenza,inj,Quad PF,6+ Mos 11/10/2012, 11/08/2013, 11/06/2014   Influenza-Unspecified 12/02/2016, 11/22/2018   PFIZER Comirnaty(Gray Top)Covid-19 Tri-Sucrose Vaccine 05/27/2020   PFIZER(Purple Top)SARS-COV-2 Vaccination 03/14/2019, 04/04/2019, 11/18/2019   Pneumococcal Conjugate-13 05/09/2014   Pneumococcal  Polysaccharide-23 11/07/2007   Tdap 05/09/2014   Zoster Recombinat (Shingrix) 04/10/2018, 01/08/2019   Zoster, Live 12/11/2009    TDAP status: Up to date  Flu Vaccine status: Up to date  Pneumococcal vaccine status: Up to date  Covid-19 vaccine status: Completed vaccines  Qualifies for Shingles Vaccine? Yes   Zostavax completed Yes   Shingrix Completed?: Yes  Screening Tests Health Maintenance  Topic Date Due   COVID-19 Vaccine (5 - Booster for Pfizer series) 04/11/2021 (Originally 07/22/2020)   COLONOSCOPY (Pts 45-55yrs Insurance coverage will need to be confirmed)  03/26/2022 (Originally 08/08/2019)   TETANUS/TDAP  05/08/2024   Pneumonia Vaccine 28+ Years old  Completed   INFLUENZA VACCINE  Completed   DEXA SCAN  Completed   Zoster Vaccines- Shingrix  Completed   HPV VACCINES  Aged Out    Health Maintenance  There are no preventive care reminders to display for this patient.   Colorectal cancer screening: No longer required.   Mammogram status: Completed 10/16/2020. Repeat every year  Bone Density status: Completed 10/30/2020. Results reflect: Bone density results: OSTEOPENIA. Repeat every 2 years.  Lung Cancer Screening: (Low Dose CT Chest recommended if Age 6-80 years, 30 pack-year currently smoking OR have quit w/in 15years.) does not qualify.   Lung Cancer Screening Referral: no  Additional Screening:  Hepatitis C Screening: does not qualify; Completed no  Vision Screening: Recommended annual  ophthalmology exams for early detection of glaucoma and other disorders of the eye. Is the patient up to date with their annual eye exam?  Yes  Who is the provider or what is the name of the office in which the patient attends annual eye exams? Marygrace Drought, MD. If pt is not established with a provider, would they like to be referred to a provider to establish care? No .   Dental Screening: Recommended annual dental exams for proper oral hygiene  Community Resource  Referral / Chronic Care Management: CRR required this visit?  No   CCM required this visit?  No      Plan:     I have personally reviewed and noted the following in the patients chart:   Medical and social history Use of alcohol, tobacco or illicit drugs  Current medications and supplements including opioid prescriptions.  Functional ability and status Nutritional status Physical activity Advanced directives List of other physicians Hospitalizations, surgeries, and ER visits in previous 12 months Vitals Screenings to include cognitive, depression, and falls Referrals and appointments  In addition, I have reviewed and discussed with patient certain preventive protocols, quality metrics, and best practice recommendations. A written personalized care plan for preventive services as well as general preventive health recommendations were provided to patient.     Sheral Flow, LPN   05/31/5460   Nurse Notes:  Hearing Screening - Comments:: Patient denied any hearing difficulty.   No hearing aids.  Vision Screening - Comments:: Patient wears corrective glasses/contacts.  Eye exam done annually by: Dr. Marygrace Drought

## 2021-03-28 ENCOUNTER — Other Ambulatory Visit: Payer: Self-pay

## 2021-03-28 ENCOUNTER — Ambulatory Visit (INDEPENDENT_AMBULATORY_CARE_PROVIDER_SITE_OTHER): Payer: Medicare PPO | Admitting: Nurse Practitioner

## 2021-03-28 VITALS — BP 124/80

## 2021-03-28 DIAGNOSIS — R350 Frequency of micturition: Secondary | ICD-10-CM | POA: Diagnosis not present

## 2021-03-28 DIAGNOSIS — N3000 Acute cystitis without hematuria: Secondary | ICD-10-CM

## 2021-03-28 NOTE — Progress Notes (Signed)
° °  Acute Office Visit  Subjective:    Patient ID: Miranda Thompson, female    DOB: [birth year], 88 y.o.   MRN: [id number]   HPI 88 y.o. presents today for urinary frequency, back pain, and fatigue. Saw PCP 03/26/2021 for annual visit and was treated for UTI (collected 03/24/2020) but she did not start the antibiotic as she was unsure if she should. No culture obtained at that time.   Urinalysis 03/24/2021 - large leukocyte, nitrite positive, wbc TNTC, many bacteria, cloudy  Review of Systems  Constitutional:  Positive for fatigue. Negative for chills and fever.  Genitourinary:  Positive for flank pain and frequency. Negative for difficulty urinating, dysuria, hematuria and urgency.      Objective:    Physical Exam Constitutional:      Appearance: Normal appearance.  Abdominal:     Tenderness: There is no abdominal tenderness. There is no right CVA tenderness or left CVA tenderness.  GU: not indicated  BP 124/80    LMP 02/23/1985  Wt Readings from Last 3 Encounters:  03/26/21 132 lb 6.4 oz (60.1 kg)  03/26/21 131 lb 9.6 oz (59.7 kg)  02/19/21 132 lb (59.9 kg)   UA - Leukocytes 1+, negative nitrites, trace blood, yellow/cloudy. Microscopic: wbc 20-40, rbc 0-2, many bacteria     Assessment & Plan:   Problem List Items Addressed This Visit       Genitourinary   Acute cystitis - Primary   Other Visit Diagnoses     Urinary frequency       Relevant Orders   Urinalysis w microscopic + reflex cultur      Plan: Recommend starting Cipro now, increase water intake. She took first dose while in office today. Culture pending.      Tamela Gammon DNP, 12:39 PM 03/28/2021

## 2021-03-30 ENCOUNTER — Encounter: Payer: Self-pay | Admitting: Internal Medicine

## 2021-03-30 NOTE — Assessment & Plan Note (Signed)
BP Readings from Last 3 Encounters:  03/28/21 124/80  03/26/21 118/64  03/26/21 118/60   Stable, pt to continue medical treatment cardizem, losartan

## 2021-03-30 NOTE — Assessment & Plan Note (Signed)
Mild to mod, for urine culture, and cipro course asd,  to f/u any worsening symptoms or concerns

## 2021-03-30 NOTE — Assessment & Plan Note (Signed)
Age and sex appropriate education and counseling updated with regular exercise and diet Referrals for preventative services - declines colonosocopy Immunizations addressed - declines covid booster Smoking counseling  - none needed Evidence for depression or other mood disorder - none significant Most recent labs reviewed. I have personally reviewed and have noted: 1) the patient's medical and social history 2) The patient's current medications and supplements 3) The patient's height, weight, and BMI have been recorded in the chart

## 2021-03-30 NOTE — Assessment & Plan Note (Signed)
Lab Results  Component Value Date   HGBA1C 5.9 03/24/2021   Stable, pt to continue current medical treatment  - diet

## 2021-03-30 NOTE — Assessment & Plan Note (Signed)
Lab Results  Component Value Date   LDLCALC 87 03/24/2021  pt to continue low chol diet, excericse, declines statin

## 2021-03-30 NOTE — Assessment & Plan Note (Signed)
Lab Results  Component Value Date   LDLCALC 87 03/24/2021   Mild uncontrolled, goal ldl < 70,, pt to continue current low chol diet, declines statin

## 2021-03-31 LAB — URINALYSIS W MICROSCOPIC + REFLEX CULTURE
Bilirubin Urine: NEGATIVE
Casts: NONE SEEN /LPF
Crystals: NONE SEEN /HPF
Glucose, UA: NEGATIVE
Hyaline Cast: NONE SEEN /LPF
Ketones, ur: NEGATIVE
Nitrites, Initial: NEGATIVE
Protein, ur: NEGATIVE
Specific Gravity, Urine: 1.004 (ref 1.001–1.035)
Yeast: NONE SEEN /HPF
pH: 6 (ref 5.0–8.0)

## 2021-03-31 LAB — URINE CULTURE
MICRO NUMBER:: 12960831
SPECIMEN QUALITY:: ADEQUATE

## 2021-03-31 LAB — CULTURE INDICATED

## 2021-04-07 ENCOUNTER — Encounter: Payer: Self-pay | Admitting: Nurse Practitioner

## 2021-04-10 ENCOUNTER — Other Ambulatory Visit: Payer: Self-pay

## 2021-04-10 ENCOUNTER — Telehealth: Payer: Self-pay | Admitting: Cardiovascular Disease

## 2021-04-10 ENCOUNTER — Ambulatory Visit (INDEPENDENT_AMBULATORY_CARE_PROVIDER_SITE_OTHER): Payer: Medicare PPO | Admitting: Internal Medicine

## 2021-04-10 ENCOUNTER — Encounter: Payer: Self-pay | Admitting: Internal Medicine

## 2021-04-10 VITALS — BP 132/64 | HR 91 | Ht 63.0 in | Wt 128.0 lb

## 2021-04-10 DIAGNOSIS — I10 Essential (primary) hypertension: Secondary | ICD-10-CM | POA: Diagnosis not present

## 2021-04-10 DIAGNOSIS — R079 Chest pain, unspecified: Secondary | ICD-10-CM

## 2021-04-10 DIAGNOSIS — Z79899 Other long term (current) drug therapy: Secondary | ICD-10-CM | POA: Diagnosis not present

## 2021-04-10 DIAGNOSIS — I700 Atherosclerosis of aorta: Secondary | ICD-10-CM | POA: Diagnosis not present

## 2021-04-10 MED ORDER — PANTOPRAZOLE SODIUM 40 MG PO TBEC: 40.0000 mg | DELAYED_RELEASE_TABLET | Freq: Every day | ORAL | 3 refills | Status: DC

## 2021-04-10 NOTE — Telephone Encounter (Signed)
Patient call sent straight to triage. Patient complaining about chest pain that woke her up early this morning that was sharp and felt like pressure on her chest. Patient stated it does not feel as bad now. Patient stated that she also had nausea and diarrhea with the episode of chest pain. Patient stated she does not have either of these symptoms now. Made patient an appointment with Dr. Harrington Challenger, [place], today. Encouraged patient to go to the ED if symptoms get worse.

## 2021-04-10 NOTE — Telephone Encounter (Signed)
Would prescribe NTG 0.4 mg  Along with protonix    Take prn

## 2021-04-10 NOTE — Progress Notes (Signed)
Cardiology Office Note   Date:  04/10/2021   ID:  Nimah, Uphoff 25-Mar-1933, MRN [id number]  PCP:  [name], MD  Cardiologist:   [name], MD   No chief complaint on file.  Patient presents for evaluation of chst pressure   HPI   The pt is an 88 yo who follows with [name] in clinic  Hx of bronchiectasis, PACs, PVCs   Echo in 2020 LVEF 60 to 65%     She was last in cardiology clinic in Aug 2022  The pt called in today     Says she woke up about 2AM with chest tightnes  that went across chest   Ratdiated to back    Deneis SOB    No PND    Some nausea.   Some diarrha. Note that the patient had just finished 10 days of Cipro for UTI  The pt says her symptoms have improved  though still present  Went from 8/10 at worst to now 2-4/10.       Symptoms are different from her COPD flares.   Has had costochondritis in past   Different   Past Medical History:  Diagnosis Date   Adenomatous colon polyp    Allergic rhinitis    Anemia    pt states no anemia in her past hx thst she is aware of    Atrial fibrillation ([place])    goes in and out of this rhythm- takes Tenormin   Breast cancer, left breast ([place]) 1994 with lumpectomy   chemo,tamox,radiation   Bronchiectasis with acute exacerbation (HCC)    COPD (chronic obstructive pulmonary disease) (HCC)    Diverticulosis of colon    DJD (degenerative joint disease)    Dysrhythmia    palpitations   Family history of brain cancer    Family history of colon cancer 02/14/2018   Family history of colon cancer    Family history of malignant neoplasm of gastrointestinal tract    Family history of melanoma    Family history of uterine cancer    Fibromyalgia    GERD (gastroesophageal reflux disease) 02/14/2018   Glaucoma    Hemoptysis    History of pneumonia    Hypercholesteremia    pt denies   Hypertension    Low back pain syndrome    Neuromuscular disorder (HCC)    hx fibromyalgia    Osteopenia    Pneumonia    hx    Pseudomonas infection    Rotator cuff syndrome of right shoulder    Spinal stenosis     Past Surgical History:  Procedure Laterality Date   CATARACT EXTRACTION Right    COLONOSCOPY     left breast lumpectomy and LN dissection  06/1992   Dr. [name]   LUNG BIOPSY  1968   TSurg---Dr. [name]   LUNG BIOPSY  2012   dr [name]   POLYPECTOMY     REVERSE SHOULDER ARTHROPLASTY Right 08/02/2015   Procedure: RIGHT REVERSE TOTAL SHOULDER ARTHROPLASTY;  Surgeon: [name], MD;  Location: [place];  Service: Orthopedics;  Laterality: Right;   REVERSE TOTAL SHOULDER ARTHROPLASTY Right 08/02/2015   TONSILLECTOMY     TOTAL KNEE ARTHROPLASTY Right 01/21/2015   Procedure: TOTAL RIGHT KNEE ARTHROPLASTY;  Surgeon: [name], MD;  [facility];  Service: Orthopedics;  Laterality: Right;   TOTAL KNEE ARTHROPLASTY Left 03/23/2016   Procedure: LEFT TOTAL KNEE ARTHROPLASTY;  Surgeon: [name], MD;  Location: [facility];  Service: Orthopedics;  Laterality: Left;     Current Outpatient Medications  Medication Sig Dispense Refill   albuterol (VENTOLIN HFA) 108 (90 Base) MCG/ACT inhaler Inhale 2 puffs into the lungs every 6 (six) hours as needed for wheezing or shortness of breath. 8 g 6   amoxicillin (AMOXIL) 500 MG capsule Take prior to dental procedures     ASPIRIN 81 PO Take 1 tablet by mouth daily.     Calcium Carb-Cholecalciferol 600-800 MG-UNIT TABS Take 1 tablet by mouth daily.     diltiazem (CARDIZEM CD) 120 MG 24 hr capsule Take 1 capsule (120 mg total) by mouth daily. 90 capsule 3   guaiFENesin (MUCINEX) 600 MG 12 hr tablet Take by mouth 2 (two) times daily.     losartan (COZAAR) 25 MG tablet Take 0.5 tablets (12.5 mg total) by mouth daily. 45 tablet 3   LUMIGAN 0.01 % SOLN      Multiple Vitamins-Minerals (MULTIVITAMIN & MINERAL PO) Take 1 tablet by mouth daily.     Nutritional Supplements (VITAMIN D BOOSTER PO) Take by mouth.     Respiratory Therapy Supplies (FLUTTER) DEVI 1 application by  Does not apply route 3 (three) times daily. 1 each 0   Saccharomyces boulardii (FLORASTOR PO) Take 1 capsule by mouth daily.     Tiotropium Bromide-Olodaterol (STIOLTO RESPIMAT) 2.5-2.5 MCG/ACT AERS Inhale 2 puffs into the lungs daily. 4 g 7   Zinc 30 MG CAPS      No current facility-administered medications for this visit.    Allergies:   Patient has no known allergies.    Social History:  The patient  reports that she quit smoking about 54 years ago. Her smoking use included cigarettes. She has a 10.00 pack-year smoking history. She has never used smokeless tobacco. She reports current alcohol use of about 1.0 standard drink per week. She reports that she does not use drugs.   Family History:  The patient's family history includes Brain cancer in her maternal uncle; Colon cancer (age of onset: 40) in her father; Colon polyps in her daughter; Heart attack in her mother; Hypertension in her brother; Melanoma (age of onset: 71) in her niece; Melanoma (age of onset: 65) in her daughter; Other (age of onset: 97) in her paternal uncle; Stroke in her maternal grandmother; Uterine cancer (age of onset: 1) in her daughter.    ROS:  Please see the history of present illness.  Otherwise, review of systems are positive for none. All other systems are reviewed and negative.    PHYSICAL EXAM: VS:  BP 132/64    Pulse 91    Ht 5' 6.75" (1.695 m)    Wt 128 lb (58.1 kg)    LMP 02/23/1985    BMI 20.20 kg/m  , BMI Body mass index is 20.2 kg/m. Elderly female in NAD  HEENT: normal Neck supple with no adenopathy JVP normal no bruits no thyromegaly Lungs clear with very mild wheeze Heart:  S1/S2 no murmur, no rub, gallop PMI normal Abdomen: benighn, BS positve, no tenderness no bruit.  No HSM or HJR Distal pulses intact with no bruits No LE edema Neuro non-focal Skin warm and dry No muscular weakness     EKG:  EKG shows NSR 91 bpm .   Recent Labs: 12/02/2020: Magnesium 2.0 03/24/2021: ALT 15;  BUN 15; Creatinine, Ser 0.65; Hemoglobin 12.6; Platelets 237.0; Potassium 4.3; Sodium 135; TSH 2.92    Lipid Panel    Component Value Date/Time   CHOL  158 03/24/2021 1116   TRIG 51.0 03/24/2021 1116   HDL 60.70 03/24/2021 1116   CHOLHDL 3 03/24/2021 1116   VLDL 10.2 03/24/2021 1116   LDLCALC 87 03/24/2021 1116     Wt Readings from Last 3 Encounters:  04/10/21 128 lb (58.1 kg)  03/26/21 132 lb 6.4 oz (60.1 kg)  03/26/21 131 lb 9.6 oz (59.7 kg)    Other studies Reviewed: Additional studies/ records that were reviewed today include: . Review of the above records demonstrates:   Lower extremity doppler studies 01/10/20:   Right: Resting right ankle-brachial index is within normal range. No  evidence of significant right lower extremity arterial disease. The right  toe-brachial index is normal.   Left: Resting left ankle-brachial index is within normal range. No  evidence of significant left lower extremity arterial disease. The left  toe-brachial index is normal.  ASSESSMENT AND PLAN:  1  Chest pain  Pt describes pressure across chest and back   Not worse with activity   No SOB   I am not convinced angina  She does have some mild wheeze on exam    ? Pulmonary She also just finished 10 days of Cipro   Had some diarreha   ? UGI  WIll get Troponin     Keep on current meds  Can give NTG as needed   Wiould empirically start protonix for short term   2  Rhythm    Hx of ? MAT   Does have PACs, PVCs   Keep on current meds    2.  Aortic atherosclerosis: -ASA no clinical CAD    3.  HTN:  BP is a fair.  Follow  -   4.  Bronchiectasis: -Followed by Dr. Valeta Harms with pulmonary medicine     Current medicines are reviewed at length with the patient today.  The patient does not have concerns regarding medicines.  Labs/ tests ordered today include: None No orders of the defined types were placed in this encounter.   Disposition:   FU in a year   Signed, Dorris Carnes, MD   04/10/2021 3:03 [facility] [address], [place], [place]  [address] Phone: [phone]; Fax: [phone]

## 2021-04-10 NOTE — Telephone Encounter (Signed)
Pt c/o of Chest Pain: STAT if CP now or developed within 24 hours  1. Are you having CP right now? no  2. Are you experiencing any other symptoms (ex. SOB, nausea, vomiting, sweating)? Nausea, diarrhea  3. How long have you been experiencing CP? Last night  4. Is your CP continuous or coming and going? continuous  5. Have you taken Nitroglycerin? no ?

## 2021-04-10 NOTE — Patient Instructions (Addendum)
Medication Instructions:  Protonix 40 mg daily  *If you need a refill on your cardiac medications before your next appointment, please call your pharmacy*   Lab Work: CBC, BMET, TROPONIN T If you have labs (blood work) drawn today and your tests are completely normal, you will receive your results only by: [facility] (if you have MyChart) OR A paper copy in the mail If you have any lab test that is abnormal or we need to change your treatment, we will call you to review the results.   Testing/Procedures: NONE   Follow-Up: At [facility], you and your health needs are our priority.  As part of our continuing mission to provide you with exceptional heart care, we have created designated Provider Care Teams.  These Care Teams include your primary Cardiologist (physician) and Advanced Practice Providers (APPs -  Physician Assistants and Nurse Practitioners) who all work together to provide you with the care you need, when you need it.  We recommend signing up for the patient portal called "MyChart".  Sign up information is provided on this After Visit Summary.  MyChart is used to connect with patients for Virtual Visits (Telemedicine).  Patients are able to view lab/test results, encounter notes, upcoming appointments, etc.  Non-urgent messages can be sent to your provider as well.   To learn more about what you can do with MyChart, go to [email or web address].

## 2021-04-11 LAB — BASIC METABOLIC PANEL
BUN/Creatinine Ratio: 22 (ref 12–28)
BUN: 12 mg/dL (ref 8–27)
CO2: 21 mmol/L (ref 20–29)
Calcium: 9.2 mg/dL (ref 8.7–10.3)
Chloride: 97 mmol/L (ref 96–106)
Creatinine, Ser: 0.55 mg/dL — ABNORMAL LOW (ref 0.57–1.00)
Glucose: 97 mg/dL (ref 70–99)
Potassium: 4.7 mmol/L (ref 3.5–5.2)
Sodium: 137 mmol/L (ref 134–144)
eGFR: 90 mL/min/{1.73_m2} (ref >59)

## 2021-04-11 LAB — CBC
Hematocrit: 35.8 % (ref 34.0–46.6)
Hemoglobin: 12.3 g/dL (ref 11.1–15.9)
MCH: 32.9 pg (ref 26.6–33.0)
MCHC: 34.4 g/dL (ref 31.5–35.7)
MCV: 96 fL (ref 79–97)
Platelets: 252 10*3/uL (ref 150–450)
RBC: 3.74 x10E6/uL — ABNORMAL LOW (ref 3.77–5.28)
RDW: 11.4 % — ABNORMAL LOW (ref 11.7–15.4)
WBC: 6.8 10*3/uL (ref 3.4–10.8)

## 2021-04-11 LAB — TROPONIN T: Troponin T (Highly Sensitive): 15 ng/L (ref 0–14)

## 2021-04-14 ENCOUNTER — Encounter: Payer: Self-pay | Admitting: Internal Medicine

## 2021-04-16 MED ORDER — NITROGLYCERIN 0.4 MG SL SUBL: 0.4000 mg | SUBLINGUAL_TABLET | SUBLINGUAL | 3 refills | Status: DC | PRN

## 2021-04-16 NOTE — Addendum Note (Signed)
Addended by: Stephani Police on: 04/16/2021 08:35 AM   Modules accepted: Orders

## 2021-04-16 NOTE — Telephone Encounter (Signed)
Pt advised and verbalized understanding of Nitro use and is already using the Protonix as needed and has had some improvement.

## 2021-04-17 ENCOUNTER — Encounter: Payer: Self-pay | Admitting: Internal Medicine

## 2021-04-18 NOTE — Addendum Note (Signed)
Addended by: Drue Novel I on: 04/18/2021 08:07 AM   Modules accepted: Orders

## 2021-04-24 ENCOUNTER — Encounter: Payer: Self-pay | Admitting: Pulmonary Disease

## 2021-04-24 NOTE — Telephone Encounter (Signed)
Called the pt and there was no answer- LMTCB to get more info regarding her symptoms.  ?

## 2021-04-25 ENCOUNTER — Telehealth: Payer: Self-pay | Admitting: Pulmonary Disease

## 2021-04-25 NOTE — Telephone Encounter (Signed)
Please see 04/24/2021 my chart message.  ? ?

## 2021-04-25 NOTE — Telephone Encounter (Signed)
Hi Dr. Valeta Harms, ?I just wanted to let you know that I had a strange episode February 16 b/w 1 and 3 A M, I awoke with very sharp pains in the chest and back and since I have had 3-4 episodes of Costochondritis in the past, I thought if it was a heart attack I would know it because it would be different, really feels close though.  When I told my husband in the morning, and after he cooled down, I called  [name] was out, Dr. Harrington Challenger saw me, I informed her that I had completed a 10 day Cipro treatment a few days before for a UTI.  Dr Jenny Reichmann had lab work done and the urinalysis showed lots of bacteria and white cells, I did not have the usual symptoms, except for frequent urination, drinking 64 ounces of water a day explains that, no nighttime urgencies.   The Tropoline (sp?) was 15, just one gradation above normal range, Dr. Harrington Challenger thought that might be the result of the Cipro.  I have taken Cipro in the past for lung inflammation w/o any after affects. ?  ?Dr. Harrington Challenger thought it might possibly be a gastrointestinal issue cause by the Cipro and RX'd a reflux med for temporary use, after a few days, she called with test results and said to use med only when needed after that time. ?  ? The chest discomfort I experience daily is more prevalent, would  follow up on your part  be advisable, to check if there are changes in the lungs/chest area.  Dr. Harrington Challenger told me to get to the ER if that happens again.  She prescribed Nirtoglycerin in case of emergency. ?Thanks, ?Miranda Thompson ?1933/08/31 ? ? ?Spoke to patient via telephone and verified above statement. Patient stated that chest discomfort has been present for 20+ years. Sx were slightly worse on 04/10/2021 but have since returned to baseline.  She described pain as a persistent pain that is always there.  ?C/o prod cough with green sputum and sob with exertion x1y.  ?She followed up with Cardiology on 04/10/21 and was prescribed nitroglycerin PRN.  ?I offered to send message to  [facility], as Dr. Valeta Harms is unavailable until 04/28/2021. She stated that she would like to wait for a response from Dr. Valeta Harms. ?Advised patient to go to ED if sx worsen in the meantime. She voiced her understanding.  ? ?Dr. Valeta Harms, please advise. Thanks ? ? ?

## 2021-05-28 ENCOUNTER — Other Ambulatory Visit: Payer: Self-pay | Admitting: Cardiology

## 2021-07-15 ENCOUNTER — Ambulatory Visit (INDEPENDENT_AMBULATORY_CARE_PROVIDER_SITE_OTHER): Payer: Medicare PPO | Admitting: Obstetrics and Gynecology

## 2021-07-15 VITALS — BP 122/68 | HR 67 | Wt 130.0 lb

## 2021-07-15 DIAGNOSIS — R1030 Lower abdominal pain, unspecified: Secondary | ICD-10-CM | POA: Diagnosis not present

## 2021-07-15 DIAGNOSIS — R350 Frequency of micturition: Secondary | ICD-10-CM

## 2021-07-15 LAB — URINALYSIS, COMPLETE
Bilirubin Urine: NEGATIVE
Casts: NONE SEEN /LPF
Crystals: NONE SEEN /HPF
Glucose, UA: NEGATIVE
Hyaline Cast: NONE SEEN /LPF
Ketones, ur: NEGATIVE
Nitrite: NEGATIVE
Protein, ur: NEGATIVE
Specific Gravity, Urine: 1.01 (ref 1.001–1.035)
Yeast: NONE SEEN /HPF
pH: 6.5 (ref 5.0–8.0)

## 2021-07-15 MED ORDER — PHENAZOPYRIDINE HCL 200 MG PO TABS: 200.0000 mg | ORAL_TABLET | Freq: Three times a day (TID) | ORAL | 0 refills | Status: DC

## 2021-07-15 MED ORDER — SULFAMETHOXAZOLE-TRIMETHOPRIM 800-160 MG PO TABS
1.0000 | ORAL_TABLET | Freq: Two times a day (BID) | ORAL | 0 refills | Status: DC
Start: 2021-07-15 — End: 2021-10-01

## 2021-07-15 NOTE — Progress Notes (Signed)
GYNECOLOGY  VISIT   HPI: 88 y.o.   Married White or Caucasian Not Hispanic or Latino  female   [phone] with Patient's last menstrual period was 02/23/1985.   here for possible urinary infection. She is having lower back pain and lower abdominal pain. She has also had more frequency of urination.   She has a 2 day h/o lower abdominal pressure, has spread around to her back. The pain is constant, up to an 8.5/10 in severity. She is having some increase in her frequency of urination, normal amounts. She has baseline urgency to void. No dysuria. Feels tired, no fevers. BM are normal.   No vaginal bleeding, no discharge.   GYNECOLOGIC HISTORY: Patient's last menstrual period was 02/23/1985. 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   Hyponatremia 03/20/2020   Hyperglycemia 02/20/2019   Genetic testing 11/11/2018   Family history of uterine cancer    Family history of brain cancer    Family history of melanoma    GERD (gastroesophageal reflux disease) 02/14/2018   Glaucoma 02/14/2018   Family history of colon cancer 02/14/2018   Encounter for well adult exam with abnormal findings 02/14/2018   Osteoporosis 02/14/2018   History of left breast cancer 01/13/2018   S/P knee replacement 03/23/2016   S/P shoulder replacement 08/02/2015   Palpitations 08/20/2014   Episode of hypertension 08/20/2014   COPD, moderate ([place]) 05/09/2014   Premature atrial contractions 11/08/2013   Cardiac arrhythmia 05/10/2013   Trochanteric bursitis of left hip 02/02/2012   Osteoarthritis of left hip 06/25/2011   Leg length inequality 06/25/2011    Left ovarian cyst 01/20/2011   Elevated lipids 11/04/2009   Acute cystitis 11/04/2009   LOW BACK PAIN SYNDROME 05/07/2008   Bronchiectasis with acute exacerbation ([place]) 03/18/2007   Hemoptysis 03/18/2007   PNEUMONIA 03/07/2007   Disorder of bone and cartilage 03/07/2007   BRONCHIECTASIS 03/04/2007   Osteoarthritis 03/04/2007    Past Medical History:  Diagnosis Date   Adenomatous colon polyp    Allergic rhinitis    Anemia    pt states no anemia in her past hx thst she is aware of    Atrial fibrillation ([place])    goes in and out of this rhythm- takes Tenormin   Breast cancer, left breast ([place]) 1994 with lumpectomy   chemo,tamox,radiation   Bronchiectasis with acute exacerbation ([place])    COPD (chronic obstructive pulmonary disease) ([place])    Diverticulosis of colon    DJD (degenerative joint disease)    Dysrhythmia    palpitations   Family history of brain cancer    Family history of colon cancer 02/14/2018   Family history of colon cancer    Family history of malignant neoplasm of gastrointestinal tract    Family history of melanoma    Family history of uterine cancer  Fibromyalgia    GERD (gastroesophageal reflux disease) 02/14/2018   Glaucoma    Hemoptysis    History of pneumonia    Hypercholesteremia    pt denies   Hypertension    Low back pain syndrome    Neuromuscular disorder (HCC)    hx fibromyalgia    Osteopenia    Pneumonia    hx   Pseudomonas infection    Rotator cuff syndrome of right shoulder    Spinal stenosis     Past Surgical History:  Procedure Laterality Date   CATARACT EXTRACTION Right    COLONOSCOPY     left breast lumpectomy and LN dissection  06/1992   Dr. [name]   LUNG BIOPSY  1968   TSurg---Dr. [name]   LUNG BIOPSY  2012   dr [name]   POLYPECTOMY     REVERSE SHOULDER ARTHROPLASTY Right 08/02/2015   Procedure: RIGHT REVERSE TOTAL SHOULDER ARTHROPLASTY;  Surgeon: [name], MD;  Location: [place];  Service: Orthopedics;  Laterality:  Right;   REVERSE TOTAL SHOULDER ARTHROPLASTY Right 08/02/2015   TONSILLECTOMY     TOTAL KNEE ARTHROPLASTY Right 01/21/2015   Procedure: TOTAL RIGHT KNEE ARTHROPLASTY;  Surgeon: [name], MD;  [facility];  Service: Orthopedics;  Laterality: Right;   TOTAL KNEE ARTHROPLASTY Left 03/23/2016   Procedure: LEFT TOTAL KNEE ARTHROPLASTY;  Surgeon: [name], MD;  [facility];  Service: Orthopedics;  Laterality: Left;    Current Outpatient Medications  Medication Sig Dispense Refill   albuterol (VENTOLIN HFA) 108 (90 Base) MCG/ACT inhaler Inhale 2 puffs into the lungs every 6 (six) hours as needed for wheezing or shortness of breath. 8 g 6   amoxicillin (AMOXIL) 500 MG capsule Take prior to dental procedures     ASPIRIN 81 PO Take 1 tablet by mouth daily.     Calcium Carb-Cholecalciferol 600-800 MG-UNIT TABS Take 1 tablet by mouth daily.     diltiazem (CARDIZEM CD) 120 MG 24 hr capsule Take 1 capsule (120 mg total) by mouth daily. 90 capsule 3   guaiFENesin (MUCINEX) 600 MG 12 hr tablet Take by mouth 2 (two) times daily.     losartan (COZAAR) 25 MG tablet Take 0.5 tablets (12.5 mg total) by mouth daily. 45 tablet 3   LUMIGAN 0.01 % SOLN      Multiple Vitamins-Minerals (MULTIVITAMIN & MINERAL PO) Take 1 tablet by mouth daily.     nitroGLYCERIN (NITROSTAT) 0.4 MG SL tablet Place 1 tablet (0.4 mg total) under the tongue every 5 (five) minutes as needed for chest pain. 25 tablet 3   Nutritional Supplements (VITAMIN D BOOSTER PO) Take by mouth.     pantoprazole (PROTONIX) 40 MG tablet Take 1 tablet (40 mg total) by mouth daily. 90 tablet 3   Respiratory Therapy Supplies (FLUTTER) DEVI 1 application by Does not apply route 3 (three) times daily. 1 each 0   Saccharomyces boulardii (FLORASTOR PO) Take 1 capsule by mouth daily.     Tiotropium Bromide-Olodaterol (STIOLTO RESPIMAT) 2.5-2.5 MCG/ACT AERS Inhale 2 puffs into the lungs daily. 4 g 7   Zinc 30 MG CAPS      No current  facility-administered medications for this visit.     ALLERGIES: Patient has no known allergies.  Family History  Problem Relation Age of Onset   Heart attack Mother        Deceased age 78   Colon cancer Father 13       deceased age 65 from colon ca.  Hypertension Brother    Colon polyps Daughter    Uterine cancer Daughter 69   Brain cancer Maternal Uncle    Stroke Maternal Grandmother    Other Paternal Uncle 51       possible cancer   Melanoma Daughter 46   Melanoma Niece 41       d. 75   Esophageal cancer Neg Hx    Stomach cancer Neg Hx    Rectal cancer Neg Hx     Social History   Socioeconomic History   Marital status: Married    Spouse name: Not on file   Number of children: 5   Years of education: Not on file   Highest education level: Not on file  Occupational History   Occupation: Retired  Tobacco Use   Smoking status: Former    Packs/day: 0.25    Years: 40.00    Pack years: 10.00    Types: Cigarettes    Quit date: 02/24/1967    Years since quitting: 54.4   Smokeless tobacco: Never  Vaping Use   Vaping Use: Never used  Substance and Sexual Activity   Alcohol use: Yes    Alcohol/week: 1.0 standard drink    Types: 1 Standard drinks or equivalent per week    Comment: occasionally   Drug use: No   Sexual activity: Not Currently    Partners: Male    Comment: husband vasectomy  Other Topics Concern   Not on file  Social History Narrative   Not on file   Social Determinants of Health   Financial Resource Strain: Low Risk    Difficulty of Paying Living Expenses: Not hard at all  Food Insecurity: No Food Insecurity   Worried About Charity fundraiser in the Last Year: Never true   Centre in the Last Year: Never true  Transportation Needs: No Transportation Needs   Lack of Transportation (Medical): No   Lack of Transportation (Non-Medical): No  Physical Activity: Sufficiently Active   Days of Exercise per Week: 5 days   Minutes of Exercise  per Session: 30 min  Stress: No Stress Concern Present   Feeling of Stress : Not at all  Social Connections: Socially Integrated   Frequency of Communication with Friends and Family: More than three times a week   Frequency of Social Gatherings with Friends and Family: Once a week   Attends Religious Services: 1 to 4 times per year   Active Member of [employer] or Organizations: Yes   Attends Archivist Meetings: 1 to 4 times per year   Marital Status: Married  Human resources officer Violence: Not At Risk   Fear of Current or Ex-Partner: No   Emotionally Abused: No   Physically Abused: No   Sexually Abused: No    Review of Systems  Genitourinary:  Positive for flank pain and frequency.   PHYSICAL EXAMINATION:    BP 122/68   Pulse 67   Wt 130 lb (59 kg)   LMP 02/23/1985   SpO2 98%   BMI 23.03 kg/m     General appearance: alert, cooperative and appears stated age Abdomen: soft, non-tender; moderately distended, no masses,  no organomegaly  Pelvic: External genitalia:  no lesions              Urethra:  normal appearing urethra with no masses, tenderness or lesions              Bartholins and Skenes: normal  Vagina: normal appearing vagina with normal color and discharge, no lesions              Cervix: no lesions              Bimanual Exam:  Uterus:  normal size, contour, position, consistency, mobility, non-tender              Adnexa:  no masses, bilateral adnexal tenderness              Bladder: tender  Chaperone was present for exam.  1. Lower abdominal pain She has some abdominal distention, but not tenderness - Urinalysis, Complete - Urine Culture -Will treat for possible UTI, I told her I'm not sure that is what is going on. If she isn't feeling better with the pyridium, she should f/u with Dr Jenny Reichmann. -Needs to be seen if she develops a fever, worsening pain or any other concerns.   2. Urinary frequency - Urinalysis, Complete - Urine Culture -  sulfamethoxazole-trimethoprim (BACTRIM DS) 800-160 MG tablet; Take 1 tablet by mouth 2 (two) times daily. One PO BID x 3 days  Dispense: 6 tablet; Refill: 0 - phenazopyridine (PYRIDIUM) 200 MG tablet; Take 1 tablet (200 mg total) by mouth 3 (three) times daily with meals.  Dispense: 6 tablet; Refill: 0

## 2021-07-17 ENCOUNTER — Encounter: Payer: Self-pay | Admitting: Obstetrics and Gynecology

## 2021-07-18 LAB — URINE CULTURE
MICRO NUMBER:: 13433745
SPECIMEN QUALITY:: ADEQUATE

## 2021-07-22 ENCOUNTER — Telehealth: Payer: Self-pay

## 2021-07-22 MED ORDER — CIPROFLOXACIN HCL 250 MG PO TABS: 250.0000 mg | ORAL_TABLET | Freq: Two times a day (BID) | ORAL | 0 refills | Status: DC

## 2021-07-22 NOTE — Telephone Encounter (Signed)
I spoke with Dr. Talbert Nan who recommended that patient can try this much lower dose of Cipro. I did inform Dr. Talbert Nan that patient said no rash, no SOB, no other symptoms except the discomfort around her chest/back a week after she had finished the 875 mg Cipro previously.   I did let patient know the other indicated antibiotics were all IV antibiotics.  I told patient if anything bothers her to stop it immediately and report. Rx sent.  Patient wanted to ask if Dr. Talbert Nan felt she should return for follow up u/a after Rx since this infection has been so stubborn - "to be sure it clears up".

## 2021-07-22 NOTE — Telephone Encounter (Signed)
-----   Message from Salvadore Dom, MD sent at 07/22/2021 10:04 AM EDT ----- Please let the patient know that she does have a UTI and that it isn't sensitive to the antibiotic she was given. Please treat her with ciprofloxacin 250 mg po q 12 hours, #6, no refills.  Please check on how she is feeling.

## 2021-07-22 NOTE — Telephone Encounter (Signed)
I spoke with patient. She said that she took the Cipro 875 mg x 10 days with no problems. However a week after she had completed the medication she had a horrible pain in her heart and back like a chain around her. She said she has COPD but this was so much more intense. She said her MD did an EKG that was normal and told her they thought the Cipro caused it.  She said in the past she has taken Cipro 500 mg with no problems at all.  Do you still need me to try to find a different antibiotic.

## 2021-07-22 NOTE — Telephone Encounter (Signed)
Can you ask her details of her side effects to the cipro? If she can't take it we need to enter it as an allergy. I remember her saying she didn't tolerate it well, but I didn't realize the extent of it.   The only none IV medications that the lab gave as sensitive to the pseudomonas are ciprofloxacin and levofloxacin (same category of medication). Can you check with the pathologist is the lab if there are any other oral options?

## 2021-07-22 NOTE — Telephone Encounter (Signed)
It is no longer recommended to do a test of cure after treatment of a UTI.

## 2021-07-22 NOTE — Telephone Encounter (Signed)
I spoke with patient.  She said the "discomfort in my back and abdomen has lessened but still there". Still with frequency.  She wanted me to remind you that in April she took Cipro 875 mg and had terrible reaction with heart attack like symptoms.

## 2021-07-22 NOTE — Telephone Encounter (Signed)
Patient advised.

## 2021-08-05 DIAGNOSIS — M25511 Pain in right shoulder: Secondary | ICD-10-CM | POA: Diagnosis not present

## 2021-08-05 DIAGNOSIS — M25512 Pain in left shoulder: Secondary | ICD-10-CM | POA: Diagnosis not present

## 2021-08-24 ENCOUNTER — Other Ambulatory Visit: Payer: Self-pay | Admitting: Cardiology

## 2021-08-25 MED ORDER — LOSARTAN POTASSIUM 25 MG PO TABS: 12.5000 mg | ORAL_TABLET | Freq: Every day | ORAL | 2 refills | Status: DC

## 2021-09-29 ENCOUNTER — Other Ambulatory Visit: Payer: Self-pay | Admitting: Cardiology

## 2021-09-30 ENCOUNTER — Encounter: Payer: Self-pay | Admitting: Cardiovascular Disease

## 2021-09-30 ENCOUNTER — Telehealth: Payer: Self-pay | Admitting: Cardiovascular Disease

## 2021-09-30 NOTE — Telephone Encounter (Signed)
Called patient back about her message. Patient complaining of lightheadedness, fatigue, chronic chest pain (patient blames COPD) and falling yesterday. Patient is concerned that this has something to do with her BP. Informed patient that her BP is elevated, but not too high and usually these symptoms are results of a low BP. Patient stated she fell on her bottom and bumped her head, not sure if she missed stepped. Patient stated she was having these symptoms before the fall. Patient stated today her neck and head are a little sore but other wise okay. Made patient an appointment with the DOD tomorrow. Encouraged patient to also call her PCP to make sure nothing else is going on, that this might not be heart related. Patient verbalized understanding.

## 2021-09-30 NOTE — Progress Notes (Unsigned)
Cardiology Office Note:    Date:  10/01/2021   ID:  Miranda Thompson, DOB 01-02-1934, MRN [id number]  PCP:  [name], MD   [place] Prov[facility] Cardiologist:  Jenkins Rouge, MD {    Referring MD: [name], MD   Chief Complaint  Patient presents with   Dizziness     History of Present Illness:    Miranda Thompson is a 88 y.o. female with a hx of  COPD. She has had recent lightheadedness and a recent fall Seen with husband, Franchot Mimes   Was walking on Monday, saw a neighbor, was talking  Suddenly felt dizzy, heavy Felt woozy Walked over to mail box to hold on Had a syncopal episode,  fell and hit her head  Did not go to the ER  Had taked her meds but had not had breakfast prior to going to walk   She walks every day ,  normal routine for her   Short of breath with all activity (walking up stairs, waking aroudn the block )  Dyspnea seems to be worse now    Has occasional episodes of lightheadedness with walking  Mild chronic dyspnea from COPD   Last echocardiogram was in August, 2020.  She has normal left ventricular systolic function with ejection fraction of 60 to 65%.  She has grade 2 diastolic dysfunction.  The right ventricle is normal.  There is no aortic stenosis or mitral stenosis. There is trivial mitral regurgitation.  Mild tricuspid regurgitation.  Has chronic CP  Saw Dr. Harrington Challenger in March for a severe episode of cp  ECG showed poor wave progression     Past Medical History:  Diagnosis Date   Adenomatous colon polyp    Allergic rhinitis    Anemia    pt states no anemia in her past hx thst she is aware of    Atrial fibrillation [facility])    goes in and out of this rhythm- takes Tenormin   Breast cancer, left breast ([place]) 1994 with lumpectomy   chemo,tamox,radiation   Bronchiectasis with acute exacerbation (HCC)    COPD (chronic obstructive pulmonary disease) (HCC)    Diverticulosis of colon    DJD (degenerative joint disease)    Dysrhythmia     palpitations   Family history of brain cancer    Family history of colon cancer 02/14/2018   Family history of colon cancer    Family history of malignant neoplasm of gastrointestinal tract    Family history of melanoma    Family history of uterine cancer    Fibromyalgia    GERD (gastroesophageal reflux disease) 02/14/2018   Glaucoma    Hemoptysis    History of pneumonia    Hypercholesteremia    pt denies   Hypertension    Low back pain syndrome    Neuromuscular disorder (HCC)    hx fibromyalgia    Osteopenia    Pneumonia    hx   Pseudomonas infection    Rotator cuff syndrome of right shoulder    Spinal stenosis     Past Surgical History:  Procedure Laterality Date   CATARACT EXTRACTION Right    COLONOSCOPY     left breast lumpectomy and LN dissection  06/1992   Dr. [name]   LUNG BIOPSY  1968   TSurg---Dr. [name]   LUNG BIOPSY  2012   dr nadel   POLYPECTOMY     REVERSE SHOULDER ARTHROPLASTY Right 08/02/2015   Procedure: RIGHT REVERSE TOTAL SHOULDER  ARTHROPLASTY;  Surgeon: [name], MD;  Location: [place];  Service: Orthopedics;  Laterality: Right;   REVERSE TOTAL SHOULDER ARTHROPLASTY Right 08/02/2015   TONSILLECTOMY     TOTAL KNEE ARTHROPLASTY Right 01/21/2015   Procedure: TOTAL RIGHT KNEE ARTHROPLASTY;  Surgeon: [name], MD;  Location: [facility];  Service: Orthopedics;  Laterality: Right;   TOTAL KNEE ARTHROPLASTY Left 03/23/2016   Procedure: LEFT TOTAL KNEE ARTHROPLASTY;  Surgeon: [name], MD;  Location: [facility];  Service: Orthopedics;  Laterality: Left;    Current Medications: Current Meds  Medication Sig   albuterol (VENTOLIN HFA) 108 (90 Base) MCG/ACT inhaler Inhale 2 puffs into the lungs every 6 (six) hours as needed for wheezing or shortness of breath.   amoxicillin (AMOXIL) 500 MG capsule Take prior to dental procedures   ASPIRIN 81 PO Take 1 tablet by mouth daily.   Calcium Carb-Cholecalciferol 600-800 MG-UNIT TABS Take 1 tablet by mouth daily.    diltiazem (CARDIZEM CD) 120 MG 24 hr capsule Take 1 capsule (120 mg total) by mouth daily.   guaiFENesin (MUCINEX) 600 MG 12 hr tablet Take by mouth 2 (two) times daily.   losartan (COZAAR) 25 MG tablet Take 0.5 tablets (12.5 mg total) by mouth daily.   LUMIGAN 0.01 % SOLN    metoprolol tartrate (LOPRESSOR) 100 MG tablet Take 1 tablet (100 mg total) by mouth once for 1 dose. Take 1 tablet (100 mg total) two hours prior to CT scan.   Multiple Vitamins-Minerals (MULTIVITAMIN & MINERAL PO) Take 1 tablet by mouth daily.   nitroGLYCERIN (NITROSTAT) 0.4 MG SL tablet Place 1 tablet (0.4 mg total) under the tongue every 5 (five) minutes as needed for chest pain.   Nutritional Supplements (VITAMIN D BOOSTER PO) Take by mouth.   Respiratory Therapy Supplies (FLUTTER) DEVI 1 application by Does not apply route 3 (three) times daily.   Saccharomyces boulardii (FLORASTOR PO) Take 1 capsule by mouth daily.   Tiotropium Bromide-Olodaterol (STIOLTO RESPIMAT) 2.5-2.5 MCG/ACT AERS Inhale 2 puffs into the lungs daily.   Zinc 30 MG CAPS    [DISCONTINUED] ciprofloxacin (CIPRO) 250 MG tablet Take 1 tablet (250 mg total) by mouth 2 (two) times daily.   [DISCONTINUED] pantoprazole (PROTONIX) 40 MG tablet Take 1 tablet (40 mg total) by mouth daily.   [DISCONTINUED] phenazopyridine (PYRIDIUM) 200 MG tablet Take 1 tablet (200 mg total) by mouth 3 (three) times daily with meals.   [DISCONTINUED] sulfamethoxazole-trimethoprim (BACTRIM DS) 800-160 MG tablet Take 1 tablet by mouth 2 (two) times daily. One PO BID x 3 days     Allergies:   Patient has no known allergies.   Social History   Socioeconomic History   Marital status: Married    Spouse name: Not on file   Number of children: 5   Years of education: Not on file   Highest education level: Not on file  Occupational History   Occupation: Retired  Tobacco Use   Smoking status: Former    Packs/day: 0.25    Years: 40.00    Total pack years: 10.00    Types:  Cigarettes    Quit date: 02/24/1967    Years since quitting: 54.6   Smokeless tobacco: Never  Vaping Use   Vaping Use: Never used  Substance and Sexual Activity   Alcohol use: Yes    Alcohol/week: 1.0 standard drink of alcohol    Types: 1 Standard drinks or equivalent per week    Comment: occasionally   Drug use: No  Sexual activity: Not Currently    Partners: Male    Comment: husband vasectomy  Other Topics Concern   Not on file  Social History Narrative   Not on file   Social Determinants of Health   Financial Resource Strain: Low Risk  (03/26/2021)   Overall Financial Resource Strain (CARDIA)    Difficulty of Paying Living Expenses: Not hard at all  Food Insecurity: No Food Insecurity (03/26/2021)   Hunger Vital Sign    Worried About Running Out of Food in the Last Year: Never true    Pace in the Last Year: Never true  Transportation Needs: No Transportation Needs (03/26/2021)   PRAPARE - Hydrologist (Medical): No    Lack of Transportation (Non-Medical): No  Physical Activity: Sufficiently Active (03/26/2021)   Exercise Vital Sign    Days of Exercise per Week: 5 days    Minutes of Exercise per Session: 30 min  Stress: No Stress Concern Present (03/26/2021)   [place]    Feeling of Stress : Not at all  Social Connections: [place] (03/26/2021)   Social Connection and Isolation Panel [NHANES]    Frequency of Communication with Friends and Family: More than three times a week    Frequency of Social Gatherings with Friends and Family: Once a week    Attends Religious Services: 1 to 4 times per year    Active Member of [employer] or Organizations: Yes    Attends Music therapist: 1 to 4 times per year    Marital Status: Married     Family History: The patient's family history includes Brain cancer in her maternal uncle; Colon cancer (age of onset: 37) in  her father; Colon polyps in her daughter; Heart attack in her mother; Hypertension in her brother; Melanoma (age of onset: 96) in her niece; Melanoma (age of onset: 61) in her daughter; Other (age of onset: 77) in her paternal uncle; Stroke in her maternal grandmother; Uterine cancer (age of onset: 71) in her daughter. There is no history of Esophageal cancer, Stomach cancer, or Rectal cancer.  ROS:   Please see the history of present illness.     All other systems reviewed and are negative.  EKGs/Labs/Other Studies Reviewed:    The following studies were reviewed today:   EKG:     Recent Labs: 12/02/2020: Magnesium 2.0 03/24/2021: ALT 15; TSH 2.92 04/10/2021: Hemoglobin 12.3; Platelets 252 10/01/2021: BUN 13; Creatinine, Ser 0.67; Potassium 5.0; Sodium 132  Recent Lipid Panel    Component Value Date/Time   CHOL 158 03/24/2021 1116   TRIG 51.0 03/24/2021 1116   HDL 60.70 03/24/2021 1116   CHOLHDL 3 03/24/2021 1116   VLDL 10.2 03/24/2021 1116   LDLCALC 87 03/24/2021 1116     Risk Assessment/Calculations:           Physical Exam:    VS:  BP 135/60   Pulse 93   Ht '5\' 3"'$  (1.6 m)   Wt 131 lb 6.4 oz (59.6 kg)   LMP 02/23/1985   SpO2 98%   BMI 23.28 kg/m     Wt Readings from Last 3 Encounters:  10/01/21 131 lb 6.4 oz (59.6 kg)  07/15/21 130 lb (59 kg)  04/10/21 128 lb (58.1 kg)     GEN:  Well nourished, well developed in no acute distress HEENT: Normal NECK: No JVD; No carotid bruits LYMPHATICS: No lymphadenopathy CARDIAC: RRR,  no murmurs, rubs, gallops RESPIRATORY:  Clear to auscultation without rales, wheezing or rhonchi  ABDOMEN: Soft, non-tender, non-distended MUSCULOSKELETAL:  No edema; No deformity  SKIN: Warm and dry NEUROLOGIC:  Alert and oriented x 3 PSYCHIATRIC:  Normal affect   ASSESSMENT:    1. Dyspnea on exertion   2. Chest pain of uncertain etiology   3. Precordial pain    PLAN:    In order of problems listed above:  Syncope: [place]  presents for follow-up of an episode of syncope.  Most of her symptoms sound like she was dehydrated.  She does have an EKG that shows poor R wave progression, suggestive of possible previous anterior wall myocardial infarction.  That was preceded by severe episode of chest discomfort.  I would like to get an echocardiogram for further evaluation of her LV function.  I would also like to get a coronary CT angiogram for further evaluation of her coronary arteries.  I encouraged her to hydrate better.  Consider adding liquid IV or V8 juice before walking.  I encouraged her to eat a good breakfast before she goes walking.  She will return to see [name] in several months.           Medication Adjustments/Labs and Tests Ordered: Current medicines are reviewed at length with the patient today.  Concerns regarding medicines are outlined above.  Orders Placed This Encounter  Procedures   CT CARDIAC MORPH/PULM VEIN W/CM&W/O CA SCORE   Basic metabolic panel   ECHOCARDIOGRAM COMPLETE   Meds ordered this encounter  Medications   metoprolol tartrate (LOPRESSOR) 100 MG tablet    Sig: Take 1 tablet (100 mg total) by mouth once for 1 dose. Take 1 tablet (100 mg total) two hours prior to CT scan.    Dispense:  1 tablet    Refill:  0    Patient Instructions  Medication Instructions:  Your physician recommends that you continue on your current medications as directed. Please refer to the Current Medication list given to you today.  *If you need a refill on your cardiac medications before your next appointment, please call your pharmacy*   Lab Work: Bmet If you have labs (blood work) drawn today and your tests are completely normal, you will receive your results only by: [place] (if you have MyChart) OR A paper copy in the mail If you have any lab test that is abnormal or we need to change your treatment, we will call you to review the results.   Testing/Procedures: ECHO Your  physician has requested that you have an echocardiogram. Echocardiography is a painless test that uses sound waves to create images of your heart. It provides your doctor with information about the size and shape of your heart and how well your heart's chambers and valves are working. This procedure takes approximately one hour. There are no restrictions for this procedure.   Cardiac CT  Your physician has requested that you have cardiac CT. Cardiac computed tomography (CT) is a painless test that uses an x-ray machine to take clear, detailed pictures of your heart. For further information please visit [email or web address]. Please follow instruction sheet as given.    Follow-Up: At [facility], you and your health needs are our priority.  As part of our continuing mission to provide you with exceptional heart care, we have created designated Provider Care Teams.  These Care Teams include your primary Cardiologist (physician) and Advanced Practice Providers (APPs -  Physician Assistants and Nurse Practitioners)  who all work together to provide you with the care you need, when you need it.  We recommend signing up for the patient portal called "MyChart".  Sign up information is provided on this After Visit Summary.  MyChart is used to connect with patients for Virtual Visits (Telemedicine).  Patients are able to view lab/test results, encounter notes, upcoming appointments, etc.  Non-urgent messages can be sent to your provider as well.   To learn more about what you can do with MyChart, go to [email or web address].    Your next appointment:   3 month(s)  The format for your next appointment:   In Person  Provider:   Jenkins Rouge, MD {   Other Instructions   Your cardiac CT will be scheduled at one of the below locations:   [facility] [address] [place], [place] [address] [phone]  If scheduled at [facility], please arrive at the [facility] and  Children's Entrance (Entrance C2) of [facility] 30 minutes prior to test start time. You can use the FREE valet parking offered at entrance C (encouraged to control the heart rate for the test)  Proceed to the [facility] Radiology Department (first floor) to check-in and test prep.  All radiology patients and guests should use entrance C2 at [facility], accessed from [facility], even though the hospital's physical address listed is [address]    Please follow these instructions carefully (unless otherwise directed): On the Night Before the Test: Be sure to Drink plenty of water. Do not consume any caffeinated/decaffeinated beverages or chocolate 12 hours prior to your test. Do not take any antihistamines 12 hours prior to your test.   On the Day of the Test: Drink plenty of water until 1 hour prior to the test. Do not eat any food 4 hours prior to the test. You may take your regular medications prior to the test.  Take metoprolol (Lopressor) two hours prior to test. HOLD Furosemide/Hydrochlorothiazide morning of the test. FEMALES- please wear underwire-free bra if available, avoid dresses & tight clothing       After the Test: Drink plenty of water. After receiving IV contrast, you may experience a mild flushed feeling. This is normal. On occasion, you may experience a mild rash up to 24 hours after the test. This is not dangerous. If this occurs, you can take Benadryl 25 mg and increase your fluid intake. If you experience trouble breathing, this can be serious. If it is severe call 911 IMMEDIATELY. If it is mild, please call our office. If you take any of these medications: Glipizide/Metformin, Avandament, Glucavance, please do not take 48 hours after completing test unless otherwise instructed.  We will call to schedule your test 2-4 weeks out understanding that some insurance companies will need an authorization prior to the service being  performed.   For non-scheduling related questions, please contact the cardiac imaging nurse navigator should you have any questions/concerns: Marchia Bond, Cardiac Imaging Nurse Navigator Gordy Clement, Cardiac Imaging Nurse Navigator [place] [facility] Direct Office Dial: [phone]   For scheduling needs, including cancellations and rescheduling, please call [place], [phone].          Signed, Mertie Moores, MD  10/01/2021 5:41 PM    [place]

## 2021-09-30 NOTE — Telephone Encounter (Signed)
STAT if patient feels like he/she is going to faint   Are you dizzy now? Just slightly lightheaded  Do you feel faint or have you passed out? no  Do you have any other symptoms? Fatigue and feeling of rubbery legs  Have you checked your HR and BP (record if available)? 139/98 HR 80

## 2021-10-01 ENCOUNTER — Encounter: Payer: Self-pay | Admitting: Cardiovascular Disease

## 2021-10-01 ENCOUNTER — Ambulatory Visit (INDEPENDENT_AMBULATORY_CARE_PROVIDER_SITE_OTHER): Payer: Medicare PPO | Admitting: Cardiovascular Disease

## 2021-10-01 VITALS — BP 135/60 | HR 93 | Ht 63.0 in | Wt 131.4 lb

## 2021-10-01 DIAGNOSIS — R079 Chest pain, unspecified: Secondary | ICD-10-CM | POA: Diagnosis not present

## 2021-10-01 DIAGNOSIS — R072 Precordial pain: Secondary | ICD-10-CM | POA: Diagnosis not present

## 2021-10-01 DIAGNOSIS — R0609 Other forms of dyspnea: Secondary | ICD-10-CM | POA: Diagnosis not present

## 2021-10-01 LAB — BASIC METABOLIC PANEL
BUN/Creatinine Ratio: 19 (ref 12–28)
BUN: 13 mg/dL (ref 8–27)
CO2: 26 mmol/L (ref 20–29)
Calcium: 9.5 mg/dL (ref 8.7–10.3)
Chloride: 94 mmol/L — ABNORMAL LOW (ref 96–106)
Creatinine, Ser: 0.67 mg/dL (ref 0.57–1.00)
Glucose: 96 mg/dL (ref 70–99)
Potassium: 5 mmol/L (ref 3.5–5.2)
Sodium: 132 mmol/L — ABNORMAL LOW (ref 134–144)
eGFR: 85 mL/min/{1.73_m2} (ref >59)

## 2021-10-01 MED ORDER — METOPROLOL TARTRATE 100 MG PO TABS: 100.0000 mg | ORAL_TABLET | Freq: Once | ORAL | 0 refills | Status: DC

## 2021-10-01 NOTE — Patient Instructions (Addendum)
Medication Instructions:  Your physician recommends that you continue on your current medications as directed. Please refer to the Current Medication list given to you today.  *If you need a refill on your cardiac medications before your next appointment, please call your pharmacy*   Lab Work: Bmet If you have labs (blood work) drawn today and your tests are completely normal, you will receive your results only by: [place] (if you have MyChart) OR A paper copy in the mail If you have any lab test that is abnormal or we need to change your treatment, we will call you to review the results.   Testing/Procedures: ECHO Your physician has requested that you have an echocardiogram. Echocardiography is a painless test that uses sound waves to create images of your heart. It provides your doctor with information about the size and shape of your heart and how well your heart's chambers and valves are working. This procedure takes approximately one hour. There are no restrictions for this procedure.   Cardiac CT  Your physician has requested that you have cardiac CT. Cardiac computed tomography (CT) is a painless test that uses an x-ray machine to take clear, detailed pictures of your heart. For further information please visit [email or web address]. Please follow instruction sheet as given.    Follow-Up: At [facility], you and your health needs are our priority.  As part of our continuing mission to provide you with exceptional heart care, we have created designated Provider Care Teams.  These Care Teams include your primary Cardiologist (physician) and Advanced Practice Providers (APPs -  Physician Assistants and Nurse Practitioners) who all work together to provide you with the care you need, when you need it.  We recommend signing up for the patient portal called "MyChart".  Sign up information is provided on this After Visit Summary.  MyChart is used to connect with patients for Virtual  Visits (Telemedicine).  Patients are able to view lab/test results, encounter notes, upcoming appointments, etc.  Non-urgent messages can be sent to your provider as well.   To learn more about what you can do with MyChart, go to [email or web address].    Your next appointment:   3 month(s)  The format for your next appointment:   In Person  Provider:   Jenkins Rouge, MD {   Other Instructions   Your cardiac CT will be scheduled at one of the below locations:   [facility] [address] [place], [place] [address] [phone]  If scheduled at [facility], please arrive at the [facility] and Children's Entrance (Entrance C2) of [facility] 30 minutes prior to test start time. You can use the FREE valet parking offered at entrance C (encouraged to control the heart rate for the test)  Proceed to the [facility] Radiology Department (first floor) to check-in and test prep.  All radiology patients and guests should use entrance C2 at [facility], accessed from [facility], even though the hospital's physical address listed is [address].    Please follow these instructions carefully (unless otherwise directed): On the Night Before the Test: Be sure to Drink plenty of water. Do not consume any caffeinated/decaffeinated beverages or chocolate 12 hours prior to your test. Do not take any antihistamines 12 hours prior to your test.   On the Day of the Test: Drink plenty of water until 1 hour prior to the test. Do not eat any food 4 hours prior to the test.  You may take your regular medications prior to the test.  Take metoprolol (Lopressor) two hours prior to test. HOLD Furosemide/Hydrochlorothiazide morning of the test. FEMALES- please wear underwire-free bra if available, avoid dresses & tight clothing       After the Test: Drink plenty of water. After receiving IV contrast, you may experience a mild flushed  feeling. This is normal. On occasion, you may experience a mild rash up to 24 hours after the test. This is not dangerous. If this occurs, you can take Benadryl 25 mg and increase your fluid intake. If you experience trouble breathing, this can be serious. If it is severe call 911 IMMEDIATELY. If it is mild, please call our office. If you take any of these medications: Glipizide/Metformin, Avandament, Glucavance, please do not take 48 hours after completing test unless otherwise instructed.  We will call to schedule your test 2-4 weeks out understanding that some insurance companies will need an authorization prior to the service being performed.   For non-scheduling related questions, please contact the cardiac imaging nurse navigator should you have any questions/concerns: Marchia Bond, Cardiac Imaging Nurse Navigator Gordy Clement, Cardiac Imaging Nurse Navigator [place] [facility] Direct Office Dial: [phone]   For scheduling needs, including cancellations and rescheduling, please call [place], [phone].

## 2021-10-02 ENCOUNTER — Ambulatory Visit (INDEPENDENT_AMBULATORY_CARE_PROVIDER_SITE_OTHER): Payer: Medicare PPO

## 2021-10-02 ENCOUNTER — Encounter: Payer: Self-pay | Admitting: Internal Medicine

## 2021-10-02 ENCOUNTER — Ambulatory Visit (INDEPENDENT_AMBULATORY_CARE_PROVIDER_SITE_OTHER): Payer: Medicare PPO | Admitting: Internal Medicine

## 2021-10-02 VITALS — BP 138/70 | HR 87 | Temp 98.1°F | Ht 63.0 in | Wt 131.6 lb

## 2021-10-02 DIAGNOSIS — R739 Hyperglycemia, unspecified: Secondary | ICD-10-CM | POA: Diagnosis not present

## 2021-10-02 DIAGNOSIS — E559 Vitamin D deficiency, unspecified: Secondary | ICD-10-CM

## 2021-10-02 DIAGNOSIS — M542 Cervicalgia: Secondary | ICD-10-CM

## 2021-10-02 DIAGNOSIS — E538 Deficiency of other specified B group vitamins: Secondary | ICD-10-CM | POA: Diagnosis not present

## 2021-10-02 DIAGNOSIS — M4312 Spondylolisthesis, cervical region: Secondary | ICD-10-CM | POA: Diagnosis not present

## 2021-10-02 DIAGNOSIS — E785 Hyperlipidemia, unspecified: Secondary | ICD-10-CM

## 2021-10-02 LAB — HEPATIC FUNCTION PANEL
ALT: 17 U/L (ref 0–35)
AST: 28 U/L (ref 0–37)
Albumin: 4.2 g/dL (ref 3.5–5.2)
Alkaline Phosphatase: 75 U/L (ref 39–117)
Bilirubin, Direct: 0.1 mg/dL (ref 0.0–0.3)
Total Bilirubin: 0.6 mg/dL (ref 0.2–1.2)
Total Protein: 7.5 g/dL (ref 6.0–8.3)

## 2021-10-02 LAB — CBC WITH DIFFERENTIAL/PLATELET
Basophils Absolute: 0.1 10*3/uL (ref 0.0–0.1)
Basophils Relative: 0.9 % (ref 0.0–3.0)
Eosinophils Absolute: 0.2 10*3/uL (ref 0.0–0.7)
Eosinophils Relative: 2.9 % (ref 0.0–5.0)
HCT: 36.9 % (ref 36.0–46.0)
Hemoglobin: 12.6 g/dL (ref 12.0–15.0)
Lymphocytes Relative: 17.9 % (ref 12.0–46.0)
Lymphs Abs: 1.1 10*3/uL (ref 0.7–4.0)
MCHC: 34.1 g/dL (ref 30.0–36.0)
MCV: 98.1 fl (ref 78.0–100.0)
Monocytes Absolute: 0.6 10*3/uL (ref 0.1–1.0)
Monocytes Relative: 10 % (ref 3.0–12.0)
Neutro Abs: 4.4 10*3/uL (ref 1.4–7.7)
Neutrophils Relative %: 68.3 % (ref 43.0–77.0)
Platelets: 246 10*3/uL (ref 150.0–400.0)
RBC: 3.77 Mil/uL — ABNORMAL LOW (ref 3.87–5.11)
RDW: 12.3 % (ref 11.5–15.5)
WBC: 6.4 10*3/uL (ref 4.0–10.5)

## 2021-10-02 LAB — LIPID PANEL
Cholesterol: 170 mg/dL (ref 0–200)
HDL: 64.2 mg/dL (ref >39.00)
LDL Cholesterol: 89 mg/dL (ref 0–99)
NonHDL: 105.81
Total CHOL/HDL Ratio: 3
Triglycerides: 83 mg/dL (ref 0.0–149.0)
VLDL: 16.6 mg/dL (ref 0.0–40.0)

## 2021-10-02 LAB — URINALYSIS, ROUTINE W REFLEX MICROSCOPIC
Bilirubin Urine: NEGATIVE
Ketones, ur: NEGATIVE
Nitrite: NEGATIVE
Specific Gravity, Urine: 1.01 (ref 1.000–1.030)
Total Protein, Urine: NEGATIVE
Urine Glucose: NEGATIVE
Urobilinogen, UA: 0.2 (ref 0.0–1.0)
pH: 6 (ref 5.0–8.0)

## 2021-10-02 LAB — BASIC METABOLIC PANEL
BUN: 11 mg/dL (ref 6–23)
CO2: 30 mEq/L (ref 19–32)
Calcium: 9.3 mg/dL (ref 8.4–10.5)
Chloride: 95 mEq/L — ABNORMAL LOW (ref 96–112)
Creatinine, Ser: 0.59 mg/dL (ref 0.40–1.20)
GFR: 81.63 mL/min (ref >60.00)
Glucose, Bld: 93 mg/dL (ref 70–99)
Potassium: 4.3 mEq/L (ref 3.5–5.1)
Sodium: 131 mEq/L — ABNORMAL LOW (ref 135–145)

## 2021-10-02 LAB — VITAMIN B12: Vitamin B-12: 965 pg/mL — ABNORMAL HIGH (ref 211–911)

## 2021-10-02 LAB — TSH: TSH: 3.2 u[IU]/mL (ref 0.35–5.50)

## 2021-10-02 LAB — VITAMIN D 25 HYDROXY (VIT D DEFICIENCY, FRACTURES): VITD: 41.68 ng/mL (ref 30.00–100.00)

## 2021-10-02 LAB — HEMOGLOBIN A1C: Hgb A1c MFr Bld: 5.9 % (ref 4.6–6.5)

## 2021-10-02 NOTE — Progress Notes (Signed)
Patient ID: Miranda Thompson, female   DOB: 03/24/1933, 88 y.o.   MRN: [id number]        Chief Complaint: follow up recent fall with small post head laceration x 3 days ago, neck pain, hyperglycemia, hld       HPI:  Miranda Thompson is a 88 y.o. female here with c/o getting woozy and falling backwards in the home after returned from walking in the heat 3 days ago; has seen cardiology and by report suggested she has mild dehydration; did hit post head with small bleeding laceration that stopped less than 30 min and did not go to ED; HA much improved and no worsening red, swelling to back of head, but does have bilateral anterior neck pain without obvious swelling, rash or other injury.  Has some mild post neck pain as well.  Pt denies chest pain, increased sob or doe, wheezing, orthopnea, PND, increased LE swelling, palpitations, dizziness or syncope.   Pt denies polydipsia, polyuria, or new focal neuro s/s.          Wt Readings from Last 3 Encounters:  10/02/21 131 lb 9.6 oz (59.7 kg)  10/01/21 131 lb 6.4 oz (59.6 kg)  07/15/21 130 lb (59 kg)   BP Readings from Last 3 Encounters:  10/02/21 138/70  10/01/21 135/60  07/15/21 122/68         Past Medical History:  Diagnosis Date   Adenomatous colon polyp    Allergic rhinitis    Anemia    pt states no anemia in her past hx thst she is aware of    Atrial fibrillation ([place])    goes in and out of this rhythm- takes Tenormin   Breast cancer, left breast ([place]) 1994 with lumpectomy   chemo,tamox,radiation   Bronchiectasis with acute exacerbation ([place])    COPD (chronic obstructive pulmonary disease) ([place])    Diverticulosis of colon    DJD (degenerative joint disease)    Dysrhythmia    palpitations   Family history of brain cancer    Family history of colon cancer 02/14/2018   Family history of colon cancer    Family history of malignant neoplasm of gastrointestinal tract    Family history of melanoma    Family history of uterine cancer     Fibromyalgia    GERD (gastroesophageal reflux disease) 02/14/2018   Glaucoma    Hemoptysis    History of pneumonia    Hypercholesteremia    pt denies   Hypertension    Low back pain syndrome    Neuromuscular disorder (H[facility])    hx fibromyalgia    Osteopenia    Pneumonia    hx   Pseudomonas infection    Rotator cuff syndrome of right shoulder    Spinal stenosis    Past Surgical History:  Procedure Laterality Date   CATARACT EXTRACTION Right    COLONOSCOPY     left breast lumpectomy and LN dissection  06/1992   Dr. [name]   LUNG BIOPSY  1968   TSurg---Dr. [name]   LUNG BIOPSY  2012   dr [name]   POLYPECTOMY     REVERSE SHOULDER ARTHROPLASTY Right 08/02/2015   Procedure: RIGHT REVERSE TOTAL SHOULDER ARTHROPLASTY;  Surgeon: [name], MD;  Location: [facility];  Service: Orthopedics;  Laterality: Right;   REVERSE TOTAL SHOULDER ARTHROPLASTY Right 08/02/2015   TONSILLECTOMY     TOTAL KNEE ARTHROPLASTY Right 01/21/2015   Procedure: TOTAL RIGHT KNEE ARTHROPLASTY;  Surgeon: [name], MD;  Location:  [facility];  Service: Orthopedics;  Laterality: Right;   TOTAL KNEE ARTHROPLASTY Left 03/23/2016   Procedure: LEFT TOTAL KNEE ARTHROPLASTY;  Surgeon: [name], MD;  Location: [facility];  Service: Orthopedics;  Laterality: Left;    reports that she quit smoking about 54 years ago. Her smoking use included cigarettes. She has a 10.00 pack-year smoking history. She has never used smokeless tobacco. She reports current alcohol use of about 1.0 standard drink of alcohol per week. She reports that she does not use drugs. family history includes Brain cancer in her maternal uncle; Colon cancer (age of onset: 64) in her father; Colon polyps in her daughter; Heart attack in her mother; Hypertension in her brother; Melanoma (age of onset: 83) in her niece; Melanoma (age of onset: 54) in her daughter; Other (age of onset: 70) in her paternal uncle; Stroke in her maternal grandmother; Uterine cancer (age of  onset: 80) in her daughter. No Known Allergies Current Outpatient Medications on File Prior to Visit  Medication Sig Dispense Refill   albuterol (VENTOLIN HFA) 108 (90 Base) MCG/ACT inhaler Inhale 2 puffs into the lungs every 6 (six) hours as needed for wheezing or shortness of breath. 8 g 6   amoxicillin (AMOXIL) 500 MG capsule Take prior to dental procedures     ASPIRIN 81 PO Take 1 tablet by mouth daily.     Calcium Carb-Cholecalciferol 600-800 MG-UNIT TABS Take 1 tablet by mouth daily.     diltiazem (CARDIZEM CD) 120 MG 24 hr capsule Take 1 capsule (120 mg total) by mouth daily. 90 capsule 3   guaiFENesin (MUCINEX) 600 MG 12 hr tablet Take by mouth 2 (two) times daily.     losartan (COZAAR) 25 MG tablet Take 0.5 tablets (12.5 mg total) by mouth daily. 45 tablet 2   LUMIGAN 0.01 % SOLN      Multiple Vitamins-Minerals (MULTIVITAMIN & MINERAL PO) Take 1 tablet by mouth daily.     nitroGLYCERIN (NITROSTAT) 0.4 MG SL tablet Place 1 tablet (0.4 mg total) under the tongue every 5 (five) minutes as needed for chest pain. 25 tablet 3   Nutritional Supplements (VITAMIN D BOOSTER PO) Take by mouth.     Respiratory Therapy Supplies (FLUTTER) DEVI 1 application by Does not apply route 3 (three) times daily. 1 each 0   Saccharomyces boulardii (FLORASTOR PO) Take 1 capsule by mouth daily.     Tiotropium Bromide-Olodaterol (STIOLTO RESPIMAT) 2.5-2.5 MCG/ACT AERS Inhale 2 puffs into the lungs daily. 4 g 7   Zinc 30 MG CAPS      metoprolol tartrate (LOPRESSOR) 100 MG tablet Take 1 tablet (100 mg total) by mouth once for 1 dose. Take 1 tablet (100 mg total) two hours prior to CT scan. 1 tablet 0   No current facility-administered medications on file prior to visit.        ROS:  All others reviewed and negative.  Objective        PE:  BP 138/70 (BP Location: Right Arm, Patient Position: Sitting, Cuff Size: Normal)   Pulse 87   Temp 98.1 F (36.7 C) (Oral)   Ht '5\' 3"'$  (1.6 m)   Wt 131 lb 9.6 oz (59.7  kg)   LMP 02/23/1985   SpO2 97%   BMI 23.31 kg/m                 Constitutional: Pt appears in NAD               HENT:  Head: NCAT.                Right Ear: External ear normal.                 Left Ear: External ear normal.                Eyes: . Pupils are equal, round, and reactive to light. Conjunctivae and EOM are normal               Nose: without d/c or deformity               Neck: Neck supple. Gross normal ROM but has bilateral SCM tenderness               Cardiovascular: Normal rate and regular rhythm.                 Pulmonary/Chest: Effort normal and breath sounds without rales or wheezing.                Abd:  Soft, NT, ND, + BS, no organomegaly               Neurological: Pt is alert. At baseline orientation, motor grossly intact               Skin: Skin is warm. No rashes, no other new lesions, LE edema - none               Psychiatric: Pt behavior is normal without agitation   Micro: none  Cardiac tracings I have personally interpreted today:  none  Pertinent Radiological findings (summarize): none   Lab Results  Component Value Date   WBC 6.4 10/02/2021   HGB 12.6 10/02/2021   HCT 36.9 10/02/2021   PLT 246.0 10/02/2021   GLUCOSE 93 10/02/2021   CHOL 170 10/02/2021   TRIG 83.0 10/02/2021   HDL 64.20 10/02/2021   LDLCALC 89 10/02/2021   ALT 17 10/02/2021   AST 28 10/02/2021   NA 131 (L) 10/02/2021   K 4.3 10/02/2021   CL 95 (L) 10/02/2021   CREATININE 0.59 10/02/2021   BUN 11 10/02/2021   CO2 30 10/02/2021   TSH 3.20 10/02/2021   INR 1.06 01/14/2015   HGBA1C 5.9 10/02/2021   Assessment/Plan:  Miranda Thompson is a 88 y.o. White or Caucasian [1] female with  has a past medical history of Adenomatous colon polyp, Allergic rhinitis, Anemia, Atrial fibrillation ([place]), Breast cancer, left breast ([place]) (1994 with lumpectomy), Bronchiectasis with acute exacerbation ([place]), COPD (chronic obstructive pulmonary disease) ([place]), Diverticulosis of colon, DJD  (degenerative joint disease), Dysrhythmia, Family history of brain cancer, Family history of colon cancer (02/14/2018), Family history of colon cancer, Family history of malignant neoplasm of gastrointestinal tract, Family history of melanoma, Family history of uterine cancer, Fibromyalgia, GERD (gastroesophageal reflux disease) (02/14/2018), Glaucoma, Hemoptysis, History of pneumonia, Hypercholesteremia, Hypertension, Low back pain syndrome, Neuromuscular disorder ([place]), Osteopenia, Pneumonia, Pseudomonas infection, Rotator cuff syndrome of right shoulder, and Spinal stenosis.  Elevated lipids Lab Results  Component Value Date   LDLCALC 89 10/02/2021   Stable, pt to continue current low chol diet   Hyperglycemia Lab Results  Component Value Date   HGBA1C 5.9 10/02/2021   Stable, pt to continue current medical treatment  - diet, wt control, excercise   Cervicalgia C/w whiplash post fall with bilateral scm pain, but also post neck pain - for xray  Followup: Return if symptoms worsen or fail to improve.  Cathlean Cower, MD 10/04/2021 7:36 PM [facility]

## 2021-10-02 NOTE — Telephone Encounter (Signed)
Needs ROV, thanks

## 2021-10-02 NOTE — Patient Instructions (Signed)
Please continue all other medications as before, and refills have been done if requested.  Please have the pharmacy call with any other refills you may need.  Please keep your appointments with your specialists as you may have planned  Please go to the XRAY Department in the first floor for the x-ray testing  You will be contacted by phone if any changes need to be made immediately.  Otherwise, you will receive a letter about your results with an explanation, but please check with MyChart first.  Please remember to sign up for MyChart if you have not done so, as this will be important to you in the future with finding out test results, communicating by private email, and scheduling acute appointments online when needed.    . 

## 2021-10-04 ENCOUNTER — Encounter: Payer: Self-pay | Admitting: Internal Medicine

## 2021-10-04 NOTE — Assessment & Plan Note (Signed)
C/w whiplash post fall with bilateral scm pain, but also post neck pain - for xray

## 2021-10-04 NOTE — Assessment & Plan Note (Signed)
Lab Results  Component Value Date   LDLCALC 89 10/02/2021   Stable, pt to continue current low chol diet

## 2021-10-04 NOTE — Assessment & Plan Note (Signed)
Lab Results  Component Value Date   HGBA1C 5.9 10/02/2021   Stable, pt to continue current medical treatment  - diet, wt control, excercise

## 2021-10-10 ENCOUNTER — Ambulatory Visit (HOSPITAL_COMMUNITY): Payer: Medicare PPO | Attending: Cardiology

## 2021-10-10 DIAGNOSIS — R079 Chest pain, unspecified: Secondary | ICD-10-CM | POA: Insufficient documentation

## 2021-10-10 DIAGNOSIS — R0609 Other forms of dyspnea: Secondary | ICD-10-CM | POA: Diagnosis not present

## 2021-10-10 DIAGNOSIS — R072 Precordial pain: Secondary | ICD-10-CM | POA: Insufficient documentation

## 2021-10-11 LAB — ECHOCARDIOGRAM COMPLETE
AV Mean grad: 2 mmHg
AV Peak grad: 3.9 mmHg
Ao pk vel: 0.98 m/s
Area-P 1/2: 2.36 cm2
MV M vel: 5.35 m/s
MV Peak grad: 114.5 mmHg
Radius: 0.4 cm
S' Lateral: 2.2 cm

## 2021-10-16 ENCOUNTER — Telehealth (HOSPITAL_COMMUNITY): Payer: Self-pay | Admitting: *Deleted

## 2021-10-16 NOTE — Telephone Encounter (Signed)
Attempted to call patient regarding upcoming cardiac CT appointment. °Left message on voicemail with name and callback number ° °Robby Pirani RN Navigator Cardiac Imaging °[facility] °[phone] Office °[phone] Cell ° °

## 2021-10-16 NOTE — Telephone Encounter (Signed)
Reaching out to patient to offer assistance regarding upcoming cardiac imaging study; pt verbalizes understanding of appt date/time, parking situation and where to check in, pre-test NPO status and medications ordered, and verified current allergies; name and call back number provided for further questions should they arise  Miranda Clement RN Navigator Cardiac Imaging [facility] [phone] office [phone] cell  Patient to take '100mg'$  metoprolol tartrate two hours prior to cardiac CT scan. She is aware to arrive at 12pm.

## 2021-10-17 ENCOUNTER — Ambulatory Visit (HOSPITAL_COMMUNITY)
Admission: RE | Admit: 2021-10-17 | Discharge: 2021-10-17 | Disposition: A | Discharge status: Home / Self Care | Payer: Medicare PPO | Source: Ambulatory Visit | Attending: Cardiovascular Disease | Admitting: Cardiovascular Disease

## 2021-10-17 DIAGNOSIS — R072 Precordial pain: Secondary | ICD-10-CM | POA: Insufficient documentation

## 2021-10-17 MED ORDER — NITROGLYCERIN 0.4 MG SL SUBL: 0.8000 mg | SUBLINGUAL_TABLET | Freq: Once | SUBLINGUAL | Status: AC

## 2021-10-17 MED ORDER — IOHEXOL 350 MG/ML SOLN
100.0000 mL | Freq: Once | INTRAVENOUS | Status: AC | PRN
  Administered 2021-10-17: 100 mL via INTRAVENOUS

## 2021-10-17 MED ORDER — METOPROLOL TARTRATE 5 MG/5ML IV SOLN
5.0000 mg | INTRAVENOUS | Status: DC | PRN
  Administered 2021-10-17 (×3): 5 mg via INTRAVENOUS

## 2021-10-17 MED ORDER — DILTIAZEM HCL 25 MG/5ML IV SOLN
INTRAVENOUS | Status: AC
  Administered 2021-10-17: 5 mg via INTRAVENOUS
  Filled 2021-10-17: qty 5

## 2021-10-17 MED ORDER — METOPROLOL TARTRATE 5 MG/5ML IV SOLN
INTRAVENOUS | Status: AC
  Filled 2021-10-17: qty 10

## 2021-10-17 MED ORDER — METOPROLOL TARTRATE 5 MG/5ML IV SOLN
INTRAVENOUS | Status: AC
  Filled 2021-10-17: qty 5

## 2021-10-17 MED ORDER — NITROGLYCERIN 0.4 MG SL SUBL
SUBLINGUAL_TABLET | SUBLINGUAL | Status: AC
  Administered 2021-10-17: 0.8 mg via SUBLINGUAL
  Filled 2021-10-17: qty 2

## 2021-10-17 MED ORDER — DILTIAZEM HCL 25 MG/5ML IV SOLN: 5.0000 mg | INTRAVENOUS | Status: DC | PRN

## 2021-10-20 ENCOUNTER — Encounter: Payer: Self-pay | Admitting: Cardiovascular Disease

## 2021-10-22 ENCOUNTER — Other Ambulatory Visit (HOSPITAL_COMMUNITY): Payer: Medicare PPO

## 2021-10-26 ENCOUNTER — Encounter: Payer: Self-pay | Admitting: Internal Medicine

## 2021-10-28 NOTE — Telephone Encounter (Signed)
I can see pt in follow up [facility] has CAD on CT scan   Plaquing appears mild, nonflow limiting PA is a little dilated    Echo shows RV and LV are normal Follow  She needs tighter control of lipids    What hasa she tried?   Goal is LDL 70 or less

## 2021-10-29 ENCOUNTER — Encounter: Payer: Self-pay | Admitting: Pulmonary Disease

## 2021-10-29 NOTE — Telephone Encounter (Signed)
I called the pt in response to her My Chart messages and made her an appt for 12/11/21.   I went over her Ca Score with her... she says she has never taken any chol meds... last LDL 86... she says she is happy with her Lipid numbers for her age.. I explained to her that meds may help her with optimal control... she says she would rather wait and discuss with Dr Harrington Challenger at her [facility].   Pt says her husband has just been diagnosed with cancer and she is really busy helping to take care of him for now but her and her kids concern is:  7. 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   I will forward to Dr Harrington Challenger for her review and advice.

## 2021-10-30 DIAGNOSIS — Z1231 Encounter for screening mammogram for malignant neoplasm of breast: Secondary | ICD-10-CM | POA: Diagnosis not present

## 2021-10-30 NOTE — Telephone Encounter (Signed)
Dr. Valeta Harms, please see pt's message. She would like you to review her recent cardiac scan. Thanks.

## 2021-10-31 ENCOUNTER — Encounter: Payer: Self-pay | Admitting: Obstetrics and Gynecology

## 2021-11-05 NOTE — Telephone Encounter (Signed)
Reviewed  Yes, things can wait. The pulmonary artery is probably dilated because of her lung condtion  Has been like that probably for awhile   I dont think it will change much.

## 2021-11-15 ENCOUNTER — Encounter: Payer: Self-pay | Admitting: Pulmonary Disease

## 2021-11-15 DIAGNOSIS — R918 Other nonspecific abnormal finding of lung field: Secondary | ICD-10-CM

## 2021-11-17 NOTE — Telephone Encounter (Signed)
Mychart message sent by pt: [facility] (supporting Garner Nash, DO) 2 days ago    Hi Dr. Valeta Harms,   I haven't  received notice from your office regarding my annual CT scan and scheduling of an office visit.  Do you think that it is  time to schedule the CT and office visit.   Thanks,   Miranda Thompson 1933-02-26   Dr. Valeta Harms, please advise on this for pt. Checked and saw that no recent CT order has been placed.

## 2021-11-21 ENCOUNTER — Other Ambulatory Visit: Payer: Self-pay | Admitting: Cardiovascular Disease

## 2021-11-24 DIAGNOSIS — D2262 Melanocytic nevi of left upper limb, including shoulder: Secondary | ICD-10-CM | POA: Diagnosis not present

## 2021-11-24 DIAGNOSIS — I8391 Asymptomatic varicose veins of right lower extremity: Secondary | ICD-10-CM | POA: Diagnosis not present

## 2021-11-24 DIAGNOSIS — L814 Other melanin hyperpigmentation: Secondary | ICD-10-CM | POA: Diagnosis not present

## 2021-11-24 DIAGNOSIS — D485 Neoplasm of uncertain behavior of skin: Secondary | ICD-10-CM | POA: Diagnosis not present

## 2021-11-24 DIAGNOSIS — I8392 Asymptomatic varicose veins of left lower extremity: Secondary | ICD-10-CM | POA: Diagnosis not present

## 2021-11-24 DIAGNOSIS — L821 Other seborrheic keratosis: Secondary | ICD-10-CM | POA: Diagnosis not present

## 2021-12-01 ENCOUNTER — Encounter: Payer: Self-pay | Admitting: Internal Medicine

## 2021-12-01 DIAGNOSIS — R519 Headache, unspecified: Secondary | ICD-10-CM

## 2021-12-02 ENCOUNTER — Encounter: Payer: Self-pay | Admitting: Internal Medicine

## 2021-12-03 ENCOUNTER — Ambulatory Visit
Admission: RE | Admit: 2021-12-03 | Discharge: 2021-12-03 | Disposition: A | Discharge status: Home / Self Care | Payer: Medicare PPO | Source: Ambulatory Visit | Attending: Internal Medicine | Admitting: Internal Medicine

## 2021-12-03 DIAGNOSIS — R519 Headache, unspecified: Secondary | ICD-10-CM

## 2021-12-03 DIAGNOSIS — R55 Syncope and collapse: Secondary | ICD-10-CM | POA: Diagnosis not present

## 2021-12-03 DIAGNOSIS — S0990XA Unspecified injury of head, initial encounter: Secondary | ICD-10-CM | POA: Diagnosis not present

## 2021-12-03 DIAGNOSIS — H538 Other visual disturbances: Secondary | ICD-10-CM | POA: Diagnosis not present

## 2021-12-03 DIAGNOSIS — R42 Dizziness and giddiness: Secondary | ICD-10-CM | POA: Diagnosis not present

## 2021-12-09 ENCOUNTER — Ambulatory Visit
Admission: RE | Admit: 2021-12-09 | Discharge: 2021-12-09 | Disposition: A | Discharge status: Home / Self Care | Payer: Medicare PPO | Source: Ambulatory Visit | Attending: Pulmonary Disease | Admitting: Pulmonary Disease

## 2021-12-09 DIAGNOSIS — R911 Solitary pulmonary nodule: Secondary | ICD-10-CM | POA: Diagnosis not present

## 2021-12-09 DIAGNOSIS — J479 Bronchiectasis, uncomplicated: Secondary | ICD-10-CM | POA: Diagnosis not present

## 2021-12-09 DIAGNOSIS — R918 Other nonspecific abnormal finding of lung field: Secondary | ICD-10-CM

## 2021-12-09 DIAGNOSIS — I700 Atherosclerosis of aorta: Secondary | ICD-10-CM | POA: Diagnosis not present

## 2021-12-10 DIAGNOSIS — H2512 Age-related nuclear cataract, left eye: Secondary | ICD-10-CM | POA: Diagnosis not present

## 2021-12-10 DIAGNOSIS — H04123 Dry eye syndrome of bilateral lacrimal glands: Secondary | ICD-10-CM | POA: Diagnosis not present

## 2021-12-10 DIAGNOSIS — H52203 Unspecified astigmatism, bilateral: Secondary | ICD-10-CM | POA: Diagnosis not present

## 2021-12-10 DIAGNOSIS — H35372 Puckering of macula, left eye: Secondary | ICD-10-CM | POA: Diagnosis not present

## 2021-12-10 DIAGNOSIS — H43813 Vitreous degeneration, bilateral: Secondary | ICD-10-CM | POA: Diagnosis not present

## 2021-12-10 DIAGNOSIS — H401122 Primary open-angle glaucoma, left eye, moderate stage: Secondary | ICD-10-CM | POA: Diagnosis not present

## 2021-12-10 DIAGNOSIS — H524 Presbyopia: Secondary | ICD-10-CM | POA: Diagnosis not present

## 2021-12-10 NOTE — Progress Notes (Unsigned)
Cardiology Office Note   Date:  12/11/2021   ID:  Miranda Thompson, DOB 08/11/1933, MRN [id number]  PCP:  Miranda Borg, MD  Cardiologist:   Miranda. Johnsie Cancel, MD   Patient presents for evaluation of CP    HP Pt is an 84 yow who is usually followed by Miranda Thompson in clinc   Hx of atherosclerosis of aorta, HTN, bronchiectasis , PACs, PVCs.     I saw the pt in Feb 2023  She was seen by Miranda Thompson in Aug 2023 for dyspnea.   Also had syncope      With this set up for a CT coronary angiogram and echo   CT showed mild to mod CAD   Note that R PA was dilated    Echo showed normal LV and RV systolic funciton     Since seen she denies CP    Breathing is stable    She is concerned by CT findings of PA   No further syncope       Past Medical History:  Diagnosis Date   Adenomatous colon polyp    Allergic rhinitis    Anemia    pt states no anemia in her past hx thst she is aware of    Atrial fibrillation ([place])    goes in and out of this rhythm- takes Tenormin   Breast cancer, left breast ([place]) 1994 with lumpectomy   chemo,tamox,radiation   Bronchiectasis with acute exacerbation ([place])    COPD (chronic obstructive pulmonary disease) ([place])    Diverticulosis of colon    DJD (degenerative joint disease)    Dysrhythmia    palpitations   Family history of brain cancer    Family history of colon cancer 02/14/2018   Family history of colon cancer    Family history of malignant neoplasm of gastrointestinal tract    Family history of melanoma    Family history of uterine cancer    Fibromyalgia    GERD (gastroesophageal reflux disease) 02/14/2018   Glaucoma    Hemoptysis    History of pneumonia    Hypercholesteremia    pt denies   Hypertension    Low back pain syndrome    Neuromuscular disorder ([place])    hx fibromyalgia    Osteopenia    Pneumonia    hx   Pseudomonas infection    Rotator cuff syndrome of right shoulder    Spinal stenosis     Past Surgical History:  Procedure Laterality  Date   CATARACT EXTRACTION Right    COLONOSCOPY     left breast lumpectomy and LN dissection  06/1992   Miranda. Lucia Thompson   LUNG BIOPSY  1968   TSurg---Miranda Thompson   LUNG BIOPSY  2012   Miranda Thompson   POLYPECTOMY     REVERSE SHOULDER ARTHROPLASTY Right 08/02/2015   Procedure: RIGHT REVERSE TOTAL SHOULDER ARTHROPLASTY;  Surgeon: Miranda Cedars, MD;  Location: [facility];  Service: Orthopedics;  Laterality: Right;   REVERSE TOTAL SHOULDER ARTHROPLASTY Right 08/02/2015   TONSILLECTOMY     TOTAL KNEE ARTHROPLASTY Right 01/21/2015   Procedure: TOTAL RIGHT KNEE ARTHROPLASTY;  Surgeon: Miranda Cancel, MD;  Location[facility];  Service: Orthopedics;  Laterality: Right;   TOTAL KNEE ARTHROPLASTY Left 03/23/2016   Procedure: LEFT TOTAL KNEE ARTHROPLASTY;  Surgeon: Miranda Cancel, MD;  [facility];  Service: Orthopedics;  Laterality: Left;     Current Outpatient Medications  Medication Sig Dispense Refill   albuterol (VENTOLIN HFA)  108 (90 Base) MCG/ACT inhaler Inhale 2 puffs into the lungs every 6 (six) hours as needed for wheezing or shortness of breath. 8 g 6   amoxicillin (AMOXIL) 500 MG capsule Take prior to dental procedures     ASPIRIN 81 PO Take 1 tablet by mouth daily.     Calcium Carb-Cholecalciferol 600-800 MG-UNIT TABS Take 1 tablet by mouth daily.     diltiazem (CARDIZEM CD) 120 MG 24 hr capsule TAKE 1 CAPSULE BY MOUTH EVERY DAY 90 capsule 3   guaiFENesin (MUCINEX) 600 MG 12 hr tablet Take by mouth 2 (two) times daily.     losartan (COZAAR) 25 MG tablet Take 0.5 tablets (12.5 mg total) by mouth daily. 45 tablet 2   LUMIGAN 0.01 % SOLN      Multiple Vitamins-Minerals (MULTIVITAMIN & MINERAL PO) Take 1 tablet by mouth daily.     nitroGLYCERIN (NITROSTAT) 0.4 MG SL tablet Place 1 tablet (0.4 mg total) under the tongue every 5 (five) minutes as needed for chest pain. 25 tablet 3   Nutritional Supplements (VITAMIN D BOOSTER PO) Take by mouth.     Respiratory Therapy Supplies (FLUTTER) DEVI 1 application by  Does not apply route 3 (three) times daily. 1 each 0   SODIUM FLUORIDE 5000 SENSITIVE 1.1-5 % GEL Take by mouth as directed.     Tiotropium Bromide-Olodaterol (STIOLTO RESPIMAT) 2.5-2.5 MCG/ACT AERS Inhale 2 puffs into the lungs daily. 4 g 7   Zinc 30 MG CAPS      metoprolol tartrate (LOPRESSOR) 100 MG tablet Take 1 tablet (100 mg total) by mouth once for 1 dose. Take 1 tablet (100 mg total) two hours prior to CT scan. 1 tablet 0   Saccharomyces boulardii (FLORASTOR PO) Take 1 capsule by mouth daily. (Patient not taking: Reported on 12/11/2021)     No current facility-administered medications for this visit.    Allergies:   Patient has no known allergies.    Social History:  The patient  reports that she quit smoking about 54 years ago. Her smoking use included cigarettes. She has a 10.00 pack-year smoking history. She has never used smokeless tobacco. She reports current alcohol use of about 1.0 standard drink of alcohol per week. She reports that she does not use drugs.   Family History:  The patient's family history includes Brain cancer in her maternal uncle; Colon cancer (age of onset: 10) in her father; Colon polyps in her daughter; Heart attack in her mother; Hypertension in her brother; Melanoma (age of onset: 37) in her niece; Melanoma (age of onset: 40) in her daughter; Other (age of onset: 87) in her paternal uncle; Stroke in her maternal grandmother; Uterine cancer (age of onset: 67) in her daughter.    ROS:  Please see the history of present illness.  Otherwise, review of systems are positive for none. All other systems are reviewed and negative.    PHYSICAL EXAM: VS:  BP (!) 140/70   Pulse 86   Ht 5' 3.75" (1.619 m)   Wt 130 lb 6.4 oz (59.1 kg)   LMP 02/23/1985   SpO2 96%   BMI 22.56 kg/m  , BMI Body mass index is 22.56 kg/m. Thin elderly female in NAD  HEENT: normal Neck JVP normal no bruits no thyromegaly Lungs clear to auscul Heart:  S1/S2 no murmur,  PMI  normal Abdomen: benighn, BS positve, no tenderness no bruit.  No HSM or HJR Distal pulses intact with no bruits No LE edema Neuro  non-focal Skin warm and dry No muscular weakness     EKG:  EKG not done   Echo   Aug 2023  Left ventricular ejection fraction, by estimation, is 60 to 65%. The left ventricle has normal function. The left ventricle has no regional wall motion abnormalities. Left ventricular diastolic parameters were normal. 1. Right ventricular systolic function is normal. The right ventricular size is normal. There is normal pulmonary artery systolic pressure. The estimated right ventricular systolic pressure is 51.8 mmHg. 2. 3. Right atrial size was mildly dilated. The mitral valve is normal in structure. Trivial mitral valve regurgitation. No evidence of mitral stenosis. 4. The aortic valve is tricuspid. There is mild calcification of the aortic valve. Aortic valve regurgitation is not visualized. Aortic valve sclerosis is present, with no evidence of aortic valve stenosis. 5. The inferior vena cava is normal in size with greater than 50% respiratory variability, suggesting right atrial pressure of 3 mmHg.   CT coronary angiogram  IMPRESSION: 1. Chronic extensive cylindrical and varicoid bronchiectasis throughout the visualized lungs with associated bandlike regions of scarring, patchy tree-in-bud opacities and scattered foci of mucoid impaction, not appreciably changed since 12/06/2020 chest CT. Findings are most compatible with chronic atypical mycobacterial infection (MAI). 2. Disproportionately dilated right pulmonary artery, cannot exclude pulmonary arterial hypertension. 3. Aortic Atherosclerosis (ICD10-I70.0).  Cardiac  IMPRESSION: 1. Coronary calcium score of 323. Percentile not available for age>84   2. Normal coronary origin with right dominance.   3. Nonobstructive CAD   4. Mixed plaque in proximal LAD causes mild (25-49%) stenosis    5. Mixed plaque in proximal LCX causes mild (25-49%) stenosis   6. Mixed plaque in proximal RCA causes minimal (0-24%) stenosis.   7. Right pulmonary artery aneurysm measuring 4m      Recent Labs: 10/02/2021: ALT 17; BUN 11; Creatinine, Ser 0.59; Hemoglobin 12.6; Platelets 246.0; Potassium 4.3; Sodium 131; TSH 3.20    Lipid Panel    Component Value Date/Time   CHOL 170 10/02/2021 1547   TRIG 83.0 10/02/2021 1547   HDL 64.20 10/02/2021 1547   CHOLHDL 3 10/02/2021 1547   VLDL 16.6 10/02/2021 1547   LDLCALC 89 10/02/2021 1547     Wt Readings from Last 3 Encounters:  12/11/21 130 lb 6.4 oz (59.1 kg)  10/02/21 131 lb 9.6 oz (59.7 kg)  10/01/21 131 lb 6.4 oz (59.6 kg)    Other studies Reviewed: Additional studies/ records that were reviewed today include: . Review of the above records demonstrates:   Lower extremity doppler studies 01/10/20:   Right: Resting right ankle-brachial index is within normal range. No  evidence of significant right lower extremity arterial disease. The right  toe-brachial index is normal.   Left: Resting left ankle-brachial index is within normal range. No  evidence of significant left lower extremity arterial disease. The left  toe-brachial index is normal.  ASSESSMENT AND PLAN:  1  CAD  Mild to moderate on CT scan   Pt denies CP at present   May have been due to pulmonary exacerbation   Breathing is back to baseline     2  Syncope  no recurrence    3  Rhythm  will set up for a Zio patch    4  Enlarged R PA  Reassured patient   No evid on echo for pulmonary HTN       5.  HTN:  BP is OK for age   Follow     6  HL  Will start Crestor 5 given CT findings   Follow labs in a couple months   7  -Followed by Miranda. Valeta Harms with pulmonary medicine     Current medicines are reviewed at length with the patient today.  The patient does not have concerns regarding medicines.  Labs/ tests ordered today include: None  Orders Placed This  Encounter  Procedures   Lipid Profile   Hepatic function panel   Ambulatory referral to [facility] (3-14 DAYS)    Disposition:   FU in a year   Signed, Dorris Carnes, MD  12/11/2021 9:57 PM    [address], [place], [place]  [address] Phone: [phone]; Fax: [phone]

## 2021-12-11 ENCOUNTER — Encounter: Payer: Self-pay | Admitting: Internal Medicine

## 2021-12-11 ENCOUNTER — Ambulatory Visit: Payer: Medicare PPO | Attending: Internal Medicine | Admitting: Internal Medicine

## 2021-12-11 ENCOUNTER — Ambulatory Visit (INDEPENDENT_AMBULATORY_CARE_PROVIDER_SITE_OTHER): Payer: Medicare PPO

## 2021-12-11 ENCOUNTER — Ambulatory Visit: Payer: Medicare PPO | Admitting: Internal Medicine

## 2021-12-11 ENCOUNTER — Other Ambulatory Visit: Payer: Medicare PPO

## 2021-12-11 VITALS — BP 140/70 | HR 86 | Ht 63.75 in | Wt 130.4 lb

## 2021-12-11 DIAGNOSIS — M542 Cervicalgia: Secondary | ICD-10-CM | POA: Diagnosis not present

## 2021-12-11 DIAGNOSIS — E785 Hyperlipidemia, unspecified: Secondary | ICD-10-CM

## 2021-12-11 DIAGNOSIS — Z79899 Other long term (current) drug therapy: Secondary | ICD-10-CM | POA: Diagnosis not present

## 2021-12-11 DIAGNOSIS — R42 Dizziness and giddiness: Secondary | ICD-10-CM | POA: Diagnosis not present

## 2021-12-11 NOTE — Progress Notes (Unsigned)
Enrolled for [employer] to mail a ZIO XT long term holter monitor to the patients address on file.  

## 2021-12-11 NOTE — Patient Instructions (Signed)
Medication Instructions:  Crestor 5 mg daily  *If you need a refill on your cardiac medications before your next appointment, please call your pharmacy*   Lab Work: Lipid and hepatic in 8 weeks   If you have labs (blood work) drawn today and your tests are completely normal, you will receive your results only by: [place] (if you have MyChart) OR A paper copy in the mail If you have any lab test that is abnormal or we need to change your treatment, we will call you to review the results.   Testing/Procedures: [place]- Long Term Monitor Instructions  Your physician has requested you wear a ZIO patch monitor for 14 days.  This is a single patch monitor. [employer] supplies one patch monitor per enrollment. Additional stickers are not available. Please do not apply patch if you will be having a Nuclear Stress Test,  Echocardiogram, Cardiac CT, MRI, or Chest Xray during the period you would be wearing the  monitor. The patch cannot be worn during these tests. You cannot remove and re-apply the  ZIO XT patch monitor.  Your ZIO patch monitor will be mailed 3 day USPS to your address on file. It may take 3-5 days  to receive your monitor after you have been enrolled.  Once you have received your monitor, please review the enclosed instructions. Your monitor  has already been registered assigning a specific monitor serial # to you.  Billing and Patient Assistance Program Information  We have supplied [employer] with any of your insurance information on file for billing purposes. [employer] offers a sliding scale Patient Assistance Program for patients that do not have  insurance, or whose insurance does not completely cover the cost of the ZIO monitor.  You must apply for the Patient Assistance Program to qualify for this discounted rate.  To apply, please call [employer] at [phone], select option 4, select option 2, ask to apply for  Patient Assistance Program. Theodore Demark will ask your  household income, and how many people  are in your household. They will quote your out-of-pocket cost based on that information.  [employer] will also be able to set up a 81-month interest-free payment plan if needed.  Applying the monitor   Shave hair from upper left chest.  Hold abrader disc by orange tab. Rub abrader in 40 strokes over the upper left chest as  indicated in your monitor instructions.  Clean area with 4 enclosed alcohol pads. Let dry.  Apply patch as indicated in monitor instructions. Patch will be placed under collarbone on left  side of chest with arrow pointing upward.  Rub patch adhesive wings for 2 minutes. Remove white label marked "1". Remove the white  label marked "2". Rub patch adhesive wings for 2 additional minutes.  While looking in a mirror, press and release button in center of patch. A small green light will  flash 3-4 times. This will be your only indicator that the monitor has been turned on.  Do not shower for the first 24 hours. You may shower after the first 24 hours.  Press the button if you feel a symptom. You will hear a small click. Record Date, Time and  Symptom in the Patient Logbook.  When you are ready to remove the patch, follow instructions on the last 2 pages of Patient  Logbook. Stick patch monitor onto the last page of Patient Logbook.  Place Patient Logbook in the blue and white box. Use locking tab on box and tape  box closed  securely. The blue and white box has prepaid postage on it. Please place it in the mailbox as  soon as possible. Your physician should have your test results approximately 7 days after the  monitor has been mailed back to [facility].  Call [place] at [phone] if you have questions regarding  your ZIO XT patch monitor. Call them immediately if you see an orange light blinking on your  monitor.  If your monitor falls off in less than 4 days, contact our Monitor department at [phone].   If your monitor becomes loose or falls off after 4 days call [employer] at [phone] for  suggestions on securing your monitor    Follow-Up: At [facility], you and your health needs are our priority.  As part of our continuing mission to provide you with exceptional heart care, we have created designated Provider Care Teams.  These Care Teams include your primary Cardiologist (physician) and Advanced Practice Providers (APPs -  Physician Assistants and Nurse Practitioners) who all work together to provide you with the care you need, when you need it.  We recommend signing up for the patient portal called "MyChart".  Sign up information is provided on this After Visit Summary.  MyChart is used to connect with patients for Virtual Visits (Telemedicine).  Patients are able to view lab/test results, encounter notes, upcoming appointments, etc.  Non-urgent messages can be sent to your provider as well.   To learn more about what you can do with MyChart, go to [email or web address].     Important Information About [place]

## 2021-12-12 MED ORDER — ROSUVASTATIN CALCIUM 5 MG PO TABS: 5.0000 mg | ORAL_TABLET | Freq: Every day | ORAL | 3 refills | Status: DC

## 2021-12-12 NOTE — Telephone Encounter (Signed)
Per OV note on 12/11/21 by Harrington Challenger:  6  HL  Will start Crestor 5 given CT findings   Follow labs in a couple months   Will send medication to pharmacy on file as it doesn't appear it was sent in.

## 2021-12-18 DIAGNOSIS — Z79899 Other long term (current) drug therapy: Secondary | ICD-10-CM

## 2021-12-18 DIAGNOSIS — R42 Dizziness and giddiness: Secondary | ICD-10-CM

## 2021-12-18 DIAGNOSIS — E785 Hyperlipidemia, unspecified: Secondary | ICD-10-CM | POA: Diagnosis not present

## 2021-12-24 ENCOUNTER — Ambulatory Visit: Payer: Medicare PPO | Admitting: Pulmonary Disease

## 2021-12-30 ENCOUNTER — Ambulatory Visit: Payer: Medicare PPO | Admitting: Cardiovascular Disease

## 2021-12-31 ENCOUNTER — Ambulatory Visit: Payer: Medicare PPO | Admitting: Pulmonary Disease

## 2022-01-02 ENCOUNTER — Encounter: Payer: Self-pay | Admitting: Nurse Practitioner

## 2022-01-02 ENCOUNTER — Ambulatory Visit (INDEPENDENT_AMBULATORY_CARE_PROVIDER_SITE_OTHER): Payer: Medicare PPO | Admitting: Nurse Practitioner

## 2022-01-02 VITALS — BP 118/64 | HR 83 | Ht 63.0 in | Wt 132.0 lb

## 2022-01-02 DIAGNOSIS — J479 Bronchiectasis, uncomplicated: Secondary | ICD-10-CM

## 2022-01-02 DIAGNOSIS — J449 Chronic obstructive pulmonary disease, unspecified: Secondary | ICD-10-CM | POA: Diagnosis not present

## 2022-01-02 DIAGNOSIS — R0609 Other forms of dyspnea: Secondary | ICD-10-CM

## 2022-01-02 NOTE — Patient Instructions (Addendum)
Continue Albuterol inhaler 2 puffs every 6 hours as needed for shortness of breath or wheezing. Notify if symptoms persist despite rescue inhaler/neb use.  Continue Stiolto 2 puffs daily Continue guaifenesin 1200 mg Twice daily for chest congestion Continue flutter valve 2-3 times a day for chest congestion   You did not drop your oxygen when we walked today, which was good.  Overnight oximetry study ordered to evaluate for drops in your oxygen overnight  Follow up with your heart doctor as scheduled to review cardiac monitor results  Follow up with Miranda Thompson or Miranda Jersey Karthikeya Funke,NP in 3 months. If symptoms do not improve or worsen, please contact office for sooner follow up or seek emergency care.

## 2022-01-02 NOTE — Assessment & Plan Note (Signed)
Unclear if this is progression of her COPD, cardiac etiology or physical deconditioning related to aging.  She will continue on Stiolto for now.  Would prefer to avoid ICS given her bronchiectasis.  CT scan was stable.  See above plan.

## 2022-01-02 NOTE — Progress Notes (Signed)
$'@Patient'c$  ID: Miranda Thompson, female    DOB: 09/13/1933, 88 y.o.   MRN: [id number]  Chief Complaint  Patient presents with   Follow-up    Pt f/u to discuss CT scan results.     Referring provider: [name], MD  HPI: 88 year old female, former smoker followed for COPD, bronchiectasis, and lung nodules. She is a patient of Dr. Juline Patch and last seen in office on 01/01/2021. Past medical history significant for atherosclerosis, GERD, OA, HLD.   TEST/EVENTS:  10/10/2021 echocardiogram: EF 66 5%.  Normal diastolic parameters.  RV size and function normal.  Normal PASP.  Trivial MR. 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  Small hiatal hernia.  Patulous esophagus.  No LAD.  Bilateral bronchiectasis, more pronounced in the lingula.  Scattered solid pulmonary nodules some of which demonstrate central cystic change.  Pulmonary findings are not significantly changed when compared to previous exam.  01/01/2021: OV with Dr. Valeta Harms. Here today for follow up after CT scan of the chest. CT completed in October 2022 with varicoid btx and a few cysts within the lungs. Other scattered small nodules. No significant change. Plan to repeat in 2-3 years. Breathing doesn't limit activity. Does use flutter valve occasionally. Not much sputum production. Doing well with stiolto.   01/02/2022: Today - follow up Patient presents today for follow-up to discuss CT scan results.  Her bronchiectasis and lung nodules were stable when compared to previous imaging.  No increased size or progression of disease noted.  She does have an enlarged right pulmonary artery; however, echocardiogram from August 2023 was without any right heart strain and with normal pulmonary artery pressures.  Today, she tells me that she has had some trouble with her breathing over the last 6 to 8 months.  Feels like she had a slow decline.  Gets winded with simple tasks  around the house such as changing the bed sheets.  She does also have some associated palpitations, feeling like her heart is fluttering, and dizziness.  She is being evaluated by cardiology for concern for A-fib.  She just completed wearing a heart monitor this week.  Has not received the results from this yet.  No increased cough or chest congestion.  She denies any wheezing, fevers, night sweats, mops this, weight gain or loss, lower extremity swelling, orthopnea, PND.  No episodes of syncope or chest pain.  She is currently on Stiolto.  Uses guaifenesin and flutter valve as needed for chest congestion.    No Known Allergies  Immunization History  Administered Date(s) Administered   Fluad Quad(high Dose 65+) 11/08/2019, 11/14/2020   Influenza Split 12/03/2011   Influenza Whole 11/30/2007, 11/30/2008, 12/03/2009, 11/24/2010   Influenza, High Dose Seasonal PF 11/19/2015, 12/02/2016, 12/17/2017, 11/22/2018   Influenza,inj,Quad PF,6+ Mos 11/10/2012, 11/08/2013, 11/06/2014   Influenza-Unspecified 12/02/2016, 11/22/2018   PFIZER Comirnaty(Gray Top)Covid-19 Tri-Sucrose Vaccine 05/27/2020   PFIZER(Purple Top)SARS-COV-2 Vaccination 03/14/2019, 04/04/2019, 11/18/2019   Pneumococcal Conjugate-13 05/09/2014   Pneumococcal Polysaccharide-23 11/07/2007   Tdap 05/09/2014   Zoster Recombinat (Shingrix) 04/10/2018, 01/08/2019   Zoster, Live 12/11/2009    Past Medical History:  Diagnosis Date   Adenomatous colon polyp    Allergic rhinitis    Anemia    pt states no anemia in her past hx thst she is aware of    Atrial fibrillation ([place])    goes in and out of this rhythm- takes Tenormin   Breast cancer, left  breast ([place]) 1994 with lumpectomy   chemo,tamox,radiation   Bronchiectasis with acute exacerbation (HCC)    COPD (chronic obstructive pulmonary disease) (HCC)    Diverticulosis of colon    DJD (degenerative joint disease)    Dysrhythmia    palpitations   Family history of brain cancer     Family history of colon cancer 02/14/2018   Family history of colon cancer    Family history of malignant neoplasm of gastrointestinal tract    Family history of melanoma    Family history of uterine cancer    Fibromyalgia    GERD (gastroesophageal reflux disease) 02/14/2018   Glaucoma    Hemoptysis    History of pneumonia    Hypercholesteremia    pt denies   Hypertension    Low back pain syndrome    Neuromuscular disorder (HCC)    hx fibromyalgia    Osteopenia    Pneumonia    hx   Pseudomonas infection    Rotator cuff syndrome of right shoulder    Spinal stenosis     Tobacco History: Social History   Tobacco Use  Smoking Status Former   Packs/day: 0.25   Years: 40.00   Total pack years: 10.00   Types: Cigarettes   Quit date: 02/24/1967   Years since quitting: 54.8  Smokeless Tobacco Never   Counseling given: Not Answered   Outpatient Medications Prior to Visit  Medication Sig Dispense Refill   albuterol (VENTOLIN HFA) 108 (90 Base) MCG/ACT inhaler Inhale 2 puffs into the lungs every 6 (six) hours as needed for wheezing or shortness of breath. 8 g 6   amoxicillin (AMOXIL) 500 MG capsule Take prior to dental procedures     ASPIRIN 81 PO Take 1 tablet by mouth daily.     Calcium Carb-Cholecalciferol 600-800 MG-UNIT TABS Take 1 tablet by mouth daily.     diltiazem (CARDIZEM CD) 120 MG 24 hr capsule TAKE 1 CAPSULE BY MOUTH EVERY DAY 90 capsule 3   guaiFENesin (MUCINEX) 600 MG 12 hr tablet Take by mouth 2 (two) times daily.     losartan (COZAAR) 25 MG tablet Take 0.5 tablets (12.5 mg total) by mouth daily. 45 tablet 2   LUMIGAN 0.01 % SOLN      Multiple Vitamins-Minerals (MULTIVITAMIN & MINERAL PO) Take 1 tablet by mouth daily.     nitroGLYCERIN (NITROSTAT) 0.4 MG SL tablet Place 1 tablet (0.4 mg total) under the tongue every 5 (five) minutes as needed for chest pain. 25 tablet 3   Nutritional Supplements (VITAMIN D BOOSTER PO) Take by mouth.     Respiratory Therapy  Supplies (FLUTTER) DEVI 1 application by Does not apply route 3 (three) times daily. 1 each 0   rosuvastatin (CRESTOR) 5 MG tablet Take 1 tablet (5 mg total) by mouth daily. 90 tablet 3   Saccharomyces boulardii (FLORASTOR PO) Take 1 capsule by mouth daily.     SODIUM FLUORIDE 5000 SENSITIVE 1.1-5 % GEL Take by mouth as directed.     Tiotropium Bromide-Olodaterol (STIOLTO RESPIMAT) 2.5-2.5 MCG/ACT AERS Inhale 2 puffs into the lungs daily. 4 g 7   Zinc 30 MG CAPS      metoprolol tartrate (LOPRESSOR) 100 MG tablet Take 1 tablet (100 mg total) by mouth once for 1 dose. Take 1 tablet (100 mg total) two hours prior to CT scan. 1 tablet 0   No facility-administered medications prior to visit.     Review of Systems:   Constitutional: No weight loss or  gain, night sweats, fevers, chills, or lassitude. +fatigue HEENT: No headaches, difficulty swallowing, tooth/dental problems, or sore throat. No sneezing, itching, ear ache, nasal congestion, or post nasal drip CV: +dizziness, palpitations. No chest pain, orthopnea, PND, swelling in lower extremities, anasarca, syncope Resp: +shortness of breath with exertion; occasional chronic cough. No excess mucus or change in color of mucus. No hemoptysis. No wheezing.  No chest wall deformity GI:  No heartburn, indigestion, abdominal pain, nausea, vomiting, diarrhea, change in bowel habits, loss of appetite, bloody stools.  Skin: No rash, lesions, ulcerations MSK:  No joint pain or swelling.  No decreased range of motion.  No back pain. Neuro: No dizziness or lightheadedness.  Psych: No depression or anxiety. Mood stable.     Physical Exam:  BP 118/64   Pulse 83   Ht '5\' 3"'$  (1.6 m)   Wt 132 lb (59.9 kg)   LMP 02/23/1985   SpO2 96%   BMI 23.38 kg/m   GEN: Pleasant, interactive, well-appearing; in no acute distress. HEENT:  Normocephalic and atraumatic. PERRLA. Sclera white. Nasal turbinates pink, moist and patent bilaterally. No rhinorrhea present.  Oropharynx pink and moist, without exudate or edema. No lesions, ulcerations, or postnasal drip.  NECK:  Supple w/ fair ROM. No JVD present. Normal carotid impulses w/o bruits. Thyroid symmetrical with no goiter or nodules palpated. No lymphadenopathy.   CV: RRR, no m/r/g, no peripheral edema. Pulses intact, +2 bilaterally. No cyanosis, pallor or clubbing. PULMONARY:  Unlabored, regular breathing. Clear bilaterally A&P w/o wheezes/rales/rhonchi. No accessory muscle use.  GI: BS present and normoactive. Soft, non-tender to palpation. No organomegaly or masses detected.  MSK: No erythema, warmth or tenderness. Cap refil <2 sec all extrem. No deformities or joint swelling noted.  Neuro: A/Ox3. No focal deficits noted.   Skin: Warm, no lesions or rashe Psych: Normal affect and behavior. Judgement and thought content appropriate.     Lab Results:  CBC    Component Value Date/Time   WBC 6.4 10/02/2021 1547   RBC 3.77 (L) 10/02/2021 1547   HGB 12.6 10/02/2021 1547   HGB 12.3 04/10/2021 1538   HCT 36.9 10/02/2021 1547   HCT 35.8 04/10/2021 1538   PLT 246.0 10/02/2021 1547   PLT 252 04/10/2021 1538   MCV 98.1 10/02/2021 1547   MCV 96 04/10/2021 1538   MCH 32.9 04/10/2021 1538   MCH 32.3 10/11/2016 1131   MCHC 34.1 10/02/2021 1547   RDW 12.3 10/02/2021 1547   RDW 11.4 (L) 04/10/2021 1538   LYMPHSABS 1.1 10/02/2021 1547   MONOABS 0.6 10/02/2021 1547   EOSABS 0.2 10/02/2021 1547   BASOSABS 0.1 10/02/2021 1547    BMET    Component Value Date/Time   NA 131 (L) 10/02/2021 1547   NA 132 (L) 10/01/2021 1127   K 4.3 10/02/2021 1547   CL 95 (L) 10/02/2021 1547   CO2 30 10/02/2021 1547   GLUCOSE 93 10/02/2021 1547   BUN 11 10/02/2021 1547   BUN 13 10/01/2021 1127   CREATININE 0.59 10/02/2021 1547   CALCIUM 9.3 10/02/2021 1547   GFRNONAA >60 10/11/2016 1131   GFRAA >60 10/11/2016 1131    BNP No results found for: "BNP"   Imaging:  CT Chest Wo Contrast  Result Date:  12/11/2021 CLINICAL DATA:  Lung nodules EXAM: CT CHEST WITHOUT CONTRAST TECHNIQUE: Multidetector CT imaging of the chest was performed following the standard protocol without IV contrast. RADIATION DOSE REDUCTION: This exam was performed according to the departmental dose-optimization program  which includes automated exposure control, adjustment of the mA and/or kV according to patient size and/or use of iterative reconstruction technique. COMPARISON:  Chest CT dated December 06, 2020 FINDINGS: Cardiovascular: Normal heart size. No pericardial effusion. Moderate coronary artery calcifications. Normal caliber thoracic aorta with moderate calcified plaque. 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 Mediastinum/Nodes: Small hiatal hernia. Thyroid is unremarkable. Patulous esophagus. No pathologically enlarged lymph nodes seen in the chest. Lungs/Pleura: Central airways are patent. Bilateral bronchiectasis which is most pronounced in the lingula. Scattered solid pulmonary nodules, some of which demonstrate central cystic change, not significantly changed in size compared with prior exam. Reference cavitary nodule of the left upper lobe is unchanged in size measuring 14 x 13 mm on series 8, image 32. Reference solid nodule of the posterior left upper lobe measuring 10 x 6 mm on series 8, image 46. Reference solid pulmonary nodule measuring 4 mm on series 8, image 49. Upper Abdomen: No acute abnormality. Musculoskeletal: No chest wall mass or suspicious bone lesions identified. IMPRESSION: 1. Bilateral bronchiectasis and scattered bilateral solid pulmonary nodules, not significantly changed when compared with prior exam. 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 2. 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 3. Aortic Atherosclerosis (ICD10-I70.0).  Electronically Signed   By: Yetta Glassman M.D.   On: 12/11/2021 08:34          No data to display          No results found for: "NITRICOXIDE"      Assessment & Plan:   DOE (dyspnea on exertion) Progressive decline over the last 6 to 8 months.  CT scan did not show any progression of her bronchiectasis or increased size in her lung nodules.  She has associated fatigue, palpitations and dizziness.  Currently undergoing work-up with cardiology.  CBC and TSH from August were normal; no evidence of anemia or thyroid disease to explain her symptoms.  Echocardiogram from August was also normal.  Walking oximetry today was without desaturations on room air.  We did discuss that some of her symptoms could be consistent with underlying untreated sleep apnea.  She does not want to have a sleep study as she would never wear a CPAP.  Advised that we at least obtain overnight oximetry to ensure that she is not dropping her saturations at night as this could cause worsening symptoms during the day.  She was agreeable to this.  She would like to wait on any other testing until she receives her results from cardiology.  Not sure if she would want to repeat pulmonary function testing.  Patient Instructions  Continue Albuterol inhaler 2 puffs every 6 hours as needed for shortness of breath or wheezing. Notify if symptoms persist despite rescue inhaler/neb use.  Continue Stiolto 2 puffs daily Continue guaifenesin 1200 mg Twice daily for chest congestion Continue flutter valve 2-3 times a day for chest congestion   You did not drop your oxygen when we walked today, which was good.  Overnight oximetry study ordered to evaluate for drops in your oxygen overnight  Follow up with your heart doctor as scheduled to review cardiac monitor results  Follow up with Dr. Valeta Harms or Joellen Jersey Dawson Hollman,NP in 3 months. If symptoms do not improve or worsen, please contact office for sooner follow up or seek emergency  care.   COPD, moderate ([place]) Unclear if this is  progression of her COPD, cardiac etiology or physical deconditioning related to aging.  She will continue on Stiolto for now.  Would prefer to avoid ICS given her bronchiectasis.  CT scan was stable.  See above plan.  Bronchiectasis ([place]) Appears stable on imaging without any evidence of progression.  She does not have any significant cough or chest congestion right now.  Dyspnea seems to be related to other underlying etiology.  See above plan.  She will continue with mucociliary clearance therapies in interim.   I spent 41 minutes of dedicated to the care of this patient on the date of this encounter to include pre-visit review of records, face-to-face time with the patient discussing conditions above, post visit ordering of testing, clinical documentation with the electronic health record, making appropriate referrals as documented, and communicating necessary findings to members of the patients care team.  Clayton Bibles, NP 01/02/2022  Pt aware and understands NP's role.

## 2022-01-02 NOTE — Assessment & Plan Note (Signed)
Progressive decline over the last 6 to 8 months.  CT scan did not show any progression of her bronchiectasis or increased size in her lung nodules.  She has associated fatigue, palpitations and dizziness.  Currently undergoing work-up with cardiology.  CBC and TSH from August were normal; no evidence of anemia or thyroid disease to explain her symptoms.  Echocardiogram from August was also normal.  Walking oximetry today was without desaturations on room air.  We did discuss that some of her symptoms could be consistent with underlying untreated sleep apnea.  She does not want to have a sleep study as she would never wear a CPAP.  Advised that we at least obtain overnight oximetry to ensure that she is not dropping her saturations at night as this could cause worsening symptoms during the day.  She was agreeable to this.  She would like to wait on any other testing until she receives her results from cardiology.  Not sure if she would want to repeat pulmonary function testing.  Patient Instructions  Continue Albuterol inhaler 2 puffs every 6 hours as needed for shortness of breath or wheezing. Notify if symptoms persist despite rescue inhaler/neb use.  Continue Stiolto 2 puffs daily Continue guaifenesin 1200 mg Twice daily for chest congestion Continue flutter valve 2-3 times a day for chest congestion   You did not drop your oxygen when we walked today, which was good.  Overnight oximetry study ordered to evaluate for drops in your oxygen overnight  Follow up with your heart doctor as scheduled to review cardiac monitor results  Follow up with Dr. Valeta Harms or Joellen Jersey Nikai Quest,NP in 3 months. If symptoms do not improve or worsen, please contact office for sooner follow up or seek emergency care.

## 2022-01-02 NOTE — Assessment & Plan Note (Signed)
Appears stable on imaging without any evidence of progression.  She does not have any significant cough or chest congestion right now.  Dyspnea seems to be related to other underlying etiology.  See above plan.  She will continue with mucociliary clearance therapies in interim.

## 2022-01-08 DIAGNOSIS — E785 Hyperlipidemia, unspecified: Secondary | ICD-10-CM | POA: Diagnosis not present

## 2022-01-08 DIAGNOSIS — J449 Chronic obstructive pulmonary disease, unspecified: Secondary | ICD-10-CM | POA: Diagnosis not present

## 2022-01-08 DIAGNOSIS — R42 Dizziness and giddiness: Secondary | ICD-10-CM | POA: Diagnosis not present

## 2022-01-09 ENCOUNTER — Telehealth: Payer: Self-pay | Admitting: Nurse Practitioner

## 2022-01-19 NOTE — Telephone Encounter (Signed)
Called and spoke w/ pt she verbalized understanding. NFN att

## 2022-01-22 ENCOUNTER — Encounter: Payer: Self-pay | Admitting: Internal Medicine

## 2022-01-22 ENCOUNTER — Other Ambulatory Visit: Payer: Self-pay | Admitting: Pulmonary Disease

## 2022-01-29 ENCOUNTER — Telehealth: Payer: Self-pay

## 2022-01-29 NOTE — Telephone Encounter (Signed)
I spoke with the pt re; her monitor results.   She notes that since starting the Crestor 12/12/21 she has had "heavy legs" she thinks it may be the Crestor... she will hold it for now and send [place] a My Chart to let us know how she is doing... I will forward to Dr Harrington Challenger to let her know and see if we should still get labs on her 02/10/22 as planned.

## 2022-01-29 NOTE — Telephone Encounter (Signed)
Sent to her My Chart... will call to follow up with her and see if she has any questions.

## 2022-01-29 NOTE — Telephone Encounter (Signed)
-----   Message from [place], MD sent at 01/28/2022  9:46 AM EST ----- Monitor showed SR  with frequent PACs Some SVT   Longest about 15 min at 150 bpm   Sensed some    No atrial fibrillaton I would keep on same medicines   If has more dizziness/syncope call

## 2022-01-30 NOTE — Telephone Encounter (Signed)
Hold on labs then if holding Crestor   Lets see how she feels

## 2022-02-02 DIAGNOSIS — Z79899 Other long term (current) drug therapy: Secondary | ICD-10-CM

## 2022-02-02 DIAGNOSIS — G8929 Other chronic pain: Secondary | ICD-10-CM

## 2022-02-02 NOTE — Telephone Encounter (Signed)
Per her request... MY Chart message sent to the pt.

## 2022-02-10 ENCOUNTER — Other Ambulatory Visit: Payer: Medicare PPO

## 2022-02-20 NOTE — Telephone Encounter (Signed)
If still having achiness I would recomm check CK as well as ESR

## 2022-02-27 ENCOUNTER — Other Ambulatory Visit: Payer: Medicare PPO

## 2022-02-27 ENCOUNTER — Telehealth: Payer: Self-pay | Admitting: *Deleted

## 2022-02-27 ENCOUNTER — Other Ambulatory Visit: Payer: Self-pay | Admitting: *Deleted

## 2022-02-27 DIAGNOSIS — R109 Unspecified abdominal pain: Secondary | ICD-10-CM

## 2022-02-27 DIAGNOSIS — R350 Frequency of micturition: Secondary | ICD-10-CM

## 2022-02-27 DIAGNOSIS — R8290 Unspecified abnormal findings in urine: Secondary | ICD-10-CM

## 2022-02-27 MED ORDER — SULFAMETHOXAZOLE-TRIMETHOPRIM 800-160 MG PO TABS: 1.0000 | ORAL_TABLET | Freq: Two times a day (BID) | ORAL | 0 refills | Status: DC

## 2022-02-27 NOTE — Telephone Encounter (Signed)
Spoke with patient. Advised per Tiffany.  Lab appt scheduled for today at 1030, future order placed. Patient aware office will call with results and plan.   Routing to Tiffany to f/u once results received and reviewed.

## 2022-02-27 NOTE — Telephone Encounter (Signed)
I am ok with her coming in to leave UA since we have no openings and we will call with results and plan.

## 2022-02-27 NOTE — Telephone Encounter (Signed)
Call transferred from front office.  Patient reports urinary frequency, abdominal discomfort, fatigue, odor in urine, and left flank pain. Symptoms started on 02/26/22. Denies fever/chills, N/V or blood in urine. Patient is requesting OV. Reports Hx of UTI.   Per review of EPIC, Treated with cipro for UTI 07/15/21 -Pseudomonas aeruginosa.   Advised patient Dr. Talbert Nan is out of the office, will review with covering provider and return call with recommendations. Patient agreeable.   Tiffany -can you review and advise. Looks like patient has also been seen by you in the past.

## 2022-02-27 NOTE — Telephone Encounter (Signed)
Patient notified of results and recommendations. See result note dated 02/27/22.   Routing to provider for final review.  Will close encounter.

## 2022-03-02 LAB — URINALYSIS, COMPLETE W/RFL CULTURE
Bilirubin Urine: NEGATIVE
Glucose, UA: NEGATIVE
Hyaline Cast: NONE SEEN /LPF
Ketones, ur: NEGATIVE
Nitrites, Initial: NEGATIVE
Protein, ur: NEGATIVE
Specific Gravity, Urine: 1.01 (ref 1.001–1.035)
WBC, UA: >=60 /HPF — AB (ref 0–5)
pH: 7 (ref 5.0–8.0)

## 2022-03-02 LAB — CULTURE INDICATED

## 2022-03-02 LAB — URINE CULTURE
MICRO NUMBER:: 14393989
SPECIMEN QUALITY:: ADEQUATE

## 2022-03-04 ENCOUNTER — Ambulatory Visit: Payer: Medicare PPO | Attending: Internal Medicine

## 2022-03-04 DIAGNOSIS — Z79899 Other long term (current) drug therapy: Secondary | ICD-10-CM

## 2022-03-04 DIAGNOSIS — G8929 Other chronic pain: Secondary | ICD-10-CM | POA: Diagnosis not present

## 2022-03-04 DIAGNOSIS — M5440 Lumbago with sciatica, unspecified side: Secondary | ICD-10-CM | POA: Diagnosis not present

## 2022-03-04 LAB — SEDIMENTATION RATE: Sed Rate: 6 mm/hr (ref 0–40)

## 2022-03-04 LAB — CK: Total CK: 203 U/L — ABNORMAL HIGH (ref 26–161)

## 2022-03-10 ENCOUNTER — Encounter: Payer: Self-pay | Admitting: Internal Medicine

## 2022-03-10 DIAGNOSIS — R748 Abnormal levels of other serum enzymes: Secondary | ICD-10-CM

## 2022-03-10 DIAGNOSIS — R42 Dizziness and giddiness: Secondary | ICD-10-CM

## 2022-03-10 DIAGNOSIS — M542 Cervicalgia: Secondary | ICD-10-CM

## 2022-03-10 DIAGNOSIS — G8929 Other chronic pain: Secondary | ICD-10-CM

## 2022-03-10 DIAGNOSIS — Z79899 Other long term (current) drug therapy: Secondary | ICD-10-CM

## 2022-03-20 ENCOUNTER — Encounter: Payer: Self-pay | Admitting: Internal Medicine

## 2022-03-23 ENCOUNTER — Other Ambulatory Visit: Payer: Self-pay | Admitting: Internal Medicine

## 2022-03-23 DIAGNOSIS — R739 Hyperglycemia, unspecified: Secondary | ICD-10-CM

## 2022-03-23 DIAGNOSIS — E785 Hyperlipidemia, unspecified: Secondary | ICD-10-CM

## 2022-03-23 DIAGNOSIS — E538 Deficiency of other specified B group vitamins: Secondary | ICD-10-CM

## 2022-03-23 DIAGNOSIS — E559 Vitamin D deficiency, unspecified: Secondary | ICD-10-CM

## 2022-03-23 NOTE — Telephone Encounter (Signed)
Pt has Medicare../lmb 

## 2022-03-24 ENCOUNTER — Other Ambulatory Visit (INDEPENDENT_AMBULATORY_CARE_PROVIDER_SITE_OTHER): Payer: Medicare PPO

## 2022-03-24 DIAGNOSIS — E785 Hyperlipidemia, unspecified: Secondary | ICD-10-CM | POA: Diagnosis not present

## 2022-03-24 DIAGNOSIS — E559 Vitamin D deficiency, unspecified: Secondary | ICD-10-CM | POA: Diagnosis not present

## 2022-03-24 DIAGNOSIS — R739 Hyperglycemia, unspecified: Secondary | ICD-10-CM

## 2022-03-24 DIAGNOSIS — E538 Deficiency of other specified B group vitamins: Secondary | ICD-10-CM | POA: Diagnosis not present

## 2022-03-24 LAB — CBC WITH DIFFERENTIAL/PLATELET
Basophils Absolute: 0.1 10*3/uL (ref 0.0–0.1)
Basophils Relative: 1.7 % (ref 0.0–3.0)
Eosinophils Absolute: 0.3 10*3/uL (ref 0.0–0.7)
Eosinophils Relative: 6.1 % — ABNORMAL HIGH (ref 0.0–5.0)
HCT: 35.2 % — ABNORMAL LOW (ref 36.0–46.0)
Hemoglobin: 12.3 g/dL (ref 12.0–15.0)
Lymphocytes Relative: 21.3 % (ref 12.0–46.0)
Lymphs Abs: 1 10*3/uL (ref 0.7–4.0)
MCHC: 34.8 g/dL (ref 30.0–36.0)
MCV: 96.9 fl (ref 78.0–100.0)
Monocytes Absolute: 0.5 10*3/uL (ref 0.1–1.0)
Monocytes Relative: 11.2 % (ref 3.0–12.0)
Neutro Abs: 2.8 10*3/uL (ref 1.4–7.7)
Neutrophils Relative %: 59.7 % (ref 43.0–77.0)
Platelets: 251 10*3/uL (ref 150.0–400.0)
RBC: 3.63 Mil/uL — ABNORMAL LOW (ref 3.87–5.11)
RDW: 12.7 % (ref 11.5–15.5)
WBC: 4.7 10*3/uL (ref 4.0–10.5)

## 2022-03-24 LAB — URINALYSIS, ROUTINE W REFLEX MICROSCOPIC
Bilirubin Urine: NEGATIVE
Hgb urine dipstick: NEGATIVE
Ketones, ur: NEGATIVE
Nitrite: NEGATIVE
RBC / HPF: NONE SEEN (ref 0–?)
Specific Gravity, Urine: 1.015 (ref 1.000–1.030)
Total Protein, Urine: NEGATIVE
Urine Glucose: NEGATIVE
Urobilinogen, UA: 0.2 (ref 0.0–1.0)
pH: 7.5 (ref 5.0–8.0)

## 2022-03-24 LAB — HEPATIC FUNCTION PANEL
ALT: 16 U/L (ref 0–35)
AST: 25 U/L (ref 0–37)
Albumin: 4 g/dL (ref 3.5–5.2)
Alkaline Phosphatase: 62 U/L (ref 39–117)
Bilirubin, Direct: 0.2 mg/dL (ref 0.0–0.3)
Total Bilirubin: 0.7 mg/dL (ref 0.2–1.2)
Total Protein: 7.2 g/dL (ref 6.0–8.3)

## 2022-03-24 LAB — LIPID PANEL
Cholesterol: 162 mg/dL (ref 0–200)
HDL: 62.6 mg/dL (ref >39.00)
LDL Cholesterol: 87 mg/dL (ref 0–99)
NonHDL: 99.84
Total CHOL/HDL Ratio: 3
Triglycerides: 65 mg/dL (ref 0.0–149.0)
VLDL: 13 mg/dL (ref 0.0–40.0)

## 2022-03-24 LAB — VITAMIN B12: Vitamin B-12: 741 pg/mL (ref 211–911)

## 2022-03-24 LAB — VITAMIN D 25 HYDROXY (VIT D DEFICIENCY, FRACTURES): VITD: 48.81 ng/mL (ref 30.00–100.00)

## 2022-03-24 LAB — BASIC METABOLIC PANEL
BUN: 15 mg/dL (ref 6–23)
CO2: 29 mEq/L (ref 19–32)
Calcium: 9.2 mg/dL (ref 8.4–10.5)
Chloride: 96 mEq/L (ref 96–112)
Creatinine, Ser: 0.59 mg/dL (ref 0.40–1.20)
GFR: 81.36 mL/min (ref >60.00)
Glucose, Bld: 88 mg/dL (ref 70–99)
Potassium: 4.4 mEq/L (ref 3.5–5.1)
Sodium: 131 mEq/L — ABNORMAL LOW (ref 135–145)

## 2022-03-24 LAB — TSH: TSH: 3.24 u[IU]/mL (ref 0.35–5.50)

## 2022-03-24 LAB — MICROALBUMIN / CREATININE URINE RATIO
Creatinine,U: 27.4 mg/dL
Microalb Creat Ratio: 2.6 mg/g (ref 0.0–30.0)
Microalb, Ur: <0.7 mg/dL (ref 0.0–1.9)

## 2022-03-24 LAB — HEMOGLOBIN A1C: Hgb A1c MFr Bld: 5.8 % (ref 4.6–6.5)

## 2022-03-27 ENCOUNTER — Ambulatory Visit (INDEPENDENT_AMBULATORY_CARE_PROVIDER_SITE_OTHER): Payer: Medicare PPO

## 2022-03-27 VITALS — BP 122/60 | HR 82 | Temp 97.7°F | Ht 63.0 in | Wt 130.8 lb

## 2022-03-27 DIAGNOSIS — Z0000 Encounter for general adult medical examination without abnormal findings: Secondary | ICD-10-CM | POA: Diagnosis not present

## 2022-03-27 NOTE — Patient Instructions (Addendum)
Miranda Thompson , Thank you for taking time to come for your Medicare Wellness Visit. I appreciate your ongoing commitment to your health goals. Please review the following plan we discussed and let me know if I can assist you in the future.   These are the goals we discussed:  Goals      My goal for 2024 is to keep on moving.        This is a list of the screening recommended for you and due dates:  Health Maintenance  Topic Date Due   Colon Cancer Screening  08/08/2019   COVID-19 Vaccine (6 - 2023-24 season) 01/09/2022   Medicare Annual Wellness Visit  03/28/2023   DTaP/Tdap/Td vaccine (2 - Td or Tdap) 05/08/2024   Pneumonia Vaccine  Completed   Flu Shot  Completed   DEXA scan (bone density measurement)  Completed   Zoster (Shingles) Vaccine  Completed   HPV Vaccine  Aged Out    Advanced directives: Yes  Conditions/risks identified: Yes  Next appointment: Follow up in one year for your annual wellness visit.    Preventive Care 92 Years and Older, Female Preventive care refers to lifestyle choices and visits with your health care provider that can promote health and wellness. What does preventive care include? A yearly physical exam. This is also called an annual well check. Dental exams once or twice a year. Routine eye exams. Ask your health care provider how often you should have your eyes checked. Personal lifestyle choices, including: Daily care of your teeth and gums. Regular physical activity. Eating a healthy diet. Avoiding tobacco and drug use. Limiting alcohol use. Practicing safe sex. Taking low-dose aspirin every day. Taking vitamin and mineral supplements as recommended by your health care provider. What happens during an annual well check? The services and screenings done by your health care provider during your annual well check will depend on your age, overall health, lifestyle risk factors, and family history of disease. Counseling  Your health care  provider may ask you questions about your: Alcohol use. Tobacco use. Drug use. Emotional well-being. Home and relationship well-being. Sexual activity. Eating habits. History of falls. Memory and ability to understand (cognition). Work and work Statistician. Reproductive health. Screening  You may have the following tests or measurements: Height, weight, and BMI. Blood pressure. Lipid and cholesterol levels. These may be checked every 5 years, or more frequently if you are over 50 years old. Skin check. Lung cancer screening. You may have this screening every year starting at age 45 if you have a 30-pack-year history of smoking and currently smoke or have quit within the past 15 years. Fecal occult blood test (FOBT) of the stool. You may have this test every year starting at age 68. Flexible sigmoidoscopy or colonoscopy. You may have a sigmoidoscopy every 5 years or a colonoscopy every 10 years starting at age 93. Hepatitis C blood test. Hepatitis B blood test. Sexually transmitted disease (STD) testing. Diabetes screening. This is done by checking your blood sugar (glucose) after you have not eaten for a while (fasting). You may have this done every 1-3 years. Bone density scan. This is done to screen for osteoporosis. You may have this done starting at age 28. Mammogram. This may be done every 1-2 years. Talk to your health care provider about how often you should have regular mammograms. Talk with your health care provider about your test results, treatment options, and if necessary, the need for more tests. Vaccines  Your  health care provider may recommend certain vaccines, such as: Influenza vaccine. This is recommended every year. Tetanus, diphtheria, and acellular pertussis (Tdap, Td) vaccine. You may need a Td booster every 10 years. Zoster vaccine. You may need this after age 66. Pneumococcal 13-valent conjugate (PCV13) vaccine. One dose is recommended after age  77. Pneumococcal polysaccharide (PPSV23) vaccine. One dose is recommended after age 65. Talk to your health care provider about which screenings and vaccines you need and how often you need them. This information is not intended to replace advice given to you by your health care provider. Make sure you discuss any questions you have with your health care provider. Document Released: 03/08/2015 Document Revised: 10/30/2015 Document Reviewed: 12/11/2014 Elsevier Interactive Patient Education  2017 [place] Prevention in the Home Falls can cause injuries. They can happen to people of all ages. There are many things you can do to make your home safe and to help prevent falls. What can I do on the outside of my home? Regularly fix the edges of walkways and driveways and fix any cracks. Remove anything that might make you trip as you walk through a door, such as a raised step or threshold. Trim any bushes or trees on the path to your home. Use bright outdoor lighting. Clear any walking paths of anything that might make someone trip, such as rocks or tools. Regularly check to see if handrails are loose or broken. Make sure that both sides of any steps have handrails. Any raised decks and porches should have guardrails on the edges. Have any leaves, snow, or ice cleared regularly. Use sand or salt on walking paths during winter. Clean up any spills in your garage right away. This includes oil or grease spills. What can I do in the bathroom? Use night lights. Install grab bars by the toilet and in the tub and shower. Do not use towel bars as grab bars. Use non-skid mats or decals in the tub or shower. If you need to sit down in the shower, use a plastic, non-slip stool. Keep the floor dry. Clean up any water that spills on the floor as soon as it happens. Remove soap buildup in the tub or shower regularly. Attach bath mats securely with double-sided non-slip rug tape. Do not have throw  rugs and other things on the floor that can make you trip. What can I do in the bedroom? Use night lights. Make sure that you have a light by your bed that is easy to reach. Do not use any sheets or blankets that are too big for your bed. They should not hang down onto the floor. Have a firm chair that has side arms. You can use this for support while you get dressed. Do not have throw rugs and other things on the floor that can make you trip. What can I do in the kitchen? Clean up any spills right away. Avoid walking on wet floors. Keep items that you use a lot in easy-to-reach places. If you need to reach something above you, use a strong step stool that has a grab bar. Keep electrical cords out of the way. Do not use floor polish or wax that makes floors slippery. If you must use wax, use non-skid floor wax. Do not have throw rugs and other things on the floor that can make you trip. What can I do with my stairs? Do not leave any items on the stairs. Make sure that there are handrails  on both sides of the stairs and use them. Fix handrails that are broken or loose. Make sure that handrails are as long as the stairways. Check any carpeting to make sure that it is firmly attached to the stairs. Fix any carpet that is loose or worn. Avoid having throw rugs at the top or bottom of the stairs. If you do have throw rugs, attach them to the floor with carpet tape. Make sure that you have a light switch at the top of the stairs and the bottom of the stairs. If you do not have them, ask someone to add them for you. What else can I do to help prevent falls? Wear shoes that: Do not have high heels. Have rubber bottoms. Are comfortable and fit you well. Are closed at the toe. Do not wear sandals. If you use a stepladder: Make sure that it is fully opened. Do not climb a closed stepladder. Make sure that both sides of the stepladder are locked into place. Ask someone to hold it for you, if  possible. Clearly mark and make sure that you can see: Any grab bars or handrails. First and last steps. Where the edge of each step is. Use tools that help you move around (mobility aids) if they are needed. These include: Canes. Walkers. Scooters. Crutches. Turn on the lights when you go into a dark area. Replace any light bulbs as soon as they burn out. Set up your furniture so you have a clear path. Avoid moving your furniture around. If any of your floors are uneven, fix them. If there are any pets around you, be aware of where they are. Review your medicines with your doctor. Some medicines can make you feel dizzy. This can increase your chance of falling. Ask your doctor what other things that you can do to help prevent falls. This information is not intended to replace advice given to you by your health care provider. Make sure you discuss any questions you have with your health care provider. Document Released: 12/06/2008 Document Revised: 07/18/2015 Document Reviewed: 03/16/2014 Elsevier Interactive Patient Education  2017 [employer].

## 2022-03-27 NOTE — Progress Notes (Signed)
Subjective:   Miranda Thompson is a 89 y.o. female who presents for Medicare Annual (Subsequent) preventive examination.  Review of Systems     Cardiac Risk Factors include: advanced age (>54mn, >>61women);family history of premature cardiovascular disease;hypertension     Objective:    Today's Vitals   03/27/22 1419 03/27/22 1447  BP: 122/60   Pulse: 82   Temp: 97.7 F (36.5 C)   TempSrc: Temporal   SpO2: 96%   Weight: 130 lb 12.8 oz (59.3 kg)   Height: '5\' 3"'$  (1.6 m)   PainSc: 0-No pain 0-No pain   Body mass index is 23.17 kg/m.     03/27/2022    2:50 PM 03/26/2021    3:33 PM 02/19/2021    5:32 PM 04/16/2020   10:19 AM 03/21/2020    2:37 PM 10/27/2016    3:29 PM 10/11/2016   11:42 AM  Advanced Directives  Does Patient Have a Medical Advance Directive? Yes Yes Yes Yes Yes Yes Yes  Type of AParamedicof [place]Beecher FallsLiving will Living will;Healthcare Power of AFruitridge PocketLiving will Living will;Healthcare Power of Attorney Living will;Healthcare Power of [place]Indian TrailLiving will [place] will  Does patient want to make changes to medical advance directive?  No - Patient declined  No - Patient declined No - Patient declined    Copy of [place]in Chart? No - copy requested No - copy requested  No - copy requested No - copy requested      Current Medications (verified) Outpatient Encounter Medications as of 03/27/2022  Medication Sig   albuterol (VENTOLIN HFA) 108 (90 Base) MCG/ACT inhaler TAKE 2 PUFFS BY MOUTH EVERY 6 HOURS AS NEEDED FOR WHEEZE OR SHORTNESS OF BREATH   amoxicillin (AMOXIL) 500 MG capsule Take prior to dental procedures   ASPIRIN 81 PO Take 1 tablet by mouth daily.   Calcium Carb-Cholecalciferol 600-800 MG-UNIT TABS Take 1 tablet by mouth daily.   diltiazem (CARDIZEM CD) 120 MG 24 hr capsule TAKE 1 CAPSULE BY MOUTH EVERY DAY   guaiFENesin  (MUCINEX) 600 MG 12 hr tablet Take by mouth 2 (two) times daily.   losartan (COZAAR) 25 MG tablet Take 0.5 tablets (12.5 mg total) by mouth daily.   LUMIGAN 0.01 % SOLN    metoprolol tartrate (LOPRESSOR) 100 MG tablet Take 1 tablet (100 mg total) by mouth once for 1 dose. Take 1 tablet (100 mg total) two hours prior to CT scan.   Multiple Vitamins-Minerals (MULTIVITAMIN & MINERAL PO) Take 1 tablet by mouth daily.   nitroGLYCERIN (NITROSTAT) 0.4 MG SL tablet Place 1 tablet (0.4 mg total) under the tongue every 5 (five) minutes as needed for chest pain.   Nutritional Supplements (VITAMIN D BOOSTER PO) Take by mouth.   Respiratory Therapy Supplies (FLUTTER) DEVI 1 application by Does not apply route 3 (three) times daily.   rosuvastatin (CRESTOR) 5 MG tablet Take 1 tablet (5 mg total) by mouth daily.   Saccharomyces boulardii (FLORASTOR PO) Take 1 capsule by mouth daily.   SODIUM FLUORIDE 5000 SENSITIVE 1.1-5 % GEL Take by mouth as directed.   sulfamethoxazole-trimethoprim (BACTRIM DS) 800-160 MG tablet Take 1 tablet by mouth 2 (two) times daily.   Tiotropium Bromide-Olodaterol (STIOLTO RESPIMAT) 2.5-2.5 MCG/ACT AERS INHALE 2 PUFFS BY MOUTH INTO THE LUNGS DAILY   Zinc 30 MG CAPS    No facility-administered encounter medications on file as of 03/27/2022.  Allergies (verified) Patient has no known allergies.   History: Past Medical History:  Diagnosis Date   Adenomatous colon polyp    Allergic rhinitis    Anemia    pt states no anemia in her past hx thst she is aware of    Atrial fibrillation ([place])    goes in and out of this rhythm- takes Tenormin   Breast cancer, left breast ([place]) 1994 with lumpectomy   chemo,tamox,radiation   Bronchiectasis with acute exacerbation (HCC)    COPD (chronic obstructive pulmonary disease) (HCC)    Diverticulosis of colon    DJD (degenerative joint disease)    Dysrhythmia    palpitations   Family history of brain cancer    Family history of colon cancer  02/14/2018   Family history of colon cancer    Family history of malignant neoplasm of gastrointestinal tract    Family history of melanoma    Family history of uterine cancer    Fibromyalgia    GERD (gastroesophageal reflux disease) 02/14/2018   Glaucoma    Hemoptysis    History of pneumonia    Hypercholesteremia    pt denies   Hypertension    Low back pain syndrome    Neuromuscular disorder (HCC)    hx fibromyalgia    Osteopenia    Pneumonia    hx   Pseudomonas infection    Rotator cuff syndrome of right shoulder    Spinal stenosis    Past Surgical History:  Procedure Laterality Date   CATARACT EXTRACTION Right    COLONOSCOPY     left breast lumpectomy and LN dissection  06/1992   Dr. [name]   LUNG BIOPSY  1968   TSurg---Dr. [name]   LUNG BIOPSY  2012   dr [name]   POLYPECTOMY     REVERSE SHOULDER ARTHROPLASTY Right 08/02/2015   Procedure: RIGHT REVERSE TOTAL SHOULDER ARTHROPLASTY;  Surgeon: [name], MD;  Location: [facility];  Service: Orthopedics;  Laterality: Right;   REVERSE TOTAL SHOULDER ARTHROPLASTY Right 08/02/2015   TONSILLECTOMY     TOTAL KNEE ARTHROPLASTY Right 01/21/2015   Procedure: TOTAL RIGHT KNEE ARTHROPLASTY;  Surgeon: [name], MD;  Location: [facility];  Service: Orthopedics;  Laterality: Right;   TOTAL KNEE ARTHROPLASTY Left 03/23/2016   Procedure: LEFT TOTAL KNEE ARTHROPLASTY;  Surgeon: [name], MD;  Location: [facility];  Service: Orthopedics;  Laterality: Left;   Family History  Problem Relation Age of Onset   Heart attack Mother        Deceased age 22   Colon cancer Father 92       deceased age 30 from colon ca.   Hypertension Brother    Colon polyps Daughter    Uterine cancer Daughter 59   Brain cancer Maternal Uncle    Stroke Maternal Grandmother    Other Paternal Uncle 66       possible cancer   Melanoma Daughter 67   Melanoma Niece 34       d. 76   Esophageal cancer Neg Hx    Stomach cancer Neg Hx    Rectal cancer Neg Hx     Social History   Socioeconomic History   Marital status: Married    Spouse name: Not on file   Number of children: 5   Years of education: Not on file   Highest education level: Not on file  Occupational History   Occupation: Retired  Tobacco Use   Smoking status: Former    Packs/day: 0.25  Years: 40.00    Total pack years: 10.00    Types: Cigarettes    Quit date: 02/24/1967    Years since quitting: 55.1   Smokeless tobacco: Never  Vaping Use   Vaping Use: Never used  Substance and Sexual Activity   Alcohol use: Yes    Alcohol/week: 1.0 standard drink of alcohol    Types: 1 Standard drinks or equivalent per week    Comment: occasionally   Drug use: No   Sexual activity: Not Currently    Partners: Male    Comment: husband vasectomy  Other Topics Concern   Not on file  Social History Narrative   Not on file   Social Determinants of Health   Financial Resource Strain: Low Risk  (03/27/2022)   Overall Financial Resource Strain (CARDIA)    Difficulty of Paying Living Expenses: Not hard at all  Food Insecurity: No Food Insecurity (03/27/2022)   Hunger Vital Sign    Worried About Running Out of Food in the Last Year: Never true    [place] in the Last Year: Never true  Transportation Needs: No Transportation Needs (03/27/2022)   PRAPARE - Hydrologist (Medical): No    Lack of Transportation (Non-Medical): No  Physical Activity: Sufficiently Active (03/27/2022)   Exercise Vital Sign    Days of Exercise per Week: 5 days    Minutes of Exercise per Session: 30 min  Stress: No Stress Concern Present (03/27/2022)   [place]    Feeling of Stress : Not at all  Social Connections: [place] (03/27/2022)   Social Connection and Isolation Panel [NHANES]    Frequency of Communication with Friends and Family: More than three times a week    Frequency of Social Gatherings with  Friends and Family: Once a week    Attends Religious Services: 1 to 4 times per year    Active Member of [employer] or Organizations: Yes    Attends Archivist Meetings: 1 to 4 times per year    Marital Status: Married    Tobacco Counseling Counseling given: Not Answered   Clinical Intake:  Pre-visit preparation completed: Yes  Pain : No/denies pain Pain Score: 0-No pain     BMI - recorded: 23.17 Nutritional Status: BMI of 19-24  Normal Nutritional Risks: None Diabetes: No  How often do you need to have someone help you when you read instructions, pamphlets, or other written materials from your doctor or pharmacy?: 1 - Never What is the last grade level you completed in school?: Bachelor's Degree  Diabetic? no  Interpreter Needed?: No  Information entered by :: Lisette Abu, LPN.   Activities of Daily Living    03/27/2022    2:51 PM  In your present state of health, do you have any difficulty performing the following activities:  Hearing? 0  Vision? 0  Difficulty concentrating or making decisions? 0  Walking or climbing stairs? 0  Dressing or bathing? 0  Doing errands, shopping? 0  Preparing Food and eating ? N  Using the Toilet? N  In the past six months, have you accidently leaked urine? N  Comment wears pad for protection  Do you have problems with loss of bowel control? N  Managing your Medications? N  Managing your Finances? N  Housekeeping or managing your Housekeeping? N    Patient Care Team: [name], MD as PCP - General (Internal Medicine)  Josue Hector, MD as PCP - Cardiology (Cardiology) Warden Fillers, MD as Consulting Physician (Ophthalmology) Marygrace Drought, MD as Consulting Physician (Ophthalmology)  Indicate any recent Medical Services you may have received from other than [facility] providers in the past year (date may be approximate).     Assessment:   This is a routine wellness examination for Miranda Thompson.  Hearing/Vision  screen Hearing Screening - Comments:: Denies hearing difficulties   Vision Screening - Comments:: Wears rx glasses - up to date with routine eye exams with Marygrace Drought, MD.   Dietary issues and exercise activities discussed: Current Exercise Habits: Home exercise routine, Type of exercise: walking, Time (Minutes): 30, Frequency (Times/Week): 5, Weekly Exercise (Minutes/Week): 150, Intensity: Moderate, Exercise limited by: None identified   Goals Addressed             This Visit's Progress    My goal for 2024 is to keep on moving.        Depression Screen    03/27/2022    2:48 PM 10/02/2021    2:59 PM 03/26/2021    4:02 PM 03/26/2021    3:21 PM 03/21/2020    2:35 PM 03/14/2020    3:07 PM 03/14/2020    2:50 PM  PHQ 2/9 Scores  PHQ - 2 Score 0 0 0 0 0 0 0  PHQ- 9 Score  1         Fall Risk    03/27/2022    2:51 PM 10/02/2021    2:59 PM 03/26/2021    4:02 PM 03/26/2021    3:21 PM 03/21/2020    2:37 PM  [place] in the past year? 1 1 0 1 0  Number falls in past yr: 0 1 0 0 0  Injury with Fall? 0 0 0 1 0  Risk for fall due to : No Fall Risks History of fall(s);Other (Comment)   No Fall Risks  Risk for fall due to: Comment  dizziness     Follow up Falls prevention discussed Falls evaluation completed       [place]:  Any stairs in or around the home? Yes  If so, are there any without handrails? No  Home free of loose throw rugs in walkways, pet beds, electrical cords, etc? Yes  Adequate lighting in your home to reduce risk of falls? Yes   ASSISTIVE DEVICES UTILIZED TO PREVENT FALLS:  Life alert? No  Use of a cane, walker or w/c? No  Grab bars in the bathroom? Yes  Shower chair or bench in shower? No  Elevated toilet seat or a handicapped toilet? No   TIMED UP AND GO:  Was the test performed? Yes .  Length of time to ambulate 10 feet: 8 sec.   Gait steady and fast without use of assistive device  Cognitive Function:         03/27/2022    2:51 PM 03/26/2021    4:17 PM  6CIT Screen  What Year? 0 points 0 points  What month? 0 points 0 points  What time? 0 points 0 points  Count back from 20 0 points 0 points  Months in reverse 0 points 0 points  Repeat phrase 0 points 0 points  Total Score 0 points 0 points    Immunizations Immunization History  Administered Date(s) Administered   COVID-19, mRNA, vaccine(Comirnaty)12 years and older 11/14/2021   Fluad Quad(high Dose 65+) 11/08/2019, 11/14/2020   Influenza Split 12/03/2011  Influenza Whole 11/30/2007, 11/30/2008, 12/03/2009, 11/24/2010   Influenza, High Dose Seasonal PF 11/19/2015, 12/02/2016, 12/17/2017, 11/22/2018   Influenza,inj,Quad PF,6+ Mos 11/10/2012, 11/08/2013, 11/06/2014   Influenza-Unspecified 12/02/2016, 11/22/2018, 11/14/2021   PFIZER Comirnaty(Gray Top)Covid-19 Tri-Sucrose Vaccine 05/27/2020   PFIZER(Purple Top)SARS-COV-2 Vaccination 03/14/2019, 04/04/2019, 11/18/2019   Pneumococcal Conjugate-13 05/09/2014   Pneumococcal Polysaccharide-23 11/07/2007   Tdap 05/09/2014   Zoster Recombinat (Shingrix) 04/10/2018, 01/08/2019   Zoster, Live 12/11/2009    TDAP status: Up to date  Flu Vaccine status: Up to date  Pneumococcal vaccine status: Up to date  Covid-19 vaccine status: Completed vaccines  Qualifies for Shingles Vaccine? Yes   Zostavax completed Yes   Shingrix Completed?: Yes  Screening Tests Health Maintenance  Topic Date Due   COLONOSCOPY (Pts 45-33yr Insurance coverage will need to be confirmed)  08/08/2019   COVID-19 Vaccine (6 - 2023-24 season) 01/09/2022   Medicare Annual Wellness (AWV)  03/28/2023   DTaP/Tdap/Td (2 - Td or Tdap) 05/08/2024   Pneumonia Vaccine 89 Years old  Completed   INFLUENZA VACCINE  Completed   DEXA SCAN  Completed   Zoster Vaccines- Shingrix  Completed   HPV VACCINES  Aged Out    Health Maintenance  Health Maintenance Due  Topic Date Due   COLONOSCOPY (Pts 45-432yrInsurance  coverage will need to be confirmed)  08/08/2019   COVID-19 Vaccine (6 - 2023-24 season) 01/09/2022    Colorectal cancer screening: No longer required.   Mammogram status: Completed 10/31/2021. Repeat every year  Bone Density status: Completed 9/*08/2020. Results reflect: Bone density results: OSTEOPENIA. Repeat every 2 years.  Lung Cancer Screening: (Low Dose CT Chest recommended if Age 87-80ears, 30 pack-year currently smoking OR have quit w/in 15years.) does not qualify.   Lung Cancer Screening Referral: no  Additional Screening:  Hepatitis C Screening: does not qualify; Completed no  Vision Screening: Recommended annual ophthalmology exams for early detection of glaucoma and other disorders of the eye. Is the patient up to date with their annual eye exam?  Yes  Who is the provider or what is the name of the office in which the patient attends annual eye exams? [facility]. If pt is not established with a provider, would they like to be referred to a provider to establish care? No .   Dental Screening: Recommended annual dental exams for proper oral hygiene  Community Resource Referral / Chronic Care Management: CRR required this visit?  No   CCM required this visit?  No      Plan:     I have personally reviewed and noted the following in the patient's chart:   Medical and social history Use of alcohol, tobacco or illicit drugs  Current medications and supplements including opioid prescriptions. Patient is not currently taking opioid prescriptions. Functional ability and status Nutritional status Physical activity Advanced directives List of other physicians Hospitalizations, surgeries, and ER visits in previous 12 months Vitals Screenings to include cognitive, depression, and falls Referrals and appointments  In addition, I have reviewed and discussed with patient certain preventive protocols, quality metrics, and best practice recommendations. A written  personalized care plan for preventive services as well as general preventive health recommendations were provided to patient.     ShSheral FlowLPN   2/06/25/9765 Nurse Notes:  Normal cognitive status assessed by direct observation by this Nurse Health Advisor. No abnormalities found.

## 2022-04-02 ENCOUNTER — Ambulatory Visit (INDEPENDENT_AMBULATORY_CARE_PROVIDER_SITE_OTHER): Payer: Medicare PPO | Admitting: Nurse Practitioner

## 2022-04-02 ENCOUNTER — Encounter: Payer: Self-pay | Admitting: Nurse Practitioner

## 2022-04-02 ENCOUNTER — Other Ambulatory Visit: Payer: Self-pay | Admitting: Nurse Practitioner

## 2022-04-02 VITALS — BP 114/58 | HR 87 | Temp 97.8°F | Ht 63.0 in | Wt 129.8 lb

## 2022-04-02 DIAGNOSIS — J479 Bronchiectasis, uncomplicated: Secondary | ICD-10-CM | POA: Diagnosis not present

## 2022-04-02 DIAGNOSIS — R0609 Other forms of dyspnea: Secondary | ICD-10-CM

## 2022-04-02 DIAGNOSIS — J449 Chronic obstructive pulmonary disease, unspecified: Secondary | ICD-10-CM

## 2022-04-02 DIAGNOSIS — K120 Recurrent oral aphthae: Secondary | ICD-10-CM | POA: Diagnosis not present

## 2022-04-02 MED ORDER — NYSTATIN 100000 UNIT/ML MT SUSP: 5.0000 mL | Freq: Four times a day (QID) | OROMUCOSAL | 0 refills | Status: AC

## 2022-04-02 MED ORDER — NYSTATIN 100000 UNIT/ML MT SUSP: 5.0000 mL | Freq: Four times a day (QID) | OROMUCOSAL | 0 refills | Status: DC

## 2022-04-02 NOTE — Patient Instructions (Addendum)
Continue Albuterol inhaler 2 puffs every 6 hours as needed for shortness of breath or wheezing. Notify if symptoms persist despite rescue inhaler/neb use.  Continue Stiolto 2 puffs daily Continue guaifenesin 1200 mg Twice daily for chest congestion Continue flutter valve 2-3 times a day for chest congestion    Follow up with Dr. Valeta Harms or Katie Yusuke Beza,NP in 6 months. If symptoms do not improve or worsen, please contact office for sooner follow up or seek emergency care.

## 2022-04-02 NOTE — Progress Notes (Signed)
$'@Patient'T$  ID: Miranda Thompson, female    DOB: 1933-04-27, 89 y.o.   MRN: [id number]  Chief Complaint  Patient presents with   Follow-up    Pt has had complaints of fatigue since last visit.    Referring provider: [name], MD  HPI: 89 year old female, former smoker followed for COPD, bronchiectasis, and lung nodules. She is a patient of Dr. Juline Patch and last seen in office on 01/02/2022. Past medical history significant for atherosclerosis, GERD, OA, HLD.   TEST/EVENTS:  10/10/2021 echocardiogram: EF 66 5%.  Normal diastolic parameters.  RV size and function normal.  Normal PASP.  Trivial MR. 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  Small hiatal hernia.  Patulous esophagus.  No LAD.  Bilateral bronchiectasis, more pronounced in the lingula.  Scattered solid pulmonary nodules some of which demonstrate central cystic change.  Pulmonary findings are not significantly changed when compared to previous exam. 01/07/2022 ONO: 4 min <88%, SpO2 low 85% with average 93.4%  01/01/2021: OV with Dr. Valeta Harms. Here today for follow up after CT scan of the chest. CT completed in October 2022 with varicoid btx and a few cysts within the lungs. Other scattered small nodules. No significant change. Plan to repeat in 2-3 years. Breathing doesn't limit activity. Does use flutter valve occasionally. Not much sputum production. Doing well with stiolto.   01/02/2022: OV with Milanie Rosenfield NP for follow-up to discuss CT scan results.  Her bronchiectasis and lung nodules were stable when compared to previous imaging.  No increased size or progression of disease noted.  She does have an enlarged right pulmonary artery; however, echocardiogram from August 2023 was without any right heart strain and with normal pulmonary artery pressures.  Today, she tells me that she has had some trouble with her breathing over the last 6 to 8 months.  Feels like she had  a slow decline.  Gets winded with simple tasks around the house such as changing the bed sheets.  She does also have some associated palpitations, feeling like her heart is fluttering, and dizziness.  She is being evaluated by cardiology for concern for A-fib.  She just completed wearing a heart monitor this week.  Has not received the results from this yet.  No increased cough or chest congestion.  She denies any wheezing, fevers, night sweats, mops this, weight gain or loss, lower extremity swelling, orthopnea, PND.  No episodes of syncope or chest pain.  She is currently on Stiolto.  Uses guaifenesin and flutter valve as needed for chest congestion.    04/02/2022: Today - follow up Patient presents today for follow up with her husband. Her daughter is also on the phone during our visit. She tells me that she is feeling unchanged compared to when she was here last. She still has fatigue and gets more short winded with activities around the house, especially changing the bed. She does have some associated palpitations; workup with cardiology has been unremarkable. Occasionally has some associated palpitations. At this point, symptoms have been ongoing for the last year. She doesn't feel any worse today. No increased cough or chest congestion. Denies fevers, chills, hemoptysis, night sweats. Eating and drinking well. She doesn't use her rescue inhaler often. She is compliant with Stiolto and uses guaifenesin and flutter as needed.  She did start using a dental paste with fluoride in it and thinks she left it on for too long the other night.  She also didn't rinse it off like she normally does. She has a few sores in her mouth now, which are all very small and white/red. Denies any associated symptoms.   No Known Allergies  Immunization History  Administered Date(s) Administered   COVID-19, mRNA, vaccine(Comirnaty)12 years and older 11/14/2021   Fluad Quad(high Dose 65+) 11/08/2019, 11/14/2020, 11/19/2021    Influenza Split 12/03/2011   Influenza Whole 11/30/2007, 11/30/2008, 12/03/2009, 11/24/2010   Influenza, High Dose Seasonal PF 11/19/2015, 12/02/2016, 12/17/2017, 11/22/2018   Influenza,inj,Quad PF,6+ Mos 11/10/2012, 11/08/2013, 11/06/2014   Influenza-Unspecified 12/02/2016, 11/22/2018, 11/14/2021   PFIZER Comirnaty(Gray Top)Covid-19 Tri-Sucrose Vaccine 05/27/2020   PFIZER(Purple Top)SARS-COV-2 Vaccination 03/14/2019, 04/04/2019, 11/18/2019   Pneumococcal Conjugate-13 05/09/2014   Pneumococcal Polysaccharide-23 11/07/2007   Tdap 05/09/2014   Zoster Recombinat (Shingrix) 04/10/2018, 01/08/2019   Zoster, Live 12/11/2009    Past Medical History:  Diagnosis Date   Adenomatous colon polyp    Allergic rhinitis    Anemia    pt states no anemia in her past hx thst she is aware of    Atrial fibrillation ([place])    goes in and out of this rhythm- takes Tenormin   Breast cancer, left breast ([place]) 1994 with lumpectomy   chemo,tamox,radiation   Bronchiectasis with acute exacerbation ([place])    COPD (chronic obstructive pulmonary disease) ([place])    Diverticulosis of colon    DJD (degenerative joint disease)    Dysrhythmia    palpitations   Family history of brain cancer    Family history of colon cancer 02/14/2018   Family history of colon cancer    Family history of malignant neoplasm of gastrointestinal tract    Family history of melanoma    Family history of uterine cancer    Fibromyalgia    GERD (gastroesophageal reflux disease) 02/14/2018   Glaucoma    Hemoptysis    History of pneumonia    Hypercholesteremia    pt denies   Hypertension    Low back pain syndrome    Neuromuscular disorder (HCC)    hx fibromyalgia    Osteopenia    Pneumonia    hx   Pseudomonas infection    Rotator cuff syndrome of right shoulder    Spinal stenosis     Tobacco History: Social History   Tobacco Use  Smoking Status Former   Packs/day: 0.25   Years: 40.00   Total pack years: 10.00   Types:  Cigarettes   Quit date: 02/24/1967   Years since quitting: 55.1  Smokeless Tobacco Never   Counseling given: Not Answered   Outpatient Medications Prior to Visit  Medication Sig Dispense Refill   albuterol (VENTOLIN HFA) 108 (90 Base) MCG/ACT inhaler TAKE 2 PUFFS BY MOUTH EVERY 6 HOURS AS NEEDED FOR WHEEZE OR SHORTNESS OF BREATH 8.5 each 6   ASPIRIN 81 PO Take 1 tablet by mouth daily.     Calcium Carb-Cholecalciferol 600-800 MG-UNIT TABS Take 1 tablet by mouth daily.     diltiazem (CARDIZEM CD) 120 MG 24 hr capsule TAKE 1 CAPSULE BY MOUTH EVERY DAY 90 capsule 3   guaiFENesin (MUCINEX) 600 MG 12 hr tablet Take by mouth 2 (two) times daily.     losartan (COZAAR) 25 MG tablet Take 0.5 tablets (12.5 mg total) by mouth daily. 45 tablet 2   LUMIGAN 0.01 % SOLN      Multiple Vitamins-Minerals (MULTIVITAMIN & MINERAL PO) Take 1 tablet by mouth daily.     nitroGLYCERIN (NITROSTAT) 0.4 MG SL tablet Place 1 tablet (  0.4 mg total) under the tongue every 5 (five) minutes as needed for chest pain. 25 tablet 3   Nutritional Supplements (VITAMIN D BOOSTER PO) Take by mouth.     Respiratory Therapy Supplies (FLUTTER) DEVI 1 application by Does not apply route 3 (three) times daily. 1 each 0   Saccharomyces boulardii (FLORASTOR PO) Take 1 capsule by mouth daily.     SODIUM FLUORIDE 5000 SENSITIVE 1.1-5 % GEL Take by mouth as directed.     Tiotropium Bromide-Olodaterol (STIOLTO RESPIMAT) 2.5-2.5 MCG/ACT AERS INHALE 2 PUFFS BY MOUTH INTO THE LUNGS DAILY 4 g 7   Zinc 30 MG CAPS      amoxicillin (AMOXIL) 500 MG capsule Take prior to dental procedures (Patient not taking: Reported on 04/02/2022)     rosuvastatin (CRESTOR) 5 MG tablet Take 1 tablet (5 mg total) by mouth daily. (Patient not taking: Reported on 04/02/2022) 90 tablet 3   metoprolol tartrate (LOPRESSOR) 100 MG tablet Take 1 tablet (100 mg total) by mouth once for 1 dose. Take 1 tablet (100 mg total) two hours prior to CT scan. 1 tablet 0    sulfamethoxazole-trimethoprim (BACTRIM DS) 800-160 MG tablet Take 1 tablet by mouth 2 (two) times daily. 6 tablet 0   No facility-administered medications prior to visit.     Review of Systems:   Constitutional: No weight loss or gain, night sweats, fevers, chills, or lassitude. +fatigue HEENT: No headaches, difficulty swallowing, tooth/dental problems, or sore throat. No sneezing, itching, ear ache, nasal congestion, or post nasal drip CV: +dizziness, palpitations. No chest pain, orthopnea, PND, swelling in lower extremities, anasarca, syncope Resp: +shortness of breath with exertion; occasional chronic cough. No excess mucus or change in color of mucus. No hemoptysis. No wheezing.  No chest wall deformity GI:  No heartburn, indigestion, abdominal pain, nausea, vomiting, diarrhea, change in bowel habits, loss of appetite, bloody stools.  Skin: No rash, lesions, ulcerations MSK:  No joint pain or swelling.  No decreased range of motion.  No back pain. Neuro: No dizziness or lightheadedness.  Psych: No depression or anxiety. Mood stable.     Physical Exam:  BP (!) 114/58 (BP Location: Right Arm, Patient Position: Sitting, Cuff Size: Normal)   Pulse 87   Temp 97.8 F (36.6 C) (Oral)   Ht '5\' 3"'$  (1.6 m)   Wt 129 lb 12.8 oz (58.9 kg)   LMP 02/23/1985   SpO2 98% Comment: RA  BMI 22.99 kg/m   GEN: Pleasant, interactive, well-appearing; in no acute distress. HEENT:  Normocephalic and atraumatic. PERRLA. Sclera white. Nasal turbinates pink, moist and patent bilaterally. No rhinorrhea present. Oropharynx pink and moist, without exudate or edema. No lesions, ulcerations, or postnasal drip.  NECK:  Supple w/ fair ROM. No JVD present. Normal carotid impulses w/o bruits. Thyroid symmetrical with no goiter or nodules palpated. No lymphadenopathy.   CV: RRR, no m/r/g, no peripheral edema. Pulses intact, +2 bilaterally. No cyanosis, pallor or clubbing. PULMONARY:  Unlabored, regular breathing.  Clear bilaterally A&P w/o wheezes/rales/rhonchi. No accessory muscle use.  GI: BS present and normoactive. Soft, non-tender to palpation. No organomegaly or masses detected.  MSK: No erythema, warmth or tenderness. Cap refil <2 sec all extrem. No deformities or joint swelling noted.  Neuro: A/Ox3. No focal deficits noted.   Skin: Warm, no lesions or rashe Psych: Normal affect and behavior. Judgement and thought content appropriate.     Lab Results:  CBC    Component Value Date/Time   WBC 4.7 03/24/2022  1003   RBC 3.63 (L) 03/24/2022 1003   HGB 12.3 03/24/2022 1003   HGB 12.3 04/10/2021 1538   HCT 35.2 (L) 03/24/2022 1003   HCT 35.8 04/10/2021 1538   PLT 251.0 03/24/2022 1003   PLT 252 04/10/2021 1538   MCV 96.9 03/24/2022 1003   MCV 96 04/10/2021 1538   MCH 32.9 04/10/2021 1538   MCH 32.3 10/11/2016 1131   MCHC 34.8 03/24/2022 1003   RDW 12.7 03/24/2022 1003   RDW 11.4 (L) 04/10/2021 1538   LYMPHSABS 1.0 03/24/2022 1003   MONOABS 0.5 03/24/2022 1003   EOSABS 0.3 03/24/2022 1003   BASOSABS 0.1 03/24/2022 1003    BMET    Component Value Date/Time   NA 131 (L) 03/24/2022 1003   NA 132 (L) 10/01/2021 1127   K 4.4 03/24/2022 1003   CL 96 03/24/2022 1003   CO2 29 03/24/2022 1003   GLUCOSE 88 03/24/2022 1003   BUN 15 03/24/2022 1003   BUN 13 10/01/2021 1127   CREATININE 0.59 03/24/2022 1003   CALCIUM 9.2 03/24/2022 1003   GFRNONAA >60 10/11/2016 1131   GFRAA >60 10/11/2016 1131    BNP No results found for: "BNP"   Imaging:  No results found.        No data to display          No results found for: "NITRICOXIDE"      Assessment & Plan:   DOE (dyspnea on exertion) Progressive decline over the last year.  Previous CT scan did not show any progression of her bronchiectasis or increased size in her lung nodules.  She has associated fatigue, palpitations and dizziness.  Workup from PCP and cardiology has been unremarkable this far. No evidence of  anemia, thyroid disease, or cardiac etiologies. Walking oximetry at previous visit was without desaturations on room air. She also had ONO without significant desaturations. Low suspicion for underlying OSA; however, we did discuss that persistent daytime fatigue symptoms can be associated with this. Reviewed risks of untreated OSA. She again declined to have a sleep study as she would never wear a CPAP.  Offered repeat PFTs to evaluate the status of her lung function and diffusing capacity; however, she prefers not to do this either. We did discuss trial of prednisone to see if she received any benefit, which she also declined. She would prefer not to add any medications on at this time. Shared decision was made to move forward with monitoring symptoms and hold further workup at this time.   Patient Instructions  Continue Albuterol inhaler 2 puffs every 6 hours as needed for shortness of breath or wheezing. Notify if symptoms persist despite rescue inhaler/neb use.  Continue Stiolto 2 puffs daily Continue guaifenesin 1200 mg Twice daily for chest congestion Continue flutter valve 2-3 times a day for chest congestion    Follow up with Dr. Valeta Harms or Katie Mariella Blackwelder,NP in 6 months. If symptoms do not improve or worsen, please contact office for sooner follow up or seek emergency care.   Bronchiectasis ([place]) Stable. She does not have any acute infectious symptoms. She will continue with mucociliary clearance therapies as needed.   COPD, moderate ([place]) Moderate COPD; maintained on Stiolto. See above plan. Would not add ICS at this time, given her btx. Encouraged her to remain active, as tolerated.   Aphthous stomatitis Will send for magic mouthwash. Advised her to avoid using the dental paste moving forward, if possible, or ensure she cleans her mouth well afterwards. Avoid acidic  foods until symptoms resolve. She will notify her PCP or dentist if symptoms persist.     I spent 35 minutes of dedicated to  the care of this patient on the date of this encounter to include pre-visit review of records, face-to-face time with the patient discussing conditions above, post visit ordering of testing, clinical documentation with the electronic health record, making appropriate referrals as documented, and communicating necessary findings to members of the patients care team.  Clayton Bibles, NP 04/02/2022  Pt aware and understands NP's role.

## 2022-04-02 NOTE — Assessment & Plan Note (Signed)
Stable. She does not have any acute infectious symptoms. She will continue with mucociliary clearance therapies as needed.

## 2022-04-02 NOTE — Assessment & Plan Note (Signed)
Progressive decline over the last year.  Previous CT scan did not show any progression of her bronchiectasis or increased size in her lung nodules.  She has associated fatigue, palpitations and dizziness.  Workup from PCP and cardiology has been unremarkable this far. No evidence of anemia, thyroid disease, or cardiac etiologies. Walking oximetry at previous visit was without desaturations on room air. She also had ONO without significant desaturations. Low suspicion for underlying OSA; however, we did discuss that persistent daytime fatigue symptoms can be associated with this. Reviewed risks of untreated OSA. She again declined to have a sleep study as she would never wear a CPAP.  Offered repeat PFTs to evaluate the status of her lung function and diffusing capacity; however, she prefers not to do this either. We did discuss trial of prednisone to see if she received any benefit, which she also declined. She would prefer not to add any medications on at this time. Shared decision was made to move forward with monitoring symptoms and hold further workup at this time.   Patient Instructions  Continue Albuterol inhaler 2 puffs every 6 hours as needed for shortness of breath or wheezing. Notify if symptoms persist despite rescue inhaler/neb use.  Continue Stiolto 2 puffs daily Continue guaifenesin 1200 mg Twice daily for chest congestion Continue flutter valve 2-3 times a day for chest congestion    Follow up with Dr. Valeta Harms or Katie Karynn Deblasi,NP in 6 months. If symptoms do not improve or worsen, please contact office for sooner follow up or seek emergency care.

## 2022-04-02 NOTE — Assessment & Plan Note (Signed)
Moderate COPD; maintained on Stiolto. See above plan. Would not add ICS at this time, given her btx. Encouraged her to remain active, as tolerated.

## 2022-04-02 NOTE — Assessment & Plan Note (Signed)
Will send for magic mouthwash. Advised her to avoid using the dental paste moving forward, if possible, or ensure she cleans her mouth well afterwards. Avoid acidic foods until symptoms resolve. She will notify her PCP or dentist if symptoms persist.

## 2022-04-03 ENCOUNTER — Encounter: Payer: Self-pay | Admitting: Internal Medicine

## 2022-05-13 ENCOUNTER — Ambulatory Visit: Payer: Medicare PPO | Attending: Internal Medicine

## 2022-05-13 DIAGNOSIS — Z79899 Other long term (current) drug therapy: Secondary | ICD-10-CM

## 2022-05-13 DIAGNOSIS — R748 Abnormal levels of other serum enzymes: Secondary | ICD-10-CM | POA: Diagnosis not present

## 2022-05-14 LAB — CK: Total CK: 135 U/L (ref 26–161)

## 2022-06-14 ENCOUNTER — Encounter: Payer: Self-pay | Admitting: Internal Medicine

## 2022-08-07 DIAGNOSIS — H5212 Myopia, left eye: Secondary | ICD-10-CM | POA: Diagnosis not present

## 2022-08-07 DIAGNOSIS — H35372 Puckering of macula, left eye: Secondary | ICD-10-CM | POA: Diagnosis not present

## 2022-08-07 DIAGNOSIS — H25012 Cortical age-related cataract, left eye: Secondary | ICD-10-CM | POA: Diagnosis not present

## 2022-08-07 DIAGNOSIS — H52203 Unspecified astigmatism, bilateral: Secondary | ICD-10-CM | POA: Diagnosis not present

## 2022-08-07 DIAGNOSIS — H2512 Age-related nuclear cataract, left eye: Secondary | ICD-10-CM | POA: Diagnosis not present

## 2022-08-07 DIAGNOSIS — H04123 Dry eye syndrome of bilateral lacrimal glands: Secondary | ICD-10-CM | POA: Diagnosis not present

## 2022-08-07 DIAGNOSIS — H25042 Posterior subcapsular polar age-related cataract, left eye: Secondary | ICD-10-CM | POA: Diagnosis not present

## 2022-08-07 DIAGNOSIS — H524 Presbyopia: Secondary | ICD-10-CM | POA: Diagnosis not present

## 2022-08-07 DIAGNOSIS — H401122 Primary open-angle glaucoma, left eye, moderate stage: Secondary | ICD-10-CM | POA: Diagnosis not present

## 2022-09-18 ENCOUNTER — Other Ambulatory Visit: Payer: Self-pay | Admitting: Internal Medicine

## 2022-09-18 ENCOUNTER — Ambulatory Visit: Payer: Medicare PPO | Admitting: Radiology

## 2022-09-18 VITALS — BP 164/72 | Temp 97.8°F

## 2022-09-18 DIAGNOSIS — R350 Frequency of micturition: Secondary | ICD-10-CM | POA: Diagnosis not present

## 2022-09-18 DIAGNOSIS — R030 Elevated blood-pressure reading, without diagnosis of hypertension: Secondary | ICD-10-CM

## 2022-09-18 LAB — URINALYSIS, COMPLETE W/RFL CULTURE
Bilirubin Urine: NEGATIVE
Glucose, UA: NEGATIVE
Hyaline Cast: NONE SEEN /LPF
Ketones, ur: NEGATIVE
Nitrites, Initial: NEGATIVE
Protein, ur: NEGATIVE
Specific Gravity, Urine: 1.015 (ref 1.001–1.035)
pH: 7.5 (ref 5.0–8.0)

## 2022-09-18 LAB — CULTURE INDICATED

## 2022-09-18 MED ORDER — NITROFURANTOIN MONOHYD MACRO 100 MG PO CAPS: 100.0000 mg | ORAL_CAPSULE | Freq: Two times a day (BID) | ORAL | 0 refills | Status: DC

## 2022-09-18 NOTE — Progress Notes (Signed)
SUBJECTIVE: Miranda Thompson is a 89 y.o. female who complains of lower right back pain x's 1 week, urinary frequency, urgency, vaginal pressure, itching, abdominal distension, chills.   No Known Allergies  Current Outpatient Medications on File Prior to Visit  Medication Sig Dispense Refill   albuterol (VENTOLIN HFA) 108 (90 Base) MCG/ACT inhaler TAKE 2 PUFFS BY MOUTH EVERY 6 HOURS AS NEEDED FOR WHEEZE OR SHORTNESS OF BREATH 8.5 each 6   ASPIRIN 81 PO Take 1 tablet by mouth daily.     Calcium Carb-Cholecalciferol 600-800 MG-UNIT TABS Take 1 tablet by mouth daily.     diltiazem (CARDIZEM CD) 120 MG 24 hr capsule TAKE 1 CAPSULE BY MOUTH EVERY DAY 90 capsule 3   guaiFENesin (MUCINEX) 600 MG 12 hr tablet Take by mouth 2 (two) times daily.     losartan (COZAAR) 25 MG tablet Take 0.5 tablets (12.5 mg total) by mouth daily. 45 tablet 2   LUMIGAN 0.01 % SOLN      Multiple Vitamins-Minerals (MULTIVITAMIN & MINERAL PO) Take 1 tablet by mouth daily.     nitroGLYCERIN (NITROSTAT) 0.4 MG SL tablet Place 1 tablet (0.4 mg total) under the tongue every 5 (five) minutes as needed for chest pain. 25 tablet 3   Nutritional Supplements (VITAMIN D BOOSTER PO) Take by mouth.     Respiratory Therapy Supplies (FLUTTER) DEVI 1 application by Does not apply route 3 (three) times daily. 1 each 0   Saccharomyces boulardii (FLORASTOR PO) Take 1 capsule by mouth daily.     SODIUM FLUORIDE 5000 SENSITIVE 1.1-5 % GEL Take by mouth as directed.     Tiotropium Bromide-Olodaterol (STIOLTO RESPIMAT) 2.5-2.5 MCG/ACT AERS INHALE 2 PUFFS BY MOUTH INTO THE LUNGS DAILY 4 g 7   Zinc 30 MG CAPS      No current facility-administered medications on file prior to visit.    Past Medical History:  Diagnosis Date   Adenomatous colon polyp    Allergic rhinitis    Anemia    pt states no anemia in her past hx thst she is aware of    Atrial fibrillation ([facility])    goes in and out of this rhythm- takes Tenormin   Breast cancer,  left breast ([facility]) 1994 with lumpectomy   chemo,tamox,radiation   Bronchiectasis with acute exacerbation ([facility])    COPD (chronic obstructive pulmonary disease) ([facility])    Diverticulosis of colon    DJD (degenerative joint disease)    Dysrhythmia    palpitations   Family history of brain cancer    Family history of colon cancer 02/14/2018   Family history of colon cancer    Family history of malignant neoplasm of gastrointestinal tract    Family history of melanoma    Family history of uterine cancer    Fibromyalgia    GERD (gastroesophageal reflux disease) 02/14/2018   Glaucoma    Hemoptysis    History of pneumonia    Hypercholesteremia    pt denies   Hypertension    Low back pain syndrome    Neuromuscular disorder ([facility])    hx fibromyalgia    Osteopenia    Pneumonia    hx   Pseudomonas infection    Rotator cuff syndrome of right shoulder    Spinal stenosis      OBJECTIVE: Appears well, in no apparent distress.  Vital signs are normal. The abdomen is soft without tenderness, guarding, mass, rebound or organomegaly. No CVA tenderness or inguinal adenopathy  noted.   Urine dipstick shows positive for RBC's and positive for leukocytes.  Micro exam: 40-60 WBC's per HPF, 0-2 RBC's per HPF, and many+ bacteria.    Blood pressure (!) 164/72, temperature 97.8 F (36.6 C), temperature source Oral, last menstrual period 02/23/1985.    Chaperone offered and declined for exam.  ASSESSMENT/PLAN: 1. Frequency of urination - Urinalysis,Complete w/RFL Culture - nitrofurantoin, macrocrystal-monohydrate, (MACROBID) 100 MG capsule; Take 1 capsule (100 mg total) by mouth 2 (two) times daily.  Dispense: 14 capsule; Refill: 0  - Elevated BP, has not taken meds yet today  Will send urine culture and sensitivity.  Treatment per orders - also push fluids, avoid bladder irritants. Instructed she may use Pyridium OTC prn. Call or return to clinic prn if these symptoms worsen or fail to improve as  anticipated. Pyelo precautions reviewed with patient.   Arlie Solomons B WHNP-BC 10:22 AM 09/18/2022

## 2022-09-28 NOTE — Telephone Encounter (Signed)
Unsure if symptoms related to antibiotics. Has completed prescription, so if related these symptoms should be better. H/O a fib, so I do recommend she be evaluated by PCP or cardiology ASAP. Of course if she experiences shortness of breath or chest pain she should go to ER. May return for OV if she still feels she is having UTI symptoms.

## 2022-10-09 ENCOUNTER — Encounter: Payer: Self-pay | Admitting: Internal Medicine

## 2022-10-09 MED ORDER — NITROGLYCERIN 0.4 MG SL SUBL: 0.4000 mg | SUBLINGUAL_TABLET | SUBLINGUAL | 0 refills | Status: DC | PRN

## 2022-10-11 NOTE — Progress Notes (Unsigned)
Synopsis: Referred in July 2020 for est care from Dr. Kriste Basque, PCP: Corwin Levins, MD  Subjective:   PATIENT ID: Miranda Thompson GENDER: female DOB: 09-25-1933, MRN: [id number]  No chief complaint on file.   Former patient of Dr. Kriste Basque.  Followed for COPD and bronchiectasis.  Here today to establish care.  She is currently managed with a flutter valve and individual postural drainage at home.  She exercises regularly.  She rides a stationary Peloton bicycle 6 times per week.  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  She believes that her first lung injury occurred when she was washing her husband's clothes many years ago in the basement when she decided to mix Clorox bleach and ammonium.  She was a horrible gas fumes that came out of the basement washing machine that drove her out of the home.  She states her flutter valve works some of the times but does not work as well as postural drainage.  As she is getting older she has become weaker and unable to do these at home.  She currently lives alone is unable to do CPT or have someone to help her with CPT.  She has not had axial CT imaging of the chest since 2004.  She did have breast cancer in the early 90s status post radiation to the left side following a lumpectomy.  She has daily cough and sputum production.  No recent fevers.  OV 03/22/2019: Patient seen today for follow-up in the office for her COPD and bronchiectasis.  Currently symptoms are at baseline.  She does have daily sputum production.  This is better with postural drainage daily.  Currently using her inhaler regimen and.  She is on a ICS/LABA.  Patient has several questions today.  Including Covid vaccine questions.  All of these were addressed during the office visit.  Patient has received her first Covid vaccine.  OV 12/25/2019: Here today for follow-up after recent noncontrasted CT scan of the chest.  It does reveal bilateral areas of  diffuse nodularity and persistent verrucoid and cystic bronchiectasis.  No other significant changes.  She does notice there are readings that report coronary disease.  She has questions about this today.  We reviewed her CT scan imaging in detail with patient and patient's daughter present at bedside.  She also complains today of heaviness.  And her aches lower extremities.  This predominantly occurs with exertion or with walking.  She notices a heavy sensation in her lower extremities this is new over the past couple of months and has been insidious with onset.  OV 01/01/2021: Here today for follow-up after recent CT scan of the chest.  CT was completed a few weeks ago in October which revealed evidence of varicoid bronchiectasis and a few cysts within the lung.  Other scattered small nodules.  Her bronchiectasis is overall stable.  No significant change from previous CT imaging.  From respiratory standpoint she is able to complete all of her activities of daily living.  She does use flutter valve occasionally.  She routinely uses exercise for airway clearance methodologies.  Does not have that significant amount of sputum production.  Also doing well with her Stiolto daily.  OV 10/12/2022: ***     Past Medical History:  Diagnosis Date   Adenomatous colon polyp    Allergic rhinitis    Anemia    pt states no anemia in her past hx thst  she is aware of    Atrial fibrillation [facility])    goes in and out of this rhythm- takes Tenormin   Breast cancer, left breast (HCC) 1994 with lumpectomy   chemo,tamox,radiation   Bronchiectasis with acute exacerbation (HCC)    COPD (chronic obstructive pulmonary disease) (HCC)    Diverticulosis of colon    DJD (degenerative joint disease)    Dysrhythmia    palpitations   Family history of brain cancer    Family history of colon cancer 02/14/2018   Family history of colon cancer    Family history of malignant neoplasm of gastrointestinal tract    Family  history of melanoma    Family history of uterine cancer    Fibromyalgia    GERD (gastroesophageal reflux disease) 02/14/2018   Glaucoma    Hemoptysis    History of pneumonia    Hypercholesteremia    pt denies   Hypertension    Low back pain syndrome    Neuromuscular disorder (HCC)    hx fibromyalgia    Osteopenia    Pneumonia    hx   Pseudomonas infection    Rotator cuff syndrome of right shoulder    Spinal stenosis      Family History  Problem Relation Age of Onset   Heart attack Mother        Deceased age 64   Colon cancer Father 31       deceased age 12 from colon ca.   Hypertension Brother    Colon polyps Daughter    Uterine cancer Daughter 103   Brain cancer Maternal Uncle    Stroke Maternal Grandmother    Other Paternal Uncle 12       possible cancer   Melanoma Daughter 51   Melanoma Niece 68       d. 30   Esophageal cancer Neg Hx    Stomach cancer Neg Hx    Rectal cancer Neg Hx      Past Surgical History:  Procedure Laterality Date   CATARACT EXTRACTION Right    COLONOSCOPY     left breast lumpectomy and LN dissection  06/1992   Dr. Ezzard Standing   LUNG BIOPSY  1968   TSurg---Dr. Bascom Levels   LUNG BIOPSY  2012   dr Kriste Basque   POLYPECTOMY     REVERSE SHOULDER ARTHROPLASTY Right 08/02/2015   Procedure: RIGHT REVERSE TOTAL SHOULDER ARTHROPLASTY;  Surgeon: Beverely Low, MD;  Location: [facility] OR;  Service: Orthopedics;  Laterality: Right;   REVERSE TOTAL SHOULDER ARTHROPLASTY Right 08/02/2015   TONSILLECTOMY     TOTAL KNEE ARTHROPLASTY Right 01/21/2015   Procedure: TOTAL RIGHT KNEE ARTHROPLASTY;  Surgeon: Durene Romans, MD;  Location: [facility] ORS;  Service: Orthopedics;  Laterality: Right;   TOTAL KNEE ARTHROPLASTY Left 03/23/2016   Procedure: LEFT TOTAL KNEE ARTHROPLASTY;  Surgeon: Durene Romans, MD;  Location: [facility] ORS;  Service: Orthopedics;  Laterality: Left;    Social History   Socioeconomic History   Marital status: Married    Spouse name: Not on file   Number of  children: 5   Years of education: Not on file   Highest education level: Not on file  Occupational History   Occupation: Retired  Tobacco Use   Smoking status: Former    Current packs/day: 0.00    Average packs/day: 0.3 packs/day for 40.0 years (10.0 ttl pk-yrs)    Types: Cigarettes    Start date: 02/24/1927    Quit date: 02/24/1967    Years since quitting:  55.6   Smokeless tobacco: Never  Vaping Use   Vaping status: Never Used  Substance and Sexual Activity   Alcohol use: Yes    Alcohol/week: 1.0 standard drink of alcohol    Types: 1 Standard drinks or equivalent per week    Comment: occasionally   Drug use: No   Sexual activity: Not Currently    Partners: Male    Comment: husband vasectomy  Other Topics Concern   Not on file  Social History Narrative   Not on file   Social Determinants of Health   Financial Resource Strain: Low Risk  (03/27/2022)   Overall Financial Resource Strain (CARDIA)    Difficulty of Paying Living Expenses: Not hard at all  Food Insecurity: No Food Insecurity (03/27/2022)   Hunger Vital Sign    Worried About Running Out of Food in the Last Year: Never true    Ran Out of Food in the Last Year: Never true  Transportation Needs: No Transportation Needs (03/27/2022)   PRAPARE - Administrator, Civil Service (Medical): No    Lack of Transportation (Non-Medical): No  Physical Activity: Sufficiently Active (03/27/2022)   Exercise Vital Sign    Days of Exercise per Week: 5 days    Minutes of Exercise per Session: 30 min  Stress: No Stress Concern Present (03/27/2022)   [employer] of Occupational Health - Occupational Stress Questionnaire    Feeling of Stress : Not at all  Social Connections: Socially Integrated (03/27/2022)   Social Connection and Isolation Panel [NHANES]    Frequency of Communication with Friends and Family: More than three times a week    Frequency of Social Gatherings with Friends and Family: Once a week    Attends  Religious Services: 1 to 4 times per year    Active Member of [employer] or Organizations: Yes    Attends Banker Meetings: 1 to 4 times per year    Marital Status: Married  Catering manager Violence: Not At Risk (03/27/2022)   Humiliation, Afraid, Rape, and Kick questionnaire    Fear of Current or Ex-Partner: No    Emotionally Abused: No    Physically Abused: No    Sexually Abused: No     No Known Allergies   Outpatient Medications Prior to Visit  Medication Sig Dispense Refill   albuterol (VENTOLIN HFA) 108 (90 Base) MCG/ACT inhaler TAKE 2 PUFFS BY MOUTH EVERY 6 HOURS AS NEEDED FOR WHEEZE OR SHORTNESS OF BREATH 8.5 each 6   ASPIRIN 81 PO Take 1 tablet by mouth daily.     Calcium Carb-Cholecalciferol 600-800 MG-UNIT TABS Take 1 tablet by mouth daily.     diltiazem (CARDIZEM CD) 120 MG 24 hr capsule TAKE 1 CAPSULE BY MOUTH EVERY DAY 90 capsule 3   guaiFENesin (MUCINEX) 600 MG 12 hr tablet Take by mouth 2 (two) times daily.     losartan (COZAAR) 25 MG tablet Take 0.5 tablets (12.5 mg total) by mouth daily. Please call [phone] to schedule an appointment for future refills. Thank you. 45 tablet 0   LUMIGAN 0.01 % SOLN      Multiple Vitamins-Minerals (MULTIVITAMIN & MINERAL PO) Take 1 tablet by mouth daily.     nitrofurantoin, macrocrystal-monohydrate, (MACROBID) 100 MG capsule Take 1 capsule (100 mg total) by mouth 2 (two) times daily. 14 capsule 0   nitroGLYCERIN (NITROSTAT) 0.4 MG SL tablet Place 1 tablet (0.4 mg total) under the tongue every 5 (five) minutes as needed for chest pain.  Please call [phone] to make appointment with Dr. Tenny Craw for future refills. 25 tablet 0   Nutritional Supplements (VITAMIN D BOOSTER PO) Take by mouth.     Respiratory Therapy Supplies (FLUTTER) DEVI 1 application by Does not apply route 3 (three) times daily. 1 each 0   Saccharomyces boulardii (FLORASTOR PO) Take 1 capsule by mouth daily.     SODIUM FLUORIDE 5000 SENSITIVE 1.1-5 % GEL Take by  mouth as directed.     Tiotropium Bromide-Olodaterol (STIOLTO RESPIMAT) 2.5-2.5 MCG/ACT AERS INHALE 2 PUFFS BY MOUTH INTO THE LUNGS DAILY 4 g 7   Zinc 30 MG CAPS      No facility-administered medications prior to visit.    Review of Systems  Constitutional:  Negative for chills, fever, malaise/fatigue and weight loss.  HENT:  Negative for hearing loss, sore throat and tinnitus.   Eyes:  Negative for blurred vision and double vision.  Respiratory:  Positive for cough, sputum production and shortness of breath. Negative for hemoptysis, wheezing and stridor.   Cardiovascular:  Negative for chest pain, palpitations, orthopnea, leg swelling and PND.  Gastrointestinal:  Negative for abdominal pain, constipation, diarrhea, heartburn, nausea and vomiting.  Genitourinary:  Negative for dysuria, hematuria and urgency.  Musculoskeletal:  Negative for joint pain and myalgias.  Skin:  Negative for itching and rash.  Neurological:  Negative for dizziness, tingling, weakness and headaches.  Endo/Heme/Allergies:  Negative for environmental allergies. Does not bruise/bleed easily.  Psychiatric/Behavioral:  Negative for depression. The patient is not nervous/anxious and does not have insomnia.   All other systems reviewed and are negative.    Objective:  Physical Exam Vitals reviewed.  Constitutional:      General: She is not in acute distress.    Appearance: She is well-developed.  HENT:     Head: Normocephalic and atraumatic.  Eyes:     General: No scleral icterus.    Conjunctiva/sclera: Conjunctivae normal.     Pupils: Pupils are equal, round, and reactive to light.  Neck:     Vascular: No JVD.     Trachea: No tracheal deviation.  Cardiovascular:     Rate and Rhythm: Normal rate and regular rhythm.     Heart sounds: Normal heart sounds. No murmur heard. Pulmonary:     Effort: Pulmonary effort is normal. No tachypnea, accessory muscle usage or respiratory distress.     Breath sounds: No  stridor. Rhonchi present. No wheezing or rales.  Abdominal:     General: Bowel sounds are normal. There is no distension.     Palpations: Abdomen is soft.     Tenderness: There is no abdominal tenderness.  Musculoskeletal:        General: No tenderness.     Cervical back: Neck supple.  Lymphadenopathy:     Cervical: No cervical adenopathy.  Skin:    General: Skin is warm and dry.     Capillary Refill: Capillary refill takes less than 2 seconds.     Findings: No rash.  Neurological:     Mental Status: She is alert and oriented to person, place, and time.  Psychiatric:        Behavior: Behavior normal.      There were no vitals filed for this visit.    on RA BMI Readings from Last 3 Encounters:  04/02/22 22.99 kg/m  03/27/22 23.17 kg/m  01/02/22 23.38 kg/m   Wt Readings from Last 3 Encounters:  04/02/22 129 lb 12.8 oz (58.9 kg)  03/27/22 130 lb 12.8  oz (59.3 kg)  01/02/22 132 lb (59.9 kg)    Chest Imaging:  CT Chest in 2004: IMPRESSION 1.  MULTIPLE AREAS OF BRONCHIECTATIC CHANGE WITH ASSOCIATED DISTAL ALVEOLAR INFILTRATES SUGGESTING POSTOBSTRUCTIVE PNEUMONITIS AND/OR ATELECTASIS. SEVERAL OF THE DILATED BRONCHI SHOW INTRALUMINAL MATERIAL AND SUGGEST THE PRESENCE OF MUCOUS PLUGS. 2.  AREA OF FOCAL PLEURAL-BASED DENSITY IN THE RIGHT LOWER WHICH DOES NOT APPEAR TO BE RELATED TO ASSOCIATED BRONCHIECTASIS AND IS MORE WORRISOME FOR AN AREA OF FOCAL NEOPLASTIC CHANGE OR POSSIBLY A FOCUS OF ROUNDED PNEUMONIA.   AN AREA OF FOCAL INFARCTION WOULD BE A THIRD CONSIDERATION IN THE APPROPRIATE CLINICAL SETTING.  SHORT-TERM FOLLOW-UP WITH CT CAN BE UNDERTAKEN TO ASSESS FOR SOME DEGREE OF INTERVAL RESOLUTION IN TWO TO THREE WEEKS' TIME AS MIGHT BE EXPECTED WITH EITHER A PNEUMONITIS OR A RESOLVING AREA OF INFARCTION.  IF NO INTERVAL RESOLUTION IS APPRECIATED OVER THIS PERIOD OF TIME, THEN A NEOPLASTIC PROCESS WOULD BE A MORE WORRISOME CONSIDERATION AND TISSUE SAMPLING WOULD BE  RECOMMENDED AT THAT POINT.  2018 chest x-ray: Chronic interstitial changes within the bilateral bases. The patient's images have been independently reviewed by me.    12/16/2018 CT chest: Areas of cystic bronchiectasis within the lower lobes, dilated bronchi with mucus plugging.  Patient also has few scattered small lung nodules.  Enlarged pulmonary arteries concerning for pulmonary hypertension. The patient's images have been independently reviewed by me.    October 2021 CT chest: Significant areas of cystic and varicoid bronchiectasis dilated airways as compared to previous imaging nodule stable. The patient's images have been independently reviewed by me.    October 2022 CT chest: Significant cystic and varicoid bronchiectasis, multiple pulmonary nodules all stable. The patient's images have been independently reviewed by me.    Pulmonary Functions Testing Results:     No data to display           FeNO: None   Pathology: None   Echocardiogram:   09/2018: IMPRESSIONS      1. The left ventricle has normal systolic function with an ejection fraction of 60-65%. The cavity size was normal. Left ventricular diastolic Doppler parameters are consistent with pseudonormalization.  2. The right ventricle has normal systolic function. The cavity was normal. There is no increase in right ventricular wall thickness.  3. No evidence of mitral valve stenosis.  4. No stenosis of the aortic valve.  5. The aorta is normal in size and structure.  6. The aortic root and ascending aorta are normal in size and structure.  7. Grossly normal.  Heart Catheterization: None     Assessment & Plan:   No diagnosis found.   Assessment:  This is an 89 year old female with a longstanding history of bronchiectasis currently managed with postural drainage, flutter valve and airway clearance techniques.  She is routinely using exercise to help with this including cycling.  She is on a regular  bronchodilator regimen and doing well with Stiolto plus as needed albuterol.  Pulmonary nodules have been stable.  She is not had any exacerbations within the past year.  Plan: Continue current Stiolto regimen As needed albuterol Would like to avoid ICS in the setting of bronchiectasis. She has not had any exacerbations within the past year. If her sputum production becomes too difficult to handle with her current techniques I think the next step would only be to add a vest therapy. She understands this. Otherwise when to continue as we are.  As for following of bronchiectasis with CT imaging.  I do not think she needs another repeat CT until 2 to 3 years from now. Patient is also agreeable to this plan. We reviewed her CT imaging as described above today in the office with her and her husband.  Return to clinic in 1 year or as needed.    Josephine Igo, DO [facility] 10/11/2022 2:47 PM

## 2022-10-12 ENCOUNTER — Encounter: Payer: Self-pay | Admitting: Pulmonary Disease

## 2022-10-12 ENCOUNTER — Ambulatory Visit (INDEPENDENT_AMBULATORY_CARE_PROVIDER_SITE_OTHER): Payer: Medicare PPO | Admitting: Pulmonary Disease

## 2022-10-12 VITALS — BP 120/60 | HR 84 | Ht 63.0 in | Wt 128.6 lb

## 2022-10-12 DIAGNOSIS — R093 Abnormal sputum: Secondary | ICD-10-CM | POA: Diagnosis not present

## 2022-10-12 DIAGNOSIS — R918 Other nonspecific abnormal finding of lung field: Secondary | ICD-10-CM | POA: Diagnosis not present

## 2022-10-12 DIAGNOSIS — J479 Bronchiectasis, uncomplicated: Secondary | ICD-10-CM | POA: Diagnosis not present

## 2022-10-12 NOTE — Patient Instructions (Addendum)
Thank you for visiting Dr. Tonia Brooms at [facility]. Today we recommend the following:  Stiolto daily  Albuterol as needed  Postural drainage Flutter valve   Return in about 1 year (around 10/12/2023), or if symptoms worsen or fail to improve.    Please do your part to reduce the spread of COVID-19.

## 2022-10-20 ENCOUNTER — Encounter: Payer: Self-pay | Admitting: Internal Medicine

## 2022-10-20 NOTE — Telephone Encounter (Signed)
Pt would need OV, thanks

## 2022-10-22 ENCOUNTER — Ambulatory Visit (INDEPENDENT_AMBULATORY_CARE_PROVIDER_SITE_OTHER): Payer: Medicare PPO | Admitting: Internal Medicine

## 2022-10-22 ENCOUNTER — Ambulatory Visit (INDEPENDENT_AMBULATORY_CARE_PROVIDER_SITE_OTHER): Payer: Medicare PPO

## 2022-10-22 ENCOUNTER — Encounter: Payer: Self-pay | Admitting: Internal Medicine

## 2022-10-22 VITALS — BP 124/72 | HR 80 | Temp 98.2°F | Ht 63.0 in | Wt 128.0 lb

## 2022-10-22 DIAGNOSIS — I10 Essential (primary) hypertension: Secondary | ICD-10-CM | POA: Diagnosis not present

## 2022-10-22 DIAGNOSIS — R1011 Right upper quadrant pain: Secondary | ICD-10-CM | POA: Insufficient documentation

## 2022-10-22 DIAGNOSIS — Z0000 Encounter for general adult medical examination without abnormal findings: Secondary | ICD-10-CM

## 2022-10-22 DIAGNOSIS — E785 Hyperlipidemia, unspecified: Secondary | ICD-10-CM

## 2022-10-22 DIAGNOSIS — R739 Hyperglycemia, unspecified: Secondary | ICD-10-CM

## 2022-10-22 DIAGNOSIS — B9620 Unspecified Escherichia coli [E. coli] as the cause of diseases classified elsewhere: Secondary | ICD-10-CM | POA: Insufficient documentation

## 2022-10-22 DIAGNOSIS — M5442 Lumbago with sciatica, left side: Secondary | ICD-10-CM | POA: Diagnosis not present

## 2022-10-22 DIAGNOSIS — R1909 Other intra-abdominal and pelvic swelling, mass and lump: Secondary | ICD-10-CM

## 2022-10-22 DIAGNOSIS — N390 Urinary tract infection, site not specified: Secondary | ICD-10-CM | POA: Diagnosis not present

## 2022-10-22 DIAGNOSIS — I700 Atherosclerosis of aorta: Secondary | ICD-10-CM | POA: Diagnosis not present

## 2022-10-22 DIAGNOSIS — R0600 Dyspnea, unspecified: Secondary | ICD-10-CM | POA: Diagnosis not present

## 2022-10-22 DIAGNOSIS — Z0001 Encounter for general adult medical examination with abnormal findings: Secondary | ICD-10-CM

## 2022-10-22 DIAGNOSIS — J479 Bronchiectasis, uncomplicated: Secondary | ICD-10-CM | POA: Diagnosis not present

## 2022-10-22 DIAGNOSIS — R918 Other nonspecific abnormal finding of lung field: Secondary | ICD-10-CM | POA: Diagnosis not present

## 2022-10-22 LAB — URINALYSIS, ROUTINE W REFLEX MICROSCOPIC
Bilirubin Urine: NEGATIVE
Hgb urine dipstick: NEGATIVE
Ketones, ur: NEGATIVE
Nitrite: NEGATIVE
Specific Gravity, Urine: 1.01 (ref 1.000–1.030)
Total Protein, Urine: NEGATIVE
Urine Glucose: NEGATIVE
Urobilinogen, UA: 0.2 (ref 0.0–1.0)
pH: 7 (ref 5.0–8.0)

## 2022-10-22 LAB — BASIC METABOLIC PANEL
BUN: 14 mg/dL (ref 6–23)
CO2: 29 mEq/L (ref 19–32)
Calcium: 9.4 mg/dL (ref 8.4–10.5)
Chloride: 94 mEq/L — ABNORMAL LOW (ref 96–112)
Creatinine, Ser: 0.63 mg/dL (ref 0.40–1.20)
GFR: 79.76 mL/min (ref >60.00)
Glucose, Bld: 88 mg/dL (ref 70–99)
Potassium: 4 mEq/L (ref 3.5–5.1)
Sodium: 129 mEq/L — ABNORMAL LOW (ref 135–145)

## 2022-10-22 LAB — CBC WITH DIFFERENTIAL/PLATELET
Basophils Absolute: 0.1 10*3/uL (ref 0.0–0.1)
Basophils Relative: 0.8 % (ref 0.0–3.0)
Eosinophils Absolute: 0.2 10*3/uL (ref 0.0–0.7)
Eosinophils Relative: 3.7 % (ref 0.0–5.0)
HCT: 37.2 % (ref 36.0–46.0)
Hemoglobin: 12.5 g/dL (ref 12.0–15.0)
Lymphocytes Relative: 17.8 % (ref 12.0–46.0)
Lymphs Abs: 1.2 10*3/uL (ref 0.7–4.0)
MCHC: 33.5 g/dL (ref 30.0–36.0)
MCV: 98.1 fl (ref 78.0–100.0)
Monocytes Absolute: 0.6 10*3/uL (ref 0.1–1.0)
Monocytes Relative: 9.6 % (ref 3.0–12.0)
Neutro Abs: 4.6 10*3/uL (ref 1.4–7.7)
Neutrophils Relative %: 68.1 % (ref 43.0–77.0)
Platelets: 272 10*3/uL (ref 150.0–400.0)
RBC: 3.79 Mil/uL — ABNORMAL LOW (ref 3.87–5.11)
RDW: 12.6 % (ref 11.5–15.5)
WBC: 6.7 10*3/uL (ref 4.0–10.5)

## 2022-10-22 LAB — HEPATIC FUNCTION PANEL
ALT: 17 U/L (ref 0–35)
AST: 28 U/L (ref 0–37)
Albumin: 4 g/dL (ref 3.5–5.2)
Alkaline Phosphatase: 70 U/L (ref 39–117)
Bilirubin, Direct: 0.1 mg/dL (ref 0.0–0.3)
Total Bilirubin: 0.5 mg/dL (ref 0.2–1.2)
Total Protein: 7.7 g/dL (ref 6.0–8.3)

## 2022-10-22 LAB — LIPASE: Lipase: 14 U/L (ref 11.0–59.0)

## 2022-10-22 NOTE — Assessment & Plan Note (Signed)
Lab Results  Component Value Date   HGBA1C 5.8 03/24/2022   Stable, pt to continue current medical treatment  - diet, wt control

## 2022-10-22 NOTE — Assessment & Plan Note (Signed)
Also for ua and culture but suspect right flank pain less likely GU related, also for CT as above

## 2022-10-22 NOTE — Progress Notes (Signed)
Patient ID: Miranda Thompson, female   DOB: June 12, 1933, 89 y.o.   MRN: [id number]         Chief Complaint:: wellness exam and Back Pain (Has gotten worse on lower right side of the back , would like to know if she can do massage therapy and would like CT scan )  , uti, lower back pain, hld, uti, ruq pain, suprapubic mass       HPI:  Miranda Thompson is a 89 y.o. female here for wellness exam; declines covid booster, colonoscopy, and for flu shot at pharmacy, o/w up to date                        Also Pt continues to have recurring LBP without change in severity, bowel or bladder change, fever, wt loss,  worsening LE pain/numbness/weakness, gait change or falls. But also has mild intermitent RUQ pain with some radiation around the right side and flank area, Did have UTI recently now Denies urinary symptoms such as dysuria, frequency, urgency, flank pain, hematuria or n/v, fever, chills.  Pt denies chest pain, wheezing, orthopnea, PND, increased LE swelling, palpitations, dizziness or syncope, but has mild sob at baseline with hx of bronchiectasis.    Wt Readings from Last 3 Encounters:  10/22/22 128 lb (58.1 kg)  10/12/22 128 lb 9.6 oz (58.3 kg)  04/02/22 129 lb 12.8 oz (58.9 kg)   BP Readings from Last 3 Encounters:  10/22/22 124/72  10/12/22 120/60  09/18/22 (!) 164/72   Immunization History  Administered Date(s) Administered   COVID-19, mRNA, vaccine(Comirnaty)12 years and older 11/14/2021   Fluad Quad(high Dose 65+) 11/08/2019, 11/14/2020, 11/19/2021   Influenza Split 12/03/2011   Influenza Whole 11/30/2007, 11/30/2008, 12/03/2009, 11/24/2010   Influenza, High Dose Seasonal PF 11/19/2015, 12/02/2016, 12/17/2017, 11/22/2018   Influenza,inj,Quad PF,6+ Mos 11/10/2012, 11/08/2013, 11/06/2014   Influenza-Unspecified 12/02/2016, 11/22/2018, 11/14/2021   PFIZER Comirnaty(Gray Top)Covid-19 Tri-Sucrose Vaccine 05/27/2020   PFIZER(Purple Top)SARS-COV-2 Vaccination 03/14/2019, 04/04/2019,  11/18/2019   Pneumococcal Conjugate-13 05/09/2014   Pneumococcal Polysaccharide-23 11/07/2007   Tdap 05/09/2014   Zoster Recombinant(Shingrix) 04/10/2018, 01/08/2019   Zoster, Live 12/11/2009   Health Maintenance Due  Topic Date Due   Colonoscopy  08/08/2019   COVID-19 Vaccine (6 - 2023-24 season) 01/09/2022   INFLUENZA VACCINE  09/24/2022      Past Medical History:  Diagnosis Date   Adenomatous colon polyp    Allergic rhinitis    Anemia    pt states no anemia in her past hx thst she is aware of    Atrial fibrillation (HCC)    goes in and out of this rhythm- takes Tenormin   Breast cancer, left breast (HCC) 1994 with lumpectomy   chemo,tamox,radiation   Bronchiectasis with acute exacerbation (HCC)    COPD (chronic obstructive pulmonary disease) (HCC)    Diverticulosis of colon    DJD (degenerative joint disease)    Dysrhythmia    palpitations   Family history of brain cancer    Family history of colon cancer 02/14/2018   Family history of colon cancer    Family history of malignant neoplasm of gastrointestinal tract    Family history of melanoma    Family history of uterine cancer    Fibromyalgia    GERD (gastroesophageal reflux disease) 02/14/2018   Glaucoma    Hemoptysis    History of pneumonia    Hypercholesteremia    pt denies   Hypertension    Low back pain  syndrome    Neuromuscular disorder (HCC)    hx fibromyalgia    Osteopenia    Pneumonia    hx   Pseudomonas infection    Rotator cuff syndrome of right shoulder    Spinal stenosis    Past Surgical History:  Procedure Laterality Date   CATARACT EXTRACTION Right    COLONOSCOPY     left breast lumpectomy and LN dissection  06/1992   Dr. Ezzard Standing   LUNG BIOPSY  1968   TSurg---Dr. Bascom Levels   LUNG BIOPSY  2012   dr Kriste Basque   POLYPECTOMY     REVERSE SHOULDER ARTHROPLASTY Right 08/02/2015   Procedure: RIGHT REVERSE TOTAL SHOULDER ARTHROPLASTY;  Surgeon: Beverely Low, MD;  Location: [facility] OR;  Service:  Orthopedics;  Laterality: Right;   REVERSE TOTAL SHOULDER ARTHROPLASTY Right 08/02/2015   TONSILLECTOMY     TOTAL KNEE ARTHROPLASTY Right 01/21/2015   Procedure: TOTAL RIGHT KNEE ARTHROPLASTY;  Surgeon: Durene Romans, MD;  Location: [facility] ORS;  Service: Orthopedics;  Laterality: Right;   TOTAL KNEE ARTHROPLASTY Left 03/23/2016   Procedure: LEFT TOTAL KNEE ARTHROPLASTY;  Surgeon: Durene Romans, MD;  Location: [facility] ORS;  Service: Orthopedics;  Laterality: Left;    reports that she quit smoking about 55 years ago. Her smoking use included cigarettes. She has never used smokeless tobacco. She reports current alcohol use of about 1.0 standard drink of alcohol per week. She reports that she does not use drugs. family history includes Brain cancer in her maternal uncle; Colon cancer (age of onset: 26) in her father; Colon polyps in her daughter; Heart attack in her mother; Hypertension in her brother; Melanoma (age of onset: 70) in her niece; Melanoma (age of onset: 76) in her daughter; Other (age of onset: 83) in her paternal uncle; Stroke in her maternal grandmother; Uterine cancer (age of onset: 47) in her daughter. No Known Allergies Current Outpatient Medications on File Prior to Visit  Medication Sig Dispense Refill   albuterol (VENTOLIN HFA) 108 (90 Base) MCG/ACT inhaler TAKE 2 PUFFS BY MOUTH EVERY 6 HOURS AS NEEDED FOR WHEEZE OR SHORTNESS OF BREATH 8.5 each 6   ASPIRIN 81 PO Take 1 tablet by mouth daily.     Calcium Carb-Cholecalciferol 600-800 MG-UNIT TABS Take 1 tablet by mouth daily.     diltiazem (CARDIZEM CD) 120 MG 24 hr capsule TAKE 1 CAPSULE BY MOUTH EVERY DAY 90 capsule 3   guaiFENesin (MUCINEX) 600 MG 12 hr tablet Take by mouth 2 (two) times daily.     losartan (COZAAR) 25 MG tablet Take 0.5 tablets (12.5 mg total) by mouth daily. Please call [phone] to schedule an appointment for future refills. Thank you. 45 tablet 0   LUMIGAN 0.01 % SOLN      Multiple Vitamins-Minerals (MULTIVITAMIN &  MINERAL PO) Take 1 tablet by mouth daily.     nitrofurantoin, macrocrystal-monohydrate, (MACROBID) 100 MG capsule Take 1 capsule (100 mg total) by mouth 2 (two) times daily. 14 capsule 0   nitroGLYCERIN (NITROSTAT) 0.4 MG SL tablet Place 1 tablet (0.4 mg total) under the tongue every 5 (five) minutes as needed for chest pain. Please call [phone] to make appointment with Dr. Tenny Craw for future refills. 25 tablet 0   Nutritional Supplements (VITAMIN D BOOSTER PO) Take by mouth.     Respiratory Therapy Supplies (FLUTTER) DEVI 1 application by Does not apply route 3 (three) times daily. 1 each 0   Saccharomyces boulardii (FLORASTOR PO) Take 1 capsule by mouth daily.  SODIUM FLUORIDE 5000 SENSITIVE 1.1-5 % GEL Take by mouth as directed.     Tiotropium Bromide-Olodaterol (STIOLTO RESPIMAT) 2.5-2.5 MCG/ACT AERS INHALE 2 PUFFS BY MOUTH INTO THE LUNGS DAILY 4 g 7   Zinc 30 MG CAPS      No current facility-administered medications on file prior to visit.        ROS:  All others reviewed and negative.  Objective        PE:  BP 124/72 (BP Location: Right Arm, Patient Position: Sitting, Cuff Size: Normal)   Pulse 80   Temp 98.2 F (36.8 C) (Oral)   Ht 5\' 3"  (1.6 m)   Wt 128 lb (58.1 kg)   LMP 02/23/1985   SpO2 98%   BMI 22.67 kg/m                 Constitutional: Pt appears in NAD               HENT: Head: NCAT.                Right Ear: External ear normal.                 Left Ear: External ear normal.                Eyes: . Pupils are equal, round, and reactive to light. Conjunctivae and EOM are normal               Nose: without d/c or deformity               Neck: Neck supple. Gross normal ROM               Cardiovascular: Normal rate and regular rhythm.                 Pulmonary/Chest: Effort normal and breath sounds with scattered wheezes and crackles mostly left upper lung field                Abd:  Soft, NT, ND, + BS, no organomegaly but has firm mass palpable nontender but smooth  regular low mid abd suprapubic difficult to estimate size               Neurological: Pt is alert. At baseline orientation, motor grossly intact               Skin: Skin is warm. No rashes, no other new lesions, LE edema - none               Psychiatric: Pt behavior is normal without agitation   Micro: none  Cardiac tracings I have personally interpreted today:  none  Pertinent Radiological findings (summarize): none   Lab Results  Component Value Date   WBC 6.7 10/22/2022   HGB 12.5 10/22/2022   HCT 37.2 10/22/2022   PLT 272.0 10/22/2022   GLUCOSE 88 10/22/2022   CHOL 162 03/24/2022   TRIG 65.0 03/24/2022   HDL 62.60 03/24/2022   LDLCALC 87 03/24/2022   ALT 17 10/22/2022   AST 28 10/22/2022   NA 129 (L) 10/22/2022   K 4.0 10/22/2022   CL 94 (L) 10/22/2022   CREATININE 0.63 10/22/2022   BUN 14 10/22/2022   CO2 29 10/22/2022   TSH 3.24 03/24/2022   INR 1.06 01/14/2015   HGBA1C 5.8 03/24/2022   MICROALBUR <0.7 03/24/2022   Assessment/Plan:  Miranda Thompson is a 89 y.o. White or Caucasian [1] female with  has  a past medical history of Adenomatous colon polyp, Allergic rhinitis, Anemia, Atrial fibrillation (HCC), Breast cancer, left breast (HCC) (1994 with lumpectomy), Bronchiectasis with acute exacerbation (HCC), COPD (chronic obstructive pulmonary disease) (HCC), Diverticulosis of colon, DJD (degenerative joint disease), Dysrhythmia, Family history of brain cancer, Family history of colon cancer (02/14/2018), Family history of colon cancer, Family history of malignant neoplasm of gastrointestinal tract, Family history of melanoma, Family history of uterine cancer, Fibromyalgia, GERD (gastroesophageal reflux disease) (02/14/2018), Glaucoma, Hemoptysis, History of pneumonia, Hypercholesteremia, Hypertension, Low back pain syndrome, Neuromuscular disorder (HCC), Osteopenia, Pneumonia, Pseudomonas infection, Rotator cuff syndrome of right shoulder, and Spinal stenosis.  Encounter for  well adult exam with abnormal findings Age and sex appropriate education and counseling updated with regular exercise and diet Referrals for preventative services - decliens colonoscopy due to age Immunizations addressed - declines covid booster, for flu shot at pharmacy Smoking counseling  - none needed Evidence for depression or other mood disorder - none significant Most recent labs reviewed. I have personally reviewed and have noted: 1) the patient's medical and social history 2) The patient's current medications and supplements 3) The patient's height, weight, and BMI have been recorded in the chart   Suprapubic mass I suspect fibroid uterus but no hx of this per pt, for Ct abd/pelvis  RUQ pain Etiology unclear, can't r/o GB vs other - also for CT abd/pelvis  Low back pain Chronic stable likely underlying DJD DDD, cont same tx  Hyperglycemia Lab Results  Component Value Date   HGBA1C 5.8 03/24/2022   Stable, pt to continue current medical treatment  - diet, wt control   Episode of hypertension BP Readings from Last 3 Encounters:  10/22/22 124/72  10/12/22 120/60  09/18/22 (!) 164/72   Stable, pt to continue medical treatment card cd 120 every day, losartan 25  - 1/2 qd    Dyspnea Chronic stable, likely due to underlying bronchiectasis clinically stable overall, but for cxr  UTI (urinary tract infection) Also for ua and culture but suspect right flank pain less likely GU related, also for CT as above  Elevated lipids Lab Results  Component Value Date   LDLCALC 87 03/24/2022   Uncontrolled, goal ldl < 70, pt to continue low chol diet, declines statin  Followup: Return in about 6 months (around 04/23/2023).  Oliver Barre, MD 10/22/2022 8:37 PM [facility] [place]

## 2022-10-22 NOTE — Assessment & Plan Note (Signed)
Chronic stable, likely due to underlying bronchiectasis clinically stable overall, but for cxr

## 2022-10-22 NOTE — Assessment & Plan Note (Signed)
Etiology unclear, can't r/o GB vs other - also for CT abd/pelvis

## 2022-10-22 NOTE — Patient Instructions (Addendum)
Please continue all other medications as before, and refills have been done if requested.  Please have the pharmacy call with any other refills you may need.  Please continue your efforts at being more active, low cholesterol diet, and weight control.  You are otherwise up to date with prevention measures today.  Please keep your appointments with your specialists as you may have planned - oct 22 with Dr Tenny Craw Cardiology  You will be contacted regarding the referral for: CT abd pelvis  Please go to the XRAY Department in the first floor for the x-ray testing  Please go to the LAB at the blood drawing area for the tests to be done  You will be contacted by phone if any changes need to be made immediately.  Otherwise, you will receive a letter about your results with an explanation, but please check with MyChart first.  Please make an Appointment to return in 6 months, or sooner if needed

## 2022-10-22 NOTE — Assessment & Plan Note (Signed)
Lab Results  Component Value Date   LDLCALC 87 03/24/2022   Uncontrolled, goal ldl < 70, pt to continue low chol diet, declines statin

## 2022-10-22 NOTE — Assessment & Plan Note (Signed)
Chronic stable likely underlying DJD DDD, cont same tx

## 2022-10-22 NOTE — Assessment & Plan Note (Signed)
BP Readings from Last 3 Encounters:  10/22/22 124/72  10/12/22 120/60  09/18/22 (!) 164/72   Stable, pt to continue medical treatment card cd 120 every day, losartan 25  - 1/2 qd

## 2022-10-22 NOTE — Assessment & Plan Note (Signed)
I suspect fibroid uterus but no hx of this per pt, for Ct abd/pelvis

## 2022-10-22 NOTE — Progress Notes (Signed)
The test results show that your current treatment is OK, as the tests are stable.  Please continue the same plan.  There is no other need for change of treatment or further evaluation based on these results, at this time.  thanks 

## 2022-10-22 NOTE — Assessment & Plan Note (Signed)
Age and sex appropriate education and counseling updated with regular exercise and diet Referrals for preventative services - decliens colonoscopy due to age Immunizations addressed - declines covid booster, for flu shot at pharmacy Smoking counseling  - none needed Evidence for depression or other mood disorder - none significant Most recent labs reviewed. I have personally reviewed and have noted: 1) the patient's medical and social history 2) The patient's current medications and supplements 3) The patient's height, weight, and BMI have been recorded in the chart

## 2022-10-23 ENCOUNTER — Encounter: Payer: Self-pay | Admitting: Internal Medicine

## 2022-10-24 LAB — URINE CULTURE: Result:: NO GROWTH

## 2022-10-28 ENCOUNTER — Ambulatory Visit
Admission: RE | Admit: 2022-10-28 | Discharge: 2022-10-28 | Disposition: A | Discharge status: Home / Self Care | Payer: Medicare PPO | Source: Ambulatory Visit | Attending: Internal Medicine | Admitting: Internal Medicine

## 2022-10-28 DIAGNOSIS — R1011 Right upper quadrant pain: Secondary | ICD-10-CM | POA: Diagnosis not present

## 2022-10-28 DIAGNOSIS — I700 Atherosclerosis of aorta: Secondary | ICD-10-CM | POA: Diagnosis not present

## 2022-10-28 MED ORDER — IOPAMIDOL (ISOVUE-300) INJECTION 61%
100.0000 mL | Freq: Once | INTRAVENOUS | Status: AC | PRN
  Administered 2022-10-28: 100 mL via INTRAVENOUS

## 2022-11-05 DIAGNOSIS — Z1231 Encounter for screening mammogram for malignant neoplasm of breast: Secondary | ICD-10-CM | POA: Diagnosis not present

## 2022-11-05 DIAGNOSIS — Z853 Personal history of malignant neoplasm of breast: Secondary | ICD-10-CM | POA: Diagnosis not present

## 2022-11-05 DIAGNOSIS — J449 Chronic obstructive pulmonary disease, unspecified: Secondary | ICD-10-CM | POA: Diagnosis not present

## 2022-11-05 DIAGNOSIS — M8588 Other specified disorders of bone density and structure, other site: Secondary | ICD-10-CM | POA: Diagnosis not present

## 2022-11-06 ENCOUNTER — Encounter: Payer: Self-pay | Admitting: Radiology

## 2022-11-10 ENCOUNTER — Other Ambulatory Visit: Payer: Self-pay | Admitting: Pulmonary Disease

## 2022-11-20 ENCOUNTER — Other Ambulatory Visit: Payer: Self-pay | Admitting: Cardiovascular Disease

## 2022-12-09 ENCOUNTER — Encounter: Payer: Self-pay | Admitting: Pulmonary Disease

## 2022-12-09 ENCOUNTER — Other Ambulatory Visit: Payer: Self-pay

## 2022-12-09 ENCOUNTER — Other Ambulatory Visit: Payer: Self-pay | Admitting: Cardiovascular Disease

## 2022-12-09 MED ORDER — STIOLTO RESPIMAT 2.5-2.5 MCG/ACT IN AERS: 2.0000 | INHALATION_SPRAY | Freq: Every day | RESPIRATORY_TRACT | 1 refills | Status: DC

## 2022-12-15 ENCOUNTER — Ambulatory Visit: Payer: Medicare PPO | Attending: Internal Medicine | Admitting: Internal Medicine

## 2022-12-15 ENCOUNTER — Encounter: Payer: Self-pay | Admitting: Internal Medicine

## 2022-12-15 VITALS — BP 164/68 | HR 88 | Ht 63.0 in | Wt 130.2 lb

## 2022-12-15 DIAGNOSIS — E785 Hyperlipidemia, unspecified: Secondary | ICD-10-CM | POA: Diagnosis not present

## 2022-12-15 DIAGNOSIS — I10 Essential (primary) hypertension: Secondary | ICD-10-CM | POA: Diagnosis not present

## 2022-12-15 DIAGNOSIS — Z79899 Other long term (current) drug therapy: Secondary | ICD-10-CM

## 2022-12-15 NOTE — Patient Instructions (Signed)
Medication Instructions:   *If you need a refill on your cardiac medications before your next appointment, please call your pharmacy*   Lab Work: Nmr, apo b, lipo a, hgba1c  If you have labs (blood work) drawn today and your tests are completely normal, you will receive your results only by: MyChart Message (if you have MyChart) OR A paper copy in the mail If you have any lab test that is abnormal or we need to change your treatment, we will call you to review the results.   Testing/Procedures:    Follow-Up: At [facility], you and your health needs are our priority.  As part of our continuing mission to provide you with exceptional heart care, we have created designated Provider Care Teams.  These Care Teams include your primary Cardiologist (physician) and Advanced Practice Providers (APPs -  Physician Assistants and Nurse Practitioners) who all work together to provide you with the care you need, when you need it.  We recommend signing up for the patient portal called "MyChart".  Sign up information is provided on this After Visit Summary.  MyChart is used to connect with patients for Virtual Visits (Telemedicine).  Patients are able to view lab/test results, encounter notes, upcoming appointments, etc.  Non-urgent messages can be sent to your provider as well.   To learn more about what you can do with MyChart, go to [email or web address].    Your next appointment:   8 month(s)  Provider:   Dr Dietrich Pates    Other Instructions

## 2022-12-15 NOTE — Progress Notes (Signed)
Cardiology Office Note   Date:  12/15/2022   ID:  Miranda Thompson, Miranda Thompson 07-28-1933, MRN [id number]  PCP:  Corwin Levins, MD  Cardiologist:   Dr. Eden Emms, MD   Pt presents for follow up of CAD   HP Pt is an 89 yo with hx of CAD, HTN, PAD, PACs, PVCs  In 2023 CT coronary angiogram showed mild to mod CAD   Note that R PA was dilated    Echo showed normal LV and RV systolic function, normal chamber size  No evid for pulmonary HTN    I saw the pt in Oct 2023.  Since seen, she says she is doing OK  Has some days of "Very, very heavy legs"  Wakes up OK but then legs get heavy   No pains   Patient has had increased spells of heart racing   Will last about 1 min   Goes away on own with no dizziness   She says if she takes a deep breath that will help.  Can come on 1 to 10 x per day    She has aha a band like discomfort across chest, mainly when wears a bra. Denies CP with activity    Sleeping pretty good   Appeitite is "too good" Walks 1.5 miles several times per week   uses weights  No dizziness  No presyncope/syncope      Past Medical History:  Diagnosis Date   Adenomatous colon polyp    Allergic rhinitis    Anemia    pt states no anemia in her past hx thst she is aware of    Atrial fibrillation (HCC)    goes in and out of this rhythm- takes Tenormin   Breast cancer, left breast (HCC) 1994 with lumpectomy   chemo,tamox,radiation   Bronchiectasis with acute exacerbation (HCC)    COPD (chronic obstructive pulmonary disease) (HCC)    Diverticulosis of colon    DJD (degenerative joint disease)    Dysrhythmia    palpitations   Family history of brain cancer    Family history of colon cancer 02/14/2018   Family history of colon cancer    Family history of malignant neoplasm of gastrointestinal tract    Family history of melanoma    Family history of uterine cancer    Fibromyalgia    GERD (gastroesophageal reflux disease) 02/14/2018   Glaucoma    Hemoptysis    History of  pneumonia    Hypercholesteremia    pt denies   Hypertension    Low back pain syndrome    Neuromuscular disorder (HCC)    hx fibromyalgia    Osteopenia    Pneumonia    hx   Pseudomonas infection    Rotator cuff syndrome of right shoulder    Spinal stenosis     Past Surgical History:  Procedure Laterality Date   CATARACT EXTRACTION Right    COLONOSCOPY     left breast lumpectomy and LN dissection  06/1992   Dr. Ezzard Standing   LUNG BIOPSY  1968   TSurg---Dr. Bascom Levels   LUNG BIOPSY  2012   dr Kriste Basque   POLYPECTOMY     REVERSE SHOULDER ARTHROPLASTY Right 08/02/2015   Procedure: RIGHT REVERSE TOTAL SHOULDER ARTHROPLASTY;  Surgeon: Beverely Low, MD;  Location: [facility] OR;  Service: Orthopedics;  Laterality: Right;   REVERSE TOTAL SHOULDER ARTHROPLASTY Right 08/02/2015   TONSILLECTOMY     TOTAL KNEE ARTHROPLASTY Right 01/21/2015   Procedure: TOTAL RIGHT  KNEE ARTHROPLASTY;  Surgeon: Durene Romans, MD;  Location: [facility] ORS;  Service: Orthopedics;  Laterality: Right;   TOTAL KNEE ARTHROPLASTY Left 03/23/2016   Procedure: LEFT TOTAL KNEE ARTHROPLASTY;  Surgeon: Durene Romans, MD;  Location: [facility] ORS;  Service: Orthopedics;  Laterality: Left;     Current Outpatient Medications  Medication Sig Dispense Refill   albuterol (VENTOLIN HFA) 108 (90 Base) MCG/ACT inhaler TAKE 2 PUFFS BY MOUTH EVERY 6 HOURS AS NEEDED FOR WHEEZE OR SHORTNESS OF BREATH 8.5 each 6   ASPIRIN 81 PO Take 1 tablet by mouth daily.     Calcium Carb-Cholecalciferol 600-800 MG-UNIT TABS Take 1 tablet by mouth daily.     diltiazem (CARDIZEM CD) 120 MG 24 hr capsule TAKE 1 CAPSULE BY MOUTH EVERY DAY 90 capsule 0   guaiFENesin (MUCINEX) 600 MG 12 hr tablet Take by mouth 2 (two) times daily.     losartan (COZAAR) 25 MG tablet Take 0.5 tablets (12.5 mg total) by mouth daily. Please keep your upcoming appointment for future refills. Thank you! 45 tablet 0   LUMIGAN 0.01 % SOLN      Multiple Vitamins-Minerals (MULTIVITAMIN & MINERAL PO) Take 1 tablet  by mouth daily.     nitroGLYCERIN (NITROSTAT) 0.4 MG SL tablet Place 1 tablet (0.4 mg total) under the tongue every 5 (five) minutes as needed for chest pain. Please call [phone] to make appointment with Dr. Tenny Craw for future refills. 25 tablet 0   Nutritional Supplements (VITAMIN D BOOSTER PO) Take by mouth.     Respiratory Therapy Supplies (FLUTTER) DEVI 1 application by Does not apply route 3 (three) times daily. 1 each 0   Saccharomyces boulardii (FLORASTOR PO) Take 1 capsule by mouth daily.     SODIUM FLUORIDE 5000 SENSITIVE 1.1-5 % GEL Take by mouth as directed.     Tiotropium Bromide-Olodaterol (STIOLTO RESPIMAT) 2.5-2.5 MCG/ACT AERS Inhale 2 puffs into the lungs daily. 12 g 1   Zinc 30 MG CAPS      nitrofurantoin, macrocrystal-monohydrate, (MACROBID) 100 MG capsule Take 1 capsule (100 mg total) by mouth 2 (two) times daily. (Patient not taking: Reported on 12/15/2022) 14 capsule 0   No current facility-administered medications for this visit.    Allergies:   Patient has no known allergies.    Social History:  The patient  reports that she quit smoking about 55 years ago. Her smoking use included cigarettes. She has never used smokeless tobacco. She reports current alcohol use of about 1.0 standard drink of alcohol per week. She reports that she does not use drugs.   Family History:  The patient's family history includes Brain cancer in her maternal uncle; Colon cancer (age of onset: 37) in her father; Colon polyps in her daughter; Heart attack in her mother; Hypertension in her brother; Melanoma (age of onset: 47) in her niece; Melanoma (age of onset: 80) in her daughter; Other (age of onset: 2) in her paternal uncle; Stroke in her maternal grandmother; Uterine cancer (age of onset: 69) in her daughter.    ROS:  Please see the history of present illness.  Otherwise, review of systems are positive for none. All other systems are reviewed and negative.    PHYSICAL EXAM: VS:  BP (!)  164/68   Pulse 88   Ht 5\' 3"  (1.6 m)   Wt 130 lb 3.2 oz (59.1 kg)   LMP 02/23/1985   SpO2 96%   BMI 23.06 kg/m  , BMI Body mass index is 23.06 kg/m.  Thin elderly female in NAD  HEENT: normal Neck JVP normal  No bruits Lungs clear to auscultation Heart:  RRR  Normal S1/S2 no murmur,    Abdomen: benign,  no masses  No hepatomegaly   Ext  no LE edema    2+  pulses       EKG:  EKG SR   88 bpm  Anteroseptal MI   Echo   Aug 2023  Left ventricular ejection fraction, by estimation, is 60 to 65%. The left ventricle has normal function. The left ventricle has no regional wall motion abnormalities. Left ventricular diastolic parameters were normal. 1. Right ventricular systolic function is normal. The right ventricular size is normal. There is normal pulmonary artery systolic pressure. The estimated right ventricular systolic pressure is 28.4 mmHg. 2. 3. Right atrial size was mildly dilated. The mitral valve is normal in structure. Trivial mitral valve regurgitation. No evidence of mitral stenosis. 4. The aortic valve is tricuspid. There is mild calcification of the aortic valve. Aortic valve regurgitation is not visualized. Aortic valve sclerosis is present, with no evidence of aortic valve stenosis. 5. The inferior vena cava is normal in size with greater than 50% respiratory variability, suggesting right atrial pressure of 3 mmHg.   CT coronary angiogram  IMPRESSION: 1. Chronic extensive cylindrical and varicoid bronchiectasis throughout the visualized lungs with associated bandlike regions of scarring, patchy tree-in-bud opacities and scattered foci of mucoid impaction, not appreciably changed since 12/06/2020 chest CT. Findings are most compatible with chronic atypical mycobacterial infection (MAI). 2. Disproportionately dilated right pulmonary artery, cannot exclude pulmonary arterial hypertension. 3. Aortic Atherosclerosis (ICD10-I70.0).  Cardiac  IMPRESSION: 1.  Coronary calcium score of 323. Percentile not available for age>84   2. Normal coronary origin with right dominance.   3. Nonobstructive CAD   4. Mixed plaque in proximal LAD causes mild (25-49%) stenosis   5. Mixed plaque in proximal LCX causes mild (25-49%) stenosis   6. Mixed plaque in proximal RCA causes minimal (0-24%) stenosis.   7. Right pulmonary artery aneurysm measuring 44mm      Recent Labs: 03/24/2022: TSH 3.24 10/22/2022: ALT 17; BUN 14; Creatinine, Ser 0.63; Hemoglobin 12.5; Platelets 272.0; Potassium 4.0; Sodium 129    Lipid Panel    Component Value Date/Time   CHOL 162 03/24/2022 1003   TRIG 65.0 03/24/2022 1003   HDL 62.60 03/24/2022 1003   CHOLHDL 3 03/24/2022 1003   VLDL 13.0 03/24/2022 1003   LDLCALC 87 03/24/2022 1003     Wt Readings from Last 3 Encounters:  12/15/22 130 lb 3.2 oz (59.1 kg)  10/22/22 128 lb (58.1 kg)  10/12/22 128 lb 9.6 oz (58.3 kg)    Other studies Reviewed: Additional studies/ records that were reviewed today include: . Review of the above records demonstrates:   Lower extremity doppler studies 01/10/20:   Right: Resting right ankle-brachial index is within normal range. No  evidence of significant right lower extremity arterial disease. The right  toe-brachial index is normal.   Left: Resting left ankle-brachial index is within normal range. No  evidence of significant left lower extremity arterial disease. The left  toe-brachial index is normal.  ASSESSMENT AND PLAN:  1  CAD  Mild to moderate CAD on CT scan   I am not convinced the band like sensation is angina   May be more bra/musculoskeletal   I have encouraged her to stop wearing bra, follow   2  Syncope  no recurrence    2  Rhythm  Zio patch last year showed SR with frequent PACs,   Short burst SVT (15 min)  No afib   Follow      3.  HTN:  BP is high on arrival  On my check it is 146/88   BP readings are 100s to 150s athome   I have encouraged her to follow more  frequently  Goal 120s to 130s    6  HL Will check lipomed, Apo B, Lpa.  She is on Crestor 5 mg     7  Leg  "heaviness"   She has good distal pulses    ? Related to DJD of back    Consider reevaluation in [facility]    Stay hydrated   Stay active      Follow up next spring   Current medicines are reviewed at length with the patient today.  The patient does not have concerns regarding medicines.  Labs/ tests ordered today include: None  Orders Placed This Encounter  Procedures   EKG 12-Lead    Disposition:   FU in a year   Signed, Dietrich Pates, MD  12/15/2022 10:06 AM    [facility] [address] [place], [place], [place]  [address] Phone: [phone]; Fax: [phone]

## 2022-12-16 LAB — LIPOPROTEIN A (LPA): Lipoprotein (a): 91.6 nmol/L — ABNORMAL HIGH (ref <75.0)

## 2022-12-16 LAB — NMR, LIPOPROFILE
Cholesterol, Total: 188 mg/dL (ref 100–199)
HDL Particle Number: 32.9 umol/L (ref >=30.5)
HDL-C: 71 mg/dL (ref >39)
LDL Particle Number: 1116 nmol/L — ABNORMAL HIGH (ref <1000)
LDL Size: 21.5 nm (ref >20.5)
LDL-C (NIH Calc): 105 mg/dL — ABNORMAL HIGH (ref 0–99)
LP-IR Score: 34 (ref <=45)
Small LDL Particle Number: <90 nmol/L (ref <=527)
Triglycerides: 62 mg/dL (ref 0–149)

## 2022-12-16 LAB — HEMOGLOBIN A1C
Est. average glucose Bld gHb Est-mCnc: 123 mg/dL
Hgb A1c MFr Bld: 5.9 % — ABNORMAL HIGH (ref 4.8–5.6)

## 2022-12-16 LAB — APOLIPOPROTEIN B: Apolipoprotein B: 84 mg/dL (ref <90)

## 2022-12-17 ENCOUNTER — Telehealth: Payer: Self-pay | Admitting: Internal Medicine

## 2022-12-17 NOTE — Telephone Encounter (Signed)
Patient is returning call for results. Please advise

## 2022-12-17 NOTE — Telephone Encounter (Signed)
Spoke to pt.  See result note. 

## 2022-12-18 ENCOUNTER — Other Ambulatory Visit: Payer: Self-pay

## 2022-12-18 DIAGNOSIS — Z79899 Other long term (current) drug therapy: Secondary | ICD-10-CM

## 2022-12-18 DIAGNOSIS — E785 Hyperlipidemia, unspecified: Secondary | ICD-10-CM

## 2022-12-18 MED ORDER — ROSUVASTATIN CALCIUM 5 MG PO TABS: 5.0000 mg | ORAL_TABLET | Freq: Every day | ORAL | 3 refills | Status: DC

## 2022-12-25 ENCOUNTER — Encounter: Payer: Self-pay | Admitting: Internal Medicine

## 2023-01-08 ENCOUNTER — Encounter: Payer: Self-pay | Admitting: Internal Medicine

## 2023-01-08 ENCOUNTER — Ambulatory Visit (INDEPENDENT_AMBULATORY_CARE_PROVIDER_SITE_OTHER): Payer: Medicare PPO | Admitting: Internal Medicine

## 2023-01-08 ENCOUNTER — Ambulatory Visit (INDEPENDENT_AMBULATORY_CARE_PROVIDER_SITE_OTHER): Payer: Medicare PPO

## 2023-01-08 VITALS — BP 122/64 | HR 88 | Temp 98.7°F | Ht 63.0 in | Wt 128.0 lb

## 2023-01-08 DIAGNOSIS — R739 Hyperglycemia, unspecified: Secondary | ICD-10-CM

## 2023-01-08 DIAGNOSIS — J449 Chronic obstructive pulmonary disease, unspecified: Secondary | ICD-10-CM | POA: Diagnosis not present

## 2023-01-08 DIAGNOSIS — R058 Other specified cough: Secondary | ICD-10-CM

## 2023-01-08 DIAGNOSIS — R918 Other nonspecific abnormal finding of lung field: Secondary | ICD-10-CM | POA: Diagnosis not present

## 2023-01-08 DIAGNOSIS — R059 Cough, unspecified: Secondary | ICD-10-CM | POA: Diagnosis not present

## 2023-01-08 DIAGNOSIS — I10 Essential (primary) hypertension: Secondary | ICD-10-CM | POA: Diagnosis not present

## 2023-01-08 DIAGNOSIS — J984 Other disorders of lung: Secondary | ICD-10-CM | POA: Diagnosis not present

## 2023-01-08 DIAGNOSIS — J479 Bronchiectasis, uncomplicated: Secondary | ICD-10-CM | POA: Diagnosis not present

## 2023-01-08 MED ORDER — HYDROCODONE BIT-HOMATROP MBR 5-1.5 MG/5ML PO SOLN: 5.0000 mL | Freq: Four times a day (QID) | ORAL | 0 refills | Status: DC | PRN

## 2023-01-08 MED ORDER — AZITHROMYCIN 250 MG PO TABS: ORAL_TABLET | ORAL | 1 refills | Status: AC

## 2023-01-08 NOTE — Progress Notes (Unsigned)
Patient ID: Miranda Thompson, female   DOB: 1933/04/12, 89 y.o.   MRN: [id number]        Chief Complaint: follow up prod cough, copd, hyperglycemia, htn       HPI:  Miranda Thompson is a 89 y.o. female Here with acute onset mild to mod 2-3 days ST, HA, general weakness and malaise, with prod cough greenish sputum, but Pt denies chest pain, increased sob or doe, wheezing, orthopnea, PND, increased LE swelling, palpitations, dizziness or syncope.   Pt denies polydipsia, polyuria, or new focal neuro s/s.    Pt denies recent wt loss, night sweats, loss of appetite, or other constitutional symptoms         Wt Readings from Last 3 Encounters:  01/08/23 128 lb (58.1 kg)  12/15/22 130 lb 3.2 oz (59.1 kg)  10/22/22 128 lb (58.1 kg)   BP Readings from Last 3 Encounters:  01/08/23 122/64  12/15/22 (!) 164/68  10/22/22 124/72         Past Medical History:  Diagnosis Date   Adenomatous colon polyp    Allergic rhinitis    Anemia    pt states no anemia in her past hx thst she is aware of    Atrial fibrillation (HCC)    goes in and out of this rhythm- takes Tenormin   Breast cancer, left breast (HCC) 1994 with lumpectomy   chemo,tamox,radiation   Bronchiectasis with acute exacerbation (HCC)    COPD (chronic obstructive pulmonary disease) (HCC)    Diverticulosis of colon    DJD (degenerative joint disease)    Dysrhythmia    palpitations   Family history of brain cancer    Family history of colon cancer 02/14/2018   Family history of colon cancer    Family history of malignant neoplasm of gastrointestinal tract    Family history of melanoma    Family history of uterine cancer    Fibromyalgia    GERD (gastroesophageal reflux disease) 02/14/2018   Glaucoma    Hemoptysis    History of pneumonia    Hypercholesteremia    pt denies   Hypertension    Low back pain syndrome    Neuromuscular disorder (HCC)    hx fibromyalgia    Osteopenia    Pneumonia    hx   Pseudomonas infection     Rotator cuff syndrome of right shoulder    Spinal stenosis    Past Surgical History:  Procedure Laterality Date   CATARACT EXTRACTION Right    COLONOSCOPY     left breast lumpectomy and LN dissection  06/1992   Dr. Ezzard Standing   LUNG BIOPSY  1968   TSurg---Dr. Bascom Levels   LUNG BIOPSY  2012   dr Kriste Basque   POLYPECTOMY     REVERSE SHOULDER ARTHROPLASTY Right 08/02/2015   Procedure: RIGHT REVERSE TOTAL SHOULDER ARTHROPLASTY;  Surgeon: Beverely Low, MD;  Location: [facility] OR;  Service: Orthopedics;  Laterality: Right;   REVERSE TOTAL SHOULDER ARTHROPLASTY Right 08/02/2015   TONSILLECTOMY     TOTAL KNEE ARTHROPLASTY Right 01/21/2015   Procedure: TOTAL RIGHT KNEE ARTHROPLASTY;  Surgeon: Durene Romans, MD;  Location: [facility] ORS;  Service: Orthopedics;  Laterality: Right;   TOTAL KNEE ARTHROPLASTY Left 03/23/2016   Procedure: LEFT TOTAL KNEE ARTHROPLASTY;  Surgeon: Durene Romans, MD;  Location: [facility] OR[facility];  Service: Orthopedics;  Laterality: Left;    reports that she quit smoking about 55 years ago. Her smoking use included cigarettes. She has never used smokeless tobacco. She reports  current alcohol use of about 1.0 standard drink of alcohol per week. She reports that she does not use drugs. family history includes Brain cancer in her maternal uncle; Colon cancer (age of onset: 82) in her father; Colon polyps in her daughter; Heart attack in her mother; Hypertension in her brother; Melanoma (age of onset: 16) in her niece; Melanoma (age of onset: 79) in her daughter; Other (age of onset: 10) in her paternal uncle; Stroke in her maternal grandmother; Uterine cancer (age of onset: 45) in her daughter. No Known Allergies Current Outpatient Medications on File Prior to Visit  Medication Sig Dispense Refill   albuterol (VENTOLIN HFA) 108 (90 Base) MCG/ACT inhaler TAKE 2 PUFFS BY MOUTH EVERY 6 HOURS AS NEEDED FOR WHEEZE OR SHORTNESS OF BREATH 8.5 each 6   ASPIRIN 81 PO Take 1 tablet by mouth daily.     Calcium  Carb-Cholecalciferol 600-800 MG-UNIT TABS Take 1 tablet by mouth daily.     diltiazem (CARDIZEM CD) 120 MG 24 hr capsule TAKE 1 CAPSULE BY MOUTH EVERY DAY 90 capsule 0   guaiFENesin (MUCINEX) 600 MG 12 hr tablet Take by mouth 2 (two) times daily.     losartan (COZAAR) 25 MG tablet Take 0.5 tablets (12.5 mg total) by mouth daily. Please keep your upcoming appointment for future refills. Thank you! 45 tablet 0   LUMIGAN 0.01 % SOLN      Multiple Vitamins-Minerals (MULTIVITAMIN & MINERAL PO) Take 1 tablet by mouth daily.     nitroGLYCERIN (NITROSTAT) 0.4 MG SL tablet Place 1 tablet (0.4 mg total) under the tongue every 5 (five) minutes as needed for chest pain. Please call [phone] to make appointment with Dr. Tenny Craw for future refills. 25 tablet 0   Nutritional Supplements (VITAMIN D BOOSTER PO) Take by mouth.     Respiratory Therapy Supplies (FLUTTER) DEVI 1 application by Does not apply route 3 (three) times daily. 1 each 0   rosuvastatin (CRESTOR) 5 MG tablet Take 1 tablet (5 mg total) by mouth daily. 90 tablet 3   Saccharomyces boulardii (FLORASTOR PO) Take 1 capsule by mouth daily.     SODIUM FLUORIDE 5000 SENSITIVE 1.1-5 % GEL Take by mouth as directed.     Tiotropium Bromide-Olodaterol (STIOLTO RESPIMAT) 2.5-2.5 MCG/ACT AERS Inhale 2 puffs into the lungs daily. 12 g 1   Zinc 30 MG CAPS      nitrofurantoin, macrocrystal-monohydrate, (MACROBID) 100 MG capsule Take 1 capsule (100 mg total) by mouth 2 (two) times daily. (Patient not taking: Reported on 12/15/2022) 14 capsule 0   No current facility-administered medications on file prior to visit.        ROS:  All others reviewed and negative.  Objective        PE:  BP 122/64 (BP Location: Right Arm, Patient Position: Sitting, Cuff Size: Normal)   Pulse 88   Temp 98.7 F (37.1 C) (Oral)   Ht 5\' 3"  (1.6 m)   Wt 128 lb (58.1 kg)   LMP 02/23/1985   SpO2 98%   BMI 22.67 kg/m                 Constitutional: Pt appears in NAD                HENT: Head: NCAT.                Right Ear: External ear normal.  Left Ear: External ear normal.                Eyes: . Pupils are equal, round, and reactive to light. Conjunctivae and EOM are normal               Nose: without d/c or deformity               Neck: Neck supple. Gross normal ROM               Cardiovascular: Normal rate and regular rhythm.                 Pulmonary/Chest: Effort normal and breath sounds without rales or wheezing.                Abd:  Soft, NT, ND, + BS, no organomegaly               Neurological: Pt is alert. At baseline orientation, motor grossly intact               Skin: Skin is warm. No rashes, no other new lesions, LE edema - none               Psychiatric: Pt behavior is normal without agitation   Micro: none  Cardiac tracings I have personally interpreted today:  none  Pertinent Radiological findings (summarize): none   Lab Results  Component Value Date   WBC 6.7 10/22/2022   HGB 12.5 10/22/2022   HCT 37.2 10/22/2022   PLT 272.0 10/22/2022   GLUCOSE 88 10/22/2022   CHOL 162 03/24/2022   TRIG 65.0 03/24/2022   HDL 62.60 03/24/2022   LDLCALC 87 03/24/2022   ALT 17 10/22/2022   AST 28 10/22/2022   NA 129 (L) 10/22/2022   K 4.0 10/22/2022   CL 94 (L) 10/22/2022   CREATININE 0.63 10/22/2022   BUN 14 10/22/2022   CO2 29 10/22/2022   TSH 3.24 03/24/2022   INR 1.06 01/14/2015   HGBA1C 5.9 (H) 12/15/2022   MICROALBUR <0.7 03/24/2022   Assessment/Plan:  Miranda Thompson is a 89 y.o. White or Caucasian [1] female with  has a past medical history of Adenomatous colon polyp, Allergic rhinitis, Anemia, Atrial fibrillation (HCC), Breast cancer, left breast (HCC) (1994 with lumpectomy), Bronchiectasis with acute exacerbation (HCC), COPD (chronic obstructive pulmonary disease) (HCC), Diverticulosis of colon, DJD (degenerative joint disease), Dysrhythmia, Family history of brain cancer, Family history of colon cancer (02/14/2018),  Family history of colon cancer, Family history of malignant neoplasm of gastrointestinal tract, Family history of melanoma, Family history of uterine cancer, Fibromyalgia, GERD (gastroesophageal reflux disease) (02/14/2018), Glaucoma, Hemoptysis, History of pneumonia, Hypercholesteremia, Hypertension, Low back pain syndrome, Neuromuscular disorder (HCC), Osteopenia, Pneumonia, Pseudomonas infection, Rotator cuff syndrome of right shoulder, and Spinal stenosis.  COPD, moderate (HCC) Stable, cont inhaler prn  Productive cough Mild to mod, for antibx course zpack, hycodan prn, for cxr,  to f/u any worsening symptoms or concerns  Hyperglycemia Lab Results  Component Value Date   HGBA1C 5.9 (H) 12/15/2022   Stable, pt to continue current medical treatment  - diet, wt control   Episode of hypertension BP Readings from Last 3 Encounters:  01/08/23 122/64  12/15/22 (!) 164/68  10/22/22 124/72   Stable, pt to continue medical treatment card cd 120 every day, losartan 12.5 every day   Followup: Return if symptoms worsen or fail to improve.  Oliver Barre, MD 01/10/2023 10:58 AM [facility]

## 2023-01-08 NOTE — Patient Instructions (Signed)
Please take all new medication as prescribed - the antibiotic, and cough medicine as needed  Please continue all other medications as before, and refills have been done if requested.  Please have the pharmacy call with any other refills you may need.  Please keep your appointments with your specialists as you may have planned  Please go to the XRAY Department in the first floor for the x-ray testing  You will be contacted by phone if any changes need to be made immediately.  Otherwise, you will receive a letter about your results with an explanation, but please check with MyChart first.

## 2023-01-10 ENCOUNTER — Encounter: Payer: Self-pay | Admitting: Internal Medicine

## 2023-01-10 DIAGNOSIS — R058 Other specified cough: Secondary | ICD-10-CM | POA: Insufficient documentation

## 2023-01-10 NOTE — Assessment & Plan Note (Addendum)
Mild to mod, for antibx course zpack, hycodan prn, for cxr,  to f/u any worsening symptoms or concerns

## 2023-01-10 NOTE — Assessment & Plan Note (Signed)
BP Readings from Last 3 Encounters:  01/08/23 122/64  12/15/22 (!) 164/68  10/22/22 124/72   Stable, pt to continue medical treatment card cd 120 every day, losartan 12.5 every day

## 2023-01-10 NOTE — Assessment & Plan Note (Signed)
Lab Results  Component Value Date   HGBA1C 5.9 (H) 12/15/2022   Stable, pt to continue current medical treatment  - diet, wt control

## 2023-01-10 NOTE — Assessment & Plan Note (Signed)
Stable, cont inhaler prn 

## 2023-01-14 ENCOUNTER — Telehealth: Payer: Self-pay | Admitting: Pulmonary Disease

## 2023-01-14 ENCOUNTER — Ambulatory Visit: Payer: Medicare PPO

## 2023-01-14 ENCOUNTER — Ambulatory Visit: Payer: Medicare PPO | Admitting: Internal Medicine

## 2023-01-14 ENCOUNTER — Encounter: Payer: Self-pay | Admitting: Internal Medicine

## 2023-01-14 VITALS — BP 126/62 | HR 72 | Temp 97.9°F | Ht 63.0 in | Wt 130.5 lb

## 2023-01-14 DIAGNOSIS — R739 Hyperglycemia, unspecified: Secondary | ICD-10-CM | POA: Diagnosis not present

## 2023-01-14 DIAGNOSIS — R062 Wheezing: Secondary | ICD-10-CM | POA: Diagnosis not present

## 2023-01-14 DIAGNOSIS — J471 Bronchiectasis with (acute) exacerbation: Secondary | ICD-10-CM

## 2023-01-14 DIAGNOSIS — R079 Chest pain, unspecified: Secondary | ICD-10-CM | POA: Diagnosis not present

## 2023-01-14 DIAGNOSIS — I10 Essential (primary) hypertension: Secondary | ICD-10-CM | POA: Diagnosis not present

## 2023-01-14 DIAGNOSIS — R058 Other specified cough: Secondary | ICD-10-CM | POA: Diagnosis not present

## 2023-01-14 MED ORDER — METHYLPREDNISOLONE ACETATE 80 MG/ML IJ SUSP
80.0000 mg | Freq: Once | INTRAMUSCULAR | Status: AC
  Administered 2023-01-14: 80 mg via INTRAMUSCULAR

## 2023-01-14 MED ORDER — CEFTRIAXONE SODIUM 1 G IJ SOLR
1.0000 g | Freq: Once | INTRAMUSCULAR | Status: AC
  Administered 2023-01-14: 1 g via INTRAMUSCULAR

## 2023-01-14 MED ORDER — LEVOFLOXACIN 500 MG PO TABS: 500.0000 mg | ORAL_TABLET | Freq: Every day | ORAL | 0 refills | Status: DC

## 2023-01-14 MED ORDER — PREDNISONE 10 MG PO TABS: ORAL_TABLET | ORAL | 0 refills | Status: DC

## 2023-01-14 MED ORDER — HYDROCODONE BIT-HOMATROP MBR 5-1.5 MG/5ML PO SOLN: 5.0000 mL | Freq: Four times a day (QID) | ORAL | 0 refills | Status: AC | PRN

## 2023-01-14 NOTE — Telephone Encounter (Signed)
Patient has congestion, a cough and temperature . Patient has been on a Zpack for 5 days  from her primary care provider. Patient has COPD and has trouble breathing including wheezing. Zpack did not help since she still has these symptoms.No acute appointment available.  Please call and advise. [phone]

## 2023-01-14 NOTE — Assessment & Plan Note (Addendum)
With worsening s/s flare - for recephin 1 gm, levaquin 500 every day, cough med prn, and cxr repeat

## 2023-01-14 NOTE — Assessment & Plan Note (Signed)
Lab Results  Component Value Date   HGBA1C 5.9 (H) 12/15/2022   Stable, pt to continue current medical treatment  - diet, wt control

## 2023-01-14 NOTE — Assessment & Plan Note (Signed)
Mild to mod, for depomedrol 80 mg IM, prednisone taper,  to f/u any worsening symptoms or concerns

## 2023-01-14 NOTE — Patient Instructions (Addendum)
You had the antibiotic, and steroid shots today  Please take all new medication as prescribed - the levaquin antibiotic, cough medicine, and prednisone  Please continue all other medications as before, including the inhaler  Please have the pharmacy call with any other refills you may need..  Please keep your appointments with your specialists as you may have planned  Please go to the XRAY Department in the first floor for the x-ray testing  You will be contacted by phone if any changes need to be made immediately.  Otherwise, you will receive a letter about your results with an explanation, but please check with MyChart first.

## 2023-01-14 NOTE — Assessment & Plan Note (Signed)
BP Readings from Last 3 Encounters:  01/14/23 126/62  01/08/23 122/64  12/15/22 (!) 164/68   Stable, pt to continue medical treatment card cd 120 every day, losartan 12.5 qd

## 2023-01-14 NOTE — Progress Notes (Signed)
Patient ID: NYKIRA FLANERY, female   DOB: 10-Sep-1933, 89 y.o.   MRN: [id number]        Chief Complaint: follow up worsening prod cough and congestion, wheeziness, hyperglycemia, htn       HPI:  Miranda Thompson is a 89 y.o. female here after seen 6 days ago with resp symptoms unfortuantely not improved with zpack, though cough med has worked ok for cough even with sleep.  Here with worsening acute onset now moderate ST, HA, general weakness and malaise, with increased prod cough greenish sputum with sob and wheeziness,, but Pt denies orthopnea, PND, increased LE swelling, palpitations, dizziness or syncope.   Pt denies polydipsia, polyuria, or new focal neuro s/s.    Pt recent wt loss, night sweats, loss of appetite, or other constitutional symptoms  Wt Readings from Last 3 Encounters:  01/14/23 130 lb 8 oz (59.2 kg)  01/08/23 128 lb (58.1 kg)  12/15/22 130 lb 3.2 oz (59.1 kg)   BP Readings from Last 3 Encounters:  01/14/23 126/62  01/08/23 122/64  12/15/22 (!) 164/68         Past Medical History:  Diagnosis Date   Adenomatous colon polyp    Allergic rhinitis    Anemia    pt states no anemia in her past hx thst she is aware of    Atrial fibrillation (HCC)    goes in and out of this rhythm- takes Tenormin   Breast cancer, left breast (HCC) 1994 with lumpectomy   chemo,tamox,radiation   Bronchiectasis with acute exacerbation (HCC)    COPD (chronic obstructive pulmonary disease) (HCC)    Diverticulosis of colon    DJD (degenerative joint disease)    Dysrhythmia    palpitations   Family history of brain cancer    Family history of colon cancer 02/14/2018   Family history of colon cancer    Family history of malignant neoplasm of gastrointestinal tract    Family history of melanoma    Family history of uterine cancer    Fibromyalgia    GERD (gastroesophageal reflux disease) 02/14/2018   Glaucoma    Hemoptysis    History of pneumonia    Hypercholesteremia    pt denies    Hypertension    Low back pain syndrome    Neuromuscular disorder (HCC)    hx fibromyalgia    Osteopenia    Pneumonia    hx   Pseudomonas infection    Rotator cuff syndrome of right shoulder    Spinal stenosis    Past Surgical History:  Procedure Laterality Date   CATARACT EXTRACTION Right    COLONOSCOPY     left breast lumpectomy and LN dissection  06/1992   Dr. Ezzard Standing   LUNG BIOPSY  1968   TSurg---Dr. Bascom Levels   LUNG BIOPSY  2012   dr Kriste Basque   POLYPECTOMY     REVERSE SHOULDER ARTHROPLASTY Right 08/02/2015   Procedure: RIGHT REVERSE TOTAL SHOULDER ARTHROPLASTY;  Surgeon: Beverely Low, MD;  Location: [facility] OR;  Service: Orthopedics;  Laterality: Right;   REVERSE TOTAL SHOULDER ARTHROPLASTY Right 08/02/2015   TONSILLECTOMY     TOTAL KNEE ARTHROPLASTY Right 01/21/2015   Procedure: TOTAL RIGHT KNEE ARTHROPLASTY;  Surgeon: Durene Romans, MD;  Location: [facility] ORS;  Service: Orthopedics;  Laterality: Right;   TOTAL KNEE ARTHROPLASTY Left 03/23/2016   Procedure: LEFT TOTAL KNEE ARTHROPLASTY;  Surgeon: Durene Romans, MD;  Location: [facility] ORS;  Service: Orthopedics;  Laterality: Left;    reports that she  quit smoking about 55 years ago. Her smoking use included cigarettes. She has never used smokeless tobacco. She reports current alcohol use of about 1.0 standard drink of alcohol per week. She reports that she does not use drugs. family history includes Brain cancer in her maternal uncle; Colon cancer (age of onset: 57) in her father; Colon polyps in her daughter; Heart attack in her mother; Hypertension in her brother; Melanoma (age of onset: 24) in her niece; Melanoma (age of onset: 80) in her daughter; Other (age of onset: 42) in her paternal uncle; Stroke in her maternal grandmother; Uterine cancer (age of onset: 45) in her daughter. No Known Allergies Current Outpatient Medications on File Prior to Visit  Medication Sig Dispense Refill   albuterol (VENTOLIN HFA) 108 (90 Base) MCG/ACT inhaler TAKE 2  PUFFS BY MOUTH EVERY 6 HOURS AS NEEDED FOR WHEEZE OR SHORTNESS OF BREATH 8.5 each 6   ASPIRIN 81 PO Take 1 tablet by mouth daily.     Calcium Carb-Cholecalciferol 600-800 MG-UNIT TABS Take 1 tablet by mouth daily.     diltiazem (CARDIZEM CD) 120 MG 24 hr capsule TAKE 1 CAPSULE BY MOUTH EVERY DAY 90 capsule 0   guaiFENesin (MUCINEX) 600 MG 12 hr tablet Take by mouth 2 (two) times daily.     losartan (COZAAR) 25 MG tablet Take 0.5 tablets (12.5 mg total) by mouth daily. Please keep your upcoming appointment for future refills. Thank you! 45 tablet 0   LUMIGAN 0.01 % SOLN      Multiple Vitamins-Minerals (MULTIVITAMIN & MINERAL PO) Take 1 tablet by mouth daily.     nitrofurantoin, macrocrystal-monohydrate, (MACROBID) 100 MG capsule Take 1 capsule (100 mg total) by mouth 2 (two) times daily. 14 capsule 0   nitroGLYCERIN (NITROSTAT) 0.4 MG SL tablet Place 1 tablet (0.4 mg total) under the tongue every 5 (five) minutes as needed for chest pain. Please call [phone] to make appointment with Dr. Tenny Craw for future refills. 25 tablet 0   Nutritional Supplements (VITAMIN D BOOSTER PO) Take by mouth.     Respiratory Therapy Supplies (FLUTTER) DEVI 1 application by Does not apply route 3 (three) times daily. 1 each 0   rosuvastatin (CRESTOR) 5 MG tablet Take 1 tablet (5 mg total) by mouth daily. 90 tablet 3   Saccharomyces boulardii (FLORASTOR PO) Take 1 capsule by mouth daily.     SODIUM FLUORIDE 5000 SENSITIVE 1.1-5 % GEL Take by mouth as directed.     Tiotropium Bromide-Olodaterol (STIOLTO RESPIMAT) 2.5-2.5 MCG/ACT AERS Inhale 2 puffs into the lungs daily. 12 g 1   Zinc 30 MG CAPS      No current facility-administered medications on file prior to visit.        ROS:  All others reviewed and negative.  Objective        PE:  BP 126/62   Pulse 72   Temp 97.9 F (36.6 C) (Temporal)   Ht 5\' 3"  (1.6 m)   Wt 130 lb 8 oz (59.2 kg)   LMP 02/23/1985   SpO2 94%   BMI 23.12 kg/m                  Constitutional: Pt appears mild ill               HENT: Head: NCAT.                Right Ear: External ear normal.  Left Ear: External ear normal.                Eyes: . Pupils are equal, round, and reactive to light. Conjunctivae and EOM are normal               Nose: without d/c or deformity               Neck: Neck supple. Gross normal ROM               Cardiovascular: Normal rate and regular rhythm.                 Pulmonary/Chest: Effort normal and breath sounds decreased  without rales  wheezing.                Abd:  Soft, NT, ND, + BS, no organomegaly               Neurological: Pt is alert. At baseline orientation, motor grossly intact               Skin: Skin is warm. No rashes, no other new lesions, LE edema - none               Psychiatric: Pt behavior is normal without agitation   Micro: none  Cardiac tracings I have personally interpreted today:  none  Pertinent Radiological findings (summarize): none   Lab Results  Component Value Date   WBC 6.7 10/22/2022   HGB 12.5 10/22/2022   HCT 37.2 10/22/2022   PLT 272.0 10/22/2022   GLUCOSE 88 10/22/2022   CHOL 162 03/24/2022   TRIG 65.0 03/24/2022   HDL 62.60 03/24/2022   LDLCALC 87 03/24/2022   ALT 17 10/22/2022   AST 28 10/22/2022   NA 129 (L) 10/22/2022   K 4.0 10/22/2022   CL 94 (L) 10/22/2022   CREATININE 0.63 10/22/2022   BUN 14 10/22/2022   CO2 29 10/22/2022   TSH 3.24 03/24/2022   INR 1.06 01/14/2015   HGBA1C 5.9 (H) 12/15/2022   MICROALBUR <0.7 03/24/2022   Assessment/Plan:  MALANIE BIAGI is a 89 y.o. White or Caucasian [1] female with  has a past medical history of Adenomatous colon polyp, Allergic rhinitis, Anemia, Atrial fibrillation (HCC), Breast cancer, left breast (HCC) (1994 with lumpectomy), Bronchiectasis with acute exacerbation (HCC), COPD (chronic obstructive pulmonary disease) (HCC), Diverticulosis of colon, DJD (degenerative joint disease), Dysrhythmia, Family history of brain  cancer, Family history of colon cancer (02/14/2018), Family history of colon cancer, Family history of malignant neoplasm of gastrointestinal tract, Family history of melanoma, Family history of uterine cancer, Fibromyalgia, GERD (gastroesophageal reflux disease) (02/14/2018), Glaucoma, Hemoptysis, History of pneumonia, Hypercholesteremia, Hypertension, Low back pain syndrome, Neuromuscular disorder (HCC), Osteopenia, Pneumonia, Pseudomonas infection, Rotator cuff syndrome of right shoulder, and Spinal stenosis.  Hyperglycemia Lab Results  Component Value Date   HGBA1C 5.9 (H) 12/15/2022   Stable, pt to continue current medical treatment  - diet, wt control   Episode of hypertension BP Readings from Last 3 Encounters:  01/14/23 126/62  01/08/23 122/64  12/15/22 (!) 164/68   Stable, pt to continue medical treatment card cd 120 every day, losartan 12.5 qd   Bronchiectasis (HCC) With worsening s/s flare - for recephin 1 gm, levaquin 500 every day, cough med prn, and cxr repeat  Wheezing Mild to mod, for depomedrol 80 mg IM, prednisone taper,  to f/u any worsening symptoms or concerns  Followup: Return if symptoms worsen or fail to improve.  Oliver Barre, MD 01/14/2023 4:43 PM [facility] - [facility]

## 2023-01-14 NOTE — Telephone Encounter (Signed)
Spoke with patient's daughter French Ana regarding patient . French Ana stated patient has had a cough,wheezing SOB for about a 1 1/2 and patient seen PCP on 11/14. Patient was given a Zpack and also had a CXR. No available opening's for patient to be seen in our office .  Dr.Icard can you please advise  Thank you

## 2023-01-15 NOTE — Telephone Encounter (Signed)
Called and spoke with patient, she states she was able to get back in with her PCP and was given another antibiotic, a steroid shot and some prednisone.  She is feeling better than she was.  Advised that we do not currently have any openings, however, was glad to hear that she was able to see her PCP.  Nothing further needed.

## 2023-01-27 DIAGNOSIS — H2512 Age-related nuclear cataract, left eye: Secondary | ICD-10-CM | POA: Diagnosis not present

## 2023-01-27 DIAGNOSIS — H04123 Dry eye syndrome of bilateral lacrimal glands: Secondary | ICD-10-CM | POA: Diagnosis not present

## 2023-01-27 DIAGNOSIS — H401132 Primary open-angle glaucoma, bilateral, moderate stage: Secondary | ICD-10-CM | POA: Diagnosis not present

## 2023-02-03 ENCOUNTER — Encounter: Payer: Self-pay | Admitting: Internal Medicine

## 2023-02-09 ENCOUNTER — Other Ambulatory Visit: Payer: Self-pay | Admitting: Pulmonary Disease

## 2023-02-09 ENCOUNTER — Other Ambulatory Visit: Payer: Self-pay | Admitting: Cardiovascular Disease

## 2023-02-09 ENCOUNTER — Encounter: Payer: Self-pay | Admitting: Obstetrics and Gynecology

## 2023-02-09 ENCOUNTER — Ambulatory Visit (INDEPENDENT_AMBULATORY_CARE_PROVIDER_SITE_OTHER): Payer: Medicare PPO | Admitting: Obstetrics and Gynecology

## 2023-02-09 ENCOUNTER — Other Ambulatory Visit: Payer: Self-pay | Admitting: Internal Medicine

## 2023-02-09 VITALS — BP 110/62 | HR 89 | Ht 62.25 in | Wt 123.0 lb

## 2023-02-09 DIAGNOSIS — N9489 Other specified conditions associated with female genital organs and menstrual cycle: Secondary | ICD-10-CM

## 2023-02-09 DIAGNOSIS — N952 Postmenopausal atrophic vaginitis: Secondary | ICD-10-CM | POA: Diagnosis not present

## 2023-02-09 DIAGNOSIS — E2839 Other primary ovarian failure: Secondary | ICD-10-CM

## 2023-02-09 DIAGNOSIS — Z01419 Encounter for gynecological examination (general) (routine) without abnormal findings: Secondary | ICD-10-CM | POA: Diagnosis not present

## 2023-02-09 DIAGNOSIS — Z853 Personal history of malignant neoplasm of breast: Secondary | ICD-10-CM

## 2023-02-09 NOTE — Progress Notes (Signed)
89 y.o. y.o. female here for annual exam. Patient's last menstrual period was 02/23/1985.    H/O breast cancer.    FH uterine cancer in her daughter with genetic mutation in MSH6, patient is negative for this gene    Patient's last menstrual period was 02/23/1985.          Sexually active: No.  The current method of family planning is post menopausal status.    Exercising: Yes.    Walking weights and bike  Smoker:  no  Health Maintenance: Pap:   06/24/17 WNL History of abnormal Pap:  no MMG:  11/05/22 Bi-rads 2 benign  BMD:   11/05/22 T -1.7, 2022 -1.20, 2019 -1.3 .  Counseled on Evenity and or prolia.  Has reflux with a twisted esophagus and cannot take fosamax.  Information provided on both.  Colonoscopy: 08/08/2014 polyps, no f/u recommended  TDaP:  05/09/14  Gardasil: N/A  Hip ct scan with possible constipation but recommended PUS to evaluate  Body mass index is 22.32 kg/m.     01/08/2023    3:08 PM 10/22/2022    1:16 PM 03/27/2022    2:48 PM  Depression screen PHQ 2/9  Decreased Interest 0 0 0  Down, Depressed, Hopeless 0 0 0  PHQ - 2 Score 0 0 0    Blood pressure 110/62, pulse 89, height 5' 2.25" (1.581 m), weight 123 lb (55.8 kg), last menstrual period 02/23/1985, SpO2 97%.     Component Value Date/Time   DIAGPAP  06/24/2017 0000    NEGATIVE FOR INTRAEPITHELIAL LESIONS OR MALIGNANCY.   ADEQPAP  06/24/2017 0000    Satisfactory for evaluation  endocervical/transformation zone component PRESENT.    GYN HISTORY:    Component Value Date/Time   DIAGPAP  06/24/2017 0000    NEGATIVE FOR INTRAEPITHELIAL LESIONS OR MALIGNANCY.   ADEQPAP  06/24/2017 0000    Satisfactory for evaluation  endocervical/transformation zone component PRESENT.    OB History  Gravida Para Term Preterm AB Living  5 5 5   5   SAB IAB Ectopic Multiple Live Births      5    # Outcome Date GA Lbr Len/2nd Weight Sex Type Anes PTL Lv  5 Term     M Vag-Spont   LIV  4 Term     F Vag-Spont    LIV  3 Term     M Vag-Spont   LIV  2 Term     F    LIV  1 Term     F Vag-Spont   LIV    Past Medical History:  Diagnosis Date   Adenomatous colon polyp    Allergic rhinitis    Anemia    pt states no anemia in her past hx thst she is aware of    Atrial fibrillation (HCC)    goes in and out of this rhythm- takes Tenormin   Breast cancer, left breast (HCC) 1994 with lumpectomy   chemo,tamox,radiation   Bronchiectasis with acute exacerbation (HCC)    COPD (chronic obstructive pulmonary disease) (HCC)    Coronary artery disease    Diverticulosis of colon    DJD (degenerative joint disease)    Dysrhythmia    palpitations   Family history of brain cancer    Family history of colon cancer 02/14/2018   Family history of colon cancer    Family history of malignant neoplasm of gastrointestinal tract    Family history of melanoma    Family history of  uterine cancer    Fibromyalgia    GERD (gastroesophageal reflux disease) 02/14/2018   Glaucoma    Hemoptysis    History of pneumonia    Hypercholesteremia    pt denies   Hypertension    Low back pain syndrome    Neuromuscular disorder (HCC)    hx fibromyalgia    Osteopenia    Pneumonia    hx   Pseudomonas infection    Rotator cuff syndrome of right shoulder    Spinal stenosis     Past Surgical History:  Procedure Laterality Date   CATARACT EXTRACTION Right    COLONOSCOPY     left breast lumpectomy and LN dissection  06/1992   Dr. Ezzard Standing   LUNG BIOPSY  1968   TSurg---Dr. Bascom Levels   LUNG BIOPSY  2012   dr Kriste Basque   POLYPECTOMY     REVERSE SHOULDER ARTHROPLASTY Right 08/02/2015   Procedure: RIGHT REVERSE TOTAL SHOULDER ARTHROPLASTY;  Surgeon: Beverely Low, MD;  Location: [facility] OR;  Service: Orthopedics;  Laterality: Right;   REVERSE TOTAL SHOULDER ARTHROPLASTY Right 08/02/2015   TONSILLECTOMY     TOTAL KNEE ARTHROPLASTY Right 01/21/2015   Procedure: TOTAL RIGHT KNEE ARTHROPLASTY;  Surgeon: Durene Romans, MD;  Location: [facility] ORS;   Service: Orthopedics;  Laterality: Right;   TOTAL KNEE ARTHROPLASTY Left 03/23/2016   Procedure: LEFT TOTAL KNEE ARTHROPLASTY;  Surgeon: Durene Romans, MD;  Location: [facility] OR[facility];  Service: Orthopedics;  Laterality: Left;    Current Outpatient Medications on File Prior to Visit  Medication Sig Dispense Refill   ASPIRIN 81 PO Take 1 tablet by mouth daily.     Calcium Carb-Cholecalciferol 600-800 MG-UNIT TABS Take 1 tablet by mouth daily.     diltiazem (CARDIZEM CD) 120 MG 24 hr capsule TAKE 1 CAPSULE BY MOUTH EVERY DAY 90 capsule 0   guaiFENesin (MUCINEX) 600 MG 12 hr tablet Take by mouth 2 (two) times daily.     losartan (COZAAR) 25 MG tablet Take 0.5 tablets (12.5 mg total) by mouth daily. Please keep your upcoming appointment for future refills. Thank you! 45 tablet 0   LUMIGAN 0.01 % SOLN      Multiple Vitamins-Minerals (MULTIVITAMIN & MINERAL PO) Take 1 tablet by mouth daily.     rosuvastatin (CRESTOR) 5 MG tablet Take 1 tablet (5 mg total) by mouth daily. 90 tablet 3   Saccharomyces boulardii (FLORASTOR PO) Take 1 capsule by mouth daily.     SODIUM FLUORIDE 5000 SENSITIVE 1.1-5 % GEL Take by mouth as directed.     Tiotropium Bromide-Olodaterol (STIOLTO RESPIMAT) 2.5-2.5 MCG/ACT AERS Inhale 2 puffs into the lungs daily. 12 g 1   VITAMIN D PO Take 4,000 Int'l Units by mouth.     Zinc 30 MG CAPS      nitroGLYCERIN (NITROSTAT) 0.4 MG SL tablet Place 1 tablet (0.4 mg total) under the tongue every 5 (five) minutes as needed for chest pain. Please call [phone] to make appointment with Dr. Tenny Craw for future refills. (Patient not taking: Reported on 02/09/2023) 25 tablet 0   Respiratory Therapy Supplies (FLUTTER) DEVI 1 application by Does not apply route 3 (three) times daily. 1 each 0   No current facility-administered medications on file prior to visit.    Social History   Socioeconomic History   Marital status: Married    Spouse name: Not on file   Number of children: 5   Years of  education: Not on file   Highest education level: Not on file  Occupational History   Occupation: Retired  Tobacco Use   Smoking status: Former    Current packs/day: 0.00    Types: Cigarettes    Quit date: 02/24/1967    Years since quitting: 55.9   Smokeless tobacco: Never  Vaping Use   Vaping status: Never Used  Substance and Sexual Activity   Alcohol use: Not Currently    Comment: occasionally   Drug use: No   Sexual activity: Not Currently    Partners: Male    Comment: husband vasectomy, older than 16, less than 5  Other Topics Concern   Not on file  Social History Narrative   Not on file   Social Drivers of Health   Financial Resource Strain: Low Risk  (03/27/2022)   Overall Financial Resource Strain (CARDIA)    Difficulty of Paying Living Expenses: Not hard at all  Food Insecurity: No Food Insecurity (03/27/2022)   Hunger Vital Sign    Worried About Running Out of Food in the Last Year: Never true    Ran Out of Food in the Last Year: Never true  Transportation Needs: No Transportation Needs (03/27/2022)   PRAPARE - Administrator, Civil Service (Medical): No    Lack of Transportation (Non-Medical): No  Physical Activity: Sufficiently Active (03/27/2022)   Exercise Vital Sign    Days of Exercise per Week: 5 days    Minutes of Exercise per Session: 30 min  Stress: No Stress Concern Present (03/27/2022)   [employer] of Occupational Health - Occupational Stress Questionnaire    Feeling of Stress : Not at all  Social Connections: Socially Integrated (03/27/2022)   Social Connection and Isolation Panel [NHANES]    Frequency of Communication with Friends and Family: More than three times a week    Frequency of Social Gatherings with Friends and Family: Once a week    Attends Religious Services: 1 to 4 times per year    Active Member of [employer] or Organizations: Yes    Attends Banker Meetings: 1 to 4 times per year    Marital Status: Married  Careers information officer Violence: Not At Risk (03/27/2022)   Humiliation, Afraid, Rape, and Kick questionnaire    Fear of Current or Ex-Partner: No    Emotionally Abused: No    Physically Abused: No    Sexually Abused: No    Family History  Problem Relation Age of Onset   Heart attack Mother        Deceased age 30   Colon cancer Father 6       deceased age 33 from colon ca.   Hypertension Brother    Colon polyps Daughter    Uterine cancer Daughter 25   Brain cancer Maternal Uncle    Stroke Maternal Grandmother    Other Paternal Uncle 12       possible cancer   Melanoma Daughter 83   Melanoma Niece 57       d. 90   Esophageal cancer Neg Hx    Stomach cancer Neg Hx    Rectal cancer Neg Hx      No Known Allergies    Patient's last menstrual period was Patient's last menstrual period was 02/23/1985.Marland Kitchen            Review of Systems Alls systems reviewed and are negative.     Physical Exam Constitutional:      Appearance: Normal appearance.  Genitourinary:     Vulva and urethral meatus normal.  No lesions in the vagina.     Right Labia: No rash, lesions or skin changes.    Left Labia: No lesions, skin changes or rash.    No vaginal discharge or tenderness.     No vaginal prolapse present.    Severe vaginal atrophy present.     Right Adnexa: not tender, not palpable and no mass present.    Left Adnexa: not tender, not palpable and no mass present.    No cervical motion tenderness or discharge.     Uterus is not enlarged, tender or irregular.  Breasts:    Right: Normal.     Left: Normal.  HENT:     Head: Normocephalic.  Neck:     Thyroid: No thyroid mass, thyromegaly or thyroid tenderness.  Cardiovascular:     Rate and Rhythm: Normal rate and regular rhythm.     Heart sounds: Normal heart sounds, S1 normal and S2 normal.  Pulmonary:     Effort: Pulmonary effort is normal.     Breath sounds: Normal breath sounds and air entry.  Abdominal:     General: There is no  distension.     Palpations: Abdomen is soft. There is no mass.     Tenderness: There is no abdominal tenderness. There is no guarding or rebound.  Musculoskeletal:        General: Normal range of motion.     Cervical back: Full passive range of motion without pain, normal range of motion and neck supple. No tenderness.     Right lower leg: No edema.     Left lower leg: No edema.  Neurological:     Mental Status: She is alert.  Skin:    General: Skin is warm.  Psychiatric:        Mood and Affect: Mood normal.        Behavior: Behavior normal.        Thought Content: Thought content normal.  Vitals and nursing note reviewed. Exam conducted with a chaperone present.       A:         Well Woman GYN exam                             P:        Pap smear not indicated Encouraged annual mammogram screening Colon cancer screening not indicated DXA up-to-date Counseled on evenity and Prolia.  Information provided.  She will consider for worsening bone scan Labs and immunizations to do with PMD Encouraged healthy lifestyle practices Encouraged Vit D and Calcium   No follow-ups on file.  Earley Favor

## 2023-02-15 ENCOUNTER — Encounter: Payer: Self-pay | Admitting: Internal Medicine

## 2023-02-15 MED ORDER — HYDROCODONE BIT-HOMATROP MBR 5-1.5 MG/5ML PO SOLN: 5.0000 mL | Freq: Four times a day (QID) | ORAL | 0 refills | Status: AC | PRN

## 2023-02-23 ENCOUNTER — Other Ambulatory Visit: Payer: Self-pay

## 2023-02-23 DIAGNOSIS — Z79899 Other long term (current) drug therapy: Secondary | ICD-10-CM

## 2023-02-23 DIAGNOSIS — E785 Hyperlipidemia, unspecified: Secondary | ICD-10-CM | POA: Diagnosis not present

## 2023-02-24 LAB — HEPATIC FUNCTION PANEL
ALT: 16 [IU]/L (ref 0–32)
AST: 29 [IU]/L (ref 0–40)
Albumin: 4.1 g/dL (ref 3.7–4.7)
Alkaline Phosphatase: 86 [IU]/L (ref 44–121)
Bilirubin Total: 0.7 mg/dL (ref 0.0–1.2)
Bilirubin, Direct: 0.25 mg/dL (ref 0.00–0.40)
Total Protein: 7.4 g/dL (ref 6.0–8.5)

## 2023-02-25 LAB — NMR, LIPOPROFILE
Cholesterol, Total: 161 mg/dL (ref 100–199)
HDL Particle Number: 34.6 umol/L (ref >=30.5)
HDL-C: 79 mg/dL (ref >39)
LDL Particle Number: 655 nmol/L (ref <1000)
LDL Size: 21.2 nmol (ref >20.5)
LDL-C (NIH Calc): 70 mg/dL (ref 0–99)
LP-IR Score: 50 — ABNORMAL HIGH (ref <=45)
Small LDL Particle Number: <90 nmol/L (ref <=527)
Triglycerides: 62 mg/dL (ref 0–149)

## 2023-03-01 ENCOUNTER — Ambulatory Visit: Payer: Medicare PPO | Admitting: Pulmonary Disease

## 2023-03-02 ENCOUNTER — Telehealth: Payer: Self-pay | Admitting: *Deleted

## 2023-03-02 NOTE — Telephone Encounter (Signed)
-----   Message from [name] sent at 02/09/2023  2:19 PM EST ----- She is considering Evenity and or prolia. Has twisted esophagus and reflux and cannot do fosamax . Worsening osteopenia with no fractures. Remote osteoporosis on dxa Dr. Carpen

## 2023-03-02 NOTE — Telephone Encounter (Signed)
 Insurance information submitted to [employer] portal. Will await summary of benefits for Evenity.

## 2023-03-03 NOTE — Telephone Encounter (Signed)
 PA submitted to insurance plan via cover my meds. Key: [id number]. Will await response.

## 2023-03-05 ENCOUNTER — Encounter: Payer: Self-pay | Admitting: Internal Medicine

## 2023-03-11 NOTE — Telephone Encounter (Signed)
Appeal letter signed by Dr. Karma Greaser and faxed to [facility] and Appeals Department at [phone]. Will await response.

## 2023-03-11 NOTE — Telephone Encounter (Signed)
Message from [facility]: "unfortunately, your coverage or payment under your Medicare Part B benefit for the drug Evenity was denied."   Appeal letter written and taken to Dr. Bonney Roussel desk for review and signature. Will fax once signed.

## 2023-03-12 ENCOUNTER — Ambulatory Visit (INDEPENDENT_AMBULATORY_CARE_PROVIDER_SITE_OTHER): Payer: Medicare PPO

## 2023-03-12 ENCOUNTER — Encounter: Payer: Self-pay | Admitting: Nurse Practitioner

## 2023-03-12 ENCOUNTER — Ambulatory Visit: Payer: Medicare PPO | Admitting: Nurse Practitioner

## 2023-03-12 VITALS — BP 140/70 | HR 88 | Ht 62.5 in | Wt 124.0 lb

## 2023-03-12 DIAGNOSIS — J449 Chronic obstructive pulmonary disease, unspecified: Secondary | ICD-10-CM | POA: Diagnosis not present

## 2023-03-12 DIAGNOSIS — J471 Bronchiectasis with (acute) exacerbation: Secondary | ICD-10-CM

## 2023-03-12 DIAGNOSIS — R059 Cough, unspecified: Secondary | ICD-10-CM

## 2023-03-12 DIAGNOSIS — Z471 Aftercare following joint replacement surgery: Secondary | ICD-10-CM | POA: Diagnosis not present

## 2023-03-12 DIAGNOSIS — Z96611 Presence of right artificial shoulder joint: Secondary | ICD-10-CM | POA: Diagnosis not present

## 2023-03-12 MED ORDER — SODIUM CHLORIDE 0.9 % IN NEBU: 3.0000 mL | INHALATION_SOLUTION | Freq: Two times a day (BID) | RESPIRATORY_TRACT | 12 refills | Status: DC | PRN

## 2023-03-12 MED ORDER — ALBUTEROL SULFATE (2.5 MG/3ML) 0.083% IN NEBU: 2.5000 mg | INHALATION_SOLUTION | Freq: Four times a day (QID) | RESPIRATORY_TRACT | 12 refills | Status: DC | PRN

## 2023-03-12 NOTE — Progress Notes (Signed)
@Patient  ID: Miranda Thompson, female    DOB: 1934/01/20, 89 y.o.   MRN: [id number]  Chief Complaint  Patient presents with   Follow-up    F/U visit    Referring provider: Corwin Levins, MD  HPI: 89 year old female, former smoker followed for COPD, bronchiectasis, and lung nodules. She is a patient of Dr. Myrlene Broker and last seen in office on 10/12/2022. Past medical history significant for atherosclerosis, GERD, OA, HLD.   TEST/EVENTS:  10/10/2021 echocardiogram: EF 66 5%.  Normal diastolic parameters.  RV size and function normal.  Normal PASP.  Trivial MR. 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  Small hiatal hernia.  Patulous esophagus.  No LAD.  Bilateral bronchiectasis, more pronounced in the lingula.  Scattered solid pulmonary nodules some of which demonstrate central cystic change.  Pulmonary findings are not significantly changed when compared to previous exam. 01/07/2022 ONO: 4 min <88%, SpO2 low 85% with average 93.4%  01/01/2021: OV with Dr. Tonia Brooms. Here today for follow up after CT scan of the chest. CT completed in October 2022 with varicoid btx and a few cysts within the lungs. Other scattered small nodules. No significant change. Plan to repeat in 2-3 years. Breathing doesn't limit activity. Does use flutter valve occasionally. Not much sputum production. Doing well with stiolto.   01/02/2022: OV with Deloma Spindle NP for follow-up to discuss CT scan results.  Her bronchiectasis and lung nodules were stable when compared to previous imaging.  No increased size or progression of disease noted.  She does have an enlarged right pulmonary artery; however, echocardiogram from August 2023 was without any right heart strain and with normal pulmonary artery pressures.  Today, she tells me that she has had some trouble with her breathing over the last 6 to 8 months.  Feels like she had a slow decline.  Gets winded with simple  tasks around the house such as changing the bed sheets.  She does also have some associated palpitations, feeling like her heart is fluttering, and dizziness.  She is being evaluated by cardiology for concern for A-fib.  She just completed wearing a heart monitor this week.  Has not received the results from this yet.  No increased cough or chest congestion.  She denies any wheezing, fevers, night sweats, mops this, weight gain or loss, lower extremity swelling, orthopnea, PND.  No episodes of syncope or chest pain.  She is currently on Stiolto.  Uses guaifenesin and flutter valve as needed for chest congestion.    04/02/2022: OV with Keelan Pomerleau NP for follow up with her husband. Her daughter is also on the phone during our visit. She tells me that she is feeling unchanged compared to when she was here last. She still has fatigue and gets more short winded with activities around the house, especially changing the bed. She does have some associated palpitations; workup with cardiology has been unremarkable. Occasionally has some associated palpitations. At this point, symptoms have been ongoing for the last year. She doesn't feel any worse today. No increased cough or chest congestion. Denies fevers, chills, hemoptysis, night sweats. Eating and drinking well. She doesn't use her rescue inhaler often. She is compliant with Stiolto and uses guaifenesin and flutter as needed.  She did start using a dental paste with fluoride in it and thinks she left it on for too long the other night. She also didn't rinse it off like she normally does.  She has a few sores in her mouth now, which are all very small and white/red. Denies any associated symptoms.   10/12/2022: OV with Dr. Tonia Brooms. Overall doing well. Uses stiolto. Had a CT scan in October with stable btx. No exacerbations. With ongoing back issues, likely not a great candidate for vest therapy.   03/12/2023: Today - acute Discussed the use of AI scribe software for clinical  note transcription with the patient, who gave verbal consent to proceed.  History of Present Illness   The patient, accompanied by her husband, presents for acute visit. She reported feeling unwell since November, with symptoms including a tight chest, a persistent cough, and significant fatigue. The patient described the cough as severe, lasting up to 20 minutes at a time, and initially unproductive. She then started developing increased phlegm production that was green. She also reported a low-grade fever. She went to her PCP 11/21 who prescribed levaquin and prednisone with improvement in symptoms. She had tested negative for COVID-19. Her husband was also sick at the time.    About a week or two ago, symptoms returned. She was coughing up sputum streaked with blood, but this has since resolved. The patient also reported shortness of breath and audible wheezing, which has improved as well but not completely resolved. The cough is less frequent and productive but still sometimes gets up green to yellow phlegm. She does feel like her breathing is at her baseline for the most part. No fevers, chills. Eating and drinking well.  The patient has a history of bronchiectasis and has been managing her condition with albuterol and Stiolto inhalers, as well as Mucinex once a day. She also reported using a flutter valve, but not on a daily basis. She does not have a nebulizer.   Despite her current symptoms, the patient believes she is on the upward trend of recovery.       No Known Allergies  Immunization History  Administered Date(s) Administered   Fluad Quad(high Dose 65+) 11/08/2019, 11/14/2020, 11/19/2021   Influenza Split 12/03/2011   Influenza Whole 11/30/2007, 11/30/2008, 12/03/2009, 11/24/2010   Influenza, High Dose Seasonal PF 11/19/2015, 12/02/2016, 12/17/2017, 11/22/2018   Influenza,inj,Quad PF,6+ Mos 11/10/2012, 11/08/2013, 11/06/2014   Influenza-Unspecified 12/02/2016, 11/22/2018,  11/14/2021, 12/15/2022   PFIZER Comirnaty(Gray Top)Covid-19 Tri-Sucrose Vaccine 05/27/2020   PFIZER(Purple Top)SARS-COV-2 Vaccination 03/14/2019, 04/04/2019, 11/18/2019   Pfizer(Comirnaty)Fall Seasonal Vaccine 12 years and older 11/14/2021   Pneumococcal Conjugate-13 05/09/2014   Pneumococcal Polysaccharide-23 11/07/2007   Tdap 05/09/2014   Unspecified SARS-COV-2 Vaccination 12/15/2022   Zoster Recombinant(Shingrix) 04/10/2018, 01/08/2019   Zoster, Live 12/11/2009    Past Medical History:  Diagnosis Date   Adenomatous colon polyp    Allergic rhinitis    Anemia    pt states no anemia in her past hx thst she is aware of    Atrial fibrillation (HCC)    goes in and out of this rhythm- takes Tenormin   Breast cancer, left breast (HCC) 1994 with lumpectomy   chemo,tamox,radiation   Bronchiectasis with acute exacerbation (HCC)    COPD (chronic obstructive pulmonary disease) (HCC)    Coronary artery disease    Diverticulosis of colon    DJD (degenerative joint disease)    Dysrhythmia    palpitations   Family history of brain cancer    Family history of colon cancer 02/14/2018   Family history of colon cancer    Family history of malignant neoplasm of gastrointestinal tract    Family history of melanoma  Family history of uterine cancer    Fibromyalgia    GERD (gastroesophageal reflux disease) 02/14/2018   Glaucoma    Hemoptysis    History of pneumonia    Hypercholesteremia    pt denies   Hypertension    Low back pain syndrome    Neuromuscular disorder (HCC)    hx fibromyalgia    Osteopenia    Pneumonia    hx   Pseudomonas infection    Rotator cuff syndrome of right shoulder    Spinal stenosis     Tobacco History: Social History   Tobacco Use  Smoking Status Former   Current packs/day: 0.00   Types: Cigarettes   Quit date: 02/24/1967   Years since quitting: 56.0  Smokeless Tobacco Never   Counseling given: Not Answered   Outpatient Medications Prior to Visit   Medication Sig Dispense Refill   albuterol (VENTOLIN HFA) 108 (90 Base) MCG/ACT inhaler TAKE 2 PUFFS BY MOUTH EVERY 6 HOURS AS NEEDED FOR WHEEZE OR SHORTNESS OF BREATH 8.5 each 6   ASPIRIN 81 PO Take 1 tablet by mouth daily.     Calcium Carb-Cholecalciferol 600-800 MG-UNIT TABS Take 1 tablet by mouth daily.     diltiazem (CARDIZEM CD) 120 MG 24 hr capsule TAKE 1 CAPSULE BY MOUTH EVERY DAY 90 capsule 3   guaiFENesin (MUCINEX) 600 MG 12 hr tablet Take by mouth 2 (two) times daily.     losartan (COZAAR) 25 MG tablet TAKE 0.5 TABLETS BY MOUTH DAILY. PLEASE KEEP YOUR UPCOMING APPOINTMENT FOR FUTURE REFILLS. 45 tablet 2   LUMIGAN 0.01 % SOLN      Multiple Vitamins-Minerals (MULTIVITAMIN & MINERAL PO) Take 1 tablet by mouth daily.     nitroGLYCERIN (NITROSTAT) 0.4 MG SL tablet Place 1 tablet (0.4 mg total) under the tongue every 5 (five) minutes as needed for chest pain. Please call [phone] to make appointment with Dr. Tenny Craw for future refills. 25 tablet 0   Respiratory Therapy Supplies (FLUTTER) DEVI 1 application by Does not apply route 3 (three) times daily. 1 each 0   rosuvastatin (CRESTOR) 5 MG tablet Take 1 tablet (5 mg total) by mouth daily. 90 tablet 3   Saccharomyces boulardii (FLORASTOR PO) Take 1 capsule by mouth daily.     Tiotropium Bromide-Olodaterol (STIOLTO RESPIMAT) 2.5-2.5 MCG/ACT AERS Inhale 2 puffs into the lungs daily. 12 g 1   VITAMIN D PO Take 4,000 Int'l Units by mouth.     Zinc 30 MG CAPS      SODIUM FLUORIDE 5000 SENSITIVE 1.1-5 % GEL Take by mouth as directed.     No facility-administered medications prior to visit.     Review of Systems:   Constitutional: No weight loss or gain, night sweats, fevers, chills +fatigue, lassitude  HEENT: No headaches, difficulty swallowing, tooth/dental problems, or sore throat. No sneezing, itching, ear ache, nasal congestion, or post nasal drip CV: No chest pain, orthopnea, PND, swelling in lower extremities, anasarca, syncope,  dizziness, palpitations.  Resp: +shortness of breath with exertion; productive cough; occasional wheeze. No excess mucus or change in color of mucus. No hemoptysis. No chest wall deformity GI:  No heartburn, indigestion, abdominal pain, nausea, vomiting, diarrhea, change in bowel habits, loss of appetite, bloody stools.  Skin: No rash, lesions, ulcerations MSK:  No joint pain or swelling.  No decreased range of motion.  +chronic back pain. Neuro: No dizziness or lightheadedness.  Psych: No depression or anxiety. Mood stable.     Physical Exam:  BP Marland Kitchen)  140/70 (BP Location: Right Arm, Patient Position: Sitting, Cuff Size: Normal)   Pulse 88   Ht 5' 2.5" (1.588 m)   Wt 124 lb (56.2 kg)   LMP 02/23/1985   SpO2 98%   BMI 22.32 kg/m   GEN: Pleasant, interactive, well-appearing; elderly; in no acute distress. HEENT:  Normocephalic and atraumatic. PERRLA. Sclera white. Nasal turbinates pink, moist and patent bilaterally. No rhinorrhea present. Oropharynx pink and moist, without exudate or edema. No lesions, ulcerations, or postnasal drip.  NECK:  Supple w/ fair ROM. No JVD present. Normal carotid impulses w/o bruits. Thyroid symmetrical with no goiter or nodules palpated. No lymphadenopathy.   CV: RRR, no m/r/g, no peripheral edema. Pulses intact, +2 bilaterally. No cyanosis, pallor or clubbing. PULMONARY:  Unlabored, regular breathing. Diminished bilaterally A&P w/o wheezes/rales/rhonchi. No accessory muscle use.  GI: BS present and normoactive. Soft, non-tender to palpation. No organomegaly or masses detected.  MSK: No erythema, warmth or tenderness. Cap refil <2 sec all extrem. No deformities or joint swelling noted.  Neuro: A/Ox3. No focal deficits noted.   Skin: Warm, no lesions or rashe Psych: Normal affect and behavior. Judgement and thought content appropriate.     Lab Results:  CBC    Component Value Date/Time   WBC 6.7 10/22/2022 1402   RBC 3.79 (L) 10/22/2022 1402   HGB  12.5 10/22/2022 1402   HGB 12.3 04/10/2021 1538   HCT 37.2 10/22/2022 1402   HCT 35.8 04/10/2021 1538   PLT 272.0 10/22/2022 1402   PLT 252 04/10/2021 1538   MCV 98.1 10/22/2022 1402   MCV 96 04/10/2021 1538   MCH 32.9 04/10/2021 1538   MCH 32.3 10/11/2016 1131   MCHC 33.5 10/22/2022 1402   RDW 12.6 10/22/2022 1402   RDW 11.4 (L) 04/10/2021 1538   LYMPHSABS 1.2 10/22/2022 1402   MONOABS 0.6 10/22/2022 1402   EOSABS 0.2 10/22/2022 1402   BASOSABS 0.1 10/22/2022 1402    BMET    Component Value Date/Time   NA 129 (L) 10/22/2022 1402   NA 132 (L) 10/01/2021 1127   K 4.0 10/22/2022 1402   CL 94 (L) 10/22/2022 1402   CO2 29 10/22/2022 1402   GLUCOSE 88 10/22/2022 1402   BUN 14 10/22/2022 1402   BUN 13 10/01/2021 1127   CREATININE 0.63 10/22/2022 1402   CALCIUM 9.4 10/22/2022 1402   GFRNONAA >60 10/11/2016 1131   GFRAA >60 10/11/2016 1131    BNP No results found for: "BNP"   Imaging:  No results found.  cefTRIAXone (ROCEPHIN) injection 1 g     Date Action Dose Route User   01/14/2023 1539 Given 1 g Intramuscular (Left Ventrogluteal) Ferdie Ping, CMA      methylPREDNISolone acetate (DEPO-MEDROL) injection 80 mg     Date Action Dose Route User   01/14/2023 1541 Given 80 mg Intramuscular (Right Ventrogluteal) Ferdie Ping, CMA           No data to display          No results found for: "NITRICOXIDE"      Assessment & Plan:     Bronchiectasis with acute exacerbation  Chronic condition with increased purulent sputum production. Recent exacerbation in November improved after Levaquin, prednisone. Recurrent symptoms approximately 1-2 weeks ago with scant hemoptysis. Clinically improving; still with productive cough. VS stable. Sputum culture to check for resistant bacteria like Pseudomonas given hx. Check AFB. Optimize bronchodilator and mucociliary clearance regimen with albuterol nebs, hypertonic saline nebs and mucolytic.  CXR today to rule out  superimposed infection. If stable, will hold off on antimicrobial therapy as symptoms are improving and want to avoid overtreating to avoid resistance. Goal is to avoid hospitalizations and manage symptoms. Close follow up and action plan in place - Send sputum culture and AFB for analysis - Order chest x-ray - Order CT scan based on chest x-ray results - last October 2023 - Prescribe albuterol and hypertonic saline nebulizer treatments bid - Provide nebulizer machine for home use - Continue albuterol and Stiolto inhalers - Use flutter valve after nebulizer treatments - Adjust Mucinex dosage based on sputum thickness/congestion - Instruct to call if hemoptysis or symptoms worsen/fail to improve  Follow-up - Schedule follow-up appointment with Dr. Francine Graven - Call to schedule CT scan - Review sputum culture results and adjust treatment if necessary - Monitor condition and adjust treatment plan based on symptoms and test results.         I spent 45 minutes of dedicated to the care of this patient on the date of this encounter to include pre-visit review of records, face-to-face time with the patient discussing conditions above, post visit ordering of testing, clinical documentation with the electronic health record, making appropriate referrals as documented, and communicating necessary findings to members of the patients care team.  Noemi Chapel, NP 03/12/2023  Pt aware and understands NP's role.

## 2023-03-12 NOTE — Patient Instructions (Addendum)
Continue Albuterol inhaler 2 puffs or 3 mL neb every 6 hours as needed for shortness of breath or wheezing. Notify if symptoms persist despite rescue inhaler/neb use.  Continue Stiolto 2 puffs daily Continue guaifenesin 600 mg 1-2 daily for chest congestion Continue flutter valve 2-3 times a day for chest congestion   When you are having chest congestion, productive cough, chest tightness, wheezing, increased shortness of breath... -Use the albuterol breathing treatment twice a day followed by a saline nebulizer 3 mL treatment twice a day then your flutter valve 10 times after the neb  -You will use your Stiolto in the morning after your treatment  -You should also increase mucinex to twice a day if you are having thick phlegm or more chest congestion that feels more difficult to clear   Chest x ray today  We will send the one sputum culture off Please collect the second and return Monday morning within 4 hours of collecting   Follow up in 3-4 weeks with Dr. Francine Graven (new pt 30 min slot) or Katie Tarshia Kot,NP. If symptoms do not improve or worsen, please contact office for sooner follow up or seek emergency care.

## 2023-03-13 DIAGNOSIS — J471 Bronchiectasis with (acute) exacerbation: Secondary | ICD-10-CM | POA: Diagnosis not present

## 2023-03-15 LAB — RESPIRATORY CULTURE OR RESPIRATORY AND SPUTUM CULTURE
MICRO NUMBER:: 15969948
SPECIMEN QUALITY:: ADEQUATE

## 2023-03-16 ENCOUNTER — Other Ambulatory Visit: Payer: Self-pay | Admitting: *Deleted

## 2023-03-16 NOTE — Telephone Encounter (Signed)
[employer] requesting additional information for PA request review. Last AEX note and most recent BMD faxed to [facility] at [phone]. Will await response.

## 2023-03-18 ENCOUNTER — Other Ambulatory Visit: Payer: Self-pay

## 2023-03-18 ENCOUNTER — Other Ambulatory Visit: Payer: Self-pay | Admitting: Obstetrics and Gynecology

## 2023-03-18 ENCOUNTER — Other Ambulatory Visit: Payer: Medicare PPO

## 2023-03-18 DIAGNOSIS — J479 Bronchiectasis, uncomplicated: Secondary | ICD-10-CM

## 2023-03-18 DIAGNOSIS — Z853 Personal history of malignant neoplasm of breast: Secondary | ICD-10-CM

## 2023-03-18 DIAGNOSIS — N949 Unspecified condition associated with female genital organs and menstrual cycle: Secondary | ICD-10-CM

## 2023-03-18 DIAGNOSIS — N9489 Other specified conditions associated with female genital organs and menstrual cycle: Secondary | ICD-10-CM

## 2023-03-18 DIAGNOSIS — E2839 Other primary ovarian failure: Secondary | ICD-10-CM

## 2023-03-18 DIAGNOSIS — Z01419 Encounter for gynecological examination (general) (routine) without abnormal findings: Secondary | ICD-10-CM

## 2023-03-19 ENCOUNTER — Telehealth: Payer: Self-pay | Admitting: Nurse Practitioner

## 2023-03-19 DIAGNOSIS — J479 Bronchiectasis, uncomplicated: Secondary | ICD-10-CM

## 2023-03-19 NOTE — Telephone Encounter (Signed)
Spoke with pt. Please see alternative telephone encounter. NFN

## 2023-03-19 NOTE — Addendum Note (Signed)
Addended by: Noemi Chapel on: 03/19/2023 11:36 AM   Modules accepted: Orders

## 2023-03-19 NOTE — Telephone Encounter (Signed)
03/19/2023: Notified pt of sputum culture results with pseudomonas aeruginosa, intermediate to cipro and levofloxacin. Last abx course of levaquin in November 2024. Her infectious symptoms have mostly resolved. She still produces some phlegm but it has significant improved. No fevers, chills, hemoptysis. Eating and drinking well. Still fatigued but not unchanged from baseline. Not uncommon with bronchiectasis. She's had positive cultures in the past. No superimposed infection on imaging. Suspect she is colonized. Will hold off on further abx. She will notify if she develops new/worsening symptoms. Advised to continue mucociliary clearance therapies. She notified me that she has yet to get her neb. Will follow up with DME on this. All questions addressed. Will order CT chest as she has not had one since 11/2021.

## 2023-03-21 LAB — RESPIRATORY CULTURE OR RESPIRATORY AND SPUTUM CULTURE
MICRO NUMBER:: 15989315
SPECIMEN QUALITY:: ADEQUATE

## 2023-03-22 DIAGNOSIS — J471 Bronchiectasis with (acute) exacerbation: Secondary | ICD-10-CM | POA: Diagnosis not present

## 2023-03-22 NOTE — Telephone Encounter (Signed)
Called and spoke with Mitch ([employer]) regarding DME order for Nebulizer machine.  He looked into the order and states they have been trying to reach the patient regarding her responsibility, however, it has been sent out anyway.  Sent out for delivery.  He is going to go ahead and have it sent to a driver.

## 2023-03-24 ENCOUNTER — Ambulatory Visit (INDEPENDENT_AMBULATORY_CARE_PROVIDER_SITE_OTHER): Payer: Medicare PPO

## 2023-03-24 VITALS — Ht 62.5 in | Wt 124.0 lb

## 2023-03-24 DIAGNOSIS — Z0000 Encounter for general adult medical examination without abnormal findings: Secondary | ICD-10-CM

## 2023-03-24 NOTE — Progress Notes (Signed)
Subjective:   Miranda Thompson is a 89 y.o. female who presents for Medicare Annual (Subsequent) preventive examination.  Visit Complete: Virtual I connected with  Oris Drone on 03/24/23 by a video and audio enabled telemedicine application and verified that I am speaking with the correct person using two identifiers.  Patient Location: Home  Provider Location: Office/Clinic  I discussed the limitations of evaluation and management by telemedicine. The patient expressed understanding and agreed to proceed.  Vital Signs: Because this visit was a virtual/telehealth visit, some criteria may be missing or patient reported. Any vitals not documented were not able to be obtained and vitals that have been documented are patient reported.   Cardiac Risk Factors include: advanced age (>25men, >54 women);Other (see comment), Risk factor comments: Aortic atherosclerosis, Cardiac arrhythmia, COPD,  Osteoporosis     Objective:    Today's Vitals   03/24/23 0802  Weight: 124 lb (56.2 kg)  Height: 5' 2.5" (1.588 m)   Body mass index is 22.32 kg/m.     03/24/2023    8:18 AM 03/27/2022    2:50 PM 03/26/2021    3:33 PM 02/19/2021    5:32 PM 04/16/2020   10:19 AM 03/21/2020    2:37 PM 10/27/2016    3:29 PM  Advanced Directives  Does Patient Have a Medical Advance Directive? Yes Yes Yes Yes Yes Yes Yes  Type of Estate agent of [place];Living will Healthcare Power of [place];Living will Living will;Healthcare Power of [employer] Power of [place];Living will Living will;Healthcare Power of Attorney Living will;Healthcare Power of [employer] Power of [place];Living will  Does patient want to make changes to medical advance directive?   No - Patient declined  No - Patient declined No - Patient declined   Copy of Healthcare Power of Attorney in Chart? No - copy requested No - copy requested No - copy requested  No - copy requested No - copy requested      Current Medications (verified) Outpatient Encounter Medications as of 03/24/2023  Medication Sig   albuterol (PROVENTIL) (2.5 MG/3ML) 0.083% nebulizer solution Take 3 mLs (2.5 mg total) by nebulization every 6 (six) hours as needed for wheezing or shortness of breath.   albuterol (VENTOLIN HFA) 108 (90 Base) MCG/ACT inhaler TAKE 2 PUFFS BY MOUTH EVERY 6 HOURS AS NEEDED FOR WHEEZE OR SHORTNESS OF BREATH   ASPIRIN 81 PO Take 1 tablet by mouth daily.   Calcium Carb-Cholecalciferol 600-800 MG-UNIT TABS Take 1 tablet by mouth daily.   diltiazem (CARDIZEM CD) 120 MG 24 hr capsule TAKE 1 CAPSULE BY MOUTH EVERY DAY   guaiFENesin (MUCINEX) 600 MG 12 hr tablet Take by mouth 2 (two) times daily.   losartan (COZAAR) 25 MG tablet TAKE 0.5 TABLETS BY MOUTH DAILY. PLEASE KEEP YOUR UPCOMING APPOINTMENT FOR FUTURE REFILLS.   LUMIGAN 0.01 % SOLN    Multiple Vitamins-Minerals (MULTIVITAMIN & MINERAL PO) Take 1 tablet by mouth daily.   nitroGLYCERIN (NITROSTAT) 0.4 MG SL tablet Place 1 tablet (0.4 mg total) under the tongue every 5 (five) minutes as needed for chest pain. Please call [phone] to make appointment with Dr. Tenny Craw for future refills.   Respiratory Therapy Supplies (FLUTTER) DEVI 1 application by Does not apply route 3 (three) times daily.   rosuvastatin (CRESTOR) 5 MG tablet Take 1 tablet (5 mg total) by mouth daily.   Saccharomyces boulardii (FLORASTOR PO) Take 1 capsule by mouth daily.   Tiotropium Bromide-Olodaterol (STIOLTO RESPIMAT) 2.5-2.5 MCG/ACT AERS Inhale  2 puffs into the lungs daily.   VITAMIN D PO Take 4,000 Int'l Units by mouth.   Zinc 30 MG CAPS    sodium chloride 0.9 % nebulizer solution Take 3 mLs by nebulization 2 (two) times daily as needed (cough/congestion). (Patient not taking: Reported on 03/24/2023)   No facility-administered encounter medications on file as of 03/24/2023.    Allergies (verified) Patient has no known allergies.   History: Past Medical History:   Diagnosis Date   Adenomatous colon polyp    Allergic rhinitis    Anemia    pt states no anemia in her past hx thst she is aware of    Atrial fibrillation (HCC)    goes in and out of this rhythm- takes Tenormin   Breast cancer, left breast (HCC) 1994 with lumpectomy   chemo,tamox,radiation   Bronchiectasis with acute exacerbation (HCC)    COPD (chronic obstructive pulmonary disease) (HCC)    Coronary artery disease    Diverticulosis of colon    DJD (degenerative joint disease)    Dysrhythmia    palpitations   Family history of brain cancer    Family history of colon cancer 02/14/2018   Family history of colon cancer    Family history of malignant neoplasm of gastrointestinal tract    Family history of melanoma    Family history of uterine cancer    Fibromyalgia    GERD (gastroesophageal reflux disease) 02/14/2018   Glaucoma    Hemoptysis    History of pneumonia    Hypercholesteremia    pt denies   Hypertension    Low back pain syndrome    Neuromuscular disorder (HCC)    hx fibromyalgia    Osteopenia    Pneumonia    hx   Pseudomonas infection    Rotator cuff syndrome of right shoulder    Spinal stenosis    Past Surgical History:  Procedure Laterality Date   CATARACT EXTRACTION Right    COLONOSCOPY     left breast lumpectomy and LN dissection  06/1992   Dr. Ezzard Standing   LUNG BIOPSY  1968   TSurg---Dr. Bascom Levels   LUNG BIOPSY  2012   dr Kriste Basque   POLYPECTOMY     REVERSE SHOULDER ARTHROPLASTY Right 08/02/2015   Procedure: RIGHT REVERSE TOTAL SHOULDER ARTHROPLASTY;  Surgeon: Beverely Low, MD;  Location: [facility];  Service: Orthopedics;  Laterality: Right;   REVERSE TOTAL SHOULDER ARTHROPLASTY Right 08/02/2015   TONSILLECTOMY     TOTAL KNEE ARTHROPLASTY Right 01/21/2015   Procedure: TOTAL RIGHT KNEE ARTHROPLASTY;  Surgeon: Durene Romans, MD;  Location: [facility] ORS;  Service: Orthopedics;  Laterality: Right;   TOTAL KNEE ARTHROPLASTY Left 03/23/2016   Procedure: LEFT TOTAL KNEE  ARTHROPLASTY;  Surgeon: Durene Romans, MD;  Location: [facility] ORS;  Service: Orthopedics;  Laterality: Left;   Family History  Problem Relation Age of Onset   Heart attack Mother        Deceased age 88   Colon cancer Father 24       deceased age 63 from colon ca.   Hypertension Brother    Colon polyps Daughter    Uterine cancer Daughter 31   Brain cancer Maternal Uncle    Stroke Maternal Grandmother    Other Paternal Uncle 12       possible cancer   Melanoma Daughter 6   Melanoma Niece 25       d. 58   Esophageal cancer Neg Hx    Stomach cancer Neg Hx  Rectal cancer Neg Hx    Social History   Socioeconomic History   Marital status: Married    Spouse name: Zollie Beckers   Number of children: 5   Years of education: Not on file   Highest education level: Not on file  Occupational History   Occupation: Retired  Tobacco Use   Smoking status: Former    Current packs/day: 0.00    Types: Cigarettes    Quit date: 02/24/1967    Years since quitting: 56.1   Smokeless tobacco: Never  Vaping Use   Vaping status: Never Used  Substance and Sexual Activity   Alcohol use: Not Currently    Comment: occasionally   Drug use: No   Sexual activity: Not Currently    Partners: Male    Comment: husband vasectomy, older than 16, less than 5  Other Topics Concern   Not on file  Social History Narrative   Lives with husband.   Social Drivers of Corporate investment banker Strain: Low Risk  (03/24/2023)   Overall Financial Resource Strain (CARDIA)    Difficulty of Paying Living Expenses: Not hard at all  Food Insecurity: No Food Insecurity (03/24/2023)   Hunger Vital Sign    Worried About Running Out of Food in the Last Year: Never true    Ran Out of Food in the Last Year: Never true  Transportation Needs: No Transportation Needs (03/24/2023)   PRAPARE - Administrator, Civil Service (Medical): No    Lack of Transportation (Non-Medical): No  Physical Activity: Sufficiently Active  (03/24/2023)   Exercise Vital Sign    Days of Exercise per Week: 5 days    Minutes of Exercise per Session: 50 min  Stress: No Stress Concern Present (03/24/2023)   [employer] of Occupational Health - Occupational Stress Questionnaire    Feeling of Stress : Only a little  Social Connections: Moderately Isolated (03/24/2023)   Social Connection and Isolation Panel [NHANES]    Frequency of Communication with Friends and Family: More than three times a week    Frequency of Social Gatherings with Friends and Family: More than three times a week    Attends Religious Services: Never    Database administrator or Organizations: No    Attends Engineer, structural: Never    Marital Status: Married    Tobacco Counseling Counseling given: Not Answered   Clinical Intake:  Pre-visit preparation completed: Yes  Pain : No/denies pain     BMI - recorded: 22.32 Nutritional Status: BMI of 19-24  Normal Nutritional Risks: None  How often do you need to have someone help you when you read instructions, pamphlets, or other written materials from your doctor or pharmacy?: 1 - Never     Information entered by :: Jose Corvin, RMA   Activities of Daily Living    03/24/2023    8:03 AM 03/27/2022    2:51 PM  In your present state of health, do you have any difficulty performing the following activities:  Hearing? 0 0  Vision? 0 0  Difficulty concentrating or making decisions? 0 0  Walking or climbing stairs? 0 0  Dressing or bathing? 0 0  Doing errands, shopping? 0 0  Preparing Food and eating ? N N  Using the Toilet? N N  In the past six months, have you accidently leaked urine? N N  Comment  wears pad for protection  Do you have problems with loss of bowel control? N N  Managing your Medications? N N  Managing your Finances? N N  Housekeeping or managing your Housekeeping? N N    Patient Care Team: Corwin Levins, MD as PCP - General (Internal Medicine) Wendall Stade, MD as PCP - Cardiology (Cardiology) Sallye Lat, MD as Consulting Physician (Ophthalmology) Janet Berlin, MD as Consulting Physician (Ophthalmology)  Indicate any recent Medical Services you may have received from other than [facility] providers in the past year (date may be approximate).     Assessment:   This is a routine wellness examination for Ifeoma.  Hearing/Vision screen Hearing Screening - Comments:: Denies hearing difficulties   Vision Screening - Comments:: Denies vision issues.    Goals Addressed             This Visit's Progress    My goal for 2024 is to keep on moving.   On track     Depression Screen    03/24/2023    8:24 AM 01/08/2023    3:08 PM 10/22/2022    1:16 PM 03/27/2022    2:48 PM 10/02/2021    2:59 PM 03/26/2021    4:02 PM 03/26/2021    3:21 PM  PHQ 2/9 Scores  PHQ - 2 Score 3 0 0 0 0 0 0  PHQ- 9 Score 4    1      Fall Risk    03/24/2023    8:19 AM 02/09/2023    1:48 PM 01/08/2023    3:07 PM 10/22/2022    1:15 PM 03/27/2022    2:51 PM  Fall Risk   Falls in the past year? 0 0 0 0 1  Number falls in past yr: 0 0 0 0 0  Injury with Fall? 0 0 0 0 0  Risk for fall due to : No Fall Risks No Fall Risks No Fall Risks No Fall Risks No Fall Risks  Follow up Falls prevention discussed;Falls evaluation completed Falls evaluation completed Falls evaluation completed Falls evaluation completed Falls prevention discussed    MEDICARE RISK AT HOME: Medicare Risk at Home Any stairs in or around the home?: Yes Home free of loose throw rugs in walkways, pet beds, electrical cords, etc?: Yes Adequate lighting in your home to reduce risk of falls?: Yes Life alert?: No Use of a cane, walker or w/c?: Yes (uses a cane sometimes) Grab bars in the bathroom?: Yes Shower chair or bench in shower?: Yes Elevated toilet seat or a handicapped toilet?: Yes  TIMED UP AND GO:  Was the test performed?  No    Cognitive Function:        03/24/2023    8:04 AM  03/27/2022    2:51 PM 03/26/2021    4:17 PM  6CIT Screen  What Year? 0 points 0 points 0 points  What month? 0 points 0 points 0 points  What time? 0 points 0 points 0 points  Count back from 20 0 points 0 points 0 points  Months in reverse 0 points 0 points 0 points  Repeat phrase 0 points 0 points 0 points  Total Score 0 points 0 points 0 points    Immunizations Immunization History  Administered Date(s) Administered   Fluad Quad(high Dose 65+) 11/08/2019, 11/14/2020, 11/19/2021   Influenza Split 12/03/2011   Influenza Whole 11/30/2007, 11/30/2008, 12/03/2009, 11/24/2010   Influenza, High Dose Seasonal PF 11/19/2015, 12/02/2016, 12/17/2017, 11/22/2018   Influenza,inj,Quad PF,6+ Mos 11/10/2012, 11/08/2013, 11/06/2014   Influenza-Unspecified 12/02/2016, 11/22/2018, 11/14/2021, 12/15/2022   PFIZER Comirnaty(Gray Top)Covid-19  Tri-Sucrose Vaccine 05/27/2020   PFIZER(Purple Top)SARS-COV-2 Vaccination 03/14/2019, 04/04/2019, 11/18/2019   Pfizer(Comirnaty)Fall Seasonal Vaccine 12 years and older 11/14/2021   Pneumococcal Conjugate-13 05/09/2014   Pneumococcal Polysaccharide-23 11/07/2007   Tdap 05/09/2014   Unspecified SARS-COV-2 Vaccination 12/15/2022   Zoster Recombinant(Shingrix) 04/10/2018, 01/08/2019   Zoster, Live 12/11/2009    TDAP status: Up to date  Flu Vaccine status: Up to date  Pneumococcal vaccine status: Up to date  Covid-19 vaccine status: Completed vaccines  Qualifies for Shingles Vaccine? Yes   Zostavax completed Yes   Shingrix Completed?: Yes  Screening Tests Health Maintenance  Topic Date Due   Colonoscopy  08/08/2019   COVID-19 Vaccine (7 - 2024-25 season) 02/09/2023   Medicare Annual Wellness (AWV)  03/23/2024   DTaP/Tdap/Td (2 - Td or Tdap) 05/08/2024   Pneumonia Vaccine 15+ Years old  Completed   INFLUENZA VACCINE  Completed   DEXA SCAN  Completed   Zoster Vaccines- Shingrix  Completed   HPV VACCINES  Aged Out    Health Maintenance  Health  Maintenance Due  Topic Date Due   Colonoscopy  08/08/2019   COVID-19 Vaccine (7 - 2024-25 season) 02/09/2023    Colorectal cancer screening: Referral to GI placed 03/24/2023. Pt aware the office will call re: appt.  Mammogram status: Completed 11/05/2022. Repeat every year  Bone Density status: Completed 11/05/2022. Results reflect: Bone density results: OSTEOPENIA. Repeat every 2 years.  Lung Cancer Screening: (Low Dose CT Chest recommended if Age 58-80 years, 20 pack-year currently smoking OR have quit w/in 15years.) does qualify.   Lung Cancer Screening Referral: 04/14/2023  Additional Screening:  Hepatitis C Screening: does not qualify;  Vision Screening: Recommended annual ophthalmology exams for early detection of glaucoma and other disorders of the eye. Is the patient up to date with their annual eye exam?  Yes  Who is the provider or what is the name of the office in which the patient attends annual eye exams? Dr. Burgess Estelle If pt is not established with a provider, would they like to be referred to a provider to establish care? No .   Dental Screening: Recommended annual dental exams for proper oral hygiene   Community Resource Referral / Chronic Care Management: CRR required this visit?  No   CCM required this visit?  No     Plan:     I have personally reviewed and noted the following in the patient's chart:   Medical and social history Use of alcohol, tobacco or illicit drugs  Current medications and supplements including opioid prescriptions. Patient is not currently taking opioid prescriptions. Functional ability and status Nutritional status Physical activity Advanced directives List of other physicians Hospitalizations, surgeries, and ER visits in previous 12 months Vitals Screenings to include cognitive, depression, and falls Referrals and appointments  In addition, I have reviewed and discussed with patient certain preventive protocols, quality  metrics, and best practice recommendations. A written personalized care plan for preventive services as well as general preventive health recommendations were provided to patient.     Brooke Steinhilber L Darielle Hancher, CMA   03/24/2023   After Visit Summary: (MyChart) Due to this being a telephonic visit, the after visit summary with patients personalized plan was offered to patient via MyChart   Nurse Notes: Patient is due for a colonoscopy.  She is up to date with all other health maintenance with no concerns to address today.     +

## 2023-03-24 NOTE — Patient Instructions (Addendum)
Miranda Thompson , Thank you for taking time to come for your Medicare Wellness Visit. I appreciate your ongoing commitment to your health goals. Please review the following plan we discussed and let me know if I can assist you in the future.   Referrals/Orders/Follow-Ups/Clinician Recommendations: It was nice to speak with you this morning.  You are due for a colonoscopy.  Please call [facility], [address], [place], [place] [address], at [phone], to schedule an appointment.  Keep up the good work.  This is a list of the screening recommended for you and due dates:  Health Maintenance  Topic Date Due   Colon Cancer Screening  08/08/2019   COVID-19 Vaccine (7 - 2024-25 season) 02/09/2023   Medicare Annual Wellness Visit  03/23/2024   DTaP/Tdap/Td vaccine (2 - Td or Tdap) 05/08/2024   Pneumonia Vaccine  Completed   Flu Shot  Completed   DEXA scan (bone density measurement)  Completed   Zoster (Shingles) Vaccine  Completed   HPV Vaccine  Aged Out    Advanced directives: (Copy Requested) Please bring a copy of your health care power of attorney and living will to the office to be added to your chart at your convenience.  Next Medicare Annual Wellness Visit scheduled for next year: Yes

## 2023-03-25 ENCOUNTER — Ambulatory Visit (INDEPENDENT_AMBULATORY_CARE_PROVIDER_SITE_OTHER): Payer: Medicare PPO | Admitting: Obstetrics and Gynecology

## 2023-03-25 ENCOUNTER — Encounter: Payer: Self-pay | Admitting: Obstetrics and Gynecology

## 2023-03-25 ENCOUNTER — Ambulatory Visit (INDEPENDENT_AMBULATORY_CARE_PROVIDER_SITE_OTHER): Payer: Medicare PPO

## 2023-03-25 VITALS — BP 120/68 | HR 75

## 2023-03-25 DIAGNOSIS — N83202 Unspecified ovarian cyst, left side: Secondary | ICD-10-CM | POA: Diagnosis not present

## 2023-03-25 DIAGNOSIS — Z853 Personal history of malignant neoplasm of breast: Secondary | ICD-10-CM | POA: Diagnosis not present

## 2023-03-25 DIAGNOSIS — R971 Elevated cancer antigen 125 [CA 125]: Secondary | ICD-10-CM | POA: Diagnosis not present

## 2023-03-25 DIAGNOSIS — N949 Unspecified condition associated with female genital organs and menstrual cycle: Secondary | ICD-10-CM | POA: Diagnosis not present

## 2023-03-25 DIAGNOSIS — Z8049 Family history of malignant neoplasm of other genital organs: Secondary | ICD-10-CM | POA: Diagnosis not present

## 2023-03-25 DIAGNOSIS — Z712 Person consulting for explanation of examination or test findings: Secondary | ICD-10-CM

## 2023-03-25 NOTE — Progress Notes (Signed)
89 y.o. y.o. female here for PUS Denies any pelvic pain  Patient's last menstrual period was 02/23/1985.    H/O breast cancer.    FH uterine cancer in her daughter with genetic mutation in MSH6, patient is negative for this gene    Patient's last menstrual period was 02/23/1985.          Sexually active: No.  The current method of family planning is post menopausal status.    Exercising: Yes.    Walking weights and bike  Smoker:  no  Health Maintenance: Pap:   06/24/17 WNL History of abnormal Pap:  no MMG:  11/05/22 Bi-rads 2 benign  BMD:   11/05/22 T -1.7, 2022 -1.20, 2019 -1.3 .  Counseled on Evenity and or prolia.  Has reflux with a twisted esophagus and cannot take fosamax.  Information provided on both.  Colonoscopy: 08/08/2014 polyps, no f/u recommended  TDaP:  05/09/14  Gardasil: N/A  Hip ct scan with possible constipation but recommended PUS to evaluate  PUS today with  6.26 anteverted uterus Atrophic in size and shape No myometrial masses  Thin symmetrical endometrium measuring 1.14mm  RO not see LO 4.1 x 3.4cm simple avascular cyst No adnexal masses No free fluid  There is no height or weight on file to calculate BMI.     03/24/2023    8:24 AM 01/08/2023    3:08 PM 10/22/2022    1:16 PM  Depression screen PHQ 2/9  Decreased Interest 0 0 0  Down, Depressed, Hopeless 3 0 0  PHQ - 2 Score 3 0 0  Altered sleeping 0    Tired, decreased energy 1    Change in appetite 0    Feeling bad or failure about yourself  0    Trouble concentrating 0    Moving slowly or fidgety/restless 0    Suicidal thoughts 0    PHQ-9 Score 4    Difficult doing work/chores Not difficult at all      Blood pressure 120/68, pulse 75, last menstrual period 02/23/1985, SpO2 97%.     Component Value Date/Time   DIAGPAP  06/24/2017 0000    NEGATIVE FOR INTRAEPITHELIAL LESIONS OR MALIGNANCY.   ADEQPAP  06/24/2017 0000    Satisfactory for evaluation  endocervical/transformation  zone component PRESENT.    GYN HISTORY:    Component Value Date/Time   DIAGPAP  06/24/2017 0000    NEGATIVE FOR INTRAEPITHELIAL LESIONS OR MALIGNANCY.   ADEQPAP  06/24/2017 0000    Satisfactory for evaluation  endocervical/transformation zone component PRESENT.    OB History  Gravida Para Term Preterm AB Living  5 5 5   5   SAB IAB Ectopic Multiple Live Births      5    # Outcome Date GA Lbr Len/2nd Weight Sex Type Anes PTL Lv  5 Term     M Vag-Spont   LIV  4 Term     F Vag-Spont   LIV  3 Term     M Vag-Spont   LIV  2 Term     F    LIV  1 Term     F Vag-Spont   LIV    Past Medical History:  Diagnosis Date   Adenomatous colon polyp    Allergic rhinitis    Anemia    pt states no anemia in her past hx thst she is aware of    Atrial fibrillation (HCC)    goes in and out of this rhythm-  takes Tenormin   Breast cancer, left breast (HCC) 1994 with lumpectomy   chemo,tamox,radiation   Bronchiectasis with acute exacerbation (HCC)    COPD (chronic obstructive pulmonary disease) (HCC)    Coronary artery disease    Diverticulosis of colon    DJD (degenerative joint disease)    Dysrhythmia    palpitations   Family history of brain cancer    Family history of colon cancer 02/14/2018   Family history of colon cancer    Family history of malignant neoplasm of gastrointestinal tract    Family history of melanoma    Family history of uterine cancer    Fibromyalgia    GERD (gastroesophageal reflux disease) 02/14/2018   Glaucoma    Hemoptysis    History of pneumonia    Hypercholesteremia    pt denies   Hypertension    Low back pain syndrome    Neuromuscular disorder (HCC)    hx fibromyalgia    Osteopenia    Pneumonia    hx   Pseudomonas infection    Rotator cuff syndrome of right shoulder    Spinal stenosis     Past Surgical History:  Procedure Laterality Date   CATARACT EXTRACTION Right    COLONOSCOPY     left breast lumpectomy and LN dissection  06/1992   Dr.  Ezzard Standing   LUNG BIOPSY  1968   TSurg---Dr. Bascom Levels   LUNG BIOPSY  2012   dr Kriste Basque   POLYPECTOMY     REVERSE SHOULDER ARTHROPLASTY Right 08/02/2015   Procedure: RIGHT REVERSE TOTAL SHOULDER ARTHROPLASTY;  Surgeon: Beverely Low, MD;  Location: [facility] OR;  Service: Orthopedics;  Laterality: Right;   REVERSE TOTAL SHOULDER ARTHROPLASTY Right 08/02/2015   TONSILLECTOMY     TOTAL KNEE ARTHROPLASTY Right 01/21/2015   Procedure: TOTAL RIGHT KNEE ARTHROPLASTY;  Surgeon: Durene Romans, MD;  Location: [facility] ORS;  Service: Orthopedics;  Laterality: Right;   TOTAL KNEE ARTHROPLASTY Left 03/23/2016   Procedure: LEFT TOTAL KNEE ARTHROPLASTY;  Surgeon: Durene Romans, MD;  Location: [facility] ORS;  Service: Orthopedics;  Laterality: Left;    Current Outpatient Medications on File Prior to Visit  Medication Sig Dispense Refill   albuterol (PROVENTIL) (2.5 MG/3ML) 0.083% nebulizer solution Take 3 mLs (2.5 mg total) by nebulization every 6 (six) hours as needed for wheezing or shortness of breath. 75 mL 12   albuterol (VENTOLIN HFA) 108 (90 Base) MCG/ACT inhaler TAKE 2 PUFFS BY MOUTH EVERY 6 HOURS AS NEEDED FOR WHEEZE OR SHORTNESS OF BREATH 8.5 each 6   ASPIRIN 81 PO Take 1 tablet by mouth daily.     Calcium Carb-Cholecalciferol 600-800 MG-UNIT TABS Take 1 tablet by mouth daily.     diltiazem (CARDIZEM CD) 120 MG 24 hr capsule TAKE 1 CAPSULE BY MOUTH EVERY DAY 90 capsule 3   guaiFENesin (MUCINEX) 600 MG 12 hr tablet Take by mouth 2 (two) times daily.     losartan (COZAAR) 25 MG tablet TAKE 0.5 TABLETS BY MOUTH DAILY. PLEASE KEEP YOUR UPCOMING APPOINTMENT FOR FUTURE REFILLS. 45 tablet 2   LUMIGAN 0.01 % SOLN      Multiple Vitamins-Minerals (MULTIVITAMIN & MINERAL PO) Take 1 tablet by mouth daily.     Respiratory Therapy Supplies (FLUTTER) DEVI 1 application by Does not apply route 3 (three) times daily. 1 each 0   rosuvastatin (CRESTOR) 5 MG tablet Take 1 tablet (5 mg total) by mouth daily. 90 tablet 3   Saccharomyces boulardii  (FLORASTOR PO) Take 1 capsule by mouth daily.  sodium chloride 0.9 % nebulizer solution Take 3 mLs by nebulization 2 (two) times daily as needed (cough/congestion). 90 mL 12   Tiotropium Bromide-Olodaterol (STIOLTO RESPIMAT) 2.5-2.5 MCG/ACT AERS Inhale 2 puffs into the lungs daily. 12 g 1   VITAMIN D PO Take 4,000 Int'l Units by mouth.     Zinc 30 MG CAPS      nitroGLYCERIN (NITROSTAT) 0.4 MG SL tablet Place 1 tablet (0.4 mg total) under the tongue every 5 (five) minutes as needed for chest pain. Please call [phone] to make appointment with Dr. Tenny Craw for future refills. (Patient not taking: Reported on 03/25/2023) 25 tablet 0   No current facility-administered medications on file prior to visit.    Social History   Socioeconomic History   Marital status: Married    Spouse name: Zollie Beckers   Number of children: 5   Years of education: Not on file   Highest education level: Not on file  Occupational History   Occupation: Retired  Tobacco Use   Smoking status: Former    Current packs/day: 0.00    Types: Cigarettes    Quit date: 02/24/1967    Years since quitting: 56.1   Smokeless tobacco: Never  Vaping Use   Vaping status: Never Used  Substance and Sexual Activity   Alcohol use: Yes    Comment: occasionally   Drug use: No   Sexual activity: Not Currently    Partners: Male    Birth control/protection: Post-menopausal    Comment: husband vasectomy, older than 16, less than 5  Other Topics Concern   Not on file  Social History Narrative   Lives with husband.   Social Drivers of Corporate investment banker Strain: Low Risk  (03/24/2023)   Overall Financial Resource Strain (CARDIA)    Difficulty of Paying Living Expenses: Not hard at all  Food Insecurity: No Food Insecurity (03/24/2023)   Hunger Vital Sign    Worried About Running Out of Food in the Last Year: Never true    Ran Out of Food in the Last Year: Never true  Transportation Needs: No Transportation Needs (03/24/2023)    PRAPARE - Administrator, Civil Service (Medical): No    Lack of Transportation (Non-Medical): No  Physical Activity: Sufficiently Active (03/24/2023)   Exercise Vital Sign    Days of Exercise per Week: 5 days    Minutes of Exercise per Session: 50 min  Stress: No Stress Concern Present (03/24/2023)   [employer] of Occupational Health - Occupational Stress Questionnaire    Feeling of Stress : Only a little  Social Connections: Moderately Isolated (03/24/2023)   Social Connection and Isolation Panel [NHANES]    Frequency of Communication with Friends and Family: More than three times a week    Frequency of Social Gatherings with Friends and Family: More than three times a week    Attends Religious Services: Never    Database administrator or Organizations: No    Attends Banker Meetings: Never    Marital Status: Married  Catering manager Violence: Not At Risk (03/24/2023)   Humiliation, Afraid, Rape, and Kick questionnaire    Fear of Current or Ex-Partner: No    Emotionally Abused: No    Physically Abused: No    Sexually Abused: No    Family History  Problem Relation Age of Onset   Heart attack Mother        Deceased age 89   Colon cancer Father 23  deceased age 1 from colon ca.   Hypertension Brother    Colon polyps Daughter    Uterine cancer Daughter 50   Brain cancer Maternal Uncle    Stroke Maternal Grandmother    Other Paternal Uncle 12       possible cancer   Melanoma Daughter 31   Melanoma Niece 53       d. 15   Esophageal cancer Neg Hx    Stomach cancer Neg Hx    Rectal cancer Neg Hx      No Known Allergies    Patient's last menstrual period was Patient's last menstrual period was 02/23/1985.Marland Kitchen            Review of Systems Alls systems reviewed and are negative.         A:         left ovarian simple cyst, PUS results today                             P:         Asymptomatic cyst with simple appearance.   Discussed usually benign nature of simple cysts.  To verify with ca125.  She reports cyst was seen on MRI and CT in the past.  To return with any pain or concerns and with annual exams.   15 minutes spent on reviewing records, imaging,  and one on one patient time and counseling patient and documentation Dr. Karma Greaser  No follow-ups on file.  Earley Favor

## 2023-03-26 LAB — CA 125: CA 125: 37 U/mL — ABNORMAL HIGH (ref <35)

## 2023-03-29 ENCOUNTER — Encounter: Payer: Self-pay | Admitting: Obstetrics and Gynecology

## 2023-03-29 DIAGNOSIS — N83202 Unspecified ovarian cyst, left side: Secondary | ICD-10-CM

## 2023-03-29 NOTE — Telephone Encounter (Signed)
Patients daughter Raynelle Fanning left message requesting return call to discuss results and recommendations.   Call returned to patient, advised messages forwarded to Dr. Karma Greaser, our office will f/u once results reviewed. Patient appreciative of call.

## 2023-03-29 NOTE — Telephone Encounter (Addendum)
 Patient called to discuss ca125 results. With her history of breast, she would like consult with gynonc possible surgery. Referral placed Dr. Karma Greaser

## 2023-04-02 ENCOUNTER — Telehealth: Payer: Self-pay

## 2023-04-02 NOTE — Telephone Encounter (Signed)
 Spoke with Miranda Thompson regarding her referral to GYN oncology. She has an appointment scheduled with Dr. Eldonna on 04/26/23 at 11:15. Patient agrees to date and time. She has been provided with office address and location. She is also aware of our mask and visitor policy. Patient verbalized understanding and will call with any questions.

## 2023-04-05 NOTE — Telephone Encounter (Signed)
 Appeal decision letter received from [facility] and states, "based on a new independent review, the decision for this appeal is UNFAVORABLE. We decided that the Plan does not have to pre-approve Evenity."  Routing to provider to review and advise.

## 2023-04-07 ENCOUNTER — Encounter: Payer: Self-pay | Admitting: Obstetrics and Gynecology

## 2023-04-08 ENCOUNTER — Ambulatory Visit: Payer: Medicare PPO | Admitting: Pulmonary Disease

## 2023-04-09 NOTE — Telephone Encounter (Signed)
Called pt after received the following email:  Hi again Miranda Thompson,   I forgot to mention that on February 7, (a Saturday of course), starting in the morning until evening and with a very heavy amount of production all day, I had blood ranging from bright red to dark red, no phlegm at all, just blood.  I decided I would wait to see how things looked on Sunday since I could not save a specimen until Monday in any case.  On Sunday, I was back to just phlegm, ranging from light to dark green, good production all day Sunday.  Back to normal Monday.   Just thought you should know and be sure that the CT scan is of the lungs.   Have a lovely weekend.   Miranda Thompson 03/241937   She reports that she only had hemoptysis described above on 04/02/23 and never had increased SOB, fever, CP or other symptoms. She was producing a tsp at a time of blood "all day".  Feels well today and says her breathing and cough are at her normal baseline. Routing to [employer].

## 2023-04-09 NOTE — Telephone Encounter (Signed)
Her CT is showing up as CT chest on my end on 2/19. I would have her verify with radiology that this is what they have down. It could be that it's scheduled near the GI imaging office so sometimes it shows up as this. It's just the location; not the scan.  As for the cough, if back to normal, keep monitoring. If she has any more bloody sputum, she needs to go to the ED. If the sputum becomes more colored again but no blood, call for abx. Keep using nebs/flutter/mucinex. Thanks!

## 2023-04-13 ENCOUNTER — Ambulatory Visit: Payer: Medicare PPO | Admitting: Obstetrics and Gynecology

## 2023-04-14 ENCOUNTER — Other Ambulatory Visit: Payer: Medicare PPO

## 2023-04-20 ENCOUNTER — Ambulatory Visit
Admission: RE | Admit: 2023-04-20 | Discharge: 2023-04-20 | Disposition: A | Discharge status: Home / Self Care | Payer: Medicare PPO | Source: Ambulatory Visit | Attending: Nurse Practitioner | Admitting: Nurse Practitioner

## 2023-04-20 DIAGNOSIS — J479 Bronchiectasis, uncomplicated: Secondary | ICD-10-CM

## 2023-04-20 DIAGNOSIS — I2510 Atherosclerotic heart disease of native coronary artery without angina pectoris: Secondary | ICD-10-CM | POA: Diagnosis not present

## 2023-04-20 DIAGNOSIS — J849 Interstitial pulmonary disease, unspecified: Secondary | ICD-10-CM | POA: Diagnosis not present

## 2023-04-21 ENCOUNTER — Ambulatory Visit: Payer: Medicare PPO | Admitting: Obstetrics and Gynecology

## 2023-04-22 ENCOUNTER — Encounter: Payer: Self-pay | Admitting: Psychiatry

## 2023-04-22 DIAGNOSIS — J471 Bronchiectasis with (acute) exacerbation: Secondary | ICD-10-CM | POA: Diagnosis not present

## 2023-04-26 ENCOUNTER — Ambulatory Visit: Payer: Medicare PPO | Admitting: Nurse Practitioner

## 2023-04-26 ENCOUNTER — Other Ambulatory Visit

## 2023-04-26 ENCOUNTER — Encounter: Payer: Self-pay | Admitting: Nurse Practitioner

## 2023-04-26 ENCOUNTER — Inpatient Hospital Stay: Payer: Medicare PPO | Attending: Psychiatry | Admitting: Psychiatry

## 2023-04-26 ENCOUNTER — Encounter: Payer: Self-pay | Admitting: Psychiatry

## 2023-04-26 VITALS — BP 136/62 | HR 75 | Ht 62.0 in | Wt 126.6 lb

## 2023-04-26 VITALS — BP 133/70 | HR 79 | Temp 98.1°F | Resp 19 | Ht 62.0 in | Wt 126.8 lb

## 2023-04-26 DIAGNOSIS — E7800 Pure hypercholesterolemia, unspecified: Secondary | ICD-10-CM | POA: Insufficient documentation

## 2023-04-26 DIAGNOSIS — Z800 Family history of malignant neoplasm of digestive organs: Secondary | ICD-10-CM | POA: Insufficient documentation

## 2023-04-26 DIAGNOSIS — Z7982 Long term (current) use of aspirin: Secondary | ICD-10-CM | POA: Insufficient documentation

## 2023-04-26 DIAGNOSIS — Z808 Family history of malignant neoplasm of other organs or systems: Secondary | ICD-10-CM | POA: Insufficient documentation

## 2023-04-26 DIAGNOSIS — H409 Unspecified glaucoma: Secondary | ICD-10-CM | POA: Insufficient documentation

## 2023-04-26 DIAGNOSIS — I4891 Unspecified atrial fibrillation: Secondary | ICD-10-CM | POA: Insufficient documentation

## 2023-04-26 DIAGNOSIS — R140 Abdominal distension (gaseous): Secondary | ICD-10-CM | POA: Insufficient documentation

## 2023-04-26 DIAGNOSIS — R918 Other nonspecific abnormal finding of lung field: Secondary | ICD-10-CM | POA: Diagnosis not present

## 2023-04-26 DIAGNOSIS — G709 Myoneural disorder, unspecified: Secondary | ICD-10-CM | POA: Insufficient documentation

## 2023-04-26 DIAGNOSIS — I2510 Atherosclerotic heart disease of native coronary artery without angina pectoris: Secondary | ICD-10-CM | POA: Insufficient documentation

## 2023-04-26 DIAGNOSIS — M1990 Unspecified osteoarthritis, unspecified site: Secondary | ICD-10-CM | POA: Diagnosis not present

## 2023-04-26 DIAGNOSIS — Z853 Personal history of malignant neoplasm of breast: Secondary | ICD-10-CM | POA: Diagnosis not present

## 2023-04-26 DIAGNOSIS — Z860101 Personal history of adenomatous and serrated colon polyps: Secondary | ICD-10-CM | POA: Diagnosis not present

## 2023-04-26 DIAGNOSIS — R194 Change in bowel habit: Secondary | ICD-10-CM | POA: Insufficient documentation

## 2023-04-26 DIAGNOSIS — I10 Essential (primary) hypertension: Secondary | ICD-10-CM | POA: Insufficient documentation

## 2023-04-26 DIAGNOSIS — J479 Bronchiectasis, uncomplicated: Secondary | ICD-10-CM | POA: Diagnosis not present

## 2023-04-26 DIAGNOSIS — Z8049 Family history of malignant neoplasm of other genital organs: Secondary | ICD-10-CM | POA: Diagnosis not present

## 2023-04-26 DIAGNOSIS — Z79899 Other long term (current) drug therapy: Secondary | ICD-10-CM | POA: Insufficient documentation

## 2023-04-26 DIAGNOSIS — K219 Gastro-esophageal reflux disease without esophagitis: Secondary | ICD-10-CM | POA: Diagnosis not present

## 2023-04-26 DIAGNOSIS — Z87891 Personal history of nicotine dependence: Secondary | ICD-10-CM

## 2023-04-26 DIAGNOSIS — J449 Chronic obstructive pulmonary disease, unspecified: Secondary | ICD-10-CM | POA: Insufficient documentation

## 2023-04-26 DIAGNOSIS — N83202 Unspecified ovarian cyst, left side: Secondary | ICD-10-CM | POA: Insufficient documentation

## 2023-04-26 DIAGNOSIS — R971 Elevated cancer antigen 125 [CA 125]: Secondary | ICD-10-CM | POA: Diagnosis not present

## 2023-04-26 DIAGNOSIS — Z2239 Carrier of other specified bacterial diseases: Secondary | ICD-10-CM | POA: Diagnosis not present

## 2023-04-26 DIAGNOSIS — M797 Fibromyalgia: Secondary | ICD-10-CM | POA: Insufficient documentation

## 2023-04-26 DIAGNOSIS — M8580 Other specified disorders of bone density and structure, unspecified site: Secondary | ICD-10-CM | POA: Insufficient documentation

## 2023-04-26 DIAGNOSIS — R978 Other abnormal tumor markers: Secondary | ICD-10-CM | POA: Diagnosis not present

## 2023-04-26 NOTE — Patient Instructions (Signed)
 It was a pleasure to see you in clinic today. - We discussed obtaining a pelvic MRI. - We will have you repeat the CA125 prior to seeing me in follow-up - Return visit planned for after your MRI.  Thank you very much for allowing me to provide care for you today.  I appreciate your confidence in choosing our Gynecologic Oncology team at [facility].  If you have any questions about your visit today please call our office or send [place] a MyChart message and we will get back to you as soon as possible.

## 2023-04-26 NOTE — Progress Notes (Signed)
 GYNECOLOGIC ONCOLOGY NEW PATIENT CONSULTATION  Date of Service: 04/26/2023 Referring Provider: Arman Filter, MD   ASSESSMENT AND PLAN: Miranda Thompson is a 89 y.o. woman with 3-4cm simple left ovarian cyst, mildly elevated CA125.  Reviewed imaging and lab results.  Reviewed that this cyst appears to have been present as far back as 2012 and more recently seen on MRI in 2022.  Slight increase in size over the 10+ years but overall no concerning features on imaging including no evidence of ascites, lymphadenopathy or carcinomatosis.  Based on that, I suspect that this is a benign process.  Reviewed that if a cyst had been present for this long and was a malignancy we would most likely see one of the above findings by this point.  Additionally, we did review that elevation in CA125 can in some cases be suggestive of a cancer but CA125 can also be elevated in the setting of other inflammatory processes including inflammation of the bowel and also can occur with inflammation in the pleural cavity.  If the CA125 was normal, I would suggest no further follow-up.  However, given the mild elevation in CA125, I think it is reasonable to perform some additional workup to reassure [place].  I recommend an MRI pelvis to further characterize the ovarian cyst.  Can repeat the CA125 at a time to assess for trend.  Will have patient follow-up after completion of the MRI.  If overall reassuring, we can discuss likely close observation at that time.  If overtly concerning, we will have to discuss possible surgical intervention.  If indeterminant, we can discuss further the pros and cons of close observation versus surgery.  All questions answered to their satisfaction.  Follow-up after MRI.  A copy of this note was sent to the patient's referring provider.  Clide Cliff, MD Gynecologic Oncology   Medical Decision Making I personally spent  TOTAL 45 minutes face-to-face and non-face-to-face in the care of  this patient, which includes all pre, intra, and post visit time on the date of service.   ------------  CC: ovarian cyst  HISTORY OF PRESENT ILLNESS:  Miranda Thompson is a 89 y.o. woman who is seen in consultation at the request of Arman Filter, MD for evaluation of ovarian cyst.  Patient presented to her OB/GYN 03/25/2023 for a pelvic ultrasound.  She had previously undergone a CT abdomen/pelvis for hip pain which noted constipation but also a 3.6 cm simple cyst of the left adnexa.  On ultrasound, her right ovary was not seen and her left ovary was noted to have a 4.1 x 3.4 cm simple avascular cyst, no free fluid.  A CA125 was collected and was mildly elevated at 37.  On review of imaging, the cyst has been there since at least 03/31/20, on MRI lumbar spine, measuring 3.2cm. A left ovarian cyst has been noted as far back at 05/07/10 on CT abdomen pelvis, noted at that time with benign features and measuring 2.5cm.  Patient has a personal history of breast cancer.  Her daughter also has a history of endometrial cancer with MSH6 mutation.  The patient is negative for this gene mutation.  Today patient presents with her daughter and husband.  She endorses a history as above.  She reports a change in her bowels and feeling of bloating and distention.  She is to have regular soft bowel movements but now occasionally has firm small round stools.  Patient does have a history of glaucoma and follows with an  eye doctor.  She reports surgery on her right eye for this.  She also follows with pulmonology and has follow-up this afternoon to review her CT chest results from 04/20/2023 which has changes noted.   PAST MEDICAL HISTORY: Past Medical History:  Diagnosis Date   Adenomatous colon polyp    Allergic rhinitis    Anemia    pt states no anemia in her past hx thst she is aware of    Atrial fibrillation (HCC)    goes in and out of this rhythm- takes Tenormin   Breast cancer, left breast (HCC)  1994 with lumpectomy   chemo,tamox,radiation   Bronchiectasis with acute exacerbation (HCC)    COPD (chronic obstructive pulmonary disease) (HCC)    Coronary artery disease    Diverticulosis of colon    DJD (degenerative joint disease)    Dysrhythmia    palpitations   Family history of brain cancer    Family history of colon cancer 02/14/2018   Family history of colon cancer    Family history of malignant neoplasm of gastrointestinal tract    Family history of melanoma    Family history of uterine cancer    Fibromyalgia    GERD (gastroesophageal reflux disease) 02/14/2018   Glaucoma    Hemoptysis    History of pneumonia    Hypercholesteremia    pt denies   Hypertension    Low back pain syndrome    Neuromuscular disorder (HCC)    hx fibromyalgia    Osteopenia    Pneumonia    hx   Pseudomonas infection    Rotator cuff syndrome of right shoulder    Spinal stenosis     PAST SURGICAL HISTORY: Past Surgical History:  Procedure Laterality Date   CATARACT EXTRACTION Right    COLONOSCOPY     left breast lumpectomy and LN dissection  06/1992   Dr. Ezzard Standing   LUNG BIOPSY  1968   TSurg---Dr. Bascom Levels   LUNG BIOPSY  2012   dr Kriste Basque   POLYPECTOMY     REVERSE SHOULDER ARTHROPLASTY Right 08/02/2015   Procedure: RIGHT REVERSE TOTAL SHOULDER ARTHROPLASTY;  Surgeon: Beverely Low, MD;  Location: [facility] OR;  Service: Orthopedics;  Laterality: Right;   REVERSE TOTAL SHOULDER ARTHROPLASTY Right 08/02/2015   TONSILLECTOMY     TOTAL KNEE ARTHROPLASTY Right 01/21/2015   Procedure: TOTAL RIGHT KNEE ARTHROPLASTY;  Surgeon: Durene Romans, MD;  Location: [facility];  Service: Orthopedics;  Laterality: Right;   TOTAL KNEE ARTHROPLASTY Left 03/23/2016   Procedure: LEFT TOTAL KNEE ARTHROPLASTY;  Surgeon: Durene Romans, MD;  Location: [facility];  Service: Orthopedics;  Laterality: Left;    OB/GYN HISTORY: OB History  Gravida Para Term Preterm AB Living  5 5 5   5   SAB IAB Ectopic Multiple Live Births      5     # Outcome Date GA Lbr Len/2nd Weight Sex Type Anes PTL Lv  5 Term     M Vag-Spont   LIV  4 Term     F Vag-Spont   LIV  3 Term     M Vag-Spont   LIV  2 Term     F Vag-Spont   LIV  1 Term     F Vag-Spont   LIV      Age at menarche: 41 Age at menopause: 72 Hx of HRT: yes, about 2 years Hx of STI: no Last pap: 2020 History of abnormal pap smears: no  SCREENING STUDIES:  Last mammogram: 09/2022  Last colonoscopy: 2016  MEDICATIONS:  Current Outpatient Medications:    albuterol (PROVENTIL) (2.5 MG/3ML) 0.083% nebulizer solution, Take 3 mLs (2.5 mg total) by nebulization every 6 (six) hours as needed for wheezing or shortness of breath., Disp: 75 mL, Rfl: 12   albuterol (VENTOLIN HFA) 108 (90 Base) MCG/ACT inhaler, TAKE 2 PUFFS BY MOUTH EVERY 6 HOURS AS NEEDED FOR WHEEZE OR SHORTNESS OF BREATH, Disp: 8.5 each, Rfl: 6   ASPIRIN 81 PO, Take 1 tablet by mouth daily., Disp: , Rfl:    Calcium Carb-Cholecalciferol 600-800 MG-UNIT TABS, Take 1 tablet by mouth daily., Disp: , Rfl:    diltiazem (CARDIZEM CD) 120 MG 24 hr capsule, TAKE 1 CAPSULE BY MOUTH EVERY DAY, Disp: 90 capsule, Rfl: 3   guaiFENesin (MUCINEX) 600 MG 12 hr tablet, Take by mouth 2 (two) times daily., Disp: , Rfl:    losartan (COZAAR) 25 MG tablet, TAKE 0.5 TABLETS BY MOUTH DAILY. PLEASE KEEP YOUR UPCOMING APPOINTMENT FOR FUTURE REFILLS., Disp: 45 tablet, Rfl: 2   LUMIGAN 0.01 % SOLN, , Disp: , Rfl:    Multiple Vitamins-Minerals (MULTIVITAMIN & MINERAL PO), Take 1 tablet by mouth daily., Disp: , Rfl:    Respiratory Therapy Supplies (FLUTTER) DEVI, 1 application by Does not apply route 3 (three) times daily., Disp: 1 each, Rfl: 0   rosuvastatin (CRESTOR) 5 MG tablet, Take 1 tablet (5 mg total) by mouth daily., Disp: 90 tablet, Rfl: 3   Saccharomyces boulardii (FLORASTOR PO), Take 1 capsule by mouth daily., Disp: , Rfl:    sodium chloride 0.9 % nebulizer solution, Take 3 mLs by nebulization 2 (two) times daily as needed  (cough/congestion)., Disp: 90 mL, Rfl: 12   Tiotropium Bromide-Olodaterol (STIOLTO RESPIMAT) 2.5-2.5 MCG/ACT AERS, Inhale 2 puffs into the lungs daily., Disp: 12 g, Rfl: 1   TURMERIC CURCUMIN PO, Take by mouth., Disp: , Rfl:    VITAMIN D PO, Take 4,000 Int'l Units by mouth., Disp: , Rfl:    Zinc 30 MG CAPS, , Disp: , Rfl:    amoxicillin (AMOXIL) 500 MG capsule, SMARTSIG:3 Capsule(s) By Mouth (Patient not taking: Reported on 04/22/2023), Disp: , Rfl:    nitroGLYCERIN (NITROSTAT) 0.4 MG SL tablet, Place 1 tablet (0.4 mg total) under the tongue every 5 (five) minutes as needed for chest pain. Please call [phone] to make appointment with Dr. Tenny Craw for future refills. (Patient not taking: Reported on 04/22/2023), Disp: 25 tablet, Rfl: 0  ALLERGIES: No Known Allergies  FAMILY HISTORY: Family History  Problem Relation Age of Onset   Heart attack Mother        Deceased age 91   Colon cancer Father 33       deceased age 73 from colon ca.   Hypertension Brother    Stroke Maternal Grandmother    Colon polyps Daughter    Uterine cancer Daughter 34   Melanoma Daughter 28   Brain cancer Maternal Uncle    Other Paternal Uncle 12       possible cancer   Melanoma Niece 78       d. 70   Esophageal cancer Neg Hx    Stomach cancer Neg Hx    Rectal cancer Neg Hx    Ovarian cancer Neg Hx    Prostate cancer Neg Hx    Breast cancer Neg Hx     SOCIAL HISTORY: Social History   Socioeconomic History   Marital status: Married    Spouse name: Zollie Beckers   Number of children: 5  Years of education: Not on file   Highest education level: Not on file  Occupational History   Occupation: Retired  Tobacco Use   Smoking status: Former    Current packs/day: 0.00    Types: Cigarettes    Quit date: 02/24/1967    Years since quitting: 56.2   Smokeless tobacco: Never  Vaping Use   Vaping status: Never Used  Substance and Sexual Activity   Alcohol use: Yes    Comment: occasionally   Drug use: No    Sexual activity: Not Currently    Partners: Male    Birth control/protection: Post-menopausal    Comment: husband vasectomy, older than 16, less than 5  Other Topics Concern   Not on file  Social History Narrative   Lives with husband.   Social Drivers of Corporate investment banker Strain: Low Risk  (03/24/2023)   Overall Financial Resource Strain (CARDIA)    Difficulty of Paying Living Expenses: Not hard at all  Food Insecurity: No Food Insecurity (03/24/2023)   Hunger Vital Sign    Worried About Running Out of Food in the Last Year: Never true    Ran Out of Food in the Last Year: Never true  Transportation Needs: No Transportation Needs (03/24/2023)   PRAPARE - Administrator, Civil Service (Medical): No    Lack of Transportation (Non-Medical): No  Physical Activity: Sufficiently Active (03/24/2023)   Exercise Vital Sign    Days of Exercise per Week: 5 days    Minutes of Exercise per Session: 50 min  Stress: No Stress Concern Present (03/24/2023)   [employer] of Occupational Health - Occupational Stress Questionnaire    Feeling of Stress : Only a little  Social Connections: Moderately Isolated (03/24/2023)   Social Connection and Isolation Panel [NHANES]    Frequency of Communication with Friends and Family: More than three times a week    Frequency of Social Gatherings with Friends and Family: More than three times a week    Attends Religious Services: Never    Database administrator or Organizations: No    Attends Banker Meetings: Never    Marital Status: Married  Catering manager Violence: Not At Risk (03/24/2023)   Humiliation, Afraid, Rape, and Kick questionnaire    Fear of Current or Ex-Partner: No    Emotionally Abused: No    Physically Abused: No    Sexually Abused: No    REVIEW OF SYSTEMS: New patient intake form was reviewed.  Complete 10-system review is negative except for the following: Appetite change, shortness of breath,  palpitations, urinary frequency, joint pain, bruising, neck lump, wheezing, abdominal distention, pelvic pain, back pain, anxiety, fatigue, chest pain, abdominal pain, cramping, weight loss, cough  PHYSICAL EXAM: BP 133/70 (BP Location: Left Arm, Patient Position: Sitting)   Pulse 79   Temp 98.1 F (36.7 C) (Oral)   Resp 19   Ht 5\' 2"  (1.575 m)   Wt 126 lb 12.8 oz (57.5 kg)   LMP 02/23/1985   SpO2 99%   BMI 23.19 kg/m  Constitutional: No acute distress. Neuro/Psych: Alert, oriented.  Head and Neck: Normocephalic, atraumatic. Neck symmetric without masses. Sclera anicteric.  Respiratory: Normal work of breathing. Few scattered wheezes in bilateral lung bases otherwise clear to auscultation bilaterally. Cardiovascular: Regular rate and rhythm, no murmurs, rubs, or gallops. Abdomen: Normoactive bowel sounds. Soft, non-tender to palpation. No masses appreciated.  Extremities: Grossly normal range of motion. Warm, well perfused. No edema bilaterally.  Skin: No rashes or lesions. Lymphatic: No cervical, supraclavicular, or inguinal adenopathy. Genitourinary: External genitalia without lesions. Urethral meatus without lesions or prolapse. On speculum exam, vagina and cervix without lesions. Cervix flush at apex. Bimanual exam reveals normal cervix and small uterus. Mild fullness of left adnexa. No nodularity. Exam chaperoned by Warner Mccreedy, NP   LABORATORY AND RADIOLOGIC DATA: Outside medical records were reviewed to synthesize the above history, along with the history and physical obtained during the visit.  Outside laboratory, pathology, and imaging reports were reviewed, with pertinent results below.  I personally reviewed the outside images.  WBC  Date Value Ref Range Status  10/22/2022 6.7 4.0 - 10.5 K/uL Final   Hemoglobin  Date Value Ref Range Status  10/22/2022 12.5 12.0 - 15.0 g/dL Final  02/25/7251 66.4 11.1 - 15.9 g/dL Final   HCT  Date Value Ref Range Status  10/22/2022  37.2 36.0 - 46.0 % Final   Hematocrit  Date Value Ref Range Status  04/10/2021 35.8 34.0 - 46.6 % Final   Platelets  Date Value Ref Range Status  10/22/2022 272.0 150.0 - 400.0 K/uL Final  04/10/2021 252 150 - 450 x10E3/uL Final   Magnesium  Date Value Ref Range Status  12/02/2020 2.0 1.5 - 2.5 mg/dL Final   Creatinine, Ser  Date Value Ref Range Status  10/22/2022 0.63 0.40 - 1.20 mg/dL Final   AST  Date Value Ref Range Status  02/23/2023 29 0 - 40 IU/L Final   ALT  Date Value Ref Range Status  02/23/2023 16 0 - 32 IU/L Final   CA 125  Date Value Ref Range Status  03/25/2023 37 (H) <35 U/mL Final    Comment:    . This test was performed using the Siemens  Chemiluminescent method. Values obtained from different assay methods cannot be used  interchangeably. CA 125 levels, regardless of  value, should not be interpreted as absolute evidence of the presence or absence of disease. .    Diagnosis  Date Value Ref Range Status  06/24/2017   Final   NEGATIVE FOR INTRAEPITHELIAL LESIONS OR MALIGNANCY.    US PELVIC COMPLETE WITH TRANSVAGINAL 03/25/2023  Narrative 6.26cm anteverted uterus Atrophic in size and shape No myometrial masses  Thin symmetrical endometrium measuring 1.25mm  RO not see LO 4.1 x 3.42cm simple avascular cyst No adnexal masses No free fluid  CT ABDOMEN PELVIS W CONTRAST 10/28/2022  Narrative CLINICAL DATA:  Right lower quadrant abdominal pain. Right sided abdominal mass palpated on physical exam.  EXAM: CT ABDOMEN AND PELVIS WITH CONTRAST  TECHNIQUE: Multidetector CT imaging of the abdomen and pelvis was performed using the standard protocol following bolus administration of intravenous contrast.  RADIATION DOSE REDUCTION: This exam was performed according to the departmental dose-optimization program which includes automated exposure control, adjustment of the mA and/or kV according to patient size and/or use of iterative  reconstruction technique.  CONTRAST:  ISOVUE-300 IOPAMIDOL (ISOVUE-300) INJECTION 61%  COMPARISON:  CT of the abdomen and pelvis 05/07/2010  FINDINGS: Lower chest: The lung bases are clear without focal nodule, mass, or airspace disease. The heart size is normal. No significant pleural or pericardial effusion is present.  Hepatobiliary: No focal liver abnormality is seen. No gallstones, gallbladder wall thickening, or biliary dilatation.  Pancreas: Unremarkable. No pancreatic ductal dilatation or surrounding inflammatory changes.  Spleen: Normal in size without focal abnormality.  Adrenals/Urinary Tract: Adrenal glands are unremarkable. Kidneys are normal, without renal calculi, focal lesion, or hydronephrosis. Bladder  is unremarkable.  Stomach/Bowel: The stomach and duodenum are within normal limits. Small bowel is unremarkable. The terminal ileum is normal. Appendix is not discretely visualized and may be surgically absent. No inflammatory changes are present in the region.  A stool filled cecum is mildly dilated measuring up to 8.5 cm. No inflammatory changes or mass lesion is present. The ascending colon is otherwise within normal limits. Transverse colon is unremarkable. The descending and sigmoid colon are within normal limits.  Vascular/Lymphatic: Atherosclerotic calcifications are present in the aorta and branch vessels. No aneurysm is present. No significant adenopathy is present.  Reproductive: A 3.6 cm simple cyst is present in the left adnexa. The uterus and adnexa are otherwise within normal limits.  Other: No abdominal wall hernia or abnormality. No abdominopelvic ascites.  Musculoskeletal: S shaped scoliosis of the lumbar spine is noted with asymmetric degenerative changes on the right at L2-3 and on the left at L4-5 and L5-S1. Degenerative changes are present at the SI joints bilaterally. No focal osseous lesions are present. The hips are located.  Mild degenerative changes are noted bilaterally, right greater than left.  IMPRESSION: 1. Moderate stool in the ascending colon likely accounts for the palpable mass. No other focal lesion is present. No inflammatory change or mass lesion is associated. 2. 3.6 cm simple cyst in the left adnexa. Recommend follow-up pelvic ultrasound in 6-12 months. Reference: JACR 2020 Feb;17(2):248-254 3. S shaped scoliosis of the lumbar spine with asymmetric degenerative changes on the right at L2-3 and on the left at L4-5 and L5-S1. 4.  Aortic Atherosclerosis (ICD10-I70.0).   Electronically Signed By: Marin Roberts M.D. On: 10/28/2022 17:34

## 2023-04-26 NOTE — Progress Notes (Signed)
 @Patient  ID: Miranda Thompson, female    DOB: 08-20-1933, 89 y.o.   MRN: [id number]  Chief Complaint  Patient presents with   Follow-up    Breathing has improved with neb use. She states she is wheezing less.     Referring provider: Corwin Levins, MD  HPI: 89 year old female, former smoker followed for COPD, bronchiectasis, and lung nodules. She is a former patient of Dr. Myrlene Broker and last seen in office on 03/12/2023 by [facility] NP. Past medical history significant for atherosclerosis, GERD, OA, HLD.   TEST/EVENTS:  10/10/2021 echocardiogram: EF 66 5%.  Normal diastolic parameters.  RV size and function normal.  Normal PASP.  Trivial MR. 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  Small hiatal hernia.  Patulous esophagus.  No LAD.  Bilateral bronchiectasis, more pronounced in the lingula.  Scattered solid pulmonary nodules some of which demonstrate central cystic change.  Pulmonary findings are not significantly changed when compared to previous exam. 01/07/2022 ONO: 4 min <88%, SpO2 low 85% with average 93.4% 04/20/2023 CT chest: atherosclerosis. Extensive chronic interstitial lung disease with extensive LUL fibro atelectatic and btx changes without evidence of acute infiltrates or consolidations of LUL. Similar but less prominent bronchiectatic changes in RML and LLL. Several small subpleural pulmonary nodules between 3 and 5 mm; inflammatory or postinflammatory nodular changes in RML and RUL. Postsurgical changes in left breast with several metallic densities.   01/01/2021: OV with Dr. Tonia Brooms. Here today for follow up after CT scan of the chest. CT completed in October 2022 with varicoid btx and a few cysts within the lungs. Other scattered small nodules. No significant change. Plan to repeat in 2-3 years. Breathing doesn't limit activity. Does use flutter valve occasionally. Not much sputum production. Doing well with stiolto.    01/02/2022: OV with Leonell Lobdell NP for follow-up to discuss CT scan results.  Her bronchiectasis and lung nodules were stable when compared to previous imaging.  No increased size or progression of disease noted.  She does have an enlarged right pulmonary artery; however, echocardiogram from August 2023 was without any right heart strain and with normal pulmonary artery pressures.  Today, she tells me that she has had some trouble with her breathing over the last 6 to 8 months.  Feels like she had a slow decline.  Gets winded with simple tasks around the house such as changing the bed sheets.  She does also have some associated palpitations, feeling like her heart is fluttering, and dizziness.  She is being evaluated by cardiology for concern for A-fib.  She just completed wearing a heart monitor this week.  Has not received the results from this yet.  No increased cough or chest congestion.  She denies any wheezing, fevers, night sweats, mops this, weight gain or loss, lower extremity swelling, orthopnea, PND.  No episodes of syncope or chest pain.  She is currently on Stiolto.  Uses guaifenesin and flutter valve as needed for chest congestion.    04/02/2022: OV with Daniyal Tabor NP for follow up with her husband. Her daughter is also on the phone during our visit. She tells me that she is feeling unchanged compared to when she was here last. She still has fatigue and gets more short winded with activities around the house, especially changing the bed. She does have some associated palpitations; workup with cardiology has been unremarkable. Occasionally has some associated palpitations. At this point, symptoms have been  ongoing for the last year. She doesn't feel any worse today. No increased cough or chest congestion. Denies fevers, chills, hemoptysis, night sweats. Eating and drinking well. She doesn't use her rescue inhaler often. She is compliant with Stiolto and uses guaifenesin and flutter as needed.  She did start  using a dental paste with fluoride in it and thinks she left it on for too long the other night. She also didn't rinse it off like she normally does. She has a few sores in her mouth now, which are all very small and white/red. Denies any associated symptoms.   10/12/2022: OV with Dr. Tonia Brooms. Overall doing well. Uses stiolto. Had a CT scan in October with stable btx. No exacerbations. With ongoing back issues, likely not a great candidate for vest therapy.   03/12/2023: OV with Martina Brodbeck NP. Discussed the use of AI scribe software for clinical note transcription with the patient, who gave verbal consent to proceed.  The patient, accompanied by her husband, presents for acute visit. She reported feeling unwell since November, with symptoms including a tight chest, a persistent cough, and significant fatigue. The patient described the cough as severe, lasting up to 20 minutes at a time, and initially unproductive. She then started developing increased phlegm production that was green. She also reported a low-grade fever. She went to her PCP 11/21 who prescribed levaquin and prednisone with improvement in symptoms. She had tested negative for COVID-19. Her husband was also sick at the time.  About a week or two ago, symptoms returned. She was coughing up sputum streaked with blood, but this has since resolved. The patient also reported shortness of breath and audible wheezing, which has improved as well but not completely resolved. The cough is less frequent and productive but still sometimes gets up green to yellow phlegm. She does feel like her breathing is at her baseline for the most part. No fevers, chills. Eating and drinking well. The patient has a history of bronchiectasis and has been managing her condition with albuterol and Stiolto inhalers, as well as Mucinex once a day. She also reported using a flutter valve, but not on a daily basis. She does not have a nebulizer.  Despite her current symptoms, the  patient believes she is on the upward trend of recovery.   04/26/2023: Today - follow up Patient presents today for follow up with her daughter and husband. Cough and breathing have improved. She feels like the nebs have made a huge difference and seem to be helping. Sputum production is yellow in color but comes up easily. This is baseline for her. She's not had any more hemoptysis. She denies any wheezing, fevers, chills. Eating and drinking well. Using [employer] daily. Doing her nebs 1-2 times a day. Taking guaifenesin and flutter valve.  She did have a recent CT chest that showed a few small, new nodules, which are likely infectious/inflammatory in nature, as well as extensive chronic bronchiectasis.   No Known Allergies  Immunization History  Administered Date(s) Administered   Fluad Quad(high Dose 65+) 11/08/2019, 11/14/2020, 11/19/2021   Influenza Split 12/03/2011   Influenza Whole 11/30/2007, 11/30/2008, 12/03/2009, 11/24/2010   Influenza, High Dose Seasonal PF 11/19/2015, 12/02/2016, 12/17/2017, 11/22/2018   Influenza,inj,Quad PF,6+ Mos 11/10/2012, 11/08/2013, 11/06/2014   Influenza-Unspecified 12/02/2016, 11/22/2018, 11/14/2021, 12/15/2022   PFIZER Comirnaty(Gray Top)Covid-19 Tri-Sucrose Vaccine 05/27/2020   PFIZER(Purple Top)SARS-COV-2 Vaccination 03/14/2019, 04/04/2019, 11/18/2019   Pfizer(Comirnaty)Fall Seasonal Vaccine 12 years and older 11/14/2021   Pneumococcal Conjugate-13 05/09/2014   Pneumococcal Polysaccharide-23  11/07/2007   Tdap 05/09/2014   Unspecified SARS-COV-2 Vaccination 12/15/2022   Zoster Recombinant(Shingrix) 04/10/2018, 01/08/2019   Zoster, Live 12/11/2009    Past Medical History:  Diagnosis Date   Adenomatous colon polyp    Allergic rhinitis    Anemia    pt states no anemia in her past hx thst she is aware of    Atrial fibrillation (HCC)    goes in and out of this rhythm- takes Tenormin   Breast cancer, left breast (HCC) 1994 with lumpectomy    chemo,tamox,radiation   Bronchiectasis with acute exacerbation (HCC)    COPD (chronic obstructive pulmonary disease) (HCC)    Coronary artery disease    Diverticulosis of colon    DJD (degenerative joint disease)    Dysrhythmia    palpitations   Family history of brain cancer    Family history of colon cancer 02/14/2018   Family history of colon cancer    Family history of malignant neoplasm of gastrointestinal tract    Family history of melanoma    Family history of uterine cancer    Fibromyalgia    GERD (gastroesophageal reflux disease) 02/14/2018   Glaucoma    Hemoptysis    History of pneumonia    Hypercholesteremia    pt denies   Hypertension    Low back pain syndrome    Neuromuscular disorder (HCC)    hx fibromyalgia    Osteopenia    Pneumonia    hx   Pseudomonas infection    Rotator cuff syndrome of right shoulder    Spinal stenosis     Tobacco History: Social History   Tobacco Use  Smoking Status Former   Current packs/day: 0.00   Types: Cigarettes   Quit date: 02/24/1967   Years since quitting: 56.2  Smokeless Tobacco Never   Counseling given: Not Answered   Outpatient Medications Prior to Visit  Medication Sig Dispense Refill   albuterol (PROVENTIL) (2.5 MG/3ML) 0.083% nebulizer solution Take 3 mLs (2.5 mg total) by nebulization every 6 (six) hours as needed for wheezing or shortness of breath. 75 mL 12   albuterol (VENTOLIN HFA) 108 (90 Base) MCG/ACT inhaler TAKE 2 PUFFS BY MOUTH EVERY 6 HOURS AS NEEDED FOR WHEEZE OR SHORTNESS OF BREATH 8.5 each 6   ASPIRIN 81 PO Take 1 tablet by mouth daily.     Calcium Carb-Cholecalciferol 600-800 MG-UNIT TABS Take 1 tablet by mouth daily.     diltiazem (CARDIZEM CD) 120 MG 24 hr capsule TAKE 1 CAPSULE BY MOUTH EVERY DAY 90 capsule 3   guaiFENesin (MUCINEX) 600 MG 12 hr tablet Take by mouth 2 (two) times daily.     losartan (COZAAR) 25 MG tablet TAKE 0.5 TABLETS BY MOUTH DAILY. PLEASE KEEP YOUR UPCOMING APPOINTMENT FOR  FUTURE REFILLS. 45 tablet 2   LUMIGAN 0.01 % SOLN      Multiple Vitamins-Minerals (MULTIVITAMIN & MINERAL PO) Take 1 tablet by mouth daily.     nitroGLYCERIN (NITROSTAT) 0.4 MG SL tablet Place 1 tablet (0.4 mg total) under the tongue every 5 (five) minutes as needed for chest pain. Please call [phone] to make appointment with Dr. Tenny Craw for future refills. (Patient not taking: Reported on 04/22/2023) 25 tablet 0   Respiratory Therapy Supplies (FLUTTER) DEVI 1 application by Does not apply route 3 (three) times daily. 1 each 0   rosuvastatin (CRESTOR) 5 MG tablet Take 1 tablet (5 mg total) by mouth daily. 90 tablet 3   Saccharomyces boulardii (FLORASTOR PO) Take 1  capsule by mouth daily.     sodium chloride 0.9 % nebulizer solution Take 3 mLs by nebulization 2 (two) times daily as needed (cough/congestion). 90 mL 12   Tiotropium Bromide-Olodaterol (STIOLTO RESPIMAT) 2.5-2.5 MCG/ACT AERS Inhale 2 puffs into the lungs daily. 12 g 1   VITAMIN D PO Take 4,000 Int'l Units by mouth.     Zinc 30 MG CAPS      No facility-administered medications prior to visit.     Review of Systems:   Constitutional: No weight loss or gain, night sweats, fevers, chills +fatigue, lassitude (improved) HEENT: No headaches, difficulty swallowing, tooth/dental problems, or sore throat. No sneezing, itching, ear ache, nasal congestion, or post nasal drip CV: No chest pain, orthopnea, PND, swelling in lower extremities, anasarca, syncope, dizziness, palpitations.  Resp: +shortness of breath with exertion (improved); productive cough (improved). No wheeze. No excess mucus or change in color of mucus. No hemoptysis. No chest wall deformity GI:  No heartburn, indigestion, abdominal pain, nausea, vomiting, diarrhea, change in bowel habits, loss of appetite, bloody stools.  Skin: No rash, lesions, ulcerations MSK:  No joint pain or swelling.  No decreased range of motion.  +chronic back pain. Neuro: No dizziness or  lightheadedness.  Psych: No depression or anxiety. Mood stable.     Physical Exam:  BP 136/62 (BP Location: Right Arm, Cuff Size: Normal)   Pulse 75   Ht 5\' 2"  (1.575 m)   Wt 126 lb 9.6 oz (57.4 kg)   LMP 02/23/1985   SpO2 97%   BMI 23.16 kg/m   GEN: Pleasant, interactive, well-appearing; elderly; in no acute distress. HEENT:  Normocephalic and atraumatic. PERRLA. Sclera white. Nasal turbinates pink, moist and patent bilaterally. No rhinorrhea present. Oropharynx pink and moist, without exudate or edema. No lesions, ulcerations, or postnasal drip.  NECK:  Supple w/ fair ROM. No JVD present. Normal carotid impulses w/o bruits. Thyroid symmetrical with no goiter or nodules palpated. No lymphadenopathy.   CV: RRR, no m/r/g, no peripheral edema. Pulses intact, +2 bilaterally. No cyanosis, pallor or clubbing. PULMONARY:  Unlabored, regular breathing. Diminished bilaterally A&P w/o wheezes/rales/rhonchi. No accessory muscle use.  GI: BS present and normoactive. Soft, non-tender to palpation. No organomegaly or masses detected.  MSK: No erythema, warmth or tenderness. Cap refil <2 sec all extrem. No deformities or joint swelling noted.  Neuro: A/Ox3. No focal deficits noted.   Skin: Warm, no lesions or rashe Psych: Normal affect and behavior. Judgement and thought content appropriate.     Lab Results:  CBC    Component Value Date/Time   WBC 6.7 10/22/2022 1402   RBC 3.79 (L) 10/22/2022 1402   HGB 12.5 10/22/2022 1402   HGB 12.3 04/10/2021 1538   HCT 37.2 10/22/2022 1402   HCT 35.8 04/10/2021 1538   PLT 272.0 10/22/2022 1402   PLT 252 04/10/2021 1538   MCV 98.1 10/22/2022 1402   MCV 96 04/10/2021 1538   MCH 32.9 04/10/2021 1538   MCH 32.3 10/11/2016 1131   MCHC 33.5 10/22/2022 1402   RDW 12.6 10/22/2022 1402   RDW 11.4 (L) 04/10/2021 1538   LYMPHSABS 1.2 10/22/2022 1402   MONOABS 0.6 10/22/2022 1402   EOSABS 0.2 10/22/2022 1402   BASOSABS 0.1 10/22/2022 1402    BMET     Component Value Date/Time   NA 129 (L) 10/22/2022 1402   NA 132 (L) 10/01/2021 1127   K 4.0 10/22/2022 1402   CL 94 (L) 10/22/2022 1402   CO2 29 10/22/2022  1402   GLUCOSE 88 10/22/2022 1402   BUN 14 10/22/2022 1402   BUN 13 10/01/2021 1127   CREATININE 0.63 10/22/2022 1402   CALCIUM 9.4 10/22/2022 1402   GFRNONAA >60 10/11/2016 1131   GFRAA >60 10/11/2016 1131    BNP No results found for: "BNP"   Imaging:  CT Chest Wo Contrast Result Date: 04/20/2023 CLINICAL DATA:  Chronic cough EXAM: CT CHEST WITHOUT CONTRAST TECHNIQUE: Multidetector CT imaging of the chest was performed following the standard protocol without IV contrast. RADIATION DOSE REDUCTION: This exam was performed according to the departmental dose-optimization program which includes automated exposure control, adjustment of the mA and/or kV according to patient size and/or use of iterative reconstruction technique. COMPARISON:  Chest x-rays March 12, 2023 FINDINGS: Cardiovascular: No significant vascular findings. Normal heart size. No pericardial effusion. Moderate coronary artery calcifications. Mediastinum/Nodes: No obvious mediastinal masses or adenopathy although there is an enlarged main pulmonary artery and left main pulmonary artery that could correlate with pulmonary arterial hypertension. The evaluation of the mediastinum is significant limited by the lack of nodule mediastinal fat and lack of IV contrast with significantly enlarged bilateral pulmonary arteries. Lungs/Pleura: There is extensive chronic interstitial lung disease with extensive left upper lobe fibro atelectatic and bronchiectatic changes without evidence of acute infiltrates or consolidations of the left upper lobe. There are similar but less prominent bronchiectatic changes of the right middle lobe and left lower lobe posteriorly. There are several small subpleural pulmonary nodules between 3 and 5 mm bilaterally at the level of both lower lobes. As well  as several inflammatory or postinflammatory pulmonary nodular changes of the right middle lobe and right upper lobe. Measuring between 3 and 5 mm. Fibro atelectatic changes of the left upper lobe with retraction of the hilum and mediastinum to the left upper lobe is present. Postsurgical changes of the left breast with several metallic densities indicating postsurgical clips. Correlate clinically for radiation therapy as the patient has history of breast cancer in the left breast in 1994 and the findings of the left upper lobe could be related to post radiation therapy changes. Upper Abdomen: No acute abnormality. Musculoskeletal: No chest wall mass or suspicious bone lesions identified. IMPRESSION: *Extensive chronic interstitial lung disease with extensive fibro atelectatic and bronchiectatic changes of the left upper lobe with retraction of the hilum and mediastinum to the left upper lobe. *Several small subpleural pulmonary nodules between 3 and 5 mm bilaterally at the level of both lower lobes. As well as several inflammatory or postinflammatory pulmonary nodular changes of the right middle lobe and right upper lobe. *Postsurgical changes of the left breast with several metallic densities indicating postsurgical clips. Correlate clinically for radiation therapy as the patient has history of breast cancer in the left breast in 1994 and the findings of the left upper lobe could be related to post radiation therapy changes. *Moderate coronary artery calcifications. *No obvious mediastinal masses or adenopathy although there is an enlarged main pulmonary artery and left main pulmonary artery that could correlate with pulmonary arterial hypertension. The evaluation of the mediastinum is significant limited by the lack of nodule mediastinal fat and lack of IV contrast with significantly enlarged bilateral pulmonary arteries. Electronically Signed   By: Shaaron Adler M.D.   On: 04/20/2023 15:04    Administration  History     None           No data to display          No results found  for: "NITRICOXIDE"      Assessment & Plan:     Pseudomonas aeruginosa colonization Positive pseudomonas culture, intermediate to cipro and levofloxacin. Last abx course of levaquin in November 2024. Her infectious symptoms mostly resolved following this. She still produces some phlegm but it has significantly improved. She's had positive cultures in the past. No superimposed infection on imaging. Suspect she is colonized. Will hold off on further abx. She will notify if she develops new/worsening symptoms. Advised to continue mucociliary clearance therapies.   Patient Instructions   Continue Albuterol inhaler 2 puffs or 3 mL neb every 6 hours as needed for shortness of breath or wheezing. Notify if symptoms persist despite rescue inhaler/neb use.  Continue Stiolto 2 puffs daily Continue guaifenesin 600 mg 1-2 daily for chest congestion Continue flutter valve 2-3 times a day for chest congestion    When you are having chest congestion, productive cough, chest tightness, wheezing, increased shortness of breath... -Use the albuterol breathing treatment twice a day followed by a saline nebulizer 3 mL treatment twice a day then your flutter valve 10 times after the neb  -You will use your Stiolto in the morning after your treatment  -You should also increase mucinex to twice a day if you are having thick phlegm or more chest congestion that feels more difficult to clear   Repeat CT chest in 5 months  You had some new, small nodules which are likely mucus plugging or inflammatory but we will repeat to keep an eye on them    Follow up in 3 months with Katie Jowanda Heeg,NP then in 6 months (after CT) with Dr. Francine Graven (new pt 30 min slot). If symptoms do not improve or worsen, please contact office for sooner follow up or seek emergency care.     Bronchiectasis (HCC) Extensive disease. See above. Continue mucociliary  clearance regimen.   Lung nodules Few scattered, small nodules (3-5 mm) in lower lobes. Will repeat CT chest in 6 months to ensure stability/resolution.    I spent 35 minutes of dedicated to the care of this patient on the date of this encounter to include pre-visit review of records, face-to-face time with the patient discussing conditions above, post visit ordering of testing, clinical documentation with the electronic health record, making appropriate referrals as documented, and communicating necessary findings to members of the patients care team.  Noemi Chapel, NP 04/29/2023  Pt aware and understands NP's role.

## 2023-04-26 NOTE — Patient Instructions (Addendum)
 Continue Albuterol inhaler 2 puffs or 3 mL neb every 6 hours as needed for shortness of breath or wheezing. Notify if symptoms persist despite rescue inhaler/neb use.  Continue Stiolto 2 puffs daily Continue guaifenesin 600 mg 1-2 daily for chest congestion Continue flutter valve 2-3 times a day for chest congestion    When you are having chest congestion, productive cough, chest tightness, wheezing, increased shortness of breath... -Use the albuterol breathing treatment twice a day followed by a saline nebulizer 3 mL treatment twice a day then your flutter valve 10 times after the neb  -You will use your Stiolto in the morning after your treatment  -You should also increase mucinex to twice a day if you are having thick phlegm or more chest congestion that feels more difficult to clear   Repeat CT chest in 5 months  You had some new, small nodules which are likely mucus plugging or inflammatory but we will repeat to keep an eye on them    Follow up in 3 months with Katie Pristine Gladhill,NP then in 6 months (after CT) with Dr. Francine Graven (new pt 30 min slot). If symptoms do not improve or worsen, please contact office for sooner follow up or seek emergency care.

## 2023-04-29 DIAGNOSIS — Z2239 Carrier of other specified bacterial diseases: Secondary | ICD-10-CM | POA: Insufficient documentation

## 2023-04-29 DIAGNOSIS — R918 Other nonspecific abnormal finding of lung field: Secondary | ICD-10-CM | POA: Insufficient documentation

## 2023-04-29 NOTE — Assessment & Plan Note (Addendum)
 Few scattered, small nodules (3-5 mm) in lower lobes. Will repeat CT chest in 6 months to ensure stability/resolution.

## 2023-04-29 NOTE — Assessment & Plan Note (Signed)
 Positive pseudomonas culture, intermediate to cipro and levofloxacin. Last abx course of levaquin in November 2024. Her infectious symptoms mostly resolved following this. She still produces some phlegm but it has significantly improved. She's had positive cultures in the past. No superimposed infection on imaging. Suspect she is colonized. Will hold off on further abx. She will notify if she develops new/worsening symptoms. Advised to continue mucociliary clearance therapies.   Patient Instructions   Continue Albuterol inhaler 2 puffs or 3 mL neb every 6 hours as needed for shortness of breath or wheezing. Notify if symptoms persist despite rescue inhaler/neb use.  Continue Stiolto 2 puffs daily Continue guaifenesin 600 mg 1-2 daily for chest congestion Continue flutter valve 2-3 times a day for chest congestion    When you are having chest congestion, productive cough, chest tightness, wheezing, increased shortness of breath... -Use the albuterol breathing treatment twice a day followed by a saline nebulizer 3 mL treatment twice a day then your flutter valve 10 times after the neb  -You will use your Stiolto in the morning after your treatment  -You should also increase mucinex to twice a day if you are having thick phlegm or more chest congestion that feels more difficult to clear   Repeat CT chest in 5 months  You had some new, small nodules which are likely mucus plugging or inflammatory but we will repeat to keep an eye on them    Follow up in 3 months with Miranda Stalin Gruenberg,Miranda Thompson then in 6 months (after CT) with Miranda Thompson (new pt 30 min slot). If symptoms do not improve or worsen, please contact office for sooner follow up or seek emergency care.

## 2023-04-29 NOTE — Assessment & Plan Note (Signed)
 Extensive disease. See above. Continue mucociliary clearance regimen.

## 2023-04-30 ENCOUNTER — Encounter: Payer: Self-pay | Admitting: Nurse Practitioner

## 2023-05-04 ENCOUNTER — Inpatient Hospital Stay

## 2023-05-04 ENCOUNTER — Ambulatory Visit (HOSPITAL_COMMUNITY)
Admission: RE | Admit: 2023-05-04 | Discharge: 2023-05-04 | Disposition: A | Discharge status: Home / Self Care | Source: Ambulatory Visit | Attending: Psychiatry | Admitting: Psychiatry

## 2023-05-04 DIAGNOSIS — R971 Elevated cancer antigen 125 [CA 125]: Secondary | ICD-10-CM | POA: Diagnosis not present

## 2023-05-04 DIAGNOSIS — N83202 Unspecified ovarian cyst, left side: Secondary | ICD-10-CM

## 2023-05-04 DIAGNOSIS — H409 Unspecified glaucoma: Secondary | ICD-10-CM | POA: Diagnosis not present

## 2023-05-04 DIAGNOSIS — R194 Change in bowel habit: Secondary | ICD-10-CM | POA: Diagnosis not present

## 2023-05-04 DIAGNOSIS — I2510 Atherosclerotic heart disease of native coronary artery without angina pectoris: Secondary | ICD-10-CM | POA: Diagnosis not present

## 2023-05-04 DIAGNOSIS — N83292 Other ovarian cyst, left side: Secondary | ICD-10-CM | POA: Diagnosis not present

## 2023-05-04 DIAGNOSIS — J449 Chronic obstructive pulmonary disease, unspecified: Secondary | ICD-10-CM | POA: Diagnosis not present

## 2023-05-04 DIAGNOSIS — I4891 Unspecified atrial fibrillation: Secondary | ICD-10-CM | POA: Diagnosis not present

## 2023-05-04 DIAGNOSIS — R978 Other abnormal tumor markers: Secondary | ICD-10-CM | POA: Insufficient documentation

## 2023-05-04 DIAGNOSIS — M1990 Unspecified osteoarthritis, unspecified site: Secondary | ICD-10-CM | POA: Diagnosis not present

## 2023-05-04 DIAGNOSIS — R140 Abdominal distension (gaseous): Secondary | ICD-10-CM | POA: Diagnosis not present

## 2023-05-04 DIAGNOSIS — K5730 Diverticulosis of large intestine without perforation or abscess without bleeding: Secondary | ICD-10-CM | POA: Diagnosis not present

## 2023-05-04 MED ORDER — GADOBUTROL 1 MMOL/ML IV SOLN
5.0000 mL | Freq: Once | INTRAVENOUS | Status: AC | PRN
  Administered 2023-05-04: 5 mL via INTRAVENOUS

## 2023-05-05 LAB — CA 125: Cancer Antigen (CA) 125: 54 U/mL — ABNORMAL HIGH (ref 0.0–38.1)

## 2023-05-10 ENCOUNTER — Encounter: Payer: Self-pay | Admitting: Psychiatry

## 2023-05-17 ENCOUNTER — Inpatient Hospital Stay (HOSPITAL_BASED_OUTPATIENT_CLINIC_OR_DEPARTMENT_OTHER): Admitting: Psychiatry

## 2023-05-17 VITALS — BP 134/66 | HR 88 | Temp 97.8°F | Resp 20 | Wt 127.0 lb

## 2023-05-17 DIAGNOSIS — N83202 Unspecified ovarian cyst, left side: Secondary | ICD-10-CM

## 2023-05-17 DIAGNOSIS — J449 Chronic obstructive pulmonary disease, unspecified: Secondary | ICD-10-CM | POA: Diagnosis not present

## 2023-05-17 DIAGNOSIS — I2510 Atherosclerotic heart disease of native coronary artery without angina pectoris: Secondary | ICD-10-CM | POA: Diagnosis not present

## 2023-05-17 DIAGNOSIS — R194 Change in bowel habit: Secondary | ICD-10-CM | POA: Diagnosis not present

## 2023-05-17 DIAGNOSIS — R971 Elevated cancer antigen 125 [CA 125]: Secondary | ICD-10-CM

## 2023-05-17 DIAGNOSIS — R978 Other abnormal tumor markers: Secondary | ICD-10-CM

## 2023-05-17 DIAGNOSIS — R140 Abdominal distension (gaseous): Secondary | ICD-10-CM | POA: Diagnosis not present

## 2023-05-17 DIAGNOSIS — I4891 Unspecified atrial fibrillation: Secondary | ICD-10-CM | POA: Diagnosis not present

## 2023-05-17 DIAGNOSIS — H409 Unspecified glaucoma: Secondary | ICD-10-CM | POA: Diagnosis not present

## 2023-05-17 DIAGNOSIS — M1990 Unspecified osteoarthritis, unspecified site: Secondary | ICD-10-CM | POA: Diagnosis not present

## 2023-05-17 NOTE — Progress Notes (Signed)
 Gynecologic Oncology Return Clinic Visit  Date of Service: 05/17/2023 Referring Provider: Arman Filter, MD   Assessment & Plan: Miranda Thompson is a 89 y.o. woman with an ORADS 2 4.7cm simple left adnexal cyst with mildly elevated CA125 who presents for follow-up.  Reviewed workup to date.  Reviewed MRI results which show a 4.7 cm simple left adnexal cysts, O RADS 2 which estimates a risk of malignancy of less than 1%.  Reviewed that this MRI result is very reassuring.  We had also repeated her CA125 which was slightly increased from 37-54.  But these labs were collected at different laboratory locations and thus may have a slight variation.  So unclear at this time the significance of this.    Additionally, she has been following closely with pulmonology and has significant changes on her CT chest most recently on 04/20/2023 with extensive chronic interstitial lung disease and extensive fibro atelectatic and bronchiectatic changes.  We did review that the inflammatory changes in the lungs can also mildly elevate the CA125, so it is unclear at this time if that is part of the driving force of her mild elevation of CA125 given the very reassuring MRI.  Given all the above as well as her lung disease, we discussed the pros and cons of definitive surgical diagnosis.  At this time, I favor close observation given the very reassuring MRI and to avoid risk of anesthesia and intubation given her interstitial lung disease.  Patient and her daughter are strongly in agreement with this.  We discussed that since it has been about 6 months since her prior imaging with overall stability, we can repeat imaging in another 6 months.  Discussed pros and cons of various imaging modalities.  I recommend a CT abdomen/pelvis so we can get a more global view to ensure generally stable size of mass and no new peritoneal or lymph node findings.  Pending the imaging we can consider a repeat CA125 at that time as well  but we will get the imaging first to weigh the pros and cons.  Patient also has 19-month CT chest imaging scheduled so we may be able to coordinate those imaging to occur at the same time.  RTC 39mo following CT scan.  Clide Cliff, MD Gynecologic Oncology   Medical Decision Making I personally spent  TOTAL 35 minutes face-to-face and non-face-to-face in the care of this patient, which includes all pre, intra, and post visit time on the date of service.   ----------------------- Reason for Visit: Follow-up  Treatment History: Patient presented to her OB/GYN 03/25/2023 for a pelvic ultrasound.  She had previously undergone a CT abdomen/pelvis for hip pain which noted constipation but also a 3.6 cm simple cyst of the left adnexa.  On ultrasound, her right ovary was not seen and her left ovary was noted to have a 4.1 x 3.4 cm simple avascular cyst, no free fluid.  A CA125 was collected and was mildly elevated at 37.   On review of imaging, the cyst has been there since at least 03/31/20, on MRI lumbar spine, measuring 3.2cm. A left ovarian cyst has been noted as far back at 05/07/10 on CT abdomen pelvis, noted at that time with benign features and measuring 2.5cm.  Patient does have a history of glaucoma and follows with an eye doctor. She reports surgery on her right eye for this. She also follows with pulmonology and has follow-up this afternoon to review her CT chest results from 04/20/2023 which has  changes noted.   Interval History: Today, patient presents again with her daughter.  No new symptoms since we saw each other last.  No pain.   Past Medical/Surgical History: Past Medical History:  Diagnosis Date   Adenomatous colon polyp    Allergic rhinitis    Anemia    pt states no anemia in her past hx thst she is aware of    Atrial fibrillation (HCC)    goes in and out of this rhythm- takes Tenormin   Breast cancer, left breast (HCC) 1994 with lumpectomy   chemo,tamox,radiation    Bronchiectasis with acute exacerbation (HCC)    COPD (chronic obstructive pulmonary disease) (HCC)    Coronary artery disease    Diverticulosis of colon    DJD (degenerative joint disease)    Dysrhythmia    palpitations   Family history of brain cancer    Family history of colon cancer 02/14/2018   Family history of colon cancer    Family history of malignant neoplasm of gastrointestinal tract    Family history of melanoma    Family history of uterine cancer    Fibromyalgia    GERD (gastroesophageal reflux disease) 02/14/2018   Glaucoma    Hemoptysis    History of pneumonia    Hypercholesteremia    pt denies   Hypertension    Low back pain syndrome    Neuromuscular disorder (HCC)    hx fibromyalgia    Osteopenia    Pneumonia    hx   Pseudomonas infection    Rotator cuff syndrome of right shoulder    Spinal stenosis     Past Surgical History:  Procedure Laterality Date   CATARACT EXTRACTION Right    COLONOSCOPY     left breast lumpectomy and LN dissection  06/1992   Dr. Ezzard Standing   LUNG BIOPSY  1968   TSurg---Dr. Bascom Levels   LUNG BIOPSY  2012   dr Kriste Basque   POLYPECTOMY     REVERSE SHOULDER ARTHROPLASTY Right 08/02/2015   Procedure: RIGHT REVERSE TOTAL SHOULDER ARTHROPLASTY;  Surgeon: Beverely Low, MD;  Location: [facility] OR;  Service: Orthopedics;  Laterality: Right;   REVERSE TOTAL SHOULDER ARTHROPLASTY Right 08/02/2015   TONSILLECTOMY     TOTAL KNEE ARTHROPLASTY Right 01/21/2015   Procedure: TOTAL RIGHT KNEE ARTHROPLASTY;  Surgeon: Durene Romans, MD;  Location: [facility] ORS;  Service: Orthopedics;  Laterality: Right;   TOTAL KNEE ARTHROPLASTY Left 03/23/2016   Procedure: LEFT TOTAL KNEE ARTHROPLASTY;  Surgeon: Durene Romans, MD;  Location: [facility] ORS;  Service: Orthopedics;  Laterality: Left;    Family History  Problem Relation Age of Onset   Heart attack Mother        Deceased age 64   Colon cancer Father 62       deceased age 56 from colon ca.   Hypertension Brother    Stroke  Maternal Grandmother    Colon polyps Daughter    Uterine cancer Daughter 17   Melanoma Daughter 35   Brain cancer Maternal Uncle    Other Paternal Uncle 12       possible cancer   Melanoma Niece 51       d. 10   Esophageal cancer Neg Hx    Stomach cancer Neg Hx    Rectal cancer Neg Hx    Ovarian cancer Neg Hx    Prostate cancer Neg Hx    Breast cancer Neg Hx     Social History   Socioeconomic History   Marital status:  Married    Spouse name: Zollie Beckers   Number of children: 5   Years of education: Not on file   Highest education level: Not on file  Occupational History   Occupation: Retired  Tobacco Use   Smoking status: Former    Current packs/day: 0.00    Types: Cigarettes    Quit date: 02/24/1967    Years since quitting: 56.2   Smokeless tobacco: Never  Vaping Use   Vaping status: Never Used  Substance and Sexual Activity   Alcohol use: Yes    Comment: occasionally   Drug use: No   Sexual activity: Not Currently    Partners: Male    Birth control/protection: Post-menopausal    Comment: husband vasectomy, older than 16, less than 5  Other Topics Concern   Not on file  Social History Narrative   Lives with husband.   Social Drivers of Corporate investment banker Strain: Low Risk  (03/24/2023)   Overall Financial Resource Strain (CARDIA)    Difficulty of Paying Living Expenses: Not hard at all  Food Insecurity: No Food Insecurity (03/24/2023)   Hunger Vital Sign    Worried About Running Out of Food in the Last Year: Never true    Ran Out of Food in the Last Year: Never true  Transportation Needs: No Transportation Needs (03/24/2023)   PRAPARE - Administrator, Civil Service (Medical): No    Lack of Transportation (Non-Medical): No  Physical Activity: Sufficiently Active (03/24/2023)   Exercise Vital Sign    Days of Exercise per Week: 5 days    Minutes of Exercise per Session: 50 min  Stress: No Stress Concern Present (03/24/2023)   [employer]  of Occupational Health - Occupational Stress Questionnaire    Feeling of Stress : Only a little  Social Connections: Moderately Isolated (03/24/2023)   Social Connection and Isolation Panel [NHANES]    Frequency of Communication with Friends and Family: More than three times a week    Frequency of Social Gatherings with Friends and Family: More than three times a week    Attends Religious Services: Never    Database administrator or Organizations: No    Attends Engineer, structural: Never    Marital Status: Married    Current Medications:  Current Outpatient Medications:    albuterol (PROVENTIL) (2.5 MG/3ML) 0.083% nebulizer solution, Take 3 mLs (2.5 mg total) by nebulization every 6 (six) hours as needed for wheezing or shortness of breath., Disp: 75 mL, Rfl: 12   albuterol (VENTOLIN HFA) 108 (90 Base) MCG/ACT inhaler, TAKE 2 PUFFS BY MOUTH EVERY 6 HOURS AS NEEDED FOR WHEEZE OR SHORTNESS OF BREATH, Disp: 8.5 each, Rfl: 6   amoxicillin (AMOXIL) 500 MG capsule, , Disp: , Rfl:    ASPIRIN 81 PO, Take 1 tablet by mouth daily., Disp: , Rfl:    Calcium Carb-Cholecalciferol 600-800 MG-UNIT TABS, Take 1 tablet by mouth daily., Disp: , Rfl:    diltiazem (CARDIZEM CD) 120 MG 24 hr capsule, TAKE 1 CAPSULE BY MOUTH EVERY DAY, Disp: 90 capsule, Rfl: 3   guaiFENesin (MUCINEX) 600 MG 12 hr tablet, Take by mouth 2 (two) times daily., Disp: , Rfl:    losartan (COZAAR) 25 MG tablet, TAKE 0.5 TABLETS BY MOUTH DAILY. PLEASE KEEP YOUR UPCOMING APPOINTMENT FOR FUTURE REFILLS., Disp: 45 tablet, Rfl: 2   LUMIGAN 0.01 % SOLN, , Disp: , Rfl:    Multiple Vitamins-Minerals (MULTIVITAMIN & MINERAL PO), Take 1 tablet by  mouth daily., Disp: , Rfl:    nitroGLYCERIN (NITROSTAT) 0.4 MG SL tablet, Place 1 tablet (0.4 mg total) under the tongue every 5 (five) minutes as needed for chest pain. Please call [phone] to make appointment with Dr. Tenny Craw for future refills., Disp: 25 tablet, Rfl: 0   Respiratory Therapy  Supplies (FLUTTER) DEVI, 1 application by Does not apply route 3 (three) times daily., Disp: 1 each, Rfl: 0   rosuvastatin (CRESTOR) 5 MG tablet, Take 1 tablet (5 mg total) by mouth daily., Disp: 90 tablet, Rfl: 3   Saccharomyces boulardii (FLORASTOR PO), Take 1 capsule by mouth daily., Disp: , Rfl:    sodium chloride 0.9 % nebulizer solution, Take 3 mLs by nebulization 2 (two) times daily as needed (cough/congestion)., Disp: 90 mL, Rfl: 12   Tiotropium Bromide-Olodaterol (STIOLTO RESPIMAT) 2.5-2.5 MCG/ACT AERS, Inhale 2 puffs into the lungs daily., Disp: 12 g, Rfl: 1   TURMERIC CURCUMIN PO, Take by mouth., Disp: , Rfl:    VITAMIN D PO, Take 4,000 Int'l Units by mouth., Disp: , Rfl:    Zinc 30 MG CAPS, , Disp: , Rfl:   Review of Symptoms: Complete 10-system review is negative except as above in Interval History.  Physical Exam: BP 134/66 (BP Location: Left Arm, Patient Position: Sitting)   Pulse 88   Temp 97.8 F (36.6 C) (Oral)   Resp 20   Wt 127 lb (57.6 kg)   LMP 02/23/1985   SpO2 99%   BMI 23.23 kg/m  General: Alert, oriented, no acute distress. HEENT: Normocephalic, atraumatic. Neck symmetric without masses. Sclera anicteric.  Chest: Normal work of breathing.  Extremities: Grossly normal range of motion.  Warm, well perfused.     Laboratory & Radiologic Studies: Lab Results  Component Value Date   CAN125 54.0 (H) 05/04/2023    MR Pelvis W Wo Contrast 05/04/2023  Narrative CLINICAL DATA:  Left adnexal mass.  Elevated tumor markers.  EXAM: MRI PELVIS WITHOUT AND WITH CONTRAST  TECHNIQUE: Multiplanar multisequence MR imaging of the pelvis was performed both before and after administration of intravenous contrast.  CONTRAST:  5mL GADAVIST GADOBUTROL 1 MMOL/ML IV SOLN  COMPARISON:  CT on 10/28/2022  FINDINGS: Lower Urinary Tract: No urinary bladder or urethral abnormality identified.  Bowel: Sigmoid diverticulosis, without signs of  diverticulitis.  Vascular/Lymphatic: Unremarkable. No pathologically enlarged pelvic lymph nodes identified.  Reproductive:  -- Uterus: Measures 6.5 x 2.5 x 5.3 cm. No myometrial masses or focal endometrial lesions identified. Cervix and vagina are unremarkable.  -- Right ovary: Appears normal. No ovarian or adnexal masses identified.  -- Left ovary: A simple cyst is seen in the left adnexa which measures 4.7 x 3.7 x 3.8 cm. This has no complex features.  Other: No peritoneal thickening or abnormal free fluid.  Musculoskeletal:  Unremarkable.  IMPRESSION: 4.7 cm simple, benign appearing left adnexal cyst. (O-RADS MRI Score 2 - Almost Certainly Benign <1% ROM) Consider continued followup by [place] in 6 months.  Normal appearance of uterus and right ovary.  Sigmoid diverticulosis, without radiographic evidence of diverticulitis.   Electronically Signed By: Danae Orleans M.D. On: 05/11/2023 13:22

## 2023-05-17 NOTE — Patient Instructions (Signed)
 It was a pleasure to see you in clinic today. - Your MRI is very reassuring. - The mild elevation in the CA125 is indeterminate at this time. Could be in part due to being done at a different lab as well as could be from inflammation. - Recommend close surveillance with repeat CT abdomen/pelvis in 82mo - Return visit planned for 82mo after CT scan.  Thank you very much for allowing me to provide care for you today.  I appreciate your confidence in choosing our Gynecologic Oncology team at [facility].  If you have any questions about your visit today please call our office or send [place] a MyChart message and we will get back to you as soon as possible.

## 2023-05-20 DIAGNOSIS — J471 Bronchiectasis with (acute) exacerbation: Secondary | ICD-10-CM | POA: Diagnosis not present

## 2023-05-24 ENCOUNTER — Encounter: Payer: Self-pay | Admitting: Psychiatry

## 2023-06-02 ENCOUNTER — Other Ambulatory Visit: Payer: Self-pay | Admitting: Internal Medicine

## 2023-06-12 ENCOUNTER — Encounter: Payer: Self-pay | Admitting: Internal Medicine

## 2023-06-20 DIAGNOSIS — J471 Bronchiectasis with (acute) exacerbation: Secondary | ICD-10-CM | POA: Diagnosis not present

## 2023-06-21 ENCOUNTER — Telehealth: Payer: Self-pay | Admitting: Nurse Practitioner

## 2023-06-21 NOTE — Telephone Encounter (Signed)
 Copied from CRM [id number]. Topic: General - Other >> Jun 21, 2023 11:24 AM Roseanne Cones wrote: Reason for CRM: Patient received a nebulizer for her home care - She has misplaced the top half of the dome - Patient would like to know how she can go about purchasing that part or how she would go about replacing it - preferably some place in [place]. Patient of Girard Lam.

## 2023-06-22 NOTE — Telephone Encounter (Signed)
 ATC x1 LVM for patient to call patient back regarding prior message .

## 2023-06-24 NOTE — Telephone Encounter (Signed)
 Spoke with patient spouse she has brought a replacement piece on [place] and is all set now

## 2023-07-20 DIAGNOSIS — J471 Bronchiectasis with (acute) exacerbation: Secondary | ICD-10-CM | POA: Diagnosis not present

## 2023-07-28 ENCOUNTER — Ambulatory Visit (INDEPENDENT_AMBULATORY_CARE_PROVIDER_SITE_OTHER): Admitting: Nurse Practitioner

## 2023-07-28 ENCOUNTER — Encounter: Payer: Self-pay | Admitting: Nurse Practitioner

## 2023-07-28 VITALS — BP 130/74 | HR 78 | Ht 62.0 in | Wt 128.6 lb

## 2023-07-28 DIAGNOSIS — Z87891 Personal history of nicotine dependence: Secondary | ICD-10-CM | POA: Diagnosis not present

## 2023-07-28 DIAGNOSIS — Z2239 Carrier of other specified bacterial diseases: Secondary | ICD-10-CM | POA: Diagnosis not present

## 2023-07-28 DIAGNOSIS — J479 Bronchiectasis, uncomplicated: Secondary | ICD-10-CM

## 2023-07-28 DIAGNOSIS — J449 Chronic obstructive pulmonary disease, unspecified: Secondary | ICD-10-CM

## 2023-07-28 DIAGNOSIS — R918 Other nonspecific abnormal finding of lung field: Secondary | ICD-10-CM

## 2023-07-28 NOTE — Patient Instructions (Addendum)
 Continue Albuterol  inhaler 2 puffs or 3 mL neb every 6 hours as needed for shortness of breath or wheezing. Notify if symptoms persist despite rescue inhaler/neb use.  Continue Stiolto 2 puffs daily Continue guaifenesin  600 mg 1-2 daily for chest congestion Continue flutter valve 2-3 times a day for chest congestion    When you are having chest congestion, productive cough, chest tightness, wheezing, increased shortness of breath... -Use the albuterol  breathing treatment twice a day followed by a saline nebulizer 3 mL treatment twice a day then your flutter valve 10 times after the neb  -You will use your Stiolto in the morning after your treatment  -You should also increase mucinex  to twice a day if you are having thick phlegm or more chest congestion that feels more difficult to clear   Repeat CT chest in August  You had some new, small nodules which are likely mucus plugging or inflammatory but we will repeat to keep an eye on them    Follow up in 8/22 at 1030 am with Dr. Diania Fortes to review CT chest. If symptoms do not improve or worsen, please contact office for sooner follow up or seek emergency care.

## 2023-07-28 NOTE — Assessment & Plan Note (Signed)
 Extensive disease. See above. Continue mucociliary clearance regimen.

## 2023-07-28 NOTE — Assessment & Plan Note (Addendum)
 Few scattered, small nodules (3-5 mm) in lower lobes. CT chest in August 2025 to ensure stability/resolution.

## 2023-07-28 NOTE — Assessment & Plan Note (Signed)
 Secondary to btx and smoking hx. See above

## 2023-07-28 NOTE — Assessment & Plan Note (Signed)
 Positive pseudomonas culture, intermediate to cipro  and levofloxacin . Last abx course of levaquin  in November 2024. Her infectious symptoms resolved following this. She has chronic productive cough at baseline. She's had positive cultures in the past. No superimposed infection on imaging. Suspect she is colonized. She will notify if she develops new/worsening symptoms. Advised to continue mucociliary clearance therapies.   Patient Instructions  Continue Albuterol  inhaler 2 puffs or 3 mL neb every 6 hours as needed for shortness of breath or wheezing. Notify if symptoms persist despite rescue inhaler/neb use.  Continue Stiolto 2 puffs daily Continue guaifenesin  600 mg 1-2 daily for chest congestion Continue flutter valve 2-3 times a day for chest congestion    When you are having chest congestion, productive cough, chest tightness, wheezing, increased shortness of breath... -Use the albuterol  breathing treatment twice a day followed by a saline nebulizer 3 mL treatment twice a day then your flutter valve 10 times after the neb  -You will use your Stiolto in the morning after your treatment  -You should also increase mucinex  to twice a day if you are having thick phlegm or more chest congestion that feels more difficult to clear   Repeat CT chest in August  You had some new, small nodules which are likely mucus plugging or inflammatory but we will repeat to keep an eye on them    Follow up in 8/22 at 1030 am with Dr. Diania Fortes to review CT chest. If symptoms do not improve or worsen, please contact office for sooner follow up or seek emergency care.

## 2023-07-28 NOTE — Progress Notes (Signed)
 @Patient  ID: Miranda Thompson, female    DOB: 1933-07-12, 89 y.o.   MRN: [id number]  Chief Complaint  Patient presents with   Follow-up    Patient states she has heavy leg and fatigue. She states the nebulizer is helping.    Referring provider: Roslyn Coombe, MD  HPI: 89 year old female, former smoker followed for COPD, bronchiectasis, and lung nodules. She is a former patient of Dr. Glenis Langdon and last seen in office on 04/26/2023 by [facility] NP. Past medical history significant for atherosclerosis, GERD, OA, HLD.   TEST/EVENTS:  10/10/2021 echocardiogram: EF 66 5%.  Normal diastolic parameters.  RV size and function normal.  Normal PASP.  Trivial MR. 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  Small hiatal hernia.  Patulous esophagus.  No LAD.  Bilateral bronchiectasis, more pronounced in the lingula.  Scattered solid pulmonary nodules some of which demonstrate central cystic change.  Pulmonary findings are not significantly changed when compared to previous exam. 01/07/2022 ONO: 4 min <88%, SpO2 low 85% with average 93.4% 04/20/2023 CT chest: atherosclerosis. Extensive chronic interstitial lung disease with extensive LUL fibro atelectatic and btx changes without evidence of acute infiltrates or consolidations of LUL. Similar but less prominent bronchiectatic changes in RML and LLL. Several small subpleural pulmonary nodules between 3 and 5 mm; inflammatory or postinflammatory nodular changes in RML and RUL. Postsurgical changes in left breast with several metallic densities.   01/01/2021: OV with Dr. Thelda Finney. Here today for follow up after CT scan of the chest. CT completed in October 2022 with varicoid btx and a few cysts within the lungs. Other scattered small nodules. No significant change. Plan to repeat in 2-3 years. Breathing doesn't limit activity. Does use flutter valve occasionally. Not much sputum production. Doing well  with stiolto.   01/02/2022: OV with Taevin Mcferran NP for follow-up to discuss CT scan results.  Her bronchiectasis and lung nodules were stable when compared to previous imaging.  No increased size or progression of disease noted.  She does have an enlarged right pulmonary artery; however, echocardiogram from August 2023 was without any right heart strain and with normal pulmonary artery pressures.  Today, she tells me that she has had some trouble with her breathing over the last 6 to 8 months.  Feels like she had a slow decline.  Gets winded with simple tasks around the house such as changing the bed sheets.  She does also have some associated palpitations, feeling like her heart is fluttering, and dizziness.  She is being evaluated by cardiology for concern for A-fib.  She just completed wearing a heart monitor this week.  Has not received the results from this yet.  No increased cough or chest congestion.  She denies any wheezing, fevers, night sweats, mops this, weight gain or loss, lower extremity swelling, orthopnea, PND.  No episodes of syncope or chest pain.  She is currently on Stiolto.  Uses guaifenesin  and flutter valve as needed for chest congestion.    04/02/2022: OV with Courtlyn Aki NP for follow up with her husband. Her daughter is also on the phone during our visit. She tells me that she is feeling unchanged compared to when she was here last. She still has fatigue and gets more short winded with activities around the house, especially changing the bed. She does have some associated palpitations; workup with cardiology has been unremarkable. Occasionally has some associated palpitations. At this point, symptoms have  been ongoing for the last year. She doesn't feel any worse today. No increased cough or chest congestion. Denies fevers, chills, hemoptysis, night sweats. Eating and drinking well. She doesn't use her rescue inhaler often. She is compliant with Stiolto and uses guaifenesin  and flutter as needed.  She  did start using a dental paste with fluoride in it and thinks she left it on for too long the other night. She also didn't rinse it off like she normally does. She has a few sores in her mouth now, which are all very small and white/red. Denies any associated symptoms.   10/12/2022: OV with Dr. Thelda Finney. Overall doing well. Uses stiolto. Had a CT scan in October with stable btx. No exacerbations. With ongoing back issues, likely not a great candidate for vest therapy.   03/12/2023: OV with Erabella Kuipers NP. Discussed the use of AI scribe software for clinical note transcription with the patient, who gave verbal consent to proceed.  The patient, accompanied by her husband, presents for acute visit. She reported feeling unwell since November, with symptoms including a tight chest, a persistent cough, and significant fatigue. The patient described the cough as severe, lasting up to 20 minutes at a time, and initially unproductive. She then started developing increased phlegm production that was green. She also reported a low-grade fever. She went to her PCP 11/21 who prescribed levaquin  and prednisone  with improvement in symptoms. She had tested negative for COVID-19. Her husband was also sick at the time.  About a week or two ago, symptoms returned. She was coughing up sputum streaked with blood, but this has since resolved. The patient also reported shortness of breath and audible wheezing, which has improved as well but not completely resolved. The cough is less frequent and productive but still sometimes gets up green to yellow phlegm. She does feel like her breathing is at her baseline for the most part. No fevers, chills. Eating and drinking well. The patient has a history of bronchiectasis and has been managing her condition with albuterol  and Stiolto inhalers, as well as Mucinex  once a day. She also reported using a flutter valve, but not on a daily basis. She does not have a nebulizer.  Despite her current symptoms,  the patient believes she is on the upward trend of recovery.   04/26/2023: OV with Maye Parkinson NP for follow up with her daughter and husband. Cough and breathing have improved. She feels like the nebs have made a huge difference and seem to be helping. Sputum production is yellow in color but comes up easily. This is baseline for her. She's not had any more hemoptysis. She denies any wheezing, fevers, chills. Eating and drinking well. Using Stiolto daily. Doing her nebs 1-2 times a day. Taking guaifenesin  and flutter valve.  She did have a recent CT chest that showed a few small, new nodules, which are likely infectious/inflammatory in nature, as well as extensive chronic bronchiectasis.   07/28/2023: Today - follow up Discussed the use of AI scribe software for clinical note transcription with the patient, who gave verbal consent to proceed.  History of Present Illness   Miranda Thompson is an 89 year old female who presents for a pulmonary follow-up with her daughter.   She is doing well for the most part since her last visit. Nebulizer treatments have helped tremendously. Using these 1-2 times a day. Some days doesn't need them. She is sill doing her stiolto. She uses her flutter valve once a day, most days. Occasionally  gets up some white to yellow phlegm, which is baseline for her. No fevers, chills, hemoptysis, weight loss, night sweats.   She experiences persistent fatigue and heavy legs with exertion, which is baseline for her. No issues with her oxygen levels. She did have an episode of pain on the right side under her ribs while at the beach a few weeks ago, which was alleviated by taking two aspirin  and practicing deep breathing.  Has not recurred since.       No Known Allergies  Immunization History  Administered Date(s) Administered   Fluad Quad(high Dose 65+) 11/08/2019, 11/14/2020, 11/19/2021   Influenza Split 12/03/2011   Influenza Whole 11/30/2007, 11/30/2008, 12/03/2009, 11/24/2010    Influenza, High Dose Seasonal PF 11/19/2015, 12/02/2016, 12/17/2017, 11/22/2018   Influenza,inj,Quad PF,6+ Mos 11/10/2012, 11/08/2013, 11/06/2014   Influenza-Unspecified 12/02/2016, 11/22/2018, 11/14/2021, 12/15/2022   PFIZER Comirnaty(Gray Top)Covid-19 Tri-Sucrose Vaccine 05/27/2020   PFIZER(Purple Top)SARS-COV-2 Vaccination 03/14/2019, 04/04/2019, 11/18/2019   Pfizer(Comirnaty)Fall Seasonal Vaccine 12 years and older 11/14/2021   Pneumococcal Conjugate-13 05/09/2014   Pneumococcal Polysaccharide-23 11/07/2007   Tdap 05/09/2014   Unspecified SARS-COV-2 Vaccination 12/15/2022   Zoster Recombinant(Shingrix) 04/10/2018, 01/08/2019   Zoster, Live 12/11/2009    Past Medical History:  Diagnosis Date   Adenomatous colon polyp    Allergic rhinitis    Anemia    pt states no anemia in her past hx thst she is aware of    Atrial fibrillation (HCC)    goes in and out of this rhythm- takes Tenormin    Breast cancer, left breast (HCC) 1994 with lumpectomy   chemo,tamox,radiation   Bronchiectasis with acute exacerbation (HCC)    COPD (chronic obstructive pulmonary disease) (HCC)    Coronary artery disease    Diverticulosis of colon    DJD (degenerative joint disease)    Dysrhythmia    palpitations   Family history of brain cancer    Family history of colon cancer 02/14/2018   Family history of colon cancer    Family history of malignant neoplasm of gastrointestinal tract    Family history of melanoma    Family history of uterine cancer    Fibromyalgia    GERD (gastroesophageal reflux disease) 02/14/2018   Glaucoma    Hemoptysis    History of pneumonia    Hypercholesteremia    pt denies   Hypertension    Low back pain syndrome    Neuromuscular disorder (HCC)    hx fibromyalgia    Osteopenia    Pneumonia    hx   Pseudomonas infection    Rotator cuff syndrome of right shoulder    Spinal stenosis     Tobacco History: Social History   Tobacco Use  Smoking Status Former    Current packs/day: 0.00   Types: Cigarettes   Quit date: 02/24/1967   Years since quitting: 56.4  Smokeless Tobacco Never   Counseling given: Not Answered   Outpatient Medications Prior to Visit  Medication Sig Dispense Refill   albuterol  (PROVENTIL ) (2.5 MG/3ML) 0.083% nebulizer solution Take 3 mLs (2.5 mg total) by nebulization every 6 (six) hours as needed for wheezing or shortness of breath. 75 mL 12   albuterol  (VENTOLIN  HFA) 108 (90 Base) MCG/ACT inhaler TAKE 2 PUFFS BY MOUTH EVERY 6 HOURS AS NEEDED FOR WHEEZE OR SHORTNESS OF BREATH 8.5 each 6   amoxicillin  (AMOXIL ) 500 MG capsule      ASPIRIN  81 PO Take 1 tablet by mouth daily.     Calcium  Carb-Cholecalciferol  600-800 MG-UNIT TABS Take  1 tablet by mouth daily.     diltiazem  (CARDIZEM  CD) 120 MG 24 hr capsule TAKE 1 CAPSULE BY MOUTH EVERY DAY 90 capsule 3   guaiFENesin  (MUCINEX ) 600 MG 12 hr tablet Take by mouth 2 (two) times daily.     losartan  (COZAAR ) 25 MG tablet TAKE 0.5 TABLETS BY MOUTH DAILY. PLEASE KEEP YOUR UPCOMING APPOINTMENT FOR FUTURE REFILLS. 45 tablet 2   LUMIGAN 0.01 % SOLN      Multiple Vitamins-Minerals (MULTIVITAMIN & MINERAL PO) Take 1 tablet by mouth daily.     nitroGLYCERIN  (NITROSTAT ) 0.4 MG SL tablet PLACE 1 TABLET (0.4 MG TOTAL) UNDER THE TONGUE EVERY 5 (FIVE) MINUTES AS NEEDED FOR CHEST PAIN. PLEASE CALL [phone] TO MAKE APPOINTMENT WITH DR. ROSS FOR FUTURE REFILLS. 25 tablet 5   Respiratory Therapy Supplies (FLUTTER) DEVI 1 application by Does not apply route 3 (three) times daily. 1 each 0   rosuvastatin  (CRESTOR ) 5 MG tablet Take 1 tablet (5 mg total) by mouth daily. 90 tablet 3   Saccharomyces boulardii (FLORASTOR PO) Take 1 capsule by mouth daily.     sodium chloride  0.9 % nebulizer solution Take 3 mLs by nebulization 2 (two) times daily as needed (cough/congestion). 90 mL 12   Tiotropium Bromide-Olodaterol (STIOLTO RESPIMAT ) 2.5-2.5 MCG/ACT AERS Inhale 2 puffs into the lungs daily. 12 g 1   TURMERIC  CURCUMIN PO Take by mouth.     VITAMIN D  PO Take 4,000 Int'l Units by mouth.     Zinc 30 MG CAPS      No facility-administered medications prior to visit.     Review of Systems:   Constitutional: No weight loss or gain, night sweats, fevers, chills +fatigue, lassitude (improved; baseline) HEENT: No headaches, difficulty swallowing, tooth/dental problems, or sore throat. No sneezing, itching, ear ache, nasal congestion, or post nasal drip CV: No chest pain, orthopnea, PND, swelling in lower extremities, anasarca, syncope, dizziness, palpitations.  Resp: +shortness of breath with exertion (baseline); minimal productive cough (baseline). No wheeze. No excess mucus or change in color of mucus. No hemoptysis. No chest wall deformity GI:  No heartburn, indigestion, abdominal pain, nausea, vomiting, diarrhea, change in bowel habits, loss of appetite, bloody stools.  Skin: No rash, lesions, ulcerations MSK:  No joint pain or swelling.  No decreased range of motion.  +chronic back pain. Neuro: No dizziness or lightheadedness.  Psych: No depression or anxiety. Mood stable.     Physical Exam:  BP 130/74 (BP Location: Right Arm, Patient Position: Sitting, Cuff Size: Normal)   Pulse 78   Ht 5\' 2"  (1.575 m)   Wt 128 lb 9.6 oz (58.3 kg)   LMP 02/23/1985   SpO2 99%   BMI 23.52 kg/m   GEN: Pleasant, interactive, well-appearing; elderly; in no acute distress. HEENT:  Normocephalic and atraumatic. PERRLA. Sclera white. Nasal turbinates pink, moist and patent bilaterally. No rhinorrhea present. Oropharynx pink and moist, without exudate or edema. No lesions, ulcerations, or postnasal drip.  NECK:  Supple w/ fair ROM. No JVD present. Normal carotid impulses w/o bruits. Thyroid  symmetrical with no goiter or nodules palpated. No lymphadenopathy.   CV: RRR, no m/r/g, no peripheral edema. Pulses intact, +2 bilaterally. No cyanosis, pallor or clubbing. PULMONARY:  Unlabored, regular breathing. Diminished  bilaterally A&P w/o wheezes/rales/rhonchi. No accessory muscle use.  GI: BS present and normoactive. Soft, non-tender to palpation. No organomegaly or masses detected.  MSK: No erythema, warmth or tenderness. Cap refil <2 sec all extrem. No deformities or joint swelling  noted.  Neuro: A/Ox3. No focal deficits noted.   Skin: Warm, no lesions or rashe Psych: Normal affect and behavior. Judgement and thought content appropriate.     Lab Results:  CBC    Component Value Date/Time   WBC 6.7 10/22/2022 1402   RBC 3.79 (L) 10/22/2022 1402   HGB 12.5 10/22/2022 1402   HGB 12.3 04/10/2021 1538   HCT 37.2 10/22/2022 1402   HCT 35.8 04/10/2021 1538   PLT 272.0 10/22/2022 1402   PLT 252 04/10/2021 1538   MCV 98.1 10/22/2022 1402   MCV 96 04/10/2021 1538   MCH 32.9 04/10/2021 1538   MCH 32.3 10/11/2016 1131   MCHC 33.5 10/22/2022 1402   RDW 12.6 10/22/2022 1402   RDW 11.4 (L) 04/10/2021 1538   LYMPHSABS 1.2 10/22/2022 1402   MONOABS 0.6 10/22/2022 1402   EOSABS 0.2 10/22/2022 1402   BASOSABS 0.1 10/22/2022 1402    BMET    Component Value Date/Time   NA 129 (L) 10/22/2022 1402   NA 132 (L) 10/01/2021 1127   K 4.0 10/22/2022 1402   CL 94 (L) 10/22/2022 1402   CO2 29 10/22/2022 1402   GLUCOSE 88 10/22/2022 1402   BUN 14 10/22/2022 1402   BUN 13 10/01/2021 1127   CREATININE 0.63 10/22/2022 1402   CALCIUM  9.4 10/22/2022 1402   GFRNONAA >60 10/11/2016 1131   GFRAA >60 10/11/2016 1131    BNP No results found for: "BNP"   Imaging:  No results found.   Administration History     None           No data to display          No results found for: "NITRICOXIDE"      Assessment & Plan:     Pseudomonas aeruginosa colonization Positive pseudomonas culture, intermediate to cipro  and levofloxacin . Last abx course of levaquin  in November 2024. Her infectious symptoms resolved following this. She has chronic productive cough at baseline. She's had positive cultures  in the past. No superimposed infection on imaging. Suspect she is colonized. She will notify if she develops new/worsening symptoms. Advised to continue mucociliary clearance therapies.   Patient Instructions  Continue Albuterol  inhaler 2 puffs or 3 mL neb every 6 hours as needed for shortness of breath or wheezing. Notify if symptoms persist despite rescue inhaler/neb use.  Continue Stiolto 2 puffs daily Continue guaifenesin  600 mg 1-2 daily for chest congestion Continue flutter valve 2-3 times a day for chest congestion    When you are having chest congestion, productive cough, chest tightness, wheezing, increased shortness of breath... -Use the albuterol  breathing treatment twice a day followed by a saline nebulizer 3 mL treatment twice a day then your flutter valve 10 times after the neb  -You will use your Stiolto in the morning after your treatment  -You should also increase mucinex  to twice a day if you are having thick phlegm or more chest congestion that feels more difficult to clear   Repeat CT chest in August  You had some new, small nodules which are likely mucus plugging or inflammatory but we will repeat to keep an eye on them    Follow up in 8/22 at 1030 am with Dr. Diania Fortes to review CT chest. If symptoms do not improve or worsen, please contact office for sooner follow up or seek emergency care.   Bronchiectasis (HCC) Extensive disease. See above. Continue mucociliary clearance regimen   COPD, moderate (HCC) Secondary to btx and smoking hx.  See above  Lung nodules Few scattered, small nodules (3-5 mm) in lower lobes. CT chest in August 2025 to ensure stability/resolution.     I spent 25 minutes of dedicated to the care of this patient on the date of this encounter to include pre-visit review of records, face-to-face time with the patient discussing conditions above, post visit ordering of testing, clinical documentation with the electronic health record, making  appropriate referrals as documented, and communicating necessary findings to members of the patients care team.  Roetta Clarke, NP 07/28/2023  Pt aware and understands NP's role.

## 2023-08-09 ENCOUNTER — Encounter: Payer: Self-pay | Admitting: Internal Medicine

## 2023-08-11 DIAGNOSIS — H43813 Vitreous degeneration, bilateral: Secondary | ICD-10-CM | POA: Diagnosis not present

## 2023-08-11 DIAGNOSIS — H2512 Age-related nuclear cataract, left eye: Secondary | ICD-10-CM | POA: Diagnosis not present

## 2023-08-11 DIAGNOSIS — H35372 Puckering of macula, left eye: Secondary | ICD-10-CM | POA: Diagnosis not present

## 2023-08-11 DIAGNOSIS — H25012 Cortical age-related cataract, left eye: Secondary | ICD-10-CM | POA: Diagnosis not present

## 2023-08-11 DIAGNOSIS — H401132 Primary open-angle glaucoma, bilateral, moderate stage: Secondary | ICD-10-CM | POA: Diagnosis not present

## 2023-08-20 DIAGNOSIS — J471 Bronchiectasis with (acute) exacerbation: Secondary | ICD-10-CM | POA: Diagnosis not present

## 2023-08-24 ENCOUNTER — Encounter: Payer: Self-pay | Admitting: Internal Medicine

## 2023-08-24 DIAGNOSIS — L84 Corns and callosities: Secondary | ICD-10-CM

## 2023-08-24 DIAGNOSIS — M21619 Bunion of unspecified foot: Secondary | ICD-10-CM

## 2023-08-29 NOTE — Progress Notes (Unsigned)
 Cardiology Office Note    Date:  08/30/2023  ID:  Miranda, Thompson 07/26/1933, MRN [id number] PCP:  Norleen Lynwood ORN, MD  Cardiologist:  Vina Gull, MD  Electrophysiologist:  None   Chief Complaint: Follow up for CAD   History of Present Illness: Miranda Thompson    THERESEA Thompson is a 89 y.o. female with visit-pertinent history of COPD, mild nonobstructive CAD on coronary CTA in 2023, PACs, HTN, HLD.   Patient was initially evaluated by Dr. Nishan for palpitations in 2015, at that time there felt to mostly be atrial arrhythmias due to lung disease and bronchiectasis.  Patient was started on atenolol  however complained of fatigue and was transitioned to diltiazem .  Patient wore cardiac monitor which showed PACs/PVCs.  Echo in 09/2018 with LVEF of 60 to 65% with no valvular disease and no pulmonary hypertension.  Chest CT on 09/14/2018 with some progression of ILD/bronchiectasis.  Echo on 10/10/2021 indicated LVEF of 60 to 65%, no RWMA, diastolic parameters were normal, RV systolic function and size was normal, normal PASP.  There was mild calcification of the aortic valve, aortic sclerosis was present with no evidence of stenosis.  Coronary CTA on 10/17/2021 indicated coronary calcium  score of 323, nonobstructive CAD with mixed plaque in proximal LAD with 25 to 49% stenosis, mixed plaque in proximal LCx with 25 to 49% stenosis, next plaque in proximal RCA was 0 to 24% stenosis.  Cardiac monitor in 01/09/2022 indicated normal sinus rhythm with an average heart rate of 89 bpm, frequent PACs, rare PVCs, no atrial fibrillation.  Patient was last seen in clinic on 12/15/2022 by Dr. Gull.  Patient remained stable from a cardiac standpoint.  Today she presents for follow up. She reports that she is doing well overall.  She denies any acute changes or cardiac concerns today.  She continues to note an occasional bandlike sensation around her chest along the lateral bra.  She notes this can occur while wearing her bra or  without, typically is worse while wearing.  Patient reports that this is unchanged and is overall concerning.  She reports very infrequent palpitations, when they do occur they are are short episodes that are overall not bothersome.  She reports that her breathing is stable.  She denies lower extremity edema, orthopnea or PND.  ROS: .   Today she denies lower extremity edema, melena, hematuria, hemoptysis, diaphoresis, weakness, presyncope, syncope, orthopnea, and PND.  All other systems are reviewed and otherwise negative. Studies Reviewed: Miranda Thompson   EKG:  EKG is ordered today, personally reviewed, demonstrating  EKG Interpretation Date/Time:  Monday August 30 2023 15:22:34 EDT Ventricular Rate:  83 PR Interval:  168 QRS Duration:  78 QT Interval:  374 QTC Calculation: 439 R Axis:   -22  Text Interpretation: Normal sinus rhythm Anteroseptal infarct (cited on or before 15-Dec-2022) Nonspecific T wave abnormality Confirmed by Warwick Nick [phone]) on 08/30/2023 4:46:14 PM   CV Studies: Cardiac studies reviewed are outlined and summarized above. Otherwise please see EMR for full report. Cardiac Studies & Procedures   ______________________________________________________________________________________________     ECHOCARDIOGRAM  ECHOCARDIOGRAM COMPLETE 10/10/2021  Narrative ECHOCARDIOGRAM REPORT    Patient Name:   Miranda Thompson Date of Exam: 10/10/2021 Medical Rec #:  [id number]        Height:       63.0 in Accession #:    [id number]       Weight:       131.6 lb Date of Birth:  31-Oct-1933        BSA:          1.619 m Patient Age:    88 years         BP:           138/70 mmHg Patient Gender: F                HR:           85 bpm. Exam Location:  [address]  Procedure: 2D Echo, Cardiac Doppler, Color Doppler and Strain Analysis  Indications:    Dyspnea on exertion [R06.09 (ICD-10-CM)]; Chest pain of uncertain etiology [R07.9 (ICD-10-CM)]; Precordial pain  [R07.2 (ICD-10-CM)]  History:        Patient has prior history of Echocardiogram examinations, most recent 09/27/2018. Arrythmias:PAC.  Sonographer:    Charlie Jointer RDCS Referring Phys: [phone] PHILIP J NAHSER  IMPRESSIONS   1. Left ventricular ejection fraction, by estimation, is 60 to 65%. The left ventricle has normal function. The left ventricle has no regional wall motion abnormalities. Left ventricular diastolic parameters were normal. 2. Right ventricular systolic function is normal. The right ventricular size is normal. There is normal pulmonary artery systolic pressure. The estimated right ventricular systolic pressure is 28.4 mmHg. 3. Right atrial size was mildly dilated. 4. The mitral valve is normal in structure. Trivial mitral valve regurgitation. No evidence of mitral stenosis. 5. The aortic valve is tricuspid. There is mild calcification of the aortic valve. Aortic valve regurgitation is not visualized. Aortic valve sclerosis is present, with no evidence of aortic valve stenosis. 6. The inferior vena cava is normal in size with greater than 50% respiratory variability, suggesting right atrial pressure of 3 mmHg.  Comparison(s): No significant change from prior study.  Conclusion(s)/Recommendation(s): Otherwise normal echocardiogram, with minor abnormalities described in the report.  FINDINGS Left Ventricle: Left ventricular ejection fraction, by estimation, is 60 to 65%. The left ventricle has normal function. The left ventricle has no regional wall motion abnormalities. Global longitudinal strain performed but not reported based on interpreter judgement due to suboptimal tracking. The left ventricular internal cavity size was normal in size. There is no left ventricular hypertrophy. Left ventricular diastolic parameters were normal.  Right Ventricle: The right ventricular size is normal. No increase in right ventricular wall thickness. Right ventricular systolic function is  normal. There is normal pulmonary artery systolic pressure. The tricuspid regurgitant velocity is 2.52 m/s, and with an assumed right atrial pressure of 3 mmHg, the estimated right ventricular systolic pressure is 28.4 mmHg.  Left Atrium: Left atrial size was normal in size.  Right Atrium: Right atrial size was mildly dilated.  Pericardium: There is no evidence of pericardial effusion.  Mitral Valve: The mitral valve is normal in structure. Trivial mitral valve regurgitation. No evidence of mitral valve stenosis.  Tricuspid Valve: The tricuspid valve is normal in structure. Tricuspid valve regurgitation is mild . No evidence of tricuspid stenosis.  Aortic Valve: The aortic valve is tricuspid. There is mild calcification of the aortic valve. Aortic valve regurgitation is not visualized. Aortic valve sclerosis is present, with no evidence of aortic valve stenosis. Aortic valve mean gradient measures 2.0 mmHg. Aortic valve peak gradient measures 3.9 mmHg.  Pulmonic Valve: The pulmonic valve was grossly normal. Pulmonic valve regurgitation is trivial. No evidence of pulmonic stenosis.  Aorta: The aortic root, ascending aorta, aortic arch and descending aorta are all structurally normal, with no evidence of dilitation or obstruction.  Venous: The inferior vena  cava is normal in size with greater than 50% respiratory variability, suggesting right atrial pressure of 3 mmHg.  IAS/Shunts: The atrial septum is grossly normal.   LEFT VENTRICLE PLAX 2D LVIDd:         3.60 cm   Diastology LVIDs:         2.20 cm   LV e' medial:    6.53 cm/s LV PW:         1.00 cm   LV E/e' medial:  11.0 LV IVS:        0.80 cm   LV e' lateral:   10.20 cm/s LVOT diam:     2.00 cm   LV E/e' lateral: 7.0 LVOT Area:     3.14 cm   RIGHT VENTRICLE             IVC RV Basal diam:  2.60 cm     IVC diam: 1.60 cm RV S prime:     12.40 cm/s TAPSE (M-mode): 2.8 cm  LEFT ATRIUM             Index        RIGHT ATRIUM            Index LA diam:        3.30 cm 2.04 cm/m   RA Area:     18.10 cm LA Vol (A2C):   39.8 ml 24.59 ml/m  RA Volume:   53.60 ml  33.12 ml/m LA Vol (A4C):   33.4 ml 20.64 ml/m LA Biplane Vol: 39.4 ml 24.34 ml/m AORTIC VALVE AV Vmax:      98.20 cm/s AV Vmean:     74.500 cm/s AV VTI:       0.215 m AV Peak Grad: 3.9 mmHg AV Mean Grad: 2.0 mmHg  AORTA Ao Root diam: 2.90 cm Ao Asc diam:  2.90 cm Ao Desc diam: 1.80 cm  MITRAL VALVE                  TRICUSPID VALVE MV Area (PHT): 2.36 cm       TR Peak grad:   25.4 mmHg MV Decel Time: 322 msec       TR Vmax:        252.00 cm/s MR Peak grad:    114.5 mmHg MR Mean grad:    82.0 mmHg    SHUNTS MR Vmax:         535.00 cm/s  Systemic Diam: 2.00 cm MR Vmean:        429.0 cm/s MR PISA:         1.01 cm MR PISA Eff ROA: 7 mm MR PISA Radius:  0.40 cm MV E velocity: 71.80 cm/s MV A velocity: 80.70 cm/s MV E/A ratio:  0.89  Shelda Bruckner MD Electronically signed by Shelda Bruckner MD Signature Date/Time: 10/11/2021/3:05:24 PM    Final    MONITORS  LONG TERM MONITOR (3-14 DAYS) 01/09/2022  Narrative NSR with sinus brady and sinus tachycardia. Average HR 89 bpm Sustained and non-sustained SVT at rates of120-155/min Frequent PAC's.  Rare PVCs NSVT, longest 16 beats No atrial fib No prolonged pauses Bifid P waves demonstrate intra atrial conduction delay  Vina Gull, MD Patch Wear Time:  14 days and 0 hours (2023-10-26T18:50:32-0400 to 2023-11-09T17:50:36-0500)  Patient had a min HR of 65 bpm, max HR of 207 bpm, and avg HR of 89 bpm. Predominant underlying rhythm was Sinus Rhythm. 2 Ventricular Tachycardia runs occurred, the run with the fastest interval lasting 16  beats with a max rate of 207 bpm (avg 159 bpm); the run with the fastest interval was also the longest. 67 Supraventricular Tachycardia runs occurred, the run with the fastest interval lasting 16 beats with a max rate of 154 bpm, the longest lasting 15  mins 38 secs with an avg rate of 121 bpm. Some episodes of Supraventricular Tachycardia may be possible Atrial Tachycardia with variable block. Junctional Rhythm was present. Supraventricular Tachycardia and Junctional Rhythm were detected within +/- 45 seconds of symptomatic patient event(s). Isolated SVEs were occasional (3.2%, 58047), SVE Couplets were rare (<1.0%, 1004), and SVE Triplets were rare (<1.0%, 217). Isolated VEs were rare (<1.0%), VE Couplets were rare (<1.0%), and no VE Triplets were present.   CT SCANS  CT CORONARY MORPH W/CTA COR W/SCORE 10/17/2021  Addendum 10/20/2021 12:41 PM ADDENDUM REPORT: 10/20/2021 12:39  EXAM: OVER-READ INTERPRETATION  CT CHEST  The following report is an over-read performed by radiologist Dr. Selinda Saas [facility] on 10/20/2021. This over-read does not include interpretation of cardiac or coronary anatomy or pathology. The coronary CTA interpretation by the cardiologist is attached.  COMPARISON:  12/06/2020 chest CT.  FINDINGS: Cardiovascular: Normal heart size. No significant pericardial effusion/thickening. Atherosclerotic nonaneurysmal thoracic aorta. Normal caliber main pulmonary artery (2.7 cm diameter). Disproportionately dilated right pulmonary artery (4.4 cm diameter). No central pulmonary emboli.  Mediastinum/Nodes: Unremarkable esophagus. No pathologically enlarged mediastinal or hilar lymph nodes.  Lungs/Pleura: No pneumothorax. No pleural effusion. Extensive cylindrical and varicoid bronchiectasis scattered throughout the visualized lungs, most prominent in the lingula and right middle lobe, with associated bandlike regions of scarring, patchy tree-in-bud opacities and scattered foci of mucoid impaction. Findings are not appreciably changed since 12/06/2020 chest CT. No new significant pulmonary nodules.  Upper abdomen: No acute abnormality.  Musculoskeletal: No aggressive appearing focal osseous  lesions. Moderate thoracic spondylosis.  IMPRESSION: 1. Chronic extensive cylindrical and varicoid bronchiectasis throughout the visualized lungs with associated bandlike regions of scarring, patchy tree-in-bud opacities and scattered foci of mucoid impaction, not appreciably changed since 12/06/2020 chest CT. Findings are most compatible with chronic atypical mycobacterial infection (MAI). 2. Disproportionately dilated right pulmonary artery, cannot exclude pulmonary arterial hypertension. 3. Aortic Atherosclerosis (ICD10-I70.0).   Electronically Signed By: Selinda DELENA Blue M.D. On: 10/20/2021 12:39  Narrative CLINICAL DATA:  45F with chest pain  EXAM: Cardiac/Coronary CTA  TECHNIQUE: The patient was scanned on a [employer].  FINDINGS: A 100 kV prospective scan was triggered in the descending thoracic aorta at 111 HU's. Axial non-contrast 3 mm slices were carried out through the heart. The data set was analyzed on a dedicated work station and scored using the Agatson method. Gantry rotation speed was 250 msecs and collimation was .6 mm. No beta blockade and 0.8 mg of sl NTG was given. The 3D data set was reconstructed in 5% intervals of the 67-82 % of the R-R cycle. Diastolic phases were analyzed on a dedicated work station using MPR, MIP and VRT modes. The patient received 80 cc of contrast.  Coronary Arteries:  Normal coronary origin.  Right dominance.  RCA is a large dominant artery that gives rise to PDA and PLA. Mixed plaque in proximal RCA causes 0-24% stenosis.  Left main is a large artery that gives rise to LAD and LCX arteries.  LAD is a large vessel. Mixed plaque in proximal LAD causes 25-49% stenosis. Calcified plaque in mid LAD causes 0-24% stenosis.  LCX is a non-dominant artery that gives rise to one large OM1  branch. Mixed plaque in proximal LCX causes 25-49% stenosis.  Other findings:  Left Ventricle: Normal size  Left Atrium: Mild  enlargement  Pulmonary Veins: Normal configuration  Right Ventricle: Mild enlargement  Right Atrium: Mild enlargement  Cardiac valves: Mild AV calcifications  Thoracic aorta: Normal size  Pulmonary Arteries: Right pulmonary artery aneurysm measuring 44mm  Systemic Veins: Normal drainage  Pericardium: Normal thickness  IMPRESSION: 1. Coronary calcium  score of 323. Percentile not available for age>84  2. Normal coronary origin with right dominance.  3. Nonobstructive CAD  4. Mixed plaque in proximal LAD causes mild (25-49%) stenosis  5. Mixed plaque in proximal LCX causes mild (25-49%) stenosis  6. Mixed plaque in proximal RCA causes minimal (0-24%) stenosis.  7. Right pulmonary artery aneurysm measuring 44mm  CAD-RADS 2. Mild non-obstructive CAD (25-49%). Consider non-atherosclerotic causes of chest pain. Consider preventive therapy and risk factor modification.  Electronically Signed: By: Lonni Nanas M.D. On: 10/17/2021 16:05     ______________________________________________________________________________________________       Current Reported Medications:.    Current Meds  Medication Sig   albuterol  (PROVENTIL ) (2.5 MG/3ML) 0.083% nebulizer solution Take 3 mLs (2.5 mg total) by nebulization every 6 (six) hours as needed for wheezing or shortness of breath.   albuterol  (VENTOLIN  HFA) 108 (90 Base) MCG/ACT inhaler TAKE 2 PUFFS BY MOUTH EVERY 6 HOURS AS NEEDED FOR WHEEZE OR SHORTNESS OF BREATH   amoxicillin  (AMOXIL ) 500 MG capsule    ASPIRIN  81 PO Take 1 tablet by mouth daily.   Calcium  Carb-Cholecalciferol  600-800 MG-UNIT TABS Take 1 tablet by mouth daily.   diltiazem  (CARDIZEM  CD) 120 MG 24 hr capsule TAKE 1 CAPSULE BY MOUTH EVERY DAY   guaiFENesin  (MUCINEX ) 600 MG 12 hr tablet Take by mouth 2 (two) times daily.   losartan  (COZAAR ) 25 MG tablet TAKE 0.5 TABLETS BY MOUTH DAILY. PLEASE KEEP YOUR UPCOMING APPOINTMENT FOR FUTURE REFILLS.   LUMIGAN  0.01 % SOLN    Multiple Vitamins-Minerals (MULTIVITAMIN & MINERAL PO) Take 1 tablet by mouth daily.   nitroGLYCERIN  (NITROSTAT ) 0.4 MG SL tablet PLACE 1 TABLET (0.4 MG TOTAL) UNDER THE TONGUE EVERY 5 (FIVE) MINUTES AS NEEDED FOR CHEST PAIN. PLEASE CALL [phone] TO MAKE APPOINTMENT WITH DR. ROSS FOR FUTURE REFILLS.   Respiratory Therapy Supplies (FLUTTER) DEVI 1 application by Does not apply route 3 (three) times daily.   rosuvastatin  (CRESTOR ) 5 MG tablet Take 1 tablet (5 mg total) by mouth daily.   Saccharomyces boulardii (FLORASTOR PO) Take 1 capsule by mouth daily.   sodium chloride  0.9 % nebulizer solution Take 3 mLs by nebulization 2 (two) times daily as needed (cough/congestion).   Tiotropium Bromide-Olodaterol (STIOLTO RESPIMAT ) 2.5-2.5 MCG/ACT AERS Inhale 2 puffs into the lungs daily.   TURMERIC CURCUMIN PO Take by mouth.   VITAMIN D  PO Take 4,000 Int'l Units by mouth.   Zinc 30 MG CAPS    Physical Exam:   VS:  BP 118/66   Pulse 81   Ht 5' 2 (1.575 m)   Wt 127 lb 3.2 oz (57.7 kg)   LMP 02/23/1985   SpO2 97%   BMI 23.27 kg/m    Wt Readings from Last 3 Encounters:  08/30/23 127 lb 3.2 oz (57.7 kg)  07/28/23 128 lb 9.6 oz (58.3 kg)  05/17/23 127 lb (57.6 kg)    GEN: Well nourished, well developed in no acute distress NECK: No JVD; No carotid bruits CARDIAC: RRR, no murmurs, rubs, gallops RESPIRATORY:  Clear to auscultation without rales,  wheezing or rhonchi  ABDOMEN: Soft, non-tender, non-distended EXTREMITIES:  No edema; No acute deformity     Asessement and Plan:.    CAD: Coronary CTA on 10/17/2021 indicated coronary calcium  score of 323, nonobstructive CAD with mixed plaque in proximal LAD with 25 to 49% stenosis, mixed plaque in proximal LCx with 25 to 49% stenosis, next plaque in proximal RCA was 0 to 24% stenosis. Stable with no anginal symptoms. No indication for ischemic evaluation.  Heart healthy diet and regular cardiovascular exercise encouraged.  Continue  aspirin  81 mg daily, diltiazem  120 mg daily, losartan  12.5 mg daily, Crestor  5 mg daily.  Frequent PACs: Noted on cardiac monitor in 2023.  Today patient reports occasional short bursts of palpitations that are overall not bothersome.  She denies any significant changes.  Continue diltiazem  120 mg daily.  HTN: Blood pressure today 118/66.  Continue current antihypertensive regiment.  HLD: Last lipid profile on 02/23/2023 indicated total cholesterol 161, HDL 79, triglycerides 62 and LDL 70.  Continue Crestor  5 mg daily.   Disposition: F/u with Dr. Okey in six months or sooner if needed.   Signed, Yandell Mcjunkins D Jaceon Heiberger, NP

## 2023-08-30 ENCOUNTER — Ambulatory Visit: Attending: Cardiology | Admitting: Cardiology

## 2023-08-30 ENCOUNTER — Encounter: Payer: Self-pay | Admitting: Cardiology

## 2023-08-30 VITALS — BP 118/66 | HR 81 | Ht 62.0 in | Wt 127.2 lb

## 2023-08-30 DIAGNOSIS — I10 Essential (primary) hypertension: Secondary | ICD-10-CM

## 2023-08-30 DIAGNOSIS — E782 Mixed hyperlipidemia: Secondary | ICD-10-CM | POA: Diagnosis not present

## 2023-08-30 DIAGNOSIS — R002 Palpitations: Secondary | ICD-10-CM

## 2023-08-30 DIAGNOSIS — I2510 Atherosclerotic heart disease of native coronary artery without angina pectoris: Secondary | ICD-10-CM | POA: Diagnosis not present

## 2023-08-30 NOTE — Patient Instructions (Signed)
 Medication Instructions:  No changes *If you need a refill on your cardiac medications before your next appointment, please call your pharmacy*  Lab Work: No labs  Testing/Procedures: No testing  Follow-Up: At [facility], you and your health needs are our priority.  As part of our continuing mission to provide you with exceptional heart care, our providers are all part of one team.  This team includes your primary Cardiologist (physician) and Advanced Practice Providers or APPs (Physician Assistants and Nurse Practitioners) who all work together to provide you with the care you need, when you need it.  Your next appointment:   6 month(s)  Provider:   Vina Gull, MD    We recommend signing up for the patient portal called MyChart.  Sign up information is provided on this After Visit Summary.  MyChart is used to connect with patients for Virtual Visits (Telemedicine).  Patients are able to view lab/test results, encounter notes, upcoming appointments, etc.  Non-urgent messages can be sent to your provider as well.   To learn more about what you can do with MyChart, go to [email or web address].

## 2023-09-08 ENCOUNTER — Ambulatory Visit (INDEPENDENT_AMBULATORY_CARE_PROVIDER_SITE_OTHER): Admitting: Podiatry

## 2023-09-08 ENCOUNTER — Ambulatory Visit (INDEPENDENT_AMBULATORY_CARE_PROVIDER_SITE_OTHER)

## 2023-09-08 DIAGNOSIS — M216X1 Other acquired deformities of right foot: Secondary | ICD-10-CM

## 2023-09-08 DIAGNOSIS — M216X2 Other acquired deformities of left foot: Secondary | ICD-10-CM

## 2023-09-08 DIAGNOSIS — Z01818 Encounter for other preprocedural examination: Secondary | ICD-10-CM

## 2023-09-08 NOTE — Progress Notes (Signed)
 Subjective:  Patient ID: Miranda Thompson, female    DOB: 05-04-1933,  MRN: [id number]  Chief Complaint  Patient presents with   Callouses    Bilateral calluses     89 y.o. female presents with the above complaint.  Patient presents with bilateral plantarflexed fifth metatarsal painful to touch is progressing and worse worse with ambulation she has tried shaving it down debridement and shoe gear modification none of which has helped she would like to discuss surgical options at this time she denies any other acute complaints pain scale 7 out of 10 dull aching nature   Review of Systems: Negative except as noted in the HPI. Denies N/V/F/Ch.  Past Medical History:  Diagnosis Date   Adenomatous colon polyp    Allergic rhinitis    Anemia    pt states no anemia in her past hx thst she is aware of    Atrial fibrillation (HCC)    goes in and out of this rhythm- takes Tenormin    Breast cancer, left breast (HCC) 1994 with lumpectomy   chemo,tamox,radiation   Bronchiectasis with acute exacerbation (HCC)    COPD (chronic obstructive pulmonary disease) (HCC)    Coronary artery disease    Diverticulosis of colon    DJD (degenerative joint disease)    Dysrhythmia    palpitations   Family history of brain cancer    Family history of colon cancer 02/14/2018   Family history of colon cancer    Family history of malignant neoplasm of gastrointestinal tract    Family history of melanoma    Family history of uterine cancer    Fibromyalgia    GERD (gastroesophageal reflux disease) 02/14/2018   Glaucoma    Hemoptysis    History of pneumonia    Hypercholesteremia    pt denies   Hypertension    Low back pain syndrome    Neuromuscular disorder (HCC)    hx fibromyalgia    Osteopenia    Pneumonia    hx   Pseudomonas infection    Rotator cuff syndrome of right shoulder    Spinal stenosis     Current Outpatient Medications:    albuterol  (PROVENTIL ) (2.5 MG/3ML) 0.083% nebulizer solution,  Take 3 mLs (2.5 mg total) by nebulization every 6 (six) hours as needed for wheezing or shortness of breath., Disp: 75 mL, Rfl: 12   albuterol  (VENTOLIN  HFA) 108 (90 Base) MCG/ACT inhaler, TAKE 2 PUFFS BY MOUTH EVERY 6 HOURS AS NEEDED FOR WHEEZE OR SHORTNESS OF BREATH, Disp: 8.5 each, Rfl: 6   amoxicillin  (AMOXIL ) 500 MG capsule, , Disp: , Rfl:    ASPIRIN  81 PO, Take 1 tablet by mouth daily., Disp: , Rfl:    Calcium  Carb-Cholecalciferol  600-800 MG-UNIT TABS, Take 1 tablet by mouth daily., Disp: , Rfl:    diltiazem  (CARDIZEM  CD) 120 MG 24 hr capsule, TAKE 1 CAPSULE BY MOUTH EVERY DAY, Disp: 90 capsule, Rfl: 3   guaiFENesin  (MUCINEX ) 600 MG 12 hr tablet, Take by mouth 2 (two) times daily., Disp: , Rfl:    losartan  (COZAAR ) 25 MG tablet, TAKE 0.5 TABLETS BY MOUTH DAILY. PLEASE KEEP YOUR UPCOMING APPOINTMENT FOR FUTURE REFILLS., Disp: 45 tablet, Rfl: 2   LUMIGAN 0.01 % SOLN, , Disp: , Rfl:    Multiple Vitamins-Minerals (MULTIVITAMIN & MINERAL PO), Take 1 tablet by mouth daily., Disp: , Rfl:    nitroGLYCERIN  (NITROSTAT ) 0.4 MG SL tablet, PLACE 1 TABLET (0.4 MG TOTAL) UNDER THE TONGUE EVERY 5 (FIVE) MINUTES AS NEEDED FOR  CHEST PAIN. PLEASE CALL [phone] TO MAKE APPOINTMENT WITH DR. ROSS FOR FUTURE REFILLS., Disp: 25 tablet, Rfl: 5   Respiratory Therapy Supplies (FLUTTER) DEVI, 1 application by Does not apply route 3 (three) times daily., Disp: 1 each, Rfl: 0   rosuvastatin  (CRESTOR ) 5 MG tablet, Take 1 tablet (5 mg total) by mouth daily., Disp: 90 tablet, Rfl: 3   Saccharomyces boulardii (FLORASTOR PO), Take 1 capsule by mouth daily., Disp: , Rfl:    sodium chloride  0.9 % nebulizer solution, Take 3 mLs by nebulization 2 (two) times daily as needed (cough/congestion)., Disp: 90 mL, Rfl: 12   Tiotropium Bromide-Olodaterol (STIOLTO RESPIMAT ) 2.5-2.5 MCG/ACT AERS, Inhale 2 puffs into the lungs daily., Disp: 12 g, Rfl: 1   TURMERIC CURCUMIN PO, Take by mouth., Disp: , Rfl:    VITAMIN D  PO, Take 4,000 Int'l  Units by mouth., Disp: , Rfl:    Zinc 30 MG CAPS, , Disp: , Rfl:   Social History   Tobacco Use  Smoking Status Former   Current packs/day: 0.00   Types: Cigarettes   Quit date: 02/24/1967   Years since quitting: 56.5  Smokeless Tobacco Never    No Known Allergies Objective:  There were no vitals filed for this visit. There is no height or weight on file to calculate BMI. Constitutional Well developed. Well nourished.  Vascular Dorsalis pedis pulses palpable bilaterally. Posterior tibial pulses palpable bilaterally. Capillary refill normal to all digits.  No cyanosis or clubbing noted. Pedal hair growth normal.  Neurologic Normal speech. Oriented to person, place, and time. Epicritic sensation to light touch grossly present bilaterally.  Dermatologic Nails well groomed and normal in appearance. No open wounds. No skin lesions.  Orthopedic: Bilateral plantarflexed fifth metatarsal pain on palpation to submetatarsal 5.  Porokeratotic lesion noted to subdorsal 5.  No open wounds or lesion noted.   Radiographs: 3 views of skeletally mature bilateral foot: Plantarflexed fifth metatarsal noted bilaterally.  Midfoot arthritis noted no other bony abnormalities identified Assessment:   1. Plantar flexed metatarsal bone of right foot   2. Encounter for preoperative examination for general surgical procedure   3. Plantar flexed metatarsal bone of left foot    Plan:  Patient was evaluated and treated and all questions answered.  Bilateral plantarflexed fifth metatarsal - All questions and concerns were discussed with the patient in extensive detail - Given the amount of pain that she is having in the setting of failing conservative care including shoe gear modification padding protecting offloading injection patient will benefit from surgical intervention - She would benefit from bilateral floating osteotomy of the fifth metatarsal.  I discussed my preoperative IntraOp postoperative  plan with the patient in extensive detail she states understand like to proceed with surgery -Informed surgical risk consent was reviewed and read aloud to the patient.  I reviewed the films.  I have discussed my findings with the patient in great detail.  I have discussed all risks including but not limited to infection, stiffness, scarring, limp, disability, deformity, damage to blood vessels and nerves, numbness, poor healing, need for braces, arthritis, chronic pain, amputation, death.  All benefits and realistic expectations discussed in great detail.  I have made no promises as to the outcome.  I have provided realistic expectations.  I have offered the patient a 2nd opinion, which they have declined and assured me they preferred to proceed despite the risks   No follow-ups on file.

## 2023-09-13 ENCOUNTER — Telehealth: Payer: Self-pay | Admitting: Podiatry

## 2023-09-13 NOTE — Telephone Encounter (Signed)
 RECEIVED SURGICAL CONSENT FORM  Left message for pt to call me back to get surgery scheduled.

## 2023-09-16 ENCOUNTER — Telehealth: Payer: Self-pay | Admitting: Podiatry

## 2023-09-16 NOTE — Telephone Encounter (Signed)
 Pt left message yesterday stating she was returning my call to get scheduled for surgery and she has reviewed the recovery information and is not wanting to proceed with the surgery. Also her husband is having surgery in August and sept as well.  I returned call and it went to voicemail and I explained that I got her message and that I would pass this information to Dr Tobie that she is not wanting to proceed with the surgery.

## 2023-09-19 DIAGNOSIS — J471 Bronchiectasis with (acute) exacerbation: Secondary | ICD-10-CM | POA: Diagnosis not present

## 2023-09-22 ENCOUNTER — Ambulatory Visit: Payer: Self-pay | Admitting: Podiatry

## 2023-10-03 ENCOUNTER — Other Ambulatory Visit: Payer: Self-pay | Admitting: Internal Medicine

## 2023-10-04 ENCOUNTER — Encounter: Payer: Self-pay | Admitting: Nurse Practitioner

## 2023-10-05 ENCOUNTER — Ambulatory Visit
Admission: RE | Admit: 2023-10-05 | Discharge: 2023-10-05 | Disposition: A | Discharge status: Home / Self Care | Source: Ambulatory Visit | Attending: Psychiatry | Admitting: Psychiatry

## 2023-10-05 ENCOUNTER — Ambulatory Visit
Admission: RE | Admit: 2023-10-05 | Discharge: 2023-10-05 | Disposition: A | Discharge status: Home / Self Care | Source: Ambulatory Visit | Attending: Nurse Practitioner | Admitting: Nurse Practitioner

## 2023-10-05 DIAGNOSIS — R918 Other nonspecific abnormal finding of lung field: Secondary | ICD-10-CM

## 2023-10-05 DIAGNOSIS — K5730 Diverticulosis of large intestine without perforation or abscess without bleeding: Secondary | ICD-10-CM | POA: Diagnosis not present

## 2023-10-05 DIAGNOSIS — J479 Bronchiectasis, uncomplicated: Secondary | ICD-10-CM

## 2023-10-05 DIAGNOSIS — R978 Other abnormal tumor markers: Secondary | ICD-10-CM

## 2023-10-05 DIAGNOSIS — N83292 Other ovarian cyst, left side: Secondary | ICD-10-CM | POA: Diagnosis not present

## 2023-10-05 DIAGNOSIS — N83202 Unspecified ovarian cyst, left side: Secondary | ICD-10-CM

## 2023-10-05 MED ORDER — IOPAMIDOL (ISOVUE-300) INJECTION 61%
100.0000 mL | Freq: Once | INTRAVENOUS | Status: AC | PRN
  Administered 2023-10-05 (×2): 100 mL via INTRAVENOUS

## 2023-10-08 NOTE — Progress Notes (Unsigned)
 GYNECOLOGY  VISIT   HPI: 89 y.o.   Married  Caucasian female   [phone] with Patient's last menstrual period was 02/23/1985.   here for: UTI- Pelvic pressure, frequent urination and urgency, cloudy urine. Symptoms started 10/05/23.  Daughter is present for the visit today.      No burning with urination.   Feels distended, some low back pain, and malaise.    Temp 98.2. No chills.    No nausea.  Followed by Dr. Eldonna for a left adnexal cyst and elevated CA125.  Level was 37 on 03/25/23 and 54 on 05/04/23. She just had a CT of abdomen and pelvis done and she will follow up with Dr. Eldonna.   GYNECOLOGIC HISTORY: Patient's last menstrual period was 02/23/1985. Contraception:  PMP Menopausal hormone therapy:  n/a Last 2 paps:  06/24/17 neg  History of abnormal Pap or positive HPV:  no Mammogram:  11/05/22 Breast Density cat c, BIRADS Cat 2 benign         OB History     Gravida  5   Para  5   Term  5   Preterm      AB      Living  5      SAB      IAB      Ectopic      Multiple      Live Births  5              Patient Active Problem List   Diagnosis Date Noted   Pseudomonas aeruginosa colonization 04/29/2023   Lung nodules 04/29/2023   Wheezing 01/14/2023   Productive cough 01/10/2023   Suprapubic mass 10/22/2022   RUQ pain 10/22/2022   Dyspnea 10/22/2022   UTI (urinary tract infection) 10/22/2022   Aphthous stomatitis 04/02/2022   DOE (dyspnea on exertion) 01/02/2022   Cervicalgia 10/02/2021   Low back pain 09/15/2020   Aortic atherosclerosis (HCC) 09/11/2020   Submandibular gland mass 03/20/2020   Hyponatremia 03/20/2020   Hyperglycemia 02/20/2019   Genetic testing 11/11/2018   Family history of uterine cancer    Family history of brain cancer    GERD (gastroesophageal reflux disease) 02/14/2018   Glaucoma 02/14/2018   Family history of colon cancer 02/14/2018   Encounter for well adult exam with abnormal findings 02/14/2018   Osteoporosis  02/14/2018   History of left breast cancer 01/13/2018   S/P knee replacement 03/23/2016   S/P shoulder replacement 08/02/2015   Palpitations 08/20/2014   Episode of hypertension 08/20/2014   COPD, moderate (HCC) 05/09/2014   Premature atrial contractions 11/08/2013   Cardiac arrhythmia 05/10/2013   Trochanteric bursitis of left hip 02/02/2012   Osteoarthritis of left hip 06/25/2011   Leg length inequality 06/25/2011   Left ovarian cyst 01/20/2011   Elevated lipids 11/04/2009   Acute cystitis 11/04/2009   LOW BACK PAIN SYNDROME 05/07/2008   Bronchiectasis (HCC) 03/18/2007   Hemoptysis 03/18/2007   PNEUMONIA 03/07/2007   Disorder of bone and cartilage 03/07/2007   BRONCHIECTASIS 03/04/2007   Osteoarthritis 03/04/2007    Past Medical History:  Diagnosis Date   Adenomatous colon polyp    Allergic rhinitis    Anemia    pt states no anemia in her past hx thst she is aware of    Atrial fibrillation (HCC)    goes in and out of this rhythm- takes Tenormin    Breast cancer, left breast (HCC) 1994 with lumpectomy   chemo,tamox,radiation   Bronchiectasis with acute exacerbation (  HCC)    COPD (chronic obstructive pulmonary disease) (HCC)    Coronary artery disease    Diverticulosis of colon    DJD (degenerative joint disease)    Dysrhythmia    palpitations   Family history of brain cancer    Family history of colon cancer 02/14/2018   Family history of colon cancer    Family history of malignant neoplasm of gastrointestinal tract    Family history of melanoma    Family history of uterine cancer    Fibromyalgia    GERD (gastroesophageal reflux disease) 02/14/2018   Glaucoma    Hemoptysis    History of pneumonia    Hypercholesteremia    pt denies   Hypertension    Low back pain syndrome    Neuromuscular disorder (HCC)    hx fibromyalgia    Osteopenia    Pneumonia    hx   Pseudomonas infection    Rotator cuff syndrome of right shoulder    Spinal stenosis     Past  Surgical History:  Procedure Laterality Date   CATARACT EXTRACTION Right    COLONOSCOPY     left breast lumpectomy and LN dissection  06/1992   Dr. Ethyl   LUNG BIOPSY  1968   TSurg---Dr. Vivian   LUNG BIOPSY  2012   dr christi   POLYPECTOMY     REVERSE SHOULDER ARTHROPLASTY Right 08/02/2015   Procedure: RIGHT REVERSE TOTAL SHOULDER ARTHROPLASTY;  Surgeon: Marcey Her, MD;  Location[facility] OR;  Service: Orthopedics;  Laterality: Right;   REVERSE TOTAL SHOULDER ARTHROPLASTY Right 08/02/2015   TONSILLECTOMY     TOTAL KNEE ARTHROPLASTY Right 01/21/2015   Procedure: TOTAL RIGHT KNEE ARTHROPLASTY;  Surgeon: Donnice Car, MD;  Location: [facility] ORS;  Service: Orthopedics;  Laterality: Right;   TOTAL KNEE ARTHROPLASTY Left 03/23/2016   Procedure: LEFT TOTAL KNEE ARTHROPLASTY;  Surgeon: Donnice Car, MD;  Location: [facility] ORS;  Service: Orthopedics;  Laterality: Left;    Current Outpatient Medications  Medication Sig Dispense Refill   albuterol  (PROVENTIL ) (2.5 MG/3ML) 0.083% nebulizer solution Take 3 mLs (2.5 mg total) by nebulization every 6 (six) hours as needed for wheezing or shortness of breath. 75 mL 12   albuterol  (VENTOLIN  HFA) 108 (90 Base) MCG/ACT inhaler TAKE 2 PUFFS BY MOUTH EVERY 6 HOURS AS NEEDED FOR WHEEZE OR SHORTNESS OF BREATH 8.5 each 6   amoxicillin  (AMOXIL ) 500 MG capsule      ASPIRIN  81 PO Take 1 tablet by mouth daily.     Calcium  Carb-Cholecalciferol  600-800 MG-UNIT TABS Take 1 tablet by mouth daily.     diltiazem  (CARDIZEM  CD) 120 MG 24 hr capsule TAKE 1 CAPSULE BY MOUTH EVERY DAY 90 capsule 3   guaiFENesin  (MUCINEX ) 600 MG 12 hr tablet Take by mouth 2 (two) times daily.     losartan  (COZAAR ) 25 MG tablet TAKE 0.5 TABLETS BY MOUTH DAILY. PLEASE KEEP YOUR UPCOMING APPOINTMENT FOR FUTURE REFILLS. 45 tablet 0   LUMIGAN 0.01 % SOLN      Multiple Vitamins-Minerals (MULTIVITAMIN & MINERAL PO) Take 1 tablet by mouth daily.     nitroGLYCERIN  (NITROSTAT ) 0.4 MG SL tablet PLACE 1 TABLET (0.4  MG TOTAL) UNDER THE TONGUE EVERY 5 (FIVE) MINUTES AS NEEDED FOR CHEST PAIN. PLEASE CALL [phone] TO MAKE APPOINTMENT WITH DR. ROSS FOR FUTURE REFILLS. 25 tablet 5   Respiratory Therapy Supplies (FLUTTER) DEVI 1 application by Does not apply route 3 (three) times daily. 1 each 0   rosuvastatin  (CRESTOR ) 5 MG  tablet Take 1 tablet (5 mg total) by mouth daily. 90 tablet 3   Saccharomyces boulardii (FLORASTOR PO) Take 1 capsule by mouth daily.     sodium chloride  0.9 % nebulizer solution Take 3 mLs by nebulization 2 (two) times daily as needed (cough/congestion). 90 mL 12   Tiotropium Bromide-Olodaterol (STIOLTO RESPIMAT ) 2.5-2.5 MCG/ACT AERS Inhale 2 puffs into the lungs daily. 12 g 1   TURMERIC CURCUMIN PO Take by mouth.     VITAMIN D  PO Take 4,000 Int'l Units by mouth.     Zinc 30 MG CAPS      No current facility-administered medications for this visit.     ALLERGIES: Patient has no known allergies.  Family History  Problem Relation Age of Onset   Heart attack Mother        Deceased age 76   Colon cancer Father 68       deceased age 52 from colon ca.   Hypertension Brother    Stroke Maternal Grandmother    Colon polyps Daughter    Uterine cancer Daughter 80   Melanoma Daughter 61   Brain cancer Maternal Uncle    Other Paternal Uncle 12       possible cancer   Melanoma Niece 27       d. 56   Esophageal cancer Neg Hx    Stomach cancer Neg Hx    Rectal cancer Neg Hx    Ovarian cancer Neg Hx    Prostate cancer Neg Hx    Breast cancer Neg Hx     Social History   Socioeconomic History   Marital status: Married    Spouse name: Ryan   Number of children: 5   Years of education: Not on file   Highest education level: Not on file  Occupational History   Occupation: Retired  Tobacco Use   Smoking status: Former    Current packs/day: 0.00    Types: Cigarettes    Quit date: 02/24/1967    Years since quitting: 56.6   Smokeless tobacco: Never  Vaping Use   Vaping status:  Never Used  Substance and Sexual Activity   Alcohol use: Yes    Comment: occasionally   Drug use: No   Sexual activity: Not Currently    Partners: Male    Birth control/protection: Post-menopausal    Comment: husband vasectomy, older than 16, less than 5  Other Topics Concern   Not on file  Social History Narrative   Lives with husband.   Social Drivers of Corporate investment banker Strain: Low Risk  (03/24/2023)   Overall Financial Resource Strain (CARDIA)    Difficulty of Paying Living Expenses: Not hard at all  Food Insecurity: No Food Insecurity (03/24/2023)   Hunger Vital Sign    Worried About Running Out of Food in the Last Year: Never true    Ran Out of Food in the Last Year: Never true  Transportation Needs: No Transportation Needs (03/24/2023)   PRAPARE - Administrator, Civil Service (Medical): No    Lack of Transportation (Non-Medical): No  Physical Activity: Sufficiently Active (03/24/2023)   Exercise Vital Sign    Days of Exercise per Week: 5 days    Minutes of Exercise per Session: 50 min  Stress: No Stress Concern Present (03/24/2023)   [employer] of Occupational Health - Occupational Stress Questionnaire    Feeling of Stress : Only a little  Social Connections: Moderately Isolated (03/24/2023)   Social Connection  and Isolation Panel    Frequency of Communication with Friends and Family: More than three times a week    Frequency of Social Gatherings with Friends and Family: More than three times a week    Attends Religious Services: Never    Database administrator or Organizations: No    Attends Banker Meetings: Never    Marital Status: Married  Catering manager Violence: Not At Risk (03/24/2023)   Humiliation, Afraid, Rape, and Kick questionnaire    Fear of Current or Ex-Partner: No    Emotionally Abused: No    Physically Abused: No    Sexually Abused: No    Review of Systems  All other systems reviewed and are  negative.   PHYSICAL EXAMINATION:   BP 118/68 (BP Location: Left Arm, Patient Position: Sitting)   Pulse 90   Temp 98 F (36.7 C) (Oral)   LMP 02/23/1985   SpO2 96%     General appearance: alert, cooperative and appears stated age   Urinalysis:  sg 1.015, ph 7.0, trace protein, positive nitrites, packed with WBC, 2 0 - 40 RBC, 0 - 5 epis, many bacteria, odor present.   ASSESSMENT:  Cystitis.  Urinary urgency and frequency.   PLAN:  Urinalysis and reflex culture.  Start Augmentin  500/125 mg po bid x 7 days.  Disp:  14, RF one.  Hydrate well.  Contact the office if no improvement in 48 hours.  FU prn.   30 min  total time was spent for this patient encounter, including preparation, face-to-face counseling with the patient, coordination of care, and documentation of the encounter.

## 2023-10-11 ENCOUNTER — Ambulatory Visit: Payer: Self-pay | Admitting: Obstetrics and Gynecology

## 2023-10-11 ENCOUNTER — Ambulatory Visit (INDEPENDENT_AMBULATORY_CARE_PROVIDER_SITE_OTHER): Admitting: Obstetrics and Gynecology

## 2023-10-11 ENCOUNTER — Encounter: Payer: Self-pay | Admitting: Obstetrics and Gynecology

## 2023-10-11 VITALS — BP 118/68 | HR 90 | Temp 98.0°F

## 2023-10-11 DIAGNOSIS — R300 Dysuria: Secondary | ICD-10-CM | POA: Diagnosis not present

## 2023-10-11 DIAGNOSIS — N3090 Cystitis, unspecified without hematuria: Secondary | ICD-10-CM | POA: Diagnosis not present

## 2023-10-11 DIAGNOSIS — R3915 Urgency of urination: Secondary | ICD-10-CM

## 2023-10-11 MED ORDER — AMOXICILLIN-POT CLAVULANATE 500-125 MG PO TABS: 1.0000 | ORAL_TABLET | Freq: Two times a day (BID) | ORAL | 0 refills | Status: DC

## 2023-10-11 NOTE — Patient Instructions (Signed)

## 2023-10-12 ENCOUNTER — Encounter: Payer: Self-pay | Admitting: Psychiatry

## 2023-10-12 ENCOUNTER — Telehealth: Payer: Self-pay | Admitting: Oncology

## 2023-10-12 ENCOUNTER — Ambulatory Visit: Payer: Self-pay | Admitting: Psychiatry

## 2023-10-12 NOTE — Telephone Encounter (Signed)
 [facility] Radiology and spoke to [employer]. Asked if Dr. Graham can clarify the findings Other pelvic mass or free fluid identified in the reproductive section of the CT abdomen pelvis from 10/05/2023.  He will send a message to Dr. Graham to addend the report.

## 2023-10-14 ENCOUNTER — Encounter: Payer: Self-pay | Admitting: Psychiatry

## 2023-10-14 LAB — URINALYSIS, COMPLETE W/RFL CULTURE
Bilirubin Urine: NEGATIVE
Glucose, UA: NEGATIVE
Hyaline Cast: NONE SEEN /LPF
Ketones, ur: NEGATIVE
Nitrites, Initial: POSITIVE — AB
Specific Gravity, Urine: 1.015 (ref 1.001–1.035)
pH: 7 (ref 5.0–8.0)

## 2023-10-14 LAB — URINE CULTURE
MICRO NUMBER:: 16845357
SPECIMEN QUALITY:: ADEQUATE

## 2023-10-14 LAB — CULTURE INDICATED

## 2023-10-14 MED ORDER — NITROFURANTOIN MONOHYD MACRO 100 MG PO CAPS: 100.0000 mg | ORAL_CAPSULE | Freq: Two times a day (BID) | ORAL | 0 refills | Status: AC

## 2023-10-15 ENCOUNTER — Ambulatory Visit (INDEPENDENT_AMBULATORY_CARE_PROVIDER_SITE_OTHER): Admitting: Pulmonary Disease

## 2023-10-15 ENCOUNTER — Encounter: Payer: Self-pay | Admitting: Pulmonary Disease

## 2023-10-15 VITALS — BP 144/65 | HR 81 | Temp 97.6°F | Ht 63.0 in | Wt 128.0 lb

## 2023-10-15 DIAGNOSIS — J449 Chronic obstructive pulmonary disease, unspecified: Secondary | ICD-10-CM | POA: Diagnosis not present

## 2023-10-15 MED ORDER — STIOLTO RESPIMAT 2.5-2.5 MCG/ACT IN AERS: 2.0000 | INHALATION_SPRAY | Freq: Every day | RESPIRATORY_TRACT | 3 refills | Status: AC

## 2023-10-15 NOTE — Progress Notes (Signed)
 Subjective:   PATIENT ID: Miranda Thompson GENDER: female DOB: [birth year], MRN: [id number]   HPI Discussed the use of AI scribe software for clinical note transcription with the patient, who gave verbal consent to proceed.  History of Present Illness   Miranda Thompson is an 89 year old woman, former smoker with COPD, bronchiectasis and lung nodules who returns to pulmonary clinic for follow up.   She was last seen by Izetta Rouleau, NP 07/28/23.   She experiences intermittent hemoptysis over the past eight days, with episodes of both light and dark blood. She also has fatigue and shortness of breath. She uses a nebulizer and a flutter valve daily but has not been using her albuterol  inhaler recently.  She is currently on Macrobid  for a urinary tract infection after amoxiclav was ineffective. She notes minimal improvement with persistent fatigue and headaches.  A CT scan has been performed, with the report pending. CT Chest overall looks stable compared to prior scan this year. She has a history of Pseudomonas infection in the lungs, resistant to oral antibiotics.      Past Medical History:  Diagnosis Date   Adenomatous colon polyp    Allergic rhinitis    Anemia    pt states no anemia in her past hx thst she is aware of    Atrial fibrillation (HCC)    goes in and out of this rhythm- takes Tenormin    Breast cancer, left breast (HCC) 1994 with lumpectomy   chemo,tamox,radiation   Bronchiectasis with acute exacerbation (HCC)    COPD (chronic obstructive pulmonary disease) (HCC)    Coronary artery disease    Diverticulosis of colon    DJD (degenerative joint disease)    Dysrhythmia    palpitations   Family history of brain cancer    Family history of colon cancer 02/14/2018   Family history of colon cancer    Family history of malignant neoplasm of gastrointestinal tract    Family history of melanoma    Family history of uterine cancer    Fibromyalgia    GERD (gastroesophageal  reflux disease) 02/14/2018   Glaucoma    Hemoptysis    History of pneumonia    Hypercholesteremia    pt denies   Hypertension    Low back pain syndrome    Neuromuscular disorder (HCC)    hx fibromyalgia    Osteopenia    Pneumonia    hx   Pseudomonas infection    Rotator cuff syndrome of right shoulder    Spinal stenosis      Family History  Problem Relation Age of Onset   Heart attack Mother        Deceased age 39   Colon cancer Father 52       deceased age 51 from colon ca.   Hypertension Brother    Stroke Maternal Grandmother    Colon polyps Daughter    Uterine cancer Daughter 24   Melanoma Daughter 68   Brain cancer Maternal Uncle    Other Paternal Uncle 12       possible cancer   Melanoma Niece 41       d. 78   Esophageal cancer Neg Hx    Stomach cancer Neg Hx    Rectal cancer Neg Hx    Ovarian cancer Neg Hx    Prostate cancer Neg Hx    Breast cancer Neg Hx      Social History   Socioeconomic History   Marital status:  Married    Spouse name: Ryan   Number of children: 5   Years of education: Not on file   Highest education level: Not on file  Occupational History   Occupation: Retired  Tobacco Use   Smoking status: Former    Current packs/day: 0.00    Types: Cigarettes    Quit date: 02/24/1967    Years since quitting: 56.6   Smokeless tobacco: Never  Vaping Use   Vaping status: Never Used  Substance and Sexual Activity   Alcohol use: Yes    Comment: occasionally   Drug use: No   Sexual activity: Not Currently    Partners: Male    Birth control/protection: Post-menopausal    Comment: husband vasectomy, older than 16, less than 5  Other Topics Concern   Not on file  Social History Narrative   Lives with husband.   Social Drivers of Corporate investment banker Strain: Low Risk  (03/24/2023)   Overall Financial Resource Strain (CARDIA)    Difficulty of Paying Living Expenses: Not hard at all  Food Insecurity: No Food Insecurity (03/24/2023)    Hunger Vital Sign    Worried About Running Out of Food in the Last Year: Never true    Ran Out of Food in the Last Year: Never true  Transportation Needs: No Transportation Needs (03/24/2023)   PRAPARE - Administrator, Civil Service (Medical): No    Lack of Transportation (Non-Medical): No  Physical Activity: Sufficiently Active (03/24/2023)   Exercise Vital Sign    Days of Exercise per Week: 5 days    Minutes of Exercise per Session: 50 min  Stress: No Stress Concern Present (03/24/2023)   [employer] of Occupational Health - Occupational Stress Questionnaire    Feeling of Stress : Only a little  Social Connections: Moderately Isolated (03/24/2023)   Social Connection and Isolation Panel    Frequency of Communication with Friends and Family: More than three times a week    Frequency of Social Gatherings with Friends and Family: More than three times a week    Attends Religious Services: Never    Database administrator or Organizations: No    Attends Banker Meetings: Never    Marital Status: Married  Catering manager Violence: Not At Risk (03/24/2023)   Humiliation, Afraid, Rape, and Kick questionnaire    Fear of Current or Ex-Partner: No    Emotionally Abused: No    Physically Abused: No    Sexually Abused: No     No Known Allergies   Outpatient Medications Prior to Visit  Medication Sig Dispense Refill   albuterol  (PROVENTIL ) (2.5 MG/3ML) 0.083% nebulizer solution Take 3 mLs (2.5 mg total) by nebulization every 6 (six) hours as needed for wheezing or shortness of breath. 75 mL 12   albuterol  (VENTOLIN  HFA) 108 (90 Base) MCG/ACT inhaler TAKE 2 PUFFS BY MOUTH EVERY 6 HOURS AS NEEDED FOR WHEEZE OR SHORTNESS OF BREATH 8.5 each 6   amoxicillin  (AMOXIL ) 500 MG capsule      ASPIRIN  81 PO Take 1 tablet by mouth daily.     Calcium  Carb-Cholecalciferol  600-800 MG-UNIT TABS Take 1 tablet by mouth daily.     diltiazem  (CARDIZEM  CD) 120 MG 24 hr capsule TAKE  1 CAPSULE BY MOUTH EVERY DAY 90 capsule 3   guaiFENesin  (MUCINEX ) 600 MG 12 hr tablet Take by mouth 2 (two) times daily.     losartan  (COZAAR ) 25 MG tablet TAKE 0.5 TABLETS BY  MOUTH DAILY. PLEASE KEEP YOUR UPCOMING APPOINTMENT FOR FUTURE REFILLS. 45 tablet 0   LUMIGAN 0.01 % SOLN      Multiple Vitamins-Minerals (MULTIVITAMIN & MINERAL PO) Take 1 tablet by mouth daily.     nitrofurantoin , macrocrystal-monohydrate, (MACROBID ) 100 MG capsule Take 1 capsule (100 mg total) by mouth 2 (two) times daily for 5 days. 10 capsule 0   nitroGLYCERIN  (NITROSTAT ) 0.4 MG SL tablet PLACE 1 TABLET (0.4 MG TOTAL) UNDER THE TONGUE EVERY 5 (FIVE) MINUTES AS NEEDED FOR CHEST PAIN. PLEASE CALL [phone] TO MAKE APPOINTMENT WITH DR. ROSS FOR FUTURE REFILLS. 25 tablet 5   Respiratory Therapy Supplies (FLUTTER) DEVI 1 application by Does not apply route 3 (three) times daily. 1 each 0   rosuvastatin  (CRESTOR ) 5 MG tablet Take 1 tablet (5 mg total) by mouth daily. 90 tablet 3   Saccharomyces boulardii (FLORASTOR PO) Take 1 capsule by mouth daily.     sodium chloride  0.9 % nebulizer solution Take 3 mLs by nebulization 2 (two) times daily as needed (cough/congestion). 90 mL 12   TURMERIC CURCUMIN PO Take by mouth.     VITAMIN D  PO Take 4,000 Int'l Units by mouth.     Zinc 30 MG CAPS      Tiotropium Bromide-Olodaterol (STIOLTO RESPIMAT ) 2.5-2.5 MCG/ACT AERS Inhale 2 puffs into the lungs daily. 12 g 1   No facility-administered medications prior to visit.    Review of Systems  Constitutional:  Negative for chills, fever, malaise/fatigue and weight loss.  HENT:  Negative for congestion, sinus pain and sore throat.   Eyes: Negative.   Respiratory:  Positive for cough, hemoptysis and wheezing. Negative for sputum production and shortness of breath.   Cardiovascular:  Negative for chest pain, palpitations, orthopnea, claudication and leg swelling.  Gastrointestinal:  Negative for abdominal pain, heartburn, nausea and  vomiting.  Genitourinary: Negative.   Musculoskeletal:  Negative for joint pain and myalgias.  Skin:  Negative for rash.  Neurological:  Negative for weakness.  Endo/Heme/Allergies: Negative.   Psychiatric/Behavioral: Negative.      Objective:   Vitals:   10/15/23 1029 10/15/23 1107  BP: (!) 158/71 (!) 144/65  Pulse: 81   Temp: 97.6 F (36.4 C)   SpO2: 95%   Weight: 128 lb (58.1 kg)   Height: 5' 3 (1.6 m)    Physical Exam Constitutional:      General: She is not in acute distress.    Appearance: Normal appearance.  Eyes:     General: No scleral icterus.    Conjunctiva/sclera: Conjunctivae normal.  Cardiovascular:     Rate and Rhythm: Normal rate and regular rhythm.  Pulmonary:     Breath sounds: No wheezing, rhonchi or rales.  Musculoskeletal:     Right lower leg: No edema.     Left lower leg: No edema.  Skin:    General: Skin is warm and dry.  Neurological:     General: No focal deficit present.    CBC    Component Value Date/Time   WBC 6.7 10/22/2022 1402   RBC 3.79 (L) 10/22/2022 1402   HGB 12.5 10/22/2022 1402   HGB 12.3 04/10/2021 1538   HCT 37.2 10/22/2022 1402   HCT 35.8 04/10/2021 1538   PLT 272.0 10/22/2022 1402   PLT 252 04/10/2021 1538   MCV 98.1 10/22/2022 1402   MCV 96 04/10/2021 1538   MCH 32.9 04/10/2021 1538   MCH 32.3 10/11/2016 1131   MCHC 33.5 10/22/2022 1402   RDW 12.6  10/22/2022 1402   RDW 11.4 (L) 04/10/2021 1538   LYMPHSABS 1.2 10/22/2022 1402   MONOABS 0.6 10/22/2022 1402   EOSABS 0.2 10/22/2022 1402   BASOSABS 0.1 10/22/2022 1402      Latest Ref Rng & Units 10/22/2022    2:02 PM 03/24/2022   10:03 AM 10/02/2021    3:47 PM  BMP  Glucose 70 - 99 mg/dL 88  88  93   BUN 6 - 23 mg/dL 14  15  11    Creatinine 0.40 - 1.20 mg/dL 9.36  9.40  9.40   Sodium 135 - 145 mEq/L 129  131  131   Potassium 3.5 - 5.1 mEq/L 4.0  4.4  4.3   Chloride 96 - 112 mEq/L 94  96  95   CO2 19 - 32 mEq/L 29  29  30    Calcium  8.4 - 10.5 mg/dL 9.4  9.2   9.3    Chest imaging: CT Chest 04/20/23 *Extensive chronic interstitial lung disease with extensive fibro atelectatic and bronchiectatic changes of the left upper lobe with retraction of the hilum and mediastinum to the left upper lobe. *Several small subpleural pulmonary nodules between 3 and 5 mm bilaterally at the level of both lower lobes. As well as several inflammatory or postinflammatory pulmonary nodular changes of the right middle lobe and right upper lobe. *Postsurgical changes of the left breast with several metallic densities indicating postsurgical clips. Correlate clinically for radiation therapy as the patient has history of breast cancer in the left breast in 1994 and the findings of the left upper lobe could be related to post radiation therapy changes. *Moderate coronary artery calcifications. *No obvious mediastinal masses or adenopathy although there is an enlarged main pulmonary artery and left main pulmonary artery that could correlate with pulmonary arterial hypertension. The evaluation of the mediastinum is significant limited by the lack of nodule mediastinal fat and lack of IV contrast with significantly enlarged bilateral pulmonary arteries.  PFT:     No data to display          Labs:  Path:  Echo:  Heart Catheterization:    Assessment & Plan:   COPD, moderate (HCC) - Plan: Respiratory or Resp and Sputum Culture, Tiotropium Bromide-Olodaterol (STIOLTO RESPIMAT ) 2.5-2.5 MCG/ACT AERS Assessment and Plan    Bronchiectasis with intermittent hemoptysis Chronic bronchiectasis with recent hemoptysis. CT scan shows no new infection. Possible airway irritation or inflammation. No signs of acute respiratory infection. - Monitor sputum and hemoptysis. - Use nebulizer twice daily followed by flutter valve. - Provide sputum sample cups for culture if symptoms persist. - Consider bronchoscopy if sputum sample unobtainable during business hours. - Hx of  pseudomonal infection, resistant to oral antibiotics - continue stiolto 2 puffs daily  Urinary tract infection Persistent UTI with initial treatment failure on amoxiclav. Symptoms include fatigue and headache, possibly medication-related. - Continue Macrobid . - Monitor for symptom improvement. - Reassess if symptoms persist or worsen.  Follow up in 4 months  Dorn Chill, MD [facility] Office: [phone]   Current Outpatient Medications:    albuterol  (PROVENTIL ) (2.5 MG/3ML) 0.083% nebulizer solution, Take 3 mLs (2.5 mg total) by nebulization every 6 (six) hours as needed for wheezing or shortness of breath., Disp: 75 mL, Rfl: 12   albuterol  (VENTOLIN  HFA) 108 (90 Base) MCG/ACT inhaler, TAKE 2 PUFFS BY MOUTH EVERY 6 HOURS AS NEEDED FOR WHEEZE OR SHORTNESS OF BREATH, Disp: 8.5 each, Rfl: 6   amoxicillin  (AMOXIL ) 500 MG capsule, ,  Disp: , Rfl:    ASPIRIN  81 PO, Take 1 tablet by mouth daily., Disp: , Rfl:    Calcium  Carb-Cholecalciferol  600-800 MG-UNIT TABS, Take 1 tablet by mouth daily., Disp: , Rfl:    diltiazem  (CARDIZEM  CD) 120 MG 24 hr capsule, TAKE 1 CAPSULE BY MOUTH EVERY DAY, Disp: 90 capsule, Rfl: 3   guaiFENesin  (MUCINEX ) 600 MG 12 hr tablet, Take by mouth 2 (two) times daily., Disp: , Rfl:    losartan  (COZAAR ) 25 MG tablet, TAKE 0.5 TABLETS BY MOUTH DAILY. PLEASE KEEP YOUR UPCOMING APPOINTMENT FOR FUTURE REFILLS., Disp: 45 tablet, Rfl: 0   LUMIGAN 0.01 % SOLN, , Disp: , Rfl:    Multiple Vitamins-Minerals (MULTIVITAMIN & MINERAL PO), Take 1 tablet by mouth daily., Disp: , Rfl:    nitrofurantoin , macrocrystal-monohydrate, (MACROBID ) 100 MG capsule, Take 1 capsule (100 mg total) by mouth 2 (two) times daily for 5 days., Disp: 10 capsule, Rfl: 0   nitroGLYCERIN  (NITROSTAT ) 0.4 MG SL tablet, PLACE 1 TABLET (0.4 MG TOTAL) UNDER THE TONGUE EVERY 5 (FIVE) MINUTES AS NEEDED FOR CHEST PAIN. PLEASE CALL [phone] TO MAKE APPOINTMENT WITH DR. ROSS FOR FUTURE  REFILLS., Disp: 25 tablet, Rfl: 5   Respiratory Therapy Supplies (FLUTTER) DEVI, 1 application by Does not apply route 3 (three) times daily., Disp: 1 each, Rfl: 0   rosuvastatin  (CRESTOR ) 5 MG tablet, Take 1 tablet (5 mg total) by mouth daily., Disp: 90 tablet, Rfl: 3   Saccharomyces boulardii (FLORASTOR PO), Take 1 capsule by mouth daily., Disp: , Rfl:    sodium chloride  0.9 % nebulizer solution, Take 3 mLs by nebulization 2 (two) times daily as needed (cough/congestion)., Disp: 90 mL, Rfl: 12   TURMERIC CURCUMIN PO, Take by mouth., Disp: , Rfl:    VITAMIN D  PO, Take 4,000 Int'l Units by mouth., Disp: , Rfl:    Zinc 30 MG CAPS, , Disp: , Rfl:    Tiotropium Bromide-Olodaterol (STIOLTO RESPIMAT ) 2.5-2.5 MCG/ACT AERS, Inhale 2 puffs into the lungs daily., Disp: 12 g, Rfl: 3

## 2023-10-15 NOTE — Patient Instructions (Addendum)
 Use albuterol  or saline nebulizer treatment twice daily followed by flutter valve with 5-7 good blows  Use stiolto inhaler 2 puffs daily  We will send you home with sputum sample cups in case you start to have an increase in sputum and we will check for infection  Follow up in 4 months, call sooner if needed

## 2023-10-17 NOTE — Progress Notes (Unsigned)
 Gynecologic Oncology Return Clinic Visit  Date of Service: 10/18/2023 Referring Provider: Almarie Carpen, MD   Assessment & Plan: Miranda Thompson is a 89 y.o. woman with an ORADS 2 4.7cm simple left adnexal cyst with mildly elevated CA125 who presents for follow-up.  Reviewed workup to date.  Reviewed prior MRI results which show a 4.7 cm simple left adnexal cysts, O RADS 2 which estimates a risk of malignancy of less than 1%.  Reviewed that this MRI result is very reassuring.  Repeat CT chest with ongoing concerns for chronic atypical infection.   Reviewed repeat CT a/p with stable 3.7cm benign appearing left adnexal cyst.   Discussed repeating CT scan in 1 year and follow-up after that time.  If continues to be stable and benign in appearance, will likely defer any additional imaging unless change in exam or symptoms.  Patient and daughter in agreement this plan.   RTC 54yr following CT scan.  Hoy Masters, MD Gynecologic Oncology   Medical Decision Making I personally spent  TOTAL 20 minutes face-to-face and non-face-to-face in the care of this patient, which includes all pre, intra, and post visit time on the date of service.   ----------------------- Reason for Visit: Follow-up  Treatment History: Patient presented to her OB/GYN 03/25/2023 for a pelvic ultrasound.  She had previously undergone a CT abdomen/pelvis for hip pain which noted constipation but also a 3.6 cm simple cyst of the left adnexa.  On ultrasound, her right ovary was not seen and her left ovary was noted to have a 4.1 x 3.4 cm simple avascular cyst, no free fluid.  A CA125 was collected and was mildly elevated at 37.   On review of imaging, the cyst has been there since at least 03/31/20, on MRI lumbar spine, measuring 3.2cm. A left ovarian cyst has been noted as far back at 05/07/10 on CT abdomen pelvis, noted at that time with benign features and measuring 2.5cm.  Patient does have a history of glaucoma  and follows with an eye doctor. She reports surgery on her right eye for this. She also follows with pulmonology and has follow-up this afternoon to review her CT chest results from 04/20/2023 which has changes noted.   Interval History: Today with her daughter.  Reports that she is taking an antibiotic for UTI.  Has had pelvic pain and increased urination from the UTI but these are improving with antibiotics.  Does feel a little bit of distention as well.  In terms of her lungs, she is planning to provide a sputum culture to pulmonology when she is completed her antibiotics for her UTI.  Having alternation of constipation and soft bowels.  Was having loss of appetite but this is not improved.   Past Medical/Surgical History: Past Medical History:  Diagnosis Date   Adenomatous colon polyp    Allergic rhinitis    Anemia    pt states no anemia in her past hx thst she is aware of    Atrial fibrillation (HCC)    goes in and out of this rhythm- takes Tenormin    Breast cancer, left breast (HCC) 1994 with lumpectomy   chemo,tamox,radiation   Bronchiectasis with acute exacerbation (HCC)    COPD (chronic obstructive pulmonary disease) (HCC)    Coronary artery disease    Diverticulosis of colon    DJD (degenerative joint disease)    Dysrhythmia    palpitations   Family history of brain cancer    Family history of colon cancer 02/14/2018  Family history of colon cancer    Family history of malignant neoplasm of gastrointestinal tract    Family history of melanoma    Family history of uterine cancer    Fibromyalgia    GERD (gastroesophageal reflux disease) 02/14/2018   Glaucoma    Hemoptysis    History of pneumonia    Hypercholesteremia    pt denies   Hypertension    Low back pain syndrome    Neuromuscular disorder (HCC)    hx fibromyalgia    Osteopenia    Pneumonia    hx   Pseudomonas infection    Rotator cuff syndrome of right shoulder    Spinal stenosis     Past Surgical  History:  Procedure Laterality Date   CATARACT EXTRACTION Right    COLONOSCOPY     left breast lumpectomy and LN dissection  06/1992   Dr. Ethyl   LUNG BIOPSY  1968   TSurg---Dr. Vivian   LUNG BIOPSY  2012   dr christi   POLYPECTOMY     REVERSE SHOULDER ARTHROPLASTY Right 08/02/2015   Procedure: RIGHT REVERSE TOTAL SHOULDER ARTHROPLASTY;  Surgeon: Marcey Her, MD;  Location: [facility] OR;  Service: Orthopedics;  Laterality: Right;   REVERSE TOTAL SHOULDER ARTHROPLASTY Right 08/02/2015   TONSILLECTOMY     TOTAL KNEE ARTHROPLASTY Right 01/21/2015   Procedure: TOTAL RIGHT KNEE ARTHROPLASTY;  Surgeon: Donnice Car, MD;  Location: [facility] ORS;  Service: Orthopedics;  Laterality: Right;   TOTAL KNEE ARTHROPLASTY Left 03/23/2016   Procedure: LEFT TOTAL KNEE ARTHROPLASTY;  Surgeon: Donnice Car, MD;  Location: [facility];  Service: Orthopedics;  Laterality: Left;    Family History  Problem Relation Age of Onset   Heart attack Mother        Deceased age 77   Colon cancer Father 71       deceased age 88 from colon ca.   Hypertension Brother    Stroke Maternal Grandmother    Colon polyps Daughter    Uterine cancer Daughter 49   Melanoma Daughter 25   Brain cancer Maternal Uncle    Other Paternal Uncle 12       possible cancer   Melanoma Niece 50       d. 30   Esophageal cancer Neg Hx    Stomach cancer Neg Hx    Rectal cancer Neg Hx    Ovarian cancer Neg Hx    Prostate cancer Neg Hx    Breast cancer Neg Hx     Social History   Socioeconomic History   Marital status: Married    Spouse name: Ryan   Number of children: 5   Years of education: Not on file   Highest education level: Not on file  Occupational History   Occupation: Retired  Tobacco Use   Smoking status: Former    Current packs/day: 0.00    Types: Cigarettes    Quit date: 02/24/1967    Years since quitting: 56.6   Smokeless tobacco: Never  Vaping Use   Vaping status: Never Used  Substance and Sexual Activity   Alcohol use:  Yes    Comment: occasionally   Drug use: No   Sexual activity: Not Currently    Partners: Male    Birth control/protection: Post-menopausal    Comment: husband vasectomy, older than 16, less than 5  Other Topics Concern   Not on file  Social History Narrative   Lives with husband.   Social Drivers of Corporate investment banker  Strain: Low Risk  (03/24/2023)   Overall Financial Resource Strain (CARDIA)    Difficulty of Paying Living Expenses: Not hard at all  Food Insecurity: No Food Insecurity (03/24/2023)   Hunger Vital Sign    Worried About Running Out of Food in the Last Year: Never true    Ran Out of Food in the Last Year: Never true  Transportation Needs: No Transportation Needs (03/24/2023)   PRAPARE - Administrator, Civil Service (Medical): No    Lack of Transportation (Non-Medical): No  Physical Activity: Sufficiently Active (03/24/2023)   Exercise Vital Sign    Days of Exercise per Week: 5 days    Minutes of Exercise per Session: 50 min  Stress: No Stress Concern Present (03/24/2023)   [employer] of Occupational Health - Occupational Stress Questionnaire    Feeling of Stress : Only a little  Social Connections: Moderately Isolated (03/24/2023)   Social Connection and Isolation Panel    Frequency of Communication with Friends and Family: More than three times a week    Frequency of Social Gatherings with Friends and Family: More than three times a week    Attends Religious Services: Never    Database administrator or Organizations: No    Attends Engineer, structural: Never    Marital Status: Married    Current Medications:  Current Outpatient Medications:    albuterol  (PROVENTIL ) (2.5 MG/3ML) 0.083% nebulizer solution, Take 3 mLs (2.5 mg total) by nebulization every 6 (six) hours as needed for wheezing or shortness of breath., Disp: 75 mL, Rfl: 12   albuterol  (VENTOLIN  HFA) 108 (90 Base) MCG/ACT inhaler, TAKE 2 PUFFS BY MOUTH EVERY 6 HOURS  AS NEEDED FOR WHEEZE OR SHORTNESS OF BREATH, Disp: 8.5 each, Rfl: 6   amoxicillin  (AMOXIL ) 500 MG capsule, , Disp: , Rfl:    ASPIRIN  81 PO, Take 1 tablet by mouth daily., Disp: , Rfl:    Calcium  Carb-Cholecalciferol  600-800 MG-UNIT TABS, Take 1 tablet by mouth daily., Disp: , Rfl:    diltiazem  (CARDIZEM  CD) 120 MG 24 hr capsule, TAKE 1 CAPSULE BY MOUTH EVERY DAY, Disp: 90 capsule, Rfl: 3   guaiFENesin  (MUCINEX ) 600 MG 12 hr tablet, Take by mouth 2 (two) times daily., Disp: , Rfl:    losartan  (COZAAR ) 25 MG tablet, TAKE 0.5 TABLETS BY MOUTH DAILY. PLEASE KEEP YOUR UPCOMING APPOINTMENT FOR FUTURE REFILLS., Disp: 45 tablet, Rfl: 0   LUMIGAN 0.01 % SOLN, , Disp: , Rfl:    Multiple Vitamins-Minerals (MULTIVITAMIN & MINERAL PO), Take 1 tablet by mouth daily., Disp: , Rfl:    nitrofurantoin , macrocrystal-monohydrate, (MACROBID ) 100 MG capsule, Take 1 capsule (100 mg total) by mouth 2 (two) times daily for 5 days., Disp: 10 capsule, Rfl: 0   nitroGLYCERIN  (NITROSTAT ) 0.4 MG SL tablet, PLACE 1 TABLET (0.4 MG TOTAL) UNDER THE TONGUE EVERY 5 (FIVE) MINUTES AS NEEDED FOR CHEST PAIN. PLEASE CALL [phone] TO MAKE APPOINTMENT WITH DR. ROSS FOR FUTURE REFILLS., Disp: 25 tablet, Rfl: 5   Respiratory Therapy Supplies (FLUTTER) DEVI, 1 application by Does not apply route 3 (three) times daily., Disp: 1 each, Rfl: 0   rosuvastatin  (CRESTOR ) 5 MG tablet, Take 1 tablet (5 mg total) by mouth daily., Disp: 90 tablet, Rfl: 3   Saccharomyces boulardii (FLORASTOR PO), Take 1 capsule by mouth daily., Disp: , Rfl:    sodium chloride  0.9 % nebulizer solution, Take 3 mLs by nebulization 2 (two) times daily as needed (cough/congestion)., Disp: 90 mL,  Rfl: 12   Tiotropium Bromide-Olodaterol (STIOLTO RESPIMAT ) 2.5-2.5 MCG/ACT AERS, Inhale 2 puffs into the lungs daily., Disp: 12 g, Rfl: 3   TURMERIC CURCUMIN PO, Take by mouth., Disp: , Rfl:    VITAMIN D  PO, Take 4,000 Int'l Units by mouth., Disp: , Rfl:    Zinc 30 MG CAPS, ,  Disp: , Rfl:   Review of Symptoms: Complete 10-system review is positive for: Loss of appetite, hearing loss, hoarse voice, shortness of breath, palpitations, constipation, UTI, joint pain, headache, bruising, wheezing, abdominal stanchion, pelvic pain, back pain, fatigue, chest pain, abdominal pain, dizziness, dry cough, problem walking  Physical Exam: BP (!) 150/55 (BP Location: Right Arm, Patient Position: Sitting)   Pulse 69   Temp 98.1 F (36.7 C) (Oral)   Resp 18   Wt 126 lb 3.2 oz (57.2 kg)   LMP 02/23/1985   SpO2 93%   BMI 22.36 kg/m  General: Alert, oriented, no acute distress. HEENT: Normocephalic, atraumatic. Neck symmetric without masses. Sclera anicteric.  Chest: Normal work of breathing.  Occasional crackles in bilateral lung base Cardiovascular: Regular rate and rhythm, no murmurs. Abdomen: Soft, nontender.  Normoactive bowel sounds.  No masses. Extremities: Grossly normal range of motion.  Warm, well perfused.  No edema bilaterally. Skin: No rashes or lesions noted. Lymphatics: No cervical, supraclavicular, or inguinal adenopathy. GU: Normal appearing external genitalia without erythema, excoriation, or lesions.  Speculum exam reveals atrophic vaginal mucosa and flush cervix at vaginal apex..  Bimanual exam reveals small cervix and uterus.  Cystic left adnexal mass.  Mobile.  No nodularity. Exam chaperoned by Kimberly [place], CMA     Laboratory & Radiologic Studies: Lab Results  Component Value Date   CAN125 54.0 (H) 05/04/2023    CT ABDOMEN PELVIS W CONTRAST 10/05/2023  Addendum 10/12/2023 10:29 AM ADDENDUM REPORT: 10/12/2023 10:27  ADDENDUM: In Reproductive section, last sentence should read NO other pelvic mass or free fluid identified.   Electronically Signed By: Norleen DELENA Kil M.D. On: 10/12/2023 10:27  Narrative CLINICAL DATA:  Follow-up cystic left adnexal mass.  EXAM: CT ABDOMEN AND PELVIS WITH CONTRAST  TECHNIQUE: Multidetector CT  imaging of the abdomen and pelvis was performed using the standard protocol following bolus administration of intravenous contrast.  RADIATION DOSE REDUCTION: This exam was performed according to the departmental dose-optimization program which includes automated exposure control, adjustment of the mA and/or kV according to patient size and/or use of iterative reconstruction technique.  CONTRAST:  ISOVUE -300 IOPAMIDOL  (ISOVUE -300) INJECTION 61%  COMPARISON:  10/28/2022  FINDINGS: Lower Chest: No acute findings.  Hepatobiliary: No suspicious hepatic masses identified. Gallbladder is unremarkable. No evidence of biliary ductal dilatation.  Pancreas:  No mass or inflammatory changes.  Spleen: Within normal limits in size and appearance.  Adrenals/Urinary Tract: No suspicious masses identified. No evidence of ureteral calculi or hydronephrosis. Unremarkable unopacified urinary bladder.  Stomach/Bowel: No evidence of obstruction, inflammatory process or abnormal fluid collections. Diverticulosis is seen mainly involving the sigmoid colon, however there is no evidence of diverticulitis.  Vascular/Lymphatic: No pathologically enlarged lymph nodes. No acute vascular findings.  Reproductive: Uterus is unremarkable. A simple appearing cyst again seen in the left adnexa measuring 3.7 x 3.4 cm, without significant change compared to prior study. Other pelvic mass or free fluid identified.  Other:  None.  Musculoskeletal: No suspicious bone lesions identified. Lumbar spine degenerative changes and scoliosis again noted.  IMPRESSION: Stable 3.7 cm benign-appearing left adnexal cyst. Recommend continued imaging follow-up in 6-12 months.  Colonic  diverticulosis, without radiographic evidence of diverticulitis.  Electronically Signed: By: Norleen DELENA Kil M.D. On: 10/11/2023 16:35

## 2023-10-18 ENCOUNTER — Ambulatory Visit: Payer: Self-pay | Admitting: Nurse Practitioner

## 2023-10-18 ENCOUNTER — Encounter: Payer: Self-pay | Admitting: Psychiatry

## 2023-10-18 ENCOUNTER — Inpatient Hospital Stay: Attending: Psychiatry | Admitting: Psychiatry

## 2023-10-18 VITALS — BP 136/76 | HR 69 | Temp 98.1°F | Resp 18 | Wt 126.2 lb

## 2023-10-18 DIAGNOSIS — N83202 Unspecified ovarian cyst, left side: Secondary | ICD-10-CM | POA: Insufficient documentation

## 2023-10-18 DIAGNOSIS — R971 Elevated cancer antigen 125 [CA 125]: Secondary | ICD-10-CM | POA: Insufficient documentation

## 2023-10-18 NOTE — Patient Instructions (Signed)
 It was a pleasure to see you in clinic today. - Exam and CT scan reassuring today - Return visit planned for 1 year with CT scan prior. Call us  around July 1st to get that appointment scheduled.  Thank you very much for allowing me to provide care for you today.  I appreciate your confidence in choosing our Gynecologic Oncology team at [facility].  If you have any questions about your visit today please call our office or send us  a MyChart message and we will get back to you as soon as possible.

## 2023-10-20 DIAGNOSIS — J471 Bronchiectasis with (acute) exacerbation: Secondary | ICD-10-CM | POA: Diagnosis not present

## 2023-10-20 DIAGNOSIS — J449 Chronic obstructive pulmonary disease, unspecified: Secondary | ICD-10-CM | POA: Diagnosis not present

## 2023-10-23 LAB — RESPIRATORY CULTURE OR RESPIRATORY AND SPUTUM CULTURE
MICRO NUMBER:: 16890941
SPECIMEN QUALITY:: ADEQUATE

## 2023-10-27 ENCOUNTER — Ambulatory Visit: Payer: Self-pay | Admitting: Pulmonary Disease

## 2023-10-28 ENCOUNTER — Telehealth: Payer: Self-pay

## 2023-10-28 ENCOUNTER — Encounter: Payer: Self-pay | Admitting: Obstetrics and Gynecology

## 2023-10-28 ENCOUNTER — Encounter: Payer: Self-pay | Admitting: Pulmonary Disease

## 2023-10-28 ENCOUNTER — Ambulatory Visit (INDEPENDENT_AMBULATORY_CARE_PROVIDER_SITE_OTHER): Admitting: Obstetrics and Gynecology

## 2023-10-28 ENCOUNTER — Encounter: Payer: Self-pay | Admitting: Internal Medicine

## 2023-10-28 VITALS — BP 122/62 | HR 88 | Temp 97.7°F | Wt 125.0 lb

## 2023-10-28 DIAGNOSIS — R0609 Other forms of dyspnea: Secondary | ICD-10-CM

## 2023-10-28 DIAGNOSIS — J470 Bronchiectasis with acute lower respiratory infection: Secondary | ICD-10-CM

## 2023-10-28 DIAGNOSIS — A498 Other bacterial infections of unspecified site: Secondary | ICD-10-CM

## 2023-10-28 DIAGNOSIS — R300 Dysuria: Secondary | ICD-10-CM

## 2023-10-28 NOTE — Progress Notes (Signed)
 89 y.o. [id number] female here for urinary symptoms. Married.  Patient's last menstrual period was 02/23/1985.    She reports urinary symptoms of urinary frequency and pelvic pressure. No fevers, N/V, dysuria, Symptoms have been present for 1 day. She has tried nothing. Recent E. Coli UTI on 10/11/23, treated with macrobid . Sx resolved. Urine sample provided: Yes  OB History  Gravida Para Term Preterm AB Living  5 5 5   5   SAB IAB Ectopic Multiple Live Births      5    # Outcome Date GA Lbr Len/2nd Weight Sex Type Anes PTL Lv  5 Term     M Vag-Spont   LIV  4 Term     F Vag-Spont   LIV  3 Term     M Vag-Spont   LIV  2 Term     F Vag-Spont   LIV  1 Term     F Vag-Spont   LIV   Past Medical History:  Diagnosis Date   Adenomatous colon polyp    Allergic rhinitis    Anemia    pt states no anemia in her past hx thst she is aware of    Atrial fibrillation (HCC)    goes in and out of this rhythm- takes Tenormin    Breast cancer, left breast (HCC) 1994 with lumpectomy   chemo,tamox,radiation   Bronchiectasis with acute exacerbation (HCC)    COPD (chronic obstructive pulmonary disease) (HCC)    Coronary artery disease    Diverticulosis of colon    DJD (degenerative joint disease)    Dysrhythmia    palpitations   Family history of brain cancer    Family history of colon cancer 02/14/2018   Family history of colon cancer    Family history of malignant neoplasm of gastrointestinal tract    Family history of melanoma    Family history of uterine cancer    Fibromyalgia    GERD (gastroesophageal reflux disease) 02/14/2018   Glaucoma    Hemoptysis    History of pneumonia    Hypercholesteremia    pt denies   Hypertension    Low back pain syndrome    Neuromuscular disorder (HCC)    hx fibromyalgia    Osteopenia    Pneumonia    hx   Pseudomonas infection    Rotator cuff syndrome of right shoulder    Spinal stenosis    Past Surgical History:  Procedure Laterality Date    CATARACT EXTRACTION Right    COLONOSCOPY     left breast lumpectomy and LN dissection  06/1992   Dr. Ethyl   LUNG BIOPSY  1968   TSurg---Dr. Vivian   LUNG BIOPSY  2012   dr christi   POLYPECTOMY     REVERSE SHOULDER ARTHROPLASTY Right 08/02/2015   Procedure: RIGHT REVERSE TOTAL SHOULDER ARTHROPLASTY;  Surgeon: Marcey Her, MD;  Location: [facility] OR;  Service: Orthopedics;  Laterality: Right;   REVERSE TOTAL SHOULDER ARTHROPLASTY Right 08/02/2015   TONSILLECTOMY     TOTAL KNEE ARTHROPLASTY Right 01/21/2015   Procedure: TOTAL RIGHT KNEE ARTHROPLASTY;  Surgeon: Donnice Car, MD;  Location: [facility];  Service: Orthopedics;  Laterality: Right;   TOTAL KNEE ARTHROPLASTY Left 03/23/2016   Procedure: LEFT TOTAL KNEE ARTHROPLASTY;  Surgeon: Donnice Car, MD;  Location: [facility];  Service: Orthopedics;  Laterality: Left;   Current Outpatient Medications on File Prior to Visit  Medication Sig Dispense Refill   albuterol  (PROVENTIL ) (2.5 MG/3ML) 0.083% nebulizer solution Take 3 mLs (  2.5 mg total) by nebulization every 6 (six) hours as needed for wheezing or shortness of breath. 75 mL 12   albuterol  (VENTOLIN  HFA) 108 (90 Base) MCG/ACT inhaler TAKE 2 PUFFS BY MOUTH EVERY 6 HOURS AS NEEDED FOR WHEEZE OR SHORTNESS OF BREATH 8.5 each 6   amoxicillin  (AMOXIL ) 500 MG capsule      ASPIRIN  81 PO Take 1 tablet by mouth daily.     Calcium  Carb-Cholecalciferol  600-800 MG-UNIT TABS Take 1 tablet by mouth daily.     diltiazem  (CARDIZEM  CD) 120 MG 24 hr capsule TAKE 1 CAPSULE BY MOUTH EVERY DAY 90 capsule 3   guaiFENesin  (MUCINEX ) 600 MG 12 hr tablet Take by mouth 2 (two) times daily.     losartan  (COZAAR ) 25 MG tablet TAKE 0.5 TABLETS BY MOUTH DAILY. PLEASE KEEP YOUR UPCOMING APPOINTMENT FOR FUTURE REFILLS. 45 tablet 0   LUMIGAN 0.01 % SOLN      Multiple Vitamins-Minerals (MULTIVITAMIN & MINERAL PO) Take 1 tablet by mouth daily.     nitroGLYCERIN  (NITROSTAT ) 0.4 MG SL tablet PLACE 1 TABLET (0.4 MG TOTAL) UNDER THE TONGUE  EVERY 5 (FIVE) MINUTES AS NEEDED FOR CHEST PAIN. PLEASE CALL [phone] TO MAKE APPOINTMENT WITH DR. ROSS FOR FUTURE REFILLS. 25 tablet 5   Respiratory Therapy Supplies (FLUTTER) DEVI 1 application by Does not apply route 3 (three) times daily. 1 each 0   rosuvastatin  (CRESTOR ) 5 MG tablet Take 1 tablet (5 mg total) by mouth daily. 90 tablet 3   Saccharomyces boulardii (FLORASTOR PO) Take 1 capsule by mouth daily.     sodium chloride  0.9 % nebulizer solution Take 3 mLs by nebulization 2 (two) times daily as needed (cough/congestion). 90 mL 12   Tiotropium Bromide-Olodaterol (STIOLTO RESPIMAT ) 2.5-2.5 MCG/ACT AERS Inhale 2 puffs into the lungs daily. 12 g 3   TURMERIC CURCUMIN PO Take by mouth.     VITAMIN D  PO Take 4,000 Int'l Units by mouth.     Zinc 30 MG CAPS      No current facility-administered medications on file prior to visit.   No Known Allergies    PE Today's Vitals   10/28/23 1351  BP: 122/62  Pulse: 88  Temp: 97.7 F (36.5 C)  TempSrc: Oral  SpO2: 97%  Weight: 125 lb (56.7 kg)   Body mass index is 22.14 kg/m.  Physical Exam Vitals reviewed.  Constitutional:      General: She is not in acute distress.    Appearance: Normal appearance.  HENT:     Head: Normocephalic and atraumatic.     Nose: Nose normal.  Eyes:     Extraocular Movements: Extraocular movements intact.     Conjunctiva/sclera: Conjunctivae normal.  Pulmonary:     Effort: Pulmonary effort is normal.  Musculoskeletal:        General: Normal range of motion.     Cervical back: Normal range of motion.  Neurological:     General: No focal deficit present.     Mental Status: She is alert.  Psychiatric:        Mood and Affect: Mood normal.        Behavior: Behavior normal.      Assessment and Plan:        Burning with urination -     Urinalysis,Complete w/RFL Culture  UA improved from 10/11/23 Await Ucx results Recommend increased PO   Vera LULLA Pa, MD

## 2023-10-28 NOTE — Telephone Encounter (Signed)
 I have reviewed CT     No new recommendations based on findings  Set to see her Dec/ January

## 2023-10-28 NOTE — Telephone Encounter (Signed)
 Patient states she saw dr nikki on 10-11-23 & was treated for a uti. She said the medication helped but she feels like she is getting another uti. Patient also states she has pseudomonas in urine sample & lung. Appt scheduled with Dr Dallie at 1:30. Patient aware to arrive at 1:15 for check in.

## 2023-10-28 NOTE — Telephone Encounter (Signed)
**Note De-identified  [place] Obfuscation** Please advise 

## 2023-10-28 NOTE — Telephone Encounter (Signed)
 Patient came in today & was seen for uti. Patients daughter Mliss called the triage line. She states Dr Dallie wanted to wait until the culture came back for treatment but she states her mom gets really sick with uti's & is worried about her waiting until after the weekend for treatment. She states her mom already has pseudomonas. She states the macrobid  she took last time helped her a lot & is asking if a rx can be sent in for her while waiting for the urine culture.  She uses [facility] [address] [employer].

## 2023-10-29 ENCOUNTER — Encounter: Payer: Self-pay | Admitting: Obstetrics and Gynecology

## 2023-10-29 ENCOUNTER — Encounter: Payer: Self-pay | Admitting: Internal Medicine

## 2023-10-29 ENCOUNTER — Ambulatory Visit: Payer: Self-pay

## 2023-10-29 ENCOUNTER — Telehealth: Payer: Self-pay | Admitting: Family Medicine

## 2023-10-29 DIAGNOSIS — R300 Dysuria: Secondary | ICD-10-CM

## 2023-10-29 NOTE — Telephone Encounter (Signed)
 Pt quite nervous and concerned about uti sepsis by the sept 5 message earlier today that was not forwarded to me by office staff  .  Sept 4 UA essentially neg, and culture pending.  I would favor holding antibx pending culture that should return tomorrow.  thanks

## 2023-10-29 NOTE — Telephone Encounter (Signed)
 Patients daughter Mliss called back again today & is unhappy with her mom not being started on an antibiotic until the culture result comes back. She states her mom gets sick very fast. When she came in on 10-11-23, her mom already had symptoms for 8 days. She states once her mom started experiencing symptoms this time, she told her she needed to get in to the office to see a provider fast. Patients daughter is asking if her mother can be put on macrobid  while waiting on the culture due to her moms history of uti's, getting sick fast & her being 89yrs old. Please advise.

## 2023-10-29 NOTE — Telephone Encounter (Addendum)
 MyChart message received and forwarded to Dr. Dallie.   Call placed to patients daughter, Mliss, ok per dpr. Advised per Dr. Dallie. Mliss outlined concerns noted in MyChart message. Advised message has been forwarded to Dr. Dallie. Advised Dr. Dallie is in the OR, response my not be immediate. Reviewed with Mliss if new symptoms develop can contact office, instructions provided to reach on call provider. Mliss states she has also reached out to PCP, awaiting response. ER precautions provided for new/worsening symptoms.   Mliss was appreciative of call, would appreciate update from Dr. Dallie once reviewed.   Dr. Belvin Mychart message

## 2023-10-29 NOTE — Telephone Encounter (Signed)
 Spoke with patients daughter, Miranda Thompson, advised per Dr. Dallie. Miranda Thompson states she has seen [facility]. States PCP is calling in RX.   No additional questions.   Routing to provider for final review.  Will close encounter.

## 2023-10-29 NOTE — Telephone Encounter (Signed)
 FYI Only or Action Required?: Action required by provider: clinical question for provider.  Patient was last seen in primary care on 01/14/2023 by Norleen Lynwood ORN, MD.  Called Nurse Triage reporting Urinary Frequency.  Symptoms began several days ago.  Interventions attempted: Nothing.  Symptoms are: gradually worsening.  Triage Disposition: Call PCP Within 24 Hours  Patient/caregiver understands and will follow disposition?: Yes Reason for Disposition  [1] Painful urination AND [2] EITHER frequency or urgency AND [3] has on-call doctor  Answer Assessment - Initial Assessment Questions Patient's daughter Mliss calling in regards to urinary frequency, bloating, burning, loss of appetite. Urine culture done at GYN yesterday, stated they will not prescribe antibiotic until culture comes back. Mliss is extremely concerned with how quickly patient gets sick that she cannot go the weekend without treatment. Requesting PCP send over something and is requesting a call back [phone]   1. ONSET: When did the painful urination start?      Few days ago  2. FEVER: Do you have a fever? If Yes, ask: What is your temperature, how was it measured, and when did it start?     No  3. PAST UTI: Have you had a urine infection before? If Yes, ask: When was the last time? and What happened that time?      Yes, given antibiotic  4. CAUSE: What do you think is causing the painful urination?  (e.g., UTI, scratch, Herpes sore)     UTI  Protocols used: Urination Pain - [facility]  Copied from CRM #[id number]. Topic: Clinical - Red Word Triage >> Oct 29, 2023  1:18 PM Gennette ORN wrote: Red Word that prompted transfer to Nurse Triage: Patient's daughter Mliss is calling because patient is in severe pain she is having a UTI and Mliss wants to know what should she do?

## 2023-10-29 NOTE — Telephone Encounter (Signed)
 Copied from CRM [id number]. Topic: Clinical - Medical Advice >> Oct 28, 2023  2:25 PM Isabell A wrote: Reason for CRM: Mliss Modest - daughter states after visit with Dr.Dewald he read the CT report and Sputum results & if they showed worsening further IV treatment would be necessary.    Mliss would like to confirm why she doesnt need to do the IV antibiotic treatment.    Callback number: [phone]  Dr. Kara responded through MyChart.

## 2023-10-29 NOTE — Telephone Encounter (Signed)
 After hrs nurse contacted me stating daughter is at pharmacy waiting for an antibiotic as Dr. Norleen reportedly told her he would send one in. Upon review of the chart, Dr. Norleen made no documented mention of this and did not even evaluate the pt. She was evaluated by GYN on 9/4 who noted her results will be monitored over the weekend and abx sent if necessary. Rec'd following up with that team if anything changes. Will route to Dr. Norleen should he wish to alter the plan.

## 2023-10-30 ENCOUNTER — Ambulatory Visit: Payer: Self-pay | Admitting: Obstetrics and Gynecology

## 2023-10-30 ENCOUNTER — Encounter: Payer: Self-pay | Admitting: Obstetrics and Gynecology

## 2023-10-30 LAB — URINALYSIS, COMPLETE W/RFL CULTURE
Bilirubin Urine: NEGATIVE
Glucose, UA: NEGATIVE
Hyaline Cast: NONE SEEN /LPF
Ketones, ur: NEGATIVE
Nitrites, Initial: NEGATIVE
Protein, ur: NEGATIVE
Specific Gravity, Urine: 1.015 (ref 1.001–1.035)
pH: 7 (ref 5.0–8.0)

## 2023-10-30 LAB — URINE CULTURE
MICRO NUMBER:: 16922873
Result:: NO GROWTH
SPECIMEN QUALITY:: ADEQUATE

## 2023-10-30 LAB — CULTURE INDICATED

## 2023-11-01 ENCOUNTER — Other Ambulatory Visit: Payer: Self-pay | Admitting: Internal Medicine

## 2023-11-01 NOTE — Telephone Encounter (Signed)
 Not sure when scheduled  Can resutls be tagged to me as well to follow up on ?

## 2023-11-02 ENCOUNTER — Other Ambulatory Visit (INDEPENDENT_AMBULATORY_CARE_PROVIDER_SITE_OTHER)

## 2023-11-02 ENCOUNTER — Encounter: Payer: Self-pay | Admitting: Internal Medicine

## 2023-11-02 ENCOUNTER — Ambulatory Visit: Payer: Self-pay | Admitting: Internal Medicine

## 2023-11-02 ENCOUNTER — Ambulatory Visit (INDEPENDENT_AMBULATORY_CARE_PROVIDER_SITE_OTHER): Admitting: Internal Medicine

## 2023-11-02 ENCOUNTER — Ambulatory Visit: Payer: Self-pay

## 2023-11-02 ENCOUNTER — Telehealth: Payer: Self-pay | Admitting: Internal Medicine

## 2023-11-02 VITALS — BP 173/78 | HR 89 | Temp 97.9°F | Ht 63.0 in | Wt 125.4 lb

## 2023-11-02 DIAGNOSIS — Z79899 Other long term (current) drug therapy: Secondary | ICD-10-CM

## 2023-11-02 DIAGNOSIS — J479 Bronchiectasis, uncomplicated: Secondary | ICD-10-CM | POA: Diagnosis not present

## 2023-11-02 DIAGNOSIS — R918 Other nonspecific abnormal finding of lung field: Secondary | ICD-10-CM

## 2023-11-02 DIAGNOSIS — J449 Chronic obstructive pulmonary disease, unspecified: Secondary | ICD-10-CM

## 2023-11-02 DIAGNOSIS — Z87891 Personal history of nicotine dependence: Secondary | ICD-10-CM | POA: Diagnosis not present

## 2023-11-02 DIAGNOSIS — J471 Bronchiectasis with (acute) exacerbation: Secondary | ICD-10-CM

## 2023-11-02 DIAGNOSIS — R300 Dysuria: Secondary | ICD-10-CM | POA: Diagnosis not present

## 2023-11-02 DIAGNOSIS — J151 Pneumonia due to Pseudomonas: Secondary | ICD-10-CM

## 2023-11-02 DIAGNOSIS — B965 Pseudomonas (aeruginosa) (mallei) (pseudomallei) as the cause of diseases classified elsewhere: Secondary | ICD-10-CM | POA: Diagnosis not present

## 2023-11-02 DIAGNOSIS — I10 Essential (primary) hypertension: Secondary | ICD-10-CM

## 2023-11-02 LAB — URINALYSIS, ROUTINE W REFLEX MICROSCOPIC
Bilirubin Urine: NEGATIVE
Hgb urine dipstick: NEGATIVE
Ketones, ur: NEGATIVE
Leukocytes,Ua: NEGATIVE
Nitrite: NEGATIVE
Specific Gravity, Urine: 1.01 (ref 1.000–1.030)
Total Protein, Urine: NEGATIVE
Urine Glucose: NEGATIVE
Urobilinogen, UA: 0.2 (ref 0.0–1.0)
pH: 7.5 (ref 5.0–8.0)

## 2023-11-02 MED ORDER — LOSARTAN POTASSIUM 50 MG PO TABS: 50.0000 mg | ORAL_TABLET | Freq: Every day | ORAL | 3 refills | Status: AC

## 2023-11-02 MED ORDER — ALBUTEROL SULFATE (2.5 MG/3ML) 0.083% IN NEBU: 2.5000 mg | INHALATION_SOLUTION | Freq: Four times a day (QID) | RESPIRATORY_TRACT | 12 refills | Status: DC | PRN

## 2023-11-02 MED ORDER — SODIUM CHLORIDE 0.9 % IN NEBU
3.0000 mL | INHALATION_SOLUTION | Freq: Two times a day (BID) | RESPIRATORY_TRACT | 12 refills | Status: AC | PRN
Start: 2023-11-02 — End: ?

## 2023-11-02 MED ORDER — ALBUTEROL SULFATE (2.5 MG/3ML) 0.083% IN NEBU: 2.5000 mg | INHALATION_SOLUTION | Freq: Four times a day (QID) | RESPIRATORY_TRACT | 12 refills | Status: AC | PRN

## 2023-11-02 NOTE — Telephone Encounter (Signed)
 This is something that we do frequently, the order either needs to go to [facility] or [facility].  The order has already been put in for the PICC line and the antibiotic.  Can you find someone to assist you with this please?

## 2023-11-02 NOTE — Telephone Encounter (Signed)
 FYI Only or Action Required?: Action required by provider: request for appointment.  Patient was last seen in primary care on 01/14/2023 by Miranda Lynwood ORN, Miranda Thompson.  Called Nurse Triage reporting Chest Pain.  Symptoms began yesterday.  Interventions attempted: Rest, hydration, or home remedies.  Symptoms are: unchanged. During postural drainage, husband noted green-yellow bile drainage. Warm transfer to [facility] in the practice.  Triage Disposition: See Physician Within 24 Hours  Patient/caregiver understands and will follow disposition?: Yes   Copied from CRM #[id number]. Topic: Clinical - Red Word Triage >> Nov 02, 2023  8:46 AM Miranda Thompson wrote: Red Word that prompted transfer to Nurse Triage: More tightness in chest, and now has bile in her sputum. Reason for Disposition  [1] Chest pain lasts > 5 minutes AND [2] occurred > 3 days ago (72 hours) AND [3] NO chest pain or cardiac symptoms now  Answer Assessment - Initial Assessment Questions 1. LOCATION: Where does it hurt?       central 2. RADIATION: Does the pain go anywhere else? (e.g., into neck, jaw, arms, back)     no 3. ONSET: When did the chest pain begin? (Minutes, hours or days)      3 years 4. PATTERN: Does the pain come and go, or has it been constant since it started?  Does it get worse with exertion?      constant 5. DURATION: How long does it last (e.g., seconds, minutes, hours)     always 6. SEVERITY: How bad is the pain?  (e.g., Scale 1-10; mild, moderate, or severe)     severe 7. CARDIAC RISK FACTORS: Do you have any history of heart problems or risk factors for heart disease? (e.g., angina, prior heart attack; diabetes, high blood pressure, high cholesterol, smoker, or strong family history of heart disease)     yes 8. PULMONARY RISK FACTORS: Do you have any history of lung disease?  (e.g., blood clots in lung, asthma, emphysema, birth control pills)     yes 9. CAUSE: What do you think is causing  the chest pain?     infection 10. OTHER SYMPTOMS: Do you have any other symptoms? (e.g., dizziness, nausea, vomiting, sweating, fever, difficulty breathing, cough)       SOB 11. PREGNANCY: Is there any chance you are pregnant? When was your last menstrual period?       NO  Protocols used: Chest Pain-A-AH

## 2023-11-02 NOTE — Telephone Encounter (Signed)
 Pt c/o medication issue:  1. Name of Medication:  losartan  (COZAAR ) 25 MG tablet  2. How are you currently taking this medication (dosage and times per day)?   3. Are you having a reaction (difficulty breathing--STAT)?   4. What is your medication issue?   Patient says she has been taking a whole tablet instead of half by accident. She is now completely out of medication and will need a new prescription but would like to know how she needs to take it. Please advise.

## 2023-11-02 NOTE — Telephone Encounter (Signed)
 It has been sent. NFN

## 2023-11-02 NOTE — Telephone Encounter (Signed)
 Pt is scheduled for acute ov with ND 11/02/23

## 2023-11-02 NOTE — Telephone Encounter (Signed)
 Hey there we cannot send this order to a DME company. Please advise. Thanks

## 2023-11-02 NOTE — Patient Instructions (Signed)
 It was a pleasure to see you today!  Please schedule follow up with myself in 3 months.  If my schedule is not open yet, we will contact you with a reminder closer to that time. Please call [phone] if you haven't heard from us  a month before, and always call us  sooner if issues or concerns arise. You can also send us  a message through MyChart, but but aware that this is not to be used for urgent issues and it may take up to 5-7 days to receive a reply. Please be aware that you will likely be able to view your results before I have a chance to respond to them. Please give us  5 business days to respond to any non-urgent results.    YOUR PLAN:  -BRONCHIECTASIS WITH ACUTE EXACERBATION AND CHRONIC PSEUDOMONAS AERUGINOSA COLONIZATION: Bronchiectasis is a condition where the airways in your lungs become damaged and widened, leading to mucus build-up and infections. Pseudomonas aeruginosa is a type of bacteria that can colonize these airways. Your symptoms suggest a flare-up of this condition. We will start you on IV antibiotics at home, and a PICC line will be placed for long-term access. You will also begin hypertonic saline nebulization and use a vest for airway clearance. Follow the airway clearance regimen which includes albuterol , hypertonic saline nebulization, the vest, and postural drainage. You will also see an infectious disease specialist for further management and consideration of suppressive nebulizer therapy.  INSTRUCTIONS:  1. Complete the blood work to check your immunoglobulins. 2. Home Health will arrange for your IV antibiotics. 3. You will be referred to interventional radiology for PICC line placement. 4. Follow the airway clearance regimen as instructed: albuterol , hypertonic saline nebulization, vest, and postural drainage. 5. Attend your appointment with the infectious disease specialist for further management.

## 2023-11-02 NOTE — Telephone Encounter (Signed)
 Spoke with pt regarding her medication. Pt stated she had been taking a whole tablet of losartan  daily (25 mg) rather than a half tablet (12.5 mg). Pt's blood pressures have been slightly elevated even with taking a whole 25 mg daily. Pt's most recent blood pressure readings are as follows: 142/68 156/76 143/71 148/69 136/74 Pt was told this information would be sent to Dr. Okey for review. Pt verbalized understanding. All questions if any were answered.

## 2023-11-02 NOTE — Telephone Encounter (Signed)
 Increase to 50 mg daily Patient needs BMET

## 2023-11-02 NOTE — Progress Notes (Signed)
 Miranda Thompson    [id number]    [birth year]  Primary Care Physician:John, Lynwood ORN, MD Date of Appointment: 11/02/2023 Established Patient Visit  Chief complaint:   Chief Complaint  Patient presents with   Medical Management of Chronic Issues    Pseudomonas- brought up bio in lungs tasted it in mouth and it ws green yellow      HPI: Miranda Thompson is a 89 y.o. woman with history of bronchiectasis managed with postural drainage and longterm pseudomonas colonization.   Interval Updates:  Here for acute visit.  Discussed the use of AI scribe software for clinical note transcription with the patient, who gave verbal consent to proceed.  History of Present Illness [name] is an 89 year old female with chronic bronchiectasis and pseudomonas colonization who presents with worsening respiratory symptoms and bile-like sputum production. She is accompanied by her daughter, Recardo, and another family member, Leita. She was referred by Dr. Kara for evaluation of her chronic lung condition and pseudomonas colonization.  She has a long-standing history of bronchiectasis, diagnosed around [phone], and has been colonized with pseudomonas for approximately thirty years. She manages her condition with postural drainage and exercise, which has been effective in the past. Recently, she experienced an unusual episode where she produced sputum with a bile-like appearance and taste during her routine postural drainage while lying on her left side. This episode was concerning enough to prompt an earlier medical evaluation before her scheduled infectious disease appointment on September 23rd.  No fever or chills, but she experiences chest tightness. No nausea or vomiting, and the bile-like sputum was not collected for analysis. She has a history of a urinary tract infection two to three weeks ago, which also cultured pseudomonas.  She did not have recurrent bronchitis or pneumonia as a  child. She has a history of breast cancer in 1994, for which she received chest wall radiation and chemotherapy. There is a family history of tuberculosis in her mother, who was a Engineer, civil (consulting).  She currently uses Stiolto and albuterol  for her respiratory condition. She has not been on antibiotic nebulizer therapy but uses albuterol  and saline nebulizers. She lives independently at home.    I have reviewed the patient's family social and past medical history and updated as appropriate.   Past Medical History:  Diagnosis Date   Adenomatous colon polyp    Allergic rhinitis    Anemia    pt states no anemia in her past hx thst she is aware of    Atrial fibrillation (HCC)    goes in and out of this rhythm- takes Tenormin    Breast cancer, left breast (HCC) 1994 with lumpectomy   chemo,tamox,radiation   Bronchiectasis with acute exacerbation (HCC)    COPD (chronic obstructive pulmonary disease) (HCC)    Coronary artery disease    Diverticulosis of colon    DJD (degenerative joint disease)    Dysrhythmia    palpitations   Family history of brain cancer    Family history of colon cancer 02/14/2018   Family history of colon cancer    Family history of malignant neoplasm of gastrointestinal tract    Family history of melanoma    Family history of uterine cancer    Fibromyalgia    GERD (gastroesophageal reflux disease) 02/14/2018   Glaucoma    Hemoptysis    History of pneumonia    Hypercholesteremia    pt denies   Hypertension  Low back pain syndrome    Neuromuscular disorder (HCC)    hx fibromyalgia    Osteopenia    Pneumonia    hx   Pseudomonas infection    Rotator cuff syndrome of right shoulder    Spinal stenosis     Past Surgical History:  Procedure Laterality Date   CATARACT EXTRACTION Right    COLONOSCOPY     left breast lumpectomy and LN dissection  06/1992   Dr. Ethyl   LUNG BIOPSY  1968   TSurg---Dr. Vivian   LUNG BIOPSY  2012   dr christi   POLYPECTOMY      REVERSE SHOULDER ARTHROPLASTY Right 08/02/2015   Procedure: RIGHT REVERSE TOTAL SHOULDER ARTHROPLASTY;  Surgeon: Marcey Her, MD;  Location: [facility] OR;  Service: Orthopedics;  Laterality: Right;   REVERSE TOTAL SHOULDER ARTHROPLASTY Right 08/02/2015   TONSILLECTOMY     TOTAL KNEE ARTHROPLASTY Right 01/21/2015   Procedure: TOTAL RIGHT KNEE ARTHROPLASTY;  Surgeon: Donnice Car, MD;  Location: [facility] ORS;  Service: Orthopedics;  Laterality: Right;   TOTAL KNEE ARTHROPLASTY Left 03/23/2016   Procedure: LEFT TOTAL KNEE ARTHROPLASTY;  Surgeon: Donnice Car, MD;  Location: [facility] ORS;  Service: Orthopedics;  Laterality: Left;    Family History  Problem Relation Age of Onset   Heart attack Mother        Deceased age 32   Colon cancer Father 70       deceased age 73 from colon ca.   Hypertension Brother    Stroke Maternal Grandmother    Colon polyps Daughter    Uterine cancer Daughter 40   Melanoma Daughter 46   Brain cancer Maternal Uncle    Other Paternal Uncle 12       possible cancer   Melanoma Niece 58       d. 58   Esophageal cancer Neg Hx    Stomach cancer Neg Hx    Rectal cancer Neg Hx    Ovarian cancer Neg Hx    Prostate cancer Neg Hx    Breast cancer Neg Hx     Social History   Occupational History   Occupation: Retired  Tobacco Use   Smoking status: Former    Current packs/day: 0.00    Types: Cigarettes    Quit date: 02/24/1967    Years since quitting: 56.7   Smokeless tobacco: Never  Vaping Use   Vaping status: Never Used  Substance and Sexual Activity   Alcohol use: Yes    Comment: occasionally   Drug use: No   Sexual activity: Not Currently    Partners: Male    Birth control/protection: Post-menopausal    Comment: husband vasectomy, older than 16, less than 5     Physical Exam: Blood pressure (!) 173/78, pulse 89, temperature 97.9 F (36.6 C), temperature source Temporal, height 5' 3 (1.6 m), weight 125 lb 6.4 oz (56.9 kg), last menstrual period 02/23/1985, SpO2  96%.  Gen:      No acute distress, frail, thin eldery woman Lungs:    No increased respiratory effort, symmetric chest wall excursion, clear to auscultation bilaterally, mild right basilar crackles CV:         Regular rate and rhythm; no murmurs, rubs, or gallops.  No pedal edema   Data Reviewed: Imaging: I have personally reviewed the CT chest shows bronchiectasis RML, lingula, mucoid impaction.   PFTs:   Labs: Lab Results  Component Value Date   WBC 6.7 10/22/2022   HGB 12.5 10/22/2022  HCT 37.2 10/22/2022   MCV 98.1 10/22/2022   PLT 272.0 10/22/2022   Lab Results  Component Value Date   WBC 6.7 10/22/2022   HGB 12.5 10/22/2022   HCT 37.2 10/22/2022   MCV 98.1 10/22/2022   PLT 272.0 10/22/2022   Sputum culture positive for pseudomonas resistant to fluoroquinalones Immunization status: Immunization History  Administered Date(s) Administered   Fluad Quad(high Dose 65+) 11/08/2019, 11/14/2020, 11/19/2021   INFLUENZA, HIGH DOSE SEASONAL PF 11/19/2015, 12/02/2016, 12/17/2017, 11/22/2018   Influenza Split 12/03/2011   Influenza Whole 11/30/2007, 11/30/2008, 12/03/2009, 11/24/2010   Influenza,inj,Quad PF,6+ Mos 11/10/2012, 11/08/2013, 11/06/2014   Influenza-Unspecified 12/02/2016, 11/22/2018, 11/14/2021, 12/15/2022   PFIZER Comirnaty(Gray Top)Covid-19 Tri-Sucrose Vaccine 05/27/2020   PFIZER(Purple Top)SARS-COV-2 Vaccination 03/14/2019, 04/04/2019, 11/18/2019   Pfizer(Comirnaty)Fall Seasonal Vaccine 12 years and older 11/14/2021   Pneumococcal Conjugate-13 05/09/2014   Pneumococcal Polysaccharide-23 11/07/2007   Tdap 05/09/2014   Unspecified SARS-COV-2 Vaccination 12/15/2022   Zoster Recombinant(Shingrix) 04/10/2018, 01/08/2019   Zoster, Live 12/11/2009    External Records Personally Reviewed: pulmonary, pcp  Assessment and Plan Assessment & Plan Bronchiectasis with acute exacerbation and chronic Pseudomonas aeruginosa colonization Chronic bronchiectasis with acute  exacerbation and Pseudomonas aeruginosa colonization. Symptoms suggestive of flare. Discussed IV antibiotics and PICC line for long-term access. Considered nebulized antibiotic therapy. - Order blood work to check immunoglobulins. - Arrange for IV antibiotics at home through [facility]. - Refer to interventional radiology for PICC line placement. - Prescribe hypertonic saline nebulization. - Order a vest for airway clearance. - Provide instructions for airway clearance regimen: albuterol , hypertonic saline neb, vest, and postural drainage. - Refer to infectious disease specialist for further management and consideration of suppressive nebulizer therapy such as tobramycin nebs    I spent 40 minutes on 11/02/2023 in care of this patient including face to face time and non-face to face time spent charting, review of outside records, and coordination of care.    Return to Care: Return in about 3 months (around 02/01/2024).   Verdon Gore, MD Pulmonary and Critical Care Medicine [facility] Office:[phone]

## 2023-11-03 LAB — URINE CULTURE: Result:: NO GROWTH

## 2023-11-04 ENCOUNTER — Other Ambulatory Visit (HOSPITAL_BASED_OUTPATIENT_CLINIC_OR_DEPARTMENT_OTHER): Payer: Self-pay | Admitting: *Deleted

## 2023-11-04 DIAGNOSIS — I10 Essential (primary) hypertension: Secondary | ICD-10-CM

## 2023-11-04 DIAGNOSIS — Z79899 Other long term (current) drug therapy: Secondary | ICD-10-CM

## 2023-11-04 NOTE — Telephone Encounter (Signed)
 Issue has since been resolved in MyChart message. Patient had repeat UA/Culture which showed clear

## 2023-11-05 LAB — BASIC METABOLIC PANEL WITH GFR
BUN/Creatinine Ratio: 20 (ref 12–28)
BUN: 13 mg/dL (ref 8–27)
CO2: 23 mmol/L (ref 20–29)
Calcium: 9 mg/dL (ref 8.7–10.3)
Chloride: 93 mmol/L — ABNORMAL LOW (ref 96–106)
Creatinine, Ser: 0.65 mg/dL (ref 0.57–1.00)
Glucose: 104 mg/dL — ABNORMAL HIGH (ref 70–99)
Potassium: 4.6 mmol/L (ref 3.5–5.2)
Sodium: 131 mmol/L — ABNORMAL LOW (ref 134–144)
eGFR: 85 mL/min/1.73 (ref >59)

## 2023-11-05 LAB — IMMUNOGLOBULINS A/E/G/M, SERUM
IgA/Immunoglobulin A, Serum: 308 mg/dL (ref 64–422)
IgE (Immunoglobulin E), Serum: 65 [IU]/mL (ref 6–495)
IgG (Immunoglobin G), Serum: 1533 mg/dL (ref 586–1602)
IgM (Immunoglobulin M), Srm: 77 mg/dL (ref 26–217)

## 2023-11-06 ENCOUNTER — Telehealth: Payer: Self-pay | Admitting: Obstetrics and Gynecology

## 2023-11-06 NOTE — Telephone Encounter (Signed)
 Returned call from answering service. Called with recent UTI. Also notes having back pain for 1 week with acute worsening today that feels like charlie horse in back. Has difficulty with walking and discomfort with sitting as well. Recommend in person evaluation given acute worsening

## 2023-11-07 ENCOUNTER — Encounter: Payer: Self-pay | Admitting: Internal Medicine

## 2023-11-08 ENCOUNTER — Ambulatory Visit: Payer: Self-pay | Admitting: Internal Medicine

## 2023-11-08 ENCOUNTER — Ambulatory Visit: Payer: Self-pay

## 2023-11-08 NOTE — Telephone Encounter (Signed)
 FYI Only or Action Required?: FYI only for provider.  Patient was last seen in primary care on 01/14/2023 by Norleen Lynwood ORN, MD.  Called Nurse Triage reporting Back Pain.  Symptoms began Sat am .  Interventions attempted: Ice/heat application.  Symptoms are: unchanged.  Triage Disposition: See PCP When Office is Open (Within 3 Days)  Patient/caregiver understands and will follow disposition?: yes        Copied from CRM #[id number]. Topic: Clinical - Red Word Triage >> Nov 08, 2023  9:54 AM Charlet HERO wrote: Red Word that prompted transfer to Nurse Triage: Patient is calling about back pain in the middle down to her right side and her fingers are cramping. Reason for Disposition  [1] MODERATE back pain (e.g., interferes with normal activities) AND [2] present > 3 days  Answer Assessment - Initial Assessment Questions 1. ONSET: When did the pain begin? (e.g., minutes, hours, days)     Sat am  2. LOCATION: Where does it hurt? (upper, mid or lower back)     Middle and to the right of the back  3. SEVERITY: How bad is the pain?  (e.g., Scale 1-10; mild, moderate, or severe)     7/10 4. PATTERN: Is the pain constant? (e.g., yes, no; constant, intermittent)      Constant  5. RADIATION: Does the pain shoot into your legs or somewhere else?     Shoots down left side of right leg , pressure in the pelvic area 6. CAUSE:  What do you think is causing the back pain?      Unsure  7. BACK OVERUSE:  Any recent lifting of heavy objects, strenuous work or exercise?     N/a 8. MEDICINES: What have you taken so far for the pain? (e.g., nothing, acetaminophen , NSAIDS)     N/a 9. NEUROLOGIC SYMPTOMS: Do you have any weakness, numbness, or problems with bowel/bladder control?     N/a 10. OTHER SYMPTOMS: Do you have any other symptoms? (e.g., fever, abdomen pain, burning with urination, blood in urine)       Pelvic pressure, fingers are cramping  11. PREGNANCY: Is there any  chance you are pregnant? When was your last menstrual period?       N/a  Protocols used: Back Pain-A-AH

## 2023-11-09 ENCOUNTER — Ambulatory Visit: Payer: Self-pay | Admitting: Internal Medicine

## 2023-11-09 ENCOUNTER — Ambulatory Visit (INDEPENDENT_AMBULATORY_CARE_PROVIDER_SITE_OTHER)

## 2023-11-09 ENCOUNTER — Encounter: Payer: Self-pay | Admitting: Internal Medicine

## 2023-11-09 ENCOUNTER — Ambulatory Visit (INDEPENDENT_AMBULATORY_CARE_PROVIDER_SITE_OTHER): Admitting: Internal Medicine

## 2023-11-09 VITALS — BP 140/64 | HR 79 | Temp 97.8°F | Ht 63.0 in | Wt 125.2 lb

## 2023-11-09 DIAGNOSIS — Z0001 Encounter for general adult medical examination with abnormal findings: Secondary | ICD-10-CM

## 2023-11-09 DIAGNOSIS — I10 Essential (primary) hypertension: Secondary | ICD-10-CM | POA: Diagnosis not present

## 2023-11-09 DIAGNOSIS — E785 Hyperlipidemia, unspecified: Secondary | ICD-10-CM | POA: Diagnosis not present

## 2023-11-09 DIAGNOSIS — M5441 Lumbago with sciatica, right side: Secondary | ICD-10-CM

## 2023-11-09 DIAGNOSIS — R739 Hyperglycemia, unspecified: Secondary | ICD-10-CM

## 2023-11-09 DIAGNOSIS — M438X6 Other specified deforming dorsopathies, lumbar region: Secondary | ICD-10-CM | POA: Diagnosis not present

## 2023-11-09 DIAGNOSIS — M48061 Spinal stenosis, lumbar region without neurogenic claudication: Secondary | ICD-10-CM | POA: Diagnosis not present

## 2023-11-09 DIAGNOSIS — J479 Bronchiectasis, uncomplicated: Secondary | ICD-10-CM

## 2023-11-09 DIAGNOSIS — M545 Low back pain, unspecified: Secondary | ICD-10-CM | POA: Diagnosis not present

## 2023-11-09 DIAGNOSIS — M5116 Intervertebral disc disorders with radiculopathy, lumbar region: Secondary | ICD-10-CM | POA: Diagnosis not present

## 2023-11-09 MED ORDER — HYDROCODONE-ACETAMINOPHEN 5-325 MG PO TABS: 1.0000 | ORAL_TABLET | Freq: Four times a day (QID) | ORAL | 0 refills | Status: AC | PRN

## 2023-11-09 MED ORDER — CYCLOBENZAPRINE HCL 5 MG PO TABS: 5.0000 mg | ORAL_TABLET | Freq: Three times a day (TID) | ORAL | 1 refills | Status: AC | PRN

## 2023-11-09 NOTE — Patient Instructions (Addendum)
 You had the flu shot today  Please take all new medication as prescribed - the hydrocodone  as needed for pain (even a half pill may be enough), and the muscle relaxer  Please continue all other medications as before, and refills have been done if requested.  Please have the pharmacy call with any other refills you may need.  Please continue your efforts at being more active, low cholesterol diet, and weight control.  You are otherwise up to date with prevention measures today.  Please keep your appointments with your specialists as you may have planned  Please go to the XRAY Department in the first floor for the x-ray testing  We can hold on lab testing today  Please make an Appointment to return in 6 months, or sooner if needed

## 2023-11-09 NOTE — Assessment & Plan Note (Signed)
 Severe to start now moderate, for hydrocodon 5 325  - 1/2 - 1 qid prn, flexeril  5 tid prn, declines need for prednisone  or gabapentin, and LS spine films today r/o compression fx.  Consider MRI LS spine if abnormal or not improving

## 2023-11-09 NOTE — Assessment & Plan Note (Signed)
 Lab Results  Component Value Date   LDLCALC 87 03/24/2022   uncontrolled, pt to continue current statin crestor  5 mg ,decline increase for now

## 2023-11-09 NOTE — Assessment & Plan Note (Signed)
 For PICC line and antibx course to start soon

## 2023-11-09 NOTE — Telephone Encounter (Signed)
 FYI Only or Action Required?: Action required by provider: Requesting call back asap regarding what to expect with PICC line and the potential risks/benefits of the specific antibx Dr. Meade is planning for pt..  Patient is followed in Pulmonology for COPD and lung nodules, last seen on 11/02/2023 by Meade Verdon RAMAN, MD.  Called Nurse Triage reporting clinical question and Anxiety.  Pt denies new or worsening symptoms.  Triage Disposition: Call PCP When Office is Open  Patient/caregiver understands and will follow disposition?: Yes     Copied from CRM [id number]. Topic: Clinical - Medical Advice >> Nov 09, 2023  1:00 PM Nathanel DEL wrote: Reason for CRM: pt is having PIC line and she wants to know what type of abx Dr Meade will use on her   Reason for Disposition  [1] Caller requesting NON-URGENT health information AND [2] PCP's office is the best resource  Answer Assessment - Initial Assessment Questions Advised that nurse sending message to pulm for call back asap regarding what to expect with PICC line and the potential risks/benefits of the specific antibx Dr. Meade is planning for pt.    1. REASON FOR CALL: What is the main reason for your call? or How can I best help you?     Supposed to get PICC line put in with antibx this week, pt asking Are these antibx that may cause me serious complications later on with my age? Asking what to expect with PICC line and the specific antibx 2. SYMPTOMS : Do you have any symptoms?      Issue with back right now, able to lay flat though No other symptoms and did have x-rays this morning so that should be available hopefully by tomorrow 3. OTHER QUESTIONS: Do you have any other questions?     No further questions or concerns at this time  Protocols used: Information Only Call - No Triage-A-AH

## 2023-11-09 NOTE — Assessment & Plan Note (Signed)
Age and sex appropriate education and counseling updated with regular exercise and diet Referrals for preventative services - none needed Immunizations addressed - for flu shot today Smoking counseling  - none needed Evidence for depression or other mood disorder - none significant Most recent labs reviewed. I have personally reviewed and have noted: 1) the patient's medical and social history 2) The patient's current medications and supplements 3) The patient's height, weight, and BMI have been recorded in the chart  

## 2023-11-09 NOTE — Telephone Encounter (Signed)
 Ok this is noted, and already addressed today at OV

## 2023-11-09 NOTE — Assessment & Plan Note (Signed)
 BP Readings from Last 3 Encounters:  11/09/23 (!) 140/64  11/02/23 (!) 173/78  10/28/23 122/62   Uncontrolled, likely reactive, pt to continue medical treatment card CD 120 every day, losartan  50 mg every day, decline change today

## 2023-11-09 NOTE — Assessment & Plan Note (Signed)
 Lab Results  Component Value Date   HGBA1C 5.9 (H) 12/15/2022   Stable, pt to continue current medical treatment  - diet, wt control

## 2023-11-09 NOTE — Progress Notes (Signed)
 Patient ID: Miranda Thompson, female   DOB: 10-Jan-1934, 89 y.o.   MRN: [id number]         Chief Complaint:: wellness exam and Back Pain (Worsening middle back pain, hand cramping for several weeks. More severe since Saturday. Treating with lidocaine  patches and tylenol )  , bronchiectasis, hyperglycemia,        HPI:  Miranda Thompson is a 89 y.o. female here for wellness exam; for flu shot today, o/w up to date                        Also for PICC line and antibx tx for pseudomonas bronchiectasis soon.  Maiin c/o is of 3 days severe midline lbp with radiation to right paraspinal and also mild to mod recurrent RLE sciatica.  No weakness or falls, and pain better today overall than the start.  No hx of compression fractures.  Pt denies chest pain, increased sob or doe, wheezing, orthopnea, PND, increased LE swelling, palpitations, dizziness or syncope.   Pt denies polydipsia, polyuria, or new focal neuro s/s.   Pt denies recent wt loss, night sweats, loss of appetite, or other constitutional symptoms     Wt Readings from Last 3 Encounters:  11/09/23 125 lb 3.2 oz (56.8 kg)  11/02/23 125 lb 6.4 oz (56.9 kg)  10/28/23 125 lb (56.7 kg)   BP Readings from Last 3 Encounters:  11/09/23 (!) 140/64  11/02/23 (!) 173/78  10/28/23 122/62   Immunization History  Administered Date(s) Administered   Fluad Quad(high Dose 65+) 11/08/2019, 11/14/2020, 11/19/2021   INFLUENZA, HIGH DOSE SEASONAL PF 11/19/2015, 12/02/2016, 12/17/2017, 11/22/2018   Influenza Split 12/03/2011   Influenza Whole 11/30/2007, 11/30/2008, 12/03/2009, 11/24/2010   Influenza,inj,Quad PF,6+ Mos 11/10/2012, 11/08/2013, 11/06/2014   Influenza-Unspecified 12/02/2016, 11/22/2018, 11/14/2021, 12/15/2022   PFIZER Comirnaty(Gray Top)Covid-19 Tri-Sucrose Vaccine 05/27/2020   PFIZER(Purple Top)SARS-COV-2 Vaccination 03/14/2019, 04/04/2019, 11/18/2019   Pfizer(Comirnaty)Fall Seasonal Vaccine 12 years and older 11/14/2021   Pneumococcal  Conjugate-13 05/09/2014   Pneumococcal Polysaccharide-23 11/07/2007   Tdap 05/09/2014   Unspecified SARS-COV-2 Vaccination 12/15/2022   Zoster Recombinant(Shingrix) 04/10/2018, 01/08/2019   Zoster, Live 12/11/2009   Health Maintenance Due  Topic Date Due   Influenza Vaccine  09/24/2023      Past Medical History:  Diagnosis Date   Adenomatous colon polyp    Allergic rhinitis    Anemia    pt states no anemia in her past hx thst she is aware of    Atrial fibrillation (HCC)    goes in and out of this rhythm- takes Tenormin    Breast cancer, left breast (HCC) 1994 with lumpectomy   chemo,tamox,radiation   Bronchiectasis with acute exacerbation (HCC)    COPD (chronic obstructive pulmonary disease) (HCC)    Coronary artery disease    Diverticulosis of colon    DJD (degenerative joint disease)    Dysrhythmia    palpitations   Family history of brain cancer    Family history of colon cancer 02/14/2018   Family history of colon cancer    Family history of malignant neoplasm of gastrointestinal tract    Family history of melanoma    Family history of uterine cancer    Fibromyalgia    GERD (gastroesophageal reflux disease) 02/14/2018   Glaucoma    Hemoptysis    History of pneumonia    Hypercholesteremia    pt denies   Hypertension    Low back pain syndrome    Neuromuscular disorder (HCC)  hx fibromyalgia    Osteopenia    Pneumonia    hx   Pseudomonas infection    Rotator cuff syndrome of right shoulder    Spinal stenosis    Past Surgical History:  Procedure Laterality Date   CATARACT EXTRACTION Right    COLONOSCOPY     left breast lumpectomy and LN dissection  06/1992   Dr. Ethyl   LUNG BIOPSY  1968   TSurg---Dr. Vivian   LUNG BIOPSY  2012   dr christi   POLYPECTOMY     REVERSE SHOULDER ARTHROPLASTY Right 08/02/2015   Procedure: RIGHT REVERSE TOTAL SHOULDER ARTHROPLASTY;  Surgeon: Marcey Her, MD;  Location: [facility] OR;  Service: Orthopedics;  Laterality: Right;    REVERSE TOTAL SHOULDER ARTHROPLASTY Right 08/02/2015   TONSILLECTOMY     TOTAL KNEE ARTHROPLASTY Right 01/21/2015   Procedure: TOTAL RIGHT KNEE ARTHROPLASTY;  Surgeon: Donnice Car, MD;  Location: [facility] ORS;  Service: Orthopedics;  Laterality: Right;   TOTAL KNEE ARTHROPLASTY Left 03/23/2016   Procedure: LEFT TOTAL KNEE ARTHROPLASTY;  Surgeon: Donnice Car, MD;  Location: [facility] ORS;  Service: Orthopedics;  Laterality: Left;    reports that she quit smoking about 56 years ago. Her smoking use included cigarettes. She has never used smokeless tobacco. She reports current alcohol use. She reports that she does not use drugs. family history includes Brain cancer in her maternal uncle; Colon cancer (age of onset: 25) in her father; Colon polyps in her daughter; Heart attack in her mother; Hypertension in her brother; Melanoma (age of onset: 28) in her niece; Melanoma (age of onset: 9) in her daughter; Other (age of onset: 79) in her paternal uncle; Stroke in her maternal grandmother; Uterine cancer (age of onset: 29) in her daughter. Allergies  Allergen Reactions   Tramadol  Nausea Only   Current Outpatient Medications on File Prior to Visit  Medication Sig Dispense Refill   albuterol  (PROVENTIL ) (2.5 MG/3ML) 0.083% nebulizer solution Take 3 mLs (2.5 mg total) by nebulization every 6 (six) hours as needed for wheezing or shortness of breath. 75 mL 12   albuterol  (PROVENTIL ) (2.5 MG/3ML) 0.083% nebulizer solution Take 3 mLs (2.5 mg total) by nebulization every 6 (six) hours as needed for wheezing or shortness of breath. 75 mL 12   albuterol  (VENTOLIN  HFA) 108 (90 Base) MCG/ACT inhaler TAKE 2 PUFFS BY MOUTH EVERY 6 HOURS AS NEEDED FOR WHEEZE OR SHORTNESS OF BREATH 8.5 each 6   amoxicillin  (AMOXIL ) 500 MG capsule      ASPIRIN  81 PO Take 1 tablet by mouth daily.     Calcium  Carb-Cholecalciferol  600-800 MG-UNIT TABS Take 1 tablet by mouth daily.     diltiazem  (CARDIZEM  CD) 120 MG 24 hr capsule TAKE 1 CAPSULE BY  MOUTH EVERY DAY 90 capsule 3   guaiFENesin  (MUCINEX ) 600 MG 12 hr tablet Take by mouth 2 (two) times daily.     losartan  (COZAAR ) 50 MG tablet Take 1 tablet (50 mg total) by mouth daily. 90 tablet 3   LUMIGAN 0.01 % SOLN      Multiple Vitamins-Minerals (MULTIVITAMIN & MINERAL PO) Take 1 tablet by mouth daily.     nitroGLYCERIN  (NITROSTAT ) 0.4 MG SL tablet PLACE 1 TABLET (0.4 MG TOTAL) UNDER THE TONGUE EVERY 5 (FIVE) MINUTES AS NEEDED FOR CHEST PAIN. PLEASE CALL [phone] TO MAKE APPOINTMENT WITH DR. ROSS FOR FUTURE REFILLS. 25 tablet 5   Respiratory Therapy Supplies (FLUTTER) DEVI 1 application by Does not apply route 3 (three) times daily. 1 each  0   rosuvastatin  (CRESTOR ) 5 MG tablet Take 1 tablet (5 mg total) by mouth daily. 90 tablet 3   Saccharomyces boulardii (FLORASTOR PO) Take 1 capsule by mouth daily.     sodium chloride  0.9 % nebulizer solution Take 3 mLs by nebulization 2 (two) times daily as needed (cough/congestion). 90 mL 12   Tiotropium Bromide-Olodaterol (STIOLTO RESPIMAT ) 2.5-2.5 MCG/ACT AERS Inhale 2 puffs into the lungs daily. 12 g 3   TURMERIC CURCUMIN PO Take by mouth.     VITAMIN D  PO Take 4,000 Int'l Units by mouth.     Zinc 30 MG CAPS      No current facility-administered medications on file prior to visit.        ROS:  All others reviewed and negative.  Objective        PE:  BP (!) 140/64 (Cuff Size: Normal)   Pulse 79   Temp 97.8 F (36.6 C)   Ht 5' 3 (1.6 m)   Wt 125 lb 3.2 oz (56.8 kg)   LMP 02/23/1985   SpO2 98%   BMI 22.18 kg/m                 Constitutional: Pt appears in NAD               HENT: Head: NCAT.                Right Ear: External ear normal.                 Left Ear: External ear normal.                Eyes: . Pupils are equal, round, and reactive to light. Conjunctivae and EOM are normal               Nose: without d/c or deformity               Neck: Neck supple. Gross normal ROM               Cardiovascular: Normal rate and  regular rhythm.                 Pulmonary/Chest: Effort normal and breath sounds without rales or wheezing.                Abd:  Soft, NT, ND, + BS, no organomegaly\               Lumbar midline approx L3 with marked tender and right paraspinal spasm without skin change               Neurological: Pt is alert. At baseline orientation, motor grossly intact               Skin: Skin is warm. No rashes, no other new lesions, LE edema - none               Psychiatric: Pt behavior is normal without agitation   Micro: none  Cardiac tracings I have personally interpreted today:  none  Pertinent Radiological findings (summarize): none   Lab Results  Component Value Date   WBC 6.7 10/22/2022   HGB 12.5 10/22/2022   HCT 37.2 10/22/2022   PLT 272.0 10/22/2022   GLUCOSE 104 (H) 11/04/2023   CHOL 162 03/24/2022   TRIG 65.0 03/24/2022   HDL 62.60 03/24/2022   LDLCALC 87 03/24/2022   ALT 16 02/23/2023   AST 29 02/23/2023   NA 131 (L) 11/04/2023  K 4.6 11/04/2023   CL 93 (L) 11/04/2023   CREATININE 0.65 11/04/2023   BUN 13 11/04/2023   CO2 23 11/04/2023   TSH 3.24 03/24/2022   INR 1.06 01/14/2015   HGBA1C 5.9 (H) 12/15/2022   Assessment/Plan:  SHENELLE KLAS is a 89 y.o. White or Caucasian [1] female with  has a past medical history of Adenomatous colon polyp, Allergic rhinitis, Anemia, Atrial fibrillation (HCC), Breast cancer, left breast (HCC) (1994 with lumpectomy), Bronchiectasis with acute exacerbation (HCC), COPD (chronic obstructive pulmonary disease) (HCC), Coronary artery disease, Diverticulosis of colon, DJD (degenerative joint disease), Dysrhythmia, Family history of brain cancer, Family history of colon cancer (02/14/2018), Family history of colon cancer, Family history of malignant neoplasm of gastrointestinal tract, Family history of melanoma, Family history of uterine cancer, Fibromyalgia, GERD (gastroesophageal reflux disease) (02/14/2018), Glaucoma, Hemoptysis, History of  pneumonia, Hypercholesteremia, Hypertension, Low back pain syndrome, Neuromuscular disorder (HCC), Osteopenia, Pneumonia, Pseudomonas infection, Rotator cuff syndrome of right shoulder, and Spinal stenosis.  Bronchiectasis (HCC) For PICC line and antibx course to start soon  Encounter for well adult exam with abnormal findings Age and sex appropriate education and counseling updated with regular exercise and diet Referrals for preventative services - none needed Immunizations addressed - for flu shot today Smoking counseling  - none needed Evidence for depression or other mood disorder - none significant Most recent labs reviewed. I have personally reviewed and have noted: 1) the patient's medical and social history 2) The patient's current medications and supplements 3) The patient's height, weight, and BMI have been recorded in the chart   Elevated lipids Lab Results  Component Value Date   LDLCALC 87 03/24/2022   uncontrolled, pt to continue current statin crestor  5 mg ,decline increase for now   Episode of hypertension BP Readings from Last 3 Encounters:  11/09/23 (!) 140/64  11/02/23 (!) 173/78  10/28/23 122/62   Uncontrolled, likely reactive, pt to continue medical treatment card CD 120 every day, losartan  50 mg every day, decline change today   Hyperglycemia Lab Results  Component Value Date   HGBA1C 5.9 (H) 12/15/2022   Stable, pt to continue current medical treatment  - diet, wt control   Low back pain Severe to start now moderate, for hydrocodon 5 325  - 1/2 - 1 qid prn, flexeril  5 tid prn, declines need for prednisone  or gabapentin, and LS spine films today r/o compression fx.  Consider MRI LS spine if abnormal or not improving  Followup: Return in about 6 months (around 05/08/2024).  Lynwood Rush, MD 11/09/2023 1:07 PM [facility]  Primary Care - [facility]

## 2023-11-09 NOTE — Telephone Encounter (Signed)
 Called and spoke with patient, she wanted to know if the IV antibiotic that was prescribed would be worse than the pseudomonas infection that she currently has.  I advised her that Dr. Meade would not prescribe an antibiotic if she felt it would be worse than the infection.  I told her to be on the look out for diarrhea and to call us  if she has bothersome diarrhea that could not be controlled with antidiarrheals.  She does take a daily probiotic.  Advised to call the office with any further questions.  She verbalized understanding.

## 2023-11-11 ENCOUNTER — Ambulatory Visit (HOSPITAL_COMMUNITY)
Admission: RE | Admit: 2023-11-11 | Discharge: 2023-11-11 | Disposition: A | Discharge status: Home / Self Care | Source: Ambulatory Visit | Attending: Internal Medicine | Admitting: Internal Medicine

## 2023-11-11 ENCOUNTER — Telehealth (HOSPITAL_COMMUNITY): Payer: Self-pay

## 2023-11-11 ENCOUNTER — Encounter (HOSPITAL_COMMUNITY): Payer: Self-pay

## 2023-11-11 DIAGNOSIS — J471 Bronchiectasis with (acute) exacerbation: Secondary | ICD-10-CM | POA: Insufficient documentation

## 2023-11-11 DIAGNOSIS — Z539 Procedure and treatment not carried out, unspecified reason: Secondary | ICD-10-CM | POA: Diagnosis not present

## 2023-11-11 MED ORDER — HEPARIN SOD (PORK) LOCK FLUSH 100 UNIT/ML IV SOLN: 500.0000 [IU] | Freq: Once | INTRAVENOUS | Status: DC

## 2023-11-11 MED ORDER — HEPARIN SOD (PORK) LOCK FLUSH 100 UNIT/ML IV SOLN
INTRAVENOUS | Status: AC
  Filled 2023-11-11: qty 5

## 2023-11-11 MED ORDER — LIDOCAINE-EPINEPHRINE 1 %-1:100000 IJ SOLN: 20.0000 mL | Freq: Once | INTRAMUSCULAR | Status: DC

## 2023-11-11 MED ORDER — LIDOCAINE-EPINEPHRINE 1 %-1:100000 IJ SOLN
INTRAMUSCULAR | Status: AC
  Filled 2023-11-11: qty 1

## 2023-11-11 NOTE — Telephone Encounter (Signed)
 Called to schedule picc, no answer, left vm. AB

## 2023-11-11 NOTE — Progress Notes (Signed)
  IR BRIEF PROGRESS NOTE:  Patient presented for PICC placement as scheduled, with husband and daughter. Patient believed IR would be administering her first dose of Abx today, which is unfortunately not the case. Therefore, Patient refused PICC placement today as she did not want it placed 5 day out in advance of first dose with ID at appointment on 9/23. Radiology scheduler aware, and will reach out to patient at earliest convenience to reschedule patient. Patient is aware availability of appointments may delay treatment.   Electronically Signed: Carlin DELENA Griffon, PA-C 11/11/2023, 3:07 PM

## 2023-11-12 ENCOUNTER — Encounter: Payer: Self-pay | Admitting: Internal Medicine

## 2023-11-14 ENCOUNTER — Ambulatory Visit: Payer: Self-pay | Admitting: Internal Medicine

## 2023-11-15 ENCOUNTER — Telehealth (HOSPITAL_COMMUNITY): Payer: Self-pay

## 2023-11-15 NOTE — Telephone Encounter (Signed)
 Called to reschedule picc placement, pt would like to wait until she has her echo tomorrow and will try and reschedule on the day that she sees her infectious disease doctor. AB

## 2023-11-16 ENCOUNTER — Ambulatory Visit: Admitting: Internal Medicine

## 2023-11-16 ENCOUNTER — Telehealth: Payer: Self-pay | Admitting: Internal Medicine

## 2023-11-16 ENCOUNTER — Other Ambulatory Visit: Payer: Self-pay

## 2023-11-16 ENCOUNTER — Encounter: Payer: Self-pay | Admitting: Internal Medicine

## 2023-11-16 ENCOUNTER — Ambulatory Visit (HOSPITAL_COMMUNITY)
Admission: RE | Admit: 2023-11-16 | Discharge: 2023-11-16 | Disposition: A | Discharge status: Home / Self Care | Source: Ambulatory Visit | Attending: Internal Medicine | Admitting: Internal Medicine

## 2023-11-16 VITALS — BP 145/76 | HR 83 | Temp 97.8°F | Ht 63.0 in | Wt 125.0 lb

## 2023-11-16 DIAGNOSIS — R0609 Other forms of dyspnea: Secondary | ICD-10-CM | POA: Insufficient documentation

## 2023-11-16 DIAGNOSIS — B965 Pseudomonas (aeruginosa) (mallei) (pseudomallei) as the cause of diseases classified elsewhere: Secondary | ICD-10-CM | POA: Diagnosis not present

## 2023-11-16 DIAGNOSIS — J479 Bronchiectasis, uncomplicated: Secondary | ICD-10-CM | POA: Diagnosis not present

## 2023-11-16 LAB — ECHOCARDIOGRAM COMPLETE
Area-P 1/2: 4.21 cm2
S' Lateral: 2.6 cm

## 2023-11-16 NOTE — Patient Instructions (Addendum)
 Reviewed ct and discussed symptoms ->clinically stable.  Discussed colonization with pseudomonas in lungs. Urine cultures on 9/9 no growth If pt has symptoms, fever, increased sputum production, worsening shortness of breath then go to ed for further management. Hold off on IV antibiotics

## 2023-11-16 NOTE — Telephone Encounter (Signed)
 I have placed a document in your mailbox that needs to be filled out and signed for the pt to get the Vest from [employer].  Thanks!

## 2023-11-16 NOTE — Progress Notes (Signed)
 Patient: Miranda Thompson  DOB: October 21, 1933 MRN: [id number] PCP: Norleen Lynwood ORN, MD    Patient Active Problem List   Diagnosis Date Noted   Pseudomonas aeruginosa colonization 04/29/2023   Lung nodules 04/29/2023   Wheezing 01/14/2023   Productive cough 01/10/2023   Suprapubic mass 10/22/2022   RUQ pain 10/22/2022   Dyspnea 10/22/2022   UTI (urinary tract infection) 10/22/2022   Aphthous stomatitis 04/02/2022   DOE (dyspnea on exertion) 01/02/2022   Cervicalgia 10/02/2021   Low back pain 09/15/2020   Aortic atherosclerosis 09/11/2020   Submandibular gland mass 03/20/2020   Hyponatremia 03/20/2020   Hyperglycemia 02/20/2019   Genetic testing 11/11/2018   Family history of uterine cancer    Family history of brain cancer    GERD (gastroesophageal reflux disease) 02/14/2018   Glaucoma 02/14/2018   Family history of colon cancer 02/14/2018   Encounter for well adult exam with abnormal findings 02/14/2018   Osteoporosis 02/14/2018   History of left breast cancer 01/13/2018   S/P knee replacement 03/23/2016   S/P shoulder replacement 08/02/2015   Palpitations 08/20/2014   Episode of hypertension 08/20/2014   COPD, moderate (HCC) 05/09/2014   Premature atrial contractions 11/08/2013   Cardiac arrhythmia 05/10/2013   Trochanteric bursitis of left hip 02/02/2012   Osteoarthritis of left hip 06/25/2011   Leg length inequality 06/25/2011   Left ovarian cyst 01/20/2011   Elevated lipids 11/04/2009   Acute cystitis 11/04/2009   LOW BACK PAIN SYNDROME 05/07/2008   Bronchiectasis (HCC) 03/18/2007   Hemoptysis 03/18/2007   PNEUMONIA 03/07/2007   Disorder of bone and cartilage 03/07/2007   BRONCHIECTASIS 03/04/2007   Osteoarthritis 03/04/2007     Subjective:  Miranda Thompson is a24 year old female with history of low back pain, A-fib, breast cancer s/p lumpectomy and a 88, COPD, CAD, chronic bronchiectasis and Pseudomonas colonization followed by pulmonology.  She was  referred by Dr. Cain for evaluation of chronic lung condition and Pseudomonas colonizat . Pt states when she has acute resp ion.  Noted that longstanding history of bronchiectasis diagnosed R 97/7-/88.  And has been colonized with Pseudomonas approximately 30 years.  She had increased sputum production with bile-like appearance.  Resp cultures on 9/4 grew Pseudomonas resistant to fluoroquinolones.  She was seen by pulmonology on 9/9 for bronchiectasis with acute exacerbation and chronic Pseudomonas arsenal colonization.  She was referred to infectious disease for PICC line placement and long-term antibiotics. Today seh notes she feels fatigueld. Pt is more chob now. Pt shtates when she has acute resp infeciton: fever, a lot of sputum production, tighthtmness in chest and back. Pt has been using nebulizer about twice a day in Septimeber. She has had neublizer for more thatn 8 months. PT is doing pistrual drianing with getting sputum up.  Pt states she had one episde of about a handfull of yellow sputums( about 6 wekks about 9d/8) ROS  Past Medical History:  Diagnosis Date   Adenomatous colon polyp    Allergic rhinitis    Anemia    pt states no anemia in her past hx thst she is aware of    Atrial fibrillation (HCC)    goes in and out of this rhythm- takes Tenormin    Breast cancer, left breast (HCC) 1994 with lumpectomy   chemo,tamox,radiation   Bronchiectasis with acute exacerbation (HCC)    COPD (chronic obstructive pulmonary disease) (HCC)    Coronary artery disease    Diverticulosis of colon    DJD (degenerative joint  disease)    Dysrhythmia    palpitations   Family history of brain cancer    Family history of colon cancer 02/14/2018   Family history of colon cancer    Family history of malignant neoplasm of gastrointestinal tract    Family history of melanoma    Family history of uterine cancer    Fibromyalgia    GERD (gastroesophageal reflux disease) 02/14/2018   Glaucoma     Hemoptysis    History of pneumonia    Hypercholesteremia    pt denies   Hypertension    Low back pain syndrome    Neuromuscular disorder (HCC)    hx fibromyalgia    Osteopenia    Pneumonia    hx   Pseudomonas infection    Rotator cuff syndrome of right shoulder    Spinal stenosis     Outpatient Medications Prior to Visit  Medication Sig Dispense Refill   albuterol  (PROVENTIL ) (2.5 MG/3ML) 0.083% nebulizer solution Take 3 mLs (2.5 mg total) by nebulization every 6 (six) hours as needed for wheezing or shortness of breath. 75 mL 12   albuterol  (PROVENTIL ) (2.5 MG/3ML) 0.083% nebulizer solution Take 3 mLs (2.5 mg total) by nebulization every 6 (six) hours as needed for wheezing or shortness of breath. 75 mL 12   albuterol  (VENTOLIN  HFA) 108 (90 Base) MCG/ACT inhaler TAKE 2 PUFFS BY MOUTH EVERY 6 HOURS AS NEEDED FOR WHEEZE OR SHORTNESS OF BREATH 8.5 each 6   amoxicillin  (AMOXIL ) 500 MG capsule      ASPIRIN  81 PO Take 1 tablet by mouth daily.     Calcium  Carb-Cholecalciferol  600-800 MG-UNIT TABS Take 1 tablet by mouth daily.     cyclobenzaprine  (FLEXERIL ) 5 MG tablet Take 1 tablet (5 mg total) by mouth 3 (three) times daily as needed. 40 tablet 1   diltiazem  (CARDIZEM  CD) 120 MG 24 hr capsule TAKE 1 CAPSULE BY MOUTH EVERY DAY 90 capsule 3   guaiFENesin  (MUCINEX ) 600 MG 12 hr tablet Take by mouth 2 (two) times daily.     HYDROcodone -acetaminophen  (NORCO/VICODIN) 5-325 MG tablet Take 1 tablet by mouth every 6 (six) hours as needed. 30 tablet 0   losartan  (COZAAR ) 50 MG tablet Take 1 tablet (50 mg total) by mouth daily. 90 tablet 3   LUMIGAN 0.01 % SOLN      Multiple Vitamins-Minerals (MULTIVITAMIN & MINERAL PO) Take 1 tablet by mouth daily.     nitroGLYCERIN  (NITROSTAT ) 0.4 MG SL tablet PLACE 1 TABLET (0.4 MG TOTAL) UNDER THE TONGUE EVERY 5 (FIVE) MINUTES AS NEEDED FOR CHEST PAIN. PLEASE CALL [phone] TO MAKE APPOINTMENT WITH DR. ROSS FOR FUTURE REFILLS. 25 tablet 5   Respiratory Therapy  Supplies (FLUTTER) DEVI 1 application by Does not apply route 3 (three) times daily. 1 each 0   rosuvastatin  (CRESTOR ) 5 MG tablet Take 1 tablet (5 mg total) by mouth daily. 90 tablet 3   Saccharomyces boulardii (FLORASTOR PO) Take 1 capsule by mouth daily.     sodium chloride  0.9 % nebulizer solution Take 3 mLs by nebulization 2 (two) times daily as needed (cough/congestion). 90 mL 12   Tiotropium Bromide-Olodaterol (STIOLTO RESPIMAT ) 2.5-2.5 MCG/ACT AERS Inhale 2 puffs into the lungs daily. 12 g 3   TURMERIC CURCUMIN PO Take by mouth.     VITAMIN D  PO Take 4,000 Int'l Units by mouth.     Zinc 30 MG CAPS      No facility-administered medications prior to visit.     Allergies  Allergen Reactions   Tramadol  Nausea Only    Social History   Tobacco Use   Smoking status: Former    Current packs/day: 0.00    Types: Cigarettes    Quit date: 02/24/1967    Years since quitting: 56.7   Smokeless tobacco: Never  Vaping Use   Vaping status: Never Used  Substance Use Topics   Alcohol use: Yes    Comment: occasionally   Drug use: No    Family History  Problem Relation Age of Onset   Heart attack Mother        Deceased age 26   Colon cancer Father 44       deceased age 58 from colon ca.   Hypertension Brother    Stroke Maternal Grandmother    Colon polyps Daughter    Uterine cancer Daughter 3   Melanoma Daughter 63   Brain cancer Maternal Uncle    Other Paternal Uncle 12       possible cancer   Melanoma Niece 55       d. 67   Esophageal cancer Neg Hx    Stomach cancer Neg Hx    Rectal cancer Neg Hx    Ovarian cancer Neg Hx    Prostate cancer Neg Hx    Breast cancer Neg Hx     Objective:  There were no vitals filed for this visit. There is no height or weight on file to calculate BMI.  Physical Exam  Lab Results: Lab Results  Component Value Date   WBC 6.7 10/22/2022   HGB 12.5 10/22/2022   HCT 37.2 10/22/2022   MCV 98.1 10/22/2022   PLT 272.0 10/22/2022     Lab Results  Component Value Date   CREATININE 0.65 11/04/2023   BUN 13 11/04/2023   NA 131 (L) 11/04/2023   K 4.6 11/04/2023   CL 93 (L) 11/04/2023   CO2 23 11/04/2023    Lab Results  Component Value Date   ALT 16 02/23/2023   AST 29 02/23/2023   ALKPHOS 86 02/23/2023   BILITOT 0.7 02/23/2023     Assessment & Plan:   #Acute exacerbation of bronchiectasis in the setting of colonization with Pseudomonas aeruginosa resistant to fluconazole  - She was seen in pulmonology office on 9/9 for worsening of symptoms, unusual sputum production that was bile-like.  She has had colonization history of Pseudomonas aeruginosa for 30+ years.  She was referred to infectious disease PICC line placement and IV antibiotics.  I reviewed the last imaging of chest that was done on 8/12 which showed bronchiectasis bilateral lower lobes.  Scattered pulmonary nodules recommended workup.  Gas/fluid in patulous esophagus suggestive of gastroesophageal reflux. -Sent to ID for IV abx management. She has not had PICC. She had a one time episde on 9/8 of yellow sputums(bile like) production. She staes she felt better afterwards. Symptoms baseline since then.  Plan: -Discussed hold off on IV abx at this time. Discussed treat symptomatic episodes, colonization does not require iv/oral abx treatment. If pt has symptoms, fever, increased sputum production, worsening shortness of breath then go to ed for further management vs starting outpatient IV abx. Of note pt sated fatigue has been notable x 8 months, which is less c/w acute exacerbaton.  -Continue to follow with pulm for management of bronchiectasis.  On literature review, tobra can be used off label for chronic bronchiectasia with 3>= episodes of exacerbation/year. Copay $50. Relayed to pulm for management as it is off label and limited  data available.  -F/U with ID PRN  Loney Stank, MD [facility]   11/16/23  8:07 AM I have personally spent 112 minutes involved in face-to-face and non-face-to-face activities for this patient on the day of the visit. Professional time spent includes the following activities: Preparing to see the patient (review of tests), Obtaining and/or reviewing separately obtained history (admission/discharge record), Performing a medically appropriate examination and/or evaluation , Ordering medications/tests/procedures, referring and communicating with other health care professionals, Documenting clinical information in the EMR, Independently interpreting results (not separately reported), Communicating results to the patient/family/caregiver, Counseling and educating the patient/family/caregiver and Care coordination (not separately reported).

## 2023-11-17 ENCOUNTER — Telehealth: Payer: Self-pay | Admitting: Internal Medicine

## 2023-11-17 ENCOUNTER — Other Ambulatory Visit (HOSPITAL_COMMUNITY): Payer: Self-pay

## 2023-11-17 NOTE — Telephone Encounter (Signed)
 Called pt.  Left message to call back.Plan to see if tobra inhaled is covered.

## 2023-11-17 NOTE — Telephone Encounter (Signed)
 Form (CMN from [employer]) for Vest was placed in Dr. Correne mail box for signature.  Placed in Dr. Correne folder for signature.

## 2023-11-20 DIAGNOSIS — J471 Bronchiectasis with (acute) exacerbation: Secondary | ICD-10-CM | POA: Diagnosis not present

## 2023-11-25 NOTE — Telephone Encounter (Signed)
 Reviewed echo   LV and RV function normal   No new recommendations

## 2023-12-06 NOTE — Telephone Encounter (Signed)
 Form signed by Dr. Emmie and faxed to [employer] [phone].  Nothing further needed.

## 2023-12-13 ENCOUNTER — Telehealth: Payer: Self-pay | Admitting: *Deleted

## 2023-12-13 NOTE — Telephone Encounter (Signed)
 Copied from CRM #[id number]. Topic: Clinical - Order For Equipment >> Dec 10, 2023 11:28 AM Nathanel DEL wrote: Reason for CRM: Marcos, the rep for [employer] therapy called to ask where this order could be fulfilled at a local DME co.   I pulled up pt's last DME referral, which was to [facility]. Marcos will be sending this vest through [facility] if that is OK. If you have an questions/concerns she can be reached at [phone]  Noted.  Nothing further needed.

## 2023-12-15 ENCOUNTER — Ambulatory Visit (INDEPENDENT_AMBULATORY_CARE_PROVIDER_SITE_OTHER)

## 2023-12-15 ENCOUNTER — Ambulatory Visit: Payer: Self-pay | Admitting: Pulmonary Disease

## 2023-12-15 ENCOUNTER — Encounter (HOSPITAL_BASED_OUTPATIENT_CLINIC_OR_DEPARTMENT_OTHER): Payer: Self-pay

## 2023-12-15 ENCOUNTER — Other Ambulatory Visit: Payer: Self-pay | Admitting: Emergency Medicine

## 2023-12-15 VITALS — BP 137/72 | HR 89 | Ht 63.0 in | Wt 125.0 lb

## 2023-12-15 DIAGNOSIS — R918 Other nonspecific abnormal finding of lung field: Secondary | ICD-10-CM | POA: Diagnosis not present

## 2023-12-15 DIAGNOSIS — J8410 Pulmonary fibrosis, unspecified: Secondary | ICD-10-CM | POA: Diagnosis not present

## 2023-12-15 DIAGNOSIS — R042 Hemoptysis: Secondary | ICD-10-CM

## 2023-12-15 DIAGNOSIS — J449 Chronic obstructive pulmonary disease, unspecified: Secondary | ICD-10-CM

## 2023-12-15 DIAGNOSIS — Z96611 Presence of right artificial shoulder joint: Secondary | ICD-10-CM | POA: Diagnosis not present

## 2023-12-15 DIAGNOSIS — J479 Bronchiectasis, uncomplicated: Secondary | ICD-10-CM | POA: Diagnosis not present

## 2023-12-15 LAB — CBC WITH DIFFERENTIAL/PLATELET
Basophils Absolute: 0.1 10*3/uL (ref 0.0–0.2)
Basos: 2 %
EOS (ABSOLUTE): 0.2 10*3/uL (ref 0.0–0.4)
Eos: 3 %
Hematocrit: 35.1 % (ref 34.0–46.6)
Hemoglobin: 12.2 g/dL (ref 11.1–15.9)
Immature Grans (Abs): 0 10*3/uL (ref 0.0–0.1)
Immature Granulocytes: 0 %
Lymphocytes Absolute: 1 10*3/uL (ref 0.7–3.1)
Lymphs: 17 %
MCH: 34 pg — ABNORMAL HIGH (ref 26.6–33.0)
MCHC: 34.8 g/dL (ref 31.5–35.7)
MCV: 98 fL — ABNORMAL HIGH (ref 79–97)
Monocytes Absolute: 0.6 10*3/uL (ref 0.1–0.9)
Monocytes: 10 %
Neutrophils Absolute: 4.2 10*3/uL (ref 1.4–7.0)
Neutrophils: 68 %
Platelets: 257 10*3/uL (ref 150–450)
RBC: 3.59 x10E6/uL — ABNORMAL LOW (ref 3.77–5.28)
RDW: 11.4 % — ABNORMAL LOW (ref 11.7–15.4)
WBC: 6.1 10*3/uL (ref 3.4–10.8)

## 2023-12-15 NOTE — Telephone Encounter (Signed)
 FYI Only or Action Required?: FYI only for provider.  Patient is followed in Pulmonology for COPD, last seen on 11/02/2023 by Meade Verdon RAMAN, MD.  Called Nurse Triage reporting Cough.  Symptoms began yesterday.  Interventions attempted: Rescue inhaler, Maintenance inhaler, and Nebulizer treatments.  Symptoms are: unchanged.  Triage Disposition: See HCP Within 4 Hours (Or PCP Triage)  Patient/caregiver understands and will follow disposition?: Yes  E2C2 Pulmonary Triage - Initial Assessment Questions "Chief Complaint (e.g., cough, sob, wheezing, fever, chills, sweat or additional symptoms) *Go to specific symptom protocol after initial questions. Patient reports an episode of coughing up blood early this morning. Patient reports enough blood to fill a tissue. Endorses mild cough for the last couple of days. Patient states she hasn't had any other episode. Reports continued chest tightness and shortness of breath which she states is at her baseline. Good use of inhalers and nebulizer. Scheduled for acute visit today at 1:30 PM.  "How long have symptoms been present?" Coughing up blood started today-1 eoisode  Have you tested for COVID or Flu? Note: If not, ask patient if a home test can be taken. If so, instruct patient to call back for positive results. No  MEDICINES:   "Have you used any OTC meds to help with symptoms?" No If yes, ask "What medications?"   "Have you used your inhalers/maintenance medication?" Yes If yes, "What medications?" Albuterol  inhaler/nebulizer Stiolto  If inhaler, ask "How many puffs and how often?" Note: Review instructions on medication in the chart. Stiolto 2 puffs Daily Albuterol  2 puffs Q6H  OXYGEN: "Do you wear supplemental oxygen?" No If yes, "How many liters are you supposed to use?"   "Do you monitor your oxygen levels?" Yes If yes, What is your reading (oxygen level) today? 97%  What is your usual oxygen saturation reading?   (Note: Pulmonary O2 sats should be 90% or greater) 94-99%   Copied from CRM #[id number]. Topic: Clinical - Red Word Triage >> Dec 15, 2023  8:23 AM Leila BROCKS wrote: Red Word that prompted transfer to Nurse Triage: Patient [phone] states spit up blood last night, chest tightness, shortness of breath (has COPD), and poor balance. Patient denies fever. Patient wants to see Dr. Kara or NP, Malachy today. Please advise. Reason for Disposition  [1] Coughed up blood AND [2] > 1 tablespoon (15 ml)  Answer Assessment - Initial Assessment Questions 1. ONSET: When did the cough begin?      Chronic cough 2. SEVERITY: How bad is the cough today? Did the blood appear after a coughing spell?      Mild cough the last couple of days. Patient states she could feel something in her throat-tried to clear her throat and a significant amount of blood came out. Only happened once 3. SPUTUM: Describe the color of your sputum (e.g., none, dry cough; clear, white, yellow, green)     No sputum 4. HEMOPTYSIS: How much blood? (e.g., flecks, streaks, tablespoons)     Pure blood. Patient reports that she brought up enough blood to fill a tissue with.  5. DIFFICULTY BREATHING: Are you having difficulty breathing? If Yes, ask: How bad is it? (e.g., mild, moderate, severe)      Moderate shortness of breath-at her baseline.  6. FEVER: Do you have a fever? If Yes, ask: What is your temperature, how was it measured, and when did it start?     no 7. CARDIAC HISTORY: Do you have any history of heart disease? (e.g., heart attack,  congestive heart failure)      yes 8. LUNG HISTORY: Do you have any history of lung disease?  (e.g., pulmonary embolus, asthma, emphysema)     yes 9. PE RISK FACTORS: Do you have a history of blood clots? Note: Other risk factors include recent major surgery, recent prolonged travel, being bedridden.    no 10. OTHER SYMPTOMS: Do you have any other symptoms? (e.g., runny nose,  wheezing, chest pain)       Chest tightness 12. TRAVEL: Have you traveled out of the country in the last month? (e.g., travel history, exposures)       no  Protocols used: Coughing Up Blood-A-AH

## 2023-12-15 NOTE — Assessment & Plan Note (Signed)
-    Plan for sputum culture with AFB smear to r/o infectious causes other than her baseline Pseudomonas -  Check Chest XR  -  ENT referral with goal of upper laryngoscopy to r/o upper source/posterior nose bleed -  Check CBC to quantify blood loss -  Close follow up in 2 weeks; instructed to go to the ED if new or worsening symptoms

## 2023-12-15 NOTE — Progress Notes (Signed)
 @Patient  ID: Miranda Thompson, female    DOB: [birth year], 89 y.o.   MRN: [id number]  Chief Complaint  Patient presents with   Acute Visit    Coughing up blood    Referring provider: Norleen Lynwood ORN, MD  HPI: Discussed the use of AI scribe software for clinical note transcription with the patient, who gave verbal consent to proceed.  History of Present Illness Miranda Thompson is an 89 year old female with bronchiectasis and Pseudomonas colonization who presents with her daughter for a c/o hemoptysis.  She experienced hemoptysis at midnight, coughing up bright red blood that filled a Kleenex, without associated phlegm. Initially, the blood was dark but became bright red. Over the past month, she has had occasional hemoptysis, typically with sputum, but this recent episode involved only blood. No recent changes in medication or activities that could have precipitated the bleeding. No recent epistaxis or nasal polyps, although she had a minor nasal injury from a plastic container. She experiences a gurgling sensation in her throat prior to hemoptysis, without significant coughing.  She has a history of bronchiectasis and Pseudomonas colonization for approximately 30 years, with periodic follow-ups with pulmonology and infectious disease specialists. She had a CT scan of her chest in August. Her pulmonary hygiene routine includes albuterol  nebulizers, saline nebulizers, and a flutter device, but she does not use a chest vest. She does not require supplemental oxygen and maintains good oxygen levels.  Her current medications include low-dose aspirin , losartan , diltiazem , and a generic form of Crestor . Her losartan  dosage was recently increased from 12.5 mg to 50 mg. She has a history of hypertension and possible atrial fibrillation, for which she takes aspirin .  She reports chronic chest tightness and abdominal pain, with a history of Pseudomonas in her urine during UTIs. She experiences poor  balance but does not feel faint.  She has a sputum cup with her today which has a small amount of dark blood in it.   TEST/EVENTS :  11/16/2023 ID note: Miranda Thompson is a73 year old female with history of low back pain, A-fib, breast cancer s/p lumpectomy and a 94, COPD, CAD, chronic bronchiectasis and Pseudomonas colonization followed by pulmonology.  She was referred by Dr. Cain for evaluation of chronic lung condition and Pseudomonas colonizat . Pt states when she has acute resp ion.  Noted that longstanding history of bronchiectasis diagnosed R 97/7-/88.  And has been colonized with Pseudomonas approximately 30 years.  She had increased sputum production with bile-like appearance.  Resp cultures on 9/4 grew Pseudomonas resistant to fluoroquinolones.  She was seen by pulmonology on 9/9 for bronchiectasis with acute exacerbation and chronic Pseudomonas arsenal colonization.  She was referred to infectious disease for PICC line placement and long-term antibiotics. Today seh notes she feels fatigueld. Pt is more chob now. Pt shtates when she has acute resp infeciton: fever, a lot of sputum production, tighthtmness in chest and back. Pt has been using nebulizer about twice a day in Septimeber. She has had neublizer for more thatn 8 months. PT is doing pistrual drianing with getting sputum up.  Pt states she had one episde of about a handfull of yellow sputums( about 6 wekks about 9d/8)   Allergies  Allergen Reactions   Tramadol  Nausea Only    Immunization History  Administered Date(s) Administered   Fluad Quad(high Dose 65+) 11/08/2019, 11/14/2020, 11/19/2021   INFLUENZA, HIGH DOSE SEASONAL PF 11/19/2015, 12/02/2016, 12/17/2017, 11/22/2018   Influenza Split 12/03/2011   Influenza Whole  11/30/2007, 11/30/2008, 12/03/2009, 11/24/2010   Influenza,inj,Quad PF,6+ Mos 11/10/2012, 11/08/2013, 11/06/2014   Influenza-Unspecified 12/02/2016, 11/22/2018, 11/14/2021, 12/15/2022   PFIZER  Comirnaty(Gray Top)Covid-19 Tri-Sucrose Vaccine 05/27/2020   PFIZER(Purple Top)SARS-COV-2 Vaccination 03/14/2019, 04/04/2019, 11/18/2019   Pfizer(Comirnaty)Fall Seasonal Vaccine 12 years and older 11/14/2021   Pneumococcal Conjugate-13 05/09/2014   Pneumococcal Polysaccharide-23 11/07/2007   Tdap 05/09/2014   Unspecified SARS-COV-2 Vaccination 12/15/2022   Zoster Recombinant(Shingrix) 04/10/2018, 01/08/2019   Zoster, Live 12/11/2009    Past Medical History:  Diagnosis Date   Adenomatous colon polyp    Allergic rhinitis    Anemia    pt states no anemia in her past hx thst she is aware of    Atrial fibrillation (HCC)    goes in and out of this rhythm- takes Tenormin    Breast cancer, left breast (HCC) 1994 with lumpectomy   chemo,tamox,radiation   Bronchiectasis with acute exacerbation (HCC)    COPD (chronic obstructive pulmonary disease) (HCC)    Coronary artery disease    Diverticulosis of colon    DJD (degenerative joint disease)    Dysrhythmia    palpitations   Family history of brain cancer    Family history of colon cancer 02/14/2018   Family history of colon cancer    Family history of malignant neoplasm of gastrointestinal tract    Family history of melanoma    Family history of uterine cancer    Fibromyalgia    GERD (gastroesophageal reflux disease) 02/14/2018   Glaucoma    Hemoptysis    History of pneumonia    Hypercholesteremia    pt denies   Hypertension    Low back pain syndrome    Neuromuscular disorder (HCC)    hx fibromyalgia    Osteopenia    Pneumonia    hx   Pseudomonas infection    Rotator cuff syndrome of right shoulder    Spinal stenosis     Tobacco History: Social History   Tobacco Use  Smoking Status Former   Current packs/day: 0.00   Types: Cigarettes   Quit date: 02/24/1967   Years since quitting: 56.8  Smokeless Tobacco Never   Counseling given: Not Answered   Outpatient Medications Prior to Visit  Medication Sig Dispense Refill    albuterol  (PROVENTIL ) (2.5 MG/3ML) 0.083% nebulizer solution Take 3 mLs (2.5 mg total) by nebulization every 6 (six) hours as needed for wheezing or shortness of breath. 75 mL 12   albuterol  (PROVENTIL ) (2.5 MG/3ML) 0.083% nebulizer solution Take 3 mLs (2.5 mg total) by nebulization every 6 (six) hours as needed for wheezing or shortness of breath. 75 mL 12   albuterol  (VENTOLIN  HFA) 108 (90 Base) MCG/ACT inhaler TAKE 2 PUFFS BY MOUTH EVERY 6 HOURS AS NEEDED FOR WHEEZE OR SHORTNESS OF BREATH 8.5 each 6   amoxicillin  (AMOXIL ) 500 MG capsule      ASPIRIN  81 PO Take 1 tablet by mouth daily.     Calcium  Carb-Cholecalciferol  600-800 MG-UNIT TABS Take 1 tablet by mouth daily.     cyclobenzaprine  (FLEXERIL ) 5 MG tablet Take 1 tablet (5 mg total) by mouth 3 (three) times daily as needed. 40 tablet 1   diltiazem  (CARDIZEM  CD) 120 MG 24 hr capsule TAKE 1 CAPSULE BY MOUTH EVERY DAY 90 capsule 3   guaiFENesin  (MUCINEX ) 600 MG 12 hr tablet Take by mouth 2 (two) times daily.     HYDROcodone -acetaminophen  (NORCO/VICODIN) 5-325 MG tablet Take 1 tablet by mouth every 6 (six) hours as needed. 30 tablet 0   losartan  (  COZAAR ) 50 MG tablet Take 1 tablet (50 mg total) by mouth daily. 90 tablet 3   LUMIGAN 0.01 % SOLN      Multiple Vitamins-Minerals (MULTIVITAMIN & MINERAL PO) Take 1 tablet by mouth daily.     nitroGLYCERIN  (NITROSTAT ) 0.4 MG SL tablet PLACE 1 TABLET (0.4 MG TOTAL) UNDER THE TONGUE EVERY 5 (FIVE) MINUTES AS NEEDED FOR CHEST PAIN. PLEASE CALL [phone] TO MAKE APPOINTMENT WITH DR. ROSS FOR FUTURE REFILLS. 25 tablet 5   Respiratory Therapy Supplies (FLUTTER) DEVI 1 application by Does not apply route 3 (three) times daily. 1 each 0   rosuvastatin  (CRESTOR ) 5 MG tablet Take 1 tablet (5 mg total) by mouth daily. 90 tablet 3   Saccharomyces boulardii (FLORASTOR PO) Take 1 capsule by mouth daily.     sodium chloride  0.9 % nebulizer solution Take 3 mLs by nebulization 2 (two) times daily as needed  (cough/congestion). 90 mL 12   Tiotropium Bromide-Olodaterol (STIOLTO RESPIMAT ) 2.5-2.5 MCG/ACT AERS Inhale 2 puffs into the lungs daily. 12 g 3   TURMERIC CURCUMIN PO Take by mouth.     VITAMIN D  PO Take 4,000 Int'l Units by mouth.     Zinc 30 MG CAPS      No facility-administered medications prior to visit.     Review of Systems: as per HPI  Constitutional:   No  weight loss, night sweats,  Fevers, chills, fatigue, or  lassitude.  HEENT:   No headaches,  Difficulty swallowing,  Tooth/dental problems, or  Sore throat,                No sneezing, itching, ear ache, nasal congestion, post nasal drip,   CV:  No chest pain,  Orthopnea, PND, swelling in lower extremities, anasarca, dizziness, palpitations, syncope.   GI  No heartburn, indigestion, abdominal pain, nausea, vomiting, diarrhea, change in bowel habits, loss of appetite, bloody stools.   Resp: No shortness of breath with exertion or at rest.  No excess mucus, no productive cough,  No non-productive cough,  No coughing up of blood.  No change in color of mucus.  No wheezing.  No chest wall deformity  Skin: no rash or lesions.  GU: no dysuria, change in color of urine, no urgency or frequency.  No flank pain, no hematuria   MS:  No joint pain or swelling.  No decreased range of motion.  No back pain.    Physical Exam  BP 137/72   Pulse 89   Ht 5' 3 (1.6 m)   Wt 125 lb (56.7 kg)   LMP 02/23/1985   SpO2 98%   BMI 22.14 kg/m   GEN: A/Ox3; pleasant , NAD, well nourished.  AOx3   HEENT:  [place]/AT,  EACs-clear, TMs-wnl, NOSE-no obvious bleeding or postnasal drip, THROAT-clear, no lesions, no postnasal drip or exudate noted.   NECK:  Supple w/ fair ROM; no JVD; normal carotid impulses w/o bruits; no thyromegaly or nodules palpated; no lymphadenopathy.    RESP  Clear  P & A; w/o, wheezes/ rales/ or rhonchi. no accessory muscle use, no dullness to percussion  CARD:  RRR, no m/r/g, no peripheral edema, pulses intact, no  cyanosis or clubbing.  GI:   Soft & nt; nml bowel sounds; no organomegaly or masses detected.   Musco: Warm bil, no deformities or joint swelling noted.   Neuro: alert, no focal deficits noted.    Skin: Warm, no lesions or rashes    Lab Results:  CBC  Component Value Date/Time   WBC 6.7 10/22/2022 1402   RBC 3.79 (L) 10/22/2022 1402   HGB 12.5 10/22/2022 1402   HGB 12.3 04/10/2021 1538   HCT 37.2 10/22/2022 1402   HCT 35.8 04/10/2021 1538   PLT 272.0 10/22/2022 1402   PLT 252 04/10/2021 1538   MCV 98.1 10/22/2022 1402   MCV 96 04/10/2021 1538   MCH 32.9 04/10/2021 1538   MCH 32.3 10/11/2016 1131   MCHC 33.5 10/22/2022 1402   RDW 12.6 10/22/2022 1402   RDW 11.4 (L) 04/10/2021 1538   LYMPHSABS 1.2 10/22/2022 1402   MONOABS 0.6 10/22/2022 1402   EOSABS 0.2 10/22/2022 1402   BASOSABS 0.1 10/22/2022 1402    BMET    Component Value Date/Time   NA 131 (L) 11/04/2023 1307   K 4.6 11/04/2023 1307   CL 93 (L) 11/04/2023 1307   CO2 23 11/04/2023 1307   GLUCOSE 104 (H) 11/04/2023 1307   GLUCOSE 88 10/22/2022 1402   BUN 13 11/04/2023 1307   CREATININE 0.65 11/04/2023 1307   CALCIUM  9.0 11/04/2023 1307   GFRNONAA >60 10/11/2016 1131   GFRAA >60 10/11/2016 1131    BNP No results found for: BNP  ProBNP No results found for: PROBNP  Imaging: ECHOCARDIOGRAM COMPLETE Result Date: 11/16/2023    ECHOCARDIOGRAM REPORT   Patient Name:   Miranda Thompson Date of Exam: 11/16/2023 Medical Rec #:  [id number]        Height:       63.0 in Accession #:    [id number]       Weight:       125.2 lb Date of Birth:  [birth year]        BSA:          1.585 m Patient Age:    89 years         BP:           140/64 mmHg Patient Gender: F                HR:           78 bpm. Exam Location:  [address] Procedure: 2D Echo, Cardiac Doppler, Color Doppler and 3D Echo (Both Spectral            and Color Flow Doppler were utilized during procedure). Indications:    Pulmonary hypertension I27.2   History:        Patient has prior history of Echocardiogram examinations, most                 recent 10/11/2021. CAD, COPD, Arrythmias:Atrial Fibrillation;                 Risk Factors:Hypertension.  Sonographer:    Augustin Seals RDCS Referring Phys: [id number] DORN KATHEE CHILL IMPRESSIONS  1. Left ventricular ejection fraction, by estimation, is 60 to 65%. The left ventricle has normal function. The left ventricle has no regional wall motion abnormalities. There is mild left ventricular hypertrophy. Left ventricular diastolic parameters are consistent with Grade I diastolic dysfunction (impaired relaxation).  2. Right ventricular systolic function is normal. The right ventricular size is normal. There is normal pulmonary artery systolic pressure. The estimated right ventricular systolic pressure is 22.0 mmHg.  3. The mitral valve is normal in structure. Trivial mitral valve regurgitation. No evidence of mitral stenosis.  4. The aortic valve was not well visualized. Aortic valve regurgitation is not visualized. No aortic stenosis is present.  5. The inferior vena cava  is normal in size with greater than 50% respiratory variability, suggesting right atrial pressure of 3 mmHg. FINDINGS  Left Ventricle: Left ventricular ejection fraction, by estimation, is 60 to 65%. The left ventricle has normal function. The left ventricle has no regional wall motion abnormalities. The left ventricular internal cavity size was small. There is mild left ventricular hypertrophy. Left ventricular diastolic parameters are consistent with Grade I diastolic dysfunction (impaired relaxation). Right Ventricle: The right ventricular size is normal. No increase in right ventricular wall thickness. Right ventricular systolic function is normal. There is normal pulmonary artery systolic pressure. The tricuspid regurgitant velocity is 2.18 m/s, and  with an assumed right atrial pressure of 3 mmHg, the estimated right ventricular systolic  pressure is 22.0 mmHg. Left Atrium: Left atrial size was normal in size. Right Atrium: Right atrial size was normal in size. Pericardium: There is no evidence of pericardial effusion. Mitral Valve: The mitral valve is normal in structure. Trivial mitral valve regurgitation. No evidence of mitral valve stenosis. Tricuspid Valve: The tricuspid valve is normal in structure. Tricuspid valve regurgitation is trivial. Aortic Valve: The aortic valve was not well visualized. Aortic valve regurgitation is not visualized. No aortic stenosis is present. Pulmonic Valve: The pulmonic valve was grossly normal. Pulmonic valve regurgitation is trivial. Aorta: The aortic root and ascending aorta are structurally normal, with no evidence of dilitation. Venous: The inferior vena cava is normal in size with greater than 50% respiratory variability, suggesting right atrial pressure of 3 mmHg. IAS/Shunts: No atrial level shunt detected by color flow Doppler.  LEFT VENTRICLE PLAX 2D LVIDd:         3.60 cm   Diastology LVIDs:         2.60 cm   LV e' medial:    6.64 cm/s LV PW:         1.10 cm   LV E/e' medial:  10.6 LV IVS:        0.70 cm   LV e' lateral:   7.72 cm/s LVOT diam:     2.10 cm   LV E/e' lateral: 9.1 LV SV:         50 LV SV Index:   31 LVOT Area:     3.46 cm  RIGHT VENTRICLE             IVC RV Basal diam:  3.80 cm     IVC diam: 1.00 cm RV Mid diam:    2.60 cm RV S prime:     15.90 cm/s TAPSE (M-mode): 2.7 cm LEFT ATRIUM             Index        RIGHT ATRIUM           Index LA diam:        3.00 cm 1.89 cm/m   RA Area:     10.70 cm LA Vol (A2C):   29.8 ml 18.81 ml/m  RA Volume:   24.40 ml  15.40 ml/m LA Vol (A4C):   36.3 ml 22.91 ml/m LA Biplane Vol: 33.3 ml 21.01 ml/m  AORTIC VALVE LVOT Vmax:   76.50 cm/s LVOT Vmean:  52.400 cm/s LVOT VTI:    0.144 m  AORTA Ao Root diam: 2.80 cm Ao Asc diam:  3.10 cm MITRAL VALVE               TRICUSPID VALVE MV Area (PHT): 4.21 cm    TR Peak grad:   19.0 mmHg MV Decel Time:  180 msec     TR Vmax:        218.00 cm/s MV E velocity: 70.20 cm/s MV A velocity: 93.30 cm/s  SHUNTS MV E/A ratio:  0.75        Systemic VTI:  0.14 m                            Systemic Diam: 2.10 cm Lonni Nanas MD Electronically signed by Lonni Nanas MD Signature Date/Time: 11/16/2023/5:26:47 PM    Final     Administration History     None           No data to display          No results found for: NITRICOXIDE   Assessment & Plan:  Miranda Thompson is an 89 year old female with bronchiectasis and Pseudomonas colonization who presents with her daughter for a c/o hemoptysis.  Certainly she is at an increased risk for bleeding while on aspirin  therapy, but it seems as though the bleeding is not associated with a harsh cough, making it more difficult to parse out the source. Assessment & Plan Hemoptysis -  Plan for sputum culture with AFB smear to r/o infectious causes other than her baseline Pseudomonas -  Check Chest XR  -  ENT referral with goal of upper laryngoscopy to r/o upper source/posterior nose bleed -  Check CBC to quantify blood loss -  Close follow up in 2 weeks; instructed to go to the ED if new or worsening symptoms BRONCHIECTASIS -  No change in current regimen COPD, moderate (HCC)    Return in about 2 weeks (around 12/29/2023).  Candis Dandy, PA-C 12/15/2023

## 2023-12-15 NOTE — Patient Instructions (Addendum)
 Complete CBC and sputum culture with AFB.  Follow up with ENT.  Complete Chest XR.  Return to clinic in 2 weeks for follow up.  Go to ED if worsening hemoptysis, fevers, or shortness of breath.

## 2023-12-15 NOTE — Assessment & Plan Note (Signed)
No change in current regimen. 

## 2023-12-17 ENCOUNTER — Other Ambulatory Visit: Payer: Self-pay | Admitting: Internal Medicine

## 2023-12-20 ENCOUNTER — Emergency Department (HOSPITAL_BASED_OUTPATIENT_CLINIC_OR_DEPARTMENT_OTHER)

## 2023-12-20 ENCOUNTER — Other Ambulatory Visit: Payer: Self-pay

## 2023-12-20 ENCOUNTER — Emergency Department (HOSPITAL_BASED_OUTPATIENT_CLINIC_OR_DEPARTMENT_OTHER)
Admission: EM | Admit: 2023-12-20 | Discharge: 2023-12-20 | Disposition: A | Discharge status: Home / Self Care | Source: Ambulatory Visit | Attending: Emergency Medicine | Admitting: Emergency Medicine

## 2023-12-20 ENCOUNTER — Encounter (HOSPITAL_BASED_OUTPATIENT_CLINIC_OR_DEPARTMENT_OTHER): Payer: Self-pay

## 2023-12-20 ENCOUNTER — Ambulatory Visit: Payer: Self-pay

## 2023-12-20 ENCOUNTER — Encounter: Payer: Self-pay | Admitting: Internal Medicine

## 2023-12-20 DIAGNOSIS — N3001 Acute cystitis with hematuria: Secondary | ICD-10-CM | POA: Insufficient documentation

## 2023-12-20 DIAGNOSIS — K5730 Diverticulosis of large intestine without perforation or abscess without bleeding: Secondary | ICD-10-CM | POA: Diagnosis not present

## 2023-12-20 DIAGNOSIS — K8689 Other specified diseases of pancreas: Secondary | ICD-10-CM | POA: Diagnosis not present

## 2023-12-20 DIAGNOSIS — E871 Hypo-osmolality and hyponatremia: Secondary | ICD-10-CM | POA: Diagnosis not present

## 2023-12-20 DIAGNOSIS — J471 Bronchiectasis with (acute) exacerbation: Secondary | ICD-10-CM | POA: Diagnosis not present

## 2023-12-20 DIAGNOSIS — R1030 Lower abdominal pain, unspecified: Secondary | ICD-10-CM | POA: Diagnosis present

## 2023-12-20 DIAGNOSIS — D649 Anemia, unspecified: Secondary | ICD-10-CM | POA: Insufficient documentation

## 2023-12-20 DIAGNOSIS — R1032 Left lower quadrant pain: Secondary | ICD-10-CM | POA: Diagnosis not present

## 2023-12-20 LAB — CBC WITH DIFFERENTIAL/PLATELET
Abs Immature Granulocytes: 0.03 K/uL (ref 0.00–0.07)
Basophils Absolute: 0.1 K/uL (ref 0.0–0.1)
Basophils Relative: 1 %
Eosinophils Absolute: 0.1 K/uL (ref 0.0–0.5)
Eosinophils Relative: 2 %
HCT: 32.4 % — ABNORMAL LOW (ref 36.0–46.0)
Hemoglobin: 11.3 g/dL — ABNORMAL LOW (ref 12.0–15.0)
Immature Granulocytes: 0 %
Lymphocytes Relative: 11 %
Lymphs Abs: 0.8 K/uL (ref 0.7–4.0)
MCH: 33.6 pg (ref 26.0–34.0)
MCHC: 34.9 g/dL (ref 30.0–36.0)
MCV: 96.4 fL (ref 80.0–100.0)
Monocytes Absolute: 0.6 K/uL (ref 0.1–1.0)
Monocytes Relative: 8 %
Neutro Abs: 6 K/uL (ref 1.7–7.7)
Neutrophils Relative %: 78 %
Platelets: 237 K/uL (ref 150–400)
RBC: 3.36 MIL/uL — ABNORMAL LOW (ref 3.87–5.11)
RDW: 11.9 % (ref 11.5–15.5)
WBC: 7.7 K/uL (ref 4.0–10.5)
nRBC: 0 % (ref 0.0–0.2)

## 2023-12-20 LAB — COMPREHENSIVE METABOLIC PANEL WITH GFR
ALT: 15 U/L (ref 0–44)
AST: 28 U/L (ref 15–41)
Albumin: 4.1 g/dL (ref 3.5–5.0)
Alkaline Phosphatase: 78 U/L (ref 38–126)
Anion gap: 8 (ref 5–15)
BUN: 13 mg/dL (ref 8–23)
CO2: 27 mmol/L (ref 22–32)
Calcium: 9.6 mg/dL (ref 8.9–10.3)
Chloride: 91 mmol/L — ABNORMAL LOW (ref 98–111)
Creatinine, Ser: 0.64 mg/dL (ref 0.44–1.00)
GFR, Estimated: >60 mL/min (ref >60)
Glucose, Bld: 150 mg/dL — ABNORMAL HIGH (ref 70–99)
Potassium: 4.5 mmol/L (ref 3.5–5.1)
Sodium: 127 mmol/L — ABNORMAL LOW (ref 135–145)
Total Bilirubin: 0.5 mg/dL (ref 0.0–1.2)
Total Protein: 7.5 g/dL (ref 6.5–8.1)

## 2023-12-20 LAB — URINALYSIS, ROUTINE W REFLEX MICROSCOPIC
Bilirubin Urine: NEGATIVE
Glucose, UA: NEGATIVE mg/dL
Ketones, ur: NEGATIVE mg/dL
Nitrite: POSITIVE — AB
Specific Gravity, Urine: 1.016 (ref 1.005–1.030)
WBC, UA: >50 WBC/hpf (ref 0–5)
pH: 6.5 (ref 5.0–8.0)

## 2023-12-20 MED ORDER — NITROFURANTOIN MONOHYD MACRO 100 MG PO CAPS: 100.0000 mg | ORAL_CAPSULE | Freq: Two times a day (BID) | ORAL | 0 refills | Status: DC

## 2023-12-20 MED ORDER — SODIUM CHLORIDE 0.9 % IV BOLUS
500.0000 mL | Freq: Once | INTRAVENOUS | Status: AC
  Administered 2023-12-20: 500 mL via INTRAVENOUS

## 2023-12-20 MED ORDER — SODIUM CHLORIDE 0.9 % IV SOLN
1.0000 g | Freq: Once | INTRAVENOUS | Status: AC
  Administered 2023-12-20: 1 g via INTRAVENOUS
  Filled 2023-12-20: qty 10

## 2023-12-20 MED ORDER — DICYCLOMINE HCL 10 MG PO CAPS
20.0000 mg | ORAL_CAPSULE | Freq: Once | ORAL | Status: AC
Start: 2023-12-20 — End: 2023-12-20
  Administered 2023-12-20: 20 mg via ORAL
  Filled 2023-12-20: qty 2

## 2023-12-20 MED ORDER — IOHEXOL 300 MG/ML  SOLN
100.0000 mL | Freq: Once | INTRAMUSCULAR | Status: AC | PRN
  Administered 2023-12-20: 100 mL via INTRAVENOUS

## 2023-12-20 NOTE — Discharge Instructions (Addendum)
 Please follow up with your primary care doctor for re-check of labs later this week.

## 2023-12-20 NOTE — ED Triage Notes (Signed)
 Pt c/o lower abd pain nagging for a couple weeks/ since August, worsening in the last 2 weeks/ days. Associated lower R back pain, denies urinary symptoms

## 2023-12-20 NOTE — Telephone Encounter (Signed)
 FYI Only or Action Required?: FYI only for provider.  Patient was last seen in primary care on 11/09/2023 by Norleen Lynwood ORN, MD.  Called Nurse Triage reporting Abdominal Pain.  Symptoms began yesterday.  Interventions attempted: Rest, hydration, or home remedies.  Symptoms are: rapidly worsening.  Triage Disposition: Go to ED Now (Notify PCP)  Patient/caregiver understands and will follow disposition?: Yes  Copied from CRM [id number]. Topic: Clinical - Red Word Triage >> Dec 20, 2023  2:48 PM Ashley R wrote: Red Word that prompted transfer to Nurse Triage: back pain, worsening, lower right abdomen discomfort, hard to sit down.    ----------------------------------------------------------------------- From previous Reason for Contact - Scheduling: Patient/patient representative is calling to schedule an appointment. Refer to attachments for appointment information. Reason for Disposition  [1] SEVERE pain (e.g., excruciating) AND [2] present > 1 hour  Answer Assessment - Initial Assessment Questions Abdominal pain that has progressed for the last few weeks. Nagging discomfort since August, but over last 2 days has become sharper and shooting to the RLQ. Radiating to the whole abdomen. 8/10 pain. Daughter in background states pt has high pain tolerance and never complains so pain is likely higher. Pain making it very uncomfortable to sit. Confirms she still has her appendix Has back pain as well with hx of Degenerative Disk Disease.   Coughing up blood last week. Coughed up blood for 1 day. Saw Pulm and cleared. Eating and drinking ok. No diarrhea.  COPD but at baseline for SOB. DDD- that pain has worsened over last few months  Pt advised to go to the ED where diagnostic tools we do not have in the office can help determine causation. Pt understands and will go to ED.   1. LOCATION: Where does it hurt?      RLQ- 2. RADIATION: Does the pain shoot anywhere else? (e.g., chest, back)      Radiating to the whole abdomen now 3. ONSET: When did the pain begin? (e.g., minutes, hours or days ago)      Worse last 2 days  4. SUDDEN: Gradual or sudden onset?     Gradual then sudden worsening 5. PATTERN Does the pain come and go, or is it constant?     Constant  6. SEVERITY: How bad is the pain?  (e.g., Scale 1-10; mild, moderate, or severe)     8/10 7. RECURRENT SYMPTOM: Have you ever had this type of stomach pain before? If Yes, ask: When was the last time? and What happened that time?      Some discomfort to the area since August.  8. CAUSE: What do you think is causing the stomach pain? (e.g., gallstones, recent abdominal surgery)     unsure 9. RELIEVING/AGGRAVATING FACTORS: What makes it better or worse? (e.g., antacids, bending or twisting motion, bowel movement)     nothing 10. OTHER SYMPTOMS: Do you have any other symptoms? (e.g., back pain, diarrhea, fever, urination pain, vomiting)       Back pain has DDD. Denies N/V/D, fever, CP, SOB  Protocols used: Abdominal Pain - Female-A-AH

## 2023-12-20 NOTE — ED Provider Notes (Signed)
 [facility] AT [facility] Provider Note   CSN: [id number] Arrival date & time: 12/20/23  1608     Patient presents with: Abdominal Pain   Miranda Thompson is a 89 y.o. female.    Abdominal Pain  Patient is an 89 year old female present emergency room today for complaints of lower abdominal pain that she feels has been ongoing for several months since August she denies any specific aggravating or mitigating factors states that has been better and worse at different times of further questioning it seems that she has been treated for UTI during this period of time and possibly symptoms may have improved however over the past few days she has been noticing more discomfort in her lower abdomen.  She states that she has been pooping normally no bloody or black or tarry poop.  She denies any nausea or vomiting.  No fevers or chills.  Denies any dysuria.        Prior to Admission medications   Medication Sig Start Date End Date Taking? Authorizing Provider  nitrofurantoin , macrocrystal-monohydrate, (MACROBID ) 100 MG capsule Take 1 capsule (100 mg total) by mouth 2 (two) times daily. 12/20/23  Yes Bibi Economos S, PA  albuterol  (PROVENTIL ) (2.5 MG/3ML) 0.083% nebulizer solution Take 3 mLs (2.5 mg total) by nebulization every 6 (six) hours as needed for wheezing or shortness of breath. 11/02/23   Meade Verdon RAMAN, MD  albuterol  (PROVENTIL ) (2.5 MG/3ML) 0.083% nebulizer solution Take 3 mLs (2.5 mg total) by nebulization every 6 (six) hours as needed for wheezing or shortness of breath. 11/02/23   Meade Verdon RAMAN, MD  albuterol  (VENTOLIN  HFA) 108 [phone] Base) MCG/ACT inhaler TAKE 2 PUFFS BY MOUTH EVERY 6 HOURS AS NEEDED FOR WHEEZE OR SHORTNESS OF BREATH 02/09/23   Icard, Adine CROME, DO  amoxicillin  (AMOXIL ) 500 MG capsule  03/03/23   [provider]  ASPIRIN  81 PO Take 1 tablet by mouth daily.    [provider]  Calcium  Carb-Cholecalciferol  600-800 MG-UNIT  TABS Take 1 tablet by mouth daily.    [provider]  cyclobenzaprine  (FLEXERIL ) 5 MG tablet Take 1 tablet (5 mg total) by mouth 3 (three) times daily as needed. 11/09/23   Norleen Lynwood ORN, MD  diltiazem  (CARDIZEM  CD) 120 MG 24 hr capsule TAKE 1 CAPSULE BY MOUTH EVERY DAY 11/03/23   Okey Vina GAILS, MD  guaiFENesin  (MUCINEX ) 600 MG 12 hr tablet Take by mouth 2 (two) times daily.    [provider]  HYDROcodone -acetaminophen  (NORCO/VICODIN) 5-325 MG tablet Take 1 tablet by mouth every 6 (six) hours as needed. 11/09/23   Norleen Lynwood ORN, MD  losartan  (COZAAR ) 50 MG tablet Take 1 tablet (50 mg total) by mouth daily. 11/02/23   Okey Vina GAILS, MD  LUMIGAN 0.01 % SOLN  04/15/18   [provider]  Multiple Vitamins-Minerals (MULTIVITAMIN & MINERAL PO) Take 1 tablet by mouth daily.    [provider]  nitroGLYCERIN  (NITROSTAT ) 0.4 MG SL tablet PLACE 1 TABLET (0.4 MG TOTAL) UNDER THE TONGUE EVERY 5 (FIVE) MINUTES AS NEEDED FOR CHEST PAIN. PLEASE CALL [phone] TO MAKE APPOINTMENT WITH DR. ROSS FOR FUTURE REFILLS. 06/02/23   Okey Vina GAILS, MD  Respiratory Therapy Supplies (FLUTTER) DEVI 1 application by Does not apply route 3 (three) times daily. 06/22/18   Oley Bascom RAMAN, NP  rosuvastatin  (CRESTOR ) 5 MG tablet Take 1 tablet (5 mg total) by mouth daily. 12/18/22   Okey Vina GAILS, MD  Saccharomyces boulardii [facility]  PO) Take 1 capsule by mouth daily.    [provider]  sodium chloride  0.9 % nebulizer solution Take 3 mLs by nebulization 2 (two) times daily as needed (cough/congestion). 11/02/23   Desai, Nikita S, MD  Tiotropium Bromide-Olodaterol (STIOLTO RESPIMAT ) 2.5-2.5 MCG/ACT AERS Inhale 2 puffs into the lungs daily. 10/15/23   Kara Dorn NOVAK, MD  TURMERIC CURCUMIN PO Take by mouth.    [provider]  VITAMIN D  PO Take 4,000 Int'l Units by mouth.    [provider]  Zinc 30 MG CAPS  05/12/17   [provider]    Allergies: Tramadol      Review of Systems  Gastrointestinal:  Positive for abdominal pain.    Updated Vital Signs BP (!) 142/59   Pulse 80   Temp 98.3 F (36.8 C)   Resp 18   LMP 02/23/1985   SpO2 99%   Physical Exam Vitals and nursing note reviewed.  Constitutional:      General: She is not in acute distress.    Comments: Pleasant well-appearing 89 year old.  In no acute distress.  Sitting comfortably in bed.  Able answer questions appropriately follow commands. No increased work of breathing. Speaking in full sentences.   HENT:     Head: Normocephalic and atraumatic.     Nose: Nose normal.     Mouth/Throat:     Mouth: Mucous membranes are moist.  Eyes:     General: No scleral icterus. Cardiovascular:     Rate and Rhythm: Normal rate and regular rhythm.     Pulses: Normal pulses.     Heart sounds: Normal heart sounds.  Pulmonary:     Effort: Pulmonary effort is normal. No respiratory distress.     Breath sounds: No wheezing.  Abdominal:     Palpations: Abdomen is soft.     Tenderness: There is abdominal tenderness in the suprapubic area.  Musculoskeletal:     Cervical back: Normal range of motion.     Right lower leg: No edema.     Left lower leg: No edema.  Skin:    General: Skin is warm and dry.     Capillary Refill: Capillary refill takes less than 2 seconds.  Neurological:     Mental Status: She is alert. Mental status is at baseline.  Psychiatric:        Mood and Affect: Mood normal.        Behavior: Behavior normal.     (all labs ordered are listed, but only abnormal results are displayed) Labs Reviewed  COMPREHENSIVE METABOLIC PANEL WITH GFR - Abnormal; Notable for the following components:      Result Value   Sodium 127 (*)    Chloride 91 (*)    Glucose, Bld 150 (*)    All other components within normal limits  CBC WITH DIFFERENTIAL/PLATELET - Abnormal; Notable for the following components:   RBC 3.36 (*)    Hemoglobin 11.3 (*)    HCT 32.4 (*)    All other  components within normal limits  URINALYSIS, ROUTINE W REFLEX MICROSCOPIC - Abnormal; Notable for the following components:   APPearance CLOUDY (*)    Hgb urine dipstick SMALL (*)    Protein, ur TRACE (*)    Nitrite POSITIVE (*)    Leukocytes,Ua LARGE (*)    Bacteria, UA RARE (*)    All other components within normal limits  URINE CULTURE    EKG: None  Radiology: CT ABDOMEN PELVIS W CONTRAST Result Date: 12/20/2023  EXAM: CT ABDOMEN AND PELVIS WITH CONTRAST 12/20/2023 06:37:47 PM TECHNIQUE: CT of the abdomen and pelvis was performed with the administration of 100 mL of iohexol  (OMNIPAQUE ) 300 MG/ML solution. Multiplanar reformatted images are provided for review. Automated exposure control, iterative reconstruction, and/or weight-based adjustment of the mA/kV was utilized to reduce the radiation dose to as low as reasonably achievable. COMPARISON: CT abdomen and pelvis 10/05/2023.MRI pelvis 05/04/2023 CLINICAL HISTORY: LLQ abdominal pain; suprapubic and lower abd pain. Pt c/o lower abd pain nagging for a couple weeks/ since August, worsening in the last 2 weeks/ days. Associated lower R back pain, denies urinary symptoms. FINDINGS: LOWER CHEST: Right base pulmonary scarring similar to prior. LIVER: The liver is unremarkable. GALLBLADDER AND BILE DUCTS: Gallbladder is unremarkable. No biliary ductal dilatation. SPLEEN: No acute abnormality. PANCREAS: Diffusely atrophic. No focal lesion. Otherwise normal pancreatic contour. No surrounding inflammatory changes. No main pancreatic ductal dilatation. ADRENAL GLANDS: No acute abnormality. KIDNEYS, URETERS AND BLADDER: No stones in the kidneys or ureters. No hydronephrosis. No perinephric or periureteral stranding. No filling defects of the partially visualized collecting systems on delayed imaging. Urinary bladder is unremarkable. GI AND BOWEL: Stomach demonstrates no acute abnormality. No small or large bowel wall thickening. Colonic diverticulosis  with no acute diverticulitis. The appendix is not definitely identified with no inflammatory changes in the right lower quadrant to suggest acute appendicitis. PERITONEUM AND RETROPERITONEUM: No ascites. No free air. VASCULATURE: Severe atherosclerotic plaque of the aorta and its main branches. No aortic aneurysm. LYMPH NODES: No lymphadenopathy. REPRODUCTIVE ORGANS: The uterus is unremarkable. There is a grossly stable 4 cm simple fluid-appearing cystic lesion within the left adnexa. No right adnexal mass. BONES AND SOFT TISSUES: Multilevel severe degenerative changes of the spine. Levoscoliosis of the lumbar spine. No suspicious lytic or blastic osseous lesions. No focal soft tissue abnormality. IMPRESSION: 1. No acute findings in the abdomen or pelvis. 2. Colonic diverticulosis without diverticulitis. 3. Left adnexal simple-appearing cyst measuring 4 cm. Given MRI pelvis 05/04/2023 recommendations, recommend pelvic ultrasound for further evaluation. 4. Levoscoliosis of the lumbar spine with associated severe degenerative changes. 5. Severe atherosclerotic plaque. Electronically signed by: Morgane Naveau MD 12/20/2023 07:19 PM EDT RP Workstation: [id number]     Procedures   Medications Ordered in the ED  cefTRIAXone  (ROCEPHIN ) 1 g in sodium chloride  0.9 % 100 mL IVPB (0 g Intravenous Stopped 12/20/23 1805)  dicyclomine (BENTYL) capsule 20 mg (20 mg Oral Given 12/20/23 1731)  sodium chloride  0.9 % bolus 500 mL (0 mLs Intravenous Stopped 12/20/23 1834)  iohexol  (OMNIPAQUE ) 300 MG/ML solution 100 mL (100 mLs Intravenous Contrast Given 12/20/23 1829)                                    Medical Decision Making Amount and/or Complexity of Data Reviewed Labs: ordered. Radiology: ordered.  Risk Prescription drug management.   This patient presents to the ED for concern of ab dpain, this involves a number of treatment options, and is a complaint that carries with it a moderate to high risk of  complications and morbidity. A differential diagnosis was considered for the patient's symptoms which is discussed below:   The causes of generalized abdominal pain include but are not limited to AAA, mesenteric ischemia, appendicitis, diverticulitis, DKA, gastritis, gastroenteritis, AMI, nephrolithiasis, pancreatitis, peritonitis, adrenal insufficiency,lead poisoning, iron toxicity, intestinal ischemia, constipation, UTI,SBO/LBO, splenic rupture, biliary disease, IBD, IBS, PUD, or hepatitis. PID/ovarian torsion thought  to be less likely in this scenario.   Co morbidities: Discussed in HPI   Brief History:  Patient is an 89 year old female present emergency room today for complaints of lower abdominal pain that she feels has been ongoing for several months since August she denies any specific aggravating or mitigating factors states that has been better and worse at different times of further questioning it seems that she has been treated for UTI during this period of time and possibly symptoms may have improved however over the past few days she has been noticing more discomfort in her lower abdomen.  She states that she has been pooping normally no bloody or black or tarry poop.  She denies any nausea or vomiting.  No fevers or chills.  Denies any dysuria.     EMR reviewed including pt PMHx, past surgical history and past visits to ER.   See HPI for more details   Lab Tests:   I ordered and independently interpreted labs. Labs notable for Mild hyponatremia of 127 patient is ambulating well does not feel particularly fatigued.  Recommend increased p.o. sodium and patient given 1 dose of 500 mL of normal saline.  CBC with mild anemia of 11.3 no leukocytosis urinalysis with bacteria, greater than 50 WBCs, positive for leukocytes and nitrites.  Urine culture obtained and pending, will treat with Rocephin  1 g IV and discharged on Macrobid .  Imaging Studies:  NAD. I personally reviewed all  imaging studies and no acute abnormality found. I agree with radiology interpretation. Does have a ovarian cyst   Cardiac Monitoring:  The patient was maintained on a cardiac monitor.  I personally viewed and interpreted the cardiac monitored which showed an underlying rhythm of: NSR NA   Medicines ordered:  I ordered medication including Rocephin , Bentyl, normal saline for hydration pain and antibiosis Reevaluation of the patient after these medicines showed that the patient improved I have reviewed the patients home medicines and have made adjustments as needed   Critical Interventions:     Consults/Attending Physician      Reevaluation:  After the interventions noted above I re-evaluated patient and found that they have :improved   Social Determinants of Health:      Problem List / ED Course:  Patient with evidence of UTI has had ongoing abdominal pain for some time but seemingly worse recently.  Seems that she was recently treated for UTI over this period of time.  Maybe had incomplete treatment.  Urine culture ordered and in process.   Dispostion:  After consideration of the diagnostic results and the patients response to treatment, I feel that the patent would benefit from discharge and close follow up.     Final diagnoses:  Acute cystitis with hematuria  Hyponatremia  Anemia, unspecified type    ED Discharge Orders          Ordered    Ambulatory referral to Gastroenterology        12/20/23 2003    nitrofurantoin , macrocrystal-monohydrate, (MACROBID ) 100 MG capsule  2 times daily        12/20/23 2006               Neldon Inoue [place], [place] 12/20/23 2044    Jerrol Agent, MD 12/20/23 2115

## 2023-12-22 LAB — URINE CULTURE: Culture: >=100000 — AB

## 2023-12-23 ENCOUNTER — Ambulatory Visit: Payer: Self-pay | Admitting: Emergency Medicine

## 2023-12-23 ENCOUNTER — Ambulatory Visit (INDEPENDENT_AMBULATORY_CARE_PROVIDER_SITE_OTHER): Admitting: Emergency Medicine

## 2023-12-23 ENCOUNTER — Telehealth (HOSPITAL_BASED_OUTPATIENT_CLINIC_OR_DEPARTMENT_OTHER): Payer: Self-pay | Admitting: *Deleted

## 2023-12-23 ENCOUNTER — Encounter: Payer: Self-pay | Admitting: Emergency Medicine

## 2023-12-23 VITALS — BP 138/68 | HR 75 | Temp 97.5°F | Ht 63.0 in | Wt 124.0 lb

## 2023-12-23 DIAGNOSIS — E871 Hypo-osmolality and hyponatremia: Secondary | ICD-10-CM

## 2023-12-23 DIAGNOSIS — D649 Anemia, unspecified: Secondary | ICD-10-CM | POA: Diagnosis not present

## 2023-12-23 DIAGNOSIS — N390 Urinary tract infection, site not specified: Secondary | ICD-10-CM | POA: Diagnosis not present

## 2023-12-23 DIAGNOSIS — B9620 Unspecified Escherichia coli [E. coli] as the cause of diseases classified elsewhere: Secondary | ICD-10-CM

## 2023-12-23 DIAGNOSIS — Z09 Encounter for follow-up examination after completed treatment for conditions other than malignant neoplasm: Secondary | ICD-10-CM | POA: Diagnosis not present

## 2023-12-23 DIAGNOSIS — R739 Hyperglycemia, unspecified: Secondary | ICD-10-CM | POA: Diagnosis not present

## 2023-12-23 LAB — CBC WITH DIFFERENTIAL/PLATELET
Basophils Absolute: 0.1 K/uL (ref 0.0–0.1)
Basophils Relative: 1.4 % (ref 0.0–3.0)
Eosinophils Absolute: 0.3 K/uL (ref 0.0–0.7)
Eosinophils Relative: 6.4 % — ABNORMAL HIGH (ref 0.0–5.0)
HCT: 35.6 % — ABNORMAL LOW (ref 36.0–46.0)
Hemoglobin: 12.3 g/dL (ref 12.0–15.0)
Lymphocytes Relative: 16.1 % (ref 12.0–46.0)
Lymphs Abs: 0.8 K/uL (ref 0.7–4.0)
MCHC: 34.5 g/dL (ref 30.0–36.0)
MCV: 96.6 fl (ref 78.0–100.0)
Monocytes Absolute: 0.4 K/uL (ref 0.1–1.0)
Monocytes Relative: 9.5 % (ref 3.0–12.0)
Neutro Abs: 3.1 K/uL (ref 1.4–7.7)
Neutrophils Relative %: 66.6 % (ref 43.0–77.0)
Platelets: 283 K/uL (ref 150.0–400.0)
RBC: 3.69 Mil/uL — ABNORMAL LOW (ref 3.87–5.11)
RDW: 12.3 % (ref 11.5–15.5)
WBC: 4.7 K/uL (ref 4.0–10.5)

## 2023-12-23 LAB — URINALYSIS, ROUTINE W REFLEX MICROSCOPIC
Bilirubin Urine: NEGATIVE
Hgb urine dipstick: NEGATIVE
Ketones, ur: NEGATIVE
Nitrite: NEGATIVE
RBC / HPF: NONE SEEN (ref 0–?)
Specific Gravity, Urine: 1.01 (ref 1.000–1.030)
Total Protein, Urine: NEGATIVE
Urine Glucose: NEGATIVE
Urobilinogen, UA: 0.2 (ref 0.0–1.0)
pH: 7 (ref 5.0–8.0)

## 2023-12-23 LAB — COMPREHENSIVE METABOLIC PANEL WITH GFR
ALT: 15 U/L (ref 0–35)
AST: 25 U/L (ref 0–37)
Albumin: 4.1 g/dL (ref 3.5–5.2)
Alkaline Phosphatase: 73 U/L (ref 39–117)
BUN: 17 mg/dL (ref 6–23)
CO2: 27 meq/L (ref 19–32)
Calcium: 9.1 mg/dL (ref 8.4–10.5)
Chloride: 93 meq/L — ABNORMAL LOW (ref 96–112)
Creatinine, Ser: 0.58 mg/dL (ref 0.40–1.20)
GFR: 80.69 mL/min (ref >60.00)
Glucose, Bld: 95 mg/dL (ref 70–99)
Potassium: 4.6 meq/L (ref 3.5–5.1)
Sodium: 127 meq/L — ABNORMAL LOW (ref 135–145)
Total Bilirubin: 0.6 mg/dL (ref 0.2–1.2)
Total Protein: 7.8 g/dL (ref 6.0–8.3)

## 2023-12-23 LAB — HEMOGLOBIN A1C: Hgb A1c MFr Bld: 6 % (ref 4.6–6.5)

## 2023-12-23 NOTE — Assessment & Plan Note (Signed)
 Clinically stable and improving. Urine culture positive for E. coli sensitive to Macrobid  Continue Macrobid  twice a day Repeat urinalysis and urine culture today Advised to stay well-hydrated and follow-up with PCP within the next couple weeks

## 2023-12-23 NOTE — Assessment & Plan Note (Signed)
 History of chronic hyponatremia Last sodium 127 Asymptomatic Will repeat today Management discussed

## 2023-12-23 NOTE — Patient Instructions (Signed)
Hyponatremia Hyponatremia is when the amount of salt (sodium) in your blood is too low. When salt levels are low, your body cells may take in extra water. This can cause swelling. The swelling often affects the brain. What are the causes? Certain medical problems or conditions. Vomiting a lot. Having watery poop (diarrhea) often. Sweating too much. Taking certain medicines or using illegal drugs. Fluids given through an IV tube. What increases the risk? Having heart, kidney, or liver failure. Having a medical condition that causes you to have watery poop a lot. Doing very hard exercises. Taking medicines that affect the amount of salt that is in your blood. What are the signs or symptoms? Symptoms of this condition include: Headache. Feeling like you may vomit (nausea). Vomiting. Being very tired. Muscle weakness and cramps. Not wanting to eat as much as normal. Feeling weak or dizzy. Very bad symptoms of this condition include: Confusion. Feeling restless. Having a fast heart rate. Fainting. Seizures. Coma. How is this treated? Treatment for this condition depends on the cause. Treatment may include: Getting fluids through an IV tube that is put into one of your veins. Taking medicines to fix the salt levels in your blood. If medicines are causing the problem, your medicines will need to be changed. Limiting how much water or fluid you take in, in some cases. Monitoring in the hospital to watch your symptoms. Follow these instructions at home:  Take over-the-counter and prescription medicines only as told by your doctor. Many medicines can make this condition worse. Talk with your doctor about any medicines that you are taking. Do not drink alcohol. Keep all follow-up visits. Contact a doctor if: You feel more like you may vomit. You feel more tired. Your headache gets worse. You feel more confused. You feel weaker. Your symptoms go away and then they come back. Get  help right away if: You have a seizure. You faint. You keep having watery poop. You keep vomiting. Summary Hyponatremia is when the amount of salt in your blood is too low. When salt levels are low, you can have swelling throughout the body. The swelling mostly affects the brain. Treatment depends on the cause. Treatment may include IV fluids and changing medicines. This information is not intended to replace advice given to you by your health care provider. Make sure you discuss any questions you have with your health care provider. Document Revised: 08/20/2020 Document Reviewed: 08/20/2020 Elsevier Patient Education  2024 [employer].

## 2023-12-23 NOTE — Assessment & Plan Note (Signed)
 Asymptomatic.  Last hemoglobin 11.3 We will repeat today Had normal WBC count

## 2023-12-23 NOTE — Assessment & Plan Note (Signed)
 No history of diabetes Last glucose 150 CT scan of abdomen and pelvis shows pancreatic atrophy Recommend hemoglobin A1c today

## 2023-12-23 NOTE — Telephone Encounter (Signed)
 Post ED Visit - Positive Culture Follow-up  Culture report reviewed by antimicrobial stewardship pharmacist: [facility] [x]  Vito Ralph, Pharm.D. []  Venetia Gully, Pharm.D., BCPS AQ-ID []  Garrel Crews, Pharm.D., BCPS []  Almarie Lunger, Pharm.D., BCPS []  [place], [address].D., BCPS, AAHIVP []  Rosaline Bihari, Pharm.D., BCPS, AAHIVP []  Vernell Meier, PharmD, BCPS []  Latanya Hint, PharmD, BCPS []  Donald Medley, PharmD, BCPS []  Rocky Bold, PharmD []  Dorothyann Alert, PharmD, BCPS []  Morene Babe, PharmD  [facility] []  Rosaline Edison, PharmD []  Romona Bliss, PharmD []  Dolphus Roller, PharmD []  Veva Seip, Rph []  Vernell Daunt) Leonce, PharmD []  Eva Allis, PharmD []  Rosaline Millet, PharmD []  Iantha Batch, PharmD []  Arvin Gauss, PharmD []  Wanda Hasting, PharmD []  Ronal Rav, PharmD []  Rocky Slade, PharmD []  Bard Jeans, PharmD   Positive urine culture Treated with Nitrofurantoin , organism sensitive to the same and no further patient follow-up is required at this time.  Miranda Thompson 12/23/2023, 9:51 AM

## 2023-12-23 NOTE — Progress Notes (Signed)
 Miranda Thompson 89 y.o.   Chief Complaint  Patient presents with   Follow-up    Ed wants pt to do a blood work repeat  CBC, Metabolic Panel concerns about enzymes and pt has also been having lower back pain as well     HISTORY OF PRESENT ILLNESS: This is a 89 y.o. female here for hospital discharge follow-up Patient of Dr. Lynwood Rush Patient was seen last Monday, 12/20/2023 complaining of abdominal pain. Workup was positive for UTI.  Urine culture positive for E. coli.  On Macrobid .  Working well.  E. coli sensitive to Macrobid  Today feels much better Blood work also showed hyponatremia and hypochloremia Unremarkable CT scan of abdomen and pelvis  HPI   Prior to Admission medications   Medication Sig Start Date End Date Taking? Authorizing Provider  albuterol  (PROVENTIL ) (2.5 MG/3ML) 0.083% nebulizer solution Take 3 mLs (2.5 mg total) by nebulization every 6 (six) hours as needed for wheezing or shortness of breath. 11/02/23   Meade Verdon RAMAN, MD  albuterol  (PROVENTIL ) (2.5 MG/3ML) 0.083% nebulizer solution Take 3 mLs (2.5 mg total) by nebulization every 6 (six) hours as needed for wheezing or shortness of breath. 11/02/23   Meade Verdon RAMAN, MD  albuterol  (VENTOLIN  HFA) 108 (90 Base) MCG/ACT inhaler TAKE 2 PUFFS BY MOUTH EVERY 6 HOURS AS NEEDED FOR WHEEZE OR SHORTNESS OF BREATH 02/09/23   Icard, Adine CROME, DO  amoxicillin  (AMOXIL ) 500 MG capsule  03/03/23   [provider]  ASPIRIN  81 PO Take 1 tablet by mouth daily.    [provider]  Calcium  Carb-Cholecalciferol  600-800 MG-UNIT TABS Take 1 tablet by mouth daily.    [provider]  cyclobenzaprine  (FLEXERIL ) 5 MG tablet Take 1 tablet (5 mg total) by mouth 3 (three) times daily as needed. 11/09/23   Rush Lynwood ORN, MD  diltiazem  (CARDIZEM  CD) 120 MG 24 hr capsule TAKE 1 CAPSULE BY MOUTH EVERY DAY 11/03/23   Okey Vina GAILS, MD  guaiFENesin  (MUCINEX ) 600 MG 12 hr tablet Take by mouth 2 (two) times daily.    [provider]  HYDROcodone -acetaminophen  (NORCO/VICODIN) 5-325 MG tablet Take 1 tablet by mouth every 6 (six) hours as needed. 11/09/23   Rush Lynwood ORN, MD  losartan  (COZAAR ) 50 MG tablet Take 1 tablet (50 mg total) by mouth daily. 11/02/23   Okey Vina GAILS, MD  LUMIGAN 0.01 % SOLN  04/15/18   [provider]  Multiple Vitamins-Minerals (MULTIVITAMIN & MINERAL PO) Take 1 tablet by mouth daily.    [provider]  nitrofurantoin , macrocrystal-monohydrate, (MACROBID ) 100 MG capsule Take 1 capsule (100 mg total) by mouth 2 (two) times daily. 12/20/23   Fondaw, Hamp RAMAN, PA  nitroGLYCERIN  (NITROSTAT ) 0.4 MG SL tablet PLACE 1 TABLET (0.4 MG TOTAL) UNDER THE TONGUE EVERY 5 (FIVE) MINUTES AS NEEDED FOR CHEST PAIN. PLEASE CALL [phone] TO MAKE APPOINTMENT WITH DR. ROSS FOR FUTURE REFILLS. 06/02/23   Okey Vina GAILS, MD  Respiratory Therapy Supplies (FLUTTER) DEVI 1 application by Does not apply route 3 (three) times daily. 06/22/18   Oley Bascom RAMAN, NP  rosuvastatin  (CRESTOR ) 5 MG tablet Take 1 tablet (5 mg total) by mouth daily. 12/18/22   Okey Vina GAILS, MD  Saccharomyces boulardii (FLORASTOR PO) Take 1 capsule by mouth daily.    [provider]  sodium chloride  0.9 % nebulizer solution Take 3 mLs by nebulization 2 (two) times daily as needed (cough/congestion). 11/02/23   Desai, Nikita S, MD  Tiotropium  Bromide-Olodaterol (STIOLTO RESPIMAT ) 2.5-2.5 MCG/ACT AERS Inhale 2 puffs into the lungs daily. 10/15/23   Kara Dorn NOVAK, MD  TURMERIC CURCUMIN PO Take by mouth.    [provider]  VITAMIN D  PO Take 4,000 Int'l Units by mouth.    [provider]  Zinc 30 MG CAPS  05/12/17   [provider]    Allergies  Allergen Reactions   Tramadol  Nausea Only    Patient Active Problem List   Diagnosis Date Noted   Pseudomonas aeruginosa colonization 04/29/2023   Lung nodules 04/29/2023   Wheezing 01/14/2023   Productive cough 01/10/2023   Suprapubic mass  10/22/2022   RUQ pain 10/22/2022   Dyspnea 10/22/2022   UTI (urinary tract infection) 10/22/2022   Aphthous stomatitis 04/02/2022   DOE (dyspnea on exertion) 01/02/2022   Cervicalgia 10/02/2021   Low back pain 09/15/2020   Aortic atherosclerosis 09/11/2020   Submandibular gland mass 03/20/2020   Hyponatremia 03/20/2020   Hyperglycemia 02/20/2019   Genetic testing 11/11/2018   Family history of uterine cancer    Family history of brain cancer    GERD (gastroesophageal reflux disease) 02/14/2018   Glaucoma 02/14/2018   Family history of colon cancer 02/14/2018   Encounter for well adult exam with abnormal findings 02/14/2018   Osteoporosis 02/14/2018   History of left breast cancer 01/13/2018   S/P knee replacement 03/23/2016   S/P shoulder replacement 08/02/2015   Palpitations 08/20/2014   Episode of hypertension 08/20/2014   COPD, moderate (HCC) 05/09/2014   Premature atrial contractions 11/08/2013   Cardiac arrhythmia 05/10/2013   Trochanteric bursitis of left hip 02/02/2012   Osteoarthritis of left hip 06/25/2011   Leg length inequality 06/25/2011   Left ovarian cyst 01/20/2011   Elevated lipids 11/04/2009   Acute cystitis 11/04/2009   LOW BACK PAIN SYNDROME 05/07/2008   Bronchiectasis (HCC) 03/18/2007   Hemoptysis 03/18/2007   PNEUMONIA 03/07/2007   Disorder of bone and cartilage 03/07/2007   BRONCHIECTASIS 03/04/2007   Osteoarthritis 03/04/2007    Past Medical History:  Diagnosis Date   Adenomatous colon polyp    Allergic rhinitis    Anemia    pt states no anemia in her past hx thst she is aware of    Atrial fibrillation (HCC)    goes in and out of this rhythm- takes Tenormin    Breast cancer, left breast (HCC) 1994 with lumpectomy   chemo,tamox,radiation   Bronchiectasis with acute exacerbation (HCC)    COPD (chronic obstructive pulmonary disease) (HCC)    Coronary artery disease    Diverticulosis of colon    DJD (degenerative joint disease)     Dysrhythmia    palpitations   Family history of brain cancer    Family history of colon cancer 02/14/2018   Family history of colon cancer    Family history of malignant neoplasm of gastrointestinal tract    Family history of melanoma    Family history of uterine cancer    Fibromyalgia    GERD (gastroesophageal reflux disease) 02/14/2018   Glaucoma    Hemoptysis    History of pneumonia    Hypercholesteremia    pt denies   Hypertension    Low back pain syndrome    Neuromuscular disorder (HCC)    hx fibromyalgia    Osteopenia    Pneumonia    hx   Pseudomonas infection    Rotator cuff syndrome of right shoulder    Spinal stenosis     Past Surgical History:  Procedure  Laterality Date   CATARACT EXTRACTION Right    COLONOSCOPY     left breast lumpectomy and LN dissection  06/1992   Dr. Ethyl   LUNG BIOPSY  1968   TSurg---Dr. Vivian   LUNG BIOPSY  2012   dr nadel   POLYPECTOMY     REVERSE SHOULDER ARTHROPLASTY Right 08/02/2015   Procedure: RIGHT REVERSE TOTAL SHOULDER ARTHROPLASTY;  Surgeon: Marcey Her, MD;  Location: [facility] OR;  Service: Orthopedics;  Laterality: Right;   REVERSE TOTAL SHOULDER ARTHROPLASTY Right 08/02/2015   TONSILLECTOMY     TOTAL KNEE ARTHROPLASTY Right 01/21/2015   Procedure: TOTAL RIGHT KNEE ARTHROPLASTY;  Surgeon: Donnice Car, MD;  Location: [facility] ORS;  Service: Orthopedics;  Laterality: Right;   TOTAL KNEE ARTHROPLASTY Left 03/23/2016   Procedure: LEFT TOTAL KNEE ARTHROPLASTY;  Surgeon: Donnice Car, MD;  Location: [facility] ORS;  Service: Orthopedics;  Laterality: Left;    Social History   Socioeconomic History   Marital status: Married    Spouse name: Ryan   Number of children: 5   Years of education: Not on file   Highest education level: Not on file  Occupational History   Occupation: Retired  Tobacco Use   Smoking status: Former    Current packs/day: 0.00    Types: Cigarettes    Quit date: 02/24/1967    Years since quitting: 56.8   Smokeless  tobacco: Never  Vaping Use   Vaping status: Never Used  Substance and Sexual Activity   Alcohol use: Yes    Comment: occasionally   Drug use: No   Sexual activity: Not Currently    Partners: Male    Birth control/protection: Post-menopausal    Comment: husband vasectomy, older than 16, less than 5  Other Topics Concern   Not on file  Social History Narrative   Lives with husband.   Social Drivers of Corporate Investment Banker Strain: Low Risk  (03/24/2023)   Overall Financial Resource Strain (CARDIA)    Difficulty of Paying Living Expenses: Not hard at all  Food Insecurity: No Food Insecurity (03/24/2023)   Hunger Vital Sign    Worried About Running Out of Food in the Last Year: Never true    Ran Out of Food in the Last Year: Never true  Transportation Needs: No Transportation Needs (03/24/2023)   PRAPARE - Administrator, Civil Service (Medical): No    Lack of Transportation (Non-Medical): No  Physical Activity: Sufficiently Active (03/24/2023)   Exercise Vital Sign    Days of Exercise per Week: 5 days    Minutes of Exercise per Session: 50 min  Stress: No Stress Concern Present (03/24/2023)   [employer] of Occupational Health - Occupational Stress Questionnaire    Feeling of Stress : Only a little  Social Connections: Moderately Isolated (03/24/2023)   Social Connection and Isolation Panel    Frequency of Communication with Friends and Family: More than three times a week    Frequency of Social Gatherings with Friends and Family: More than three times a week    Attends Religious Services: Never    Database Administrator or Organizations: No    Attends Banker Meetings: Never    Marital Status: Married  Catering Manager Violence: Not At Risk (03/24/2023)   Humiliation, Afraid, Rape, and Kick questionnaire    Fear of Current or Ex-Partner: No    Emotionally Abused: No    Physically Abused: No    Sexually Abused: No  Family History   Problem Relation Age of Onset   Heart attack Mother        Deceased age 32   Colon cancer Father 61       deceased age 29 from colon ca.   Hypertension Brother    Stroke Maternal Grandmother    Colon polyps Daughter    Uterine cancer Daughter 83   Melanoma Daughter 13   Brain cancer Maternal Uncle    Other Paternal Uncle 12       possible cancer   Melanoma Niece 74       d. 67   Esophageal cancer Neg Hx    Stomach cancer Neg Hx    Rectal cancer Neg Hx    Ovarian cancer Neg Hx    Prostate cancer Neg Hx    Breast cancer Neg Hx      Review of Systems  Constitutional: Negative.  Negative for chills and fever.  HENT: Negative.  Negative for congestion and sore throat.   Respiratory: Negative.  Negative for cough and shortness of breath.   Cardiovascular: Negative.  Negative for chest pain and palpitations.  Gastrointestinal:  Negative for nausea and vomiting.  Genitourinary: Negative.  Negative for dysuria and hematuria.  Skin: Negative.  Negative for rash.  Neurological: Negative.  Negative for dizziness and headaches.  All other systems reviewed and are negative.   Vitals:   12/23/23 1026  BP: (!) 160/80  Pulse: 75  Temp: (!) 97.5 F (36.4 C)  SpO2: 96%    Physical Exam Constitutional:      Appearance: Normal appearance.  HENT:     Head: Normocephalic.     Mouth/Throat:     Mouth: Mucous membranes are moist.     Pharynx: Oropharynx is clear.  Eyes:     Extraocular Movements: Extraocular movements intact.     Pupils: Pupils are equal, round, and reactive to light.  Cardiovascular:     Rate and Rhythm: Normal rate and regular rhythm.     Pulses: Normal pulses.     Heart sounds: Normal heart sounds.  Pulmonary:     Effort: Pulmonary effort is normal.     Breath sounds: Normal breath sounds.  Abdominal:     Palpations: Abdomen is soft.     Tenderness: There is no abdominal tenderness.  Musculoskeletal:     Cervical back: No tenderness.  Lymphadenopathy:      Cervical: No cervical adenopathy.  Skin:    General: Skin is warm and dry.     Capillary Refill: Capillary refill takes less than 2 seconds.  Neurological:     General: No focal deficit present.     Mental Status: She is alert and oriented to person, place, and time.  Psychiatric:        Mood and Affect: Mood normal.        Behavior: Behavior normal.      ASSESSMENT & PLAN: A total of 40 minutes was spent with the patient and counseling/coordination of care regarding preparing for this visit, review of most recent office visit notes, review of most recent emergency department visit notes, review of multiple chronic medical conditions and their management, review of all medications, review of most recent bloodwork results, review of health maintenance items, education on nutrition, prognosis, documentation, and need for follow up.   Problem List Items Addressed This Visit       Endocrine   Hyperglycemia   No history of diabetes Last glucose 150 CT scan of  abdomen and pelvis shows pancreatic atrophy Recommend hemoglobin A1c today      Relevant Orders   Hemoglobin A1c     Genitourinary   E. coli UTI (urinary tract infection) - Primary   Clinically stable and improving. Urine culture positive for E. coli sensitive to Macrobid  Continue Macrobid  twice a day Repeat urinalysis and urine culture today Advised to stay well-hydrated and follow-up with PCP within the next couple weeks      Relevant Orders   Urinalysis   Urine Culture     Other   Mild anemia   Asymptomatic.  Last hemoglobin 11.3 We will repeat today Had normal WBC count      Relevant Orders   CBC with Differential/Platelet   Hyponatremia   History of chronic hyponatremia Last sodium 127 Asymptomatic Will repeat today Management discussed      Relevant Orders   Comprehensive metabolic panel with GFR   Other Visit Diagnoses       Hospital discharge follow-up          Patient Instructions   Hyponatremia Hyponatremia is when the amount of salt (sodium) in your blood is too low. When salt levels are low, your body cells may take in extra water. This can cause swelling. The swelling often affects the brain. What are the causes? Certain medical problems or conditions. Vomiting a lot. Having watery poop (diarrhea) often. Sweating too much. Taking certain medicines or using illegal drugs. Fluids given through an IV tube. What increases the risk? Having heart, kidney, or liver failure. Having a medical condition that causes you to have watery poop a lot. Doing very hard exercises. Taking medicines that affect the amount of salt that is in your blood. What are the signs or symptoms? Symptoms of this condition include: Headache. Feeling like you may vomit (nausea). Vomiting. Being very tired. Muscle weakness and cramps. Not wanting to eat as much as normal. Feeling weak or dizzy. Very bad symptoms of this condition include: Confusion. Feeling restless. Having a fast heart rate. Fainting. Seizures. Coma. How is this treated? Treatment for this condition depends on the cause. Treatment may include: Getting fluids through an IV tube that is put into one of your veins. Taking medicines to fix the salt levels in your blood. If medicines are causing the problem, your medicines will need to be changed. Limiting how much water or fluid you take in, in some cases. Monitoring in the hospital to watch your symptoms. Follow these instructions at home:  Take over-the-counter and prescription medicines only as told by your doctor. Many medicines can make this condition worse. Talk with your doctor about any medicines that you are taking. Do not drink alcohol. Keep all follow-up visits. Contact a doctor if: You feel more like you may vomit. You feel more tired. Your headache gets worse. You feel more confused. You feel weaker. Your symptoms go away and then they come back. Get  help right away if: You have a seizure. You faint. You keep having watery poop. You keep vomiting. Summary Hyponatremia is when the amount of salt in your blood is too low. When salt levels are low, you can have swelling throughout the body. The swelling mostly affects the brain. Treatment depends on the cause. Treatment may include IV fluids and changing medicines. This information is not intended to replace advice given to you by your health care provider. Make sure you discuss any questions you have with your health care provider. Document Revised: 08/20/2020 Document Reviewed:  08/20/2020 Elsevier Patient Education  2024 Elsevier Inc.    Emil Schaumann, MD [facility]

## 2023-12-24 LAB — URINE CULTURE

## 2023-12-27 ENCOUNTER — Encounter: Payer: Self-pay | Admitting: Internal Medicine

## 2023-12-27 DIAGNOSIS — R300 Dysuria: Secondary | ICD-10-CM

## 2023-12-28 ENCOUNTER — Encounter (INDEPENDENT_AMBULATORY_CARE_PROVIDER_SITE_OTHER): Payer: Self-pay | Admitting: Otolaryngology

## 2023-12-28 ENCOUNTER — Ambulatory Visit (INDEPENDENT_AMBULATORY_CARE_PROVIDER_SITE_OTHER): Payer: PRIVATE HEALTH INSURANCE | Admitting: Otolaryngology

## 2023-12-28 VITALS — BP 136/67 | HR 85 | Temp 98.0°F | Ht 63.0 in | Wt 125.0 lb

## 2023-12-28 DIAGNOSIS — H401132 Primary open-angle glaucoma, bilateral, moderate stage: Secondary | ICD-10-CM | POA: Diagnosis not present

## 2023-12-28 DIAGNOSIS — R042 Hemoptysis: Secondary | ICD-10-CM | POA: Diagnosis not present

## 2023-12-28 NOTE — Progress Notes (Signed)
 Reason for Consult:hemptysis Referring Physician: Dr Norleen Neville Miranda Thompson is an 89 y.o. female.  HPI: Here for a history of hemoptysis.  Is been going on for years but now it has been slightly worse.  She had one of the worst episodes in a long time a few weeks ago.  She definitely has pulmonary issues with bronchiectasis and COPD.  Her CT scan does show a lot of inflammatory changes.  Prior to the onset of the severe hemoptysis she had a gurgling in her chest so she is convinced it came from her chest.  She has no change in the voice.  She occasionally will have some dysphagia and it is known she has a narrowing of the esophagus.  She has no dyne aphasia.  No sore throat.  She has never had any blood from the nose or suspected that the nose was the source for any bleeding.  No nasal obstruction.  She does not have any significant recent weight loss.  She is followed by pulmonology.  Past Medical History:  Diagnosis Date   Adenomatous colon polyp    Allergic rhinitis    Anemia    pt states no anemia in her past hx thst she is aware of    Atrial fibrillation (HCC)    goes in and out of this rhythm- takes Tenormin    Breast cancer, left breast (HCC) 1994 with lumpectomy   chemo,tamox,radiation   Bronchiectasis with acute exacerbation (HCC)    COPD (chronic obstructive pulmonary disease) (HCC)    Coronary artery disease    Diverticulosis of colon    DJD (degenerative joint disease)    Dysrhythmia    palpitations   Family history of brain cancer    Family history of colon cancer 02/14/2018   Family history of colon cancer    Family history of malignant neoplasm of gastrointestinal tract    Family history of melanoma    Family history of uterine cancer    Fibromyalgia    GERD (gastroesophageal reflux disease) 02/14/2018   Glaucoma    Hemoptysis    History of pneumonia    Hypercholesteremia    pt denies   Hypertension    Low back pain syndrome    Neuromuscular disorder (HCC)    hx  fibromyalgia    Osteopenia    Pneumonia    hx   Pseudomonas infection    Rotator cuff syndrome of right shoulder    Spinal stenosis     Past Surgical History:  Procedure Laterality Date   CATARACT EXTRACTION Right    COLONOSCOPY     left breast lumpectomy and LN dissection  06/1992   Dr. Ethyl   LUNG BIOPSY  1968   TSurg---Dr. Vivian   LUNG BIOPSY  2012   dr christi   POLYPECTOMY     REVERSE SHOULDER ARTHROPLASTY Right 08/02/2015   Procedure: RIGHT REVERSE TOTAL SHOULDER ARTHROPLASTY;  Surgeon: Marcey Her, MD;  Location: [facility] OR;  Service: Orthopedics;  Laterality: Right;   REVERSE TOTAL SHOULDER ARTHROPLASTY Right 08/02/2015   TONSILLECTOMY     TOTAL KNEE ARTHROPLASTY Right 01/21/2015   Procedure: TOTAL RIGHT KNEE ARTHROPLASTY;  Surgeon: Donnice Car, MD;  Location: [facility] ORS;  Service: Orthopedics;  Laterality: Right;   TOTAL KNEE ARTHROPLASTY Left 03/23/2016   Procedure: LEFT TOTAL KNEE ARTHROPLASTY;  Surgeon: Donnice Car, MD;  Location: [facility] OR[facility];  Service: Orthopedics;  Laterality: Left;    Family History  Problem Relation Age of Onset   Heart attack  Mother        Deceased age 77   Colon cancer Father 47       deceased age 79 from colon ca.   Hypertension Brother    Stroke Maternal Grandmother    Colon polyps Daughter    Uterine cancer Daughter 79   Melanoma Daughter 6   Brain cancer Maternal Uncle    Other Paternal Uncle 12       possible cancer   Melanoma Niece 79       d. 80   Esophageal cancer Neg Hx    Stomach cancer Neg Hx    Rectal cancer Neg Hx    Ovarian cancer Neg Hx    Prostate cancer Neg Hx    Breast cancer Neg Hx     Social History:  reports that she quit smoking about 56 years ago. Her smoking use included cigarettes. She has never used smokeless tobacco. She reports current alcohol use. She reports that she does not use drugs.  Allergies:  Allergies  Allergen Reactions   Tramadol  Nausea Only    Medications: I have reviewed the patient's  current medications.  No results found for this or any previous visit (from the past 48 hours).  No results found.  ROS Last menstrual period 02/23/1985. Physical Exam Constitutional:      Appearance: Normal appearance.  HENT:     Head: Normocephalic and atraumatic.     Right Ear: Tympanic membrane is without lesions and middle ear aerated, ear canal and external ear normal.     Left Ear: Tympanic membrane is without lesions and middle ear aerated, ear canal and external ear normal.     Nose: Nose normal. Turbinates with mild hypertrophy, No significant swelling or masses.     Oral cavity/oropharynx: Mucous membranes are moist. No lesions or masses    Larynx: normal voice. Mirror attempted without success    Eyes:     Extraocular Movements: Extraocular movements intact.     Conjunctiva/sclera: Conjunctivae normal.     Pupils: Pupils are equal, round, and reactive to light.  Cardiovascular:     Rate and Rhythm: Normal rate.  Pulmonary:     Effort: Pulmonary effort is normal.  Musculoskeletal:     Cervical back: Normal range of motion and neck supple. No rigidity.  Lymphadenopathy:     Cervical: No cervical adenopathy or masses.salivary glands without lesions. .  Neurological:     Mental Status: He is alert. CN 2-12 intact. No nystagmus   Flexible fibroptic laryngoscopy  Patient was informed of risks, benefits, and options. All questions answered. Consent obtained.   The scope was passed through the nose and tracked into the nasopharynx. The nasopharynx without lesions or masses. The scope was positioned over the base of tongue and epiglottis. There was no obvious lesions or significant swelling and any of the laryngeal or pharyngeal structures. The vocal cords move well. The subglottis has minimal visualization but no lesions identified. The scope was removed without difficulty and patient tolerated well.     Assessment/Plan: Hemoptysis-this clearly seems to be a pulmonary  source and the patient believes it to be the case as well.  She was sent here to be sure there was no other sources and I do not see any evidence of a source of bleeding from the head neck area.  The nose looks clear and no evidence of previous bleeding sites or lesions.  Her vocal cords look normal as well as the rest of the  larynx and hypopharynx.  She will follow-up with her pulmonologist.  Norleen Notice 12/28/2023, 10:30 AM

## 2023-12-29 ENCOUNTER — Ambulatory Visit (INDEPENDENT_AMBULATORY_CARE_PROVIDER_SITE_OTHER)

## 2023-12-29 ENCOUNTER — Encounter (HOSPITAL_BASED_OUTPATIENT_CLINIC_OR_DEPARTMENT_OTHER): Payer: Self-pay

## 2023-12-29 VITALS — BP 153/69 | HR 82 | Ht 63.0 in | Wt 128.0 lb

## 2023-12-29 DIAGNOSIS — R042 Hemoptysis: Secondary | ICD-10-CM | POA: Diagnosis not present

## 2023-12-29 DIAGNOSIS — J449 Chronic obstructive pulmonary disease, unspecified: Secondary | ICD-10-CM

## 2023-12-29 DIAGNOSIS — J479 Bronchiectasis, uncomplicated: Secondary | ICD-10-CM

## 2023-12-29 DIAGNOSIS — Z2239 Carrier of other specified bacterial diseases: Secondary | ICD-10-CM | POA: Diagnosis not present

## 2023-12-29 NOTE — Assessment & Plan Note (Signed)
 Assessment and Plan Assessment & Plan Chronic pseudomonal bronchiectasis with prior hemoptysis Chronic pseudomonal bronchiectasis with resolved hemoptysis. Sputum culture confirmed Pseudomonas colonization. August CT scan showed mucus impaction, possible mycobacterial infection not confirmed by cultures. Bronchoscopy considered high risk with low benefit. - Continue nebulizer and flutter valve regimen. - Use albuterol  nebulizer or puffer as needed for dyspnea. - Repeat CT scan in February or March. Patient wishes to proceed with follow up CT with the caveat that she would not like to pursue further invasive procedures regardless of the outcome of the testing. - Avoid bronchoscopy due to high risk and low benefit.  Chronic abdominal and pelvic pain, etiology under evaluation Chronic abdominal and pelvic pain with unclear etiology. Recent ER visit for UTI and severe pain. Pancreatic atrophy noted, but normal glucose and hemoglobin A1c reduce diabetes likelihood. Awaiting GI consultation. - Await GI consultation for potential endoscopy and colonoscopy. - Monitor symptoms and consider risks and benefits of invasive procedures.

## 2023-12-29 NOTE — Progress Notes (Signed)
 @Patient  ID: Miranda Thompson, female    DOB: [birth year], 89 y.o.   MRN: [id number]  Chief Complaint  Patient presents with   Follow-up    Hemoptysis     Referring provider: Norleen Lynwood ORN, MD  HPI: Discussed the use of AI scribe software for clinical note transcription with the patient, who gave verbal consent to proceed.  History of Present Illness Miranda Thompson is an 89 year old female with chronic lung disease who presents with a history of coughing up blood.  She recently experienced an episode of hemoptysis but has not had any further episodes since her last visit. She has difficulty producing sputum and has not been engaging in physical exercise. She regularly uses her nebulizer, including a sodium nebulizer in the morning and an Albuterol  one at night, followed by a flutter device to aid in sputum clearance.  She has a history of multilevel disc degeneration and reports persistent abdominal discomfort, particularly in the pelvic region. She recently visited the ER due to severe abdominal pain and was diagnosed with another urinary tract infection. A CT scan of the abdomen performed in the ER showed a diffusely atrophic pancreas but no focal lesions. During the ER visit, her blood glucose levels were elevated but normalized on repeat testing, and her hemoglobin A1c was normal.  A recent sputum culture grew pseudomonas, which has been a consistent finding in her past cultures. Her current medication regimen includes Stiolto once a day, albuterol  as needed, and saline nebulizers. She uses a flutter device to aid in sputum production. No fever or chills, but she reports fatigue and sluggishness. No recent confusion.   Last OV 12/15/2023: Miranda Thompson is an 89 year old female with bronchiectasis and Pseudomonas colonization who presents with her daughter for a c/o hemoptysis.   She experienced hemoptysis at midnight, coughing up bright red blood that filled a Kleenex, without  associated phlegm. Initially, the blood was dark but became bright red. Over the past month, she has had occasional hemoptysis, typically with sputum, but this recent episode involved only blood. No recent changes in medication or activities that could have precipitated the bleeding. No recent epistaxis or nasal polyps, although she had a minor nasal injury from a plastic container. She experiences a gurgling sensation in her throat prior to hemoptysis, without significant coughing.   She has a history of bronchiectasis and Pseudomonas colonization for approximately 30 years, with periodic follow-ups with pulmonology and infectious disease specialists. She had a CT scan of her chest in August. Her pulmonary hygiene routine includes albuterol  nebulizers, saline nebulizers, and a flutter device, but she does not use a chest vest. She does not require supplemental oxygen and maintains good oxygen levels.   Her current medications include low-dose aspirin , losartan , diltiazem , and a generic form of Crestor . Her losartan  dosage was recently increased from 12.5 mg to 50 mg. She has a history of hypertension and possible atrial fibrillation, for which she takes aspirin .   She reports chronic chest tightness and abdominal pain, with a history of Pseudomonas in her urine during UTIs. She experiences poor balance but does not feel faint.   She has a sputum cup with her today which has a small amount of dark blood in it.    TEST/EVENTS : Sputum culture: ISOLATE 1: Pseudomonas aeruginosa Abnormal   Comment: Moderate growth of Pseudomonas aeruginosa  Resulting Agency [facility]     Susceptibility   Pseudomonas aeruginosa    RESPIRATORY/SPUTUM CULT  NEGATIVE 1    AMIKACIN 8.0 Sensitive    CEFEPIME 8.0 Sensitive    CEFTAZIDIME 2.0 Sensitive    CIPROFLOXACIN  >=4.0 Resistant    GENTAMICIN 4.0 Sensitive    IMIPENEM >=16.0 Resistant    LEVOFLOXACIN  >=8.0 Resistant    MEROPENEM 1.0  Intermediate    PIP/TAZO 8.0 Sensitive 1        Allergies  Allergen Reactions   Tramadol  Nausea Only    Immunization History  Administered Date(s) Administered   Fluad Quad(high Dose 65+) 11/08/2019, 11/14/2020, 11/19/2021   INFLUENZA, HIGH DOSE SEASONAL PF 11/19/2015, 12/02/2016, 12/17/2017, 11/22/2018   Influenza Split 12/03/2011   Influenza Whole 11/30/2007, 11/30/2008, 12/03/2009, 11/24/2010   Influenza,inj,Quad PF,6+ Mos 11/10/2012, 11/08/2013, 11/06/2014   Influenza-Unspecified 12/02/2016, 11/22/2018, 11/14/2021, 12/15/2022   PFIZER Comirnaty(Gray Top)Covid-19 Tri-Sucrose Vaccine 05/27/2020   PFIZER(Purple Top)SARS-COV-2 Vaccination 03/14/2019, 04/04/2019, 11/18/2019   Pfizer(Comirnaty)Fall Seasonal Vaccine 12 years and older 11/14/2021   Pneumococcal Conjugate-13 05/09/2014   Pneumococcal Polysaccharide-23 11/07/2007   Tdap 05/09/2014   Unspecified SARS-COV-2 Vaccination 12/15/2022   Zoster Recombinant(Shingrix) 04/10/2018, 01/08/2019   Zoster, Live 12/11/2009    Past Medical History:  Diagnosis Date   Adenomatous colon polyp    Allergic rhinitis    Anemia    pt states no anemia in her past hx thst she is aware of    Atrial fibrillation (HCC)    goes in and out of this rhythm- takes Tenormin    Breast cancer, left breast (HCC) 1994 with lumpectomy   chemo,tamox,radiation   Bronchiectasis with acute exacerbation (HCC)    COPD (chronic obstructive pulmonary disease) (HCC)    Coronary artery disease    Diverticulosis of colon    DJD (degenerative joint disease)    Dysrhythmia    palpitations   Family history of brain cancer    Family history of colon cancer 02/14/2018   Family history of colon cancer    Family history of malignant neoplasm of gastrointestinal tract    Family history of melanoma    Family history of uterine cancer    Fibromyalgia    GERD (gastroesophageal reflux disease) 02/14/2018   Glaucoma    Hemoptysis    History of pneumonia     Hypercholesteremia    pt denies   Hypertension    Low back pain syndrome    Neuromuscular disorder (HCC)    hx fibromyalgia    Osteopenia    Pneumonia    hx   Pseudomonas infection    Rotator cuff syndrome of right shoulder    Spinal stenosis     Tobacco History: Social History   Tobacco Use  Smoking Status Former   Current packs/day: 0.00   Types: Cigarettes   Quit date: 02/24/1967   Years since quitting: 56.8  Smokeless Tobacco Never   Counseling given: Not Answered   Outpatient Medications Prior to Visit  Medication Sig Dispense Refill   albuterol  (PROVENTIL ) (2.5 MG/3ML) 0.083% nebulizer solution Take 3 mLs (2.5 mg total) by nebulization every 6 (six) hours as needed for wheezing or shortness of breath. 75 mL 12   albuterol  (VENTOLIN  HFA) 108 (90 Base) MCG/ACT inhaler TAKE 2 PUFFS BY MOUTH EVERY 6 HOURS AS NEEDED FOR WHEEZE OR SHORTNESS OF BREATH 8.5 each 6   amoxicillin  (AMOXIL ) 500 MG capsule      ASPIRIN  81 PO Take 1 tablet by mouth daily.     Calcium  Carb-Cholecalciferol  600-800 MG-UNIT TABS Take 1 tablet by mouth daily.     cyclobenzaprine  (FLEXERIL ) 5 MG tablet  Take 1 tablet (5 mg total) by mouth 3 (three) times daily as needed. 40 tablet 1   diltiazem  (CARDIZEM  CD) 120 MG 24 hr capsule TAKE 1 CAPSULE BY MOUTH EVERY DAY 90 capsule 3   guaiFENesin  (MUCINEX ) 600 MG 12 hr tablet Take by mouth 2 (two) times daily.     HYDROcodone -acetaminophen  (NORCO/VICODIN) 5-325 MG tablet Take 1 tablet by mouth every 6 (six) hours as needed. 30 tablet 0   losartan  (COZAAR ) 50 MG tablet Take 1 tablet (50 mg total) by mouth daily. 90 tablet 3   LUMIGAN 0.01 % SOLN      Multiple Vitamins-Minerals (MULTIVITAMIN & MINERAL PO) Take 1 tablet by mouth daily.     nitroGLYCERIN  (NITROSTAT ) 0.4 MG SL tablet PLACE 1 TABLET (0.4 MG TOTAL) UNDER THE TONGUE EVERY 5 (FIVE) MINUTES AS NEEDED FOR CHEST PAIN. PLEASE CALL [phone] TO MAKE APPOINTMENT WITH DR. ROSS FOR FUTURE REFILLS. 25 tablet 5    Respiratory Therapy Supplies (FLUTTER) DEVI 1 application by Does not apply route 3 (three) times daily. 1 each 0   rosuvastatin  (CRESTOR ) 5 MG tablet Take 1 tablet (5 mg total) by mouth daily. 90 tablet 3   Saccharomyces boulardii (FLORASTOR PO) Take 1 capsule by mouth daily.     sodium chloride  0.9 % nebulizer solution Take 3 mLs by nebulization 2 (two) times daily as needed (cough/congestion). 90 mL 12   Tiotropium Bromide-Olodaterol (STIOLTO RESPIMAT ) 2.5-2.5 MCG/ACT AERS Inhale 2 puffs into the lungs daily. 12 g 3   TURMERIC CURCUMIN PO Take by mouth.     VITAMIN D  PO Take 4,000 Int'l Units by mouth.     Zinc 30 MG CAPS      albuterol  (PROVENTIL ) (2.5 MG/3ML) 0.083% nebulizer solution Take 3 mLs (2.5 mg total) by nebulization every 6 (six) hours as needed for wheezing or shortness of breath. 75 mL 12   nitrofurantoin , macrocrystal-monohydrate, (MACROBID ) 100 MG capsule Take 1 capsule (100 mg total) by mouth 2 (two) times daily. 10 capsule 0   No facility-administered medications prior to visit.     Review of Systems: as per hpi  Constitutional:   No  weight loss, night sweats,  Fevers, chills, fatigue, or  lassitude.  HEENT:   No headaches,  Difficulty swallowing,  Tooth/dental problems, or  Sore throat,                No sneezing, itching, ear ache, nasal congestion, post nasal drip,   CV:  No chest pain,  Orthopnea, PND, swelling in lower extremities, anasarca, dizziness, palpitations, syncope.   GI  No heartburn, indigestion, abdominal pain, nausea, vomiting, diarrhea, change in bowel habits, loss of appetite, bloody stools.   Resp: No shortness of breath with exertion or at rest.  No excess mucus, no productive cough,  No non-productive cough,  No coughing up of blood.  No change in color of mucus.  No wheezing.  No chest wall deformity  Skin: no rash or lesions.  GU: no dysuria, change in color of urine, no urgency or frequency.  No flank pain, no hematuria   MS:  No joint  pain or swelling.  No decreased range of motion.  No back pain.    Physical Exam  BP (!) 153/69   Pulse 82   Ht 5' 3 (1.6 m)   Wt 128 lb (58.1 kg)   LMP 02/23/1985   BMI 22.67 kg/m   GEN: A/Ox3; pleasant , NAD, well nourished.  Speaks in full sentences.  HEENT:  [place]/AT,  EACs-clear, TMs-wnl, NOSE-clear, THROAT-clear, no lesions, no postnasal drip or exudate noted.   NECK:  Supple w/ fair ROM; no JVD; normal carotid impulses w/o bruits; no thyromegaly or nodules palpated; no lymphadenopathy.    RESP  Clear  P & A; w/o, wheezes/ rales/ or rhonchi. no accessory muscle use, no dullness to percussion  CARD:  RRR, no m/r/g, no peripheral edema, pulses intact, no cyanosis or clubbing.  GI:   Soft & nt; nml bowel sounds; no organomegaly or masses detected.   Musco: Warm bil, no deformities or joint swelling noted.   Neuro: alert, no focal deficits noted.    Skin: Warm, no lesions or rashes    Lab Results:  CBC    Component Value Date/Time   WBC 4.7 12/23/2023 1112   RBC 3.69 (L) 12/23/2023 1112   HGB 12.3 12/23/2023 1112   HGB 12.2 12/15/2023 1449   HCT 35.6 (L) 12/23/2023 1112   HCT 35.1 12/15/2023 1449   PLT 283.0 12/23/2023 1112   PLT 257 12/15/2023 1449   MCV 96.6 12/23/2023 1112   MCV 98 (H) 12/15/2023 1449   MCH 33.6 12/20/2023 1628   MCHC 34.5 12/23/2023 1112   RDW 12.3 12/23/2023 1112   RDW 11.4 (L) 12/15/2023 1449   LYMPHSABS 0.8 12/23/2023 1112   LYMPHSABS 1.0 12/15/2023 1449   MONOABS 0.4 12/23/2023 1112   EOSABS 0.3 12/23/2023 1112   EOSABS 0.2 12/15/2023 1449   BASOSABS 0.1 12/23/2023 1112   BASOSABS 0.1 12/15/2023 1449    BMET    Component Value Date/Time   NA 127 (L) 12/23/2023 1112   NA 131 (L) 11/04/2023 1307   K 4.6 12/23/2023 1112   CL 93 (L) 12/23/2023 1112   CO2 27 12/23/2023 1112   GLUCOSE 95 12/23/2023 1112   BUN 17 12/23/2023 1112   BUN 13 11/04/2023 1307   CREATININE 0.58 12/23/2023 1112   CALCIUM  9.1 12/23/2023 1112    GFRNONAA >60 12/20/2023 1628   GFRAA >60 10/11/2016 1131    BNP No results found for: BNP  ProBNP No results found for: PROBNP  Imaging: CT ABDOMEN PELVIS W CONTRAST Result Date: 12/20/2023 EXAM: CT ABDOMEN AND PELVIS WITH CONTRAST 12/20/2023 06:37:47 PM TECHNIQUE: CT of the abdomen and pelvis was performed with the administration of 100 mL of iohexol  (OMNIPAQUE ) 300 MG/ML solution. Multiplanar reformatted images are provided for review. Automated exposure control, iterative reconstruction, and/or weight-based adjustment of the mA/kV was utilized to reduce the radiation dose to as low as reasonably achievable. COMPARISON: CT abdomen and pelvis 10/05/2023.MRI pelvis 05/04/2023 CLINICAL HISTORY: LLQ abdominal pain; suprapubic and lower abd pain. Pt c/o lower abd pain nagging for a couple weeks/ since August, worsening in the last 2 weeks/ days. Associated lower R back pain, denies urinary symptoms. FINDINGS: LOWER CHEST: Right base pulmonary scarring similar to prior. LIVER: The liver is unremarkable. GALLBLADDER AND BILE DUCTS: Gallbladder is unremarkable. No biliary ductal dilatation. SPLEEN: No acute abnormality. PANCREAS: Diffusely atrophic. No focal lesion. Otherwise normal pancreatic contour. No surrounding inflammatory changes. No main pancreatic ductal dilatation. ADRENAL GLANDS: No acute abnormality. KIDNEYS, URETERS AND BLADDER: No stones in the kidneys or ureters. No hydronephrosis. No perinephric or periureteral stranding. No filling defects of the partially visualized collecting systems on delayed imaging. Urinary bladder is unremarkable. GI AND BOWEL: Stomach demonstrates no acute abnormality. No small or large bowel wall thickening. Colonic diverticulosis with no acute diverticulitis. The appendix is not definitely identified with no inflammatory changes  in the right lower quadrant to suggest acute appendicitis. PERITONEUM AND RETROPERITONEUM: No ascites. No free air. VASCULATURE:  Severe atherosclerotic plaque of the aorta and its main branches. No aortic aneurysm. LYMPH NODES: No lymphadenopathy. REPRODUCTIVE ORGANS: The uterus is unremarkable. There is a grossly stable 4 cm simple fluid-appearing cystic lesion within the left adnexa. No right adnexal mass. BONES AND SOFT TISSUES: Multilevel severe degenerative changes of the spine. Levoscoliosis of the lumbar spine. No suspicious lytic or blastic osseous lesions. No focal soft tissue abnormality. IMPRESSION: 1. No acute findings in the abdomen or pelvis. 2. Colonic diverticulosis without diverticulitis. 3. Left adnexal simple-appearing cyst measuring 4 cm. Given MRI pelvis 05/04/2023 recommendations, recommend pelvic ultrasound for further evaluation. 4. Levoscoliosis of the lumbar spine with associated severe degenerative changes. 5. Severe atherosclerotic plaque. Electronically signed by: Morgane Naveau MD 12/20/2023 07:19 PM EDT RP Workstation: [id number]   DG Chest 2 View Result Date: 12/15/2023 CLINICAL DATA:  Hemoptysis EXAM: CHEST - 2 VIEW COMPARISON:  03/12/2023 FINDINGS: The heart size and mediastinal contours are within normal limits. Unchanged heterogeneous nodularity and fibrotic bronchiectasis throughout both lungs. No acute appearing airspace opacity. Right shoulder arthroplasty. Disc degenerative disease of the thoracic spine. IMPRESSION: Unchanged heterogeneous nodularity and fibrotic bronchiectasis throughout both lungs. No acute appearing airspace opacity. Consider CT to more clearly evaluate for acute airspace disease. Electronically Signed   By: Marolyn JONETTA Jaksch M.D.   On: 12/15/2023 20:43    Administration History     None           No data to display          No results found for: NITRICOXIDE   Assessment & Plan:   Assessment & Plan Bronchiectasis without complication (HCC)  COPD, moderate (HCC)  Hemoptysis  Pseudomonas aeruginosa colonization Assessment and Plan Assessment &  Plan Chronic pseudomonal bronchiectasis with prior hemoptysis Chronic pseudomonal bronchiectasis with resolved hemoptysis. Sputum culture confirmed Pseudomonas colonization. August CT scan showed mucus impaction, possible mycobacterial infection not confirmed by cultures. Bronchoscopy considered high risk with low benefit. - Continue nebulizer and flutter valve regimen. - Use albuterol  nebulizer or puffer as needed for dyspnea. - Repeat CT scan in February or March. Patient wishes to proceed with follow up CT with the caveat that she would not like to pursue further invasive procedures regardless of the outcome of the testing. - Avoid bronchoscopy due to high risk and low benefit.  Chronic abdominal and pelvic pain, etiology under evaluation Chronic abdominal and pelvic pain with unclear etiology. Recent ER visit for UTI and severe pain. Pancreatic atrophy noted, but normal glucose and hemoglobin A1c reduce diabetes likelihood. Awaiting GI consultation. - Await GI consultation for potential endoscopy and colonoscopy. - Monitor symptoms and consider risks and benefits of invasive procedures.   Return in about 4 months (around 04/27/2024) for CT results.  Candis Dandy, PA-C 12/29/2023

## 2023-12-29 NOTE — Patient Instructions (Signed)
 Continue Stiolto daily.  Continue Albuterol  nebs and flutter device twice daily.  Follow up CT scan in February; f/u in office after this is completed.

## 2023-12-30 ENCOUNTER — Other Ambulatory Visit

## 2023-12-30 DIAGNOSIS — R300 Dysuria: Secondary | ICD-10-CM | POA: Diagnosis not present

## 2024-01-02 ENCOUNTER — Ambulatory Visit: Payer: Self-pay | Admitting: Internal Medicine

## 2024-01-02 ENCOUNTER — Other Ambulatory Visit: Payer: Self-pay | Admitting: Internal Medicine

## 2024-01-02 LAB — URINE CULTURE

## 2024-01-02 MED ORDER — CIPROFLOXACIN HCL 500 MG PO TABS: 500.0000 mg | ORAL_TABLET | Freq: Two times a day (BID) | ORAL | 0 refills | Status: AC

## 2024-01-05 NOTE — Telephone Encounter (Signed)
 [employer] form completed and signed by Dr. Meade, faxed to [phone].  No answer.  Will attempt again tomorrow.  Copy made and put in A pod in Desai's cabinet, original sent to scan.

## 2024-01-16 ENCOUNTER — Other Ambulatory Visit: Payer: Self-pay | Admitting: Internal Medicine

## 2024-01-24 ENCOUNTER — Encounter: Payer: Self-pay | Admitting: Internal Medicine

## 2024-01-24 ENCOUNTER — Ambulatory Visit: Admitting: Pulmonary Disease

## 2024-01-24 ENCOUNTER — Encounter: Payer: Self-pay | Admitting: Pulmonary Disease

## 2024-01-24 VITALS — BP 148/64 | HR 84 | Ht 63.0 in | Wt 128.0 lb

## 2024-01-24 DIAGNOSIS — J479 Bronchiectasis, uncomplicated: Secondary | ICD-10-CM | POA: Diagnosis not present

## 2024-01-24 DIAGNOSIS — M5441 Lumbago with sciatica, right side: Secondary | ICD-10-CM

## 2024-01-24 DIAGNOSIS — Z2239 Carrier of other specified bacterial diseases: Secondary | ICD-10-CM

## 2024-01-24 NOTE — Patient Instructions (Addendum)
 Use saline nebulizer treatment twice daily followed by flutter valve with 5-7 good blows  Look at the saline solutions and determine if you have 3% saline or 0.9% saline  Recommend using the 0.9% saline nebs unless you are having increase in your sputum production then you can change to the 3% concentration  Use stiolto inhaler 2 puffs daily  We will check with our chest vest rep and whether it is safe to use with your degenerative back disease  Follow up in 4 months, call sooner if needed

## 2024-01-24 NOTE — Assessment & Plan Note (Signed)
 Miranda Thompson

## 2024-01-24 NOTE — Progress Notes (Signed)
 Established Patient Pulmonology Office Visit   Subjective:  Patient ID: Miranda Thompson, female    DOB: [birth year]  MRN: [id number]  CC:  Chief Complaint  Patient presents with   Medical Management of Chronic Issues    Pt states UTI (?) x 2weeks , back issues ( Vest ? )     Discussed the use of AI scribe software for clinical note transcription with the patient, who gave verbal consent to proceed.  History of Present Illness Miranda Thompson is an 89 year old female with bronchiectasis and pseudomonas colonization who presents for follow up.  She has intermittent shortness of breath and wheezing that family frequently notices, although it is not always present on exam. Sputum cultures have recently grown pseudomonas, consistent with her long-standing colonization. She was considered for IV antibiotics via PICC, but infectious disease recommended no antibiotics at that time. She recently completed a 10-day course of ciprofloxacin  for a urinary tract infection without pulmonary change and with resolution of urinary symptoms. She had a single episode of hemoptysis during postural drainage that has not recurred. She denies fever or chills.  She uses nebulized 0.9% saline twice daily, a daily Stiolto inhaler, and albuterol  as needed for shortness of breath. Her mucus volume has decreased and she is coughing up less sputum.  She has back pain from degenerative disc disease since August that limits mobility and is worsened by activity. There is question whether she can tolerate chest vest therapy due to her back disease.        ROS    Current Outpatient Medications:    albuterol  (PROVENTIL ) (2.5 MG/3ML) 0.083% nebulizer solution, Take 3 mLs (2.5 mg total) by nebulization every 6 (six) hours as needed for wheezing or shortness of breath., Disp: 75 mL, Rfl: 12   albuterol  (VENTOLIN  HFA) 108 (90 Base) MCG/ACT inhaler, TAKE 2 PUFFS BY MOUTH EVERY 6 HOURS AS NEEDED FOR WHEEZE OR SHORTNESS OF  BREATH, Disp: 8.5 each, Rfl: 6   amoxicillin  (AMOXIL ) 500 MG capsule, , Disp: , Rfl:    ASPIRIN  81 PO, Take 1 tablet by mouth daily., Disp: , Rfl:    Calcium  Carb-Cholecalciferol  600-800 MG-UNIT TABS, Take 1 tablet by mouth daily., Disp: , Rfl:    cyclobenzaprine  (FLEXERIL ) 5 MG tablet, Take 1 tablet (5 mg total) by mouth 3 (three) times daily as needed., Disp: 40 tablet, Rfl: 1   diltiazem  (CARDIZEM  CD) 120 MG 24 hr capsule, TAKE 1 CAPSULE BY MOUTH EVERY DAY, Disp: 90 capsule, Rfl: 3   guaiFENesin  (MUCINEX ) 600 MG 12 hr tablet, Take by mouth 2 (two) times daily., Disp: , Rfl:    HYDROcodone -acetaminophen  (NORCO/VICODIN) 5-325 MG tablet, Take 1 tablet by mouth every 6 (six) hours as needed., Disp: 30 tablet, Rfl: 0   losartan  (COZAAR ) 50 MG tablet, Take 1 tablet (50 mg total) by mouth daily., Disp: 90 tablet, Rfl: 3   LUMIGAN 0.01 % SOLN, , Disp: , Rfl:    Multiple Vitamins-Minerals (MULTIVITAMIN & MINERAL PO), Take 1 tablet by mouth daily., Disp: , Rfl:    nitroGLYCERIN  (NITROSTAT ) 0.4 MG SL tablet, PLACE 1 TABLET (0.4 MG TOTAL) UNDER THE TONGUE EVERY 5 (FIVE) MINUTES AS NEEDED FOR CHEST PAIN. PLEASE CALL [phone] TO MAKE APPOINTMENT WITH DR. ROSS FOR FUTURE REFILLS., Disp: 25 tablet, Rfl: 5   Respiratory Therapy Supplies (FLUTTER) DEVI, 1 application by Does not apply route 3 (three) times daily., Disp: 1 each, Rfl: 0   rosuvastatin  (CRESTOR ) 5 MG tablet, Take  1 tablet (5 mg total) by mouth daily., Disp: 90 tablet, Rfl: 2   Saccharomyces boulardii (FLORASTOR PO), Take 1 capsule by mouth daily., Disp: , Rfl:    sodium chloride  0.9 % nebulizer solution, Take 3 mLs by nebulization 2 (two) times daily as needed (cough/congestion)., Disp: 90 mL, Rfl: 12   Tiotropium Bromide-Olodaterol (STIOLTO RESPIMAT ) 2.5-2.5 MCG/ACT AERS, Inhale 2 puffs into the lungs daily., Disp: 12 g, Rfl: 3   TURMERIC CURCUMIN PO, Take by mouth., Disp: , Rfl:    VITAMIN D  PO, Take 4,000 Int'l Units by mouth., Disp: , Rfl:     Zinc 30 MG CAPS, , Disp: , Rfl:       Objective:  BP (!) 148/64   Pulse 84   Ht 5' 3 (1.6 m) Comment: per pt  Wt 128 lb (58.1 kg)   LMP 02/23/1985   SpO2 97%   BMI 22.67 kg/m     Physical Exam Constitutional:      General: She is not in acute distress.    Appearance: Normal appearance.  Eyes:     General: No scleral icterus.    Conjunctiva/sclera: Conjunctivae normal.  Cardiovascular:     Rate and Rhythm: Normal rate and regular rhythm.  Pulmonary:     Breath sounds: No wheezing, rhonchi or rales.  Musculoskeletal:     Right lower leg: No edema.     Left lower leg: No edema.  Skin:    General: Skin is warm and dry.  Neurological:     General: No focal deficit present.      Diagnostic Review:  Last CBC Lab Results  Component Value Date   WBC 4.7 12/23/2023   HGB 12.3 12/23/2023   HCT 35.6 (L) 12/23/2023   MCV 96.6 12/23/2023   MCH 33.6 12/20/2023   RDW 12.3 12/23/2023   PLT 283.0 12/23/2023   Last metabolic panel Lab Results  Component Value Date   GLUCOSE 95 12/23/2023   NA 127 (L) 12/23/2023   K 4.6 12/23/2023   CL 93 (L) 12/23/2023   CO2 27 12/23/2023   BUN 17 12/23/2023   CREATININE 0.58 12/23/2023   GFR 80.69 12/23/2023   CALCIUM  9.1 12/23/2023   PHOS 4.0 12/02/2020   PROT 7.8 12/23/2023   ALBUMIN 4.1 12/23/2023   BILITOT 0.6 12/23/2023   ALKPHOS 73 12/23/2023   AST 25 12/23/2023   ALT 15 12/23/2023   ANIONGAP 8 12/20/2023       Assessment & Plan:   Assessment & Plan Bronchiectasis without complication (HCC)     Pseudomonas aeruginosa colonization      Assessment and Plan Assessment & Plan Bronchiectasis with chronic Pseudomonas colonization Chronic bronchiectasis with Pseudomonas colonization. Recent sputum culture showed Pseudomonas, no antibiotics needed. Nebulizer treatments with saline reducing mucus. Discussed chest vest for mucus clearance, considering back issues. Manual percussion as alternative. - Continue  Stiolto daily and albuterol  as needed. - Use 0.9% saline nebulizer twice daily with flutter valve. - Check with chest vest representative regarding settings and contraindications for degenerative disc disease. - Consider manual percussion if chest vest not feasible.     Return in about 4 months (around 05/24/2024) for f/u visit Dr. Kara.   Dorn KATHEE Kara, MD

## 2024-01-29 LAB — MYCOBACTERIA,CULT W/FLUOROCHROME SMEAR
MICRO NUMBER:: 17133284
SMEAR:: NONE SEEN
SPECIMEN QUALITY:: ADEQUATE

## 2024-01-29 LAB — HOUSE ACCOUNT TRACKING

## 2024-01-29 LAB — RESPIRATORY CULTURE OR RESPIRATORY AND SPUTUM CULTURE
MICRO NUMBER:: 17133285
RESULT:: NORMAL
SPECIMEN QUALITY:: ADEQUATE

## 2024-02-01 ENCOUNTER — Encounter: Payer: Self-pay | Admitting: Pulmonary Disease

## 2024-02-08 ENCOUNTER — Telehealth: Payer: Self-pay

## 2024-02-09 ENCOUNTER — Ambulatory Visit: Admitting: Internal Medicine

## 2024-02-11 ENCOUNTER — Encounter: Payer: Self-pay | Admitting: Pharmacist

## 2024-02-11 NOTE — Progress Notes (Signed)
 Pharmacy Quality Measure Review  This patient is appearing on a report for being at risk of failing the adherence measure for cholesterol (statin) medications this calendar year.   Medication: Rosuvastatin   Last fill date: 01/19/24 for 90 day supply  Insurance report was not up to date. No action needed at this time.    Darrelyn Drum, PharmD, BCPS, CPP Clinical Pharmacist Practitioner [facility] at [facility] [phone]

## 2024-02-14 ENCOUNTER — Ambulatory Visit: Admitting: Pulmonary Disease

## 2024-02-14 NOTE — Telephone Encounter (Signed)
 Ok to order chest vest and recommend using it with caution. The vibrations could cause worsening in back pain so she is to monitor for any symptoms of back discomfort and if it develops she is to stop chest vest therapy.  JD

## 2024-03-24 ENCOUNTER — Encounter: Payer: Self-pay | Admitting: Internal Medicine

## 2024-03-24 ENCOUNTER — Ambulatory Visit: Attending: Internal Medicine | Admitting: Internal Medicine

## 2024-03-24 VITALS — BP 135/62 | HR 81 | Resp 17 | Ht 63.0 in | Wt 124.0 lb

## 2024-03-24 DIAGNOSIS — I2510 Atherosclerotic heart disease of native coronary artery without angina pectoris: Secondary | ICD-10-CM | POA: Diagnosis not present

## 2024-03-24 NOTE — Patient Instructions (Addendum)
 Medication Instructions:   No changes *If you need a refill on your cardiac medications before your next appointment, please call your pharmacy*   Lab Work: Not needed If you have labs (blood work) drawn today and your tests are completely normal, you will receive your results only by: MyChart Message (if you have MyChart) OR A paper copy in the mail If you have any lab test that is abnormal or we need to change your treatment, we will call you to review the results.   Testing/Procedures:  Not needed  Follow-Up: At [facility], you and your health needs are our priority.  As part of our continuing mission to provide you with exceptional heart care, we have created designated Provider Care Teams.  These Care Teams include your primary Cardiologist (physician) and Advanced Practice Providers (APPs -  Physician Assistants and Nurse Practitioners) who all work together to provide you with the care you need, when you need it.     Your next appointment:   8 month(s)  The format for your next appointment:   In Person  Provider:   Vina Gull, MD

## 2024-03-27 ENCOUNTER — Ambulatory Visit: Payer: Medicare PPO

## 2024-03-30 ENCOUNTER — Ambulatory Visit (HOSPITAL_BASED_OUTPATIENT_CLINIC_OR_DEPARTMENT_OTHER)

## 2024-03-31 ENCOUNTER — Ambulatory Visit

## 2024-03-31 VITALS — Ht 63.0 in | Wt 123.0 lb

## 2024-03-31 DIAGNOSIS — Z0000 Encounter for general adult medical examination without abnormal findings: Secondary | ICD-10-CM

## 2024-03-31 NOTE — Progress Notes (Signed)
 "  Chief Complaint  Patient presents with   Medicare Wellness     Subjective:   Miranda Thompson is a 89 y.o. female who presents for a Medicare Annual Wellness Visit.  Visit info / Clinical Intake: Medicare Wellness Visit Type:: Subsequent Annual Wellness Visit Persons participating in visit and providing information:: patient Medicare Wellness Visit Mode:: Video Since this visit was completed virtually, some vitals may be partially provided or unavailable. Missing vitals are due to the limitations of the virtual format.: Documented vitals are patient reported If Telephone or Video please confirm:: I connected with patient using audio/video enable telemedicine. I verified patient identity with two identifiers, discussed telehealth limitations, and patient agreed to proceed. Patient Location:: home Provider Location:: office Interpreter Needed?: No Pre-visit prep was completed: yes AWV questionnaire completed by patient prior to visit?: yes Date:: 03/30/24 Living arrangements:: (Patient-Rptd) lives with spouse/significant other Patient's Overall Health Status Rating: (Patient-Rptd) very good Typical amount of pain: (Patient-Rptd) some Does pain affect daily life?: (Patient-Rptd) no Are you currently prescribed opioids?: no  Dietary Habits and Nutritional Risks How many meals a day?: (Patient-Rptd) 2 Eats fruit and vegetables daily?: (Patient-Rptd) yes Most meals are obtained by: (Patient-Rptd) preparing own meals In the last 2 weeks, have you had any of the following?: none Diabetic:: no  Functional Status Activities of Daily Living (to include ambulation/medication): (Patient-Rptd) Independent Ambulation: (Patient-Rptd) Independent Home Assistive Devices/Equipment: Cane Medication Administration: (Patient-Rptd) Independent Home Management (perform basic housework or laundry): (Patient-Rptd) Independent Manage your own finances?: (Patient-Rptd) yes Primary transportation is:  (Patient-Rptd) driving; family / friends Concerns about vision?: (Patient-Rptd) no *vision screening is required for [facility]* Concerns about hearing?: (Patient-Rptd) no  Fall Screening Falls in the past year?: (Patient-Rptd) 0 Number of falls in past year: 0 Was there an injury with Fall?: 0 Fall Risk Category Calculator: 0 Patient Fall Risk Level: Low Fall Risk  Fall Risk Patient at Risk for Falls Due to: Medication side effect Fall risk Follow up: Falls prevention discussed; Education provided; Falls evaluation completed  Home and Transportation Safety: All rugs have non-skid backing?: (Patient-Rptd) yes All stairs or steps have railings?: (Patient-Rptd) yes Grab bars in the bathtub or shower?: (Patient-Rptd) yes Have non-skid surface in bathtub or shower?: (Patient-Rptd) yes Good home lighting?: (Patient-Rptd) yes Regular seat belt use?: (Patient-Rptd) yes Hospital stays in the last year:: (Patient-Rptd) no  Cognitive Assessment Difficulty concentrating, remembering, or making decisions? : (Patient-Rptd) no Will 6CIT or Mini Cog be Completed: yes What year is it?: 0 points What month is it?: 0 points Give patient an address phrase to remember (5 components): [address] [place] About what time is it?: 0 points Count backwards from 20 to 1: 0 points Say the months of the year in reverse: 0 points Repeat the address phrase from earlier: 6 points 6 CIT Score: 6 points  Advance Directives (For Healthcare) Does Patient Have a Medical Advance Directive?: Yes Type of Advance Directive: Healthcare Power of [place]; Living will Copy of Healthcare Power of Attorney in Chart?: No - copy requested Copy of Living Will in Chart?: No - copy requested  Reviewed/Updated  Reviewed/Updated: Reviewed All (Medical, Surgical, Family, Medications, Allergies, Care Teams, Patient Goals)    Allergies (verified) Tramadol    Current Medications (verified) Outpatient Encounter Medications as  of 03/31/2024  Medication Sig   albuterol  (PROVENTIL ) (2.5 MG/3ML) 0.083% nebulizer solution Take 3 mLs (2.5 mg total) by nebulization every 6 (six) hours as needed for wheezing or shortness of breath.  albuterol  (VENTOLIN  HFA) 108 (90 Base) MCG/ACT inhaler TAKE 2 PUFFS BY MOUTH EVERY 6 HOURS AS NEEDED FOR WHEEZE OR SHORTNESS OF BREATH   amoxicillin  (AMOXIL ) 500 MG capsule    ASPIRIN  81 PO Take 1 tablet by mouth daily.   Calcium  Carb-Cholecalciferol  600-800 MG-UNIT TABS Take 1 tablet by mouth daily.   diltiazem  (CARDIZEM  CD) 120 MG 24 hr capsule TAKE 1 CAPSULE BY MOUTH EVERY DAY   guaiFENesin  (MUCINEX ) 600 MG 12 hr tablet Take by mouth 2 (two) times daily.   losartan  (COZAAR ) 50 MG tablet Take 1 tablet (50 mg total) by mouth daily.   LUMIGAN 0.01 % SOLN    Multiple Vitamins-Minerals (MULTIVITAMIN & MINERAL PO) Take 1 tablet by mouth daily.   nitroGLYCERIN  (NITROSTAT ) 0.4 MG SL tablet PLACE 1 TABLET (0.4 MG TOTAL) UNDER THE TONGUE EVERY 5 (FIVE) MINUTES AS NEEDED FOR CHEST PAIN. PLEASE CALL [phone] TO MAKE APPOINTMENT WITH DR. ROSS FOR FUTURE REFILLS.   Respiratory Therapy Supplies (FLUTTER) DEVI 1 application by Does not apply route 3 (three) times daily.   rosuvastatin  (CRESTOR ) 5 MG tablet Take 1 tablet (5 mg total) by mouth daily.   Saccharomyces boulardii (FLORASTOR PO) Take 1 capsule by mouth daily.   sodium chloride  0.9 % nebulizer solution Take 3 mLs by nebulization 2 (two) times daily as needed (cough/congestion).   Tiotropium Bromide-Olodaterol (STIOLTO RESPIMAT ) 2.5-2.5 MCG/ACT AERS Inhale 2 puffs into the lungs daily.   VITAMIN D  PO Take 4,000 Int'l Units by mouth.   Zinc 30 MG CAPS    cyclobenzaprine  (FLEXERIL ) 5 MG tablet Take 1 tablet (5 mg total) by mouth 3 (three) times daily as needed. (Patient not taking: Reported on 03/24/2024)   HYDROcodone -acetaminophen  (NORCO/VICODIN) 5-325 MG tablet Take 1 tablet by mouth every 6 (six) hours as needed. (Patient not taking: Reported on  03/24/2024)   TURMERIC CURCUMIN PO Take by mouth. (Patient not taking: Reported on 03/24/2024)   No facility-administered encounter medications on file as of 03/31/2024.    History: Past Medical History:  Diagnosis Date   Adenomatous colon polyp    Allergic rhinitis    Anemia    pt states no anemia in her past hx thst she is aware of    Atrial fibrillation [facility])    goes in and out of this rhythm- takes Tenormin    Breast cancer (HCC) May, 1994   Breast cancer, left breast (HCC) 1994 with lumpectomy   chemo,tamox,radiation   Bronchiectasis with acute exacerbation [facility])    Cataract Dec 2018   Right eye cataract surgery 08/29/19   COPD (chronic obstructive pulmonary disease) (HCC)    Coronary artery disease    Diverticulosis of colon    DJD (degenerative joint disease)    Dysrhythmia    palpitations   Family history of brain cancer    Family history of colon cancer 02/14/2018   Family history of colon cancer    Family history of malignant neoplasm of gastrointestinal tract    Family history of melanoma    Family history of uterine cancer    Fibromyalgia    GERD (gastroesophageal reflux disease) 02/14/2018   Glaucoma    Hemoptysis    History of pneumonia    Hypercholesteremia    pt denies   Hypertension    Low back pain syndrome    Neuromuscular disorder (HCC)    hx fibromyalgia    Osteopenia    Pneumonia    hx   Pseudomonas infection    Rotator cuff syndrome  of right shoulder    Spinal stenosis    Past Surgical History:  Procedure Laterality Date   BREAST SURGERY  May 1994   CATARACT EXTRACTION Right    COLONOSCOPY     EYE SURGERY     JOINT REPLACEMENT  2016, 2018, 2018   knee, knee, shoulder   left breast lumpectomy and LN dissection  06/1992   Dr. Ethyl   LUNG BIOPSY  1968   TSurg---Dr. Vivian   LUNG BIOPSY  2012   dr christi   POLYPECTOMY     REVERSE SHOULDER ARTHROPLASTY Right 08/02/2015   Procedure: RIGHT REVERSE TOTAL SHOULDER ARTHROPLASTY;  Surgeon:  Marcey Her, MD;  Location[facility];  Service: Orthopedics;  Laterality: Right;   REVERSE TOTAL SHOULDER ARTHROPLASTY Right 08/02/2015   TONSILLECTOMY     TOTAL KNEE ARTHROPLASTY Right 01/21/2015   Procedure: TOTAL RIGHT KNEE ARTHROPLASTY;  Surgeon: Donnice Car, MD;  Location: [facility] ORS;  Service: Orthopedics;  Laterality: Right;   TOTAL KNEE ARTHROPLASTY Left 03/23/2016   Procedure: LEFT TOTAL KNEE ARTHROPLASTY;  Surgeon: Donnice Car, MD;  Location: [facility] ORS;  Service: Orthopedics;  Laterality: Left;   Family History  Problem Relation Age of Onset   Heart attack Mother        Deceased age 58   Hypertension Mother    Colon cancer Father 70       deceased age 42 from colon ca.   Cancer Father    Early death Father    Hypertension Brother    Stroke Maternal Grandmother    Arthritis Maternal Grandmother    Varicose Veins Maternal Grandmother    Colon polyps Daughter    Uterine cancer Daughter 68   Melanoma Daughter 40   Brain cancer Maternal Uncle    Other Paternal Uncle 12       possible cancer   Melanoma Niece 25       d. 45   Arthritis Brother    Hypertension Maternal Aunt    Hypertension Maternal Uncle    Esophageal cancer Neg Hx    Stomach cancer Neg Hx    Rectal cancer Neg Hx    Ovarian cancer Neg Hx    Prostate cancer Neg Hx    Breast cancer Neg Hx    Social History   Occupational History   Occupation: Retired  Tobacco Use   Smoking status: Former    Current packs/day: 0.00    Types: Cigarettes    Quit date: 02/24/1967    Years since quitting: 57.1   Smokeless tobacco: Never   Tobacco comments:    very rare cigarette use in college  Vaping Use   Vaping status: Never Used  Substance and Sexual Activity   Alcohol use: Not Currently    Comment: very infrequent glass of wine or beer.   Drug use: No   Sexual activity: Not Currently    Partners: Male    Birth control/protection: Post-menopausal    Comment: No   Tobacco Counseling Counseling given: Not  Answered Tobacco comments: very rare cigarette use in college  SDOH Screenings   Food Insecurity: No Food Insecurity (03/30/2024)  Housing: Low Risk (03/30/2024)  Transportation Needs: No Transportation Needs (03/30/2024)  Utilities: Not At Risk (03/31/2024)  Alcohol Screen: Low Risk (03/30/2024)  Depression (PHQ2-9): Low Risk (03/31/2024)  Financial Resource Strain: Low Risk (03/30/2024)  Physical Activity: Sufficiently Active (03/30/2024)  Social Connections: Unknown (03/30/2024)  Stress: Patient Declined (03/30/2024)  Tobacco Use: Medium Risk (03/31/2024)  Health Literacy: Adequate  Health Literacy (03/31/2024)   See flowsheets for full screening details  Depression Screen PHQ 2 & 9 Depression Scale- Over the past 2 weeks, how often have you been bothered by any of the following problems? Little interest or pleasure in doing things: 0 Feeling down, depressed, or hopeless (PHQ Adolescent also includes...irritable): 0 PHQ-2 Total Score: 0 Trouble falling or staying asleep, or sleeping too much: 0 Feeling tired or having little energy: 3 (has COPD) Poor appetite or overeating (PHQ Adolescent also includes...weight loss): 0 Feeling bad about yourself - or that you are a failure or have let yourself or your family down: 0 Trouble concentrating on things, such as reading the newspaper or watching television (PHQ Adolescent also includes...like school work): 0 Moving or speaking so slowly that other people could have noticed. Or the opposite - being so fidgety or restless that you have been moving around a lot more than usual: 0 Thoughts that you would be better off dead, or of hurting yourself in some way: 0 PHQ-9 Total Score: 3 If you checked off any problems, how difficult have these problems made it for you to do your work, take care of things at home, or get along with other people?: Not difficult at all  Depression Treatment Depression Interventions/Treatment : EYV7-0 Score <4 Follow-up Not  Indicated     Goals Addressed               This Visit's Progress     stay alive (pt-stated)               Objective:    Today's Vitals   03/31/24 0927  Weight: 123 lb (55.8 kg)  Height: 5' 3 (1.6 m)   Body mass index is 21.79 kg/m.  Hearing/Vision screen Vision Screening - Comments:: Regular eye exams Immunizations and Health Maintenance Health Maintenance  Topic Date Due   Colonoscopy  11/08/2024 (Originally 08/08/2019)   DTaP/Tdap/Td (2 - Td or Tdap) 05/08/2024   Medicare Annual Wellness (AWV)  03/31/2025   Pneumococcal Vaccine: 50+ Years  Completed   Influenza Vaccine  Completed   Bone Density Scan  Completed   Zoster Vaccines- Shingrix  Completed   Meningococcal B Vaccine  Aged Out   COVID-19 Vaccine  Discontinued        Assessment/Plan:  This is a routine wellness examination for Miranda Thompson.  Patient Care Team: Alvia Corean CROME, FNP as PCP - General (Family Medicine) Okey Vina GAILS, MD as PCP - Cardiology (Cardiology) Patrcia Sharper, MD as Consulting Physician (Ophthalmology) Kara Dorn NOVAK, MD as Consulting Physician (Pulmonary Disease)  I have personally reviewed and noted the following in the patients chart:   Medical and social history Use of alcohol, tobacco or illicit drugs  Current medications and supplements including opioid prescriptions. Functional ability and status Nutritional status Physical activity Advanced directives List of other physicians Hospitalizations, surgeries, and ER visits in previous 12 months Vitals Screenings to include cognitive, depression, and falls Referrals and appointments  No orders of the defined types were placed in this encounter.  In addition, I have reviewed and discussed with patient certain preventive protocols, quality metrics, and best practice recommendations. A written personalized care plan for preventive services as well as general preventive health recommendations were provided to  patient.   Ardella FORBES Dawn, LPN   08/26/7971   Return in 1 year (on 03/31/2025).  After Visit Summary: (MyChart) Due to this being a telephonic visit, the after visit summary with patients personalized plan was  offered to patient via MyChart   Nurse Notes: No voiced or noted concerns at this time  "

## 2024-03-31 NOTE — Patient Instructions (Signed)
 Miranda Thompson,  Thank you for taking the time for your Medicare Wellness Visit. I appreciate your continued commitment to your health goals. Please review the care plan we discussed, and feel free to reach out if I can assist you further.  Please note that Annual Wellness Visits do not include a physical exam. Some assessments may be limited, especially if the visit was conducted virtually. If needed, we may recommend an in-person follow-up with your provider.  Ongoing Care Seeing your primary care provider every 3 to 6 months helps us  monitor your health and provide consistent, personalized care.  Referrals If a referral was made during today's visit and you haven't received any updates within two weeks, please contact the referred provider directly to check on the status.  Recommended Screenings:  Health Maintenance  Topic Date Due   Medicare Annual Wellness Visit  03/23/2024   Colon Cancer Screening  11/08/2024*   DTaP/Tdap/Td vaccine (2 - Td or Tdap) 05/08/2024   Pneumococcal Vaccine for age over 29  Completed   Flu Shot  Completed   Osteoporosis screening with Bone Density Scan  Completed   Zoster (Shingles) Vaccine  Completed   Meningitis B Vaccine  Aged Out   COVID-19 Vaccine  Discontinued  *Topic was postponed. The date shown is not the original due date.       03/30/2024   10:18 PM  Advanced Directives  Does Patient Have a Medical Advance Directive? Yes  Type of Estate Agent of [place];Living will  Copy of Healthcare Power of Attorney in Chart? No - copy requested    Vision: Annual vision screenings are recommended for early detection of glaucoma, cataracts, and diabetic retinopathy. These exams can also reveal signs of chronic conditions such as diabetes and high blood pressure.  Dental: Annual dental screenings help detect early signs of oral cancer, gum disease, and other conditions linked to overall health, including heart disease and  diabetes.  Please see the attached documents for additional preventive care recommendations.

## 2024-04-05 ENCOUNTER — Ambulatory Visit (HOSPITAL_BASED_OUTPATIENT_CLINIC_OR_DEPARTMENT_OTHER)

## 2024-04-24 ENCOUNTER — Ambulatory Visit (HOSPITAL_BASED_OUTPATIENT_CLINIC_OR_DEPARTMENT_OTHER)

## 2024-05-16 ENCOUNTER — Ambulatory Visit: Admitting: Pulmonary Disease

## 2024-07-24 ENCOUNTER — Ambulatory Visit: Admitting: Family Medicine

## 2024-07-25 ENCOUNTER — Ambulatory Visit: Admitting: Family Medicine

## 2024-09-25 ENCOUNTER — Other Ambulatory Visit (HOSPITAL_COMMUNITY)

## 2025-04-06 ENCOUNTER — Ambulatory Visit
